# Patient Record
Sex: Male | Born: 1965 | Race: White | Hispanic: No | Marital: Single | Smoking: Former smoker
Health system: Southern US, Community
[De-identification: ages and names within clinical notes are randomized; demographics above are authoritative.]

## PROBLEM LIST (undated history)

## (undated) DIAGNOSIS — F329 Major depressive disorder, single episode, unspecified: Secondary | ICD-10-CM

## (undated) DIAGNOSIS — S62629A Displaced fracture of medial phalanx of unspecified finger, initial encounter for closed fracture: Secondary | ICD-10-CM

## (undated) DIAGNOSIS — K7030 Alcoholic cirrhosis of liver without ascites: Secondary | ICD-10-CM

## (undated) DIAGNOSIS — C4490 Unspecified malignant neoplasm of skin, unspecified: Secondary | ICD-10-CM

## (undated) DIAGNOSIS — F411 Generalized anxiety disorder: Secondary | ICD-10-CM

## (undated) DIAGNOSIS — N529 Male erectile dysfunction, unspecified: Secondary | ICD-10-CM

## (undated) DIAGNOSIS — T424X2A Poisoning by benzodiazepines, intentional self-harm, initial encounter: Secondary | ICD-10-CM

## (undated) DIAGNOSIS — K7290 Hepatic failure, unspecified without coma: Secondary | ICD-10-CM

## (undated) DIAGNOSIS — J45909 Unspecified asthma, uncomplicated: Secondary | ICD-10-CM

## (undated) DIAGNOSIS — E7220 Disorder of urea cycle metabolism, unspecified: Secondary | ICD-10-CM

## (undated) DIAGNOSIS — K219 Gastro-esophageal reflux disease without esophagitis: Secondary | ICD-10-CM

## (undated) DIAGNOSIS — S4990XA Unspecified injury of shoulder and upper arm, unspecified arm, initial encounter: Secondary | ICD-10-CM

## (undated) DIAGNOSIS — G9009 Other idiopathic peripheral autonomic neuropathy: Secondary | ICD-10-CM

## (undated) DIAGNOSIS — D509 Iron deficiency anemia, unspecified: Secondary | ICD-10-CM

## (undated) DIAGNOSIS — I8510 Secondary esophageal varices without bleeding: Secondary | ICD-10-CM

## (undated) DIAGNOSIS — L03115 Cellulitis of right lower limb: Secondary | ICD-10-CM

## (undated) DIAGNOSIS — IMO0002 Reserved for concepts with insufficient information to code with codable children: Secondary | ICD-10-CM

## (undated) DIAGNOSIS — I878 Other specified disorders of veins: Secondary | ICD-10-CM

## (undated) DIAGNOSIS — F332 Major depressive disorder, recurrent severe without psychotic features: Secondary | ICD-10-CM

## (undated) DIAGNOSIS — T7411XA Adult physical abuse, confirmed, initial encounter: Secondary | ICD-10-CM

## (undated) DIAGNOSIS — I10 Essential (primary) hypertension: Secondary | ICD-10-CM

## (undated) DIAGNOSIS — E669 Obesity, unspecified: Secondary | ICD-10-CM

## (undated) DIAGNOSIS — D759 Disease of blood and blood-forming organs, unspecified: Secondary | ICD-10-CM

## (undated) DIAGNOSIS — K652 Spontaneous bacterial peritonitis: Secondary | ICD-10-CM

## (undated) DIAGNOSIS — R45851 Suicidal ideations: Secondary | ICD-10-CM

## (undated) DIAGNOSIS — F04 Amnestic disorder due to known physiological condition: Secondary | ICD-10-CM

## (undated) DIAGNOSIS — F4321 Adjustment disorder with depressed mood: Secondary | ICD-10-CM

## (undated) DIAGNOSIS — I8511 Secondary esophageal varices with bleeding: Secondary | ICD-10-CM

## (undated) DIAGNOSIS — K3189 Other diseases of stomach and duodenum: Secondary | ICD-10-CM

## (undated) DIAGNOSIS — B20 Human immunodeficiency virus [HIV] disease: Secondary | ICD-10-CM

## (undated) DIAGNOSIS — K766 Portal hypertension: Secondary | ICD-10-CM

## (undated) DIAGNOSIS — I8500 Esophageal varices without bleeding: Secondary | ICD-10-CM

## (undated) DIAGNOSIS — R188 Other ascites: Secondary | ICD-10-CM

## (undated) DIAGNOSIS — F1020 Alcohol dependence, uncomplicated: Secondary | ICD-10-CM

## (undated) DIAGNOSIS — J320 Chronic maxillary sinusitis: Secondary | ICD-10-CM

## (undated) DIAGNOSIS — L089 Local infection of the skin and subcutaneous tissue, unspecified: Secondary | ICD-10-CM

## (undated) DIAGNOSIS — E785 Hyperlipidemia, unspecified: Secondary | ICD-10-CM

## (undated) HISTORY — DX: Disorder of urea cycle metabolism, unspecified: E72.20

## (undated) HISTORY — DX: Alcoholic cirrhosis of liver without ascites: K70.30

## (undated) HISTORY — DX: Cellulitis of right lower limb: L03.115

## (undated) HISTORY — DX: Unspecified asthma, uncomplicated: J45.909

## (undated) HISTORY — DX: Hepatic failure, unspecified without coma: K72.90

## (undated) HISTORY — DX: Local infection of the skin and subcutaneous tissue, unspecified: L08.9

## (undated) HISTORY — PX: CARPAL TUNNEL RELEASE: SHX101

## (undated) HISTORY — PX: ESOPHAGOGASTRODUODENOSCOPY: SHX1529

## (undated) HISTORY — DX: Reserved for concepts with insufficient information to code with codable children: IMO0002

## (undated) HISTORY — DX: Human immunodeficiency virus (HIV) disease: B20

## (undated) HISTORY — DX: Iron deficiency anemia, unspecified: D50.9

## (undated) HISTORY — DX: Displaced fracture of middle phalanx of unspecified finger, initial encounter for closed fracture: S62.629A

## (undated) HISTORY — DX: Major depressive disorder, single episode, unspecified: F32.9

## (undated) HISTORY — DX: Alcohol dependence, uncomplicated: F10.20

## (undated) HISTORY — DX: Unspecified malignant neoplasm of skin, unspecified: C44.90

## (undated) HISTORY — DX: Secondary esophageal varices with bleeding: I85.11

## (undated) HISTORY — DX: Other specified disorders of veins: I87.8

## (undated) HISTORY — DX: Unspecified injury of shoulder and upper arm, unspecified arm, initial encounter: S49.90XA

## (undated) HISTORY — DX: Esophageal varices without bleeding: I85.00

## (undated) HISTORY — DX: Amnestic disorder due to known physiological condition: F04

## (undated) HISTORY — DX: Essential (primary) hypertension: I10

## (undated) HISTORY — DX: Suicidal ideations: R45.851

## (undated) HISTORY — DX: Hyperlipidemia, unspecified: E78.5

## (undated) HISTORY — DX: Chronic maxillary sinusitis: J32.0

## (undated) HISTORY — DX: Poisoning by benzodiazepines, intentional self-harm, initial encounter: T42.4X2A

## (undated) HISTORY — DX: Major depressive disorder, recurrent severe without psychotic features: F33.2

## (undated) HISTORY — DX: Male erectile dysfunction, unspecified: N52.9

## (undated) HISTORY — DX: Obesity, unspecified: E66.9

## (undated) HISTORY — DX: Generalized anxiety disorder: F41.1

## (undated) HISTORY — PX: UPPER GASTROINTESTINAL ENDOSCOPY: SHX188

## (undated) HISTORY — DX: Other idiopathic peripheral autonomic neuropathy: G90.09

## (undated) HISTORY — DX: Secondary esophageal varices without bleeding: I85.10

---
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
## 2002-07-03 ENCOUNTER — Encounter (INDEPENDENT_AMBULATORY_CARE_PROVIDER_SITE_OTHER): Payer: Self-pay | Admitting: *Deleted

## 2002-07-03 LAB — CONVERTED CEMR LAB
CD4 Count: 250 microliters
CD4 T Cell Abs: 250

## 2002-10-04 ENCOUNTER — Ambulatory Visit (HOSPITAL_BASED_OUTPATIENT_CLINIC_OR_DEPARTMENT_OTHER): Admission: RE | Admit: 2002-10-04 | Discharge: 2002-10-04 | Payer: Self-pay | Admitting: Orthopedic Surgery

## 2003-04-16 ENCOUNTER — Encounter: Admission: RE | Admit: 2003-04-16 | Discharge: 2003-04-16 | Payer: Self-pay | Admitting: Infectious Diseases

## 2003-04-16 ENCOUNTER — Encounter: Payer: Self-pay | Admitting: Infectious Diseases

## 2003-04-16 ENCOUNTER — Ambulatory Visit (HOSPITAL_COMMUNITY): Admission: RE | Admit: 2003-04-16 | Discharge: 2003-04-16 | Payer: Self-pay | Admitting: Infectious Diseases

## 2003-05-14 ENCOUNTER — Encounter: Admission: RE | Admit: 2003-05-14 | Discharge: 2003-05-14 | Payer: Self-pay | Admitting: Infectious Diseases

## 2003-08-26 ENCOUNTER — Encounter: Admission: RE | Admit: 2003-08-26 | Discharge: 2003-08-26 | Payer: Self-pay | Admitting: Infectious Diseases

## 2003-08-26 ENCOUNTER — Ambulatory Visit (HOSPITAL_COMMUNITY): Admission: RE | Admit: 2003-08-26 | Discharge: 2003-08-26 | Payer: Self-pay | Admitting: Infectious Diseases

## 2003-10-01 ENCOUNTER — Encounter: Admission: RE | Admit: 2003-10-01 | Discharge: 2003-10-01 | Payer: Self-pay | Admitting: Internal Medicine

## 2003-11-21 ENCOUNTER — Encounter: Admission: RE | Admit: 2003-11-21 | Discharge: 2003-11-21 | Payer: Self-pay | Admitting: Infectious Diseases

## 2003-11-21 ENCOUNTER — Ambulatory Visit (HOSPITAL_COMMUNITY): Admission: RE | Admit: 2003-11-21 | Discharge: 2003-11-21 | Payer: Self-pay | Admitting: Infectious Diseases

## 2003-12-06 ENCOUNTER — Encounter: Admission: RE | Admit: 2003-12-06 | Discharge: 2003-12-06 | Payer: Self-pay | Admitting: Infectious Diseases

## 2003-12-23 ENCOUNTER — Ambulatory Visit (HOSPITAL_COMMUNITY): Admission: RE | Admit: 2003-12-23 | Discharge: 2003-12-23 | Payer: Self-pay | Admitting: Infectious Diseases

## 2004-02-10 ENCOUNTER — Encounter: Admission: RE | Admit: 2004-02-10 | Discharge: 2004-02-10 | Payer: Self-pay | Admitting: Infectious Diseases

## 2004-02-10 ENCOUNTER — Ambulatory Visit (HOSPITAL_COMMUNITY): Admission: RE | Admit: 2004-02-10 | Discharge: 2004-02-10 | Payer: Self-pay | Admitting: Infectious Diseases

## 2004-02-20 ENCOUNTER — Ambulatory Visit: Payer: Self-pay | Admitting: Infectious Diseases

## 2004-04-15 ENCOUNTER — Ambulatory Visit: Payer: Self-pay | Admitting: Infectious Diseases

## 2004-04-15 ENCOUNTER — Ambulatory Visit (HOSPITAL_COMMUNITY): Admission: RE | Admit: 2004-04-15 | Discharge: 2004-04-15 | Payer: Self-pay | Admitting: Infectious Diseases

## 2004-05-06 ENCOUNTER — Ambulatory Visit: Payer: Self-pay | Admitting: Internal Medicine

## 2004-05-15 ENCOUNTER — Ambulatory Visit: Payer: Self-pay | Admitting: Infectious Diseases

## 2004-06-22 ENCOUNTER — Ambulatory Visit: Payer: Self-pay | Admitting: Internal Medicine

## 2004-07-24 ENCOUNTER — Ambulatory Visit: Payer: Self-pay | Admitting: Infectious Diseases

## 2004-08-17 ENCOUNTER — Ambulatory Visit: Payer: Self-pay | Admitting: Internal Medicine

## 2004-08-19 ENCOUNTER — Ambulatory Visit (HOSPITAL_COMMUNITY): Admission: RE | Admit: 2004-08-19 | Discharge: 2004-08-19 | Payer: Self-pay | Admitting: Infectious Diseases

## 2004-08-19 ENCOUNTER — Ambulatory Visit: Payer: Self-pay | Admitting: Infectious Diseases

## 2004-10-01 ENCOUNTER — Ambulatory Visit: Payer: Self-pay | Admitting: Infectious Diseases

## 2004-10-16 ENCOUNTER — Ambulatory Visit: Payer: Self-pay | Admitting: Internal Medicine

## 2005-01-20 ENCOUNTER — Ambulatory Visit (HOSPITAL_COMMUNITY): Admission: RE | Admit: 2005-01-20 | Discharge: 2005-01-20 | Payer: Self-pay | Admitting: Infectious Diseases

## 2005-01-20 ENCOUNTER — Ambulatory Visit: Payer: Self-pay | Admitting: Infectious Diseases

## 2005-01-25 ENCOUNTER — Ambulatory Visit: Payer: Self-pay | Admitting: Internal Medicine

## 2005-02-05 ENCOUNTER — Ambulatory Visit: Payer: Self-pay | Admitting: Infectious Diseases

## 2005-02-19 ENCOUNTER — Ambulatory Visit: Payer: Self-pay | Admitting: Internal Medicine

## 2005-04-08 ENCOUNTER — Ambulatory Visit: Payer: Self-pay | Admitting: Internal Medicine

## 2005-04-12 ENCOUNTER — Ambulatory Visit (HOSPITAL_COMMUNITY): Admission: RE | Admit: 2005-04-12 | Discharge: 2005-04-12 | Payer: Self-pay | Admitting: Infectious Diseases

## 2005-04-12 ENCOUNTER — Ambulatory Visit: Payer: Self-pay | Admitting: Infectious Diseases

## 2005-05-11 ENCOUNTER — Ambulatory Visit: Payer: Self-pay | Admitting: Infectious Diseases

## 2005-05-12 ENCOUNTER — Ambulatory Visit: Payer: Self-pay | Admitting: Internal Medicine

## 2005-06-04 ENCOUNTER — Ambulatory Visit: Payer: Self-pay | Admitting: Family Medicine

## 2005-08-09 ENCOUNTER — Ambulatory Visit: Payer: Self-pay | Admitting: Internal Medicine

## 2005-08-16 ENCOUNTER — Ambulatory Visit: Payer: Self-pay | Admitting: Internal Medicine

## 2005-09-02 ENCOUNTER — Encounter (INDEPENDENT_AMBULATORY_CARE_PROVIDER_SITE_OTHER): Payer: Self-pay | Admitting: *Deleted

## 2005-09-02 ENCOUNTER — Ambulatory Visit: Payer: Self-pay | Admitting: Infectious Diseases

## 2005-09-02 ENCOUNTER — Encounter: Admission: RE | Admit: 2005-09-02 | Discharge: 2005-09-02 | Payer: Self-pay | Admitting: Infectious Diseases

## 2005-09-02 LAB — CONVERTED CEMR LAB
CD4 Count: 410 microliters
HIV 1 RNA Quant: 399 copies/mL

## 2005-09-14 ENCOUNTER — Encounter: Payer: Self-pay | Admitting: Internal Medicine

## 2005-09-14 ENCOUNTER — Ambulatory Visit: Payer: Self-pay | Admitting: Internal Medicine

## 2005-10-19 ENCOUNTER — Ambulatory Visit: Payer: Self-pay | Admitting: Internal Medicine

## 2005-10-28 ENCOUNTER — Ambulatory Visit: Payer: Self-pay | Admitting: Infectious Diseases

## 2006-01-04 ENCOUNTER — Ambulatory Visit: Payer: Self-pay | Admitting: Internal Medicine

## 2006-01-12 ENCOUNTER — Encounter (INDEPENDENT_AMBULATORY_CARE_PROVIDER_SITE_OTHER): Payer: Self-pay | Admitting: *Deleted

## 2006-01-12 ENCOUNTER — Encounter: Admission: RE | Admit: 2006-01-12 | Discharge: 2006-01-12 | Payer: Self-pay | Admitting: Infectious Diseases

## 2006-01-12 ENCOUNTER — Ambulatory Visit: Payer: Self-pay | Admitting: Infectious Diseases

## 2006-01-12 LAB — CONVERTED CEMR LAB
CD4 Count: 380 microliters
HIV 1 RNA Quant: 531 copies/mL

## 2006-01-27 ENCOUNTER — Ambulatory Visit: Payer: Self-pay | Admitting: Infectious Diseases

## 2006-02-24 ENCOUNTER — Ambulatory Visit: Payer: Self-pay | Admitting: Internal Medicine

## 2006-03-29 ENCOUNTER — Ambulatory Visit: Payer: Self-pay | Admitting: Internal Medicine

## 2006-03-31 ENCOUNTER — Ambulatory Visit: Payer: Self-pay | Admitting: Internal Medicine

## 2006-04-25 ENCOUNTER — Ambulatory Visit: Payer: Self-pay | Admitting: Internal Medicine

## 2006-05-09 ENCOUNTER — Ambulatory Visit: Payer: Self-pay | Admitting: Infectious Diseases

## 2006-05-09 ENCOUNTER — Encounter: Admission: RE | Admit: 2006-05-09 | Discharge: 2006-05-09 | Payer: Self-pay | Admitting: Infectious Diseases

## 2006-05-09 ENCOUNTER — Encounter (INDEPENDENT_AMBULATORY_CARE_PROVIDER_SITE_OTHER): Payer: Self-pay | Admitting: *Deleted

## 2006-05-09 LAB — CONVERTED CEMR LAB
ALT: 29 units/L (ref 0–53)
AST: 20 units/L (ref 0–37)
Albumin: 4.8 g/dL (ref 3.5–5.2)
Alkaline Phosphatase: 95 units/L (ref 39–117)
BUN: 17 mg/dL (ref 6–23)
Basophils Absolute: 0 10*3/uL (ref 0.0–0.1)
Basophils Relative: 1 % (ref 0–1)
CD4 Count: 450 microliters
CO2: 21 meq/L (ref 19–32)
Calcium: 9.2 mg/dL (ref 8.4–10.5)
Chloride: 102 meq/L (ref 96–112)
Creatinine, Ser: 0.98 mg/dL (ref 0.40–1.50)
Eosinophils Relative: 2 % (ref 0–4)
Glucose, Bld: 82 mg/dL (ref 70–99)
HCT: 45.6 % (ref 41.0–49.0)
HIV 1 RNA Quant: 228 copies/mL
HIV 1 RNA Quant: 228 copies/mL — ABNORMAL HIGH (ref <50)
HIV-1 RNA Quant, Log: 2.36 — ABNORMAL HIGH (ref <1.70)
Hemoglobin: 15.8 g/dL (ref 13.9–16.8)
Lymphocytes Relative: 30 % (ref 15–43)
Lymphs Abs: 1.7 10*3/uL (ref 0.8–3.1)
MCHC: 34.6 g/dL (ref 33.1–35.4)
MCV: 90.8 fL (ref 78.8–100.0)
Monocytes Absolute: 0.5 10*3/uL (ref 0.2–0.7)
Monocytes Relative: 9 % (ref 3–11)
Neutro Abs: 3.2 10*3/uL (ref 1.8–6.8)
Neutrophils Relative %: 58 % (ref 47–77)
Platelets: 191 10*3/uL (ref 152–374)
Potassium: 4.2 meq/L (ref 3.5–5.3)
RBC: 5.02 M/uL (ref 4.20–5.50)
RDW: 12.9 % (ref 11.5–15.3)
Sodium: 137 meq/L (ref 135–145)
Total Bilirubin: 0.5 mg/dL (ref 0.3–1.2)
Total Protein: 6.8 g/dL (ref 6.0–8.3)
WBC: 5.5 10*3/uL (ref 3.7–10.0)

## 2006-05-26 ENCOUNTER — Ambulatory Visit: Payer: Self-pay | Admitting: Infectious Diseases

## 2006-06-23 DIAGNOSIS — B20 Human immunodeficiency virus [HIV] disease: Secondary | ICD-10-CM | POA: Insufficient documentation

## 2006-06-23 DIAGNOSIS — F411 Generalized anxiety disorder: Secondary | ICD-10-CM

## 2006-06-23 DIAGNOSIS — F32A Depression, unspecified: Secondary | ICD-10-CM

## 2006-06-23 DIAGNOSIS — K2100 Gastro-esophageal reflux disease with esophagitis, without bleeding: Secondary | ICD-10-CM

## 2006-06-23 DIAGNOSIS — E785 Hyperlipidemia, unspecified: Secondary | ICD-10-CM | POA: Insufficient documentation

## 2006-06-23 DIAGNOSIS — I10 Essential (primary) hypertension: Secondary | ICD-10-CM

## 2006-06-23 DIAGNOSIS — F329 Major depressive disorder, single episode, unspecified: Secondary | ICD-10-CM

## 2006-06-23 HISTORY — DX: Human immunodeficiency virus (HIV) disease: B20

## 2006-06-23 HISTORY — DX: Hyperlipidemia, unspecified: E78.5

## 2006-06-23 HISTORY — DX: Generalized anxiety disorder: F41.1

## 2006-06-23 HISTORY — DX: Essential (primary) hypertension: I10

## 2006-06-23 HISTORY — DX: Major depressive disorder, single episode, unspecified: F32.9

## 2006-06-23 HISTORY — DX: Depression, unspecified: F32.A

## 2006-06-23 HISTORY — DX: Gastro-esophageal reflux disease with esophagitis, without bleeding: K21.00

## 2006-06-27 ENCOUNTER — Ambulatory Visit: Payer: Self-pay | Admitting: Internal Medicine

## 2006-07-11 ENCOUNTER — Encounter: Payer: Self-pay | Admitting: Infectious Diseases

## 2006-08-08 ENCOUNTER — Encounter (INDEPENDENT_AMBULATORY_CARE_PROVIDER_SITE_OTHER): Payer: Self-pay | Admitting: *Deleted

## 2006-08-08 LAB — CONVERTED CEMR LAB

## 2006-08-21 ENCOUNTER — Encounter (INDEPENDENT_AMBULATORY_CARE_PROVIDER_SITE_OTHER): Payer: Self-pay | Admitting: *Deleted

## 2006-08-25 ENCOUNTER — Ambulatory Visit: Payer: Self-pay | Admitting: Infectious Diseases

## 2006-08-25 ENCOUNTER — Encounter: Admission: RE | Admit: 2006-08-25 | Discharge: 2006-08-25 | Payer: Self-pay | Admitting: Infectious Diseases

## 2006-09-08 ENCOUNTER — Ambulatory Visit: Payer: Self-pay | Admitting: Infectious Diseases

## 2006-09-09 ENCOUNTER — Encounter: Payer: Self-pay | Admitting: Infectious Diseases

## 2006-09-19 ENCOUNTER — Ambulatory Visit: Payer: Self-pay | Admitting: Internal Medicine

## 2006-10-10 ENCOUNTER — Ambulatory Visit: Payer: Self-pay | Admitting: Internal Medicine

## 2006-11-09 ENCOUNTER — Telehealth: Payer: Self-pay | Admitting: Infectious Diseases

## 2007-03-06 ENCOUNTER — Ambulatory Visit: Payer: Self-pay | Admitting: Internal Medicine

## 2007-03-06 DIAGNOSIS — J320 Chronic maxillary sinusitis: Secondary | ICD-10-CM | POA: Insufficient documentation

## 2007-03-06 HISTORY — DX: Chronic maxillary sinusitis: J32.0

## 2007-03-15 DIAGNOSIS — IMO0002 Reserved for concepts with insufficient information to code with codable children: Secondary | ICD-10-CM

## 2007-03-15 HISTORY — DX: Reserved for concepts with insufficient information to code with codable children: IMO0002

## 2007-03-20 ENCOUNTER — Ambulatory Visit: Payer: Self-pay | Admitting: Internal Medicine

## 2007-04-21 ENCOUNTER — Ambulatory Visit: Payer: Self-pay | Admitting: Family Medicine

## 2007-04-22 ENCOUNTER — Emergency Department (HOSPITAL_COMMUNITY): Admission: EM | Admit: 2007-04-22 | Discharge: 2007-04-22 | Payer: Self-pay | Admitting: Family Medicine

## 2007-05-08 ENCOUNTER — Encounter: Admission: RE | Admit: 2007-05-08 | Discharge: 2007-05-08 | Payer: Self-pay | Admitting: Infectious Diseases

## 2007-05-08 ENCOUNTER — Ambulatory Visit: Payer: Self-pay | Admitting: Infectious Diseases

## 2007-05-08 LAB — CONVERTED CEMR LAB
ALT: 34 units/L (ref 0–53)
AST: 23 units/L (ref 0–37)
Albumin: 4.6 g/dL (ref 3.5–5.2)
Alkaline Phosphatase: 72 units/L (ref 39–117)
BUN: 14 mg/dL (ref 6–23)
Basophils Absolute: 0.1 10*3/uL (ref 0.0–0.1)
Basophils Relative: 1 % (ref 0–1)
CO2: 22 meq/L (ref 19–32)
Calcium: 9.7 mg/dL (ref 8.4–10.5)
Chloride: 104 meq/L (ref 96–112)
Creatinine, Ser: 0.88 mg/dL (ref 0.40–1.50)
Eosinophils Absolute: 0.1 10*3/uL — ABNORMAL LOW (ref 0.2–0.7)
Eosinophils Relative: 3 % (ref 0–5)
Glucose, Bld: 116 mg/dL — ABNORMAL HIGH (ref 70–99)
HCT: 50.2 % (ref 39.0–52.0)
HIV 1 RNA Quant: 147 copies/mL — ABNORMAL HIGH (ref <50)
HIV-1 RNA Quant, Log: 2.17 — ABNORMAL HIGH (ref <1.70)
Hemoglobin: 16.7 g/dL (ref 13.0–17.0)
Lymphocytes Relative: 37 % (ref 12–46)
Lymphs Abs: 1.8 10*3/uL (ref 0.7–4.0)
MCHC: 33.3 g/dL (ref 30.0–36.0)
MCV: 92.8 fL (ref 78.0–100.0)
Monocytes Absolute: 0.3 10*3/uL (ref 0.1–1.0)
Monocytes Relative: 6 % (ref 3–12)
Neutro Abs: 2.6 10*3/uL (ref 1.7–7.7)
Neutrophils Relative %: 53 % (ref 43–77)
Platelets: 215 10*3/uL (ref 150–400)
Potassium: 4.3 meq/L (ref 3.5–5.3)
RBC: 5.41 M/uL (ref 4.22–5.81)
RDW: 13.5 % (ref 11.5–15.5)
Sodium: 141 meq/L (ref 135–145)
Total Bilirubin: 0.4 mg/dL (ref 0.3–1.2)
Total Protein: 7.3 g/dL (ref 6.0–8.3)
WBC: 4.9 10*3/uL (ref 4.0–10.5)

## 2007-05-25 ENCOUNTER — Ambulatory Visit: Payer: Self-pay | Admitting: Infectious Diseases

## 2007-05-25 DIAGNOSIS — G9009 Other idiopathic peripheral autonomic neuropathy: Secondary | ICD-10-CM | POA: Insufficient documentation

## 2007-05-25 HISTORY — DX: Other idiopathic peripheral autonomic neuropathy: G90.09

## 2007-06-13 ENCOUNTER — Encounter: Payer: Self-pay | Admitting: Infectious Diseases

## 2007-06-19 ENCOUNTER — Telehealth: Payer: Self-pay | Admitting: Internal Medicine

## 2007-09-11 ENCOUNTER — Encounter (INDEPENDENT_AMBULATORY_CARE_PROVIDER_SITE_OTHER): Payer: Self-pay | Admitting: *Deleted

## 2007-09-26 ENCOUNTER — Encounter (INDEPENDENT_AMBULATORY_CARE_PROVIDER_SITE_OTHER): Payer: Self-pay | Admitting: *Deleted

## 2007-10-31 ENCOUNTER — Telehealth: Payer: Self-pay | Admitting: Internal Medicine

## 2007-12-04 ENCOUNTER — Ambulatory Visit: Payer: Self-pay | Admitting: Internal Medicine

## 2007-12-05 ENCOUNTER — Encounter: Admission: RE | Admit: 2007-12-05 | Discharge: 2007-12-05 | Payer: Self-pay | Admitting: Infectious Diseases

## 2007-12-05 ENCOUNTER — Ambulatory Visit: Payer: Self-pay | Admitting: Infectious Diseases

## 2007-12-05 LAB — CONVERTED CEMR LAB
AST: 113 units/L — ABNORMAL HIGH (ref 0–37)
Albumin: 4.3 g/dL (ref 3.5–5.2)
BUN: 15 mg/dL (ref 6–23)
Calcium: 8.9 mg/dL (ref 8.4–10.5)
Chloride: 105 meq/L (ref 96–112)
Glucose, Bld: 103 mg/dL — ABNORMAL HIGH (ref 70–99)
HIV 1 RNA Quant: 344 copies/mL — ABNORMAL HIGH (ref <50)
Lymphs Abs: 1 10*3/uL (ref 0.7–4.0)
Monocytes Relative: 11 % (ref 3–12)
Neutro Abs: 4.8 10*3/uL (ref 1.7–7.7)
Neutrophils Relative %: 72 % (ref 43–77)
Potassium: 4.9 meq/L (ref 3.5–5.3)
RBC: 4.77 M/uL (ref 4.22–5.81)
Total Protein: 7.3 g/dL (ref 6.0–8.3)
WBC: 6.6 10*3/uL (ref 4.0–10.5)

## 2007-12-21 ENCOUNTER — Ambulatory Visit: Payer: Self-pay | Admitting: Infectious Diseases

## 2008-05-16 ENCOUNTER — Encounter (INDEPENDENT_AMBULATORY_CARE_PROVIDER_SITE_OTHER): Payer: Self-pay | Admitting: *Deleted

## 2008-06-27 ENCOUNTER — Ambulatory Visit: Payer: Self-pay | Admitting: Internal Medicine

## 2008-07-11 ENCOUNTER — Ambulatory Visit: Payer: Self-pay | Admitting: Infectious Diseases

## 2008-07-11 LAB — CONVERTED CEMR LAB
ALT: 68 units/L — ABNORMAL HIGH (ref 0–53)
AST: 152 units/L — ABNORMAL HIGH (ref 0–37)
Albumin: 4.6 g/dL (ref 3.5–5.2)
Alkaline Phosphatase: 108 units/L (ref 39–117)
BUN: 12 mg/dL (ref 6–23)
Basophils Absolute: 0.1 10*3/uL (ref 0.0–0.1)
Basophils Relative: 1 % (ref 0–1)
Creatinine, Ser: 0.67 mg/dL (ref 0.40–1.50)
Eosinophils Absolute: 0.1 10*3/uL (ref 0.0–0.7)
Glucose, Bld: 100 mg/dL — ABNORMAL HIGH (ref 70–99)
HDL: 56 mg/dL (ref >39)
HIV 1 RNA Quant: 142 copies/mL — ABNORMAL HIGH (ref <48)
HIV-1 RNA Quant, Log: 2.15 — ABNORMAL HIGH (ref <1.68)
LDL Cholesterol: 132 mg/dL — ABNORMAL HIGH (ref 0–99)
Lymphocytes Relative: 18 % (ref 12–46)
Lymphs Abs: 1.1 10*3/uL (ref 0.7–4.0)
MCV: 94.1 fL (ref 78.0–100.0)
Monocytes Relative: 9 % (ref 3–12)
Neutrophils Relative %: 70 % (ref 43–77)
Platelets: 117 10*3/uL — ABNORMAL LOW (ref 150–400)
Potassium: 4.1 meq/L (ref 3.5–5.3)
RBC: 4.94 M/uL (ref 4.22–5.81)
RDW: 15.9 % — ABNORMAL HIGH (ref 11.5–15.5)
Sodium: 141 meq/L (ref 135–145)
Total Bilirubin: 1 mg/dL (ref 0.3–1.2)
Total Protein: 8.1 g/dL (ref 6.0–8.3)
VLDL: 32 mg/dL (ref 0–40)
WBC: 6.3 10*3/uL (ref 4.0–10.5)

## 2008-07-12 ENCOUNTER — Telehealth: Payer: Self-pay | Admitting: Infectious Diseases

## 2008-07-25 ENCOUNTER — Ambulatory Visit: Payer: Self-pay | Admitting: Infectious Diseases

## 2008-08-20 ENCOUNTER — Telehealth: Payer: Self-pay | Admitting: Internal Medicine

## 2008-08-23 ENCOUNTER — Ambulatory Visit: Payer: Self-pay | Admitting: Internal Medicine

## 2008-09-04 ENCOUNTER — Encounter (INDEPENDENT_AMBULATORY_CARE_PROVIDER_SITE_OTHER): Payer: Self-pay | Admitting: *Deleted

## 2008-10-17 ENCOUNTER — Encounter (INDEPENDENT_AMBULATORY_CARE_PROVIDER_SITE_OTHER): Payer: Self-pay | Admitting: *Deleted

## 2009-01-27 ENCOUNTER — Telehealth: Payer: Self-pay | Admitting: *Deleted

## 2009-02-03 ENCOUNTER — Ambulatory Visit: Payer: Self-pay | Admitting: Internal Medicine

## 2009-02-10 ENCOUNTER — Telehealth: Payer: Self-pay | Admitting: Internal Medicine

## 2009-02-12 ENCOUNTER — Ambulatory Visit: Payer: Self-pay | Admitting: Infectious Diseases

## 2009-02-12 LAB — CONVERTED CEMR LAB
ALT: 37 units/L (ref 0–53)
AST: 131 units/L — ABNORMAL HIGH (ref 0–37)
Alkaline Phosphatase: 195 units/L — ABNORMAL HIGH (ref 39–117)
Basophils Relative: 1 % (ref 0–1)
Creatinine, Ser: 0.65 mg/dL (ref 0.40–1.50)
Eosinophils Absolute: 0.2 10*3/uL (ref 0.0–0.7)
Eosinophils Relative: 3 % (ref 0–5)
HCT: 39 % (ref 39.0–52.0)
Lymphs Abs: 1.3 10*3/uL (ref 0.7–4.0)
MCHC: 34.4 g/dL (ref 30.0–36.0)
MCV: 96.1 fL (ref >=78.0)
Monocytes Relative: 12 % (ref 3–12)
Neutrophils Relative %: 65 % (ref 43–77)
Platelets: 97 10*3/uL — ABNORMAL LOW (ref 150–400)
RDW: 16.4 % — ABNORMAL HIGH (ref 11.5–15.5)
Sodium: 138 meq/L (ref 135–145)
Total Bilirubin: 2.5 mg/dL — ABNORMAL HIGH (ref 0.3–1.2)
Total Protein: 7.9 g/dL (ref 6.0–8.3)

## 2009-02-27 ENCOUNTER — Ambulatory Visit: Payer: Self-pay | Admitting: Infectious Diseases

## 2009-02-27 LAB — CONVERTED CEMR LAB
HCV Ab: NEGATIVE
Hep A Total Ab: POSITIVE — AB
Hep B S Ab: POSITIVE — AB
Hepatitis B Surface Ag: NEGATIVE

## 2009-05-02 ENCOUNTER — Ambulatory Visit: Payer: Self-pay | Admitting: Internal Medicine

## 2009-07-11 ENCOUNTER — Ambulatory Visit: Payer: Self-pay | Admitting: Internal Medicine

## 2009-07-11 DIAGNOSIS — N529 Male erectile dysfunction, unspecified: Secondary | ICD-10-CM | POA: Insufficient documentation

## 2009-07-11 HISTORY — DX: Male erectile dysfunction, unspecified: N52.9

## 2009-07-16 LAB — CONVERTED CEMR LAB: Testosterone: 349.65 ng/dL — ABNORMAL LOW (ref 350.00–890.00)

## 2009-07-30 ENCOUNTER — Ambulatory Visit: Payer: Self-pay | Admitting: Infectious Diseases

## 2009-07-30 LAB — CONVERTED CEMR LAB
ALT: 29 units/L (ref 0–53)
AST: 106 units/L — ABNORMAL HIGH (ref 0–37)
Albumin: 3.6 g/dL (ref 3.5–5.2)
Alkaline Phosphatase: 117 units/L (ref 39–117)
BUN: 8 mg/dL (ref 6–23)
Basophils Absolute: 0.1 10*3/uL (ref 0.0–0.1)
CO2: 22 meq/L (ref 19–32)
Calcium: 8.3 mg/dL — ABNORMAL LOW (ref 8.4–10.5)
Chloride: 107 meq/L (ref 96–112)
Creatinine, Ser: 0.61 mg/dL (ref 0.40–1.50)
Eosinophils Absolute: 0.2 10*3/uL (ref 0.0–0.7)
HCT: 39.6 % (ref 39.0–52.0)
HDL: 30 mg/dL — ABNORMAL LOW (ref >39)
HIV 1 RNA Quant: 229 copies/mL — ABNORMAL HIGH (ref <48)
Hemoglobin: 13.6 g/dL (ref 13.0–17.0)
LDL Cholesterol: 140 mg/dL — ABNORMAL HIGH (ref 0–99)
Lymphocytes Relative: 29 % (ref 12–46)
Lymphs Abs: 1.6 10*3/uL (ref 0.7–4.0)
Monocytes Relative: 14 % — ABNORMAL HIGH (ref 3–12)
Neutro Abs: 3 10*3/uL (ref 1.7–7.7)
Neutrophils Relative %: 53 % (ref 43–77)
Platelets: 63 10*3/uL — ABNORMAL LOW (ref 150–400)
Potassium: 3.8 meq/L (ref 3.5–5.3)
RDW: 14.4 % (ref 11.5–15.5)
Sodium: 137 meq/L (ref 135–145)
Total Protein: 7.7 g/dL (ref 6.0–8.3)
WBC: 5.7 10*3/uL (ref 4.0–10.5)

## 2009-08-08 ENCOUNTER — Telehealth (INDEPENDENT_AMBULATORY_CARE_PROVIDER_SITE_OTHER): Payer: Self-pay | Admitting: *Deleted

## 2009-08-27 ENCOUNTER — Ambulatory Visit: Payer: Self-pay | Admitting: Internal Medicine

## 2009-09-01 ENCOUNTER — Telehealth: Payer: Self-pay | Admitting: Internal Medicine

## 2009-11-06 ENCOUNTER — Ambulatory Visit: Payer: Self-pay | Admitting: Internal Medicine

## 2009-11-06 ENCOUNTER — Telehealth: Payer: Self-pay | Admitting: Internal Medicine

## 2009-11-11 ENCOUNTER — Telehealth: Payer: Self-pay | Admitting: *Deleted

## 2009-11-11 LAB — CONVERTED CEMR LAB
AST: 102 units/L — ABNORMAL HIGH (ref 0–37)
Albumin: 3.2 g/dL — ABNORMAL LOW (ref 3.5–5.2)
Alkaline Phosphatase: 104 units/L (ref 39–117)
Basophils Absolute: 0.3 10*3/uL — ABNORMAL HIGH (ref 0.0–0.1)
Bilirubin, Direct: 0.9 mg/dL — ABNORMAL HIGH (ref 0.0–0.3)
Calcium: 8.2 mg/dL — ABNORMAL LOW (ref 8.4–10.5)
Creatinine, Ser: 0.7 mg/dL (ref 0.4–1.5)
Eosinophils Absolute: 0.2 10*3/uL (ref 0.0–0.7)
Eosinophils Relative: 2.1 % (ref 0.0–5.0)
GFR calc non Af Amer: 131.78 mL/min (ref >60)
Glucose, Bld: 135 mg/dL — ABNORMAL HIGH (ref 70–99)
HCT: 29.6 % — ABNORMAL LOW (ref 39.0–52.0)
Hemoglobin: 9.6 g/dL — ABNORMAL LOW (ref 13.0–17.0)
Lymphocytes Relative: 22 % (ref 12.0–46.0)
MCHC: 32.4 g/dL (ref 30.0–36.0)
Monocytes Relative: 8.8 % (ref 3.0–12.0)
Neutro Abs: 4.6 10*3/uL (ref 1.4–7.7)
Platelets: 90 10*3/uL — ABNORMAL LOW (ref 150.0–400.0)
Potassium: 3.7 meq/L (ref 3.5–5.1)
RDW: 17.7 % — ABNORMAL HIGH (ref 11.5–14.6)
Sodium: 142 meq/L (ref 135–145)
Total Bilirubin: 2.6 mg/dL — ABNORMAL HIGH (ref 0.3–1.2)
WBC: 7.3 10*3/uL (ref 4.5–10.5)

## 2009-11-12 ENCOUNTER — Ambulatory Visit: Payer: Self-pay | Admitting: Internal Medicine

## 2009-11-13 ENCOUNTER — Ambulatory Visit: Payer: Self-pay | Admitting: Internal Medicine

## 2009-11-13 DIAGNOSIS — R188 Other ascites: Secondary | ICD-10-CM

## 2009-11-13 LAB — CONVERTED CEMR LAB
BUN: 8 mg/dL (ref 6–23)
Basophils Relative: 1.5 % (ref 0.0–3.0)
Bilirubin, Direct: 1 mg/dL — ABNORMAL HIGH (ref 0.0–0.3)
CO2: 25 meq/L (ref 19–32)
Eosinophils Absolute: 0.1 10*3/uL (ref 0.0–0.7)
GFR calc non Af Amer: 151.91 mL/min (ref >60)
GGT: 244 units/L — ABNORMAL HIGH (ref 7–51)
Glucose, Bld: 100 mg/dL — ABNORMAL HIGH (ref 70–99)
HCT: 28.5 % — ABNORMAL LOW (ref 39.0–52.0)
Hemoglobin: 9.7 g/dL — ABNORMAL LOW (ref 13.0–17.0)
Lipase: 54 units/L (ref 11.0–59.0)
Lymphocytes Relative: 31.1 % (ref 12.0–46.0)
MCHC: 34.1 g/dL (ref 30.0–36.0)
MCV: 97.9 fL (ref 78.0–100.0)
Monocytes Absolute: 0.8 10*3/uL (ref 0.1–1.0)
Neutro Abs: 3.2 10*3/uL (ref 1.4–7.7)
Potassium: 3.6 meq/L (ref 3.5–5.1)
RBC: 2.92 M/uL — ABNORMAL LOW (ref 4.22–5.81)
Saturation Ratios: 6.8 % — ABNORMAL LOW (ref 20.0–50.0)
Total Bilirubin: 2.8 mg/dL — ABNORMAL HIGH (ref 0.3–1.2)
Vitamin B-12: 1355 pg/mL — ABNORMAL HIGH (ref 211–911)

## 2009-11-17 ENCOUNTER — Ambulatory Visit: Payer: Self-pay | Admitting: Internal Medicine

## 2009-11-17 LAB — CONVERTED CEMR LAB
AST: 97 units/L — ABNORMAL HIGH (ref 0–37)
Albumin: 3.3 g/dL — ABNORMAL LOW (ref 3.5–5.2)

## 2009-11-26 ENCOUNTER — Telehealth: Payer: Self-pay | Admitting: Internal Medicine

## 2009-11-26 ENCOUNTER — Emergency Department (HOSPITAL_COMMUNITY): Admission: EM | Admit: 2009-11-26 | Discharge: 2009-11-27 | Payer: Self-pay | Admitting: Emergency Medicine

## 2009-12-16 ENCOUNTER — Telehealth: Payer: Self-pay | Admitting: Internal Medicine

## 2009-12-26 ENCOUNTER — Ambulatory Visit: Payer: Self-pay | Admitting: Internal Medicine

## 2010-01-28 ENCOUNTER — Ambulatory Visit: Payer: Self-pay | Admitting: Internal Medicine

## 2010-01-29 LAB — CONVERTED CEMR LAB
AST: 62 units/L — ABNORMAL HIGH (ref 0–37)
Bilirubin, Direct: 1.9 mg/dL — ABNORMAL HIGH (ref 0.0–0.3)
HCT: 29.4 % — ABNORMAL LOW (ref 39.0–52.0)
MCHC: 35.1 g/dL (ref 30.0–36.0)
MCV: 95 fL (ref 78.0–100.0)
Platelets: 112 10*3/uL — ABNORMAL LOW (ref 150.0–400.0)
RBC: 3.1 M/uL — ABNORMAL LOW (ref 4.22–5.81)
RDW: 25.9 % — ABNORMAL HIGH (ref 11.5–14.6)
Total Bilirubin: 5 mg/dL — ABNORMAL HIGH (ref 0.3–1.2)
WBC: 6.1 10*3/uL (ref 4.5–10.5)

## 2010-01-30 ENCOUNTER — Encounter: Admission: RE | Admit: 2010-01-30 | Discharge: 2010-01-30 | Payer: Self-pay | Admitting: Internal Medicine

## 2010-02-02 ENCOUNTER — Telehealth: Payer: Self-pay | Admitting: Internal Medicine

## 2010-02-02 ENCOUNTER — Encounter: Payer: Self-pay | Admitting: Infectious Diseases

## 2010-02-02 ENCOUNTER — Encounter: Payer: Self-pay | Admitting: Internal Medicine

## 2010-02-04 ENCOUNTER — Telehealth: Payer: Self-pay | Admitting: Infectious Diseases

## 2010-02-12 ENCOUNTER — Encounter: Payer: Self-pay | Admitting: Internal Medicine

## 2010-02-12 ENCOUNTER — Ambulatory Visit: Payer: Self-pay | Admitting: Infectious Diseases

## 2010-02-12 LAB — CONVERTED CEMR LAB
ALT: 41 units/L (ref 0–53)
Alkaline Phosphatase: 129 units/L — ABNORMAL HIGH (ref 39–117)
BUN: 15 mg/dL (ref 6–23)
Basophils Absolute: 0.1 10*3/uL (ref 0.0–0.1)
Basophils Relative: 1 % (ref 0–1)
CO2: 21 meq/L (ref 19–32)
Eosinophils Absolute: 0.1 10*3/uL (ref 0.0–0.7)
Eosinophils Relative: 2 % (ref 0–5)
HCT: 31.2 % — ABNORMAL LOW (ref 39.0–52.0)
HIV-1 RNA Quant, Log: <1.68 (ref <1.68)
Lymphs Abs: 1.3 10*3/uL (ref 0.7–4.0)
MCHC: 35.6 g/dL (ref 30.0–36.0)
MCV: 91.2 fL (ref 78.0–100.0)
Neutro Abs: 3.9 10*3/uL (ref 1.7–7.7)
Neutrophils Relative %: 64 % (ref 43–77)
Platelets: 108 10*3/uL — ABNORMAL LOW (ref 150–400)
Potassium: 4.7 meq/L (ref 3.5–5.3)
RDW: 20.3 % — ABNORMAL HIGH (ref 11.5–15.5)
Sodium: 127 meq/L — ABNORMAL LOW (ref 135–145)
Total Bilirubin: 5.3 mg/dL — ABNORMAL HIGH (ref 0.3–1.2)
Total Protein: 6.8 g/dL (ref 6.0–8.3)
WBC: 6 10*3/uL (ref 4.0–10.5)

## 2010-02-25 ENCOUNTER — Telehealth: Payer: Self-pay | Admitting: Internal Medicine

## 2010-02-26 ENCOUNTER — Ambulatory Visit: Payer: Self-pay | Admitting: Infectious Diseases

## 2010-02-26 ENCOUNTER — Encounter: Payer: Self-pay | Admitting: Internal Medicine

## 2010-02-26 LAB — CONVERTED CEMR LAB
HCV Ab: NEGATIVE
Hep B Core Total Ab: NEGATIVE

## 2010-03-06 ENCOUNTER — Telehealth: Payer: Self-pay | Admitting: Internal Medicine

## 2010-03-13 ENCOUNTER — Ambulatory Visit: Payer: Self-pay | Admitting: Internal Medicine

## 2010-03-13 LAB — CONVERTED CEMR LAB
AST: 91 units/L — ABNORMAL HIGH (ref 0–37)
Alkaline Phosphatase: 134 units/L — ABNORMAL HIGH (ref 39–117)
Amylase: 43 units/L (ref 27–131)
CO2: 23 meq/L (ref 19–32)
Calcium: 9.4 mg/dL (ref 8.4–10.5)
Chloride: 101 meq/L (ref 96–112)
Creatinine, Ser: 0.8 mg/dL (ref 0.4–1.5)
GFR calc non Af Amer: 107.82 mL/min (ref >60)
Potassium: 4.1 meq/L (ref 3.5–5.1)
Sodium: 133 meq/L — ABNORMAL LOW (ref 135–145)
Total Bilirubin: 5.6 mg/dL — ABNORMAL HIGH (ref 0.3–1.2)
Total Protein: 7 g/dL (ref 6.0–8.3)

## 2010-05-09 ENCOUNTER — Inpatient Hospital Stay (HOSPITAL_COMMUNITY): Admission: EM | Admit: 2010-05-09 | Discharge: 2010-05-11 | Payer: Self-pay | Admitting: Emergency Medicine

## 2010-05-12 ENCOUNTER — Ambulatory Visit: Payer: Self-pay | Admitting: Internal Medicine

## 2010-05-12 LAB — CONVERTED CEMR LAB
AST: 68 units/L — ABNORMAL HIGH (ref 0–37)
Albumin: 3.3 g/dL — ABNORMAL LOW (ref 3.5–5.2)
Alkaline Phosphatase: 139 units/L — ABNORMAL HIGH (ref 39–117)
Ammonia: 45 umol/L — ABNORMAL HIGH (ref 11–35)
BUN: 26 mg/dL — ABNORMAL HIGH (ref 6–23)
CO2: 21 meq/L (ref 19–32)
Calcium: 8.9 mg/dL (ref 8.4–10.5)
Chloride: 99 meq/L (ref 96–112)
Glucose, Bld: 120 mg/dL — ABNORMAL HIGH (ref 70–99)
Potassium: 4.1 meq/L (ref 3.5–5.3)
Total Protein: 6.5 g/dL (ref 6.0–8.3)

## 2010-05-13 ENCOUNTER — Ambulatory Visit: Payer: Self-pay | Admitting: Internal Medicine

## 2010-05-13 ENCOUNTER — Inpatient Hospital Stay (HOSPITAL_COMMUNITY): Admission: AD | Admit: 2010-05-13 | Discharge: 2010-05-15 | Payer: Self-pay | Admitting: Internal Medicine

## 2010-05-13 DIAGNOSIS — K7682 Hepatic encephalopathy: Secondary | ICD-10-CM

## 2010-05-13 DIAGNOSIS — K7290 Hepatic failure, unspecified without coma: Secondary | ICD-10-CM

## 2010-05-13 HISTORY — DX: Hepatic failure, unspecified without coma: K72.90

## 2010-05-13 HISTORY — DX: Hepatic encephalopathy: K76.82

## 2010-05-14 ENCOUNTER — Encounter: Payer: Self-pay | Admitting: Internal Medicine

## 2010-05-27 ENCOUNTER — Encounter: Payer: Self-pay | Admitting: Internal Medicine

## 2010-05-27 ENCOUNTER — Ambulatory Visit: Payer: Self-pay | Admitting: Internal Medicine

## 2010-05-27 LAB — CONVERTED CEMR LAB
ALT: 68 units/L — ABNORMAL HIGH (ref 0–53)
AST: 84 units/L — ABNORMAL HIGH (ref 0–37)
Albumin: 3.5 g/dL (ref 3.5–5.2)
Alkaline Phosphatase: 159 units/L — ABNORMAL HIGH (ref 39–117)
Total Protein: 6.6 g/dL (ref 6.0–8.3)

## 2010-05-28 ENCOUNTER — Encounter: Payer: Self-pay | Admitting: Internal Medicine

## 2010-05-28 ENCOUNTER — Ambulatory Visit: Payer: Self-pay | Admitting: Infectious Diseases

## 2010-05-28 LAB — CONVERTED CEMR LAB
Basophils Absolute: 0 10*3/uL (ref 0.0–0.1)
Eosinophils Relative: 1 % (ref 0–5)
HCT: 31.2 % — ABNORMAL LOW (ref 39.0–52.0)
HIV 1 RNA Quant: <20 copies/mL (ref <20)
HIV-1 RNA Quant, Log: <1.3 (ref <1.30)
Lymphocytes Relative: 11 % — ABNORMAL LOW (ref 12–46)
Monocytes Relative: 12 % (ref 3–12)
Neutro Abs: 4.6 10*3/uL (ref 1.7–7.7)
Platelets: 83 10*3/uL — ABNORMAL LOW (ref 150–400)
RDW: 19.5 % — ABNORMAL HIGH (ref 11.5–15.5)

## 2010-05-29 ENCOUNTER — Telehealth: Payer: Self-pay | Admitting: Internal Medicine

## 2010-05-29 ENCOUNTER — Ambulatory Visit: Payer: Self-pay | Admitting: Internal Medicine

## 2010-06-10 ENCOUNTER — Encounter: Payer: Self-pay | Admitting: Infectious Diseases

## 2010-06-14 DIAGNOSIS — I8500 Esophageal varices without bleeding: Secondary | ICD-10-CM | POA: Insufficient documentation

## 2010-06-14 HISTORY — DX: Esophageal varices without bleeding: I85.00

## 2010-06-23 ENCOUNTER — Encounter (INDEPENDENT_AMBULATORY_CARE_PROVIDER_SITE_OTHER): Payer: Self-pay | Admitting: *Deleted

## 2010-06-26 ENCOUNTER — Inpatient Hospital Stay (HOSPITAL_COMMUNITY)
Admission: EM | Admit: 2010-06-26 | Discharge: 2010-07-02 | Payer: Self-pay | Source: Home / Self Care | Attending: Internal Medicine | Admitting: Internal Medicine

## 2010-06-26 ENCOUNTER — Encounter: Payer: Self-pay | Admitting: Internal Medicine

## 2010-06-26 HISTORY — PX: ESOPHAGOGASTRODUODENOSCOPY W/ BANDING: SHX1530

## 2010-06-29 LAB — DIFFERENTIAL
Basophils Absolute: 0 10*3/uL (ref 0.0–0.1)
Basophils Relative: 0 % (ref 0–1)
Eosinophils Absolute: 0 10*3/uL (ref 0.0–0.7)
Eosinophils Relative: 0 % (ref 0–5)
Lymphocytes Relative: 12 % (ref 12–46)
Lymphs Abs: 0.7 10*3/uL (ref 0.7–4.0)
Monocytes Absolute: 0.5 10*3/uL (ref 0.1–1.0)
Monocytes Relative: 8 % (ref 3–12)
Neutro Abs: 4.6 10*3/uL (ref 1.7–7.7)
Neutrophils Relative %: 80 % — ABNORMAL HIGH (ref 43–77)

## 2010-06-29 LAB — CROSSMATCH
ABO/RH(D): O POS
Antibody Screen: NEGATIVE
Unit division: 0
Unit division: 0

## 2010-06-29 LAB — MRSA PCR SCREENING: MRSA by PCR: NEGATIVE

## 2010-06-29 LAB — BASIC METABOLIC PANEL
BUN: 25 mg/dL — ABNORMAL HIGH (ref 6–23)
CO2: 21 mEq/L (ref 19–32)
Calcium: 8.2 mg/dL — ABNORMAL LOW (ref 8.4–10.5)
Chloride: 109 mEq/L (ref 96–112)
Creatinine, Ser: 0.89 mg/dL (ref 0.4–1.5)
GFR calc Af Amer: >60 mL/min (ref >60)
GFR calc non Af Amer: >60 mL/min (ref >60)
Glucose, Bld: 134 mg/dL — ABNORMAL HIGH (ref 70–99)
Potassium: 4.1 mEq/L (ref 3.5–5.1)
Sodium: 136 mEq/L (ref 135–145)

## 2010-06-29 LAB — PROTIME-INR
INR: 1.8 — ABNORMAL HIGH (ref 0.00–1.49)
INR: 1.77 — ABNORMAL HIGH (ref 0.00–1.49)
Prothrombin Time: 21.1 seconds — ABNORMAL HIGH (ref 11.6–15.2)
Prothrombin Time: 20.8 seconds — ABNORMAL HIGH (ref 11.6–15.2)

## 2010-06-29 LAB — COMPREHENSIVE METABOLIC PANEL
ALT: 46 U/L (ref 0–53)
AST: 77 U/L — ABNORMAL HIGH (ref 0–37)
Albumin: 2.6 g/dL — ABNORMAL LOW (ref 3.5–5.2)
Alkaline Phosphatase: 102 U/L (ref 39–117)
BUN: 29 mg/dL — ABNORMAL HIGH (ref 6–23)
CO2: 21 mEq/L (ref 19–32)
Calcium: 8.8 mg/dL (ref 8.4–10.5)
Chloride: 106 mEq/L (ref 96–112)
Creatinine, Ser: 1.03 mg/dL (ref 0.4–1.5)
GFR calc Af Amer: >60 mL/min (ref >60)
GFR calc non Af Amer: >60 mL/min (ref >60)
Glucose, Bld: 140 mg/dL — ABNORMAL HIGH (ref 70–99)
Potassium: 5.3 mEq/L — ABNORMAL HIGH (ref 3.5–5.1)
Sodium: 134 mEq/L — ABNORMAL LOW (ref 135–145)
Total Bilirubin: 6.6 mg/dL — ABNORMAL HIGH (ref 0.3–1.2)
Total Protein: 5.4 g/dL — ABNORMAL LOW (ref 6.0–8.3)

## 2010-06-29 LAB — HEMOGLOBIN AND HEMATOCRIT, BLOOD
HCT: 25.1 % — ABNORMAL LOW (ref 39.0–52.0)
HCT: 24.3 % — ABNORMAL LOW (ref 39.0–52.0)
HCT: 24.9 % — ABNORMAL LOW (ref 39.0–52.0)
HCT: 23.1 % — ABNORMAL LOW (ref 39.0–52.0)
HCT: 22 % — ABNORMAL LOW (ref 39.0–52.0)
HCT: 23 % — ABNORMAL LOW (ref 39.0–52.0)
HCT: 21.6 % — ABNORMAL LOW (ref 39.0–52.0)
HCT: 23 % — ABNORMAL LOW (ref 39.0–52.0)
HCT: 22.4 % — ABNORMAL LOW (ref 39.0–52.0)
Hemoglobin: 8.7 g/dL — ABNORMAL LOW (ref 13.0–17.0)
Hemoglobin: 8.6 g/dL — ABNORMAL LOW (ref 13.0–17.0)
Hemoglobin: 8.5 g/dL — ABNORMAL LOW (ref 13.0–17.0)
Hemoglobin: 8 g/dL — ABNORMAL LOW (ref 13.0–17.0)
Hemoglobin: 7.6 g/dL — ABNORMAL LOW (ref 13.0–17.0)
Hemoglobin: 7.9 g/dL — ABNORMAL LOW (ref 13.0–17.0)
Hemoglobin: 7.5 g/dL — ABNORMAL LOW (ref 13.0–17.0)
Hemoglobin: 8 g/dL — ABNORMAL LOW (ref 13.0–17.0)
Hemoglobin: 7.7 g/dL — ABNORMAL LOW (ref 13.0–17.0)

## 2010-06-29 LAB — CBC
HCT: 22 % — ABNORMAL LOW (ref 39.0–52.0)
Hemoglobin: 7.6 g/dL — ABNORMAL LOW (ref 13.0–17.0)
MCH: 32.3 pg (ref 26.0–34.0)
MCHC: 34.5 g/dL (ref 30.0–36.0)
MCV: 93.6 fL (ref 78.0–100.0)
Platelets: 66 10*3/uL — ABNORMAL LOW (ref 150–400)
RBC: 2.35 MIL/uL — ABNORMAL LOW (ref 4.22–5.81)
RDW: 18.5 % — ABNORMAL HIGH (ref 11.5–15.5)
WBC: 5.7 10*3/uL (ref 4.0–10.5)

## 2010-06-29 LAB — GLUCOSE, CAPILLARY: Glucose-Capillary: 101 mg/dL — ABNORMAL HIGH (ref 70–99)

## 2010-06-29 LAB — ABO/RH: ABO/RH(D): O POS

## 2010-07-01 ENCOUNTER — Encounter: Payer: Self-pay | Admitting: Internal Medicine

## 2010-07-01 LAB — HEMOGLOBIN AND HEMATOCRIT, BLOOD
HCT: 25.3 % — ABNORMAL LOW (ref 39.0–52.0)
HCT: 26.5 % — ABNORMAL LOW (ref 39.0–52.0)
HCT: 22.5 % — ABNORMAL LOW (ref 39.0–52.0)
HCT: 21.3 % — ABNORMAL LOW (ref 39.0–52.0)
HCT: 26.7 % — ABNORMAL LOW (ref 39.0–52.0)
Hemoglobin: 8.5 g/dL — ABNORMAL LOW (ref 13.0–17.0)
Hemoglobin: 9 g/dL — ABNORMAL LOW (ref 13.0–17.0)
Hemoglobin: 7.6 g/dL — ABNORMAL LOW (ref 13.0–17.0)
Hemoglobin: 7.1 g/dL — ABNORMAL LOW (ref 13.0–17.0)
Hemoglobin: 9.2 g/dL — ABNORMAL LOW (ref 13.0–17.0)

## 2010-07-01 LAB — AMMONIA: Ammonia: 69 umol/L — ABNORMAL HIGH (ref 11–35)

## 2010-07-03 ENCOUNTER — Ambulatory Visit
Admission: RE | Admit: 2010-07-03 | Discharge: 2010-07-03 | Payer: Self-pay | Source: Home / Self Care | Attending: Internal Medicine | Admitting: Internal Medicine

## 2010-07-03 ENCOUNTER — Telehealth: Payer: Self-pay | Admitting: Internal Medicine

## 2010-07-03 DIAGNOSIS — K7200 Acute and subacute hepatic failure without coma: Secondary | ICD-10-CM | POA: Insufficient documentation

## 2010-07-03 DIAGNOSIS — R609 Edema, unspecified: Secondary | ICD-10-CM | POA: Insufficient documentation

## 2010-07-05 ENCOUNTER — Encounter: Payer: Self-pay | Admitting: Infectious Diseases

## 2010-07-06 LAB — CBC
HCT: 25.5 % — ABNORMAL LOW (ref 39.0–52.0)
HCT: 25.5 % — ABNORMAL LOW (ref 39.0–52.0)
Hemoglobin: 8.8 g/dL — ABNORMAL LOW (ref 13.0–17.0)
Hemoglobin: 8.6 g/dL — ABNORMAL LOW (ref 13.0–17.0)
MCH: 30.8 pg (ref 26.0–34.0)
MCH: 30.4 pg (ref 26.0–34.0)
MCHC: 34.5 g/dL (ref 30.0–36.0)
MCHC: 33.7 g/dL (ref 30.0–36.0)
MCV: 89.2 fL (ref 78.0–100.0)
MCV: 90.1 fL (ref 78.0–100.0)
Platelets: 59 10*3/uL — ABNORMAL LOW (ref 150–400)
Platelets: 56 10*3/uL — ABNORMAL LOW (ref 150–400)
RBC: 2.86 MIL/uL — ABNORMAL LOW (ref 4.22–5.81)
RBC: 2.83 MIL/uL — ABNORMAL LOW (ref 4.22–5.81)
RDW: 19.2 % — ABNORMAL HIGH (ref 11.5–15.5)
RDW: 19.5 % — ABNORMAL HIGH (ref 11.5–15.5)
WBC: 4.1 10*3/uL (ref 4.0–10.5)
WBC: 3.6 10*3/uL — ABNORMAL LOW (ref 4.0–10.5)

## 2010-07-06 LAB — HEMOGLOBIN AND HEMATOCRIT, BLOOD
HCT: 26.8 % — ABNORMAL LOW (ref 39.0–52.0)
HCT: 26.6 % — ABNORMAL LOW (ref 39.0–52.0)
Hemoglobin: 9.2 g/dL — ABNORMAL LOW (ref 13.0–17.0)
Hemoglobin: 9.1 g/dL — ABNORMAL LOW (ref 13.0–17.0)

## 2010-07-06 LAB — CROSSMATCH
ABO/RH(D): O POS
Antibody Screen: NEGATIVE
Unit division: 0
Unit division: 0

## 2010-07-06 LAB — COMPREHENSIVE METABOLIC PANEL
ALT: 37 U/L (ref 0–53)
AST: 65 U/L — ABNORMAL HIGH (ref 0–37)
Albumin: 2.6 g/dL — ABNORMAL LOW (ref 3.5–5.2)
Alkaline Phosphatase: 121 U/L — ABNORMAL HIGH (ref 39–117)
BUN: 9 mg/dL (ref 6–23)
CO2: 18 mEq/L — ABNORMAL LOW (ref 19–32)
Calcium: 7.8 mg/dL — ABNORMAL LOW (ref 8.4–10.5)
Chloride: 111 mEq/L (ref 96–112)
Creatinine, Ser: 0.86 mg/dL (ref 0.4–1.5)
GFR calc Af Amer: >60 mL/min (ref >60)
GFR calc non Af Amer: >60 mL/min (ref >60)
Glucose, Bld: 105 mg/dL — ABNORMAL HIGH (ref 70–99)
Potassium: 3 mEq/L — ABNORMAL LOW (ref 3.5–5.1)
Sodium: 133 mEq/L — ABNORMAL LOW (ref 135–145)
Total Bilirubin: 4.2 mg/dL — ABNORMAL HIGH (ref 0.3–1.2)
Total Protein: 5 g/dL — ABNORMAL LOW (ref 6.0–8.3)

## 2010-07-06 LAB — CLOTEST (H. PYLORI), BIOPSY: Helicobacter screen: NEGATIVE

## 2010-07-06 LAB — PROTIME-INR
INR: 1.88 — ABNORMAL HIGH (ref 0.00–1.49)
Prothrombin Time: 21.8 seconds — ABNORMAL HIGH (ref 11.6–15.2)

## 2010-07-10 ENCOUNTER — Telehealth: Payer: Self-pay | Admitting: Internal Medicine

## 2010-07-12 LAB — CONVERTED CEMR LAB
ALT: 29 units/L (ref 0–53)
AST: 28 units/L (ref 0–37)
Albumin: 4.6 g/dL (ref 3.5–5.2)
Alkaline Phosphatase: 87 units/L (ref 39–117)
BUN: 25 mg/dL — ABNORMAL HIGH (ref 6–23)
Basophils Absolute: 0 10*3/uL (ref 0.0–0.1)
Basophils Relative: 0 % (ref 0–1)
CD4 Count: 440 microliters
CO2: 21 meq/L (ref 19–32)
Calcium: 9.2 mg/dL (ref 8.4–10.5)
Chloride: 103 meq/L (ref 96–112)
Cholesterol: 234 mg/dL — ABNORMAL HIGH (ref 0–200)
Creatinine, Ser: 0.93 mg/dL (ref 0.40–1.50)
Eosinophils Absolute: 0.2 10*3/uL (ref 0.0–0.7)
Eosinophils Relative: 3 % (ref 0–5)
Glucose, Bld: 99 mg/dL (ref 70–99)
HCT: 45.3 % (ref 39.0–52.0)
HDL: 36 mg/dL — ABNORMAL LOW (ref >39)
HIV 1 RNA Quant: <50 copies/mL (ref <50)
HIV-1 RNA Quant, Log: <1.7 (ref <1.70)
Hemoglobin: 15.2 g/dL (ref 13.0–17.0)
Lymphocytes Relative: 25 % (ref 12–46)
Lymphs Abs: 1.7 10*3/uL (ref 0.7–3.3)
MCHC: 33.6 g/dL (ref 30.0–36.0)
MCV: 92.6 fL (ref 78.0–100.0)
Monocytes Absolute: 0.5 10*3/uL (ref 0.2–0.7)
Monocytes Relative: 7 % (ref 3–11)
Neutro Abs: 4.5 10*3/uL (ref 1.7–7.7)
Neutrophils Relative %: 65 % (ref 43–77)
Platelets: 212 10*3/uL (ref 150–400)
Potassium: 4.5 meq/L (ref 3.5–5.3)
RBC: 4.89 M/uL (ref 4.22–5.81)
RDW: 13.1 % (ref 11.5–14.0)
Sodium: 136 meq/L (ref 135–145)
Total Bilirubin: 0.3 mg/dL (ref 0.3–1.2)
Total CHOL/HDL Ratio: 6.5
Total Protein: 7.1 g/dL (ref 6.0–8.3)
Triglycerides: 405 mg/dL — ABNORMAL HIGH (ref <150)
WBC: 6.8 10*3/uL (ref 4.0–10.5)

## 2010-07-14 ENCOUNTER — Other Ambulatory Visit: Payer: Self-pay | Admitting: Internal Medicine

## 2010-07-14 DIAGNOSIS — F329 Major depressive disorder, single episode, unspecified: Secondary | ICD-10-CM

## 2010-07-14 MED ORDER — ALPRAZOLAM 0.5 MG PO TABS: 0.5000 mg | ORAL_TABLET | Freq: Three times a day (TID) | ORAL | Status: DC | PRN

## 2010-07-15 ENCOUNTER — Ambulatory Visit: Admit: 2010-07-15 | Payer: Self-pay | Admitting: Internal Medicine

## 2010-07-15 ENCOUNTER — Encounter: Payer: Self-pay | Admitting: Internal Medicine

## 2010-07-15 ENCOUNTER — Ambulatory Visit (INDEPENDENT_AMBULATORY_CARE_PROVIDER_SITE_OTHER): Payer: Self-pay | Admitting: Internal Medicine

## 2010-07-15 ENCOUNTER — Other Ambulatory Visit: Payer: Self-pay | Admitting: Internal Medicine

## 2010-07-15 DIAGNOSIS — K7290 Hepatic failure, unspecified without coma: Secondary | ICD-10-CM

## 2010-07-15 DIAGNOSIS — I8501 Esophageal varices with bleeding: Secondary | ICD-10-CM

## 2010-07-15 DIAGNOSIS — K250 Acute gastric ulcer with hemorrhage: Secondary | ICD-10-CM

## 2010-07-15 DIAGNOSIS — K709 Alcoholic liver disease, unspecified: Secondary | ICD-10-CM

## 2010-07-15 DIAGNOSIS — K7200 Acute and subacute hepatic failure without coma: Secondary | ICD-10-CM

## 2010-07-15 DIAGNOSIS — K7030 Alcoholic cirrhosis of liver without ascites: Secondary | ICD-10-CM | POA: Insufficient documentation

## 2010-07-15 LAB — COMPREHENSIVE METABOLIC PANEL
ALT: 39 U/L (ref 0–53)
AST: 80 U/L — ABNORMAL HIGH (ref 0–37)
BUN: 10 mg/dL (ref 6–23)
CO2: 25 mEq/L (ref 19–32)
Chloride: 104 mEq/L (ref 96–112)
GFR: 103.28 mL/min (ref >60.00)
Sodium: 136 mEq/L (ref 135–145)
Total Bilirubin: 3.5 mg/dL — ABNORMAL HIGH (ref 0.3–1.2)
Total Protein: 6.2 g/dL (ref 6.0–8.3)

## 2010-07-15 LAB — APTT: aPTT: 35.7 s — ABNORMAL HIGH (ref 21.7–28.8)

## 2010-07-15 LAB — CBC WITH DIFFERENTIAL/PLATELET
Basophils Relative: 2 % (ref 0.0–3.0)
Eosinophils Relative: 2 % (ref 0.0–5.0)
HCT: 31.1 % — ABNORMAL LOW (ref 39.0–52.0)
Hemoglobin: 10.6 g/dL — ABNORMAL LOW (ref 13.0–17.0)
Monocytes Relative: 10 % (ref 3.0–12.0)
Neutrophils Relative %: 65 % (ref 43.0–77.0)
Platelets: 121 10*3/uL — ABNORMAL LOW (ref 150.0–400.0)
RDW: 20.4 % — ABNORMAL HIGH (ref 11.5–14.6)

## 2010-07-16 ENCOUNTER — Ambulatory Visit: Admit: 2010-07-16 | Payer: Self-pay | Admitting: Internal Medicine

## 2010-07-16 NOTE — Assessment & Plan Note (Signed)
Summary: roa/bmw   Vital Signs:  Patient profile:   45 year old male Height:      72 inches Weight:      227 pounds BMI:     30.90 Temp:     99.1 degrees F oral Pulse rate:   100 / minute Resp:     14 per minute BP sitting:   140 / 90  (left arm)  Vitals Entered By: Allyne Gee, LPN (November 17, 3612 4:31 PM) CC: roa-stateshe is feeling better--cant find iron tablets to take   CC:  roa-stateshe is feeling better--cant find iron tablets to take.  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 Take 1 Tablet By Mouth At Bedtime 2)  Chlordiazepoxide Hcl 25 Mg Caps (Chlordiazepoxide Hcl) .... One By Mouth Three Times A Day For 3 Days The One By Mouth Two Times A Day For 3 Days Then One By Mouth Q Hs For 1 Week 3)  Lexapro 10 Mg Tabs (Escitalopram Oxalate) .... Take 1 Tablet By Mouth Once A Day 4)  Diovan Hct 160-12.5 Mg Tabs (Valsartan-Hydrochlorothiazide) .... One By  Mouth Daily  Hold Until Off The Spironolactone 5)  Amitriptyline Hcl 50 Mg  Tabs (Amitriptyline Hcl) .... One Tab By Mouth Qhs 6)  Cialis 5 Mg Tabs (Tadalafil) .... One By Mouth Daily As Need 7)  Clotrimazole-Betamethasone 1-0.05 % Lotn (Clotrimazole-Betamethasone) .... Aplly To Rash Bid 8)  Amitriptyline Hcl 50 Mg Tab (Amitriptyline Hcl) .... Take 1 Tablet By Mouth At Bedtime 9)  Vitamin B-1 500 Mg Tabs (Thiamine Hcl) .... One By Mouth Daily 10)  Ferrous Sulfate 325 (65 Fe) Mg Tabs (Ferrous Sulfate) .... One By Mouth Two Times A Day With Food 11)  Spironolactone 50 Mg Tabs (Spironolactone) .... One By Mouth Two Times A Day  Allergies (verified): 1)  ! Pcn  Past History:  Risk Factors: Alcohol Use: 2 (11/17/2009) Caffeine Use: yes (02/27/2009) Exercise: no (02/27/2009)  Risk Factors: Smoking Status: current (11/17/2009) Packs/Day: 0.5 (11/17/2009) Passive Smoke Exposure: yes (11/17/2009)  Past medical, surgical, family and social histories (including risk factors) reviewed, and no changes noted (except as noted below).  Past Medical History: Reviewed history from 06/23/2006 and no changes required. Anxiety Depression HIV disease Hyperlipidemia Hypertension Steatohepatitis, 2005 Possible reflux esophagitis  Family History: Reviewed history and no changes required.  Social History: Reviewed history and no changes required.  Review of Systems  The patient denies anorexia, fever, weight loss, weight gain, vision  loss, decreased hearing, hoarseness, chest pain, syncope, dyspnea on exertion, peripheral edema, prolonged cough, headaches, hemoptysis, abdominal pain, melena, hematochezia, severe indigestion/heartburn, hematuria, incontinence, genital sores, muscle weakness, suspicious skin lesions, transient blindness, difficulty walking, depression, unusual weight change, abnormal bleeding, enlarged lymph nodes, angioedema, and breast masses.    Physical Exam  General:  alert,  well-developed, and well-nourished.  in pain Head:  increased parotid mass Eyes:  vision grossly intact, scleral icterus, conjunctival injection, and excessive tearing.   Nose:  no external deformity.   Mouth:  good dentition and pharynx pink and moist.   Neck:  no masses.   Lungs:  normal respiratory effort, no intercostal retractions, R base crackles, and L base dullness.   Heart:  normal rate, regular rhythm, and no gallop.     Impression & Recommendations:  Problem # 1:  OTHER ASCITES (ICD-789.59)  Problem # 2:  ALCOHOLIC HEPATITIS (KVQ-259.5)  Orders: TLB-Hepatic/Liver Function Pnl (80076-HEPATIC)  Complete Medication List: 1)  Atripla [phone] Mg Tabs (Efavirenz-emtricitab-tenofovir) .... Take 1 tablet by mouth at bedtime 2)  Chlordiazepoxide Hcl 25 Mg Caps (Chlordiazepoxide hcl) .... One by mouth three times a day for 3 days the one by mouth two times a day for 3 days then one by mouth q hs for 1 week 3)  Lexapro 10 Mg Tabs (Escitalopram oxalate) .... Take 1 tablet by mouth once a day 4)  Diovan Hct 160-12.5 Mg Tabs (Valsartan-hydrochlorothiazide) .... One by mouth daily  hold until off the spironolactone 5)  Amitriptyline Hcl 50 Mg Tabs (Amitriptyline hcl) .... One tab by mouth qhs 6)  Cialis 5 Mg Tabs (Tadalafil) .... One by mouth daily as need 7)  Clotrimazole-betamethasone 1-0.05 % Lotn (Clotrimazole-betamethasone) .... Aplly to rash bid 8)  Amitriptyline Hcl 50 Mg Tab (Amitriptyline hcl) .... Take 1 tablet by mouth at bedtime 9)  Vitamin B-1 500 Mg Tabs (Thiamine hcl) .... One by mouth daily 10)  Ferrous Sulfate 325 (65 Fe) Mg Tabs (Ferrous sulfate) .... One by mouth two times a day with food 11)  Spironolactone 50 Mg Tabs (Spironolactone) .... One by mouth two times a day  Patient Instructions: 1)  Please schedule a follow-up appointment in 1 month.

## 2010-07-16 NOTE — Procedures (Signed)
Summary: Upper Endoscopy  Patient: [name] Note: All result statuses are Final unless otherwise noted.  Tests: (1) Upper Endoscopy (EGD)   EGD Upper Endoscopy       DONE     [facility]     [address] [place], [place]  [address]           ENDOSCOPY PROCEDURE REPORT           PATIENT:  [name], [name]  MR#:  [id number]     BIRTHDATE:  February 25, 1966, 44 yrs. old  GENDER:  male           ENDOSCOPIST:  Iva Boop, MD, [facility]           PROCEDURE DATE:  06/26/2010     PROCEDURE:  EGD with band ligation of varices     ASA CLASS:  Class III     INDICATIONS:  hematemesis           MEDICATIONS:   Fentanyl 50 mcg, Versed 5 mg     TOPICAL ANESTHETIC:  Cetacaine Spray           DESCRIPTION OF PROCEDURE:   After the risks benefits and     alternatives of the procedure were thoroughly explained, informed     consent was obtained.  The Pentax Gastroscope [id number] endoscope     was introduced through the mouth and advanced to the stomach     antrum, limited by clots / food.   The instrument was slowly     withdrawn as the mucosa was fully examined.     <<PROCEDUREIMAGES>>           Small-appearing varices suspected in distal esophagus. No active     bleeding. Reflux of old blood made visualization somewhat     difficult.  Large amount of clots, some food debris in the     proximal stomach precluded further or adequate visualization     there. Given the clinical scenario, I placed three ligating bands     in distal esophagus. Four fired.  Retroflexed views revealed not     done.    The scope was then withdrawn from the patient and the     procedure completed.           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     RECOMMENDATIONS:     See orders - octreotide with supportive care,  relook EGD within     a few days most likely - to exclude other bleeding sources.           Iva Boop, MD, [facility]           CC:  Stacie Glaze, MD           n.     Rosalie Doctor:   Iva Boop at 06/26/2010 12:01 PM           Rachael Fee, [id number]  Note: An exclamation mark (!) indicates a result that was not dispersed into the flowsheet. Document Creation Date: 06/26/2010 12:04 PM _______________________________________________________________________  (1) Order result status: Final Collection or observation date-time: 06/26/2010 11:49 Requested date-time:  Receipt date-time:  Reported date-time:  Referring Physician:   Ordering Physician: Stan Head [id number]) Specimen Source:  Source: Launa Grill Order Number: [id number] Lab site:

## 2010-07-16 NOTE — Assessment & Plan Note (Signed)
Summary: med check and refills/cjr   Vital Signs:  Patient profile:   45 year old male Height:      72 inches Weight:      201 pounds BMI:     27.36 Temp:     98.2 degrees F oral Pulse rate:   80 / minute Resp:     14 per minute BP sitting:   140 / 80  (left arm)  Vitals Entered By: Allyne Gee, LPN (March 13, 6788 9:46 AM) CC: roa med review- stopped lexapro Is Patient Diabetic? No   Primary Care Amamda Curbow:  Ricard Dillon MD  CC:  roa med review- stopped lexapro.  History of Present Illness: The pt was seen in ID clinic on the 15th and the Hep screen was negative  the pt has elevations in  Bilirubin the pt has cirrhosis for alcoholism the pt has been on aldactone ( spironolactone) the pt has been going to AA and the has helped and he remaines sober he has lost weigth still has tremors  Preventive Screening-Counseling & Management  Alcohol-Tobacco     Alcohol drinks/day: 2     Alcohol type: spirits     Smoking Status: current     Smoking Cessation Counseling: yes     Smoke Cessation Stage: contemplative     Packs/Day: 0.5     Passive Smoke Exposure: yes  Problems Prior to Update: 1)  Other Specified Disorders of Liver  (ICD-573.8) 2)  Family History Depression  (ICD-V17.0) 3)  Other Ascites  (FYB-017.51) 4)  Alcoholic Hepatitis  (WCH-852.7) 5)  Contusion of Knee  (ICD-924.11) 6)  Knee Pain, Right  (ICD-719.46) 7)  Erectile Dysfunction, Organic  (ICD-607.84) 8)  Erythema Multiforme Minor  (ICD-695.11) 9)  Pityriasis Rosea  (ICD-696.3) 10)  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 Take 1 Tablet By Mouth At Bedtime 2)  Alprazolam 0.5 Mg Tabs (Alprazolam) .... One By Mouth Three Times A Day 3)  Lexapro 10 Mg Tabs (Escitalopram Oxalate) .... Take 1 Tablet By Mouth Once A Day 4)  Diovan Hct 160-12.5 Mg Tabs (Valsartan-Hydrochlorothiazide) .... One By Mouth Daily  Hold Until Off The Spironolactone 5)  Cialis 5 Mg Tabs (Tadalafil) .... One By Mouth Daily As Need 6)  Clotrimazole-Betamethasone 1-0.05 % Lotn (Clotrimazole-Betamethasone) .... Aplly To Rash Bid 7)  Vitamin B-1 500 Mg Tabs (Thiamine Hcl) .... One By Mouth Daily 8)  Ferrous Sulfate 325 (65 Fe) Mg Tabs (Ferrous Sulfate) .... One By Mouth Two Times A Day With Food 9)  Spironolactone 50 Mg Tabs (Spironolactone) .... One By Mouth Daily in Am Reduce Current Diovan To 1/2 Tablet While On This Medication 10)  Amitriptyline Hcl 25 Mg Tabs (Amitriptyline Hcl) .Marland Kitchen.. 1 By Mouth At Bedtime 11)  Zithromax Z-Pak 250 Mg Tabs (Azithromycin) .... As Directed 12)  Mucinex Dm 30-600 Mg Xr12h-Tab (Dextromethorphan-Guaifenesin) .... As Directed  Current Medications (verified): 1)  Atripla 600-200-300 Mg Tabs (Efavirenz-Emtricitab-Tenofovir) .... Take 1 Tablet By Mouth At Bedtime 2)  Alprazolam 0.5 Mg Tabs (Alprazolam) .... One By Mouth Three Times A Day 3)  Lexapro  10 Mg Tabs (Escitalopram Oxalate) .... Take 1 Tablet By Mouth Once A Day-Hold 4)  Diovan Hct 160-12.5 Mg Tabs (Valsartan-Hydrochlorothiazide) .... One By Mouth Daily  Hold Until Off The Spironolactone 5)  Cialis 5 Mg Tabs (Tadalafil) .... One By Mouth Daily As Need 6)  Clotrimazole-Betamethasone 1-0.05 % Lotn (Clotrimazole-Betamethasone) .... Aplly To Rash Bid 7)  Vitamin B-1 500 Mg Tabs (Thiamine Hcl) .... One By Mouth Daily 8)  Ferrous Sulfate 325 (65 Fe) Mg Tabs (Ferrous Sulfate) .... One By Mouth Two Times A Day With Food 9)  Spironolactone 50 Mg Tabs (Spironolactone) .... One By Mouth Daily in  Am Reduce Current Diovan To 1/2 Tablet While On This Medication 10)  Amitriptyline Hcl 25 Mg Tabs (Amitriptyline Hcl) .Marland Kitchen.. 1 By Mouth At Bedtime  Allergies (verified): 1)  ! 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  Past Medical History: Reviewed history from 06/23/2006 and no changes required. Anxiety Depression HIV disease Hyperlipidemia Hypertension Steatohepatitis, 2005 Possible reflux esophagitis  Family History: Reviewed history from 12/26/2009 and no changes required. Family History Depression  Social History: Reviewed history from 12/26/2009 and no changes required. Occupation:hair Public librarian     Review of Systems  The patient denies anorexia, fever, weight loss, weight gain, vision loss, decreased hearing, hoarseness, chest pain, syncope, dyspnea on exertion, peripheral edema, prolonged cough, headaches, hemoptysis, abdominal pain, melena, hematochezia, severe indigestion/heartburn, hematuria, incontinence, genital sores, muscle weakness, suspicious skin lesions, transient blindness, difficulty walking, depression, unusual weight change, abnormal bleeding, enlarged lymph nodes, angioedema, and breast masses.    Physical Exam  General:  alert and pale.   Head:  normocephalic and atraumatic.   Eyes:  pupils equal and pupils round.   Ears:  R ear normal.   Nose:  no external deformity.   Mouth:  good dentition and pharynx pink and moist.   Neck:  no masses.     Lungs:  normal respiratory effort and no intercostal retractions.   Heart:  normal rate, regular rhythm, and no gallop.   Abdomen:  no guarding and distended.     Impression & Recommendations:  Problem # 1:  OTHER ASCITES (ICD-789.59)  Orders: TLB-Hepatic/Liver Function Pnl (80076-HEPATIC) TLB-Amylase (82150-AMYL)  Problem # 2:  ALCOHOLIC HEPATITIS (ICD-571.1)  Complete Medication List: 1)  Atripla 600-200-300 Mg Tabs (Efavirenz-emtricitab-tenofovir) .... Take 1 tablet by mouth at bedtime 2)  Alprazolam 0.5 Mg Tabs (Alprazolam) .... One by mouth three times a day 3)  Citalopram Hydrobromide 20 Mg Tabs (Citalopram hydrobromide) .... One by mouth daily 4)  Micardis Hct 40-12.5 Mg Tabs (Telmisartan-hctz) .... 1/2 by mouth daily 5)  Cialis 5 Mg Tabs (Tadalafil) .... One by mouth daily as need 6)  Clotrimazole-betamethasone 1-0.05 % Lotn (Clotrimazole-betamethasone) .... Aplly to rash bid 7)  Vitamin B-1 500 Mg Tabs (Thiamine hcl) .... One by mouth daily 8)  Ferrous Sulfate 325 (65 Fe) Mg Tabs (Ferrous sulfate) .... One by mouth two times a day with food 9)  Spironolactone 50 Mg Tabs (Spironolactone) .... One by mouth daily in am reduce current diovan to 1/2 tablet while on this medication  10)  Amitriptyline Hcl 25 Mg Tabs (Amitriptyline hcl) .Marland Kitchen.. 1 by mouth at bedtime 11)  Metanx 3-35-2 Mg Tabs (L-methylfolate-b6-b12) .... One by mouth daily  Other Orders: TLB-BMP (Basic Metabolic Panel-BMET) ([id number]-HMCNOBS)  Patient Instructions: 1)  Please schedule a follow-up appointment in 3 months. Prescriptions: CITALOPRAM HYDROBROMIDE 20 MG TABS (CITALOPRAM HYDROBROMIDE) one by mouth daily  #90 x 2   Entered and Authorized by:   Ricard Dillon MD   Signed by:   Ricard Dillon MD on 03/13/2010   Method used:   Electronically to        [facility]  [phone]* (retail)       [place], [place]  [address]       Ph: [phone] or [phone]       Fax: [phone]    RxID:   [id number]   Appended Document: Orders Update     Clinical Lists Changes  Orders: Added new Service order of Venipuncture (70177) - Signed Added new Service order of Specimen Handling (99000) - Signed

## 2010-07-16 NOTE — Progress Notes (Signed)
Summary: sinus complaints  Phone Note Call from Patient Call back at Home Phone [phone]   Caller: Patient Call For: Stacie Glaze MD Summary of Call: Pt calls complaining of fever, weak, sinus congestion, not sure what his temp is but is sure he has a fever.  Having chills.  Productive cough/ no color.  Started yesterday. [facility]) Initial call taken by: Lynann Beaver CMA,  March 06, 2010 11:26 AM  Follow-up for Phone Call        per dr Lovell Sheehan- z pack,mucinex dm take as directed Follow-up by: Willy Eddy, LPN,  March 06, 2010 11:37 AM    New/Updated Medications: ZITHROMAX Z-PAK 250 MG TABS (AZITHROMYCIN) as directed MUCINEX DM 30-600 MG XR12H-TAB (DEXTROMETHORPHAN-GUAIFENESIN) as directed Prescriptions: MUCINEX DM 30-600 MG XR12H-TAB (DEXTROMETHORPHAN-GUAIFENESIN) as directed  #30 x prn   Entered by:   Lynann Beaver CMA   Authorized by:   Stacie Glaze MD   Signed by:   Lynann Beaver CMA on 03/06/2010   Method used:   Electronically to        [facility] (retail)       [address]       [place], [place]  [address]       Ph: [phone]       Fax: [phone]   RxID:   [id number] Christena Deem Z-PAK 250 MG TABS (AZITHROMYCIN) as directed  #6 x 0   Entered by:   Lynann Beaver CMA   Authorized by:   Stacie Glaze MD   Signed by:   Lynann Beaver CMA on 03/06/2010   Method used:   Electronically to        [facility] (retail)       [address]       [place], [place]  [address]       Ph: [phone]       Fax: [phone]   RxID:   [id number]  Pt. notified.

## 2010-07-16 NOTE — Consult Note (Signed)
Summary: Dr. Andrey Campanile  Dr. Andrey Campanile   Imported By: Florinda Marker 03/09/2010 15:10:17  _____________________________________________________________________  External Attachment:    Type:   Image     Comment:   External Document

## 2010-07-16 NOTE — Progress Notes (Signed)
Summary: doctor call to you  Phone Note From Other Clinic Call back at pager  [phone]   Caller: Dr Andrey Campanile from [facility] Call For: [name] Summary of Call: Pt to be seen for gall bladder removal.  Thinks he needs to see heptologist as elevated lfts  accompanied by ascites.  Thinks are from cirrohsis.  MD is in surgery tomorrow but can be reached via pager and is happy to discuss with you. Initial call taken by: Gladis Riffle, RN,  February 02, 2010 10:28 AM  Follow-up for Phone Call        discussed the  findings and agreed that surgery is not the current  isues and treating the underlying liver dz is key Follow-up by: Stacie Glaze MD,  February 03, 2010 3:43 PM

## 2010-07-16 NOTE — Consult Note (Signed)
Summary: [facility]   Imported By: Lanelle Bal 02/19/2010 12:06:44  _____________________________________________________________________  External Attachment:    Type:   Image     Comment:   External Document

## 2010-07-16 NOTE — Progress Notes (Signed)
Summary: results legs feet swollen j pt  Phone Note Call from Patient Call back at Home Phone [phone]   Caller: vm Call For: swords for jenkins Reason for Call: Lab or Test Results Summary of Call: Legs& feet still swollen.   Initial call taken by: Rudy Jew, RN,  Nov 11, 2009 11:32 AM  Follow-up for Phone Call        labs demonstrate anemia get ferritin, IBC and B12 studies (redraw if cannot be added).  f/u appt with Lovell Sheehan His blood count has fallen---have him pick up stool cards from lab---hemoocult cards Follow-up by: Birdie Sons MD,  Nov 11, 2009 12:16 PM  Additional Follow-up for Phone Call Additional follow up Details #1::        Labs tomorrow.  Appt Dr. Shela Commons 6-14 1st available.   Additional Follow-up by: Rudy Jew, RN,  Nov 11, 2009 2:32 PM     Appended Document: results legs feet swollen j pt agree, he is HIV positive as well, confirm he is on meds start OTC prilosec 20 two times a day agree with stool cards,  iron studies ulcer possible high bilirubin and SGPT elevation add GGT to labs to diferentiate liver from possible GI bleed ask pt if he has darks stools put on schedule for tomorrow at 215pm  ( no schedule built just do it as an add on at 12 with time noted in message space)  Appended Document: results legs feet swollen j pt Lab to add additional labs, and and I will speak to pt when he arrives.  Appended Document: results legs feet swollen j pt Spoke to pt, and gave him all of Dr. Lovell Sheehan" recommendations.

## 2010-07-16 NOTE — Assessment & Plan Note (Signed)
Summary: KNEE PAIN / EDEMA // RS   Vital Signs:  Patient profile:   45 year old male Height:      72 inches Temp:     98.2 degrees F oral Pulse rate:   92 / minute BP sitting:   160 / 94  (left arm)  Vitals Entered By: Allyne Gee, LPN (August 28, 6211 0:86 PM) CC: fell on rt knee on the floor   CC:  fell on rt knee on the floor.  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 Take 1 Tablet By Mouth At Bedtime 2)  Alprazolam 0.5 Mg Tabs (Alprazolam) .... Take 1 Tablet By Mouth Three Times A Day 3)  Lexapro 10 Mg Tabs (Escitalopram Oxalate) .... Take 1 Tablet By Mouth Once A Day 4)  Diovan Hct 160-12.5 Mg Tabs (Valsartan-Hydrochlorothiazide) .... One By Mouth Daily 5)  Amitriptyline Hcl 50 Mg  Tabs (Amitriptyline Hcl) .... One Tab By Mouth Qhs 6)  Cialis 5 Mg Tabs (Tadalafil) .... One By Mouth Daily As Need 7)  Clotrimazole-Betamethasone 1-0.05 % Lotn (Clotrimazole-Betamethasone) .... Aplly To Rash Bid 8)  Amitriptyline Hcl 50 Mg Tab (Amitriptyline Hcl) .... Take 1 Tablet By Mouth At Bedtime 9)  M2 B125   Cr-Tabs (B Complex-Folic Acid) .... One A Day  Allergies (verified): 1)  ! Pcn  Past History:  Risk Factors: Smoking Status: current (08/27/2009) Packs/Day: 0.5 (08/27/2009) Passive Smoke Exposure: yes (08/27/2009)  Past medical, surgical, family and social histories (including risk factors) reviewed, and no changes noted (except as noted below).  Past Medical History: Reviewed history from 06/23/2006 and no changes required. Anxiety Depression HIV disease Hyperlipidemia Hypertension Steatohepatitis, 2005 Possible reflux esophagitis  Family History: Reviewed history and no changes required.  Social History: Reviewed history and no changes required.  Review of Systems  The patient denies anorexia, fever, weight loss, weight gain, vision loss, decreased hearing, hoarseness, chest pain, syncope, dyspnea on exertion, peripheral edema, prolonged cough, headaches, hemoptysis, abdominal pain, melena, hematochezia, severe indigestion/heartburn, hematuria, incontinence, genital sores, muscle weakness, suspicious skin lesions, transient blindness, difficulty walking, depression, unusual weight change, abnormal bleeding, enlarged lymph nodes, angioedema, and breast masses.    Physical Exam  General:  alert, well-developed, and well-nourished.  in pain Msk:  5cm hematoma over knee with eccymosis Pulses:  R and L carotid,radial,femoral,dorsalis pedis and posterior tibial pulses are full and equal bilaterally Extremities:  trace right pedal edema and 1+ right pedal edema.   with eccymosis Neurologic:  alert & oriented X3 and abnormal gait.     Impression & Recommendations:  Problem # 1:  CONTUSION OF KNEE (ICD-924.11)  severe contuson form fall send for xray event  occured on sat and the pt has difficultly weigth bearing now suggestion possible fracture wrapped and sent to xray now with possible  referral t orthopedist  Orders: T-Knee Right 2 view (73560TC)  Problem # 2:  KNEE  PAIN, RIGHT (ICD-719.46)  severe sampoles of vicodin 5/200 and vimovo 500.20 given may require crutches  Discussed strengthening exercises, use of ice or heat, and medications.   Orders: T-Knee Right 2 view (73560TC)  Complete Medication List: 1)  Atripla [phone] Mg Tabs (Efavirenz-emtricitab-tenofovir) .... Take 1 tablet by mouth at bedtime 2)  Alprazolam 0.5 Mg Tabs (Alprazolam) .... Take 1 tablet by mouth three times a day 3)  Lexapro 10 Mg Tabs (Escitalopram oxalate) .... Take 1 tablet by mouth once a day 4)  Diovan Hct 160-12.5 Mg Tabs (Valsartan-hydrochlorothiazide) .... One by mouth daily 5)  Amitriptyline Hcl 50 Mg Tabs (Amitriptyline hcl) .... One tab by mouth qhs 6)  Cialis 5 Mg Tabs (Tadalafil) .... One by mouth daily as need 7)  Clotrimazole-betamethasone 1-0.05 % Lotn (Clotrimazole-betamethasone) .... Aplly to rash bid 8)  Amitriptyline Hcl 50 Mg Tab (Amitriptyline hcl) .... Take 1 tablet by mouth at bedtime 9)  M2 B125 Cr-tabs (B complex-folic acid) .... One a day  Patient Instructions: 1)  go to xray

## 2010-07-16 NOTE — Progress Notes (Signed)
Summary: appt here today  Phone Note Call from Patient   Caller: Patient Call For: Stacie Glaze MD Summary of Call: Pt's Mom calls and says he is swollen and sleeping too much.   Per Dr Lovell Sheehan, monitor respirations and if swelling is worse, take to [facility]. Initial call taken by: [facility] CMA AAMA,  July 03, 2010 1:42 PM  Follow-up for Phone Call        Mom isi briniging him in to see Dr. Rodena Medin. Follow-up by: Lynann Beaver CMA AAMA,  July 03, 2010 1:42 PM

## 2010-07-16 NOTE — Assessment & Plan Note (Signed)
Summary: discuss labs/dm   Vital Signs:  Patient profile:   45 year old male Height:      72 inches Weight:      232 pounds BMI:     31.58 Temp:     98.2 degrees F oral Pulse rate:   104 / minute Resp:     16 per minute BP sitting:   142 / 84  (left arm)  Vitals Entered By: Allyne Gee, LPN (November 13, 6732 1:93 PM) CC: ROA LABS-ABD VERY DISTENDED AND FEET VERY SWOLLEN   CC:  ROA LABS-ABD VERY DISTENDED AND FEET VERY SWOLLEN.  History of Present Illness: the blood work suggests hepatitis with symptoms that include sonolence, edema in feet and significant abdominal  swelling , mild jaudice,  he has not noted any skin itching has no fevers no diarrhea has had extremity swelling the pt has been complaint with his blood presure medicaitons since we called in the  diovan he has been depressed and has inreased his ETOH consumption     Preventive Screening-Counseling & Management  Alcohol-Tobacco     Alcohol drinks/day: 2     Alcohol type: spirits     Smoking Status: current     Smoking Cessation Counseling: yes     Smoke Cessation Stage: contemplative     Packs/Day: 0.5     Passive Smoke Exposure: yes  Problems Prior to Update: 1)  Contusion of Knee  (ICD-924.11) 2)  Knee Pain, Right  (ICD-719.46) 3)  Erectile Dysfunction, Organic  (ICD-607.84) 4)  Erythema Multiforme Minor  (ICD-695.11) 5)  Pityriasis Rosea  (ICD-696.3) 6)  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) 7)  Strain, Chest Wall  (ICD-848.8) 8)  Sinusitis, Chronic Maxillary  (ICD-473.0) 9)  Reflux Esophagitis  (ICD-530.11) 10)  Disease, Chronic Nonalcoholic Liver Nec  (XTK-240.9) 11)  Hypertension  (ICD-401.9) 12)  Hyperlipidemia  (ICD-272.4) 13)  HIV Disease  (ICD-042) 14)  Depression  (ICD-311) 15)  Anxiety  (ICD-300.00)  Current Problems (verified): 1)  Contusion of Knee  (ICD-924.11) 2)  Knee Pain, Right  (ICD-719.46) 3)  Erectile Dysfunction, Organic  (ICD-607.84) 4)   Erythema Multiforme Minor  (ICD-695.11) 5)  Pityriasis Rosea  (ICD-696.3) 6)  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 Take 1 Tablet By Mouth At Bedtime 2)  Alprazolam 0.5 Mg Tabs (Alprazolam) .... Take 1 Tablet By Mouth Three Times A Day 3)  Lexapro 10 Mg Tabs (Escitalopram Oxalate) .... Take 1 Tablet By Mouth Once A Day 4)  Diovan Hct 160-12.5 Mg Tabs (Valsartan-Hydrochlorothiazide) .... One By Mouth Daily 5)  Amitriptyline Hcl 50 Mg  Tabs (Amitriptyline Hcl) .... One Tab By Mouth Qhs 6)  Cialis 5 Mg Tabs (Tadalafil) .... One By Mouth Daily As Need 7)  Clotrimazole-Betamethasone 1-0.05 % Lotn (Clotrimazole-Betamethasone) .... Aplly To Rash Bid 8)  Amitriptyline Hcl 50 Mg Tab (Amitriptyline Hcl) .... Take 1 Tablet By Mouth At Bedtime 9)  M2 B125  Cr-Tabs (B Complex-Folic Acid) .... One A Day  Current Medications (verified): 1)  Atripla 600-200-300 Mg Tabs (Efavirenz-Emtricitab-Tenofovir) .... Take 1 Tablet By Mouth  At Bedtime 2)  Alprazolam 0.5 Mg Tabs (Alprazolam) .... Take 1 Tablet By Mouth Three Times A Day 3)  Lexapro 10 Mg Tabs (Escitalopram Oxalate) .... Take 1 Tablet By Mouth Once A Day 4)  Diovan Hct 160-12.5 Mg Tabs (Valsartan-Hydrochlorothiazide) .... One By Mouth Daily 5)  Amitriptyline Hcl 50 Mg  Tabs (Amitriptyline Hcl) .... One Tab By Mouth Qhs 6)  Cialis 5 Mg Tabs (Tadalafil) .... One By Mouth Daily As Need 7)  Clotrimazole-Betamethasone 1-0.05 % Lotn (Clotrimazole-Betamethasone) .... Aplly To Rash Bid 8)  Amitriptyline Hcl 50 Mg Tab  (Amitriptyline Hcl) .... Take 1 Tablet By Mouth At Bedtime 9)  M2 B125  Cr-Tabs (B Complex-Folic Acid) .... One A Day  Allergies (verified): 1)  ! Pcn  Past History:  Risk Factors: Alcohol Use: 2 (11/13/2009) Caffeine Use: yes (02/27/2009) Exercise: no (02/27/2009)  Risk Factors: Smoking Status: current (11/13/2009) Packs/Day: 0.5 (11/13/2009) Passive Smoke Exposure: yes (11/13/2009)  Past medical, surgical, family and social histories (including risk factors) reviewed, and no changes noted (except as noted below).  Past Medical History: Reviewed history from 06/23/2006 and no changes required. Anxiety Depression HIV disease Hyperlipidemia Hypertension Steatohepatitis, 2005 Possible reflux esophagitis PMH-FH-SH reviewed-no changes except otherwise noted  Family History: Reviewed history and no changes required.  Social History: Reviewed history and no changes required.  Review of Systems  The patient denies anorexia, fever, weight loss, weight gain, vision loss, decreased hearing, hoarseness, chest pain, syncope, dyspnea on exertion, peripheral edema, prolonged cough, headaches, hemoptysis, abdominal pain, melena, hematochezia, severe indigestion/heartburn, hematuria, incontinence, genital sores, muscle weakness, suspicious skin lesions, transient blindness, difficulty walking, depression, unusual weight change, abnormal bleeding, enlarged lymph nodes, angioedema, and breast masses.    Physical Exam  General:  alert, well-developed, and well-nourished.  in pain Eyes:  vision grossly intact, scleral icterus, conjunctival injection, and excessive tearing.   Ears:  R ear normal and L ear normal.   Nose:  no external deformity.   Mouth:  good dentition and pharynx pink and moist.   Neck:  no masses.   Lungs:  normal respiratory effort, no intercostal retractions, R base crackles, and L base dullness.   Heart:  normal rate, regular rhythm, and no gallop.   Abdomen:   distended and hepatomegaly.   Msk:  No deformity or scoliosis noted of thoracic or lumbar spine.   Extremities:  1+ left pedal edema and 1+ right pedal edema.   Neurologic:  alert & oriented X3 and gait normal.     Impression & Recommendations:  Problem # 1:  ALCOHOLIC HEPATITIS (XIP-382.5)  acute on chronic with decompensation ascites and janudice on exam refused hospitalization but needs detoc and control of ascites as well as need treatment for anema may not have a choice if labs are worse will contact partner  Orders: Venipuncture (05397) TLB-Hepatic/Liver Function Pnl (80076-HEPATIC) TLB-CBC Platelet - w/Differential (85025-CBCD) TLB-Lipase (83690-LIPASE) TLB-BMP (Basic Metabolic Panel-BMET) (67341-PFXTKWI)  Problem # 2:  HIV DISEASE (ICD-042)  Problem # 3:  OTHER ASCITES (ICD-789.59) will treat with spironolactone  Complete Medication List: 1)  Atripla 600-200-300 Mg Tabs (Efavirenz-emtricitab-tenofovir) .... Take 1 tablet by mouth at bedtime 2)  Chlordiazepoxide Hcl 25 Mg Caps (Chlordiazepoxide hcl) .... One by mouth three times a day for 3 days the one by mouth two times a day for 3 days then one by mouth q hs for 1 week 3)  Lexapro 10 Mg Tabs (Escitalopram oxalate) .... Take 1 tablet by mouth once a day 4)  Diovan  Hct 160-12.5 Mg Tabs (Valsartan-hydrochlorothiazide) .... One by mouth daily  hold until off the spironolactone 5)  Amitriptyline Hcl 50 Mg Tabs (Amitriptyline hcl) .... One tab by mouth qhs 6)  Cialis 5 Mg Tabs (Tadalafil) .... One by mouth daily as need 7)  Clotrimazole-betamethasone 1-0.05 % Lotn (Clotrimazole-betamethasone) .... Aplly to rash bid 8)  Amitriptyline Hcl 50 Mg Tab (Amitriptyline hcl) .... Take 1 tablet by mouth at bedtime 9)  Vitamin B-1 500 Mg Tabs (Thiamine hcl) .... One by mouth daily 10)  Ferrous Sulfate 325 (65 Fe) Mg Tabs (Ferrous sulfate) .... One by mouth two times a day with food 11)  Spironolactone 50 Mg Tabs (Spironolactone) ....  One by mouth two times a day  Patient Instructions: 1)  Stay on lexpro for mood 2)  add librium for withdrawl symptoms so that you can stop drinking 3)  NO NO ETOH 4)  NO BEER, WINE or ALCOHOL 5)  New medicatons are nexium 40 twice a day for your stomach 6)  iron for your blood 7)  thiamine fro your brain 8)  and librium to control withdrawl 9)  and spironolactone for  the fluid in your abdomin 10)  Come back monday at 4 PM Prescriptions: SPIRONOLACTONE 50 MG TABS (SPIRONOLACTONE) one by mouth two times a day  #60 x 0   Entered and Authorized by:   Ricard Dillon MD   Signed by:   Ricard Dillon MD on 11/13/2009   Method used:   Print then Give to Patient   RxID:   [id number] CHLORDIAZEPOXIDE HCL 25 MG CAPS (CHLORDIAZEPOXIDE HCL) one by mouth three times a day for 3 days the one by mouth two times a day for 3 days then one by mouth q HS for 1 week  #30 x 0   Entered and Authorized by:   Ricard Dillon MD   Signed by:   Ricard Dillon MD on 11/13/2009   Method used:   Print then Give to Patient   RxID:   [id number]

## 2010-07-16 NOTE — Progress Notes (Signed)
Summary: samples needed  Phone Note Refill Request Call back at Home Phone [phone]   Caller: Patient----walk in Summary of Call: pt wouls like more samples of Diovan Hct---160mg /12.5mg .   He would like to come back and pick them up today. He is out. Initial call taken by: Warnell Forester,  July 10, 2010 1:29 PM  Follow-up for Phone Call        left message on machine nne availabe-will call in to pharmacy Follow-up by: Willy Eddy, LPN,  July 10, 2010 2:18 PM

## 2010-07-16 NOTE — Assessment & Plan Note (Signed)
Summary: feet swollen//ccm   Vital Signs:  Patient profile:   45 year old male Weight:      231 pounds Pulse rate:   90 / minute BP sitting:   100 / 64  (left arm)  Vitals Entered By: Kyung Rudd, CMA (July 03, 2010 2:32 PM) CC: pt c/o edema in hands, feet, face and abd also notes family couldn't get him awake this morning   Primary Care Provider:  Stacie Glaze MD  CC:  pt c/o edema in hands, feet, and face and abd also notes family couldn't get him awake this morning.  History of Present Illness: Patient presents to clinic as a workin for evaluation of leg swelling and sleepiness. Accompanied by mother who assists in history wth his permission. Hospital records reviewed in detail. 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 Admitted with hematemesis and EGD x2 noted varices and antral ulcer. Required IVF resusitation and PRBC transfusion of totat 4 units. Discharged yesterday and denies hematemesis, hematchezia or abd pain. Hx and hospital course complicated by hepatic encephalopathy. Mother notes pt difficult to awaken this am but once awake has demonstrated nl MS without lethargy or confusion. Pt states got little rest during hospitalization and was sleepy. Also had second EGD ?day of discharge. Notes bilateral leg swelling noticed since discharge. Denies dyspnea or increasing abdominal girth.   Current Medications (verified): 1)  Atripla 600-200-300 Mg Tabs (Efavirenz-Emtricitab-Tenofovir) .... Take 1 Tablet By Mouth At Bedtime 2)  Citalopram Hydrobromide 20 Mg Tabs (Citalopram Hydrobromide) .... One By Mouth Daily 3)  Diovan Hct 160-12.5 Mg Tabs (Valsartan-Hydrochlorothiazide) .... 1/2 Once Daily 4)  Cialis 5 Mg Tabs (Tadalafil) .... One By Mouth Daily As Need 5)  Spironolactone 50 Mg Tabs (Spironolactone) .... 1/2 Once Daily 6)  Amitriptyline Hcl 25 Mg Tabs (Amitriptyline Hcl) .Marland Kitchen.. 1 By Mouth At Bedtime 7)  Metanx 3-35-2 Mg Tabs  (L-Methylfolate-B6-B12) .... One By Mouth Daily 8)  Lorazepam 0.5 Mg Tabs (Lorazepam) .Marland Kitchen.. 1 Two Times A Day As Needed 9)  Kristalose 10 Gm Pack (Lactulose) .... 45 Ml Three Times A Day 10)  Prilosec 40 Mg Cpdr (Omeprazole) .Marland Kitchen.. 1 Once Daily 11)  Nicorette Starter Kit 2 Mg Gum (Nicotine Polacrilex) .... As Directed  Allergies (verified): 1)  ! Pcn  Past History:  Past medical, surgical, family and social histories (including risk factors) reviewed for relevance to current acute and chronic problems.  Past Medical History: Reviewed history from 06/23/2006 and no changes required. Anxiety Depression HIV disease Hyperlipidemia Hypertension Steatohepatitis, 2005 Possible reflux esophagitis  Family History: Reviewed history from 12/26/2009 and no changes required. Family History Depression  Social History: Reviewed history from 12/26/2009 and no changes required. Occupation:hair Public librarian     Review of Systems General:  Denies chills, fatigue, fever, and sweats. CV:  Complains of swelling of feet and weight gain; denies chest pain or discomfort, difficulty breathing at night, fainting, shortness of breath with exertion, and swelling of hands. Resp:  Denies chest discomfort, cough, coughing up blood, and shortness of breath. GI:  Complains of yellowish skin color; denies abdominal pain, bloody stools, nausea, vomiting, and vomiting blood. Derm:  Denies changes in color of skin, flushing, and rash. Neuro:  Denies falling down and weakness. Heme:  Complains of skin discoloration; denies bleeding and fevers.  Physical Exam  General:  alert, well-developed, well-nourished, well-hydrated, appropriate dress, and normal appearance.   Head:  normocephalic and atraumatic.   Eyes:  pupils equal, pupils round, and scleral icterus.   Ears:  no external deformities.   Nose:  no external deformity.   Mouth:  Oral mucosa and oropharynx without lesions or exudates.  Teeth in  good repair. Neck:  No deformities, masses, or tenderness noted. Lungs:  Normal respiratory effort, chest expands symmetrically. Lungs are clear to auscultation, no crackles or wheezes. Heart:  Normal rate and regular rhythm. S1 and S2 normal without gallop, murmur, click, rub or other extra sounds. Abdomen:  soft.  Mild distenstion without tendnerness or mass. ?minimal ascites with dullness. +BS Extremities:  2+ left pedal edema.  no palptable cords Neurologic:  alert & oriented X3 and gait normal.   Skin:  turgor normal, no rashes, no petechiae, no purpura, no ulcerations, and icteric.   Cervical Nodes:  no anterior cervical adenopathy.   Psych:  Oriented X3, memory intact for recent and remote, normally interactive, good eye contact, not anxious appearing, and not depressed appearing.     Impression & Recommendations:  Problem # 1:  GI BLEEDING (ICD-578.9) Assessment New Contribution from esophageal varices as well as antral ulcer. S/p 4 uPRBC. No current evidence of rebleed but advised at risk given coagulopathy and varices. Strongly encouraged to begin protonix as prescribed at discharge. Keep GI f/u scheduled. Monitor for s/s of rebleed and present to ED if evidence for such. Avoid nsaids and alcohol.  Problem # 2:  HEPATIC FAILURE (ICD-570) Assessment: Unchanged Complicated by encephalopathy and coagulopathy. MS currently at baseline per family. Continue current dose of lactulose. F/u with GI. Avoid hepatotoxic substances including alcohol and tylenol.  Problem # 3:  LEG EDEMA, BILATERAL (ICD-782.3) Assessment: New Likely multi-factorial. Recent IVF resuscitation and albumin during hospitalization 2.6. Asx otherwise. Continue aldactone and add very low dose lasix for several days followed by as needed edema. Limit dosing and amount to avoid volume depletion and hypotension. Pt states understanding. Elevate legs.  His updated medication list for this problem includes:    Diovan Hct  160-12.5 Mg Tabs (Valsartan-hydrochlorothiazide) .Marland Kitchen... 1/2 once daily    Spironolactone 50 Mg Tabs (Spironolactone) .Marland Kitchen... 1/2 once daily    Lasix 20 Mg Tabs (Furosemide) .Marland Kitchen... 1/2 by mouth q am x 3 days then one by mouth qam as needed leg swelling  Complete Medication List: 1)  Atripla 600-200-300 Mg Tabs (Efavirenz-emtricitab-tenofovir) .... Take 1 tablet by mouth at bedtime 2)  Citalopram Hydrobromide 20 Mg Tabs (Citalopram hydrobromide) .... One by mouth daily 3)  Diovan Hct 160-12.5 Mg Tabs (Valsartan-hydrochlorothiazide) .... 1/2 once daily 4)  Cialis 5 Mg Tabs (Tadalafil) .... One by mouth daily as need 5)  Spironolactone 50 Mg Tabs (Spironolactone) .... 1/2 once daily 6)  Amitriptyline Hcl 25 Mg Tabs (Amitriptyline hcl) .Marland Kitchen.. 1 by mouth at bedtime 7)  Metanx 3-35-2 Mg Tabs (L-methylfolate-b6-b12) .... One by mouth daily 8)  Lorazepam 0.5 Mg Tabs (Lorazepam) .Marland Kitchen.. 1 two times a day as needed 9)  Kristalose 10 Gm Pack (Lactulose) .... 45 ml three times a day 10)  Prilosec 40 Mg Cpdr (Omeprazole) .Marland Kitchen.. 1 once daily 11)  Nicorette Starter Kit 2 Mg Gum (Nicotine polacrilex) .... As directed 12)  Lasix 20 Mg Tabs (Furosemide) .... 1/2 by mouth q am x 3 days then one by mouth qam as needed leg swelling Prescriptions: LASIX 20 MG TABS (FUROSEMIDE) 1/2 by mouth q am x 3 days then one by mouth qam as needed leg swelling  #10 x 0   Entered and Authorized by:  Edwyna Perfect MD   Signed by:   Edwyna Perfect MD on 07/03/2010   Method used:   Print then Give to Patient   RxID:   [id number]    Orders Added: 1)  Est. Patient Level IV [14782]

## 2010-07-16 NOTE — Progress Notes (Signed)
Summary: [facility] to [facility]/pt has ?  Phone Note Refill Request Call back at Home Phone [phone] Message from:  Patient on [facility]  Refills Requested: Medication #1:  SPIRONOLACTONE 50 MG TABS 1/2 once daily   Dosage confirmed as above?Dosage Confirmed new rx to [facility] [phone] also pt has questions about taper prednisone.  Initial call taken by: Heron Sabins,  May 29, 2010 10:36 AM  Follow-up for Phone Call        med sent in and dr lane will be called to tell how to titrate off prednisone Follow-up by: Willy Eddy, LPN,  May 29, 2010 11:06 AM    Prescriptions: SPIRONOLACTONE 50 MG TABS (SPIRONOLACTONE) 1/2 once daily  #30 x 6   Entered by:   Willy Eddy, LPN   Authorized by:   Stacie Glaze MD   Signed by:   Willy Eddy, LPN on 09/[birth year]/1914   Method used:   Electronically to        [facility]       [address]       [place], [place]  [address]       Ph: [phone]       Fax: [phone]   RxID:   [id number]

## 2010-07-16 NOTE — Progress Notes (Signed)
Summary: refill  Phone Note Refill Request Call back at Home Phone [phone] Message from:  Patient---live call  Refills Requested: Medication #1:  LASIX 20 MG TABS 1/2 by mouth q am x 3 days then one by mouth qam as needed leg swelling.   Brand Name Necessary? No [facility] on [address] pt is out of meds.  Initial call taken by: Warnell Forester,  July 10, 2010 1:31 PM    New/Updated Medications: LASIX 20 MG TABS (FUROSEMIDE) 1 once daily Prescriptions: LASIX 20 MG TABS (FUROSEMIDE) 1 once daily  #30 x 2   Entered by:   Willy Eddy, LPN   Authorized by:   Stacie Glaze MD   Signed by:   Willy Eddy, LPN on 14/78/2956   Method used:   Electronically to        [facility]  [phone]* (retail)       [address]       [place], [place]  [address]       Ph: [phone] or [phone]       Fax: [phone]   RxID:   [id number]

## 2010-07-16 NOTE — Assessment & Plan Note (Signed)
Summary: fu on anemia/ok per dr j/njr   Vital Signs:  Patient profile:   45 year old male Weight:      208 pounds BMI:     28.31 Pulse rate:   80 / minute Pulse rhythm:   regular BP sitting:   130 / 76  (left arm) Cuff size:   regular  Vitals Entered By: Chipper Oman, RN (December 26, 2009 4:08 PM) CC: 1 mo ROV, Hypertension Management   CC:  1 mo ROV and Hypertension Management.  History of Present Illness: recovery with attendance with meetings picked up chip monitering blood pressure and discussing sobrietry  Hypertension History:      He denies headache, chest pain, palpitations, dyspnea with exertion, orthopnea, PND, peripheral edema, visual symptoms, neurologic problems, syncope, and side effects from treatment.        Positive major cardiovascular risk factors include male age 36 years old or older, hyperlipidemia, hypertension, and current tobacco user.     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 Take 1 Tablet By Mouth At Bedtime 2)  Chlordiazepoxide Hcl 25 Mg Caps (Chlordiazepoxide Hcl) .... One By Mouth Three Times A Day For 3 Days The One By Mouth Two Times A Day For 3 Days Then One By  Mouth Q Hs For 1 Week 3)  Lexapro 10 Mg Tabs (Escitalopram Oxalate) .... Take 1 Tablet By Mouth Once A Day 4)  Diovan Hct 160-12.5 Mg Tabs (Valsartan-Hydrochlorothiazide) .... One By Mouth Daily  Hold Until Off The Spironolactone 5)  Amitriptyline Hcl 50 Mg  Tabs (Amitriptyline Hcl) .... One Tab By Mouth Qhs 6)  Cialis 5 Mg Tabs (Tadalafil) .... One By Mouth Daily As Need 7)  Clotrimazole-Betamethasone 1-0.05 % Lotn (Clotrimazole-Betamethasone) .... Aplly To Rash Bid 8)  Amitriptyline Hcl 50 Mg Tab (Amitriptyline Hcl) .... Take 1 Tablet By Mouth At Bedtime 9)  Vitamin B-1 500 Mg Tabs (Thiamine Hcl) .... One By Mouth Daily 10)  Ferrous Sulfate 325 (65 Fe) Mg Tabs (Ferrous Sulfate) .... One By Mouth Two Times A Day With Food 11)  Spironolactone 50 Mg Tabs (Spironolactone) .... One By Mouth Two Times A Day  Current Medications (verified): 1)  Atripla 600-200-300 Mg Tabs (Efavirenz-Emtricitab-Tenofovir) .... Take 1 Tablet By Mouth At Bedtime 2)  Alprazolam 0.5 Mg Tabs (Alprazolam) .... One By Mouth Three Times A Day 3)  Lexapro 10 Mg Tabs (Escitalopram Oxalate) .... Take 1 Tablet By Mouth Once A Day 4)  Diovan Hct 160-12.5 Mg Tabs (Valsartan-Hydrochlorothiazide) .... One By Mouth Daily  Hold Until Off The Spironolactone 5)  Cialis 5 Mg Tabs (Tadalafil) .... One By Mouth Daily As Need 6)  Clotrimazole-Betamethasone 1-0.05 % Lotn (Clotrimazole-Betamethasone) .... Aplly To Rash Bid 7)  Vitamin B-1 500 Mg Tabs (  Thiamine Hcl) .... One By Mouth Daily 8)  Ferrous Sulfate 325 (65 Fe) Mg Tabs (Ferrous Sulfate) .... One By Mouth Two Times A Day With Food  Allergies (verified): 1)  ! Pcn  Past History:  Past Medical History: Last updated: 06/23/2006 Anxiety Depression HIV disease Hyperlipidemia Hypertension Steatohepatitis, 2005 Possible reflux esophagitis  Risk Factors: Alcohol Use: 2 (11/17/2009) Caffeine Use: yes (02/27/2009) Exercise: no (02/27/2009)  Risk Factors: Smoking Status:  current (11/17/2009) Packs/Day: 0.5 (11/17/2009) Passive Smoke Exposure: yes (11/17/2009)     Family History: Reviewed history and no changes required. Family History Depression  Social History: Reviewed history and no changes required. Occupation:hair Archivist     Physical Exam  General:  alert and pale.   Head:  normocephalic and atraumatic.   Eyes:  pupils equal and pupils round.   Ears:  R ear normal.   Neck:  no masses.   Lungs:  normal respiratory effort and no intercostal retractions.   Heart:  normal rate, regular rhythm, and no gallop.   Abdomen:  no guarding and distended.   Skin:  icteric.     Impression & Recommendations:  Problem # 1:  ALCOHOLIC HEPATITIS (GUY-403.4) resolving  Problem # 2:  CONTUSION OF KNEE (ICD-924.11) healled  Problem # 3:  HYPERTENSION (ICD-401.9)  The following medications were removed from the medication list:    Spironolactone 50 Mg Tabs (Spironolactone) ..... One by mouth two times a day His updated medication list for this problem includes:    Diovan Hct 160-12.5 Mg Tabs (Valsartan-hydrochlorothiazide) ..... One by mouth daily  hold until off the spironolactone  BP today: 130/76 Prior BP: 140/90 (11/17/2009)  10 Yr Risk Heart Disease: 18 % Prior 10 Yr Risk Heart Disease: Not enough information (03/06/2007)  Labs Reviewed: K+: 3.6 (11/13/2009) Creat: : 0.6 (11/13/2009)   Chol: 215 (07/30/2009)   HDL: 30 (07/30/2009)   LDL: 140 (07/30/2009)   TG: 226 (07/30/2009)  Problem # 4:  HIV DISEASE (ICD-042)  BP today: 130/76 Prior BP: 140/90 (11/17/2009)  10 Yr Risk Heart Disease: 18 % Prior 10 Yr Risk Heart Disease: Not enough information (03/06/2007)  Labs Reviewed: K+: 3.6 (11/13/2009) Creat: : 0.6 (11/13/2009)   Chol: 215 (07/30/2009)   HDL: 30 (07/30/2009)   LDL: 140 (07/30/2009)   TG: 226 (07/30/2009)  Complete Medication List: 1)  Atripla 600-200-300 Mg Tabs (Efavirenz-emtricitab-tenofovir) ....  Take 1 tablet by mouth at bedtime 2)  Alprazolam 0.5 Mg Tabs (Alprazolam) .... One by mouth three times a day 3)  Lexapro 10 Mg Tabs (Escitalopram oxalate) .... Take 1 tablet by mouth once a day 4)  Diovan Hct 160-12.5 Mg Tabs (Valsartan-hydrochlorothiazide) .... One by mouth daily  hold until off the spironolactone 5)  Cialis 5 Mg Tabs (Tadalafil) .... One by mouth daily as need 6)  Clotrimazole-betamethasone 1-0.05 % Lotn (Clotrimazole-betamethasone) .... Aplly to rash bid 7)  Vitamin B-1 500 Mg Tabs (Thiamine hcl) .... One by mouth daily 8)  Ferrous Sulfate 325 (65 Fe) Mg Tabs (Ferrous sulfate) .... One by mouth two times a day with food  Hypertension Assessment/Plan:      The patient's hypertensive risk group is category B: At least one risk factor (excluding diabetes) with no target organ damage.  His calculated 10 year risk of coronary heart disease is 18 %.  Today's blood pressure is 130/76.  His blood pressure goal is < 140/90.  Patient Instructions: 1)  Please schedule a follow-up appointment in 2 months. Prescriptions: ALPRAZOLAM 0.5  MG TABS (ALPRAZOLAM) one by mouth three times a day  #90 x 2   Entered and Authorized by:   Ricard Dillon MD   Signed by:   Ricard Dillon MD on 12/26/2009   Method used:   Print then Give to Patient   RxID:   [id number]

## 2010-07-16 NOTE — Progress Notes (Signed)
Summary: to hosp  Phone Note Call from Patient Call back at [phone]   Caller: woman vm @4 :31 Summary of Call: Taking him to [facility] for his leg & alcohol treatment.   Initial call taken by: Rudy Jew, RN,  November 26, 2009 5:32 PM  Follow-up for Phone Call        dr Lovell Sheehan is aware Follow-up by: Willy Eddy, LPN,  November 27, 2009 9:19 AM

## 2010-07-16 NOTE — Progress Notes (Signed)
Summary: knee pain  Phone Note Call from Patient   Caller: Patient Call For: Stacie Glaze MD Summary of Call: Pt's knee is more swollen and painful, and he is supposed to go back to work tomorrow???? What to do? [phone] Initial call taken by: Lynann Beaver CMA,  September 01, 2009 12:01 PM  Follow-up for Phone Call        per d rjenkins- will take time- ice, wrap and keep elevaterd- when going back to work, keep knee iced and elevated as much as possible- pt informed- call if doenst improve- told pt wou make an appontmetn with orth0if he wants Follow-up by: Willy Eddy, LPN,  September 01, 2009 12:38 PM

## 2010-07-16 NOTE — Progress Notes (Signed)
Summary: please advise  Phone Note Call from Patient Call back at Home Phone [phone]   Caller: Patient Call For: Stacie Glaze MD Summary of Call: Pt no showed last ov 11-25-2009. Pt walked in to sch ov follow up on anemia. pt needs ov asap please advise Initial call taken by: Heron Sabins,  December 16, 2009 3:59 PM  Follow-up for Phone Call        add on to next friday at 4 PM Follow-up by: Stacie Glaze MD,  December 17, 2009 9:17 AM  Additional Follow-up for Phone Call Additional follow up Details #1::        lmom Additional Follow-up by: Heron Sabins,  December 17, 2009 3:29 PM    Additional Follow-up for Phone Call Additional follow up Details #2::    pt is aware 12-26-2009 4pm Follow-up by: Heron Sabins,  December 18, 2009 10:42 AM

## 2010-07-16 NOTE — Progress Notes (Signed)
Summary: bilateral leg pain  Phone Note Call from Patient   Caller: Patient Call For: Stacie Glaze MD Complaint: Breathing Problems, Urinary/GYN Problems Summary of Call: Pt calls complaining of bilateral leg pain.  LMTCB from voice mail. Pt is complaining of bilateral leg (knees to toes) pain and swelling.  He changed BP meds from Diovan to Atripla.  Bilateral knee pain with swelling, and feet are huge. No SOB, or chest pain. [facility]  Thinks he needs to be back on Diovan???  Initial call taken by: Lynann Beaver CMA,  Nov 06, 2009 8:07 AM  Follow-up for Phone Call        I am concerned that he has not been on his blood pressure medications ( he has samples of bystollic from jan?) and did not have an ROV certainly there is risk for liver and or renal dz on his HIV meds he  has been cutting pills in 1/2 to "last"  and this brings the issue of adherance. There is also a hx of ETOH We will check a Bmet and liver today 995.20   CBC 995.20 and resume the diovan until we can understand the etiology of his edema. Follow-up by: Stacie Glaze MD,  Nov 06, 2009 9:25 AM  Additional Follow-up for Phone Call Additional follow up Details #1::        labs in idx and meds out front Additional Follow-up by: Willy Eddy, LPN,  Nov 06, 2009 9:38 AM

## 2010-07-16 NOTE — Miscellaneous (Signed)
Summary: Orders Update  Clinical Lists Changes  Orders: Added new Test order of T-CBC w/Diff [phone]) - Signed Added new Test order of T-CD4SP [facility]) (CD4SP) - Signed Added new Test order of T-HIV Viral Load [phone]) - Signed Added new Test order of T-Comprehensive Metabolic Panel ([phone]) - Signed

## 2010-07-16 NOTE — Progress Notes (Signed)
Summary: Pt. wanted to let Dr. Maurice March know about current med. problems  Phone Note Call from Patient Call back at Home Phone [phone]   Caller: Patient Call For: [name], MD Reason for Call: Acute Illness Action Taken: Phone Call Completed Summary of Call: Pt. now with gallstones and ascites.  Being seen by surgeon.  Wanted to let Dr. Maurice March know.  Message left for pt. that this office received message and the pt. has f/u appt. for labs and OV w/ Dr. Maurice March early in Sept. 2011.  Gave pt. last CD4 and VL.  Encouraged pt. to keep f/u appts.  Jennet Maduro RN  February 04, 2010 2:42 PM

## 2010-07-16 NOTE — Letter (Signed)
Summary: New Patient letter  [facility]  [address] [place], [place] [address]   Phone: [phone]  Fax: [phone]       06/23/2010 MRN: [id number]  [facility] [address] [place], [place]  [address]  Dear [name],  Welcome to the Gastroenterology Division at [facility].    You are scheduled to see Dr.  Leone Payor on 07-31-10 at 10:30a.m. on the 3rd floor at [facility], [address]  We ask that you try to arrive at ouroffice 15 minutes prior to your appointment time to allow for check-in.  We would like you to complete the enclosed self-administered evaluation form prior to your visit and bring it with you on the day of your appointment.  We will review it with you.  Also, please bring a complete list of all your medications or, if you prefer, bring the medication bottles and we will list them.  Please bring your insurance card so that we may make a copy of it.  If your insurance requires a referral to see a specialist, please bring your referral form from your primary care physician.  Co-payments are due at the time of your visit and may be paid by cash, check or credit card.     Your office visit will consist of a consult with your physician (includes a physical exam), any laboratory testing he/she may order, scheduling of any necessary diagnostic testing (e.g. x-ray, ultrasound, CT-scan), and scheduling of a procedure (e.g. Endoscopy, Colonoscopy) if required.  Please allow enough time on your schedule to allow for any/all of these possibilities.    If you cannot keep your appointment, please call [phone] to cancel or reschedule prior to your appointment date.  This allows [place] the opportunity to schedule an appointment for another patient in need of care.  If you do not cancel or reschedule by 5 p.m. the business day prior to your appointment date, you will be charged a $50.00 late cancellation/no-show fee.    Thank you for choosing  [facility] for your medical needs.  We appreciate the opportunity to care for you.  Please visit [place] at our website  to learn more about our practice.                     Sincerely,                                                             The Gastroenterology Division

## 2010-07-16 NOTE — Assessment & Plan Note (Signed)
Summary: liver failure/bmw   Vital Signs:  Patient profile:   45 year old male Height:      72 inches Weight:      204 pounds BMI:     27.77 Temp:     98.2 degrees F oral Pulse rate:   88 / minute Resp:     16 per minute BP sitting:   134 / 76  (left arm)  Vitals Entered By: Allyne Gee, LPN (May 13, 9484 9:52 AM) CC: f/u hospital Is Patient Diabetic? No   Primary Care Provider:  Ricard Dillon MD  CC:  f/u hospital.  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  He has been sober for 6 months with periodic negative ETOH screens and almost daily AA atendance. He has been faithfull with his HIV medications and follow up with the [facility] ID group. Periodically we have noted "flairs of jaudice" and an [place] in August revealed gall stones. 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  His HIV drug therapy could be a cause and well as the gall stones. He will be admitted for a repeat US of his liver and a HIDA scan. We will call ID to consult and hold his Atripla until they see him. A general surgery consult may be called at the discretion of the hospitalist [facility] should also be consulted.  Preventive Screening-Counseling & Management  Alcohol-Tobacco     Alcohol drinks/day: 2     Alcohol type: spirits     Smoking Status: current     Smoking Cessation Counseling: yes     Smoke Cessation Stage: contemplative     Packs/Day: 0.5     Passive Smoke Exposure: yes  Problems Prior to Update: 1)  Other Specified Disorders of  Liver  (ICD-573.8) 2)  Family History Depression  (ICD-V17.0) 3)  Other Ascites  (IOE-703.50) 4)  Alcoholic Hepatitis  (KXF-818.2) 5)  Contusion of Knee  (ICD-924.11) 6)  Knee Pain, Right  (ICD-719.46) 7)  Erectile Dysfunction, Organic  (ICD-607.84) 8)  Erythema Multiforme Minor  (ICD-695.11) 9)  Pityriasis Rosea  (ICD-696.3) 10)  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) 11)  Strain, Chest Wall  (ICD-848.8) 12)  Sinusitis, Chronic Maxillary  (ICD-473.0) 13)  Reflux Esophagitis  (ICD-530.11) 14)  Disease, Chronic Nonalcoholic Liver Nec  (XHB-716.9) 15)  Hypertension  (ICD-401.9) 16)  Hyperlipidemia  (ICD-272.4) 17)  HIV Disease  (ICD-042) 18)  Depression  (ICD-311) 19)  Anxiety  (ICD-300.00)  Current Problems (verified): 1)  Other Specified Disorders of Liver  (ICD-573.8) 2)  Family History Depression  (ICD-V17.0) 3)  Other Ascites  (CVE-938.10) 4)  Alcoholic Hepatitis  (FBP-102.5) 5)  Contusion of Knee  (ICD-924.11) 6)  Knee Pain, Right  (ICD-719.46) 7)  Erectile Dysfunction, Organic  (ICD-607.84) 8)  Erythema Multiforme Minor  (ICD-695.11) 9)  Pityriasis Rosea  (ICD-696.3) 10)  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 Take 1 Tablet By Mouth At Bedtime 2)  Alprazolam 0.5 Mg Tabs (Alprazolam) .... One By Mouth Three Times A Day 3)  Citalopram Hydrobromide 20 Mg Tabs (Citalopram Hydrobromide) .... One By Mouth Daily 4)  Micardis Hct 40-12.5 Mg Tabs (Telmisartan-Hctz) .... 1/2 By Mouth Daily 5)  Cialis 5 Mg Tabs  (Tadalafil) .... One By Mouth Daily As Need 6)  Clotrimazole-Betamethasone 1-0.05 % Lotn (Clotrimazole-Betamethasone) .... Aplly To Rash Bid 7)  Vitamin B-1 500 Mg Tabs (Thiamine Hcl) .... One By Mouth Daily 8)  Ferrous Sulfate 325 (65 Fe) Mg Tabs (Ferrous Sulfate) .... One By Mouth Two Times A Day With Food 9)  Spironolactone 50 Mg Tabs (Spironolactone) .... One By Mouth Daily in Am Reduce Current Diovan To 1/2 Tablet While On This Medication 10)  Amitriptyline Hcl 25 Mg Tabs (Amitriptyline Hcl) .Marland Kitchen.. 1 By Mouth At Bedtime 11)  Metanx 3-35-2 Mg Tabs (L-Methylfolate-B6-B12) .... One By Mouth Daily 12)  Lorazepam 0.5 Mg Tabs (Lorazepam) .Marland Kitchen.. 1 Two Times A Day As Needed  Current Medications (verified): 1)  Atripla 600-200-300 Mg Tabs (Efavirenz-Emtricitab-Tenofovir) .... Take 1 Tablet By Mouth At Bedtime 2)  Citalopram Hydrobromide 20 Mg Tabs (Citalopram Hydrobromide) .... One By Mouth Daily 3)  Diovan Hct 160-12.5 Mg Tabs (Valsartan-Hydrochlorothiazide) .... 1/2 Once Daily 4)  Cialis 5 Mg Tabs (Tadalafil) .... One By Mouth Daily As Need 5)  Spironolactone 50 Mg Tabs (Spironolactone) .... 1/2 Once Daily 6)  Amitriptyline Hcl 25 Mg Tabs (Amitriptyline Hcl) .Marland Kitchen.. 1 By Mouth At Bedtime 7)  Metanx 3-35-2 Mg Tabs (L-Methylfolate-B6-B12) .... One By Mouth Daily 8)  Lorazepam 0.5 Mg Tabs (Lorazepam) .Marland Kitchen.. 1 Two Times A Day As Needed 9)  Kristalose 10 Gm Pack (Lactulose) .... 45 Ml Three Times A Day 10)  Prilosec 40 Mg Cpdr (Omeprazole) .Marland Kitchen.. 1 Once Daily 11)  Nicorette Starter Kit 2 Mg Gum (Nicotine Polacrilex) .... As Directed  Allergies (verified): 1)  ! 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  Past Medical History: Reviewed history from 06/23/2006 and no changes required. Anxiety Depression HIV disease Hyperlipidemia Hypertension Steatohepatitis, 2005 Possible reflux esophagitis  Family History: Reviewed history from 12/26/2009 and no changes required. Family History Depression  Social History: Reviewed history from 12/26/2009 and no changes required. Occupation:hair Archivist     Review of Systems  The patient denies anorexia, fever, weight loss, weight gain, vision loss, decreased hearing, hoarseness, chest pain, syncope, dyspnea on exertion, peripheral edema, prolonged cough, headaches, hemoptysis, abdominal pain, melena, hematochezia, severe indigestion/heartburn, hematuria, incontinence, genital sores, muscle weakness, suspicious skin lesions, transient blindness, difficulty walking, depression, unusual weight change, abnormal bleeding, enlarged lymph nodes, angioedema, and breast masses.    Physical Exam  General:  jaundiced Head:  normocephalic and atraumatic.   Eyes:  pupils equal and pupils round.  scleral icterus.  Ears:  R ear normal and L ear normal.   Nose:  no external deformity.  no nasal discharge.   Mouth:  good dentition and pharynx pink and moist.   Neck:  supple and full ROM.  increased parotid mass with out focal masses Lungs:  normal respiratory effort and no intercostal retractions.   Heart:  regular rhythm and tachycardia.   Abdomen:  soft, no guarding, no rigidity, and distended.   Msk:  no joint tenderness and no joint swelling.   Extremities:  1+ left pedal edema and 1+ right pedal edema.   Neurologic:  gait normal and confused.     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 image as appropriate consult GI and possible surgical consult I have spent greater that 45 min face to face evaluating this patient and over 1/2 of this time was in councilling consults called to hospitalists and GI  discussed case with Dr Fuller Plan  Problem # 2:  ENCEPHALOPATHY, HEPATIC (ICD-572.2) elevated amonia levels that coordinate with subjective confusion and fall see above for potentail link  Problem # 3:  HYPERTENSION (ICD-401.9)  His updated medication list for this problem includes:    Diovan Hct 160-12.5 Mg Tabs (Valsartan-hydrochlorothiazide) .Marland Kitchen... 1/2 once daily    Spironolactone 50 Mg Tabs (Spironolactone) .Marland Kitchen... 1/2 once daily  BP today: 134/76 Prior BP: 140/80 (03/13/2010)  Prior 10 Yr Risk Heart Disease: 18 % (12/26/2009)  Labs Reviewed: K+: 4.1 (05/12/2010) Creat: : 1.61 (05/12/2010)   Chol: 215 (07/30/2009)   HDL: 30 (07/30/2009)   LDL: 140 (07/30/2009)   TG: 226 (07/30/2009)  Problem # 4:  HIV DISEASE (PYP-950) certainly the atriptla could result in a cholestatic picture but the episodic nature of these attacks suggests other etiology of higher concern will hold this medicaton and consult ID if GI dose not feel that the gall stones dz is the most likely etiology  Complete Medication List: 1)  Atripla 600-200-300 Mg Tabs (Efavirenz-emtricitab-tenofovir) .... Take 1 tablet by mouth at bedtime 2)  Citalopram Hydrobromide 20 Mg Tabs (Citalopram hydrobromide) .... One by mouth daily 3)  Diovan Hct 160-12.5 Mg Tabs (Valsartan-hydrochlorothiazide) .... 1/2 once daily 4)  Cialis 5 Mg Tabs (Tadalafil) .... One by mouth daily as need 5)  Spironolactone 50 Mg Tabs (Spironolactone) .... 1/2 once daily 6)  Amitriptyline Hcl 25 Mg Tabs (Amitriptyline hcl) .Marland Kitchen.. 1 by mouth at bedtime 7)  Metanx 3-35-2 Mg Tabs (L-methylfolate-b6-b12) .... One by mouth  daily 8)  Lorazepam 0.5 Mg Tabs (Lorazepam) .Marland Kitchen.. 1 two times a day as needed 9)  Kristalose 10 Gm Pack (Lactulose) .... 45 ml three times a day 10)  Prilosec 40 Mg Cpdr (Omeprazole) .Marland Kitchen.. 1 once daily 11)  Nicorette Starter Kit 2 Mg Gum (Nicotine polacrilex) .... As directed  Patient Instructions: 1)  Go home and wait for admisson bed 2)  orders written and consultants called   Orders Added: 1)  Est. Patient Level V [93267]

## 2010-07-16 NOTE — Letter (Signed)
Summary: Out of Work  Adult nurse at [employer]  [address]   [place], [place] [address]   Phone: [phone]  Fax: [phone]    July 11, 2009   Employee:  [employer]    To Whom It May Concern:   For Medical reasons,  the above named patient should note particiapte at this time in an exercized program a the [facility]. He is being treated for hypertesion and medication adjustments are being monitered   If you need additional information, please feel free to contact our office.         Sincerely,    Stacie Glaze MD

## 2010-07-16 NOTE — Assessment & Plan Note (Signed)
Summary: 37month f/u/vs   Primary Shellene Sweigert:  Stacie Glaze MD  CC:  f/u and Hypertension Management.  History of Present Illness: Tavius has been diagnosed with ETOH cirrhosis and has had encephalopathy with hosptialization in Novwmber 2011. He has been in AA for several months now and says he has been absitinent. His LFTs in Dr Lovell Sheehan office yesterday have improved. His HIV has been relatively stable but will need CD4 and HIV load today and ordered. He has had no fever or courgh but has been weak but improving.  Hypertension History:      Positive major cardiovascular risk factors include male age 72 years old or older, hyperlipidemia, hypertension, and current tobacco user.     Current Allergies (reviewed today): ! PCN Vital Signs:  Patient profile:   45 year old male Height:      72 inches (182.88 cm) Weight:      217.50 pounds (98.86 kg) BMI:     29.60 Temp:     97.6 degrees F (36.44 degrees C) oral Pulse rate:   94 / minute BP sitting:   112 / 72  (right arm)  Vitals Entered By: Starleen Arms CMA (May 28, 2010 10:12 AM) CC: f/u, Hypertension Management Is Patient Diabetic? No Pain Assessment Patient in pain? no      Nutritional Status BMI of 25 - 29 = overweight Nutritional Status Detail nl  Does patient need assistance? Functional Status Self care Ambulation Normal   Physical Exam  General:  alert and well-hydrated. A bit slow in responding but oriented x 3 and no asterixis today.  Eyes:  vision grossly intact and pupils equal.  Very slight icterus and expected with bili 4.0 yersteday and improvinb Mouth:  good dentition and pharynx pink and moist.   Lungs:  Normal respiratory effort, chest expands symmetrically. Lungs are clear to auscultation, no crackles or wheezes. Heart:  Normal rate and regular rhythm. S1 and S2 normal without gallop, murmur, click, rub or other extra sounds. Abdomen:  non-tender, normal bowel sounds, no hepatomegaly, no  splenomegaly, and distended.     Impression & Recommendations:  Problem # 1:  HIV DISEASE (ICD-042)  Wiill get HIV and CD4 today and referfor signing up for ADAP. 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 HCV and HBV testing was negative last month.   Other Orders: Est. Patient Level IV (78295) T-CBC w/Diff (62130-86578) T-CD4SP ([facility]) (CD4SP) T-HIV1 Quant rflx Ultra or Genotype (46962-95284)  Hypertension Assessment/Plan:      The patient's hypertensive risk group is category B: At least one risk factor (excluding diabetes) with no target organ damage.  His calculated 10 year risk of coronary heart disease is 14 %.  Today's blood pressure is 112/72.  His blood pressure goal is < 140/90.   Patient Instructions: 1)  Please schedule a follow-up appointment in 8-10 weeks.

## 2010-07-16 NOTE — Assessment & Plan Note (Signed)
Summary: bp check//lh   Vital Signs:  Patient profile:   45 year old male Height:      72 inches Weight:      218 pounds BMI:     29.67 Temp:     98.2 degrees F oral Pulse rate:   92 / minute Resp:     14 per minute BP sitting:   144 / 90  (left arm)  Vitals Entered By: Allyne Gee, LPN (July 11, 1608 11:37 AM) CC: roa-rash on lower right leg is back with itching   CC:  roa-rash on lower right leg is back with itching.  History of Present Illness: target lesions ? HIV, new blood pressure meds? 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 Take 1 Tablet By Mouth At Bedtime 2)  Alprazolam 0.5 Mg Tabs (Alprazolam) .... Take 1 Tablet By Mouth Three Times A Day 3)  Lexapro 10 Mg Tabs (Escitalopram Oxalate) .... Take 1 Tablet By Mouth Once A Day 4)  Diovan Hct 160-12.5 Mg Tabs (Valsartan-Hydrochlorothiazide) .... One By Mouth Daily 5)  Amitriptyline Hcl 50 Mg  Tabs (Amitriptyline Hcl) .... One Tab By Mouth Qhs 6)  Cialis 5 Mg Tabs (Tadalafil) .... One By Mouth Daily As Need 7)  Clotrimazole-Betamethasone 1-0.05 % Lotn (Clotrimazole-Betamethasone) .... Aplly To Rash Bid 8)  Amitriptyline Hcl 50 Mg Tab (Amitriptyline Hcl)  .... Take 1 Tablet By Mouth At Bedtime 9)  M2 B125  Cr-Tabs (B Complex-Folic Acid) .... One A Day  Allergies (verified): 1)  ! Pcn  Past History:  Risk Factors: Smoking Status: current (07/11/2009) Packs/Day: 0.5 (07/11/2009) Passive Smoke Exposure: yes (07/11/2009)  Past medical, surgical, family and social histories (including risk factors) reviewed, and no changes noted (except as noted below).  Past Medical History: Reviewed history from 06/23/2006 and no changes required. Anxiety Depression HIV disease Hyperlipidemia Hypertension Steatohepatitis, 2005 Possible reflux esophagitis  Family History: Reviewed history and no changes required.  Social History: Reviewed history and no changes required.  Physical Exam  General:  alert, well-developed, and well-nourished.   Eyes:  vision grossly intact, pupils equal, and pupils round.   Lungs:  Normal respiratory effort, chest expands symmetrically. Lungs are clear to auscultation, no crackles or wheezes. Heart:  Normal rate and regular rhythm. S1 and S2 normal without gallop, murmur, click, rub or other extra sounds. Skin:  target lesion on the calves of the legs Psych:  Cognition and judgment appear intact. Alert and cooperative with normal attention span and concentration. No apparent delusions, illusions, hallucinations   Impression & Recommendations:  Problem # 1:  HIV DISEASE (ICD-042) the [facility] drug support has been extended 3 months urged to use social work or other aid to get the forms filled out  Problem # 2:  HYPERTENSION (ICD-401.9) Assessment: Unchanged  His updated medication list for this problem includes:    Diovan Hct 160-12.5 Mg Tabs (Valsartan-hydrochlorothiazide) ..... 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 Take 1 tablet by mouth at bedtime 2)  Alprazolam 0.5 Mg Tabs (Alprazolam) .... Take 1 tablet by mouth three times a day 3)  Lexapro 10 Mg Tabs (Escitalopram oxalate) .... Take 1 tablet by mouth once a day 4)  Diovan Hct 160-12.5 Mg Tabs (Valsartan-hydrochlorothiazide) .... One by mouth daily 5)  Amitriptyline Hcl 50 Mg Tabs (Amitriptyline hcl) .... One tab by mouth qhs 6)  Cialis 5 Mg Tabs (Tadalafil) .... One by mouth daily as need 7)  Clotrimazole-betamethasone 1-0.05 % Lotn (Clotrimazole-betamethasone) .... Aplly to rash bid 8)  Amitriptyline Hcl 50 Mg Tab (Amitriptyline hcl) .... Take 1 tablet by mouth at bedtime 9)  M2 B125 Cr-tabs (B complex-folic acid) .... One a day  Other Orders: Venipuncture (09735) TLB-Testosterone, Total (84403-TESTO)  Patient Instructions: 1)  this is called erythema mulitforma   or target lesion  and is usually from a drug > we wil replace the dioval with bystollic and see if the rash goes away 2)  Please schedule a follow-up appointment in 1 month. 3)  consider using off to see if these are insect ( flee bites) 4)  It is important to use your inhaler properly. Use a spacer, take slow deep breaths and hold them. Rinse your mouth after using.

## 2010-07-16 NOTE — Progress Notes (Signed)
Summary: sick  Phone Note Call from Patient   Caller: Patient Reason for Call: Insurance Question, Privacy/Consent Authorization Summary of Call: left message on machine out of work for 2 days with flu like symptoms--chills,fever, had flu shot--return call to pt [phone] Initial call taken by: Willy Eddy, LPN,  August 08, 2009 1:31 PM  Follow-up for Phone Call        mailbox full Follow-up by: Raechel Ache, RN,  August 08, 2009 1:37 PM  Additional Follow-up for Phone Call Additional follow up Details #1::        mialbox full Additional Follow-up by: Raechel Ache, RN,  August 08, 2009 2:23 PM    Additional Follow-up for Phone Call Additional follow up Details #2::    called back- chills, head congestion & feels bad. Hasn't taken his temp. Has a busy week coming up. Is there anything besides rest, fluids, Tylenol. [facility]. Follow-up by: Raechel Ache, RN,  August 08, 2009 2:37 PM  Additional Follow-up for Phone Call Additional follow up Details #3:: Details for Additional Follow-up Action Taken: per dr Colleen Can otc theraflu adn  may have z pack  called to [employer] and pt notified. Additional Follow-up by: Willy Eddy, LPN,  August 08, 2009 3:25 PM

## 2010-07-16 NOTE — Progress Notes (Signed)
Summary: spironolactone  Phone Note Call from Patient Call back at Home Phone [phone]   Caller: Patient Call For: Stacie Glaze MD Summary of Call: Pt thinks his Spironalactone is making him dizzy and nervous.  Wants to change Rx. [phone] Initial call taken by: Lynann Beaver CMA,  February 25, 2010 10:58 AM  Follow-up for Phone Call        may cut it in 1/2 but do not stop Follow-up by: Stacie Glaze MD,  February 25, 2010 3:52 PM  Additional Follow-up for Phone Call Additional follow up Details #1::        Phone Call Completed Additional Follow-up by: Rudy Jew, RN,  February 25, 2010 4:56 PM    New/Updated Medications: SPIRONOLACTONE 50 MG TABS (SPIRONOLACTONE) one by mouth daily in AM reduce current diovan to 1/2 tablet while on this medication

## 2010-07-16 NOTE — Procedures (Addendum)
Summary: Upper Endoscopy  Patient: [name] Note: All result statuses are Final unless otherwise noted.  Tests: (1) Upper Endoscopy (EGD)   EGD Upper Endoscopy       DONE     [facility]     [address] [place], [place]  [address]           ENDOSCOPY PROCEDURE REPORT           PATIENT:  [name], [name]  MR#:  [id number]     BIRTHDATE:  [birth year], 44 yrs. old  GENDER:  male           ENDOSCOPIST:  Wilhemina Bonito. Eda Keys, MD     Referred by:  Hospital in-patient           PROCEDURE DATE:  07/01/2010     PROCEDURE:  EGD with biopsy, [id number]     ASA CLASS:  Class III     INDICATIONS:  melena / Liver disease; incomplete EGD 06-26-10 due     to gastric clots           MEDICATIONS:   Fentanyl 75 mcg, Versed 6 mg IV, Benadryl 25 mg IV     TOPICAL ANESTHETIC:  Cetacaine Spray           DESCRIPTION OF PROCEDURE:   After the risks benefits and     alternatives of the procedure were thoroughly explained, informed     consent was obtained.  The Pentax Gastroscope [id number] endoscope     was introduced through the mouth and advanced to the second     portion of the duodenum, without limitations.  The instrument was     slowly withdrawn as the mucosa was fully examined.     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  A cleanbased 5mm ulcer was     found in the antrum.  Otherwise normal stomach. CLO bx taken. The     duodenal bulb was normal in appearance, as was the postbulbar     duodenum.    Retroflexed views revealed no abnormalities.    The     scope was then withdrawn from the patient and the procedure     completed.           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 Eda Keys, MD           CC:  Iva Boop, M.D., [facility]     Stacie Glaze, M.D., The Patient           n.     Rosalie DoctorJonny Ruiz  Lanelle Bal at 07/01/2010 10:34 AM           Rachael Fee, [id number]  Note: An exclamation mark (!) indicates a result that was not dispersed into the flowsheet. Document Creation Date: 07/01/2010 10:35 AM _______________________________________________________________________  (1) Order result status: Final Collection or observation date-time: 07/01/2010 10:23 Requested date-time:  Receipt date-time:  Reported date-time:  Referring Physician:   Ordering Physician: [name] [phone]) Specimen Source:  Source: Launa Grill Order Number: [id number] Lab site:

## 2010-07-16 NOTE — Miscellaneous (Signed)
Summary: [facility]  [facility]   Imported By: Florinda Marker 06/10/2010 16:20:45  _____________________________________________________________________  External Attachment:    Type:   Image     Comment:   External Document

## 2010-07-20 ENCOUNTER — Encounter: Payer: Self-pay | Admitting: Internal Medicine

## 2010-07-20 ENCOUNTER — Ambulatory Visit (INDEPENDENT_AMBULATORY_CARE_PROVIDER_SITE_OTHER): Payer: Self-pay | Admitting: Internal Medicine

## 2010-07-20 VITALS — BP 126/80 | HR 72 | Temp 98.1°F | Resp 14 | Ht 72.0 in | Wt 212.0 lb

## 2010-07-20 DIAGNOSIS — B20 Human immunodeficiency virus [HIV] disease: Secondary | ICD-10-CM

## 2010-07-20 DIAGNOSIS — R188 Other ascites: Secondary | ICD-10-CM

## 2010-07-20 DIAGNOSIS — K7682 Hepatic encephalopathy: Secondary | ICD-10-CM

## 2010-07-20 DIAGNOSIS — K922 Gastrointestinal hemorrhage, unspecified: Secondary | ICD-10-CM

## 2010-07-20 DIAGNOSIS — I10 Essential (primary) hypertension: Secondary | ICD-10-CM

## 2010-07-20 DIAGNOSIS — K7290 Hepatic failure, unspecified without coma: Secondary | ICD-10-CM

## 2010-07-20 MED ORDER — VALSARTAN 160 MG PO TABS: 160.0000 mg | ORAL_TABLET | Freq: Every day | ORAL | Status: DC

## 2010-07-20 MED ORDER — ESOMEPRAZOLE MAGNESIUM 40 MG PO CPDR: 40.0000 mg | DELAYED_RELEASE_CAPSULE | Freq: Every day | ORAL | Status: DC

## 2010-07-20 NOTE — Progress Notes (Signed)
Subjective:    Patient ID: [name], male    DOB: 1966-03-20, 45 y.o.   MRN: [id number]  HPI patient is a 45 year old white male who presents for followup.  Had a recent hospitalization for an upper GI bleed from esophageal varices which were ligated in the hospital he subsequently had a repeat EGD which showed that the ligation is holding and has had followup with a gastroenterologist who noted stability in this problem.  He will require her blood work to monitor his blood levels but we will review to see whether or not the gastroenterologist do this at the last visit. Current medical problems include hyperlipidemia hypertension monitoring for HIV disease for which he takes [place]. Stools have been loose he is on lactulose they have not been no dark black or tarry looking stools at this time.  He has not been on iron supplementation Of note was that he saw one of our partners several weeks ago for increasing edema in his feet and the provider added to furosemide to his regimen he is furosemide alone and has not been on Aldactone during this period of time.    Review of Systems  Constitutional: Negative.  Negative for fever and fatigue.  HENT: Negative.  Negative for hearing loss, congestion, neck pain and postnasal drip.   Eyes: Negative.  Negative for discharge, redness and visual disturbance.  Respiratory: Negative for cough, shortness of breath and wheezing.   Cardiovascular: Negative for leg swelling.  Gastrointestinal: Negative for abdominal pain, constipation and abdominal distention.  Genitourinary: Negative for urgency and frequency.  Musculoskeletal: Negative for joint swelling and arthralgias.  Skin: Negative for color change and rash.  Neurological: Negative for weakness and light-headedness.  Hematological: Negative for adenopathy.  Psychiatric/Behavioral: Negative for behavioral problems.       Objective:   Physical Exam  Constitutional: He appears  well-developed and well-nourished.  HENT:  Head: Normocephalic and atraumatic.  Right Ear: External ear normal.  Left Ear: External ear normal.  Eyes: Conjunctivae and EOM are normal. Pupils are equal, round, and reactive to light.       Mild icterus  Neck: Normal range of motion. Neck supple.  Cardiovascular: Normal rate and regular rhythm.   Pulmonary/Chest: Effort normal and breath sounds normal.  Abdominal: Soft. Bowel sounds are normal.       Mild distention soft nontender      extremity exam reveals no cyanosis clubbing and no edema.     Assessment & Plan:  One hypertension. His blood pressure is well controlled on his current regimen Lasix 20 mg and valsartan 160/12-1/2 HCT. This has adequately controlled his blood pressure and alleviated the edema that was noted post his hospitalization.  We will simplify this regimen today by changing to be a than 160 and the Lasix 20 one a day.  His blood pressure should be monitored and if the blood pressure starts to rise we would increase the diuretic component.  2. Liver disease the patient has mild icterus but his abdominal swelling has significantly reduced we'll monitor liver functions today.  3. Anemia his anemia is blood loss anemia from upper GI bleed from esophageal varices Will monitor iron and CBC today to see if he needs to begin iron therapy.  4. Monitor her blood work from Dr. Teresita Madura to his visit to see if we need to repeat today. On review of these laboratory values a CBC was obtained so we will not repeat a CBC today showing mild anemia platelet  count was in the 120 range in the context of his liver disease is an excellent number his protime was normal.  Therefore no additional laboratory values we got today. Samples of his blood pressure medicine and and Cialis will be given

## 2010-07-22 NOTE — Assessment & Plan Note (Addendum)
Summary: Liver disease   Vital Signs:  Patient profile:   45 year old male Height:      72 inches Weight:      211.6 pounds BMI:     28.80 Pulse rate:   88 / minute Pulse rhythm:   regular BP sitting:   124 / 66  (right arm) Cuff size:   regular  Vitals Entered By: Harlow Mares CMA Duncan Dull) (July 15, 2010 10:10 AM)  History of Present Illness Visit Type: Initial Visit Primary GI MD: Stan Head MD [facility] Primary Provider: Stacie Glaze MD Chief Complaint: Post Variceal bleed History of Present Illness:   45 yo wm with alcoholic liver disease and recent GI bleed from gastric ulcer and possibly varices. he is much improved since recent admission last month and is back to work and exercising though he is not lifting large weights.   3-4 soft bowel movements a day on Kristalose. Could not afford Xifaxan. He denies significant confusion or sleepiness. Leg edema and weight decreased significantly. Trying to avoid too much sodium.    GI Review of Systems      Denies abdominal pain, acid reflux, belching, bloating, chest pain, dysphagia with liquids, dysphagia with solids, heartburn, loss of appetite, nausea, vomiting, vomiting blood, weight loss, and  weight gain.        Denies anal fissure, black tarry stools, change in bowel habit, constipation, diarrhea, diverticulosis, fecal incontinence, heme positive stool, hemorrhoids, irritable bowel syndrome, jaundice, light color stool, liver problems, rectal bleeding, and  rectal pain.  Preventive Screening-Counseling & Management  Alcohol-Tobacco     Smoking Status: current      Drug Use:  no.    Current Medications (verified): 1)  Atripla 600-200-300 Mg Tabs (Efavirenz-Emtricitab-Tenofovir) .... Take 1 Tablet By Mouth At Bedtime 2)  Citalopram Hydrobromide 20 Mg Tabs (Citalopram Hydrobromide) .... One By Mouth Daily 3)  Diovan Hct 160-12.5 Mg Tabs (Valsartan-Hydrochlorothiazide) .... 1/2 Once Daily 4)  Cialis 5 Mg Tabs  (Tadalafil) .... One By Mouth Daily As Need 5)  Spironolactone 50 Mg Tabs (Spironolactone) .... 1/2 Once Daily 6)  Amitriptyline Hcl 25 Mg Tabs (Amitriptyline Hcl) .Marland Kitchen.. 1 By Mouth At Bedtime 7)  Metanx 3-35-2 Mg Tabs (L-Methylfolate-B6-B12) .... One By Mouth Daily 8)  Lorazepam 0.5 Mg Tabs (Lorazepam) .Marland Kitchen.. 1 Two Times A Day As Needed 9)  Kristalose 10 Gm Pack (Lactulose) .... 45 Ml Three Times A Day 10)  Protonix 40 Mg Tbec (Pantoprazole Sodium) .... Take One By Mouth Every Morning 11)  Lasix 20 Mg Tabs (Furosemide) .Marland Kitchen.. 1 Once Daily  Allergies (verified): 1)  ! Pcn  Past History:  Past Medical History: Anxiety Depression HIV disease Hyperlipidemia Hypertension Esophgeal varices 06/2010 gastric ulcer with hemorrhage 06/2010 alcoholic liver disease  Hepatic Encephalopathy Cholelithiasis  Past Surgical History: Carpal Tunnel Release left  Family History: Family History of Breast Cancer:maternal great aunts No FH of Colon Cancer: Family History of Heart Disease: mother, maternal grandfather  Social History: Occupation:hair Public librarian    Patient currently smokes. 1/2 ppd Alcohol Use - no per pt on 07/15/2010 Daily Caffeine Use 1-3 per day Illicit Drug Use - no Drug Use:  no  Physical Exam  General:  NAD Eyes:  ? slight icterus Breasts:  no gynecomastia Lungs:  Clear throughout to auscultation. Heart:  Regular rate and rhythm; no murmurs, rubs,  or bruits. Abdomen:  soft and nontender no obvious ascites liver edge palpable just below cstal margin and slightly firm  no splenomegaly Extremities:  perhaps trace edema at ankles Neurologic:  Alert and  oriented x4;  no asterixis  Skin:  spray tan efect Psych:  Alert and cooperative. Normal mood and affect.   Impression & Recommendations:  Problem # 1:  ALCOHOLIC LIVER DISEASE (ICD-571.3) Assessment Improved s/p 4- 6 weeks of prednisolon Dec2011-Jan 2012 Significantly better and with less volume  overload (minimal if any) at this time. Abstinent. At least alcoholic hepatitis and probable component of cirrhosis. INr has only nbeen mildly elevated. May be icteric today I am not sure - await labs ( he has had a sray tan). He is immune to Hep B and Hep A so does not need vaccinations. Presume he had pneumococcal and influenza vaccines at hospital - it is protocol. will need to verify at some point.  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 We may be able to reduce dose soon.  Problem # 3:  GASTRIC ULCER, ACUTE, HEMORRHAGE (ICD-531.00) Assessment: Improved Seen on second EGD. I now suspect this was source of bleed in January and not the varices but really don't know. H. pylori negative. 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 Maurice March - will defer to him re: therapy in setting of liver disease Parents still unaware of dx.  Patient Instructions: 1)  Follow a low salt diet. 2)  OK to exercise but do not lift greater than 10-15 # and do not strain or bear down. 3)  Your physician has requested that you have the following labwork done today: CBC, CMET, PT/INR 4)  You may substitute over the counter omeprazle 2- 20 mg tablets or pillss a day if the other ulcer medication ( Protonix) is too expensive. 5)  Dr. Leone Payor will see you at your endoscopy exam 08/12/10 at [facility]. Arrive at 730 AM. 6)  Copy sent to : Darryll Capers, MD 7)   The medication list was reviewed and reconciled.  All changed / newly prescribed medications were explained.  A complete medication list was provided to the patient / caregiver.   Orders Added: 1)  TLB-CBC Platelet - w/Differential [85025-CBCD] 2)  TLB-CMP (Comprehensive Metabolic Pnl) [80053-COMP] 3)  TLB-PT (Protime) [85610-PTP] 4)  TLB-PTT [85730-PTTL] 5)  ZEGD with Banding [ZEGD Band]

## 2010-07-22 NOTE — Letter (Signed)
Summary: EGD Instructions  [facility]  [address] [place], [place] [address]   Phone: [phone]  Fax: [phone]       [name]    April 06, 1966    MRN: [id number]       Procedure Day /Date:WEDNESDAY 08/12/2010     Arrival Time:7:30AM     Procedure Time:8:30AM     Location of Procedure:                     X  [facility] ( Outpatient Registration)   PREPARATION FOR ENDOSCOPY/BANDING   On 08/12/2010  THE DAY OF THE PROCEDURE:  1.   No solid foods, milk or milk products are allowed after midnight the night before your procedure.  2.   Do not drink anything colored red or purple.  Avoid juices with pulp.  No orange juice.  3.  You may drink clear liquids until4:30AM , which is 4 hours before your procedure.                                                                                                CLEAR LIQUIDS INCLUDE: Water Jello Ice Popsicles Tea (sugar ok, no milk/cream) Powdered fruit flavored drinks Coffee (sugar ok, no milk/cream) Gatorade Juice: apple, white grape, white cranberry  Lemonade Clear bullion, consomm, broth Carbonated beverages (any kind) Strained chicken noodle soup Hard Candy   MEDICATION INSTRUCTIONS  Unless otherwise instructed, you should take regular prescription medications with a small sip of water as early as possible the morning of your procedure.             OTHER INSTRUCTIONS  You will need a responsible adult at least 46 years of age to accompany you and drive you home.   This person must remain in the waiting room during your procedure.  Wear loose fitting clothing that is easily removed.  Leave jewelry and other valuables at home.  However, you may wish to bring a book to read or an iPod/MP3 player to listen to music as you wait for your procedure to start.  Remove all body piercing jewelry and leave at home.  Total time from sign-in until discharge is approximately 2-3 hours.  You  should go home directly after your procedure and rest.  You can resume normal activities the day after your procedure.  The day of your procedure you should not:   Drive   Make legal decisions   Operate machinery   Drink alcohol   Return to work  You will receive specific instructions about eating, activities and medications before you leave.    The above instructions have been reviewed and explained to me by   _______________________    I fully understand and can verbalize these instructions _____________________________ Date _________

## 2010-07-26 NOTE — Discharge Summary (Addendum)
[name], [name]               ACCOUNT NO.:  [id number]  MEDICAL RECORD NO.:  [id number]          PATIENT TYPE:  INP  LOCATION:  1518                         FACILITY:  [facility]  PHYSICIAN:  Wilhemina Bonito. Marina Goodell, MD      DATE OF BIRTH:  03-22-1966  DATE OF ADMISSION:  06/26/2010 DATE OF DISCHARGE:  07/02/2010                              DISCHARGE SUMMARY   ADMITTING DIAGNOSES: 48. A 45 year old male with an acute upper gastrointestinal bleed with     hematemesis, rule out variceal hemorrhage, rule out possible     Mallory-Weiss tear. 2. Anemia, acute on chronic secondary to acute blood loss. 3. Thrombocytopenia. 4. Coagulopathy. 5. End-stage alcoholic liver disease with cirrhosis, decompensated,     Model for End-Stage Liver Disease score approximately 20. 6. Mild hepatic encephalopathy. 7. History of hypertension. 8. Human immunodeficiency virus/acquired immunodeficiency syndrome,     last CD-4 count of 180, the patient on therapy. 9. Chronic anxiety and depression.  DISCHARGE DIAGNOSES: 1. Stable status post acute variceal bleed, treated with banding on     June 26, 2010.  Repeat esophagogastroduodenoscopy on July 01, 2010, small varices, evidence of prior banding, no repeat therapy     done.  In addition, 5-mm antral ulcer found clean based. 2. Anemia secondary to acute blood loss, stable.  Patient with element     of chronic anemia as well. 3. End-stage alcoholic liver disease with decompensated cirrhosis     complicated by hepatic encephalopathy, coagulopathy,     thrombocytopenia, and now variceal bleeding. 4. 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 5. History of hypertension. 6. Chronic anxiety and depression. 7. History of alcoholism, no active alcohol use x8 months.  CONSULTATIONS:  None.  PROCEDURES:  Upper endoscopy on June 26, 2010, and repeat EGD on July 01, 2010.  BRIEF HISTORY:  [name] is a 45 year old  white male, primary patient of Dr. Darryll Capers, recently known to the GI service when he was briefly hospitalized in December of 2011.  At that time, he came in with jaundice and transaminitis initially thought to be due to gallbladder disease but during the admission, workup consistent with a new diagnosis of end-stage alcoholic liver disease, decompensated cirrhosis, and alcoholic hepatitis.  The patient had some difficulty with hepatic encephalopathy during that admission as well.  Unfortunately, he also has HIV/AIDS and is on therapy with Trileptal.  We have not seen him since that time.  He presents to the Emergency Room on the morning of admission after having onset at about 2:00 a.m. with nausea and then vomiting of frank blood.  He said he felt very fatigued and somewhat lightheaded prior to going to bed last evening but had worked yesterday without any difficulty, had not noticed any recent melena.  His partner who accompanies him in the Emergency Room witnessed his vomiting and says that he vomited about 4 times at home with one of those episodes bringing up at least half a gallon of dark purplish red blood.  After vomiting several times, he eventually was helped back to bed, felt dizzy  and lightheaded, apparently fell once but did not have a syncopal episode.  His last episode of hematemesis was at about 4:00 a.m..  They arrived to the ER at about 7:30 a.m.  He was noted to be hemodynamically stable, tachycardic and had a hemoglobin of 7.6.  Pro time is 21, INR of 1.8.  Hemoglobin at the time of his discharge on December was 9.8.  He is admitted at this time for stabilization and medical management of his acute GI hemorrhage.  LABORATORY STUDIES:  Laboratory studies on admission; WBC of 5.7, hemoglobin 7.6, hematocrit of 22.0, and platelets 66,000.  Pro time 21, INR of 1.8.  Sodium 134, potassium 5.3, glucose 140, BUN 29, creatinine 1.03, total bilirubin of 6.6, alk phos  102, SGOT 77, SGPT 46, albumin 2.6.  MRSA swab was negative.  Serial labs were obtained on June 27, 2010.  Post transfusion, hemoglobin was 8.6, hematocrit of 24.3, venous ammonia was 69.  On June 28, 2010, hemoglobin down to 7.5, hematocrit of 21.6.  On June 29, 2010, hemoglobin 8.5, hematocrit of 25.3.  On June 29, 2010, hemoglobin 7.6, hematocrit of 22.5.  On June 30, 2010, hemoglobin 7.1, hematocrit 21.3.  He was given 2 more units of packed rbc's.  On July 01, 2010, hemoglobin 8, hematocrit of 25.5, and platelets 59,000.  On July 01, 2010, hemoglobin 9.1, hematocrit of 26.6, and on July 02, 2010, hemoglobin 8.6, hematocrit of 25.5. INR remains 1.88.  Sodium 133, potassium of 3, creatinine of 0.86, total bilirubin 4.2, alk phos 121, SGOT 65, and SGPT 37.  X-RAY STUDIES:  None this admission.  HOSPITAL COURSE:  The patient was admitted to the service of Dr. Stan Head who is covering the hospital.  He was started on IV fluids.  IV octreotide infusion was begun.  With hemoglobin of 7.6 in the face of active bleeding, he was transfused 2 units of packed rbc's.  He underwent emergent upper endoscopy on the morning of admission with finding of distal esophageal varices, presumably decompressed.  No active bleeding noted.  There was a lot of old blood in the stomach. Three bands were placed in the distal esophagus.  It was felt that he may require repeat EGD in a few days depending on his course.  The patient had a stable hospital course, did not manifest any further hematemesis.  He was observed in the step down unit for approximately 48 hours and then transferred to the floor.  He was placed on Xifaxan and continued on Chronulac for his encephalopathy, covered with ceftriaxone. On June 29, 2010, he admitted to having dark stools.  His hemoglobin was 8.5 which was stable.  He was weaned off of the octreotide.  His diet was advanced, and we were making plans  for discharge.  On June 30, 2010, he was somewhat agitated and uncooperative insisting on discharge; however, his hemoglobin was down to 7.6, hematocrit of 22.5. His venous ammonia was 69.  He after some discussion agreed to stay in the hospital.  He was transfused 2 more units of packed rbc's. Octreotide was restarted, and he was scheduled for repeat EGD on the morning of July 01, 2010.  This was done per Dr. Marina Goodell.  He was found to have grade 1-2 varices in the distal third of the esophagus with evidence of prior banding x3.  Did not have any repeat banding done.  He was found to have a 5-mm clean based antral ulcer as well.  CLO biopsy was  taken.  Because of sedation in the face of his hepatic encephalopathy, he was to remain in the hospital.  On the evening of July 01, 2010, he apparently left the floor, was able to leave the hospital, and was found walking out on [facility].  He was brought back by security and later stated that he was not trying to leave, that he was just out taking a walk.  The morning of July 02, 2010, he remained stable.  Hemoglobin was 8.6, and he is allowed discharge to home.  Medications and instructions were reviewed with the patient and his mother.  He is to follow up with Dr. Leone Payor on July 15, 2010, at 9:30 a.m. and we will decide on timing of any repeat endoscopies at that time.  He also has a followup appointment with Dr. Maurice March on July 31, 2010.  Pharm D has raised the issue of whether he ought to remain on Truvada in the setting of severe liver disease and we will defer that to infectious disease.  MEDICATIONS ON DISCHARGE: 1. Rifaximin 550 p.o. b.i.d. 2. Bactrim 160/800 daily. 3. Xanax 0.5 t.i.d. as needed for anxiety. 4. Amitriptyline 25 at bedtime. 5. Atripla 1 tablet at bedtime. 6. Diovan/HCTZ 1 tablet daily. 7. Spirolactone 25 mg daily. 8. Metanx 1 tablet daily. 9. Protonix 40 mg p.o. q.a.m. 10.Nicorette as  needed. 11.Phenergan 12.5 q.6h. as needed. 12.Oxycodone 5 mg q.6h. as needed for pain. 13.Lactulose/Chronulac 45 mL 3 times daily.     Amy Esterwood, PA-C   ______________________________ Wilhemina Bonito. Marina Goodell, MD    AE/MEDQ  D:  07/02/2010  T:  07/02/2010  Job:  [id number]  cc:   Stacie Glaze, MD [address] [place] [address]  Electronically Signed by Carl Gip PA-C on 07/02/2010 04:25:34 PM Electronically Signed by Yancey Flemings MD on 07/26/2010 11:24:06 AM

## 2010-07-30 NOTE — Procedures (Signed)
Summary: Instructions for procedure/[place]  Instructions for procedure/[place]   Imported By: Sherian Rein 07/20/2010 11:07:28  _____________________________________________________________________  External Attachment:    Type:   Image     Comment:   External Document

## 2010-07-31 ENCOUNTER — Ambulatory Visit: Payer: Self-pay | Admitting: Internal Medicine

## 2010-08-06 ENCOUNTER — Ambulatory Visit: Payer: Self-pay | Admitting: Infectious Diseases

## 2010-08-06 ENCOUNTER — Encounter: Payer: Self-pay | Admitting: Infectious Diseases

## 2010-08-10 ENCOUNTER — Encounter: Payer: Self-pay | Admitting: Infectious Diseases

## 2010-08-10 ENCOUNTER — Encounter: Payer: Self-pay | Admitting: Family Medicine

## 2010-08-10 ENCOUNTER — Ambulatory Visit (INDEPENDENT_AMBULATORY_CARE_PROVIDER_SITE_OTHER): Payer: Self-pay | Admitting: Family Medicine

## 2010-08-10 DIAGNOSIS — S335XXA Sprain of ligaments of lumbar spine, initial encounter: Secondary | ICD-10-CM

## 2010-08-10 NOTE — Progress Notes (Signed)
  Subjective:    Patient ID: [name], male    DOB: [birth year], 45 y.o.   MRN: [id number]  HPI  Patient is seen with intermittent low back pain right lower lumbar area past few weeks. Initially noticed after moving and doing a lot of lifting and going up and down stairs. No radiculopathy symptoms. Pain is achy quality and moderate severity. Some improvement with movement. Worse after prolonged periods of sitting. Ice helps slightly. Poor sleep quality. Taking no medications. Denies any fever , chills, dysuria , abdominal pain , urine or stool incontinence, numbness or weakness in lower extremities   Review of Systems     Objective:   Physical Exam     Patient is alert and in no distress. Chest clear to auscultation Heart regular rhythm and rate Back minimally tender right lower lumbar area. No spinal tenderness. Able to flex back 80 with minimal pain. Pain with extension at 10. Straight leg raise is negative.  Extremities no edema Neuro exam full-strength with plantar flexion and dorsi flexion bilaterally. No sensory deficits to touch. 1+ reflexes in knee and ankle bilaterally    Assessment & Plan:   low-back strain. Nonfocal neurologic exam. Stretches given. Avoidance of nonsteroidals with his history of hepatic dysfunction. Discussed appropriate role of aerobic exercise as tolerated. He'll try heat or ice for symptomatic relief and topical rubs such as bio freeze.

## 2010-08-10 NOTE — Patient Instructions (Signed)
Back Pain (Lumbo-Sacral Strain)   Back pain is one of the most common causes of pain. There are many possible causes of back pain. Most are not serious conditions.   Your backbone (spinal column) is made up of 24 main vertebral bodies in addition to the sacrum and coccyx. These are held together by tough fibrous tissue called ligaments and also by your muscles. Nerve roots pass through the openings between the vertebrae. A sudden move or injury to the back may cause injury to, or pressure upon, these nerves. This may result in localized back pain or movement (radiation) of pain into the buttocks and down the leg into the foot. The condition known as sciatica (a sharp, shooting pain from the buttock down the back of the leg) is frequently associated with a ruptured (herniated) disc. Pain may be caused by muscle spasm alone.   Your caregiver can often find the cause of your pain by the details of your symptoms and an exam. In some cases, you may need tests (such as x-rays), but these may not be right for you. Your caregiver will work with you to decide if any tests are needed based on your specific exam.   HOME CARE INSTRUCTIONS Avoid an underactive life style. Active exercise, as directed by your caregiver, is your greatest weapon against back pain. Hard physical activities such as tennis, racquetball, water skiing etc., without proper physical conditioning, may aggravate and/or create problems. This is especially true if you are not in condition for that activity. If you have a back problem, it is especially important to avoid sports requiring sudden body movements. Swimming and walking are generally safer activities. Maintain good posture. Avoid becoming over-weight (obese).   Use bed rest for only the most extreme, sudden (acute) episode. Your caregiver will help you determine how much bed rest is necessary. Ice used for acute conditions may help with pain relief. Use a large plastic bag filled with  ice, and wrap it in a towel. This may be continuous or for 30 minutes every 2 hours during the worst pain, then as needed. After you are improved and more active, heat applied to the painful area for 30 minutes before activities may help.   See your caregiver if you are having pain lasting longer than expected. Your caregiver can help or advise appropriate exercises and/or therapy if this is needed. With conditioning, most back problems can be avoided.    SEEK IMMEDIATE MEDICAL CARE IF:  You have numbness, tingling, weakness, or problem with the use of your arms or legs.  You experience severe back pain not relieved with medications.  There is a change in bowel or bladder control.  You have increasing pain in any areas of the body, including your stomach or belly (abdomen).  You notice shortness of breath, dizziness or fainting.  You feel sick to your stomach (nauseous), are vomiting or have sweats.  You notice discoloration of your toes or legs, or your feet get very cold.  You develop a fever along with worsening back pain.   Aerobic exercise as tolerated. Stretches as instructed. Heat or ice for symptomatic relief. Topical rub such as Biofreeze Amitriptyline for sleep as needed. MAKE SURE YOU:   Understand these instructions.   Watch your condition.  Get help right away if you are not doing well or get worse.   Document Released: 03/10/2005  Document Re-Released: 06/22/2009 [facility] Patient Information 2011 [place], [place].

## 2010-08-11 ENCOUNTER — Telehealth: Payer: Self-pay | Admitting: Internal Medicine

## 2010-08-11 NOTE — Miscellaneous (Addendum)
  Clinical Lists Changes  Orders: Added new Test order of T-CBC w/Diff [phone]) - Signed Added new Test order of T-CD4SP [facility]) (CD4SP) - Signed Added new Test order of T-Comprehensive Metabolic Panel [phone]) - Signed Added new Test order of T-HIV Viral Load [phone]) - Signed     Process Orders Check Orders Results:     [facility]: ABN not required for this insurance Tests Sent for requisitioning (August 06, 2010 9:12 AM):     12/05/2010: [facility] -- T-CBC w/Diff [[id number]] (signed)     12/05/2010: [facility] -- T-Comprehensive Metabolic Panel [[id number]] (signed)     12/05/2010: [facility] -- T-HIV Viral Load [phone] (signed)

## 2010-08-11 NOTE — Miscellaneous (Signed)
  Clinical Lists Changes 

## 2010-08-12 ENCOUNTER — Encounter: Payer: Self-pay | Admitting: Internal Medicine

## 2010-08-12 ENCOUNTER — Ambulatory Visit (HOSPITAL_COMMUNITY)
Admission: RE | Admit: 2010-08-12 | Discharge: 2010-08-12 | Disposition: A | Discharge status: Home / Self Care | Payer: Medicaid Other | Source: Ambulatory Visit | Attending: Internal Medicine | Admitting: Internal Medicine

## 2010-08-12 ENCOUNTER — Other Ambulatory Visit: Payer: Self-pay | Admitting: Internal Medicine

## 2010-08-12 DIAGNOSIS — E785 Hyperlipidemia, unspecified: Secondary | ICD-10-CM | POA: Insufficient documentation

## 2010-08-12 DIAGNOSIS — Z21 Asymptomatic human immunodeficiency virus [HIV] infection status: Secondary | ICD-10-CM | POA: Insufficient documentation

## 2010-08-12 DIAGNOSIS — K7290 Hepatic failure, unspecified without coma: Secondary | ICD-10-CM | POA: Insufficient documentation

## 2010-08-12 DIAGNOSIS — K2970 Gastritis, unspecified, without bleeding: Secondary | ICD-10-CM | POA: Insufficient documentation

## 2010-08-12 DIAGNOSIS — K7682 Hepatic encephalopathy: Secondary | ICD-10-CM | POA: Insufficient documentation

## 2010-08-12 DIAGNOSIS — I10 Essential (primary) hypertension: Secondary | ICD-10-CM | POA: Insufficient documentation

## 2010-08-12 DIAGNOSIS — I8500 Esophageal varices without bleeding: Secondary | ICD-10-CM | POA: Insufficient documentation

## 2010-08-12 DIAGNOSIS — K2990 Gastroduodenitis, unspecified, without bleeding: Secondary | ICD-10-CM | POA: Insufficient documentation

## 2010-08-12 DIAGNOSIS — K259 Gastric ulcer, unspecified as acute or chronic, without hemorrhage or perforation: Secondary | ICD-10-CM | POA: Insufficient documentation

## 2010-08-12 DIAGNOSIS — K709 Alcoholic liver disease, unspecified: Secondary | ICD-10-CM | POA: Insufficient documentation

## 2010-08-18 ENCOUNTER — Telehealth: Payer: Self-pay | Admitting: Internal Medicine

## 2010-08-20 NOTE — Miscellaneous (Signed)
Summary: [facility]   [facility]   Imported By: Florinda Marker 08/12/2010 14:32:35  _____________________________________________________________________  External Attachment:    Type:   Image     Comment:   External Document

## 2010-08-20 NOTE — Progress Notes (Signed)
Summary: speak to nurse    Phone Note Call from Patient Call back at [phone]   Caller: Spouse shirley Call For: Dr Leone Payor Reason for Call: Talk to Nurse Summary of Call: Wife wants to speak to nurse regarding appt proc that her husband is scheduled for tomorrow but has not followed instructions. Initial call taken by: Tawni Levy,  August 11, 2010 3:19 PM  Follow-up for Phone Call        Left message for patient to call back Darcey Nora RN, [facility]  August 11, 2010 3:37 PM  I did speak with the patient and all questions were answered. Follow-up by: Darcey Nora RN, CGRN,  August 12, 2010 11:08 AM

## 2010-08-20 NOTE — Procedures (Addendum)
Summary: Upper Endoscopy  Patient: [name] Note: All result statuses are Final unless otherwise noted.  Tests: (1) Upper Endoscopy (EGD)   EGD Upper Endoscopy       DONE     [facility]     [address] [place], [place]  [address]          ENDOSCOPY PROCEDURE REPORT          PATIENT:  [name], [name]  MR#:  [id number]     BIRTHDATE:  02-27-1966, 44 yrs. old  GENDER:  male          ENDOSCOPIST:  Iva Boop, MD, [facility]          PROCEDURE DATE:  08/12/2010     PROCEDURE:  EGD with biopsy, 19147     ASA CLASS:  Class III     INDICATIONS:  follow-up of gastric ulcer, Known esophageal     varices, for surveillance exam. GI bleed in Jan 2012. Varices     and/or ulcer were cause. For follow-up.          MEDICATIONS:   Fentanyl 50 mcg IV, Versed 5 mg IV     TOPICAL ANESTHETIC:  Cetacaine Spray          DESCRIPTION OF PROCEDURE:   After the risks benefits and     alternatives of the procedure were thoroughly explained, informed     consent was obtained.  The  endoscope was introduced through the     mouth and advanced to the second portion of the duodenum, without     limitations.  The instrument was slowly withdrawn as the mucosa     was fully examined.     <<PROCEDUREIMAGES>>          Minimal, flat varices were found in the distal esophagus.  An     ulcer was found in the antrum. Healing ulcer, not as deep and wide     as in January. There is some surrounding white exudate vs     superficial ulceration. Multiple biopsies were obtained and sent     to pathology.  Otherwise the examination was normal.     Retroflexed views revealed no abnormalities.    The scope was then     withdrawn from the patient and the procedure completed.          COMPLICATIONS:  None          ENDOSCOPIC IMPRESSION:     1) Minimal varices in the distal esophagus - not large enough to     ligate     2) Ulcer in the antrum - healing compared to 07/03/10 findings.  Biopsied.     3) Otherwise normal examination     RECOMMENDATIONS:     1) Await pathology results     2) Will contact with results and plans then. Consider adding     beta blocker.     3) Stay on PPI (pantoprazole currently)          REPEAT EXAM:  In for EGD, pending biopsy results.          Iva Boop, MD, [facility]          CC:  Stacie Glaze, MD     [name], MD     The Patient          n.     eSIGNED:   Iva Boop at 08/12/2010 09:40 AM  [name], [name], [id number]  Note: An exclamation mark (!) indicates a result that was not dispersed into the flowsheet. Document Creation Date: 08/12/2010 9:41 AM _______________________________________________________________________  (1) Order result status: Final Collection or observation date-time: 08/12/2010 09:26 Requested date-time:  Receipt date-time:  Reported date-time:  Referring Physician:   Ordering Physician: Stan Head [phone]) Specimen Source:  Source: Launa Grill Order Number: [id number] Lab site:

## 2010-08-20 NOTE — Assessment & Plan Note (Signed)
Summary: f/u Ov      CC:  f/u ov   no missed doses of meds .  Preventive Screening-Counseling & Management  Alcohol-Tobacco     Alcohol drinks/day: 2     Alcohol type: spirits     Smoking Status: current     Smoking Cessation Counseling: yes     Smoke Cessation Stage: contemplative     Packs/Day: 0.5     Passive Smoke Exposure: yes   Current Allergies: ! PCN Vital Signs:  Patient profile:   45 year old male Height:      72 inches Weight:      197.8 pounds BMI:     26.92 BSA:     2.12 Temp:     98.7 degrees F oral BP sitting:   108 / 70  (left arm)  Vitals Entered By: Starleen Arms CMA (February 26, 2010 2:25 PM) CC: f/u ov   no missed doses of meds  Is Patient Diabetic? No Pain Assessment Patient in pain? no      Nutritional Status BMI of 25 - 29 = overweight Nutritional Status Detail normal   Have you ever been in a relationship where you felt threatened, hurt or afraid?No  Domestic Violence Intervention none  Does patient need assistance? Functional Status Self care Ambulation Normal    Other Orders: Flu Vaccine 61yrs + (57846) Admin 1st Vaccine (96295) Est. Patient Level IV (28413) T-Hepatitis C Antibody (24401-02725) T-Hepatitis B Surface Antigen (36644-03474) T-Hepatitis B Core Antibody (25956-38756) Future Orders: T-Comprehensive Metabolic Panel ([id number]) ... 06/22/2010 T-CBC w/Diff ([id number]) ... 06/22/2010 T-CD4SP ([facility]) (CD4SP) ... 06/22/2010 T-HIV Viral Load [phone]) ... 06/22/2010  Patient Instructions: 1)  Please schedule a follow-up appointment in 3 months. 2)  Be sure to return for lab work one (1) week before your next appointment as scheduled.   Immunizations Administered:  Influenza Vaccine # 1:    Vaccine Type: Fluvax 3+    Site: right deltoid    Mfr: novartis    Dose: 0.5 ml    Route: IM    Given by: Starleen Arms CMA    Exp. Date: 09/13/2010    Lot #: [id number]    VIS given: 01/06/10 version given  February 26, 2010.  Flu Vaccine Consent Questions:    Do you have a history of severe allergic reactions to this vaccine? no    Any prior history of allergic reactions to egg and/or gelatin? no    Do you have a sensitivity to the preservative Thimersol? no    Do you have a past history of Guillan-Barre Syndrome? no    Do you currently have an acute febrile illness? no    Have you ever had a severe reaction to latex? no    Vaccine information given and explained to patient? yes  Process Orders Check Orders Results:     [facility]: ABN not required for this insurance Tests Sent for requisitioning (August 11, 2010 10:25 AM):     06/22/2010: [facility] -- T-Comprehensive Metabolic Panel [[id number]] (signed)     06/22/2010: [facility] -- T-CBC w/Diff [[id number]] (signed)     06/22/2010: [facility] -- T-HIV Viral Load [phone] (signed)     02/26/2010: [facility] -- T-Hepatitis C Antibody [[id number]] (signed)     02/26/2010: [facility] -- T-Hepatitis B Surface Antigen [[id number]] (signed)     02/26/2010: [facility] -- T-Hepatitis B Core Antibody [[id number]] (signed)

## 2010-08-24 ENCOUNTER — Ambulatory Visit (INDEPENDENT_AMBULATORY_CARE_PROVIDER_SITE_OTHER): Payer: Self-pay | Admitting: Internal Medicine

## 2010-08-24 ENCOUNTER — Encounter: Payer: Self-pay | Admitting: Internal Medicine

## 2010-08-24 VITALS — BP 110/70 | HR 76 | Temp 98.2°F | Resp 16 | Ht 72.0 in | Wt 211.0 lb

## 2010-08-24 DIAGNOSIS — M549 Dorsalgia, unspecified: Secondary | ICD-10-CM

## 2010-08-24 DIAGNOSIS — K7030 Alcoholic cirrhosis of liver without ascites: Secondary | ICD-10-CM

## 2010-08-24 LAB — T-HELPER CELL (CD4) - (RCID CLINIC ONLY)
CD4 % Helper T Cell: 28 % — ABNORMAL LOW (ref 33–55)
CD4 T Cell Abs: 230 uL — ABNORMAL LOW (ref 400–2700)

## 2010-08-24 MED ORDER — TRAMADOL HCL 50 MG PO TABS: 50.0000 mg | ORAL_TABLET | Freq: Four times a day (QID) | ORAL | Status: DC | PRN

## 2010-08-24 MED ORDER — FOLBEE PLUS PO TABS: 1.0000 | ORAL_TABLET | Freq: Every day | ORAL | Status: AC

## 2010-08-24 MED ORDER — OLMESARTAN MEDOXOMIL 20 MG PO TABS: 20.0000 mg | ORAL_TABLET | Freq: Every day | ORAL | Status: DC

## 2010-08-24 NOTE — Progress Notes (Signed)
  Subjective:    Patient ID: [name], male    DOB: [birth year], 45 y.o.   MRN: [id number]  HPI The pt has been under increased stress and has not been sleeping well. He fell after being hit by a dog. This morning he got up early and he fell due to spasm in his back. Speech is very tangential and he seems to be subjectively short of breath. He had recent EGD with treatment of the esophageal varicosities   his mother reports that he is increasingly confused his gait is widened and measured and he is forgetful.  There is a possibility that he has not been taking his medicine as he should he is consumed with twice a day of returning to work and going to the gymnasium to work out.    Review of Systems     [name] disappearing white male partially obtunded falls asleep easily in a chair in the office but is easily aroused pupils are equal round reactive to light and accommodation he has scleral icterus he does not complaining of shortness of breath PND orthopnea he denies any chest pain he states his bowel movements have been regular he denies any dysuria but states that he has a sense of urgency when he takes the Lasix he has muscle weakness abdominal bloating neurologically he is confused but oriented to person and place attention is altered/poor Objective:   Physical Exam  on physical examination he is a jaundiced white male moderately confused pupils are equal round reactive to light and accommodation no scleral icterus his neck is supple without JVD or bruit lung fields are clear bilaterally heart examination reveals a regular rate and rhythm abdomen is distended tense extremity examination reveals 1+ edema neurologically he has equal grips a widened gait he is oriented to person and place but inattentive with poor concentration and short-term memory problems       Assessment & Plan:   reinforced the need to take his medication as directed specifically his lactulose must be taken 3-4  times a day recommended that he take it 4 times a day for the next 3 days to aid in clearing his mental status then resume 3 times a day as scheduled we reviewed the necessity of taking the Lasix twice a day with his mother and the patient as well I believe that he has been noncompliant with his medications I do not believe that he has drank again however I will obtain an alcohol level today along with liver function is sent a CBC I suspect that his overall course will be rocky at best and there is a poor prognosis for long-term survival in this patient with issues of compliance the above medications and diet in the setting of chronic liver failure

## 2010-08-24 NOTE — Patient Instructions (Signed)
Increase the lactulose to  4 times a day  for the next 3 days then you always must take the lactulose 3 times a day.  Always take the Lasix twice a day  Avoid extra protein in fact choice take carbohydrates more than protein over the next couple of days

## 2010-08-25 LAB — FOLATE: Folate: >20 ng/mL

## 2010-08-25 LAB — RAPID URINE DRUG SCREEN, HOSP PERFORMED
Amphetamines: NOT DETECTED
Barbiturates: NOT DETECTED
Benzodiazepines: POSITIVE — AB
Cocaine: NOT DETECTED

## 2010-08-25 LAB — COMPREHENSIVE METABOLIC PANEL
ALT: 42 U/L (ref 0–53)
ALT: 44 U/L (ref 0–53)
ALT: 49 U/L (ref 0–53)
ALT: 53 U/L (ref 0–53)
ALT: 55 U/L — ABNORMAL HIGH (ref 0–53)
AST: 61 U/L — ABNORMAL HIGH (ref 0–37)
AST: 107 U/L — ABNORMAL HIGH (ref 0–37)
AST: 61 U/L — ABNORMAL HIGH (ref 0–37)
Albumin: 2.9 g/dL — ABNORMAL LOW (ref 3.5–5.2)
Albumin: 3.5 g/dL (ref 3.5–5.2)
Alkaline Phosphatase: 128 U/L — ABNORMAL HIGH (ref 39–117)
Alkaline Phosphatase: 130 U/L — ABNORMAL HIGH (ref 39–117)
Alkaline Phosphatase: 143 U/L — ABNORMAL HIGH (ref 39–117)
Alkaline Phosphatase: 171 U/L — ABNORMAL HIGH (ref 39–117)
BUN: 10 mg/dL (ref 6–23)
BUN: 30 mg/dL — ABNORMAL HIGH (ref 6–23)
CO2: 20 mEq/L (ref 19–32)
CO2: 19 mEq/L (ref 19–32)
CO2: 20 mEq/L (ref 19–32)
CO2: 21 mEq/L (ref 19–32)
CO2: 23 mEq/L (ref 19–32)
Calcium: 8.3 mg/dL — ABNORMAL LOW (ref 8.4–10.5)
Calcium: 8.8 mg/dL (ref 8.4–10.5)
Calcium: 9.1 mg/dL (ref 8.4–10.5)
Chloride: 105 mEq/L (ref 96–112)
Chloride: 104 mEq/L (ref 96–112)
Creatinine, Ser: 0.74 mg/dL (ref 0.4–1.5)
Creatinine, Ser: 0.82 mg/dL (ref 0.4–1.5)
GFR calc Af Amer: >60 mL/min (ref >60)
GFR calc Af Amer: 57 mL/min — ABNORMAL LOW (ref >60)
GFR calc Af Amer: >60 mL/min (ref >60)
GFR calc non Af Amer: >60 mL/min (ref >60)
GFR calc non Af Amer: 47 mL/min — ABNORMAL LOW (ref >60)
GFR calc non Af Amer: >60 mL/min (ref >60)
GFR calc non Af Amer: >60 mL/min (ref >60)
GFR calc non Af Amer: >60 mL/min (ref >60)
Glucose, Bld: 86 mg/dL (ref 70–99)
Glucose, Bld: 91 mg/dL (ref 70–99)
Glucose, Bld: 99 mg/dL (ref 70–99)
Potassium: 3.8 mEq/L (ref 3.5–5.1)
Potassium: 3.7 mEq/L (ref 3.5–5.1)
Potassium: 3.8 mEq/L (ref 3.5–5.1)
Potassium: 3.9 mEq/L (ref 3.5–5.1)
Potassium: 4.3 mEq/L (ref 3.5–5.1)
Sodium: 135 mEq/L (ref 135–145)
Sodium: 131 mEq/L — ABNORMAL LOW (ref 135–145)
Sodium: 132 mEq/L — ABNORMAL LOW (ref 135–145)
Sodium: 136 mEq/L (ref 135–145)
Total Bilirubin: 5.4 mg/dL — ABNORMAL HIGH (ref 0.3–1.2)
Total Bilirubin: 5.1 mg/dL — ABNORMAL HIGH (ref 0.3–1.2)
Total Bilirubin: 5.3 mg/dL — ABNORMAL HIGH (ref 0.3–1.2)
Total Bilirubin: 5.6 mg/dL — ABNORMAL HIGH (ref 0.3–1.2)
Total Bilirubin: 5.7 mg/dL — ABNORMAL HIGH (ref 0.3–1.2)
Total Protein: 6.2 g/dL (ref 6.0–8.3)
Total Protein: 7.4 g/dL (ref 6.0–8.3)

## 2010-08-25 LAB — CBC
HCT: 26.9 % — ABNORMAL LOW (ref 39.0–52.0)
HCT: 29.1 % — ABNORMAL LOW (ref 39.0–52.0)
HCT: 31.3 % — ABNORMAL LOW (ref 39.0–52.0)
HCT: 32 % — ABNORMAL LOW (ref 39.0–52.0)
Hemoglobin: 9.1 g/dL — ABNORMAL LOW (ref 13.0–17.0)
Hemoglobin: 9.7 g/dL — ABNORMAL LOW (ref 13.0–17.0)
Hemoglobin: 10.2 g/dL — ABNORMAL LOW (ref 13.0–17.0)
Hemoglobin: 10.9 g/dL — ABNORMAL LOW (ref 13.0–17.0)
Hemoglobin: 11 g/dL — ABNORMAL LOW (ref 13.0–17.0)
Hemoglobin: 9.8 g/dL — ABNORMAL LOW (ref 13.0–17.0)
MCH: 33 pg (ref 26.0–34.0)
MCHC: 35.8 g/dL (ref 30.0–36.0)
MCHC: 36.1 g/dL — ABNORMAL HIGH (ref 30.0–36.0)
MCHC: 34.8 g/dL (ref 30.0–36.0)
MCHC: 36.4 g/dL — ABNORMAL HIGH (ref 30.0–36.0)
MCV: 96.2 fL (ref 78.0–100.0)
MCV: 97.4 fL (ref 78.0–100.0)
Platelets: 103 10*3/uL — ABNORMAL LOW (ref 150–400)
Platelets: 91 10*3/uL — ABNORMAL LOW (ref 150–400)
Platelets: 118 10*3/uL — ABNORMAL LOW (ref 150–400)
RBC: 2.81 MIL/uL — ABNORMAL LOW (ref 4.22–5.81)
RBC: 2.94 MIL/uL — ABNORMAL LOW (ref 4.22–5.81)
RDW: 17.4 % — ABNORMAL HIGH (ref 11.5–15.5)
WBC: 10.1 10*3/uL (ref 4.0–10.5)
WBC: 6 10*3/uL (ref 4.0–10.5)
WBC: 6.5 10*3/uL (ref 4.0–10.5)
WBC: 7.3 10*3/uL (ref 4.0–10.5)

## 2010-08-25 LAB — BASIC METABOLIC PANEL
Calcium: 8.6 mg/dL (ref 8.4–10.5)
Chloride: 105 mEq/L (ref 96–112)
Creatinine, Ser: 0.99 mg/dL (ref 0.4–1.5)
GFR calc Af Amer: >60 mL/min (ref >60)
GFR calc non Af Amer: >60 mL/min (ref >60)
Sodium: 131 mEq/L — ABNORMAL LOW (ref 135–145)

## 2010-08-25 LAB — DIFFERENTIAL
Basophils Absolute: 0.1 10*3/uL (ref 0.0–0.1)
Eosinophils Absolute: 0.1 10*3/uL (ref 0.0–0.7)
Lymphocytes Relative: 9 % — ABNORMAL LOW (ref 12–46)
Lymphs Abs: 0.6 10*3/uL — ABNORMAL LOW (ref 0.7–4.0)
Monocytes Absolute: 0.7 10*3/uL (ref 0.1–1.0)
Monocytes Relative: 11 % (ref 3–12)
Neutro Abs: 5 10*3/uL (ref 1.7–7.7)

## 2010-08-25 LAB — HIV-1 RNA QUANT-NO REFLEX-BLD
HIV 1 RNA Quant: <20 copies/mL (ref <20)
HIV-1 RNA Quant, Log: <1.3 {Log} (ref <1.30)

## 2010-08-25 LAB — AMMONIA
Ammonia: 19 umol/L (ref 11–35)
Ammonia: 53 umol/L — ABNORMAL HIGH (ref 11–35)
Ammonia: 36 umol/L — ABNORMAL HIGH (ref 11–35)
Ammonia: 47 umol/L — ABNORMAL HIGH (ref 11–35)
Ammonia: 58 umol/L — ABNORMAL HIGH (ref 11–35)

## 2010-08-25 LAB — T-HELPER CELLS (CD4) COUNT (NOT AT ARMC)
CD4 % Helper T Cell: 29 % — ABNORMAL LOW (ref 33–55)
CD4 % Helper T Cell: 31 % — ABNORMAL LOW (ref 33–55)
CD4 T Cell Abs: 360 uL — ABNORMAL LOW (ref 400–2700)

## 2010-08-25 LAB — ETHANOL
Alcohol, Ethyl (B): <10 mg/dL (ref 0–10)
Alcohol, Ethyl (B): <5 mg/dL (ref 0–10)
Alcohol, Ethyl (B): <5 mg/dL (ref 0–10)

## 2010-08-25 LAB — HIV-1 RNA ULTRAQUANT REFLEX TO GENTYP+: HIV-1 RNA Quant, Log: 1.53 {Log} — ABNORMAL HIGH (ref <1.30)

## 2010-08-25 LAB — CBC WITH DIFFERENTIAL/PLATELET
Hemoglobin: 8.8 g/dL — ABNORMAL LOW (ref 13.0–17.0)
MCHC: 34 g/dL (ref 30.0–36.0)
MCV: 90.6 fl (ref 78.0–100.0)
Platelets: 83 10*3/uL — ABNORMAL LOW (ref 150.0–400.0)

## 2010-08-25 LAB — HEPATITIS B SURFACE ANTIBODY,QUALITATIVE: Hep B S Ab: POSITIVE — AB

## 2010-08-25 LAB — DRUG SCREEN PANEL (SERUM)

## 2010-08-25 LAB — HEPATITIS PANEL, ACUTE
Hep A IgM: NEGATIVE
Hep B C IgM: NEGATIVE
Hepatitis B Surface Ag: NEGATIVE

## 2010-08-25 LAB — HEPATIC FUNCTION PANEL: Albumin: 3.4 g/dL — ABNORMAL LOW (ref 3.5–5.2)

## 2010-08-25 LAB — HEPATITIS A ANTIBODY, TOTAL: Hep A Total Ab: POSITIVE — AB

## 2010-08-25 LAB — VITAMIN B12: Vitamin B-12: >2000 pg/mL — ABNORMAL HIGH (ref 211–911)

## 2010-08-25 LAB — LIPASE, BLOOD: Lipase: 44 U/L (ref 11–59)

## 2010-08-25 LAB — AFP TUMOR MARKER: AFP-Tumor Marker: 3.5 ng/mL (ref 0.0–8.0)

## 2010-08-25 NOTE — Progress Notes (Signed)
Summary: needs beta blocker but asking Dr. Lovell Sheehan to prescribe and manag   Phone Note Outgoing Call   Summary of Call: Let him know ulcer is benign. Continue PPi he is on. I think he should be on a beta blocker and suggest that he be on propranolol or nadolo to reduce risk of bleeding from varices. he is already on 1 BP med so Dr. Lovell Sheehan should be the one to start beta blocker and possibly change/stop the other BP med. we need to communicate this to patient and Dr. Lovell Sheehan - not sure best way since Dr. Lovell Sheehan now working in [employer]. See me in 3-4 months. Iva Boop MD, [facility]  August 18, 2010 5:17 PM    Follow-up for Phone Call        Left message for patient to call back Darcey Nora RN, [facility]  August 19, 2010 9:19 AM  I have updated the patient he will call back to schedule a follow up appointment for this June or July. he is advised of Dr Marvell Fuller recommendations and that he will be hearing from Dr Lovell Sheehan or our office about changes to his medications.   I will fax this over to Dr Lovell Sheehan office for his review. Follow-up by: Darcey Nora RN, CGRN,  August 20, 2010 4:04 PM

## 2010-08-26 ENCOUNTER — Inpatient Hospital Stay (HOSPITAL_COMMUNITY)
Admission: EM | Admit: 2010-08-26 | Discharge: 2010-08-28 | DRG: 441 | Disposition: A | Discharge status: Home / Self Care | Payer: Medicaid Other | Attending: Internal Medicine | Admitting: Internal Medicine

## 2010-08-26 ENCOUNTER — Telehealth: Payer: Self-pay | Admitting: Internal Medicine

## 2010-08-26 DIAGNOSIS — B20 Human immunodeficiency virus [HIV] disease: Secondary | ICD-10-CM | POA: Diagnosis present

## 2010-08-26 DIAGNOSIS — F341 Dysthymic disorder: Secondary | ICD-10-CM | POA: Diagnosis present

## 2010-08-26 DIAGNOSIS — K7290 Hepatic failure, unspecified without coma: Principal | ICD-10-CM | POA: Diagnosis present

## 2010-08-26 DIAGNOSIS — K7030 Alcoholic cirrhosis of liver without ascites: Secondary | ICD-10-CM | POA: Diagnosis present

## 2010-08-26 DIAGNOSIS — D61818 Other pancytopenia: Secondary | ICD-10-CM | POA: Diagnosis present

## 2010-08-26 DIAGNOSIS — F1020 Alcohol dependence, uncomplicated: Secondary | ICD-10-CM | POA: Diagnosis present

## 2010-08-26 DIAGNOSIS — G609 Hereditary and idiopathic neuropathy, unspecified: Secondary | ICD-10-CM | POA: Diagnosis present

## 2010-08-26 DIAGNOSIS — K7682 Hepatic encephalopathy: Principal | ICD-10-CM | POA: Diagnosis present

## 2010-08-26 DIAGNOSIS — I10 Essential (primary) hypertension: Secondary | ICD-10-CM | POA: Diagnosis present

## 2010-08-26 LAB — URINALYSIS, ROUTINE W REFLEX MICROSCOPIC
Bilirubin Urine: NEGATIVE
Glucose, UA: NEGATIVE mg/dL
Hgb urine dipstick: NEGATIVE
Ketones, ur: NEGATIVE mg/dL
Nitrite: NEGATIVE
Protein, ur: NEGATIVE mg/dL
Specific Gravity, Urine: 1.017 (ref 1.005–1.030)
Urobilinogen, UA: 1 mg/dL (ref 0.0–1.0)
pH: 7 (ref 5.0–8.0)

## 2010-08-26 NOTE — Telephone Encounter (Signed)
Pts mom called and said that pt is anemic and can not make own decisions. Pts mom is req that Dr Lovell Sheehan or nurse calls her back re: lab results. Pt listened to lab results, can not comprehend what to do.

## 2010-08-26 NOTE — Telephone Encounter (Signed)
MOM INFORMED PT NEEDS TO GO TO ER

## 2010-08-27 LAB — DIFFERENTIAL
Basophils Relative: 1 % (ref 0–1)
Eosinophils Relative: 4 % (ref 0–5)
Lymphocytes Relative: 27 % (ref 12–46)
Lymphs Abs: 0.9 10*3/uL (ref 0.7–4.0)
Monocytes Relative: 19 % — ABNORMAL HIGH (ref 3–12)
Neutro Abs: 1.6 10*3/uL — ABNORMAL LOW (ref 1.7–7.7)

## 2010-08-27 LAB — COMPREHENSIVE METABOLIC PANEL
ALT: 40 U/L (ref 0–53)
ALT: 39 U/L (ref 0–53)
AST: 67 U/L — ABNORMAL HIGH (ref 0–37)
Alkaline Phosphatase: 148 U/L — ABNORMAL HIGH (ref 39–117)
BUN: 10 mg/dL (ref 6–23)
BUN: 10 mg/dL (ref 6–23)
CO2: 21 mEq/L (ref 19–32)
CO2: 24 mEq/L (ref 19–32)
Calcium: 8.6 mg/dL (ref 8.4–10.5)
Calcium: 8.6 mg/dL (ref 8.4–10.5)
Chloride: 109 mEq/L (ref 96–112)
GFR calc Af Amer: >60 mL/min (ref >60)
GFR calc non Af Amer: >60 mL/min (ref >60)
Glucose, Bld: 114 mg/dL — ABNORMAL HIGH (ref 70–99)
Potassium: 4 mEq/L (ref 3.5–5.1)
Sodium: 134 mEq/L — ABNORMAL LOW (ref 135–145)
Sodium: 137 mEq/L (ref 135–145)
Total Bilirubin: 2.6 mg/dL — ABNORMAL HIGH (ref 0.3–1.2)
Total Protein: 5.4 g/dL — ABNORMAL LOW (ref 6.0–8.3)

## 2010-08-27 LAB — CBC
HCT: 24.9 % — ABNORMAL LOW (ref 39.0–52.0)
Hemoglobin: 8.3 g/dL — ABNORMAL LOW (ref 13.0–17.0)
Hemoglobin: 8.3 g/dL — ABNORMAL LOW (ref 13.0–17.0)
MCH: 28.8 pg (ref 26.0–34.0)
MCHC: 33.3 g/dL (ref 30.0–36.0)
MCHC: 33.1 g/dL (ref 30.0–36.0)
MCV: 86.5 fL (ref 78.0–100.0)
RBC: 2.88 MIL/uL — ABNORMAL LOW (ref 4.22–5.81)
RDW: 23 % — ABNORMAL HIGH (ref 11.5–15.5)
WBC: 3.2 10*3/uL — ABNORMAL LOW (ref 4.0–10.5)

## 2010-08-27 LAB — GLUCOSE, CAPILLARY: Glucose-Capillary: 99 mg/dL (ref 70–99)

## 2010-08-27 LAB — AMMONIA
Ammonia: 97 umol/L — ABNORMAL HIGH (ref 11–35)
Ammonia: 34 umol/L (ref 11–35)

## 2010-08-28 LAB — COMPREHENSIVE METABOLIC PANEL
ALT: 40 U/L (ref 0–53)
AST: 75 U/L — ABNORMAL HIGH (ref 0–37)
Alkaline Phosphatase: 136 U/L — ABNORMAL HIGH (ref 39–117)
CO2: 21 mEq/L (ref 19–32)
Chloride: 112 mEq/L (ref 96–112)
GFR calc Af Amer: >60 mL/min (ref >60)
GFR calc non Af Amer: >60 mL/min (ref >60)
Sodium: 137 mEq/L (ref 135–145)
Total Bilirubin: 2.4 mg/dL — ABNORMAL HIGH (ref 0.3–1.2)

## 2010-08-28 LAB — CBC
HCT: 25.8 % — ABNORMAL LOW (ref 39.0–52.0)
Hemoglobin: 8.7 g/dL — ABNORMAL LOW (ref 13.0–17.0)
MCH: 29.6 pg (ref 26.0–34.0)
MCHC: 33.7 g/dL (ref 30.0–36.0)
MCV: 87.8 fL (ref 78.0–100.0)
Platelets: 70 10*3/uL — ABNORMAL LOW (ref 150–400)
RBC: 2.94 MIL/uL — ABNORMAL LOW (ref 4.22–5.81)
RDW: 22.9 % — ABNORMAL HIGH (ref 11.5–15.5)
WBC: 3.6 10*3/uL — ABNORMAL LOW (ref 4.0–10.5)

## 2010-08-28 LAB — AMMONIA: Ammonia: 66 umol/L — ABNORMAL HIGH (ref 11–35)

## 2010-08-30 LAB — COMPREHENSIVE METABOLIC PANEL
ALT: 29 U/L (ref 0–53)
AST: 105 U/L — ABNORMAL HIGH (ref 0–37)
Alkaline Phosphatase: 109 U/L (ref 39–117)
CO2: 21 mEq/L (ref 19–32)
Calcium: 8.4 mg/dL (ref 8.4–10.5)
GFR calc Af Amer: >60 mL/min (ref >60)
Potassium: 3.6 mEq/L (ref 3.5–5.1)
Sodium: 139 mEq/L (ref 135–145)
Total Protein: 7.6 g/dL (ref 6.0–8.3)

## 2010-08-30 LAB — CBC
HCT: 26.7 % — ABNORMAL LOW (ref 39.0–52.0)
MCHC: 33.7 g/dL (ref 30.0–36.0)
MCV: 92.3 fL (ref 78.0–100.0)
Platelets: 108 10*3/uL — ABNORMAL LOW (ref 150–400)
RDW: 18.5 % — ABNORMAL HIGH (ref 11.5–15.5)
WBC: 7.4 10*3/uL (ref 4.0–10.5)

## 2010-08-30 LAB — DIFFERENTIAL
Basophils Absolute: 0 10*3/uL (ref 0.0–0.1)
Eosinophils Absolute: 0.2 10*3/uL (ref 0.0–0.7)
Eosinophils Relative: 3 % (ref 0–5)
Lymphocytes Relative: 21 % (ref 12–46)
Lymphs Abs: 1.6 10*3/uL (ref 0.7–4.0)
Monocytes Relative: 11 % (ref 3–12)
Neutro Abs: 4.8 10*3/uL (ref 1.7–7.7)

## 2010-08-30 LAB — TRICYCLICS SCREEN, URINE: TCA Scrn: POSITIVE — AB

## 2010-08-30 LAB — RAPID URINE DRUG SCREEN, HOSP PERFORMED
Amphetamines: NOT DETECTED
Opiates: NOT DETECTED
Tetrahydrocannabinol: NOT DETECTED

## 2010-08-31 ENCOUNTER — Ambulatory Visit (INDEPENDENT_AMBULATORY_CARE_PROVIDER_SITE_OTHER)
Admission: RE | Admit: 2010-08-31 | Discharge: 2010-08-31 | Disposition: A | Discharge status: Home / Self Care | Payer: Self-pay | Source: Ambulatory Visit | Attending: Internal Medicine | Admitting: Internal Medicine

## 2010-08-31 ENCOUNTER — Ambulatory Visit (INDEPENDENT_AMBULATORY_CARE_PROVIDER_SITE_OTHER): Payer: Self-pay | Admitting: Internal Medicine

## 2010-08-31 VITALS — BP 96/60 | HR 80 | Temp 98.7°F | Resp 20 | Wt 226.0 lb

## 2010-08-31 DIAGNOSIS — I10 Essential (primary) hypertension: Secondary | ICD-10-CM

## 2010-08-31 DIAGNOSIS — K7290 Hepatic failure, unspecified without coma: Secondary | ICD-10-CM

## 2010-08-31 DIAGNOSIS — M545 Low back pain: Secondary | ICD-10-CM

## 2010-08-31 DIAGNOSIS — B20 Human immunodeficiency virus [HIV] disease: Secondary | ICD-10-CM

## 2010-08-31 DIAGNOSIS — K709 Alcoholic liver disease, unspecified: Secondary | ICD-10-CM

## 2010-08-31 DIAGNOSIS — R188 Other ascites: Secondary | ICD-10-CM

## 2010-08-31 DIAGNOSIS — I8501 Esophageal varices with bleeding: Secondary | ICD-10-CM

## 2010-08-31 MED ORDER — FUROSEMIDE 20 MG PO TABS: 20.0000 mg | ORAL_TABLET | Freq: Two times a day (BID) | ORAL | Status: DC

## 2010-08-31 MED ORDER — DIAZEPAM 5 MG PO TABS: 5.0000 mg | ORAL_TABLET | Freq: Two times a day (BID) | ORAL | Status: DC | PRN

## 2010-08-31 MED ORDER — AMITRIPTYLINE HCL 25 MG PO TABS: 25.0000 mg | ORAL_TABLET | Freq: Every day | ORAL | Status: DC

## 2010-08-31 MED ORDER — LACTULOSE 10 GM/15ML PO SOLN: 20.0000 g | Freq: Four times a day (QID) | ORAL | Status: DC

## 2010-08-31 NOTE — Patient Instructions (Signed)
Did not take any other blood pressure medication other than the furosemide 20 mg daily.  Continue taking her lactulose at least 4 times a day or as he did not have a loose stool every day increase the lactulose by an extra dose

## 2010-09-01 ENCOUNTER — Encounter: Payer: Self-pay | Admitting: Internal Medicine

## 2010-09-01 LAB — CBC WITH DIFFERENTIAL/PLATELET
Basophils Relative: 1 % (ref 0.0–3.0)
Hemoglobin: 9.4 g/dL — ABNORMAL LOW (ref 13.0–17.0)
MCHC: 33.8 g/dL (ref 30.0–36.0)
MCV: 90.2 fl (ref 78.0–100.0)
Platelets: 86 10*3/uL — ABNORMAL LOW (ref 150.0–400.0)
RBC: 3.08 Mil/uL — ABNORMAL LOW (ref 4.22–5.81)

## 2010-09-01 NOTE — Assessment & Plan Note (Signed)
Stable viral load and cd4

## 2010-09-01 NOTE — Assessment & Plan Note (Signed)
Hypotensive due to volume loss and anemia Hold ARB

## 2010-09-01 NOTE — Assessment & Plan Note (Signed)
Advanced stage liver dz

## 2010-09-01 NOTE — Assessment & Plan Note (Signed)
Variable compliance with lactulose and noted spikes in ammonia This could be related to the esophageal bleeding as well Increased to QID LACTULOSEgoal is two or more loose stools a day

## 2010-09-01 NOTE — Assessment & Plan Note (Signed)
Continue lasix, monitor potassium and creatinine

## 2010-09-01 NOTE — Progress Notes (Signed)
  Subjective:    Patient ID: [name], male    DOB: 1966-03-26, 45 y.o.   MRN: [id number]  HPI Pt presents with increased lethargy, back pain and SOB with mouth breathing 3 days after admission and discharge from hospital for encephalopathy in setting of severe anemia following treatment of esophageal varix out pt. Pts mother reports sleeping all the time, confused about meds. Was given flexeril form the admission and this has created increased confusion and lethargy   Review of Systems  Constitutional: Positive for activity change, appetite change and fatigue.  HENT: Positive for congestion.   Eyes: Negative.   Respiratory: Positive for shortness of breath. Negative for wheezing.   Cardiovascular: Negative for chest pain and leg swelling.  Gastrointestinal: Positive for abdominal distention.  Genitourinary: Negative.   Musculoskeletal: Positive for arthralgias.  Neurological: Positive for weakness and light-headedness.  Psychiatric/Behavioral: Positive for confusion, dysphoric mood and decreased concentration.   Past Medical History  Diagnosis Date  . HIV DISEASE 06/23/2006  . HYPERLIPIDEMIA 06/23/2006  . ANXIETY 06/23/2006  . DEPRESSION 06/23/2006  . 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 HYPERTENSION 06/23/2006  . 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 Acute and chronic cholecystitis 05/13/2010  . 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 Other ascites 11/13/2009  . STRAIN, CHEST WALL 03/15/2007   Past Surgical History  Procedure Date  . Esophageal varice ligation   . Carpal tunnel release     reports that he has been smoking Cigarettes.  He has a 5 pack-year smoking history. He has never used smokeless tobacco. He reports that he does not drink alcohol or use illicit drugs. family history includes Drug abuse in his brother; Hyperlipidemia in his mother; and Hypertension in his father and mother. Allergies    Allergen Reactions  . Penicillins     REACTION: hives        Objective:   Physical Exam  Constitutional:       confused , disheveled  HENT:  Head: Normocephalic and atraumatic.  Eyes:       Injected conjunctavi  Neck: Normal range of motion. Neck supple.  Cardiovascular: Normal rate and regular rhythm.   Pulmonary/Chest: Breath sounds normal.  Abdominal: He exhibits distension. There is tenderness.  Musculoskeletal: He exhibits edema.  Psychiatric: Cognition and memory are impaired. He exhibits abnormal recent memory and abnormal remote memory.          Assessment & Plan:

## 2010-09-02 ENCOUNTER — Other Ambulatory Visit: Payer: Self-pay | Admitting: *Deleted

## 2010-09-02 DIAGNOSIS — K7290 Hepatic failure, unspecified without coma: Secondary | ICD-10-CM

## 2010-09-02 DIAGNOSIS — R188 Other ascites: Secondary | ICD-10-CM

## 2010-09-02 LAB — T-HELPER CELL (CD4) - (RCID CLINIC ONLY)
CD4 % Helper T Cell: 33 % (ref 33–55)
CD4 T Cell Abs: 590 uL (ref 400–2700)

## 2010-09-02 MED ORDER — LACTULOSE 10 GM/15ML PO SOLN: 20.0000 g | Freq: Four times a day (QID) | ORAL | Status: DC

## 2010-09-03 ENCOUNTER — Telehealth: Payer: Self-pay | Admitting: Internal Medicine

## 2010-09-03 NOTE — Telephone Encounter (Signed)
I have left a message for the patient that he is due for REV in June or July

## 2010-09-07 ENCOUNTER — Telehealth: Payer: Self-pay | Admitting: Internal Medicine

## 2010-09-07 NOTE — Telephone Encounter (Signed)
Patient called because his mother has been trying to push him to make an appointment.  He has some lower back pain after he moved some boxes and he wanted [place] to tell his mother that this is not coming from his liver.  His mother did speak with me and says his abdomen in swollen, but he didn't report any symptoms to me except lower back pain.  He is trying to get an appointment with a chiropractor.  He is asked to schedule an appointment here for Gi concerns.  He verbalized understanding.

## 2010-09-08 NOTE — Discharge Summary (Signed)
[name], [name]               ACCOUNT NO.:  [id number]  MEDICAL RECORD NO.:  [id number]           PATIENT TYPE:  I  LOCATION:  1516                         FACILITY:  [facility]  PHYSICIAN:  Erick Blinks, MD     DATE OF BIRTH:  1965-10-23  DATE OF ADMISSION:  08/26/2010 DATE OF DISCHARGE:                              DISCHARGE SUMMARY   PRIMARY CARE PHYSICIAN:  Stacie Glaze, MD  GASTROENTEROLOGIST:  Iva Boop, MD, [facility]  INFECTIOUS DISEASE:  Fransisco Hertz, MD  DISCHARGE DIAGNOSES: 1. Hepatic encephalopathy, improved. 2. Alcoholic liver disease. 3. HIV. 4. Pancytopenia likely secondary to alcoholic liver disease. 5. Hypertension. 6. History of gastrointestinal bleed.  DISCHARGE MEDICATIONS: 1. Bactrim DS 1 tablet p.o. daily. 2. Atripla 1 tablet p.o. q.h.s. 3. Xanax 0.5 mg 1-2 tablets p.o. t.i.d. p.r.n. for anxiety. 4. Spironolactone 25 mg p.o. daily. 5. Diovan hydrochlorothiazide 160/12.5 mg 1/2 tablet by mouth every     morning. 6. Amitriptyline 25 mg p.o. q.h.s. 7. Metanx 1 tablet p.o. daily 8. Eye allergy relief drops over-the-counter 1 drop both eyes q.i.d.     p.r.n. 9. Nicorette gum 4 mg 1 piece p.o. daily p.r.n. 10.Lactulose 45 mL p.o. q.i.d. 11.Cialis 5 mg 1 tablet p.o. daily p.r.n. 12.Protonix 40 mg 1 tablet p.o. daily. 13.Citalopram 20 mg p.o. daily. 14.Flexeril 5 mg p.o. b.i.d. p.r.n.  ADMISSION HISTORY:  This is a 45 year old gentleman with alcoholic liver cirrhosis, HIV, and history of hepatic encephalopathy, who presented to the emergency room with increasing confusion.  He was told by his primary care physician to go to the emergency room after he complained of weakness and confusion.  In the ER, he was found to have high ammonia level of 97.  He was also found to be Hemoccult negative.  The patient was subsequently admitted for hepatic encephalopathy.  HOSPITAL COURSE: 1. Hepatic encephalopathy.  The patient was continued on  lactulose.     This was changed from t.i.d. to q.i.d.  He was also started on     Xifaxan.  He reports that he had not taken his lactulose in a few     days and he had become increasingly constipated at home.  The     patient was continued on lactulose here in the hospital as well as     started on Xifaxan.  His mental status significantly improved.  He     is no longer confused.  His weakness has also improved and he is     walking without any unsteadiness.  Due to insurance issues, he     likely will not be able to afford the Xifaxan and we will continue     him on lactulose and he may follow up with Dr. Leone Payor for further     recommendations to see if Burman Blacksmith will be appropriate for him. 2. HIV.  The patient was continued on Atripla.  He will follow up with     Dr. Maurice March for further recommendations. 3. Pancytopenia.  The patient was found to be pancytopenic likely     secondary to his underlying liver disease  as well as possibly his     HIV.  His hemoglobin has remained stable.  He will not require any     transfusion.  He can follow up with his primary care physician for     further management.  He will be continued on PPIs.  CONSULTATIONS:  None.  PROCEDURES:  None.  DIAGNOSTIC IMAGING:  None.  DISCHARGE INSTRUCTIONS:  The patient should continue on a low-salt diet, conduct his activities tolerated.  He will be discharged home and he is living with his parents.  He should follow up with Dr. Leone Payor as well as Dr. Maurice March within 1 week.  He should also follow up with Dr. Lovell Sheehan next week as well.  He has been advised to make these appointments.  CONDITION AT THE TIME OF DISCHARGE:  Improved.  Time spent on this discharge is 45 minutes.     Erick Blinks, MD     JM/MEDQ  D:  08/28/2010  T:  08/28/2010  Job:  [id number]  cc:   Iva Boop, MD,FACG [facility] [address] [place], [place] [address]  Fransisco Hertz, M.D. Fax: [phone]  Stacie Glaze,  MD [address] [place] [address]  Electronically Signed by Erick Blinks  on 09/08/2010 06:23:31 PM

## 2010-09-09 ENCOUNTER — Encounter: Payer: Self-pay | Admitting: Internal Medicine

## 2010-09-09 ENCOUNTER — Telehealth: Payer: Self-pay | Admitting: Internal Medicine

## 2010-09-09 ENCOUNTER — Encounter: Payer: Self-pay | Admitting: *Deleted

## 2010-09-09 NOTE — Telephone Encounter (Signed)
Please call pt and tell him his letters are ready for pick up

## 2010-09-09 NOTE — Telephone Encounter (Signed)
Pt has 3 ct appearances and pt is going to need 3 originals letters, stating that he is not going to be able to appear in court at the present time. Pt says that a copy would not accepted by court. Pt is req to pick up letters this week. Pls contact pt when ready for pick up.

## 2010-09-09 NOTE — Telephone Encounter (Signed)
Please give more up patient about the nature of this letter to [facility] he usually such letters are requested by Medicaid and I have not seen this letter. What specific information does he need to whom the should it  be sent, when should it be sent  and does it need to contain any copies of his chart.  He has a medication for panic I believe Ativan in the past when he got the diazepam for his muscle relaxant we stopped the Ativan if he has completed that the diazepam we can resume the Ativan for panic and anxiety and a refill may be called into his pharmacy. He cannot take both medications at the same time.

## 2010-09-09 NOTE — Telephone Encounter (Signed)
Wants to see Dr Lovell Sheehan. Need a doctor's note for medicaid. Wants Dr Lovell Sheehan to call him. Medicaid note must be sighed only by Dr Lovell Sheehan. Having panic attacks. Please advise.

## 2010-09-10 ENCOUNTER — Telehealth: Payer: Self-pay | Admitting: Internal Medicine

## 2010-09-10 NOTE — Telephone Encounter (Signed)
Pt informed that letters are ready for pick up.

## 2010-09-10 NOTE — Telephone Encounter (Signed)
I told him to stop the diovan due to hypotension

## 2010-09-10 NOTE — Telephone Encounter (Signed)
Pt called and has questions Diovan. Pt has samples of med. Dr Lovell Sheehan told pt to take this med, but its not on pts med list. Pls advise.

## 2010-09-10 NOTE — Telephone Encounter (Signed)
Please advise 

## 2010-09-10 NOTE — Telephone Encounter (Signed)
Left message on machine diovan was stopped due to hypotension

## 2010-09-14 ENCOUNTER — Ambulatory Visit: Payer: Medicaid Other | Admitting: Family Medicine

## 2010-09-18 LAB — T-HELPER CELL (CD4) - (RCID CLINIC ONLY)
CD4 % Helper T Cell: 36 % (ref 33–55)
CD4 T Cell Abs: 480 uL (ref 400–2700)

## 2010-09-24 ENCOUNTER — Telehealth: Payer: Self-pay | Admitting: *Deleted

## 2010-09-24 NOTE — H&P (Signed)
[name], [name]               ACCOUNT NO.:  [id number]  MEDICAL RECORD NO.:  [id number]           PATIENT TYPE:  E  LOCATION:  WLED                         FACILITY:  [facility]  PHYSICIAN:  Massie Maroon, MD        DATE OF BIRTH:  [birth year]  DATE OF ADMISSION:  08/26/2010 DATE OF DISCHARGE:                             HISTORY & PHYSICAL   CHIEF COMPLAINT:  A 45 year old male with alcoholics liver cirrhosis, HIV, hepatic encephalopathy, hypertension, history of GI bleeding, status post variceal bleed June 26, 2010, apparently presents with increased confusion.  He was told by his primary care physician to go to the emergency room after complaining of weakness and confusion.  The patient presented to the ED and his ammonia was high, it was 97.  It was apparently worse than baseline.  The patient was Hemoccult stool negative.  His urinalysis was negative.  The patient will be admitted for hepatic encephalopathy, anemia and pancytopenia.  PAST MEDICAL HISTORY: 1. Variceal bleed, status post banding, June 26, 2010. 2. Anemia. 3. 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 4. Hypertension. 5. Anxiety and depression. 6. History of alcoholism. 7. Peripheral neuropathy.  PAST SURGICAL HISTORY:  None per patient, though there is a noted surgery on October 04, 1998 for release of left carpal tunnel ligament.  SOCIAL HISTORY:  The patient does not smoke.  He is a former drinker. He admits to drinking last week which may refer this episode of hepatic lobe encephalopathy.  FAMILY HISTORY:  Great uncle passed from liver disease.  ALLERGIES:  PENICILLIN.  MEDICATIONS: 1. Unknown ? Atripla 600/200/300 mg one p.o. at bedtime. 2. Citalopram 20 mg p.o. daily. 3. Diovan/hydrochlorothiazide 160/12.5 one half p.o. daily. 4. Cialis 5 mg p.o. daily p.r.n. 5. Spironolactone  50 mg one half p.o. daily. 6. Elavil 25 mg p.o. at bedtime 7. Metanx 3/35/2 mg 1 p.o. daily. 8. Lorazepam 0.5 mg p.o. b.i.d. p.r.n. 9. Kristalose 10 g pack 45 mL p.o. t.i.d. 10.Prilosec 40 mg p.o. daily.  REVIEW OF SYSTEMS:  Negative for all 10 organ systems except for pertinent positives stated above.  PHYSICAL EXAMINATION:  VITAL SIGNS:  Temperature 98.2, pulse 87, blood pressure 123/76, pulse ox is 99% on room air. HEENT:  Anicteric. NECK:  No JVD, no bruit. HEART:  Regular rate and rhythm.  S1 and S2. LUNGS:  Clear to auscultation bilaterally. ABDOMEN:  Soft, nontender, nondistended.  Positive bowel sounds. EXTREMITIES:  No cyanosis, clubbing or edema. SKIN:  No palmar erythema, no asterixis. LYMPH NODES:  No adenopathy. NEURO EXAM:  Nonfocal.  LABORATORY DATA:  WBC 3.2, hemoglobin 8.3, platelet count is 73.  Sodium 134, potassium 3.6, AST 75, ALT 40, albumin 2.8, alk phos 148, total bilirubin 2.7, ammonia level 97.  Occult blood, Hemoccult blood negative.  Urinalysis negative.  ASSESSMENT/PLAN: 1. Hepatic encephalopathy, increased lactulose, avoid alcohol.  Check     CMP, ammonia level in the morning. 2. Human immunodeficiency virus, continue Atripla, consider consulting     infectious  disease. 3. Gastroesophageal reflux disease. 4. History of gastrointestinal bleed secondary to varices, status post     banding:  Prilosec. 5. Hypertension.  Continue Diovan and hydrochlorothiazide. 6. Deep venous thrombosis prophylaxis.  SCDs.     Massie Maroon, MD     JYK/MEDQ  D:  08/27/2010  T:  08/27/2010  Job:  [id number]  cc:   Stacie Glaze, MD [address] [place] [address]  Electronically Signed by Pearson Grippe MD on 09/24/2010 01:38:39 AM

## 2010-09-24 NOTE — Telephone Encounter (Signed)
Pt states he needs to speak with [place] in regards to his Medicaid?

## 2010-09-25 NOTE — Telephone Encounter (Signed)
Pt is requesting an ov with dr Lovell Sheehan

## 2010-09-28 LAB — T-HELPER CELL (CD4) - (RCID CLINIC ONLY)
CD4 % Helper T Cell: 27 % — ABNORMAL LOW (ref 33–55)
CD4 T Cell Abs: 300 uL — ABNORMAL LOW (ref 400–2700)

## 2010-10-01 ENCOUNTER — Other Ambulatory Visit: Payer: Self-pay | Admitting: Internal Medicine

## 2010-10-15 ENCOUNTER — Telehealth: Payer: Self-pay | Admitting: Internal Medicine

## 2010-10-15 NOTE — Telephone Encounter (Signed)
I returned a call to the patient he was trying to reach Dr Lovell Sheehan not Dr Leone Payor.

## 2010-10-29 ENCOUNTER — Other Ambulatory Visit (INDEPENDENT_AMBULATORY_CARE_PROVIDER_SITE_OTHER): Payer: Medicaid Other

## 2010-10-29 DIAGNOSIS — B20 Human immunodeficiency virus [HIV] disease: Secondary | ICD-10-CM

## 2010-10-30 LAB — CBC WITH DIFFERENTIAL/PLATELET
Eosinophils Absolute: 0.1 10*3/uL (ref 0.0–0.7)
Eosinophils Relative: 3 % (ref 0–5)
Lymphs Abs: 1.1 10*3/uL (ref 0.7–4.0)
MCH: 28.1 pg (ref 26.0–34.0)
MCHC: 33 g/dL (ref 30.0–36.0)
MCV: 85.1 fL (ref 78.0–100.0)
Neutro Abs: 3 10*3/uL (ref 1.7–7.7)
Platelets: 61 10*3/uL — ABNORMAL LOW (ref 150–400)
RDW: 19.6 % — ABNORMAL HIGH (ref 11.5–15.5)

## 2010-10-30 LAB — COMPLETE METABOLIC PANEL WITH GFR
ALT: 56 U/L — ABNORMAL HIGH (ref 0–53)
Albumin: 3.9 g/dL (ref 3.5–5.2)
CO2: 21 mEq/L (ref 19–32)
Calcium: 8.2 mg/dL — ABNORMAL LOW (ref 8.4–10.5)
Creat: 0.81 mg/dL (ref 0.40–1.50)
GFR, Est African American: >60 mL/min (ref >60)
GFR, Est Non African American: >60 mL/min (ref >60)
Potassium: 3.4 mEq/L — ABNORMAL LOW (ref 3.5–5.3)
Sodium: 141 mEq/L (ref 135–145)
Total Bilirubin: 4.7 mg/dL — ABNORMAL HIGH (ref 0.3–1.2)

## 2010-10-30 LAB — HIV-1 RNA QUANT-NO REFLEX-BLD: HIV-1 RNA Quant, Log: <1.3 {Log} (ref <1.30)

## 2010-10-30 LAB — T-HELPER CELL (CD4) - (RCID CLINIC ONLY): CD4 T Cell Abs: 350 uL — ABNORMAL LOW (ref 400–2700)

## 2010-10-30 NOTE — Op Note (Signed)
   NAME:  [name], [name]                          ACCOUNT NO.:  [id number]   MEDICAL RECORD NO.:  [id number]                   PATIENT TYPE:  AMB   LOCATION:  DSC                                  FACILITY:  [facility]   PHYSICIAN:  Katy Fitch. Naaman Plummer., M.D.          DATE OF BIRTH:  1966-03-03   DATE OF PROCEDURE:  10/04/2002  DATE OF DISCHARGE:                                 OPERATIVE REPORT   PREOPERATIVE DIAGNOSIS:  Entrapment neuropathy, median nerve, left carpal  tunnel.   POSTOPERATIVE DIAGNOSIS:  Entrapment neuropathy, median nerve, left carpal  tunnel.   PROCEDURE:  Release of left transverse carpal ligament.   SURGEON:  Katy Fitch. Sypher, M.D.   ASSISTANT:  Jonni Sanger, P.A.   ANESTHESIA:  General by LMA supervised by the anesthesiologist, Judie Petit, M.D.   INDICATIONS:  The patient is a 45 year old gentleman referred for evaluation  and management of a chronically painful and numb left hand.  Clinical  examination revealed signs of significant carpal tunnel syndrome.  Electrodiagnostic studies by Laurier Nancy, M.D., revealed very  significant carpal tunnel syndrome.  After failure to respond to injection  and splinting, he is brought to the operating room at this time for release  of his left transverse carpal ligament.   DESCRIPTION OF PROCEDURE:  The patient is brought to the operating room and  placed in supine position on the operating table.  Following induction of  general anesthesia, the left arm was prepped with Betadine soap and solution  and sterilely draped.   Following exsanguination of the limb with an Esmarch bandage, the arterial  tourniquet was inflated to 220 mmHg.  The procedure commenced with a short  incision in the line of the ring finger in the palm.  Subcutaneous tissues  were carefully divided to reveal the palmar fascia.  This was split  longitudinally to reveal the common sensory branch of the median nerve.   These were  followed back to the transverse carpal ligament, which was  carefully isolated from the median nerve.  The ligament was then released  along its ulnar border, extending into the distal forearm.  This widely  opened the carpal canal.   Bleeding points along the margin of the released ligament were  electrocauterized with bipolar current, followed by repair of the skin with  intradermal 3-0 Prolene suture.   A compressive dressing applied with a volar plaster splint maintaining the  wrist in 5 degrees of dorsiflexion.                                               Katy Fitch Naaman Plummer., M.D.   RVS/MEDQ  D:  10/04/2002  T:  10/05/2002  Job:  [id number]

## 2010-11-10 ENCOUNTER — Ambulatory Visit: Payer: Medicaid Other | Admitting: Internal Medicine

## 2010-11-11 ENCOUNTER — Other Ambulatory Visit: Payer: Self-pay

## 2010-11-12 ENCOUNTER — Encounter: Payer: Self-pay | Admitting: Infectious Diseases

## 2010-11-12 ENCOUNTER — Ambulatory Visit (INDEPENDENT_AMBULATORY_CARE_PROVIDER_SITE_OTHER): Payer: Medicaid Other | Admitting: Infectious Diseases

## 2010-11-12 VITALS — BP 149/90 | HR 83 | Temp 98.0°F | Ht 72.0 in | Wt 217.5 lb

## 2010-11-12 DIAGNOSIS — F411 Generalized anxiety disorder: Secondary | ICD-10-CM

## 2010-11-12 DIAGNOSIS — B20 Human immunodeficiency virus [HIV] disease: Secondary | ICD-10-CM

## 2010-11-12 DIAGNOSIS — F419 Anxiety disorder, unspecified: Secondary | ICD-10-CM

## 2010-11-12 MED ORDER — ALPRAZOLAM 0.5 MG PO TABS: 0.5000 mg | ORAL_TABLET | Freq: Three times a day (TID) | ORAL | Status: DC

## 2010-11-12 NOTE — Progress Notes (Signed)
Addended by: Starleen Arms D on: 11/12/2010 11:49 AM   Modules accepted: Orders

## 2010-11-12 NOTE — Progress Notes (Signed)
  Subjective:    Patient ID: [name], male    DOB: 28-Jul-1965, 45 y.o.   MRN: [id number]  HPI [name] has HIV of 350 and undectable HIV and adheres to his atripla. He unfortunatley contiunues to drink and had DUI 2 months ago. He notes sleepiness today and suspect he is low grade chronically encephalopathic.No N&V or GI bleeding grossly.  His LFTs are in 140 range and bili 3plus.    Review of Systems  Constitutional: Positive for fatigue. Negative for fever.  Eyes: Negative.   Respiratory: Negative.   Cardiovascular: Negative.   Psychiatric/Behavioral: Negative.        Objective:   Physical Exam  Constitutional: He is oriented to person, place, and time. He appears well-developed.  HENT:  Head: Normocephalic.  Mouth/Throat: Oropharynx is clear and moist.  Eyes: Conjunctivae are normal. Pupils are equal, round, and reactive to light.  Neck: Neck supple.  Cardiovascular: Normal rate, regular rhythm, normal heart sounds and intact distal pulses.   Pulmonary/Chest: Breath sounds normal.  Abdominal:       Distended with likely ascites but no tenderness.  Lymphadenopathy:    He has no cervical adenopathy.  Neurological: He is oriented to person, place, and time. He has normal reflexes. He displays normal reflexes. No cranial nerve deficit. Coordination normal.       Slightly lethargic and mild asterixis          Assessment & Plan:

## 2010-11-12 NOTE — Progress Notes (Signed)
Addended by: Starleen Arms D on: 11/12/2010 12:07 PM   Modules accepted: Orders

## 2010-11-12 NOTE — Patient Instructions (Signed)
Please return in 5 months to see Dr. Maurice March and 2 weeks prior for lab work.

## 2010-11-18 ENCOUNTER — Other Ambulatory Visit: Payer: Self-pay | Admitting: Licensed Clinical Social Worker

## 2010-11-18 DIAGNOSIS — G629 Polyneuropathy, unspecified: Secondary | ICD-10-CM

## 2010-11-18 DIAGNOSIS — B20 Human immunodeficiency virus [HIV] disease: Secondary | ICD-10-CM

## 2010-11-18 MED ORDER — AMITRIPTYLINE HCL 25 MG PO TABS: 25.0000 mg | ORAL_TABLET | Freq: Every day | ORAL | Status: DC

## 2010-11-18 MED ORDER — EFAVIRENZ-EMTRICITAB-TENOFOVIR 600-200-300 MG PO TABS: 1.0000 | ORAL_TABLET | Freq: Every day | ORAL | Status: DC

## 2010-11-20 ENCOUNTER — Ambulatory Visit: Payer: Medicaid Other | Admitting: Internal Medicine

## 2010-11-24 ENCOUNTER — Telehealth: Payer: Self-pay | Admitting: *Deleted

## 2010-11-24 NOTE — Telephone Encounter (Signed)
States he has been taking 50 mg of elavil HS & that he was on that dose before. Told him the computer has orders for 25mg  HS. He has enopugh until Thursday. Told him I will ask Dr. Maurice March if he will change this. I will then refill it. His numbers are [phone] or [phone]

## 2010-11-25 ENCOUNTER — Telehealth: Payer: Self-pay | Admitting: *Deleted

## 2010-11-25 DIAGNOSIS — B20 Human immunodeficiency virus [HIV] disease: Secondary | ICD-10-CM

## 2010-11-25 DIAGNOSIS — G629 Polyneuropathy, unspecified: Secondary | ICD-10-CM

## 2010-11-25 MED ORDER — EFAVIRENZ-EMTRICITAB-TENOFOVIR 600-200-300 MG PO TABS: 1.0000 | ORAL_TABLET | Freq: Every day | ORAL | Status: DC

## 2010-11-25 MED ORDER — AMITRIPTYLINE HCL 25 MG PO TABS: 25.0000 mg | ORAL_TABLET | Freq: Every day | ORAL | Status: DC

## 2010-11-25 NOTE — Telephone Encounter (Signed)
Pt requesting refills and appt.

## 2010-11-26 ENCOUNTER — Telehealth: Payer: Self-pay | Admitting: Internal Medicine

## 2010-11-26 ENCOUNTER — Ambulatory Visit: Payer: Self-pay | Admitting: Infectious Diseases

## 2010-11-26 NOTE — Telephone Encounter (Signed)
Mother reports the patient is unavailable to speak with me.  She reports he has swelling in his abdomen, feet, and face.  His mother has adjusted his lasix and and aldactone.  I have scheduled the patient to come in and see Mike Gip PA tomorrow

## 2010-11-26 NOTE — Telephone Encounter (Signed)
Agree 

## 2010-11-27 ENCOUNTER — Ambulatory Visit (INDEPENDENT_AMBULATORY_CARE_PROVIDER_SITE_OTHER): Payer: Medicaid Other | Admitting: Physician Assistant

## 2010-11-27 ENCOUNTER — Encounter: Payer: Self-pay | Admitting: Physician Assistant

## 2010-11-27 VITALS — BP 120/78 | HR 78 | Ht 72.0 in | Wt 216.2 lb

## 2010-11-27 DIAGNOSIS — R609 Edema, unspecified: Secondary | ICD-10-CM

## 2010-11-27 DIAGNOSIS — K7030 Alcoholic cirrhosis of liver without ascites: Secondary | ICD-10-CM

## 2010-11-27 DIAGNOSIS — R600 Localized edema: Secondary | ICD-10-CM

## 2010-11-27 MED ORDER — ESZOPICLONE 2 MG PO TABS: 2.0000 mg | ORAL_TABLET | Freq: Every day | ORAL | Status: DC

## 2010-11-27 NOTE — Patient Instructions (Addendum)
We  Have given you  a prescription for Lunesta for sleep. You need to take it  to your pharmacy. [facility]. You can stop the Amitriptyline.  Lasix- take 2 tab (20mg ) in the morning.            Take 1 tab (20mg ) at lunchtime.  Keep the appointment with Dr. Leone Payor on  12-21-2010 at 10:45 AM.

## 2010-11-29 ENCOUNTER — Encounter: Payer: Self-pay | Admitting: Physician Assistant

## 2010-11-29 NOTE — Progress Notes (Signed)
Subjective:    Patient ID: [name], male    DOB: 06-20-1965, 45 y.o.   MRN: [id number]  HPI [name] is a 45 year old white male, patient of Dr. Leone Payor with end-stage alcoholic liver disease complicated by esophageal varices and chronic hepatic encephalopathy. He also has HIV. He denies any current alcohol use however review of records shows a note fromDr. Darlina Sicilian in May of 2012 stating the patient had had a DUI .Marland Kitchen Patient relates that he is now on disability and has been somewhat depressed. He comes in today because his mother who is his caregiver was concerned that he was developing increased edema. Apparently he had had a nightmares the other night and may have woken up confused, he is alert and oriented x3 today with no obvious evidence of encephalopathy.  Patient states he has been taking all of his medications as instructed, and brings printed list with him He says he is taking his Chronulac 2 tablespoons 4 times daily. He is onAMITRYPTYLINE, but has not been taking this regularly as it makes him more fatigued and tired during the day He says he has difficulty sleeping and has taken [place] which works well and is requesting a prescription. He does not feel that he has gained weight, and in fact his weight was 217 IN May ,and is 216 today. Patient also mentions that he has what may be a tic on his left arm which he just noticed today. Patient is also concerned because he has been a patient of Dr. Darryll Capers for many years and since he is now on disability he has [place] [employer] and apparently cannot be seen by Dr. Lovell Sheehan area He would like to reestablish this relationship and asks if we can help.    Review of Systems  Constitutional: Positive for fatigue.  HENT: Negative.   Eyes: Negative.   Respiratory: Negative.   Cardiovascular: Negative.   Genitourinary: Negative.   Musculoskeletal: Negative.   Skin: Negative.   Neurological: Negative.   Psychiatric/Behavioral: Positive for  sleep disturbance and dysphoric mood.       Objective:   Physical Exam Well-developed white male in no acute distress, alert and oriented x3 no asterixis. HEENT ;nontraumatic normocephalic EOMI PERRLA sclera anicteric no facial edema  Neck; supple no JVD Cardiovascular; regular rate and rhythm with S1-S2 no murmur rub or gallop  Pulmonary; clear bilaterally  Abdomen; soft no definite fluid wave nontender bowel sounds active, no palpable mass or hepatosplenomegaly  rectal not done   Extremities; Patient has a tiny black object on his left forearm, this is removed with alcohol and tweezers, question tiny tic though not moving. He has 1-2+ edema bilaterally in the feet and ankles to the shins. Skin; benign no jaundice  Psych; mood and affect appropriate.        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  Plan; Continue current medication regimen, will. increased Lasix to 40 mg every morning and 20 mg q. afternoon. Continue Chronulac 30 cc 4 times daily as doing Followup with Dr. Leone Payor in 3 weeks Will check Bmet one-week.  #2 Insomnia Trial of Lunesta at the lowest dosage 1 mg by mouth each bedtime Stop amitriptyline patient; has not been taking.  #3 Possible  tiny? wood tic on left forearm-removed and sent to lab for identification. Patient aware to seek medical attention  should he develop fever, headache, rash etc. On closer exam, I do not think this was a tic.

## 2010-11-30 ENCOUNTER — Telehealth: Payer: Self-pay

## 2010-11-30 ENCOUNTER — Other Ambulatory Visit: Payer: Self-pay | Admitting: *Deleted

## 2010-11-30 DIAGNOSIS — K709 Alcoholic liver disease, unspecified: Secondary | ICD-10-CM

## 2010-11-30 DIAGNOSIS — K7460 Unspecified cirrhosis of liver: Secondary | ICD-10-CM

## 2010-11-30 NOTE — Progress Notes (Signed)
Agree with assessment and plan per Ms. Esterwood. Controlling insomnia will likely be difficult and will probably need other clinical input. Iva Boop, MD, [facility]

## 2010-11-30 NOTE — Telephone Encounter (Signed)
Message copied by Annett Fabian on Mon Nov 30, 2010  8:49 AM ------      Message from: [employer], [place] S      Created: Sun Nov 29, 2010  8:48 AM      Regarding: Rettinger,Keylan       Please have pt come in for a cbc and cmet   In a week ,thanks

## 2010-11-30 NOTE — Telephone Encounter (Signed)
I have left a message for the patient that he needs to come for repeat lab work next Monday or Tuesday.  I have asked him to call back for any questions.

## 2010-12-02 ENCOUNTER — Telehealth: Payer: Self-pay | Admitting: Physician Assistant

## 2010-12-03 NOTE — Telephone Encounter (Signed)
I called the pt and left him a message that I spoke to [facility] about the prior authorization for the Lunesta medication.  They faxed me a form that Amy Esterwood PA has to fill out and we have to fax it back.  Amy won't be back from vacation until Monday 12-07-2010. I told him I would try to fill out the form and and stamp it but it has questions I cannot answer without Amy's input.  I will have Amy fill it out on Monday the 25th.

## 2010-12-04 ENCOUNTER — Telehealth: Payer: Self-pay | Admitting: Internal Medicine

## 2010-12-04 DIAGNOSIS — G629 Polyneuropathy, unspecified: Secondary | ICD-10-CM

## 2010-12-04 MED ORDER — AMITRIPTYLINE HCL 25 MG PO TABS: 25.0000 mg | ORAL_TABLET | Freq: Every day | ORAL | Status: DC

## 2010-12-04 NOTE — Telephone Encounter (Signed)
I called and spoke to the spouse [name], but upon further questioning she is his Aunt.  She reports that per the patient's mother (her sister) he is having problems with sleeping a lot.  He does wake up to eat.  He argues with his mom about taking his lactulose.  They are also asking for a refill on his bactrim (I did review with Dr Christella Hartigan and Willette Cluster RNP if this would be prescribed for cirrhosis and both could not see a reason why).  I have asked the Aunt to have [name] call back to talk to me about his problems. Last week the mom called to report the patient was having swelling in his feet, abdomen and face and sleeping all the time because she was concerned about him but when the patient was seen in the office he denied any of the symptoms his mother stated he was having.  I have instructed the Aunt to have the patient call and if he is unable to wake up to call me or is confused or belligerent and they are unable to get him to take his lactulose and ensure he is having several liquid BMs a day they need to take him to the ER.  The Aunt also reports that the patient's mother told her that he was having nose bleeds.  I have asked her again to have [name] to call me and if he is unable to call or is confused or belligerent and won't take his meds he needs evaluated in the ER.

## 2010-12-04 NOTE — Telephone Encounter (Signed)
Filled with what computer has

## 2010-12-09 ENCOUNTER — Telehealth: Payer: Self-pay | Admitting: Internal Medicine

## 2010-12-09 NOTE — Telephone Encounter (Signed)
Patient wanted to know about his upcoming appt with Dr Leone Payor and if he could pick up his rx from [facility].  He was not able to get the Lunesta that [employer] PA prescribed last week.  I have asked him to check with [facility] and if there is a problem with the rx they will call our office.

## 2010-12-17 ENCOUNTER — Other Ambulatory Visit: Payer: Self-pay | Admitting: Internal Medicine

## 2010-12-21 ENCOUNTER — Encounter: Payer: Self-pay | Admitting: Internal Medicine

## 2010-12-21 ENCOUNTER — Ambulatory Visit (INDEPENDENT_AMBULATORY_CARE_PROVIDER_SITE_OTHER): Payer: Medicaid Other | Admitting: Internal Medicine

## 2010-12-21 ENCOUNTER — Other Ambulatory Visit: Payer: Medicaid Other | Admitting: Internal Medicine

## 2010-12-21 ENCOUNTER — Other Ambulatory Visit: Payer: Self-pay | Admitting: Internal Medicine

## 2010-12-21 ENCOUNTER — Other Ambulatory Visit (INDEPENDENT_AMBULATORY_CARE_PROVIDER_SITE_OTHER): Payer: Medicaid Other

## 2010-12-21 DIAGNOSIS — I8501 Esophageal varices with bleeding: Secondary | ICD-10-CM

## 2010-12-21 DIAGNOSIS — R609 Edema, unspecified: Secondary | ICD-10-CM

## 2010-12-21 DIAGNOSIS — K7030 Alcoholic cirrhosis of liver without ascites: Secondary | ICD-10-CM

## 2010-12-21 DIAGNOSIS — G4700 Insomnia, unspecified: Secondary | ICD-10-CM

## 2010-12-21 DIAGNOSIS — R188 Other ascites: Secondary | ICD-10-CM

## 2010-12-21 HISTORY — DX: Insomnia, unspecified: G47.00

## 2010-12-21 LAB — COMPREHENSIVE METABOLIC PANEL
AST: 74 U/L — ABNORMAL HIGH (ref 0–37)
Alkaline Phosphatase: 137 U/L — ABNORMAL HIGH (ref 39–117)
BUN: 9 mg/dL (ref 6–23)
Calcium: 8.6 mg/dL (ref 8.4–10.5)
Creatinine, Ser: 0.9 mg/dL (ref 0.4–1.5)
GFR: 101.7 mL/min (ref >60.00)
Glucose, Bld: 109 mg/dL — ABNORMAL HIGH (ref 70–99)
Total Bilirubin: 4.4 mg/dL — ABNORMAL HIGH (ref 0.3–1.2)

## 2010-12-21 LAB — PROTIME-INR
INR: 1.4 ratio — ABNORMAL HIGH (ref 0.8–1.0)
Prothrombin Time: 14.9 s — ABNORMAL HIGH (ref 10.2–12.4)

## 2010-12-21 MED ORDER — LACTULOSE 10 GM/15ML PO SOLN: 20.0000 g | Freq: Four times a day (QID) | ORAL | Status: DC

## 2010-12-21 NOTE — Telephone Encounter (Signed)
Medication refilled. Per Dr. Leone Payor ok to give 4000 cc or 4 liters with 5 refills.

## 2010-12-21 NOTE — Assessment & Plan Note (Addendum)
Stable to ? Improved. He reports abstinence from alcohol since brief episode of drinking beer in March 2012 Will ask ID to see if he has had pneumococcal vaccine if not should receive.Current MELD is 15 Mayo and 16 UNOS which indicates 6-29% three month mortality

## 2010-12-21 NOTE — Assessment & Plan Note (Signed)
Improved and not present with increased diuretics

## 2010-12-21 NOTE — Patient Instructions (Signed)
Please go to the basement upon leaving today to have your labs done. Return in 3 months to see Dr. Leone Payor.

## 2010-12-21 NOTE — Progress Notes (Signed)
  Subjective:    Patient ID: [name], male    DOB: [birth year], 45 y.o.   MRN: [id number]  HPI Not sleeping at night but is during the day. Energy level is ok overall. Was unable to get Lunesta prescribed for insomnia as not covered under Medicaid. Thinks edema is better with increased diuretics. Moving bowels 2-3 times a day and loose. Mom is here today and participates in the history.   Review of Systems     Objective:   Physical Exam Developed middle-aged white man, he is jaundiced. Awake alert and oriented x3 there is no asterixis. Lungs are clear Heart S1-S2 no jugular venous distention rubs murmurs or gallops Abdomen somewhat obese but soft and nontender without organomegaly or mass or obvious ascites The lower extremities are free of edema Skin shows some stigmata of chronic liver disease faint spider telangiectasia on the trunk and the upper portion of the anterior chest.       Assessment & Plan:

## 2010-12-21 NOTE — Assessment & Plan Note (Signed)
Good weight loss with increase diuretic dose and not evident on exam.

## 2010-12-21 NOTE — Progress Notes (Signed)
Quick Note:  1) Potassium is mildly low (again) Needs to start potassium chloride 20 meQ daily #30 with 4 refills 2) potassium level in 2 weeks 3) Ask him how he is using alprazolam now - I am having difficulty finding a safe and effective medication for his insomnia ______

## 2010-12-21 NOTE — Assessment & Plan Note (Addendum)
Likely due to liver problems and encephalopathy with day-night cycle reversal. No great options for treatment given liver disease. ? Rozerom but will recheck labs to assess liver function. He stopped amitriptyline as making him groggy though I think this was tx for neuropathy Rozerom is not covered. Will call him and see how he is using alprazolam but that is problematic in liver disease also. May not be much else that can be done.

## 2010-12-23 ENCOUNTER — Telehealth: Payer: Self-pay

## 2010-12-23 DIAGNOSIS — E876 Hypokalemia: Secondary | ICD-10-CM

## 2010-12-23 MED ORDER — POTASSIUM CHLORIDE 20 MEQ PO PACK: 20.0000 meq | PACK | Freq: Every day | ORAL | Status: DC

## 2010-12-23 NOTE — Telephone Encounter (Signed)
He is taking xanax prn only 1 or 2 during the day.  He does not take them at night all.  I have suggested to him that he try xanax before bed and see if this helps with insomnia

## 2010-12-23 NOTE — Telephone Encounter (Signed)
Message copied by Annett Fabian on Wed Dec 23, 2010  9:21 AM ------      Message from: Stan Head E      Created: Mon Dec 21, 2010  7:04 PM       1) Potassium is mildly low (again) Needs to start potassium chloride 20 meQ daily #30 with 4 refills      2) potassium level in 2 weeks      3) Ask him how he is using alprazolam now - I am having difficulty finding a safe and effective medication for his insomnia

## 2010-12-23 NOTE — Telephone Encounter (Signed)
Left message for patient to call back  

## 2010-12-23 NOTE — Telephone Encounter (Signed)
Patient's mom advised.  She is not sure how he is taking his alprazolam.  I have asked her to have him call back.  I have left several messages for him, and she says he is unavailable to speak with me now.  She is aware it it about his insomnia.

## 2010-12-24 NOTE — Telephone Encounter (Signed)
Agree 

## 2010-12-28 ENCOUNTER — Telehealth: Payer: Self-pay | Admitting: Internal Medicine

## 2010-12-28 NOTE — Telephone Encounter (Signed)
Patient needs labs on Wed not today.

## 2010-12-29 ENCOUNTER — Encounter: Payer: Self-pay | Admitting: Internal Medicine

## 2010-12-29 NOTE — Telephone Encounter (Signed)
Error

## 2010-12-30 ENCOUNTER — Other Ambulatory Visit (INDEPENDENT_AMBULATORY_CARE_PROVIDER_SITE_OTHER): Payer: Medicaid Other

## 2010-12-30 DIAGNOSIS — E876 Hypokalemia: Secondary | ICD-10-CM

## 2010-12-30 LAB — POTASSIUM: Potassium: 3.5 mEq/L (ref 3.5–5.1)

## 2010-12-31 NOTE — Progress Notes (Signed)
Quick Note:  K+ level is ok Stay on KCL supplement  Repeat CMET in 1 month re cirrhosis ______

## 2011-01-01 ENCOUNTER — Telehealth: Payer: Self-pay

## 2011-01-01 DIAGNOSIS — K7460 Unspecified cirrhosis of liver: Secondary | ICD-10-CM

## 2011-01-01 NOTE — Telephone Encounter (Signed)
I have left a detailed message for the patient I will remind him of labs in August.

## 2011-01-01 NOTE — Telephone Encounter (Signed)
Message copied by Annett Fabian on Fri Jan 01, 2011 11:15 AM ------      Message from: Stan Head E      Created: Thu Dec 31, 2010 10:20 PM       K+ level is ok      Stay on KCL supplement            Repeat CMET in 1 month re cirrhosis

## 2011-01-04 ENCOUNTER — Ambulatory Visit: Payer: Medicaid Other | Admitting: Internal Medicine

## 2011-01-04 ENCOUNTER — Telehealth: Payer: Self-pay | Admitting: *Deleted

## 2011-01-04 DIAGNOSIS — B20 Human immunodeficiency virus [HIV] disease: Secondary | ICD-10-CM

## 2011-01-04 DIAGNOSIS — Z79899 Other long term (current) drug therapy: Secondary | ICD-10-CM

## 2011-01-04 DIAGNOSIS — Z113 Encounter for screening for infections with a predominantly sexual mode of transmission: Secondary | ICD-10-CM

## 2011-01-04 NOTE — Telephone Encounter (Signed)
Unable to come today.  Rescheduled.  Jennet Maduro, RN

## 2011-01-07 ENCOUNTER — Other Ambulatory Visit (INDEPENDENT_AMBULATORY_CARE_PROVIDER_SITE_OTHER): Payer: Medicaid Other

## 2011-01-07 ENCOUNTER — Other Ambulatory Visit: Payer: Self-pay | Admitting: Infectious Diseases

## 2011-01-07 ENCOUNTER — Ambulatory Visit: Payer: Medicaid Other | Admitting: Infectious Diseases

## 2011-01-07 DIAGNOSIS — B20 Human immunodeficiency virus [HIV] disease: Secondary | ICD-10-CM

## 2011-01-07 DIAGNOSIS — Z113 Encounter for screening for infections with a predominantly sexual mode of transmission: Secondary | ICD-10-CM

## 2011-01-07 DIAGNOSIS — Z79899 Other long term (current) drug therapy: Secondary | ICD-10-CM

## 2011-01-07 LAB — LIPID PANEL
Cholesterol: 162 mg/dL (ref 0–200)
VLDL: 20 mg/dL (ref 0–40)

## 2011-01-08 LAB — CBC WITH DIFFERENTIAL/PLATELET
Basophils Absolute: 0 10*3/uL (ref 0.0–0.1)
HCT: 28.7 % — ABNORMAL LOW (ref 39.0–52.0)
Lymphocytes Relative: 25 % (ref 12–46)
MCV: 86.4 fL (ref 78.0–100.0)
Monocytes Absolute: 0.4 10*3/uL (ref 0.1–1.0)
Neutro Abs: 2.6 10*3/uL (ref 1.7–7.7)
Neutrophils Relative %: 61 % (ref 43–77)
Platelets: 77 10*3/uL — ABNORMAL LOW (ref 150–400)
RBC: 3.32 MIL/uL — ABNORMAL LOW (ref 4.22–5.81)
RDW: 24.1 % — ABNORMAL HIGH (ref 11.5–15.5)
WBC: 4.3 10*3/uL (ref 4.0–10.5)

## 2011-01-08 LAB — T-HELPER CELL (CD4) - (RCID CLINIC ONLY)
CD4 % Helper T Cell: 34 % (ref 33–55)
CD4 T Cell Abs: 320 uL — ABNORMAL LOW (ref 400–2700)

## 2011-01-08 LAB — HIV-1 RNA QUANT-NO REFLEX-BLD: HIV-1 RNA Quant, Log: <1.3 {Log} (ref <1.30)

## 2011-01-08 LAB — RPR

## 2011-01-22 ENCOUNTER — Ambulatory Visit (INDEPENDENT_AMBULATORY_CARE_PROVIDER_SITE_OTHER): Payer: Medicaid Other | Admitting: Internal Medicine

## 2011-01-22 ENCOUNTER — Encounter: Payer: Self-pay | Admitting: Internal Medicine

## 2011-01-22 VITALS — BP 127/80 | HR 74 | Temp 97.4°F | Ht 72.0 in | Wt 213.0 lb

## 2011-01-22 DIAGNOSIS — B20 Human immunodeficiency virus [HIV] disease: Secondary | ICD-10-CM

## 2011-01-22 MED ORDER — SPIRONOLACTONE 25 MG PO TABS: 25.0000 mg | ORAL_TABLET | Freq: Two times a day (BID) | ORAL | Status: DC

## 2011-01-22 MED ORDER — TRAZODONE HCL 50 MG PO TABS: 50.0000 mg | ORAL_TABLET | Freq: Every day | ORAL | Status: DC

## 2011-01-22 MED ORDER — EFAVIRENZ-EMTRICITAB-TENOFOVIR 600-200-300 MG PO TABS: 1.0000 | ORAL_TABLET | Freq: Every day | ORAL | Status: DC

## 2011-01-22 NOTE — Progress Notes (Signed)
  Subjective:    Patient ID: [name], male    DOB: 22-Nov-1965, 45 y.o.   MRN: [id number]  HPI [name] is here for routine follow up.  He continues on his ART and reports continued adherence.  He has been undetectable for some time, including for todays visit.  He also has underlying liver cirrhosis and requires lactulose for his ammonia but has been doing well.  He does report no alcohol for > 1 month.  He recently moved to his parents house and continues to have trouble sleeping.  Today though he has no other complaints.      Review of Systems  All other systems reviewed and are negative.       Objective:   Physical Exam  Constitutional: He is oriented to person, place, and time. He appears well-developed and well-nourished. No distress.  HENT:  Mouth/Throat: Oropharynx is clear and moist. No oropharyngeal exudate.  Eyes: No scleral icterus.  Neck: Normal range of motion.  Cardiovascular: Normal rate, regular rhythm and normal heart sounds.   No murmur heard. Pulmonary/Chest: Effort normal and breath sounds normal. No respiratory distress.  Abdominal:       Distended, soft, +bs  Lymphadenopathy:    He has no cervical adenopathy.  Neurological: He is alert and oriented to person, place, and time.  Skin: Skin is warm and dry. No erythema.  Psychiatric: He has a normal mood and affect. His behavior is normal.          Assessment & Plan:

## 2011-01-22 NOTE — Assessment & Plan Note (Signed)
From an HIV standpoint he continues to do well.  He takes his meds and is undetectable.  I encouraged him to continue to avoid alcohol.  I reminded him of condoms for any sexual activity.     I refilled his spironalactone and Atripla.  I encouraged him to continue taking the lactulose as directed by GI.

## 2011-02-01 ENCOUNTER — Telehealth: Payer: Self-pay

## 2011-02-01 NOTE — Telephone Encounter (Signed)
Message copied by Annett Fabian on Mon Feb 01, 2011  9:32 AM ------      Message from: Annett Fabian      Created: Fri Jan 01, 2011 11:17 AM       Needs labs

## 2011-02-01 NOTE — Telephone Encounter (Signed)
I have left a message for the patient to come for repeat lab work.

## 2011-02-10 ENCOUNTER — Telehealth: Payer: Self-pay

## 2011-02-10 NOTE — Telephone Encounter (Signed)
I have left another message for the patient that he needs to come for lab work prior to his appt on 02/18/11 with  Dr Leone Payor

## 2011-02-11 NOTE — Telephone Encounter (Signed)
I spoke with the patient and he will come for lab work one day this week or the beginning of next week

## 2011-02-12 ENCOUNTER — Other Ambulatory Visit (INDEPENDENT_AMBULATORY_CARE_PROVIDER_SITE_OTHER): Payer: Medicaid Other

## 2011-02-12 DIAGNOSIS — K7460 Unspecified cirrhosis of liver: Secondary | ICD-10-CM

## 2011-02-12 LAB — COMPREHENSIVE METABOLIC PANEL
AST: 71 U/L — ABNORMAL HIGH (ref 0–37)
Albumin: 3.3 g/dL — ABNORMAL LOW (ref 3.5–5.2)
BUN: 8 mg/dL (ref 6–23)
Calcium: 8.6 mg/dL (ref 8.4–10.5)
Chloride: 103 mEq/L (ref 96–112)
Glucose, Bld: 107 mg/dL — ABNORMAL HIGH (ref 70–99)
Potassium: 3.8 mEq/L (ref 3.5–5.1)
Total Bilirubin: 2.8 mg/dL — ABNORMAL HIGH (ref 0.3–1.2)

## 2011-02-16 NOTE — Progress Notes (Signed)
Quick Note:  Labs stable to ok Will discuss at REV 9/6 ______

## 2011-02-18 ENCOUNTER — Ambulatory Visit (INDEPENDENT_AMBULATORY_CARE_PROVIDER_SITE_OTHER): Payer: Medicaid Other | Admitting: Internal Medicine

## 2011-02-18 ENCOUNTER — Telehealth: Payer: Self-pay | Admitting: Licensed Clinical Social Worker

## 2011-02-18 ENCOUNTER — Encounter: Payer: Self-pay | Admitting: Internal Medicine

## 2011-02-18 DIAGNOSIS — R188 Other ascites: Secondary | ICD-10-CM

## 2011-02-18 DIAGNOSIS — K7290 Hepatic failure, unspecified without coma: Secondary | ICD-10-CM

## 2011-02-18 DIAGNOSIS — K7030 Alcoholic cirrhosis of liver without ascites: Secondary | ICD-10-CM

## 2011-02-18 DIAGNOSIS — K922 Gastrointestinal hemorrhage, unspecified: Secondary | ICD-10-CM

## 2011-02-18 DIAGNOSIS — G4700 Insomnia, unspecified: Secondary | ICD-10-CM

## 2011-02-18 MED ORDER — SPIRONOLACTONE 50 MG PO TABS: 50.0000 mg | ORAL_TABLET | Freq: Every day | ORAL | Status: DC

## 2011-02-18 MED ORDER — FUROSEMIDE 40 MG PO TABS: 40.0000 mg | ORAL_TABLET | Freq: Every day | ORAL | Status: DC

## 2011-02-18 NOTE — Telephone Encounter (Signed)
Patient called stating that he was not allowed to pick up rx for xanax because he had one filled for 2 mg by Dr. Nedra Hai on 02/03/2011. I explained to the patient that medicaid will not cover it. He wants [place] to call another rx in and he will pay for it out of pocket. Is that possible or does he have to wait for 30 days to go by.

## 2011-02-18 NOTE — Assessment & Plan Note (Addendum)
Ascites better, bilirubin decreased. Appears as well as he has been in some time. Reduce diuretics slightly by dropping 20 mg PM dose of furosemide and consolidate spironolactone into one dose of 50 mg, with 40 mg furosemide in AM. F/U about 3 months routine and do labs at that visit

## 2011-02-18 NOTE — Progress Notes (Signed)
  Subjective:    Patient ID: [name], male    DOB: [birth year], 45 y.o.   MRN: [id number]  HPI Follow-up cirrhosis, ascites and GERD, insomnia Exercising and swimming quite a bit. Feels better. Weight and fluid less. Wt Readings from Last 3 Encounters:  02/18/11 194 lb 12.8 oz (88.361 kg)  01/22/11 213 lb (96.616 kg)  12/21/10 202 lb (91.627 kg)  Sleeping is better but still having some insomnia. Less daytime sleepiness. Remains abstinent. No confusion.     Review of Systems As above    Objective:   Physical Exam General: NAD Eyes: anicteric Lungs: clear Heart: S1S2 no rubs, murmurs or gallops Abdomen: soft and nontender, BS+, no HSM mass or obvious ascites Ext: no edema Neuro: alert and oriented x 3, no asterixis          Assessment & Plan:

## 2011-02-18 NOTE — Assessment & Plan Note (Signed)
improved

## 2011-02-18 NOTE — Patient Instructions (Addendum)
Return to see Dr. Leone Payor in 3 months. Your prescription(s) has(have) been sent to your pharmacy for you to pick up.

## 2011-02-18 NOTE — Assessment & Plan Note (Signed)
Controlled on Nexium 40 mg - continue

## 2011-02-18 NOTE — Assessment & Plan Note (Signed)
Weight down Slight decrease in diuretics changing total furosemide to 40 mg from 60 mg Qd dosing for both that and spironolactone

## 2011-02-19 ENCOUNTER — Encounter: Payer: Self-pay | Admitting: Internal Medicine

## 2011-02-19 NOTE — Telephone Encounter (Signed)
Per Dr. Maurice March it's ok to fill once of 0.5 mg xanax. Called patient to ask what pharmacy he wants me to call it in to, and asked him to leave on the nurse voicemail.

## 2011-02-19 NOTE — Assessment & Plan Note (Signed)
Not evident on hx or exam today

## 2011-02-22 ENCOUNTER — Telehealth: Payer: Self-pay | Admitting: *Deleted

## 2011-02-22 ENCOUNTER — Other Ambulatory Visit: Payer: Self-pay | Admitting: *Deleted

## 2011-02-22 DIAGNOSIS — F419 Anxiety disorder, unspecified: Secondary | ICD-10-CM

## 2011-02-22 NOTE — Telephone Encounter (Signed)
Spoke with pharmacist at [facility] and Per Dr. Maurice March ok for early fill on patient's Xanax 0.5 mg.  Patient still has 2 refills left. Wendall Mola CMA

## 2011-02-22 NOTE — Telephone Encounter (Signed)
error 

## 2011-02-23 ENCOUNTER — Other Ambulatory Visit: Payer: Medicaid Other

## 2011-02-24 ENCOUNTER — Telehealth: Payer: Self-pay | Admitting: Internal Medicine

## 2011-02-24 ENCOUNTER — Other Ambulatory Visit: Payer: Self-pay | Admitting: *Deleted

## 2011-02-24 DIAGNOSIS — N62 Hypertrophy of breast: Secondary | ICD-10-CM

## 2011-02-24 NOTE — Telephone Encounter (Signed)
Patient advised.   He will call back with his weights next week.

## 2011-02-24 NOTE — Telephone Encounter (Signed)
Hold spironolactone - add gynecomastia to problem list please Weigh today and weigh again at least every 3 days - he needs to let us know what his weight is in 1 week

## 2011-02-24 NOTE — Telephone Encounter (Signed)
Patient calling to report he has some swelling in his breasts and they are very tender.  He says you have discussed with him in the past and told him it could be aside effect of some of his medications.  Please advise

## 2011-02-26 ENCOUNTER — Telehealth: Payer: Self-pay | Admitting: Internal Medicine

## 2011-03-01 NOTE — Telephone Encounter (Signed)
Left a message on voicemail for return call.

## 2011-03-02 NOTE — Telephone Encounter (Signed)
The patient called questioning that since his Spironolactone was on hold is he supposed to still  take the Lasix once a day or should he increase it to twice a day? He states that is weight is still the same he hasn't lose or gained since med was put on hold.

## 2011-03-03 NOTE — Telephone Encounter (Signed)
Lasix - furosemide was to stay the same How are his breasts? Still swollen and tender? Ask him for a number of pounds re: weight  I will follow-up after that info available

## 2011-03-03 NOTE — Telephone Encounter (Signed)
The patient stated that hi breast are still tender and sore. His weight is at 209 lbs.

## 2011-03-04 NOTE — Telephone Encounter (Signed)
Patient informed of Dr. Gessner's orders. 

## 2011-03-04 NOTE — Telephone Encounter (Signed)
1) Stay off spironolactone - he should call us back with weight and an update on breast tenderness next Wed AM - either the breasts are enlarged and tender from the liver disease itself or the spironolactone - waiting to see

## 2011-03-11 LAB — T-HELPER CELL (CD4) - (RCID CLINIC ONLY)
CD4 % Helper T Cell: 29 — ABNORMAL LOW
CD4 T Cell Abs: 270 — ABNORMAL LOW

## 2011-04-12 ENCOUNTER — Telehealth: Payer: Self-pay | Admitting: *Deleted

## 2011-04-12 ENCOUNTER — Other Ambulatory Visit: Payer: Self-pay | Admitting: Internal Medicine

## 2011-04-12 NOTE — Telephone Encounter (Signed)
Patient states that Dr Leone Payor has given him flexeril for a back ache in the past.  I can't find on his med list that you have ever prescribed this.  He reports he lifted something wrong and needs flexeril for the back spasms.  See other phone notes from today to ID.  Dr Leone Payor please advise

## 2011-04-12 NOTE — Telephone Encounter (Signed)
This problem, lower back pain, is not listed on the pt's problem list.  Pt is requesting "Flexeril" be called in to the pharmacy.  Pt states he is going to the beach soon.  RN advised that pt be seen at an Urgent Care Center to evaluate this type of problem.  Pt verbalized understanding.

## 2011-04-12 NOTE — Telephone Encounter (Signed)
I suggest he see PCP or urgent care for his back pain and they can Rx treatment

## 2011-04-12 NOTE — Telephone Encounter (Signed)
I have left a message for the patient with Dr Gessner's recommendations 

## 2011-04-28 ENCOUNTER — Other Ambulatory Visit: Payer: Self-pay | Admitting: *Deleted

## 2011-04-28 DIAGNOSIS — K922 Gastrointestinal hemorrhage, unspecified: Secondary | ICD-10-CM

## 2011-04-28 MED ORDER — ESOMEPRAZOLE MAGNESIUM 40 MG PO CPDR: 40.0000 mg | DELAYED_RELEASE_CAPSULE | Freq: Every day | ORAL | Status: DC

## 2011-04-29 ENCOUNTER — Encounter: Payer: Self-pay | Admitting: Internal Medicine

## 2011-04-29 ENCOUNTER — Other Ambulatory Visit: Payer: Self-pay | Admitting: Infectious Diseases

## 2011-04-29 ENCOUNTER — Other Ambulatory Visit: Payer: Medicaid Other

## 2011-04-29 DIAGNOSIS — B20 Human immunodeficiency virus [HIV] disease: Secondary | ICD-10-CM

## 2011-04-29 LAB — CBC WITH DIFFERENTIAL/PLATELET
HCT: 28.5 % — ABNORMAL LOW (ref 39.0–52.0)
Hemoglobin: 9 g/dL — ABNORMAL LOW (ref 13.0–17.0)
Lymphocytes Relative: 20 % (ref 12–46)
Lymphs Abs: 0.9 10*3/uL (ref 0.7–4.0)
Monocytes Absolute: 0.5 10*3/uL (ref 0.1–1.0)
Monocytes Relative: 10 % (ref 3–12)
Neutro Abs: 3 10*3/uL (ref 1.7–7.7)
Neutrophils Relative %: 67 % (ref 43–77)
RBC: 3.43 MIL/uL — ABNORMAL LOW (ref 4.22–5.81)
RDW: 18.8 % — ABNORMAL HIGH (ref 11.5–15.5)
WBC: 4.5 10*3/uL (ref 4.0–10.5)

## 2011-04-30 LAB — COMPLETE METABOLIC PANEL WITH GFR
ALT: 31 U/L (ref 0–53)
AST: 54 U/L — ABNORMAL HIGH (ref 0–37)
Albumin: 3.5 g/dL (ref 3.5–5.2)
CO2: 19 mEq/L (ref 19–32)
Calcium: 8.8 mg/dL (ref 8.4–10.5)
Chloride: 108 mEq/L (ref 96–112)
GFR, Est African American: >89 mL/min/{1.73_m2}
Potassium: 3.8 mEq/L (ref 3.5–5.3)
Sodium: 141 mEq/L (ref 135–145)
Total Protein: 6.4 g/dL (ref 6.0–8.3)

## 2011-04-30 LAB — T-HELPER CELL (CD4) - (RCID CLINIC ONLY): CD4 % Helper T Cell: 30 % — ABNORMAL LOW (ref 33–55)

## 2011-05-03 ENCOUNTER — Encounter (HOSPITAL_COMMUNITY): Payer: Self-pay | Admitting: *Deleted

## 2011-05-03 ENCOUNTER — Emergency Department (HOSPITAL_COMMUNITY): Payer: Medicaid Other

## 2011-05-03 ENCOUNTER — Ambulatory Visit (INDEPENDENT_AMBULATORY_CARE_PROVIDER_SITE_OTHER): Payer: Medicaid Other | Admitting: Internal Medicine

## 2011-05-03 ENCOUNTER — Encounter: Payer: Self-pay | Admitting: Internal Medicine

## 2011-05-03 ENCOUNTER — Inpatient Hospital Stay (HOSPITAL_COMMUNITY)
Admission: EM | Admit: 2011-05-03 | Discharge: 2011-05-08 | DRG: 371 | Disposition: A | Discharge status: Home / Self Care | Payer: Medicaid Other | Attending: Internal Medicine | Admitting: Internal Medicine

## 2011-05-03 ENCOUNTER — Telehealth: Payer: Self-pay | Admitting: Internal Medicine

## 2011-05-03 DIAGNOSIS — F329 Major depressive disorder, single episode, unspecified: Secondary | ICD-10-CM | POA: Diagnosis present

## 2011-05-03 DIAGNOSIS — I10 Essential (primary) hypertension: Secondary | ICD-10-CM

## 2011-05-03 DIAGNOSIS — M791 Myalgia, unspecified site: Secondary | ICD-10-CM

## 2011-05-03 DIAGNOSIS — K652 Spontaneous bacterial peritonitis: Secondary | ICD-10-CM

## 2011-05-03 DIAGNOSIS — IMO0001 Reserved for inherently not codable concepts without codable children: Secondary | ICD-10-CM

## 2011-05-03 DIAGNOSIS — E876 Hypokalemia: Secondary | ICD-10-CM | POA: Diagnosis present

## 2011-05-03 DIAGNOSIS — K7460 Unspecified cirrhosis of liver: Secondary | ICD-10-CM

## 2011-05-03 DIAGNOSIS — E785 Hyperlipidemia, unspecified: Secondary | ICD-10-CM | POA: Diagnosis present

## 2011-05-03 DIAGNOSIS — B20 Human immunodeficiency virus [HIV] disease: Secondary | ICD-10-CM | POA: Diagnosis present

## 2011-05-03 DIAGNOSIS — R519 Headache, unspecified: Secondary | ICD-10-CM | POA: Diagnosis present

## 2011-05-03 DIAGNOSIS — R188 Other ascites: Secondary | ICD-10-CM

## 2011-05-03 DIAGNOSIS — D6959 Other secondary thrombocytopenia: Secondary | ICD-10-CM | POA: Diagnosis present

## 2011-05-03 DIAGNOSIS — R509 Fever, unspecified: Secondary | ICD-10-CM

## 2011-05-03 DIAGNOSIS — K922 Gastrointestinal hemorrhage, unspecified: Secondary | ICD-10-CM

## 2011-05-03 DIAGNOSIS — G9009 Other idiopathic peripheral autonomic neuropathy: Secondary | ICD-10-CM | POA: Diagnosis present

## 2011-05-03 DIAGNOSIS — K7030 Alcoholic cirrhosis of liver without ascites: Secondary | ICD-10-CM | POA: Diagnosis present

## 2011-05-03 DIAGNOSIS — K7290 Hepatic failure, unspecified without coma: Secondary | ICD-10-CM

## 2011-05-03 DIAGNOSIS — K7682 Hepatic encephalopathy: Secondary | ICD-10-CM

## 2011-05-03 DIAGNOSIS — F419 Anxiety disorder, unspecified: Secondary | ICD-10-CM

## 2011-05-03 DIAGNOSIS — F3289 Other specified depressive episodes: Secondary | ICD-10-CM | POA: Diagnosis present

## 2011-05-03 DIAGNOSIS — R51 Headache: Secondary | ICD-10-CM | POA: Diagnosis present

## 2011-05-03 DIAGNOSIS — D649 Anemia, unspecified: Secondary | ICD-10-CM | POA: Diagnosis present

## 2011-05-03 DIAGNOSIS — F17200 Nicotine dependence, unspecified, uncomplicated: Secondary | ICD-10-CM | POA: Diagnosis present

## 2011-05-03 DIAGNOSIS — F411 Generalized anxiety disorder: Secondary | ICD-10-CM | POA: Diagnosis present

## 2011-05-03 DIAGNOSIS — F1020 Alcohol dependence, uncomplicated: Secondary | ICD-10-CM | POA: Diagnosis present

## 2011-05-03 DIAGNOSIS — E871 Hypo-osmolality and hyponatremia: Secondary | ICD-10-CM | POA: Diagnosis present

## 2011-05-03 HISTORY — DX: Spontaneous bacterial peritonitis: K65.2

## 2011-05-03 LAB — DIFFERENTIAL
Basophils Absolute: 0 10*3/uL (ref 0.0–0.1)
Basophils Relative: 0 % (ref 0–1)
Eosinophils Absolute: 0 10*3/uL (ref 0.0–0.7)
Eosinophils Relative: 0 % (ref 0–5)
Lymphocytes Relative: 11 % — ABNORMAL LOW (ref 12–46)
Lymphs Abs: 1.1 10*3/uL (ref 0.7–4.0)
Monocytes Absolute: 1.5 10*3/uL — ABNORMAL HIGH (ref 0.1–1.0)
Monocytes Relative: 14 % — ABNORMAL HIGH (ref 3–12)
Neutro Abs: 7.8 10*3/uL — ABNORMAL HIGH (ref 1.7–7.7)
Neutrophils Relative %: 75 % (ref 43–77)

## 2011-05-03 LAB — COMPREHENSIVE METABOLIC PANEL
ALT: 27 U/L (ref 0–53)
AST: 46 U/L — ABNORMAL HIGH (ref 0–37)
Albumin: 2.7 g/dL — ABNORMAL LOW (ref 3.5–5.2)
Alkaline Phosphatase: 114 U/L (ref 39–117)
BUN: 15 mg/dL (ref 6–23)
CO2: 18 mEq/L — ABNORMAL LOW (ref 19–32)
Calcium: 8 mg/dL — ABNORMAL LOW (ref 8.4–10.5)
Chloride: 95 mEq/L — ABNORMAL LOW (ref 96–112)
Creatinine, Ser: 0.95 mg/dL (ref 0.50–1.35)
GFR calc Af Amer: >90 mL/min (ref >90)
GFR calc non Af Amer: >90 mL/min (ref >90)
Glucose, Bld: 118 mg/dL — ABNORMAL HIGH (ref 70–99)
Potassium: 3.2 mEq/L — ABNORMAL LOW (ref 3.5–5.1)
Sodium: 124 mEq/L — ABNORMAL LOW (ref 135–145)
Total Bilirubin: 2.6 mg/dL — ABNORMAL HIGH (ref 0.3–1.2)
Total Protein: 6.2 g/dL (ref 6.0–8.3)

## 2011-05-03 LAB — URINALYSIS, ROUTINE W REFLEX MICROSCOPIC
Bilirubin Urine: NEGATIVE
Glucose, UA: NEGATIVE mg/dL
Hgb urine dipstick: NEGATIVE
Ketones, ur: NEGATIVE mg/dL
Leukocytes, UA: NEGATIVE
Nitrite: NEGATIVE
Protein, ur: NEGATIVE mg/dL
Specific Gravity, Urine: 1.017 (ref 1.005–1.030)
Urobilinogen, UA: 1 mg/dL (ref 0.0–1.0)
pH: 6 (ref 5.0–8.0)

## 2011-05-03 LAB — CBC
HCT: 23.9 % — ABNORMAL LOW (ref 39.0–52.0)
Hemoglobin: 8 g/dL — ABNORMAL LOW (ref 13.0–17.0)
MCH: 26.8 pg (ref 26.0–34.0)
MCHC: 33.5 g/dL (ref 30.0–36.0)
MCV: 80.2 fL (ref 78.0–100.0)
Platelets: 51 10*3/uL — ABNORMAL LOW (ref 150–400)
RBC: 2.98 MIL/uL — ABNORMAL LOW (ref 4.22–5.81)
RDW: 18.4 % — ABNORMAL HIGH (ref 11.5–15.5)
WBC: 10.4 10*3/uL (ref 4.0–10.5)

## 2011-05-03 LAB — HIV-1 RNA QUANT-NO REFLEX-BLD
HIV 1 RNA Quant: <20 copies/mL (ref <20)
HIV-1 RNA Quant, Log: <1.3 {Log} (ref <1.30)

## 2011-05-03 LAB — PROTIME-INR: Prothrombin Time: 21.3 seconds — ABNORMAL HIGH (ref 11.6–15.2)

## 2011-05-03 LAB — PROCALCITONIN: Procalcitonin: 2.82 ng/mL

## 2011-05-03 LAB — LACTIC ACID, PLASMA: Lactic Acid, Venous: 2.8 mmol/L — ABNORMAL HIGH (ref 0.5–2.2)

## 2011-05-03 LAB — LIPASE, BLOOD: Lipase: 21 U/L (ref 11–59)

## 2011-05-03 MED ORDER — LIDOCAINE HCL 1 % IJ SOLN
INTRAMUSCULAR | Status: AC
  Filled 2011-05-03: qty 20

## 2011-05-03 MED ORDER — IBUPROFEN 800 MG PO TABS
800.0000 mg | ORAL_TABLET | Freq: Once | ORAL | Status: AC
  Administered 2011-05-03: 800 mg via ORAL
  Filled 2011-05-03: qty 1

## 2011-05-03 MED ORDER — DEXTROSE 5 % IV SOLN
1.0000 g | Freq: Once | INTRAVENOUS | Status: AC
  Administered 2011-05-03: 1 g via INTRAVENOUS
  Filled 2011-05-03: qty 10

## 2011-05-03 MED ORDER — LIDOCAINE HCL (PF) 1 % IJ SOLN
5.0000 mL | Freq: Once | INTRAMUSCULAR | Status: DC
  Filled 2011-05-03: qty 5

## 2011-05-03 MED ORDER — SODIUM CHLORIDE 0.9 % IV BOLUS (SEPSIS)
1000.0000 mL | Freq: Once | INTRAVENOUS | Status: AC
  Administered 2011-05-03: 1000 mL via INTRAVENOUS

## 2011-05-03 NOTE — Telephone Encounter (Signed)
Addended by: Donata Duff on: 05/03/2011 03:03 PM   Modules accepted: Orders

## 2011-05-03 NOTE — Assessment & Plan Note (Signed)
It looks like this is increasing, without lower extremity edema. Await hospital workup. His creatinine was normal 4 days ago.

## 2011-05-03 NOTE — Assessment & Plan Note (Signed)
This underlying illness also increases his risk of serious problems with infection.

## 2011-05-03 NOTE — Assessment & Plan Note (Signed)
He is at increased risk of serious illness due to this underlying condition.

## 2011-05-03 NOTE — ED Notes (Signed)
Dr. Alto Denver unable to find an area to tap, therefore lidocaine ordered was canceled per Dr. Alto Denver.

## 2011-05-03 NOTE — Telephone Encounter (Signed)
Pt has been scheduled for paracentesis 05/04/11 11 arrive 1045 am

## 2011-05-03 NOTE — ED Notes (Signed)
Patient is resting comfortably. NAD. VSS. Call light in reach. Pt awaits admission.

## 2011-05-03 NOTE — Telephone Encounter (Signed)
I will see him this PM Please see what the availability for US guided paracentesis is in next few days also

## 2011-05-03 NOTE — Patient Instructions (Addendum)
Dr. Leone Payor has spoken to the Hospitalist, Dr. Laural Benes at [facility] and you will be admitted  but you need to check into the Emergency Room first for evaluation at that time you will be admitted. Dr. Leone Payor will come over and check on you tomorrow.

## 2011-05-03 NOTE — Progress Notes (Signed)
Subjective:    Patient ID: [name], male    DOB: 01-21-1966, 45 y.o.   MRN: [id number]  HPI [name] he developed myalgias, fevers, and generalized feeling poor about 4-5 days ago. He went to primary care and aspirin and was tested for the fluid was cold it was negative. Tami flu was recommended but he did not take that. He developed increasing abdominal girth though not edema. He feels weak. He has no focal symptoms at this time. There is no cough or runny nose. He has not been especially confused. He does have a lot of fatigue.  Outpatient Encounter Prescriptions as of 05/03/2011  Medication Sig Dispense Refill  . ALPRAZolam (XANAX) 0.5 MG tablet Take 1 tablet (0.5 mg total) by mouth every 8 (eight) hours.  30 tablet    . amitriptyline (ELAVIL) 25 MG tablet Take 50 mg by mouth at bedtime.        . B Complex-C-Folic Acid (FOLBEE PLUS) TABS Take 1 tablet by mouth daily.        Marland Kitchen efavirenz-emtrictabine-tenofovir (ATRIPLA) 600-200-300 MG per tablet Take 1 tablet by mouth at bedtime.  30 tablet  11  . esomeprazole (NEXIUM) 40 MG capsule Take 1 capsule (40 mg total) by mouth daily.  30 capsule  0  . furosemide (LASIX) 40 MG tablet Take 1 tablet (40 mg total) by mouth daily.  30 tablet  5  . GENERLAC 10 GM/15ML SOLN TAKE TWO TABLESPOONFULS(30ML) BY MOUTH 4 TIMES DAILY  1892 mL  2  . lactulose (CHRONULAC) 10 GM/15ML solution Take 30 mLs (20 g total) by mouth 4 (four) times daily.  4000 mL  5  . oseltamivir (TAMIFLU) 75 MG capsule Take 150 mg by mouth daily.        . potassium chloride (KLOR-CON) 20 MEQ packet Take 20 mEq by mouth daily.  30 tablet  4  . spironolactone (ALDACTONE) 50 MG tablet Take 1 tablet (50 mg total) by mouth daily.  30 tablet  5  . traZODone (DESYREL) 50 MG tablet Take 1 tablet (50 mg total) by mouth at bedtime.  30 tablet  5   Past Medical History  Diagnosis Date  . HIV DISEASE 06/23/2006  . HYPERLIPIDEMIA 06/23/2006  . ANXIETY 06/23/2006  . DEPRESSION 06/23/2006  .  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 HYPERTENSION 06/23/2006  . 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 Ascites 11/13/2009  . STRAIN, CHEST WALL 03/15/2007  . 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 Gastric ulcer 06/2010  . Alcoholic cirrhosis of liver   . Alcoholism, chronic    Past Surgical History  Procedure Date  . Esophagogastroduodenoscopy w/ banding 06/26/2010    variceal ligation  . Carpal tunnel release   . Esophagogastroduodenoscopy 07/01/2010;  08/12/10    small varices, gastric ulcer    reports that he has been smoking Cigarettes.  He has a 3 pack-year smoking history. He has never used smokeless tobacco. He reports that he does not drink alcohol or use illicit drugs. family history includes Alcohol abuse in his other; Breast cancer in his maternal aunt and sister; Drug abuse in his brother; Hyperlipidemia in his mother; and Hypertension in his father and mother.  There is no history of Colon cancer. Allergies  Allergen Reactions  . Penicillins     REACTION: hives        Review of Systems As above    Objective:   Physical Exam  Chronically ill in no acute distress eyes appear anicteric Pharynx is clear Neck is supple without masses thyromegaly no obvious lymphadenopathy Lungs are clear Heart S1-S2 I hear no erosions or gallops The abdomen is protuberant and distended what appears to be ascites, there is a small easily reducible umbilical hernia. The abdomen is nontender. The lower extremities are free of edema He is awake, alert, oriented x3. He does not have asterixis.      Assessment & Plan:  This is a complicated situation with fever and viral syndrome-like situation in a man with alcoholic cirrhosis, ascites and HIV disease. I am concerned that given his comorbidities, he will not do well as an outpatient. I have discussed the situation with the hospitalist who has  agreed to admit the patient and we can see him in consultation. Due to the lack of bed availability at this time, the hospital is recommended I sent the patient to the emergency room for a more urgent evaluation given his problems. I think at least he is going to need a paracentesis with studies looking for infection but I think there may be something else given the constitutional symptoms along with a fever.

## 2011-05-03 NOTE — ED Notes (Signed)
Pt says he has been having pain , weight gain (217-227), fevers since Friday. Started on Tamiflu on Friday. No meds taken prior to arrival.

## 2011-05-03 NOTE — ED Provider Notes (Signed)
History     CSN: [id number] Arrival date & time: 05/03/2011  6:18 PM   First MD Initiated Contact with Patient 05/03/11 1924      Chief Complaint  Patient presents with  . Fever    generalized edema    (Consider location/radiation/quality/duration/timing/severity/associated sxs/prior treatment) Patient is a 45 y.o. male presenting with fever. The history is provided by the patient.  Fever Primary symptoms of the febrile illness include fever. Primary symptoms do not include fatigue, visual change, headaches, cough, wheezing, shortness of breath, abdominal pain, nausea, vomiting, diarrhea, dysuria, altered mental status, myalgias or rash.  Risk factors for febrile illness include HIV.  patient states he was at the, vs. last Thursday, and was seen by his primary care doctor on Friday given Tamiflu in case it was influenza.  Past Medical History  Diagnosis Date  . HIV DISEASE 06/23/2006  . HYPERLIPIDEMIA 06/23/2006  . ANXIETY 06/23/2006  . DEPRESSION 06/23/2006  . 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 HYPERTENSION 06/23/2006  . 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 Ascites 11/13/2009  . STRAIN, CHEST WALL 03/15/2007  . 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 Gastric ulcer 06/2010  . Alcoholic cirrhosis of liver   . Alcoholism, chronic     Past Surgical History  Procedure Date  . Esophagogastroduodenoscopy w/ banding 06/26/2010    variceal ligation  . Carpal tunnel release   . Esophagogastroduodenoscopy 07/01/2010;  08/12/10    small varices, gastric ulcer    Family History  Problem Relation Age of Onset  . Hyperlipidemia Mother   . Hypertension Mother   . Hypertension Father   . Drug abuse Brother   . Breast cancer Maternal Aunt   . Colon cancer Neg Hx   . Alcohol abuse Other   . Breast cancer Sister     History  Substance Use Topics  . Smoking status: Current Everyday  Smoker -- 0.3 packs/day for 10 years    Types: Cigarettes  . Smokeless tobacco: Never Used  . Alcohol Use: No     pt is an alcoholic currently in remission-AA meetings      Review of Systems  Constitutional: Positive for fever. Negative for fatigue.  Respiratory: Negative for cough, shortness of breath and wheezing.   Gastrointestinal: Negative for nausea, vomiting, abdominal pain and diarrhea.  Genitourinary: Negative for dysuria.  Musculoskeletal: Negative for myalgias.  Skin: Negative for rash.  Neurological: Negative for headaches.  Psychiatric/Behavioral: Negative for altered mental status.    Allergies  Penicillins  Home Medications   Current Outpatient Rx  Name Route Sig Dispense Refill  . ALPRAZOLAM 0.5 MG PO TABS Oral Take 0.5 mg by mouth every 8 (eight) hours as needed. For anxiety.     . AMITRIPTYLINE HCL 25 MG PO TABS Oral Take 50 mg by mouth at bedtime.      . EFAVIRENZ-EMTRICITAB-TENOFOVIR 600-200-300 MG PO TABS Oral Take 1 tablet by mouth at bedtime. 30 tablet 11  . ESOMEPRAZOLE MAGNESIUM 40 MG PO CPDR Oral Take 1 capsule (40 mg total) by mouth daily. 30 capsule 0    Need office visit for further refills!  . FUROSEMIDE 40 MG PO TABS Oral Take 1 tablet (40 mg total) by mouth daily. 30 tablet 5  . GENERLAC 10 GM/15ML PO SOLN  TAKE TWO TABLESPOONFULS(30ML) BY MOUTH 4 TIMES DAILY 1892 mL 2  . OSELTAMIVIR PHOSPHATE 75 MG PO CAPS Oral Take 150 mg by mouth daily.      Marland Kitchen  SPIRONOLACTONE 50 MG PO TABS Oral Take 50 mg by mouth 2 (two) times daily.        BP 128/70  Pulse 102  Temp(Src) 102.3 F (39.1 C) (Oral)  Resp 22  Ht 5\' 9"  (1.753 m)  Wt 227 lb (102.967 kg)  BMI 33.52 kg/m2  SpO2 98%  Physical Exam  Nursing note and vitals reviewed. Constitutional: He is oriented to person, place, and time. He appears well-developed and well-nourished. No distress.  HENT:  Head: Normocephalic and atraumatic.  Eyes: Pupils are equal, round, and reactive to light.  Neck:  Normal range of motion. Neck supple.  Cardiovascular: Regular rhythm.  Tachycardia present.   Pulmonary/Chest: Effort normal and breath sounds normal. No respiratory distress. He has no wheezes. He has no rales. He exhibits no tenderness.  Abdominal: Soft. He exhibits distension. There is no tenderness. There is no rebound and no guarding.  Neurological: He is alert and oriented to person, place, and time. He exhibits normal muscle tone. Coordination normal.  Skin: Skin is warm and dry. No rash noted. No erythema. No pallor.    ED Course  Procedures (including critical care time)   Labs Reviewed  CBC  DIFFERENTIAL  URINALYSIS, ROUTINE W REFLEX MICROSCOPIC  COMPREHENSIVE METABOLIC PANEL  LIPASE, BLOOD  AMMONIA  PROTIME-INR     Given Dr.Jerelene Salaam  a complete history about the patient.  She will followup on all the patient's testing.     MDM  Patient was evaluated by myself. Patient's most recent CD4 count had been to 50. He did have a CAT scan of his head given his report of headache this was negative. Patient also had a chest x-ray which was unremarkable. Urinalysis did not demonstrate urinary tract infection. Patient did have elevated indirect bilirubin. He had a relative leukocytosis with a white count of 10 which is elevated from normal values of 4. Patient had thrombocytopenia and elevated INR consistent with prior values. Patient's fever did improve. He also had a hyponatremia that was noted on his renal panel the renal function was preserved. Ultrasound of the patient's abdomen was performed by myself. Given that there was not a definitive pocket of fluid for paracentesis this procedure was now performed. I spoke to Dr. Zenaida Niece dam who was covering for infectious disease. He agreed that patient's likely source for infection may be a spontaneous bacterial peritonitis. We agreed that Rocephin was an appropriate antibiotic choice. Patient was given Rocephin 1 mg IV. Patient was accepted for  admission to the hospitalist. His vital signs continued to improve while he was treated here in the emergency department.  Medical screening examination/treatment/procedure(s) were conducted as a shared visit with non-physician practitioner(s) and myself.  I personally evaluated the patient during the encounter     Carlyle Dolly, [place] 05/03/11 2016  Cyndra Numbers, MD 05/04/11 [phone]

## 2011-05-03 NOTE — Assessment & Plan Note (Signed)
He is clear today. No asterixis.

## 2011-05-03 NOTE — ED Notes (Signed)
Receiving RN on 3E unable to receive report at this time.

## 2011-05-03 NOTE — ED Notes (Signed)
Pt with c/o generalized pain localized in legs, abd, and head. Pt states he has had a fever since last Friday, was seen by PCP and prescribed Tamiflu without relief. Pt states he has gained 10 lbs since Friday. Pt febrile in triage. Denies n/v/d.

## 2011-05-03 NOTE — Telephone Encounter (Signed)
Patient reports that he is having abdominal pain, he is not able to wear any of his pants.  He reports approximately 15 lb weight gain since last Friday.  He does not do daily weights, so this is not accurate.  He is taking lasix 40 mg and he reports that he took an extra 40 last night.  He states he is very uncomfortable and can't eat.  Dr Leone Payor please advise, do you want to see him this pm?

## 2011-05-04 ENCOUNTER — Inpatient Hospital Stay (HOSPITAL_COMMUNITY)
Admission: RE | Admit: 2011-05-04 | Discharge: 2011-05-04 | Disposition: A | Discharge status: Home / Self Care | Payer: Medicaid Other | Source: Ambulatory Visit | Attending: Internal Medicine | Admitting: Internal Medicine

## 2011-05-04 DIAGNOSIS — R509 Fever, unspecified: Secondary | ICD-10-CM

## 2011-05-04 DIAGNOSIS — R519 Headache, unspecified: Secondary | ICD-10-CM | POA: Diagnosis present

## 2011-05-04 DIAGNOSIS — R188 Other ascites: Secondary | ICD-10-CM

## 2011-05-04 DIAGNOSIS — R51 Headache: Secondary | ICD-10-CM | POA: Diagnosis present

## 2011-05-04 DIAGNOSIS — E876 Hypokalemia: Secondary | ICD-10-CM

## 2011-05-04 DIAGNOSIS — K7460 Unspecified cirrhosis of liver: Secondary | ICD-10-CM

## 2011-05-04 DIAGNOSIS — B20 Human immunodeficiency virus [HIV] disease: Secondary | ICD-10-CM

## 2011-05-04 HISTORY — DX: Hypokalemia: E87.6

## 2011-05-04 LAB — BASIC METABOLIC PANEL
BUN: 19 mg/dL (ref 6–23)
CO2: 19 mEq/L (ref 19–32)
Chloride: 98 mEq/L (ref 96–112)
Creatinine, Ser: 1.02 mg/dL (ref 0.50–1.35)
GFR calc non Af Amer: 86 mL/min — ABNORMAL LOW (ref >90)
Glucose, Bld: 112 mg/dL — ABNORMAL HIGH (ref 70–99)
Potassium: 3.1 mEq/L — ABNORMAL LOW (ref 3.5–5.1)
Sodium: 126 mEq/L — ABNORMAL LOW (ref 135–145)

## 2011-05-04 LAB — PROTIME-INR: INR: 2.09 — ABNORMAL HIGH (ref 0.00–1.49)

## 2011-05-04 LAB — CBC
HCT: 23.7 % — ABNORMAL LOW (ref 39.0–52.0)
Hemoglobin: 7.9 g/dL — ABNORMAL LOW (ref 13.0–17.0)
MCH: 26.6 pg (ref 26.0–34.0)
MCHC: 33.3 g/dL (ref 30.0–36.0)
MCV: 79.8 fL (ref 78.0–100.0)
RBC: 2.97 MIL/uL — ABNORMAL LOW (ref 4.22–5.81)
RDW: 18.5 % — ABNORMAL HIGH (ref 11.5–15.5)
WBC: 7.3 10*3/uL (ref 4.0–10.5)

## 2011-05-04 LAB — AMMONIA: Ammonia: 19 umol/L (ref 11–60)

## 2011-05-04 LAB — IRON AND TIBC
Iron: 18 ug/dL — ABNORMAL LOW (ref 42–135)
Saturation Ratios: 7 % — ABNORMAL LOW (ref 20–55)
UIBC: 255 ug/dL (ref 125–400)

## 2011-05-04 LAB — MAGNESIUM: Magnesium: 1.9 mg/dL (ref 1.5–2.5)

## 2011-05-04 LAB — CRYPTOCOCCAL ANTIGEN: Crypto Ag: NEGATIVE

## 2011-05-04 LAB — RETICULOCYTES
RBC.: 2.97 MIL/uL — ABNORMAL LOW (ref 4.22–5.81)
Retic Count, Absolute: 77.2 10*3/uL (ref 19.0–186.0)

## 2011-05-04 LAB — FOLATE: Folate: 12.8 ng/mL

## 2011-05-04 LAB — FERRITIN: Ferritin: 42 ng/mL (ref 22–322)

## 2011-05-04 LAB — T-HELPER CELLS (CD4) COUNT (NOT AT ARMC): CD4 % Helper T Cell: 23 % — ABNORMAL LOW (ref 33–55)

## 2011-05-04 MED ORDER — AMITRIPTYLINE HCL 50 MG PO TABS
50.0000 mg | ORAL_TABLET | Freq: Every day | ORAL | Status: DC
  Administered 2011-05-04 – 2011-05-07 (×3): 50 mg via ORAL
  Filled 2011-05-04 (×6): qty 1

## 2011-05-04 MED ORDER — INFLUENZA VIRUS VACC SPLIT PF IM SUSP
0.5000 mL | INTRAMUSCULAR | Status: AC
  Administered 2011-05-05: 0.5 mL via INTRAMUSCULAR
  Filled 2011-05-04: qty 0.5

## 2011-05-04 MED ORDER — ALPRAZOLAM 0.5 MG PO TABS
0.5000 mg | ORAL_TABLET | Freq: Three times a day (TID) | ORAL | Status: DC | PRN
  Administered 2011-05-04 – 2011-05-08 (×5): 0.5 mg via ORAL
  Filled 2011-05-04 (×5): qty 1

## 2011-05-04 MED ORDER — ACETAMINOPHEN 325 MG PO TABS
650.0000 mg | ORAL_TABLET | Freq: Four times a day (QID) | ORAL | Status: DC | PRN
  Administered 2011-05-04: 650 mg via ORAL

## 2011-05-04 MED ORDER — FUROSEMIDE 40 MG PO TABS
40.0000 mg | ORAL_TABLET | Freq: Every day | ORAL | Status: DC
  Administered 2011-05-04 – 2011-05-05 (×2): 40 mg via ORAL
  Filled 2011-05-04 (×2): qty 1

## 2011-05-04 MED ORDER — POTASSIUM CHLORIDE CRYS ER 20 MEQ PO TBCR
40.0000 meq | EXTENDED_RELEASE_TABLET | Freq: Once | ORAL | Status: AC
  Administered 2011-05-04: 40 meq via ORAL
  Filled 2011-05-04: qty 2

## 2011-05-04 MED ORDER — DEXTROSE 5 % IV SOLN: 1.0000 g | INTRAVENOUS | Status: DC

## 2011-05-04 MED ORDER — LACTULOSE ENCEPHALOPATHY 10 GM/15ML PO SOLN
20.0000 g | Freq: Two times a day (BID) | ORAL | Status: DC
  Administered 2011-05-04 – 2011-05-05 (×4): 20 g via ORAL
  Filled 2011-05-04 (×9): qty 30

## 2011-05-04 MED ORDER — ZOLPIDEM TARTRATE 10 MG PO TABS
10.0000 mg | ORAL_TABLET | Freq: Once | ORAL | Status: AC
  Administered 2011-05-04: 10 mg via ORAL
  Filled 2011-05-04: qty 1

## 2011-05-04 MED ORDER — DEXTROSE 5 % IV SOLN
1.0000 g | INTRAVENOUS | Status: DC
  Administered 2011-05-04 – 2011-05-06 (×3): 1 g via INTRAVENOUS
  Filled 2011-05-04 (×4): qty 10

## 2011-05-04 MED ORDER — VANCOMYCIN HCL 1000 MG IV SOLR
1500.0000 mg | INTRAVENOUS | Status: AC
  Administered 2011-05-04: 1500 mg via INTRAVENOUS
  Filled 2011-05-04 (×2): qty 1500

## 2011-05-04 MED ORDER — EFAVIRENZ-EMTRICITAB-TENOFOVIR 600-200-300 MG PO TABS
1.0000 | ORAL_TABLET | Freq: Every day | ORAL | Status: DC
  Administered 2011-05-04 – 2011-05-07 (×4): 1 via ORAL
  Filled 2011-05-04 (×6): qty 1

## 2011-05-04 MED ORDER — VANCOMYCIN HCL IN DEXTROSE 1-5 GM/200ML-% IV SOLN
1000.0000 mg | Freq: Two times a day (BID) | INTRAVENOUS | Status: DC
  Filled 2011-05-04 (×2): qty 200

## 2011-05-04 MED ORDER — ACETAMINOPHEN 325 MG PO TABS
ORAL_TABLET | ORAL | Status: AC
  Administered 2011-05-04: 650 mg via ORAL
  Filled 2011-05-04: qty 2

## 2011-05-04 MED ORDER — PANTOPRAZOLE SODIUM 40 MG PO TBEC
40.0000 mg | DELAYED_RELEASE_TABLET | Freq: Every day | ORAL | Status: DC
  Administered 2011-05-04 – 2011-05-08 (×5): 40 mg via ORAL
  Filled 2011-05-04 (×6): qty 1

## 2011-05-04 MED ORDER — SODIUM CHLORIDE 0.9 % IJ SOLN
3.0000 mL | INTRAMUSCULAR | Status: DC | PRN
  Administered 2011-05-06: 3 mL via INTRAVENOUS

## 2011-05-04 MED ORDER — SPIRONOLACTONE 50 MG PO TABS
50.0000 mg | ORAL_TABLET | Freq: Every day | ORAL | Status: DC
  Administered 2011-05-04 – 2011-05-05 (×2): 50 mg via ORAL
  Filled 2011-05-04 (×2): qty 1

## 2011-05-04 NOTE — Consult Note (Signed)
Referring Provider: [facility]istPrimary Care Physician:  Simone Curia, MD Primary Gastroenterologist:  Catha Brow  Reason for Consultation: Cirrhosis,ascites,Fever, HIV-R/O SBP  HPI: [name] is a 45 y.o. male known to Dr. Leone Payor, who was seen in the office yesterday and sent to the emergency room for admission secondary to three-day history of high fevers myalgias and headache. Please see Dr. Marvell Fuller office note from yesterday. Patient has history of decompensated cirrhosis secondary to EtOH, complicated by encephalopathy coagulopathy, ascites, and history of esophageal varices. Patient relates he had been doing well over the past couple of months and and had onset on Saturday with backache headache and fever with shaking chills. His symptoms persisted he has had associated anorexia area He denies any cough shortness of breath or sputum production. No sore throat or congestion. No diarrhea melena or hematochezia. He says he gained about 10 pounds over 4-5 days despite taking his fluid pill, and says that his abdomen feels bloated but he has not had any abdominal pain. He apparently stopped taking his antiviras , when the fever started. He has had history of variable compliance with medications in the past. He feels a little bit better this morning, still complaining of significant headache. MAXIMUM TEMPERATURE 102.3 since admission. CT of the head last evening was negative, chest x-ray without definite pneumonia. He is to have paracentesis today to rule out SBP.   Past Medical History  Diagnosis Date  . HIV DISEASE 06/23/2006  . HYPERLIPIDEMIA 06/23/2006  . ANXIETY 06/23/2006  . DEPRESSION 06/23/2006  . 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 HYPERTENSION 06/23/2006  . 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 Ascites 11/13/2009  . STRAIN, CHEST WALL 03/15/2007  . Varices,  esophageal 06/2010  . Gastric ulcer 06/2010  . Alcoholic cirrhosis of liver   . Alcoholism, chronic     Past Surgical History  Procedure Date  . Esophagogastroduodenoscopy w/ banding 06/26/2010    variceal ligation  . Carpal tunnel release   . Esophagogastroduodenoscopy 07/01/2010;  08/12/10    small varices, gastric ulcer    Prior to Admission medications   Medication Sig Start Date End Date Taking? Authorizing Provider  ALPRAZolam Prudy Feeler) 0.5 MG tablet Take 0.5 mg by mouth every 8 (eight) hours as needed. For anxiety.  11/12/10  Yes [name], MD  amitriptyline (ELAVIL) 25 MG tablet Take 50 mg by mouth at bedtime.     Yes Historical Provider, MD  efavirenz-emtrictabine-tenofovir (ATRIPLA) 600-200-300 MG per tablet Take 1 tablet by mouth at bedtime. 01/22/11  Yes Staci Righter, MD  esomeprazole (NEXIUM) 40 MG capsule Take 1 capsule (40 mg total) by mouth daily. 04/28/11 04/27/12 Yes Iva Boop, MD  furosemide (LASIX) 40 MG tablet Take 1 tablet (40 mg total) by mouth daily. 02/18/11  Yes Iva Boop, MD  [facility] 10 GM/15ML SOLN TAKE TWO TABLESPOONFULS(30ML) BY MOUTH 4 TIMES DAILY 12/21/10  Yes John Arletta Bale  oseltamivir (TAMIFLU) 75 MG capsule Take 150 mg by mouth daily.     Yes Historical Provider, MD  spironolactone (ALDACTONE) 50 MG tablet Take 50 mg by mouth 2 (two) times daily.   02/18/11  Yes Iva Boop, MD    Current Facility-Administered Medications  Medication Dose Route Frequency Provider Last Rate Last Dose  . ALPRAZolam (XANAX) tablet 0.5 mg  0.5 mg Oral Q8H PRN Debby Crosley, MD      . amitriptyline (ELAVIL) tablet 50 mg  50  mg Oral QHS Debby Crosley, MD      . cefTRIAXone (ROCEPHIN) 1 g in dextrose 5 % 50 mL IVPB  1 g Intravenous Once Cyndra Numbers, MD   1 g at 05/03/11 2206  . cefTRIAXone (ROCEPHIN) 1 g in dextrose 5 % 50 mL IVPB  1 g Intravenous Q24H Julian Crowford Darlina Guys., PHARMD      . efavirenz-emtrictabine-tenofovir (ATRIPLA) 600-200-300 MG per tablet 1  tablet  1 tablet Oral QHS Debby Crosley, MD      . ibuprofen (ADVIL,MOTRIN) tablet 800 mg  800 mg Oral Once Carlyle Dolly, PA   800 mg at 05/03/11 2019  . lactulose (encephalopathy) (CHRONULAC) 10 GM/15ML solution 20 g  20 g Oral BID Debby Crosley, MD   20 g at 05/04/11 0320  . lidocaine (XYLOCAINE) 1 % (with pres) injection           . pantoprazole (PROTONIX) EC tablet 40 mg  40 mg Oral Daily Debby Crosley, MD      . potassium chloride SA (K-DUR,KLOR-CON) CR tablet 40 mEq  40 mEq Oral Once Gery Pray, MD   40 mEq at 05/04/11 0320  . sodium chloride 0.9 % bolus 1,000 mL  1,000 mL Intravenous Once Carlyle Dolly, PA   1,000 mL at 05/03/11 1947  . sodium chloride 0.9 % injection 3 mL  3 mL Intravenous PRN Debby Crosley, MD      . vancomycin (VANCOCIN) 1,500 mg in sodium chloride 0.9 % 500 mL IVPB  1,500 mg Intravenous To ER Jacquenette Shone Crowford Darlina Guys., PHARMD   1,500 mg at 05/04/11 0329  . vancomycin (VANCOCIN) IVPB 1000 mg/200 mL premix  1,000 mg Intravenous Q12H Julian Crowford Darlina Guys., PHARMD      . DISCONTD: cefTRIAXone (ROCEPHIN) 1 g in dextrose 5 % 50 mL IVPB  1 g Intravenous Q24H Debby Crosley, MD      . DISCONTD: cefTRIAXone (ROCEPHIN) 1 g in dextrose 5 % 50 mL IVPB  1 g Intravenous Q24H Debby Crosley, MD      . DISCONTD: lidocaine (XYLOCAINE) 1 % injection 5 mL  5 mL Intradermal Once Cyndra Numbers, MD        Allergies as of 05/03/2011 - Review Complete 05/03/2011  Allergen Reaction Noted  . Penicillins      Family History  Problem Relation Age of Onset  . Hyperlipidemia Mother   . Hypertension Mother   . Hypertension Father   . Drug abuse Brother   . Breast cancer Maternal Aunt   . Colon cancer Neg Hx   . Alcohol abuse Other   . Breast cancer Sister     History   Social History  . Marital Status: Single    Spouse Name: N/A    Number of Children: N/A  . Years of Education: N/A   Occupational History  . disability    Social History Main Topics  .  Smoking status: Current Everyday Smoker -- 0.3 packs/day for 10 years    Types: Cigarettes  . Smokeless tobacco: Never Used  . Alcohol Use: No     pt is an alcoholic currently in [employer]  . Drug Use: No  . Sexually Active: Yes    Birth Control/ Protection: Condom     pt. declined condoms   Other Topics Concern  . Not on file   Social History Narrative  . No narrative on file    Review of Systems: Gen: + fever, chills, sweats, anorexia, fatigue, weakness, malaise,  CV: Denies chest pain, angina, palpitations, syncope, orthopnea, PND, peripheral edema, and claudication. Resp: Denies dyspnea at rest, dyspnea with exercise, cough, sputum, wheezing, coughing up blood, and pleurisy. GI:as above  Denies dysphagia or odynophagia. GU : Denies urinary burning, blood in urine, urinary frequency, urinary hesitancy, nocturnal urination, and urinary incontinence. MS: Denies joint pain, limitation of movement, and swelling, positive for back pain Denies muscle weakness, cramps, atrophy.  Derm: Denies rash, itching, dry skin, hives, moles, warts, or unhealing ulcers.  Psych: Denies depression, anxiety, memory loss, suicidal ideation, hallucinations, paranoia, and confusion. Heme: Denies bruising, bleeding, and enlarged lymph nodes. Neuro: Positive for headaches x4 days  Physical Exam: Vital signs in last 24 hours: Temp:  [97.5 F (36.4 C)-102.3 F (39.1 C)] 97.5 F (36.4 C) (11/20 0534) Pulse Rate:  [70-102] 70  (11/20 0534) Resp:  [20-22] 20  (11/20 0534) BP: (96-136)/(57-80) 96/57 mmHg (11/20 0534) SpO2:  [98 %-100 %] 100 % (11/20 0534) Weight:  [102.5 kg (225 lb 15.5 oz)-103.239 kg (227 lb 9.6 oz)] 225 lb 15.5 oz (102.5 kg) (11/20 0139) Last BM Date: 05/04/11 General:   Alert,  Well-developed, well-nourished, somewhat diaphoretic pleasant and cooperative in NAD Head:  Normocephalic and atraumatic. Eyes:  Sclera clear, no icterus.   Conjunctiva pink. Ears:  Normal auditory  acuity. Nose:  No deformity, discharge,  or lesions. Mouth:  No deformity or lesions.  Oropharynx pink & moist. Neck:  Supple; no masses or thyromegaly. Lungs:  Clear throughout to auscultation.   No wheezes, crackles, or rhonchi. No acute distress. Heart:  Regular rate and rhythm; no murmurs, clicks, rubs,  or gallops. Abdomen:  Soft, nontender, nontender ascites no appreciable tenderness No masses, hepatosplenomegaly or hernias noted. Normal bowel sounds, without guarding, and without rebound.   Rectal: Not done Msk:  Symmetrical without gross deformities. Normal posture. Pulses:  Normal pulses noted. Extremities:  Without clubbing or edema. Neurologic:  Alert and  oriented x4;  grossly normal neurologically, no asterixis Skin:  Intact without significant lesions or rashes.  Psych:  Alert and cooperative. Normal mood and affect.  Intake/Output from previous day: 11/19 0701 - 11/20 0700 In: 220 [P.O.:220] Out: -  Intake/Output this shift:    Lab Results:  Basename 05/04/11 0335 05/03/11 1950  WBC 7.3 10.4  HGB 7.9* 8.0*  HCT 23.7* 23.9*  PLT 50* 51*   BMET  Basename 05/04/11 0335 05/03/11 1950  NA 126* 124*  K 3.1* 3.2*  CL 98 95*  CO2 19 18*  GLUCOSE 112* 118*  BUN 19 15  CREATININE 1.02 0.95  CALCIUM 7.7* 8.0*   LFT  Basename 05/03/11 1950  PROT 6.2  ALBUMIN 2.7*  AST 46*  ALT 27  ALKPHOS 114  BILITOT 2.6*  BILIDIR --  IBILI --   PT/INR  Basename 05/04/11 0335 05/03/11 1950  LABPROT 23.8* 21.3*  INR 2.09* 1.81*   Hepatitis Panel No results found for this basename: HEPBSAG,HCVAB,HEPAIGM,HEPBIGM in the last 72 hours    Studies/Results: Dg Chest 2 View  05/03/2011  *RADIOLOGY REPORT*  Clinical Data: Pain, weakness  CHEST - 2 VIEW  Comparison: None.  Findings: Mild cardiac enlargement and vascular congestion. Basilar atelectasis noted.  Negative for CHF, definite pneumonia, effusion or pneumothorax.  Trachea midline.  IMPRESSION: Cardiomegaly  without CHF.  Basilar atelectasis.  Original Report Authenticated By: Judie Petit. Ruel Favors, M.D.   Ct Head Wo Contrast  05/03/2011  *RADIOLOGY REPORT*  Clinical Data:  Fever, headaches  CT HEAD WITHOUT CONTRAST  Technique:  Contiguous axial images were obtained from the base of the skull through the vertex without contrast  Comparison:  05/09/2010  Findings:  The brain has a normal appearance without evidence for hemorrhage, acute infarction, hydrocephalus, or mass lesion.  There is no extra axial fluid collection.  The skull and paranasal sinuses are normal.  IMPRESSION: Normal CT of the head without contrast.  Original Report Authenticated By: Judie Petit. Ruel Favors, M.D.    Impression/Plan;  #90 45 year old male with decompensated Lannec cirrhosis, with history of hepatic encephalopathy, chronic coagulopathy, chronic thrombocytopenia, history of esophageal varices status post banding. #2 chronic ascites, patient stating 10 pound weight gain over the past week. We'll continue his Lasix 40 mg by mouth daily and Aldactone 50 mg daily. #3 HIV #4 chronic anemia #5 hyponatremia, improving- patient with increased free water intake over the past 4-5 days #6 hypokalemia, correcting  #7 acute febrile illness x4 days with headache , myalgias. Etiology unclear, viral rule out influenza versus possible bacterial.   Agree with paracentesis today to rule out SBP though this presentation would be unusual. Continue Rocephin, and vanco--will review cell counts Would ask ID to see and follow.      Sherell Christoffel  05/04/2011, 9:36 AM

## 2011-05-04 NOTE — Progress Notes (Signed)
Subjective: Patient feels slightly better today. His headache has improved is now 5/10 as opposed to 10 out of 10 when he came in. He does not have any abdominal pain. The abdominal distention has slightly improved as well. He feels a little better.  Objective: Vital signs in last 24 hours: Temp:  [97.5 F (36.4 C)-102.3 F (39.1 C)] 97.5 F (36.4 C) (11/20 0534) Pulse Rate:  [70-102] 70  (11/20 0534) Resp:  [20-22] 20  (11/20 0534) BP: (96-136)/(57-80) 96/57 mmHg (11/20 0534) SpO2:  [98 %-100 %] 100 % (11/20 0534) Weight:  [102.5 kg (225 lb 15.5 oz)-103.239 kg (227 lb 9.6 oz)] 225 lb 15.5 oz (102.5 kg) (11/20 0139) Weight change:   -Intake/Output from previous day: 11/19 0701 - 11/20 0700 In: 220 [P.O.:220] Out: -   Blood pressure 96/57, pulse 70, temperature 97.5 F (36.4 C), temperature source Oral, resp. rate 20, height 5\' 9"  (1.753 m), weight 102.5 kg (225 lb 15.5 oz), SpO2 100.00%.  Physical exam HEENT: normal Cardio: RRR Resp: CTA B/L GI: BS positive and Distention. Positive for ascites Extremity:  Pulses positive  Lab Results: Lab Results  Component Value Date   WBC 7.3 05/04/2011   HGB 7.9* 05/04/2011   HCT 23.7* 05/04/2011   MCV 79.8 05/04/2011   PLT 50* 05/04/2011    BMET  Lab 05/04/11 0335  NA 126*  K 3.1*  CL 98  CO2 19  BUN 19  CREATININE 1.02  LABGLOM --  GLUCOSE 112*  CALCIUM 7.7*    Studies/Results: Dg Chest 2 View  05/03/2011  *RADIOLOGY REPORT*  Clinical Data: Pain, weakness  CHEST - 2 VIEW  Comparison: None.  Findings: Mild cardiac enlargement and vascular congestion. Basilar atelectasis noted.  Negative for CHF, definite pneumonia, effusion or pneumothorax.  Trachea midline.  IMPRESSION: Cardiomegaly without CHF.  Basilar atelectasis.  Original Report Authenticated By: Judie Petit. Ruel Favors, M.D.   Ct Head Wo Contrast  05/03/2011  *RADIOLOGY REPORT*  Clinical Data:  Fever, headaches  CT HEAD WITHOUT CONTRAST  Technique:  Contiguous axial  images were obtained from the base of the skull through the vertex without contrast  Comparison:  05/09/2010  Findings:  The brain has a normal appearance without evidence for hemorrhage, acute infarction, hydrocephalus, or mass lesion.  There is no extra axial fluid collection.  The skull and paranasal sinuses are normal.  IMPRESSION: Normal CT of the head without contrast.  Original Report Authenticated By: Judie Petit. Ruel Favors, M.D.    Medications:  Current Facility-Administered Medications  Medication Dose Route Frequency Provider Last Rate Last Dose  . ALPRAZolam (XANAX) tablet 0.5 mg  0.5 mg Oral Q8H PRN Debby Crosley, MD      . amitriptyline (ELAVIL) tablet 50 mg  50 mg Oral QHS Debby Crosley, MD      . cefTRIAXone (ROCEPHIN) 1 g in dextrose 5 % 50 mL IVPB  1 g Intravenous Once Cyndra Numbers, MD   1 g at 05/03/11 2206  . cefTRIAXone (ROCEPHIN) 1 g in dextrose 5 % 50 mL IVPB  1 g Intravenous Q24H Julian Crowford Darlina Guys., PHARMD      . efavirenz-emtrictabine-tenofovir (ATRIPLA) 600-200-300 MG per tablet 1 tablet  1 tablet Oral QHS Debby Crosley, MD      . furosemide (LASIX) tablet 40 mg  40 mg Oral Daily Amy Esterwood, PA      . ibuprofen (ADVIL,MOTRIN) tablet 800 mg  800 mg Oral Once [employer], PA   800 mg at 05/03/11 2019  .  lactulose (encephalopathy) (CHRONULAC) 10 GM/15ML solution 20 g  20 g Oral BID Debby Crosley, MD   20 g at 05/04/11 0320  . lidocaine (XYLOCAINE) 1 % (with pres) injection           . pantoprazole (PROTONIX) EC tablet 40 mg  40 mg Oral Daily Debby Crosley, MD      . potassium chloride SA (K-DUR,KLOR-CON) CR tablet 40 mEq  40 mEq Oral Once Gery Pray, MD   40 mEq at 05/04/11 0320  . sodium chloride 0.9 % bolus 1,000 mL  1,000 mL Intravenous Once Carlyle Dolly, PA   1,000 mL at 05/03/11 1947  . sodium chloride 0.9 % injection 3 mL  3 mL Intravenous PRN Debby Crosley, MD      . spironolactone (ALDACTONE) tablet 50 mg  50 mg Oral Daily Amy Esterwood, PA       . vancomycin (VANCOCIN) 1,500 mg in sodium chloride 0.9 % 500 mL IVPB  1,500 mg Intravenous To ER Jacquenette Shone Crowford Darlina Guys., PHARMD   1,500 mg at 05/04/11 0329  . DISCONTD: cefTRIAXone (ROCEPHIN) 1 g in dextrose 5 % 50 mL IVPB  1 g Intravenous Q24H Debby Crosley, MD      . DISCONTD: cefTRIAXone (ROCEPHIN) 1 g in dextrose 5 % 50 mL IVPB  1 g Intravenous Q24H Debby Crosley, MD      . DISCONTD: lidocaine (XYLOCAINE) 1 % injection 5 mL  5 mL Intradermal Once Cyndra Numbers, MD      . DISCONTD: vancomycin (VANCOCIN) IVPB 1000 mg/200 mL premix  1,000 mg Intravenous Q12H Julian Crowford Darlina Guys., PHARMD        Assessment/Plan: SBP (spontaneous bacterial peritonitis)/ascites Not sure that patient has SBP. But will continue him on the IV Rocephin and vancomycin. Infectious disease will evaluate patient. He also has a paracentesis schedule that still pending. Thanks for GI evaluation   HYPERTENSION Patient's blood pressure is presently running low. Will monitor for now and hold Lasix and spironolactone medications if systolic is less than 90.    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  Headache Patient did complain of headache on presentation. His headache has improved. He ha already been started on antibiotic. Will defer to ID whether to do LP or not..  LOS: 1 day   Earlene Plater MD, Ladell Pier 05/04/2011, 11:50 AM

## 2011-05-04 NOTE — Consult Note (Signed)
ANTIBIOTIC CONSULT NOTE - INITIAL  Pharmacy Consult for Vancomycin Indication: Empiric  Allergies  Allergen Reactions  . Penicillins     REACTION: hives    Patient Measurements: Height: 5\' 9"  (175.3 cm) Weight: 225 lb 15.5 oz (102.5 kg) IBW/kg (Calculated) : 70.7  Adjusted Body Weight:   Vital Signs: Temp: 98.6 F (37 C) (11/20 0139) Temp src: Oral (11/20 0139) BP: 102/63 mmHg (11/20 0139) Pulse Rate: 75  (11/20 0139) Intake/Output from previous day: 11/19 0701 - 11/20 0700 In: 220 [P.O.:220] Out: -  Intake/Output from this shift: Total I/O In: 220 [P.O.:220] Out: -   Labs:  Basename 05/04/11 0335 05/03/11 1950  WBC 7.3 10.4  HGB 7.9* 8.0*  PLT 50* 51*  LABCREA -- --  CREATININE -- 0.95   Estimated Creatinine Clearance: 114.6 ml/min (by C-G formula based on Cr of 0.95). No results found for this basename: VANCOTROUGH:2,VANCOPEAK:2,VANCORANDOM:2,GENTTROUGH:2,GENTPEAK:2,GENTRANDOM:2,TOBRATROUGH:2,TOBRAPEAK:2,TOBRARND:2,AMIKACINPEAK:2,AMIKACINTROU:2,AMIKACIN:2, in the last 72 hours   Microbiology: No results found for this or any previous visit (from the past 720 hour(s)).  Medical History: Past Medical History  Diagnosis Date  . HIV DISEASE 06/23/2006  . HYPERLIPIDEMIA 06/23/2006  . ANXIETY 06/23/2006  . DEPRESSION 06/23/2006  . 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 HYPERTENSION 06/23/2006  . 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 Ascites 11/13/2009  . STRAIN, CHEST WALL 03/15/2007  . 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 Gastric ulcer 06/2010  . Alcoholic cirrhosis of liver   . Alcoholism, chronic     Anti-infectives     Start     Dose/Rate Route Frequency Ordered Stop   05/05/11 1000   cefTRIAXone (ROCEPHIN) 1 g in dextrose 5 % 50 mL IVPB        1 g 100 mL/hr over 30 Minutes Intravenous Every 24 hours 05/04/11 0057     05/04/11 2200   efavirenz-emtrictabine-tenofovir (ATRIPLA) 600-200-300 MG per tablet 1 tablet       1 tablet Oral Daily at bedtime 05/04/11 0024     05/04/11 0045   vancomycin (VANCOCIN) 1,500 mg in sodium chloride 0.9 % 500 mL IVPB        1,500 mg 250 mL/hr over 120 Minutes Intravenous To Emergency Dept 05/04/11 0040 05/05/11 0045   05/04/11 0030   cefTRIAXone (ROCEPHIN) 1 g in dextrose 5 % 50 mL IVPB  Status:  Discontinued        1 g 100 mL/hr over 30 Minutes Intravenous Every 24 hours 05/04/11 0024 05/04/11 0057   05/03/11 2200   cefTRIAXone (ROCEPHIN) 1 g in dextrose 5 % 50 mL IVPB        1 g 100 mL/hr over 30 Minutes Intravenous  Once 05/03/11 2151 05/03/11 2236          Assessment: Pt with fever and empiric antibiotics ordered. 1st dose Vancomycin given on floor.  Goal of Therapy:  Vancomycin trough level 15-20 mcg/ml  Plan:  Measure antibiotic drug levels at steady state Follow up culture results vancomycin 1gm iv q12hr  Darlina Guys, Jacquenette Shone Crowford 05/04/2011,4:29 AM

## 2011-05-04 NOTE — Procedures (Signed)
45 yo male sent for a therapeutic ultra sound guided paracentesis.  Performed in Right lower quadrant. 1.1 liters of amber fluid removed. Post procedure ultra sound showed near complete resolution of ascites. Pt tolerated well. Minimal blood loss. No complications

## 2011-05-04 NOTE — H&P (Signed)
PCP:   Simone Curia, MD  ID: Dr. Ronnie Derby GI: Dr. Leone Payor PCP: Dr. Nedra Hai with [facility] in [place]  Chief Complaint:  Fever  HPI: This is a 45 year old gentleman with known history of HIV, who states that he saw his doctor last Thursday at that time he was feeling achy. By the time he got home he was having shaking chills and a temperature of 104. He did report myalgias. He saw his PCP Dr. Nedra Hai and was prescribed Tamiflu for a likely viral illness. Patient states he did not take the Tamiflu. He continued to feel ill and weak. In the course of 4 days he gained over 10 pounds. His abdominal girth became quite distended and he developed trace swelling on his legs, no scrotal edema. He is on Lasix, he took an extra 40 mg a few days ago. He also developed a worsening abdominal pain. His appetite has been poor, he is not eating because he felt so bloated. He states he's drank lots of water because he feels dehydrated. In addition to not taking the Tamiflu, he is also not taking his nighttime anti-retroviral medications since Thursday. He also reports a headache since Monday. He does not have a history of migraines. He reports no change in mental status. No blurred vision, no tinnitus. He states he is generally achy and has joint aches. He additionally complains of back pain that shoots down both legs. There is no evidence of GI bleeding, he states he has brown stool. In the ER patient was febrile and diaphoretic.  Dr. Leone Payor saw patient in the office today and recommended hospitalization, his concern was SBP, arrangements were made for direct admission by [facility] hospitalist however no beds were available, patient was sent to the ER. Dr. Daiva Eves infectious disease cross cover for Dr Luciana Axe was contacted by ER physician, he agrees with current treatment for SBP.  History obtained from patient, who is alert and oriented, currently in no acute distress  Review of Systems: (positives bolded) The patient  denies anorexia, fever, weight gain loss,, vision loss, decreased hearing, hoarseness, chest pain, syncope, dyspnea on exertion, peripheral edema, balance deficits, hemoptysis, abdominal pain, melena, hematochezia, severe indigestion/heartburn, hematuria, incontinence, genital sores, muscle weakness, suspicious skin lesions, transient blindness, difficulty walking, depression, unusual weight change, abnormal bleeding, enlarged lymph nodes, angioedema, and breast masses.  Past Medical History: Past Medical History  Diagnosis Date  . HIV DISEASE 06/23/2006  . HYPERLIPIDEMIA 06/23/2006  . ANXIETY 06/23/2006  . DEPRESSION 06/23/2006  . 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 HYPERTENSION 06/23/2006  . 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 Ascites 11/13/2009  . STRAIN, CHEST WALL 03/15/2007  . 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 Gastric ulcer 06/2010  . Alcoholic cirrhosis of liver   . Alcoholism, chronic    Past Surgical History  Procedure Date  . Esophagogastroduodenoscopy w/ banding 06/26/2010    variceal ligation  . Carpal tunnel release   . Esophagogastroduodenoscopy 07/01/2010;  08/12/10    small varices, gastric ulcer    Medications: Prior to Admission medications   Medication Sig Start Date End Date Taking? Authorizing Provider  ALPRAZolam Prudy Feeler) 0.5 MG tablet Take 0.5 mg by mouth every 8 (eight) hours as needed. For anxiety.  11/12/10  Yes [name], MD  amitriptyline (ELAVIL) 25 MG tablet Take 50 mg by mouth at bedtime.     Yes Historical Provider, MD  efavirenz-emtrictabine-tenofovir (ATRIPLA) 600-200-300 MG per tablet  Take 1 tablet by mouth at bedtime. 01/22/11  Yes Staci Righter, MD  esomeprazole (NEXIUM) 40 MG capsule Take 1 capsule (40 mg total) by mouth daily. 04/28/11 04/27/12 Yes Iva Boop, MD  furosemide (LASIX) 40 MG tablet Take 1 tablet (40 mg total) by mouth daily.  02/18/11  Yes Iva Boop, MD  [facility] 10 GM/15ML SOLN TAKE TWO TABLESPOONFULS(30ML) BY MOUTH 4 TIMES DAILY 12/21/10  Yes John Arletta Bale  oseltamivir (TAMIFLU) 75 MG capsule Take 150 mg by mouth daily.     Yes Historical Provider, MD  spironolactone (ALDACTONE) 50 MG tablet Take 50 mg by mouth 2 (two) times daily.   02/18/11  Yes Iva Boop, MD    Allergies:   Allergies  Allergen Reactions  . Penicillins     REACTION: hives    Social History:  reports that he has been smoking Cigarettes.  He has a 3 pack-year smoking history. He has never used smokeless tobacco. He reports that he does not drink alcohol or use illicit drugs.  Family History: Family History  Problem Relation Age of Onset  . Hyperlipidemia Mother   . Hypertension Mother   . Hypertension Father   . Drug abuse Brother   . Breast cancer Maternal Aunt   . Colon cancer Neg Hx   . Alcohol abuse Other   . Breast cancer Sister     Physical Exam: Filed Vitals:   05/03/11 1829 05/03/11 2128 05/03/11 2333  BP: 128/70 126/62 109/62  Pulse: 102 94 82  Temp: 102.3 F (39.1 C) 100.5 F (38.1 C) 99 F (37.2 C)  TempSrc: Oral Oral Oral  Resp: 22 20 22   Height: 5\' 9"  (1.753 m)    Weight: 102.967 kg (227 lb)    SpO2: 98% 98% 100%    General:  Alert and oriented times three, well developed and nourished, feels weak Eyes: PERRLA, pink conjunctiva, mild scleral icterus ENT: Moist oral mucosa, neck supple, no thyromegaly Lungs: clear to ascultation, no wheeze, no crackles, no use of accessory muscles Cardiovascular: regular rate and rhythm, no regurgitation, no gallops, no murmurs. No carotid bruits, no JVD Abdomen: soft, positive BS, non specific TTP, distended, not an acute abdomen GU: not examined Neuro: CN II - XII grossly intact, sensation intact, no nuchal rigidity  Musculoskeletal: strength 5/5 all extremities, no clubbing, cyanosis or edema Skin: no rash, no subcutaneous crepitation, no decubitus Psych:  appropriate patient   Labs on Admission:   [facility] 05/03/11 1950  NA 124*  K 3.2*  CL 95*  CO2 18*  GLUCOSE 118*  BUN 15  CREATININE 0.95  CALCIUM 8.0*  MG --  PHOS --    Basename 05/03/11 1950  AST 46*  ALT 27  ALKPHOS 114  BILITOT 2.6*  PROT 6.2  ALBUMIN 2.7*    Basename 05/03/11 1950  LIPASE 21  AMYLASE --    Basename 05/03/11 1950  WBC 10.4  NEUTROABS 7.8*  HGB 8.0*  HCT 23.9*  MCV 80.2  PLT 51*   No results found for this basename: CKTOTAL:3,CKMB:3,CKMBINDEX:3,TROPONINI:3 in the last 72 hours No results found for this basename: TSH,T4TOTAL,FREET3,T3FREE,THYROIDAB in the last 72 hours No results found for this basename: VITAMINB12:2,FOLATE:2,FERRITIN:2,TIBC:2,IRON:2,RETICCTPCT:2 in the last 72 hours  Radiological Exams on Admission: Dg Chest 2 View  05/03/2011  *RADIOLOGY REPORT*  Clinical Data: Pain, weakness  CHEST - 2 VIEW  Comparison: None.  Findings: Mild cardiac enlargement and vascular congestion. Basilar atelectasis noted.  Negative for CHF, definite pneumonia, effusion  or pneumothorax.  Trachea midline.  IMPRESSION: Cardiomegaly without CHF.  Basilar atelectasis.  Original Report Authenticated By: Judie Petit. Ruel Favors, M.D.   Ct Head Wo Contrast  05/03/2011  *RADIOLOGY REPORT*  Clinical Data:  Fever, headaches  CT HEAD WITHOUT CONTRAST  Technique:  Contiguous axial images were obtained from the base of the skull through the vertex without contrast  Comparison:  05/09/2010  Findings:  The brain has a normal appearance without evidence for hemorrhage, acute infarction, hydrocephalus, or mass lesion.  There is no extra axial fluid collection.  The skull and paranasal sinuses are normal.  IMPRESSION: Normal CT of the head without contrast.  Original Report Authenticated By: Judie Petit. Ruel Favors, M.D.    Assessment/Plan Present on Admission:  .Hyponatremia Likely due to fluid overload and diuretic use We'll fluid restrict, check urine sodium and osmolarity and  plasma osmolarity Check BMP in a.m.  Diuretics are held  possible SBP (spontaneous bacterial peritonitis) In the in the setting of increasing abdominal girth and discomfort Ultrasound guided paracentesis ordered for the a.m. Empiric antibiotics  patient's GI doctor Concha Se is aware of patient's admission, the first AM team him to call again in the a.m.  Patient with fever and increased white blood count by comparison to baseline, blood cultures have also been ordered Chronic anemia Chronic thrombocytopenia Liver cirrhosis History of alcohol abuse No evidence of bleeding We'll order an anemia panel Patient states he stopped drinking since March of 2012  Continue by mouth iron  Check ammonia level in a.m., continue lactulose  HIV Medication noncompliance Resume home meds Defer to a.m. team to consult ID as needed  .HYPERLIPIDEMIA .ANXIETY .DEPRESSION .IDIOPATHIC PERIPHERAL AUTONOMIC NEUROPATHY UNSP  stable Resume home meds Headache Currently thought to be due to possible SBP and viral syndrome. If patient becomes confused, low threshold for LP. Patient with no altered mental status  CODE STATUS full code SCD's/GI prophylaxis Team 7/Dr. Wandra Mannan    Arnetta Odeh 05/04/2011, 12:07 AM

## 2011-05-04 NOTE — Consult Note (Signed)
Date of Admission:  05/03/2011  Date of Consult:  05/04/2011  Reason for Consult: fever of unkown origin Referring Physician: Dr. Franklyn Lor is an 45 y.o. male. [name] is a 45 y.o. male who is doing superbly well on their antiviral regimen, with undetectable viral load and cd4 of 260 who also has  cirrhosis feeling ill shortly after he gave him a [facility]. He began developing diffuse myalgias body aches malaise headaches and fevers that got up to 103. He was seen by a physician in [facility] who thought the patient might have influenza and prescribed him Tamiflu. The patient has been on Tamiflu for at least the past 4 days. Despite the Orvan Falconer he continues to feel poorly and has had severe lower back pain as well as headaches. He also reports a 10 pound weight gain. He was seen by Dr. Faustino Congress yesterday in our gastroenterology. At a gas was concerned the patient in one patient be admitted to the hospital for further evaluation. Patient was seen in the emergency room where CT scan of the head was performed which was unrevealing. Peripheral blood work was performed urinalysis was negative chest x-ray was negative. I was called with regards to the idea of doing a paracentesis and I agree with checking for spontaneous bacterial peritonitis. Unfortunately ER physician was unable to perform a paracentesis. In the interim placed the patient on Rocephin and vancomycin. Overnight he felt improved on vancomycin and Rocephin. He is scheduled fora diagnostic paracentesis today. Were consulted to assist in the management of this patient with fever on origin and HIV.   Past Medical History  Diagnosis Date  . HIV DISEASE 06/23/2006  . HYPERLIPIDEMIA 06/23/2006  . ANXIETY 06/23/2006  . DEPRESSION 06/23/2006  . 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 HYPERTENSION 06/23/2006  . SINUSITIS, CHRONIC MAXILLARY  03/06/2007  . 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 Ascites 11/13/2009  . STRAIN, CHEST WALL 03/15/2007  . 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 Gastric ulcer 06/2010  . Alcoholic cirrhosis of liver   . Alcoholism, chronic     Past Surgical History  Procedure Date  . Esophagogastroduodenoscopy w/ banding 06/26/2010    variceal ligation  . Carpal tunnel release   . Esophagogastroduodenoscopy 07/01/2010;  08/12/10    small varices, gastric ulcer  ergies:   Allergies  Allergen Reactions  . Penicillins     REACTION: hives    Medications: I have reviewed the patient's current medications. Anti-infectives     Start     Dose/Rate Route Frequency Ordered Stop   05/05/11 1000   cefTRIAXone (ROCEPHIN) 1 g in dextrose 5 % 50 mL IVPB  Status:  Discontinued        1 g 100 mL/hr over 30 Minutes Intravenous Every 24 hours 05/04/11 0057 05/04/11 0730   05/04/11 2200  efavirenz-emtrictabine-tenofovir (ATRIPLA) 600-200-300 MG per tablet 1 tablet       1 tablet Oral Daily at bedtime 05/04/11 0024     05/04/11 1800   vancomycin (VANCOCIN) IVPB 1000 mg/200 mL premix  Status:  Discontinued        1,000 mg 200 mL/hr over 60 Minutes Intravenous Every 12 hours 05/04/11 0432 05/04/11 1140   05/04/11 1000   cefTRIAXone (ROCEPHIN) 1 g in dextrose 5 % 50 mL IVPB        1 g 100 mL/hr over 30 Minutes Intravenous  Every 24 hours 05/04/11 0730     05/04/11 0045   vancomycin (VANCOCIN) 1,500 mg in sodium chloride 0.9 % 500 mL IVPB        1,500 mg 250 mL/hr over 120 Minutes Intravenous To Emergency Dept 05/04/11 0040 05/04/11 0529   05/04/11 0030   cefTRIAXone (ROCEPHIN) 1 g in dextrose 5 % 50 mL IVPB  Status:  Discontinued        1 g 100 mL/hr over 30 Minutes Intravenous Every 24 hours 05/04/11 0024 05/04/11 0057   05/03/11 2200   cefTRIAXone (ROCEPHIN) 1 g in dextrose 5 % 50 mL IVPB        1 g 100 mL/hr over 30 Minutes Intravenous  Once 05/03/11 2151 05/03/11 2236            Social History:  reports that he has been smoking Cigarettes.  He has a 3 pack-year smoking history. He has never used smokeless tobacco. He reports that he does not drink alcohol or use illicit drugs.  Family History  Problem Relation Age of Onset  . Hyperlipidemia Mother   . Hypertension Mother   . Hypertension Father   . Drug abuse Brother   . Breast cancer Maternal Aunt   . Colon cancer Neg Hx   . Alcohol abuse Other   . Breast cancer Sister     History of present illness otherwise remainder of 12.2 systems is negative  Blood pressure 96/57, pulse 70, temperature 97.5 F (36.4 C), temperature source Oral, resp. rate 20, height 5\' 9"  (1.753 m), weight 225 lb 15.5 oz (102.5 kg), SpO2 100.00%. Is a gentleman in no acute distress alert and oriented x4.  HEENT: Normocephalic atraumatic pupils equal round react to light sclera anicteric oropharynx clear there is a lesions neck supple without cervical lymphadenopathy. No meningismus  Neck again without cervical lymphadenopathy.  Cardiovascular exam regular rate and rhythm no murmurs gastritis  Lungs clear to auscultation bilaterally without wheezes rhonchi or rales.  Abdomen protuberant with some dullness to percussion of the flanks. Minimally tender diffusely positive bowel sounds.  Extremities with trace edema.  Neurological exam nonfocal.  Skin no rashes or lesions.   Results for orders placed during the hospital encounter of 05/03/11 (from the past 48 hour(s))  CBC     Status: Abnormal   Collection Time   05/03/11  7:50 PM      Component Value Range Comment   WBC 10.4  4.0 - 10.5 (K/uL)    RBC 2.98 (*) 4.22 - 5.81 (MIL/uL)    Hemoglobin 8.0 (*) 13.0 - 17.0 (g/dL)    HCT 09.8 (*) 11.9 - 52.0 (%)    MCV 80.2  78.0 - 100.0 (fL)    MCH 26.8  26.0 - 34.0 (pg)    MCHC 33.5  30.0 - 36.0 (g/dL)    RDW 14.7 (*) 82.9 - 15.5 (%)    Platelets 51 (*) 150 - 400 (K/uL)   DIFFERENTIAL     Status: Abnormal   Collection  Time   05/03/11  7:50 PM      Component Value Range Comment   Neutrophils Relative 75  43 - 77 (%)    Lymphocytes Relative 11 (*) 12 - 46 (%)    Monocytes Relative 14 (*) 3 - 12 (%)    Eosinophils Relative 0  0 - 5 (%)    Basophils Relative 0  0 - 1 (%)    Neutro Abs 7.8 (*) 1.7 - 7.7 (K/uL)  Lymphs Abs 1.1  0.7 - 4.0 (K/uL)    Monocytes Absolute 1.5 (*) 0.1 - 1.0 (K/uL)    Eosinophils Absolute 0.0  0.0 - 0.7 (K/uL)    Basophils Absolute 0.0  0.0 - 0.1 (K/uL)    RBC Morphology POLYCHROMASIA PRESENT     COMPREHENSIVE METABOLIC PANEL     Status: Abnormal   Collection Time   05/03/11  7:50 PM      Component Value Range Comment   Sodium 124 (*) 135 - 145 (mEq/L)    Potassium 3.2 (*) 3.5 - 5.1 (mEq/L)    Chloride 95 (*) 96 - 112 (mEq/L)    CO2 18 (*) 19 - 32 (mEq/L)    Glucose, Bld 118 (*) 70 - 99 (mg/dL)    BUN 15  6 - 23 (mg/dL)    Creatinine, Ser 9.14  0.50 - 1.35 (mg/dL)    Calcium 8.0 (*) 8.4 - 10.5 (mg/dL)    Total Protein 6.2  6.0 - 8.3 (g/dL)    Albumin 2.7 (*) 3.5 - 5.2 (g/dL)    AST 46 (*) 0 - 37 (U/L)    ALT 27  0 - 53 (U/L)    Alkaline Phosphatase 114  39 - 117 (U/L)    Total Bilirubin 2.6 (*) 0.3 - 1.2 (mg/dL)    GFR calc non Af Amer >90  >90 (mL/min)    GFR calc Af Amer >90  >90 (mL/min)   LIPASE, BLOOD     Status: Normal   Collection Time   05/03/11  7:50 PM      Component Value Range Comment   Lipase 21  11 - 59 (U/L)   PROTIME-INR     Status: Abnormal   Collection Time   05/03/11  7:50 PM      Component Value Range Comment   Prothrombin Time 21.3 (*) 11.6 - 15.2 (seconds)    INR 1.81 (*) 0.00 - 1.49    PROCALCITONIN     Status: Normal   Collection Time   05/03/11  7:50 PM      Component Value Range Comment   Procalcitonin 2.82     URINALYSIS, ROUTINE W REFLEX MICROSCOPIC     Status: Abnormal   Collection Time   05/03/11  8:17 PM      Component Value Range Comment   Color, Urine AMBER (*) YELLOW  BIOCHEMICALS MAY BE AFFECTED BY COLOR   Appearance  CLEAR  CLEAR     Specific Gravity, Urine 1.017  1.005 - 1.030     pH 6.0  5.0 - 8.0     Glucose, UA NEGATIVE  NEGATIVE (mg/dL)    Hgb urine dipstick NEGATIVE  NEGATIVE     Bilirubin Urine NEGATIVE  NEGATIVE     Ketones, ur NEGATIVE  NEGATIVE (mg/dL)    Protein, ur NEGATIVE  NEGATIVE (mg/dL)    Urobilinogen, UA 1.0  0.0 - 1.0 (mg/dL)    Nitrite NEGATIVE  NEGATIVE     Leukocytes, UA NEGATIVE  NEGATIVE  MICROSCOPIC NOT DONE ON URINES WITH NEGATIVE PROTEIN, BLOOD, LEUKOCYTES, NITRITE, OR GLUCOSE <1000 mg/dL.  AMMONIA     Status: Normal   Collection Time   05/03/11  8:55 PM      Component Value Range Comment   Ammonia 45  11 - 60 (umol/L)   LACTIC ACID, PLASMA     Status: Abnormal   Collection Time   05/03/11  8:55 PM      Component Value Range Comment  Lactic Acid, Venous 2.8 (*) 0.5 - 2.2 (mmol/L)   BASIC METABOLIC PANEL     Status: Abnormal   Collection Time   05/04/11  3:35 AM      Component Value Range Comment   Sodium 126 (*) 135 - 145 (mEq/L)    Potassium 3.1 (*) 3.5 - 5.1 (mEq/L)    Chloride 98  96 - 112 (mEq/L)    CO2 19  19 - 32 (mEq/L)    Glucose, Bld 112 (*) 70 - 99 (mg/dL)    BUN 19  6 - 23 (mg/dL)    Creatinine, Ser 4.09  0.50 - 1.35 (mg/dL)    Calcium 7.7 (*) 8.4 - 10.5 (mg/dL)    GFR calc non Af Amer 86 (*) >90 (mL/min)    GFR calc Af Amer >90  >90 (mL/min)   CBC     Status: Abnormal   Collection Time   05/04/11  3:35 AM      Component Value Range Comment   WBC 7.3  4.0 - 10.5 (K/uL)    RBC 2.97 (*) 4.22 - 5.81 (MIL/uL)    Hemoglobin 7.9 (*) 13.0 - 17.0 (g/dL)    HCT 81.1 (*) 91.4 - 52.0 (%)    MCV 79.8  78.0 - 100.0 (fL)    MCH 26.6  26.0 - 34.0 (pg)    MCHC 33.3  30.0 - 36.0 (g/dL)    RDW 78.2 (*) 95.6 - 15.5 (%)    Platelets 50 (*) 150 - 400 (K/uL)   VITAMIN B12     Status: Abnormal   Collection Time   05/04/11  3:35 AM      Component Value Range Comment   Vitamin B-12 1096 (*) 211 - 911 (pg/mL)   FOLATE     Status: Normal   Collection Time    05/04/11  3:35 AM      Component Value Range Comment   Folate 12.8     IRON AND TIBC     Status: Abnormal   Collection Time   05/04/11  3:35 AM      Component Value Range Comment   Iron 18 (*) 42 - 135 (ug/dL)    TIBC 213  086 - 578 (ug/dL)    Saturation Ratios 7 (*) 20 - 55 (%)    UIBC 255  125 - 400 (ug/dL)   FERRITIN     Status: Normal   Collection Time   05/04/11  3:35 AM      Component Value Range Comment   Ferritin 42  22 - 322 (ng/mL)   RETICULOCYTES     Status: Abnormal   Collection Time   05/04/11  3:35 AM      Component Value Range Comment   Retic Ct Pct 2.6  0.4 - 3.1 (%)    RBC. 2.97 (*) 4.22 - 5.81 (MIL/uL)    Retic Count, Manual 77.2  19.0 - 186.0 (K/uL)   TSH     Status: Normal   Collection Time   05/04/11  3:35 AM      Component Value Range Comment   TSH 1.288  0.350 - 4.500 (uIU/mL)   AMMONIA     Status: Normal   Collection Time   05/04/11  3:35 AM      Component Value Range Comment   Ammonia 19  11 - 60 (umol/L)   PROTIME-INR     Status: Abnormal   Collection Time   05/04/11  3:35 AM  Component Value Range Comment   Prothrombin Time 23.8 (*) 11.6 - 15.2 (seconds)    INR 2.09 (*) 0.00 - 1.49    MAGNESIUM     Status: Normal   Collection Time   05/04/11  3:35 AM      Component Value Range Comment   Magnesium 1.9  1.5 - 2.5 (mg/dL)    No results found for this basename: sdes, specrequest, cult, reptstatus   Dg Chest 2 View  05/03/2011  *RADIOLOGY REPORT*  Clinical Data: Pain, weakness  CHEST - 2 VIEW  Comparison: None.  Findings: Mild cardiac enlargement and vascular congestion. Basilar atelectasis noted.  Negative for CHF, definite pneumonia, effusion or pneumothorax.  Trachea midline.  IMPRESSION: Cardiomegaly without CHF.  Basilar atelectasis.  Original Report Authenticated By: Judie Petit. Ruel Favors, M.D.   Ct Head Wo Contrast  05/03/2011  *RADIOLOGY REPORT*  Clinical Data:  Fever, headaches  CT HEAD WITHOUT CONTRAST  Technique:  Contiguous axial  images were obtained from the base of the skull through the vertex without contrast  Comparison:  05/09/2010  Findings:  The brain has a normal appearance without evidence for hemorrhage, acute infarction, hydrocephalus, or mass lesion.  There is no extra axial fluid collection.  The skull and paranasal sinuses are normal.  IMPRESSION: Normal CT of the head without contrast.  Original Report Authenticated By: Judie Petit. Ruel Favors, M.D.     No results found for this or any previous visit (from the past 720 hour(s)).  Impression/Recommendation  FUO:  I do worry about spontaneous bacterial peritonitis. I agree with obtaining a paracentesis for cell count differential protein albumin glucose as well as cultures for bacteria fungi and AFB. The patient's presentation a slightly elevated white count slightly elevated lactic acid and slightly elevated protein calcitonin is than would be more suggestive of a possible bacterial infection. No clear indication for vancomycin here and I would use ceftriaxone as reasonable for coverage for SBP. His recent treatment with Tamiflu and concern for influenza I will place the patient on contact and cautions. We'll check PCR for influenza and make sure the patient has finished a five-day course of Tamiflu. He continued to have symptoms of fever I would consider imaging his back with an MRI given his lower back pain at this point in time I don't feel this quite indicated.  IP Droplet precautions, given he is with Tamiflu. We'll check influenza PCR although it may be falsely negative given the fact the patient has been receiving Tamiflu already. I would keep the patient on droplet precautions until he clearly found a source for his fevers and infection.  HIV: Continue Atripla the patient has been has been highly adherent.  Thank you so much for this interesting consult,   Acey Lav 05/04/2011, 11:42 AM

## 2011-05-05 ENCOUNTER — Inpatient Hospital Stay (HOSPITAL_COMMUNITY): Payer: Medicaid Other

## 2011-05-05 DIAGNOSIS — R509 Fever, unspecified: Secondary | ICD-10-CM

## 2011-05-05 DIAGNOSIS — B20 Human immunodeficiency virus [HIV] disease: Secondary | ICD-10-CM

## 2011-05-05 LAB — BASIC METABOLIC PANEL
BUN: 13 mg/dL (ref 6–23)
CO2: 19 mEq/L (ref 19–32)
Chloride: 103 mEq/L (ref 96–112)
Creatinine, Ser: 0.82 mg/dL (ref 0.50–1.35)
GFR calc Af Amer: >90 mL/min (ref >90)
Potassium: 3.2 mEq/L — ABNORMAL LOW (ref 3.5–5.1)
Sodium: 130 mEq/L — ABNORMAL LOW (ref 135–145)

## 2011-05-05 LAB — CBC
HCT: 22.1 % — ABNORMAL LOW (ref 39.0–52.0)
Hemoglobin: 7.4 g/dL — ABNORMAL LOW (ref 13.0–17.0)
MCHC: 33.5 g/dL (ref 30.0–36.0)
MCV: 79.8 fL (ref 78.0–100.0)
RBC: 2.77 MIL/uL — ABNORMAL LOW (ref 4.22–5.81)
RDW: 18.4 % — ABNORMAL HIGH (ref 11.5–15.5)
WBC: 7 10*3/uL (ref 4.0–10.5)

## 2011-05-05 LAB — INFLUENZA PANEL BY PCR (TYPE A & B): Influenza A By PCR: NEGATIVE

## 2011-05-05 MED ORDER — ZOLPIDEM TARTRATE 10 MG PO TABS
10.0000 mg | ORAL_TABLET | Freq: Every evening | ORAL | Status: DC | PRN
  Administered 2011-05-05 – 2011-05-07 (×3): 10 mg via ORAL
  Filled 2011-05-05 (×3): qty 1

## 2011-05-05 MED ORDER — IOHEXOL 300 MG/ML  SOLN
100.0000 mL | Freq: Once | INTRAMUSCULAR | Status: AC | PRN
  Administered 2011-05-05: 100 mL via INTRAVENOUS

## 2011-05-05 MED ORDER — SODIUM CHLORIDE 0.9 % IV SOLN
INTRAVENOUS | Status: DC
  Administered 2011-05-05: 22:00:00 via INTRAVENOUS

## 2011-05-05 MED ORDER — METRONIDAZOLE 500 MG PO TABS
500.0000 mg | ORAL_TABLET | Freq: Three times a day (TID) | ORAL | Status: DC
  Administered 2011-05-05 – 2011-05-08 (×8): 500 mg via ORAL
  Filled 2011-05-05 (×16): qty 1

## 2011-05-05 MED ORDER — POTASSIUM CHLORIDE CRYS ER 20 MEQ PO TBCR
40.0000 meq | EXTENDED_RELEASE_TABLET | Freq: Once | ORAL | Status: AC
  Administered 2011-05-05: 40 meq via ORAL
  Filled 2011-05-05: qty 2

## 2011-05-05 MED ORDER — FERROUS SULFATE 325 (65 FE) MG PO TABS
325.0000 mg | ORAL_TABLET | Freq: Two times a day (BID) | ORAL | Status: DC
  Administered 2011-05-06 – 2011-05-08 (×6): 325 mg via ORAL
  Filled 2011-05-05 (×9): qty 1

## 2011-05-05 NOTE — Progress Notes (Signed)
Patient ID: [name], male   DOB: 09/02/1965, 45 y.o.   MRN: [id number] [name]  Subjective: [name] feels better overall since admission however he continues to have fevers and sweats. MAXIMUM TEMPERATURE was 101.2 in the past 24 hours He continues to complain of a frontal headache low backache and aching in his bones all of which are not as severe. He has no other specific symptoms. Patient did have paracentesis done yesterday however this was done as a therapeutic paracentesis and ultrasound did not see the orders for cell counts culture etc. and no sample was sent to the lab.  Objective:  Vital signs in last 24 hours: Temp:  [99.3 F (37.4 C)-101.6 F (38.7 C)] 99.3 F (37.4 C) (11/21 0541) Pulse Rate:  [80-90] 80  (11/21 0541) Resp:  [21-26] 21  (11/21 0541) BP: (105-124)/(64-70) 110/70 mmHg (11/21 0541) SpO2:  [96 %-98 %] 96 % (11/21 0541) Last BM Date: 05/04/11 General:   Alert,  Well-developed, somewhat ill-appearing, pleasant Heart:  Regular rate and rhythm; no murmurs Abdomen:  Soft, nontender and nondistended. Normal bowel sounds, without guarding, and without rebound.   Extremities:  Without edema. Neurologic:  Alert and  oriented x4;  grossly normal neurologically, no asterixis Psych:  Alert and cooperative. Normal mood and affect.  Intake/Output from previous day: 11/20 0701 - 11/21 0700 In: 919 [P.O.:869; IV Piggyback:50] Out: -  Intake/Output this shift:    Lab Results:  Basename 05/05/11 0324 05/04/11 0335 05/03/11 1950  WBC 7.0 7.3 10.4  HGB 7.4* 7.9* 8.0*  HCT 22.1* 23.7* 23.9*  PLT 50* 50* 51*   BMET  Basename 05/05/11 0324 05/04/11 0335 05/03/11 1950  NA 130* 126* 124*  K 3.2* 3.1* 3.2*  CL 103 98 95*  CO2 19 19 18*  GLUCOSE 97 112* 118*  BUN 13 19 15   CREATININE 0.82 1.02 0.95  CALCIUM 7.6* 7.7* 8.0*   LFT  Basename 05/03/11 1950  PROT 6.2  ALBUMIN 2.7*  AST 46*  ALT 27  ALKPHOS 114  BILITOT 2.6*    BILIDIR --  IBILI --   PT/INR  Basename 05/04/11 0335 05/03/11 1950  LABPROT 23.8* 21.3*  INR 2.09* 1.81*   Hepatitis Panel No results found for this basename: HEPBSAG,HCVAB,HEPAIGM,HEPBIGM in the last 72 hours  Studies/Results: Dg Chest 2 View  05/03/2011  *RADIOLOGY REPORT*  Clinical Data: Pain, weakness  CHEST - 2 VIEW  Comparison: None.  Findings: Mild cardiac enlargement and vascular congestion. Basilar atelectasis noted.  Negative for CHF, definite pneumonia, effusion or pneumothorax.  Trachea midline.  IMPRESSION: Cardiomegaly without CHF.  Basilar atelectasis.  Original Report Authenticated By: Judie Petit. Ruel Favors, M.D.   Ct Head Wo Contrast  05/03/2011  *RADIOLOGY REPORT*  Clinical Data:  Fever, headaches  CT HEAD WITHOUT CONTRAST  Technique:  Contiguous axial images were obtained from the base of the skull through the vertex without contrast  Comparison:  05/09/2010  Findings:  The brain has a normal appearance without evidence for hemorrhage, acute infarction, hydrocephalus, or mass lesion.  There is no extra axial fluid collection.  The skull and paranasal sinuses are normal.  IMPRESSION: Normal CT of the head without contrast.  Original Report Authenticated By: Judie Petit. Ruel Favors, M.D.   US Paracentesis  05/04/2011  *RADIOLOGY REPORT*  Clinical Data: This is a pleasant 45 year old male with a history of cirrhosis and ascites who was sent for an ultrasound-guided therapeutic paracentesis.  ULTRASOUND GUIDED PARACENTESIS  Comparison:  None  An  ultrasound guided paracentesis was thoroughly discussed with the patient and questions answered.  The benefits, risks, alternatives and complications were also discussed.  The patient understands and wishes to proceed with the procedure.  Written consent was obtained.  Ultrasound was performed to localize and mark an adequate pocket of fluid in the right lower quadrant of the abdomen.  The area was then prepped and draped in the normal sterile  fashion.  1% Lidocaine was used for local anesthesia.  Under ultrasound guidance a 19 gauge Yueh catheter was introduced.  Paracentesis was performed.  The catheter was removed and a dressing applied.  Complications:  None  Findings:  A total of approximately 1.1 liters of amber fluid was removed.  A fluid sample was not sent for laboratory analysis.  IMPRESSION: Successful ultrasound guided paracentesis yielding 1.1 liters of ascites.  Read by: Kinnie Scales. Rinehuls, P.A.-C  Original Report Authenticated By: Richarda Overlie, M.D.     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 Patient also with HIV, admitted with an acute febrile illness onset 11/16/ 2012.. Workup is in progress per ID , he may have influenza. He has no abdominal pain or tenderness in the course of his illness is unusual for SBP. Unfortunately with paracentesis yesterday sound did not see the orders for cell count Gram stain etc. and nothing was sent to the lab. I called this morning and checked, and the fluid was not saved. He has been on IV antibiotics over the past 3 days, and feel the yield would be low with repeat diagnostic tap.  He is stable from a GI standpoint., Would plan to discharge home on his same outpatient regimen of lactulose and diuretics, and continue 2 g sodium diet. We will plan to follow him up in the office in a couple of weeks and we'll make an appointment Dr. Lina Sar  available for GI problems over the next few days     LOS: 2 days   [name]  05/05/2011, 9:19 AM

## 2011-05-05 NOTE — Progress Notes (Signed)
Subjective: tired  Objective: Weight change:   Intake/Output Summary (Last 24 hours) at 05/05/11 1512 Last data filed at 05/05/11 1350  Gross per 24 hour  Intake   1159 ml  Output      0 ml  Net   1159 ml   Blood pressure 110/70, pulse 80, temperature 99.3 F (37.4 C), temperature source Oral, resp. rate 21, height 5\' 9"  (1.753 m), weight 225 lb 15.5 oz (102.5 kg), SpO2 96.00%. Temp:  [99.3 F (37.4 C)-101.6 F (38.7 C)] 99.3 F (37.4 C) (11/21 0541) Pulse Rate:  [80-90] 80  (11/21 0541) Resp:  [21-23] 21  (11/21 0541) BP: (110-124)/(65-70) 110/70 mmHg (11/21 0541) SpO2:  [96 %-97 %] 96 % (11/21 0541)  Physical Exam: General: Alert and awake, oriented x3, not in any acute distress. HEENT: anicteric sclera, pupils reactive to light and accommodation, EOMI CVS regular rate, normal r,  no murmur rubs or gallops Chest: clear to auscultation bilaterally, no wheezing, rales or rhonchi Abdomen: softt, distended, dullness oto percussion on flanks, pos bowel sounds Extremities: no  clubbing or edema noted bilaterally Skin: no rashes  Neuro: nonfocal  Lab Results:  Basename 05/05/11 0324 05/04/11 0335  WBC 7.0 7.3  HGB 7.4* 7.9*  HCT 22.1* 23.7*  PLT 50* 50*   BMET  Basename 05/05/11 0324 05/04/11 0335  NA 130* 126*  K 3.2* 3.1*  CL 103 98  CO2 19 19  GLUCOSE 97 112*  BUN 13 19  CREATININE 0.82 1.02  CALCIUM 7.6* 7.7*    Micro Results: Recent Results (from the past 240 hour(s))  CULTURE, BLOOD (ROUTINE X 2)     Status: Normal (Preliminary result)   Collection Time   05/03/11  8:50 PM      Component Value Range Status Comment   Specimen Description BLOOD LEFT AC   Final    Special Requests BOTTLES DRAWN AEROBIC AND ANAEROBIC 5 CC EACH   Final    Setup Time [id number]   Final    Culture     Final    Value:        BLOOD CULTURE RECEIVED NO GROWTH TO DATE CULTURE WILL BE HELD FOR 5 DAYS BEFORE ISSUING A FINAL NEGATIVE REPORT   Report Status PENDING   Incomplete    CULTURE, BLOOD (ROUTINE X 2)     Status: Normal (Preliminary result)   Collection Time   05/03/11  8:55 PM      Component Value Range Status Comment   Specimen Description BLOOD LEFT HAND   Final    Special Requests BOTTLES DRAWN AEROBIC AND ANAEROBIC 5 CC EACH   Final    Setup Time [id number]   Final    Culture     Final    Value:        BLOOD CULTURE RECEIVED NO GROWTH TO DATE CULTURE WILL BE HELD FOR 5 DAYS BEFORE ISSUING A FINAL NEGATIVE REPORT   Report Status PENDING   Incomplete     Studies/Results: Dg Chest 2 View  05/03/2011  *RADIOLOGY REPORT*  Clinical Data: Pain, weakness  CHEST - 2 VIEW  Comparison: None.  Findings: Mild cardiac enlargement and vascular congestion. Basilar atelectasis noted.  Negative for CHF, definite pneumonia, effusion or pneumothorax.  Trachea midline.  IMPRESSION: Cardiomegaly without CHF.  Basilar atelectasis.  Original Report Authenticated By: Judie Petit. Ruel Favors, M.D.   Ct Head Wo Contrast  05/03/2011  *RADIOLOGY REPORT*  Clinical Data:  Fever, headaches  CT HEAD WITHOUT CONTRAST  Technique:  Contiguous axial images were obtained from the base of the skull through the vertex without contrast  Comparison:  05/09/2010  Findings:  The brain has a normal appearance without evidence for hemorrhage, acute infarction, hydrocephalus, or mass lesion.  There is no extra axial fluid collection.  The skull and paranasal sinuses are normal.  IMPRESSION: Normal CT of the head without contrast.  Original Report Authenticated By: Judie Petit. Ruel Favors, M.D.   US Paracentesis  05/04/2011  *RADIOLOGY REPORT*  Clinical Data: This is a pleasant 45 year old male with a history of cirrhosis and ascites who was sent for an ultrasound-guided therapeutic paracentesis.  ULTRASOUND GUIDED PARACENTESIS  Comparison:  None  An ultrasound guided paracentesis was thoroughly discussed with the patient and questions answered.  The benefits, risks, alternatives and complications were also  discussed.  The patient understands and wishes to proceed with the procedure.  Written consent was obtained.  Ultrasound was performed to localize and mark an adequate pocket of fluid in the right lower quadrant of the abdomen.  The area was then prepped and draped in the normal sterile fashion.  1% Lidocaine was used for local anesthesia.  Under ultrasound guidance a 19 gauge Yueh catheter was introduced.  Paracentesis was performed.  The catheter was removed and a dressing applied.  Complications:  None  Findings:  A total of approximately 1.1 liters of amber fluid was removed.  A fluid sample was not sent for laboratory analysis.  IMPRESSION: Successful ultrasound guided paracentesis yielding 1.1 liters of ascites.  Read by: Kinnie Scales. Rinehuls, P.A.-C  Original Report Authenticated By: Richarda Overlie, M.D.    Antibiotics:  Anti-infectives     Start     Dose/Rate Route Frequency Ordered Stop   05/05/11 1000   cefTRIAXone (ROCEPHIN) 1 g in dextrose 5 % 50 mL IVPB  Status:  Discontinued        1 g 100 mL/hr over 30 Minutes Intravenous Every 24 hours 05/04/11 0057 05/04/11 0730   05/04/11 2200  efavirenz-emtrictabine-tenofovir (ATRIPLA) 600-200-300 MG per tablet 1 tablet       1 tablet Oral Daily at bedtime 05/04/11 0024     05/04/11 1800   vancomycin (VANCOCIN) IVPB 1000 mg/200 mL premix  Status:  Discontinued        1,000 mg 200 mL/hr over 60 Minutes Intravenous Every 12 hours 05/04/11 0432 05/04/11 1140   05/04/11 1000   cefTRIAXone (ROCEPHIN) 1 g in dextrose 5 % 50 mL IVPB        1 g 100 mL/hr over 30 Minutes Intravenous Every 24 hours 05/04/11 0730     05/04/11 0045   vancomycin (VANCOCIN) 1,500 mg in sodium chloride 0.9 % 500 mL IVPB        1,500 mg 250 mL/hr over 120 Minutes Intravenous To Emergency Dept 05/04/11 0040 05/04/11 0529   05/04/11 0030   cefTRIAXone (ROCEPHIN) 1 g in dextrose 5 % 50 mL IVPB  Status:  Discontinued        1 g 100 mL/hr over 30 Minutes Intravenous Every 24 hours  05/04/11 0024 05/04/11 0057   05/03/11 2200   cefTRIAXone (ROCEPHIN) 1 g in dextrose 5 % 50 mL IVPB        1 g 100 mL/hr over 30 Minutes Intravenous  Once 05/03/11 2151 05/03/11 2236          Medications: Scheduled Meds:   . amitriptyline  50 mg Oral QHS  . cefTRIAXone (ROCEPHIN) IV  1 g Intravenous Q24H  .  efavirenz-emtrictabine-tenofovir  1 tablet Oral QHS  . ferrous sulfate  325 mg Oral BID WC  . influenza  inactive virus vaccine  0.5 mL Intramuscular Tomorrow-1000  . lactulose (encephalopathy)  20 g Oral BID  . pantoprazole  40 mg Oral Daily  . potassium chloride  40 mEq Oral Once  . spironolactone  50 mg Oral Daily  . zolpidem  10 mg Oral Once  . DISCONTD: furosemide  40 mg Oral Daily   Continuous Infusions:  PRN Meds:.ALPRAZolam, sodium chloride, DISCONTD: acetaminophen  Assessment/Plan: [name] is a 45 y.o. male with  Superbly contolled HIV, also with cirrhosis and ascites and FUO  1) FUO: Unfortunately the 1L worth of fluid removed was NOT sent for cell count and differential, or cultures or other analyses. It was described as straw colored. I have NO way of knowing in this context if this man has SBP or not --I will order stat CT chest abdomen and pelvis to look for occult abscess, liver absces, cause for FUO --may need to reaspirate ascites and send for cell count and differential and cultures for Bacteria, afb and fungi --CT will also give [place] data on his spine --DC tylenol so that we dont mask his fevers --followup cultures --OK to continue rocephin for now but we have no idea what we are treating beyond possible SBP. 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

## 2011-05-05 NOTE — Progress Notes (Signed)
Subjective: Feeling better today. Denies headaches. Relates headaches start when he has fever. Denies cough, dysuria, diarrhea.  Objective: Filed Vitals:   05/04/11 0534 05/04/11 1335 05/04/11 2100 05/05/11 0541  BP: 96/57 105/64 117/67 110/70  Pulse: 70 84 90 80  Temp: 97.5 F (36.4 C) 100.3 F (37.9 C) 101.6 F (38.7 C) 99.3 F (37.4 C)  TempSrc:  Oral Oral   Resp: 20 26 23 21   Height:      Weight:      SpO2: 100% 98% 97% 96%   Weight change:   Intake/Output Summary (Last 24 hours) at 05/05/11 0956 Last data filed at 05/05/11 0500  Gross per 24 hour  Intake    919 ml  Output      0 ml  Net    919 ml    General: Alert, awake, oriented x3, in no acute distress.  HEENT: No bruits, no goiter.  Heart: Regular rate and rhythm, without murmurs, rubs, gallops.  Lungs:, bilateral air movement mild crackles at bases.  Abdomen: Soft, nontender, nondistended, positive bowel sounds.  Neuro: Grossly intact, nonfocal. Extremities: no edema.    Lab Results:  [facility] 05/05/11 0324 05/04/11 0335  NA 130* 126*  K 3.2* 3.1*  CL 103 98  CO2 19 19  GLUCOSE 97 112*  BUN 13 19  CREATININE 0.82 1.02  CALCIUM 7.6* 7.7*  MG -- 1.9  PHOS -- --    Basename 05/03/11 1950  AST 46*  ALT 27  ALKPHOS 114  BILITOT 2.6*  PROT 6.2  ALBUMIN 2.7*    Basename 05/03/11 1950  LIPASE 21  AMYLASE --    Basename 05/05/11 0324 05/04/11 0335 05/03/11 1950  WBC 7.0 7.3 --  NEUTROABS -- -- 7.8*  HGB 7.4* 7.9* --  HCT 22.1* 23.7* --  MCV 79.8 79.8 --  PLT 50* 50* --    Basename 05/04/11 0335  TSH 1.288  T4TOTAL --  T3FREE --  THYROIDAB --    Basename 05/04/11 0335  VITAMINB12 1096*  FOLATE 12.8  FERRITIN 42  TIBC 273  IRON 18*  RETICCTPCT 2.6    Micro Results: Recent Results (from the past 240 hour(s))  CULTURE, BLOOD (ROUTINE X 2)     Status: Normal (Preliminary result)   Collection Time   05/03/11  8:50 PM      Component Value Range Status Comment   Specimen  Description BLOOD LEFT AC   Final    Special Requests BOTTLES DRAWN AEROBIC AND ANAEROBIC 5 CC EACH   Final    Setup Time [id number]   Final    Culture     Final    Value:        BLOOD CULTURE RECEIVED NO GROWTH TO DATE CULTURE WILL BE HELD FOR 5 DAYS BEFORE ISSUING A FINAL NEGATIVE REPORT   Report Status PENDING   Incomplete   CULTURE, BLOOD (ROUTINE X 2)     Status: Normal (Preliminary result)   Collection Time   05/03/11  8:55 PM      Component Value Range Status Comment   Specimen Description BLOOD LEFT HAND   Final    Special Requests BOTTLES DRAWN AEROBIC AND ANAEROBIC 5 CC EACH   Final    Setup Time [id number]   Final    Culture     Final    Value:        BLOOD CULTURE RECEIVED NO GROWTH TO DATE CULTURE WILL BE HELD FOR 5 DAYS BEFORE ISSUING A FINAL NEGATIVE  REPORT   Report Status PENDING   Incomplete     Studies/Results: Dg Chest 2 View  05/03/2011  *RADIOLOGY REPORT*  Clinical Data: Pain, weakness  CHEST - 2 VIEW  Comparison: None.  Findings: Mild cardiac enlargement and vascular congestion. Basilar atelectasis noted.  Negative for CHF, definite pneumonia, effusion or pneumothorax.  Trachea midline.  IMPRESSION: Cardiomegaly without CHF.  Basilar atelectasis.  Original Report Authenticated By: Judie Petit. Ruel Favors, M.D.   Ct Head Wo Contrast  05/03/2011  *RADIOLOGY REPORT*  Clinical Data:  Fever, headaches  CT HEAD WITHOUT CONTRAST  Technique:  Contiguous axial images were obtained from the base of the skull through the vertex without contrast  Comparison:  05/09/2010  Findings:  The brain has a normal appearance without evidence for hemorrhage, acute infarction, hydrocephalus, or mass lesion.  There is no extra axial fluid collection.  The skull and paranasal sinuses are normal.  IMPRESSION: Normal CT of the head without contrast.  Original Report Authenticated By: Judie Petit. Ruel Favors, M.D.   US Paracentesis  05/04/2011  *RADIOLOGY REPORT*  Clinical Data: This is a pleasant  45 year old male with a history of cirrhosis and ascites who was sent for an ultrasound-guided therapeutic paracentesis.  ULTRASOUND GUIDED PARACENTESIS  Comparison:  None  An ultrasound guided paracentesis was thoroughly discussed with the patient and questions answered.  The benefits, risks, alternatives and complications were also discussed.  The patient understands and wishes to proceed with the procedure.  Written consent was obtained.  Ultrasound was performed to localize and mark an adequate pocket of fluid in the right lower quadrant of the abdomen.  The area was then prepped and draped in the normal sterile fashion.  1% Lidocaine was used for local anesthesia.  Under ultrasound guidance a 19 gauge Yueh catheter was introduced.  Paracentesis was performed.  The catheter was removed and a dressing applied.  Complications:  None  Findings:  A total of approximately 1.1 liters of amber fluid was removed.  A fluid sample was not sent for laboratory analysis.  IMPRESSION: Successful ultrasound guided paracentesis yielding 1.1 liters of ascites.  Read by: Kinnie Scales. Rinehuls, P.A.-C  Original Report Authenticated By: Richarda Overlie, M.D.    Medications: I have reviewed the patient's current medications.   Patient Active Hospital Problem List:  SBP (spontaneous bacterial peritonitis) / Fever Patient still spiking fevers. Continue with Ceftriaxone day 2. Blood culture no growth to date. Will defer MRI lumbar spine order  to ID.  Post US paracentesis, fluid was not send for culture and cell count.     HIV DISEASE (06/23/2006) Continue with Atripla.   HYPERLIPIDEMIA (06/23/2006)  ANXIETY (06/23/2006) Continue with Xanax PRN.   DEPRESSION (06/23/2006) Continue with Amitriptyline.   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   Other ascites (11/13/2009) Post therapeutic paracentesis.     Alcoholic cirrhosis of liver (07/15/2010) Continue with Lasix,  spironolactone, Lactulose.    Hyponatremia (05/03/2011) Improving. Likely secondary to cirrhosis.    Hypokalemia (05/04/2011) Will replete with KCL 40 meq. I will check Mg level.   Headache (05/04/2011); Improved.  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 Monitor.  Anemia: Monitor Hb. No transfusion at this time. I will start ferrous sulfate.        LOS: 2 days   Mukhtar Shams M.D. Pager: [phone] [facility] 05/05/2011, 9:56 AM

## 2011-05-06 ENCOUNTER — Inpatient Hospital Stay (HOSPITAL_COMMUNITY): Payer: Medicaid Other

## 2011-05-06 LAB — CBC
HCT: 22.1 % — ABNORMAL LOW (ref 39.0–52.0)
Hemoglobin: 7.5 g/dL — ABNORMAL LOW (ref 13.0–17.0)
MCHC: 33.9 g/dL (ref 30.0–36.0)
MCV: 78.6 fL (ref 78.0–100.0)
RBC: 2.81 MIL/uL — ABNORMAL LOW (ref 4.22–5.81)
RDW: 18.3 % — ABNORMAL HIGH (ref 11.5–15.5)
WBC: 5.7 10*3/uL (ref 4.0–10.5)

## 2011-05-06 LAB — COMPREHENSIVE METABOLIC PANEL
Albumin: 2.1 g/dL — ABNORMAL LOW (ref 3.5–5.2)
Alkaline Phosphatase: 89 U/L (ref 39–117)
BUN: 10 mg/dL (ref 6–23)
Creatinine, Ser: 0.77 mg/dL (ref 0.50–1.35)
GFR calc Af Amer: >90 mL/min (ref >90)
GFR calc non Af Amer: >90 mL/min (ref >90)
Glucose, Bld: 89 mg/dL (ref 70–99)
Potassium: 3.1 mEq/L — ABNORMAL LOW (ref 3.5–5.1)
Total Bilirubin: 1.4 mg/dL — ABNORMAL HIGH (ref 0.3–1.2)
Total Protein: 5.3 g/dL — ABNORMAL LOW (ref 6.0–8.3)

## 2011-05-06 LAB — BODY FLUID CELL COUNT WITH DIFFERENTIAL
Eos, Fluid: 0 %
Monocyte-Macrophage-Serous Fluid: 24 % — ABNORMAL LOW (ref 50–90)
Total Nucleated Cell Count, Fluid: 1598 cu mm — ABNORMAL HIGH (ref 0–1000)

## 2011-05-06 LAB — LACTATE DEHYDROGENASE, PLEURAL OR PERITONEAL FLUID: LD, Fluid: 79 U/L — ABNORMAL HIGH (ref 3–23)

## 2011-05-06 LAB — ALBUMIN, FLUID (OTHER): Albumin, Fluid: 0.5 g/dL

## 2011-05-06 LAB — PROTEIN, BODY FLUID: Total protein, fluid: 1 g/dL

## 2011-05-06 LAB — GLUCOSE, PERITONEAL FLUID: Glucose, Peritoneal Fluid: 98 mg/dL

## 2011-05-06 LAB — MAGNESIUM: Magnesium: 1.5 mg/dL (ref 1.5–2.5)

## 2011-05-06 MED ORDER — SODIUM CHLORIDE 0.9 % IV SOLN
INTRAVENOUS | Status: DC
  Administered 2011-05-07 – 2011-05-08 (×2): via INTRAVENOUS
  Filled 2011-05-06 (×3): qty 50

## 2011-05-06 MED ORDER — LACTULOSE ENCEPHALOPATHY 10 GM/15ML PO SOLN: 30.0000 g | Freq: Three times a day (TID) | ORAL | Status: DC

## 2011-05-06 MED ORDER — LACTULOSE 10 GM/15ML PO SOLN
30.0000 g | Freq: Three times a day (TID) | ORAL | Status: DC
  Administered 2011-05-06 – 2011-05-08 (×7): 30 g via ORAL
  Filled 2011-05-06 (×12): qty 45

## 2011-05-06 MED ORDER — POTASSIUM CHLORIDE CRYS ER 20 MEQ PO TBCR
40.0000 meq | EXTENDED_RELEASE_TABLET | Freq: Once | ORAL | Status: AC
  Administered 2011-05-06: 40 meq via ORAL
  Filled 2011-05-06: qty 2

## 2011-05-06 MED ORDER — POLYETHYLENE GLYCOL 3350 17 G PO PACK
17.0000 g | PACK | Freq: Every day | ORAL | Status: DC
  Administered 2011-05-06: 17 g via ORAL
  Filled 2011-05-06 (×4): qty 1

## 2011-05-06 MED ORDER — MAGNESIUM OXIDE 400 MG PO TABS
200.0000 mg | ORAL_TABLET | Freq: Two times a day (BID) | ORAL | Status: DC
  Administered 2011-05-06 – 2011-05-08 (×5): 200 mg via ORAL
  Filled 2011-05-06 (×8): qty 0.5

## 2011-05-06 NOTE — Procedures (Signed)
Procedure : diagnostic paracentesis Specimen : 210 ML clear serous fluid Complications : none immediate  Fluid sent to lab for analysis as requested.

## 2011-05-06 NOTE — Progress Notes (Signed)
Subjective: Feeling better today, no abdominal pain. No headache today. Had some headache last night. No diarrhea.  Objective: Filed Vitals:   05/05/11 0541 05/05/11 1439 05/05/11 2231 05/06/11 0520  BP: 110/70 117/75 119/73 110/65  Pulse: 80 86 81 74  Temp: 99.3 F (37.4 C) 98.3 F (36.8 C) 98.8 F (37.1 C) 97.5 F (36.4 C)  TempSrc:  Oral Oral Oral  Resp: 21 18 24 20   Height:      Weight:      SpO2: 96% 100% 100% 98%   Weight change:   Intake/Output Summary (Last 24 hours) at 05/06/11 0847 Last data filed at 05/06/11 1610  Gross per 24 hour  Intake    658 ml  Output      0 ml  Net    658 ml    General: Alert, awake, oriented x3, in no acute distress.  HEENT: No bruits, no goiter.  Heart: Regular rate and rhythm, without murmurs, rubs, gallops.  Lungs:, bilateral air movement, Clear to auscultation.  Abdomen: Soft, nontender,, positive bowel sounds, mild distended.  Neuro: Grossly intact, nonfocal. Extremities: no edema.    Lab Results:  Basename 05/06/11 0340 05/05/11 0324 05/04/11 0335  NA 127* 130* --  K 3.1* 3.2* --  CL 101 103 --  CO2 18* 19 --  GLUCOSE 89 97 --  BUN 10 13 --  CREATININE 0.77 0.82 --  CALCIUM 7.5* 7.6* --  MG 1.5 -- 1.9  PHOS -- -- --    Basename 05/06/11 0340 05/03/11 1950  AST 45* 46*  ALT 21 27  ALKPHOS 89 114  BILITOT 1.4* 2.6*  PROT 5.3* 6.2  ALBUMIN 2.1* 2.7*    Basename 05/03/11 1950  LIPASE 21  AMYLASE --    Basename 05/06/11 0340 05/05/11 0324 05/03/11 1950  WBC 5.7 7.0 --  NEUTROABS -- -- 7.8*  HGB 7.5* 7.4* --  HCT 22.1* 22.1* --  MCV 78.6 79.8 --  PLT 60* 50* --   Basename 05/04/11 0335  TSH 1.288  T4TOTAL --  T3FREE --  THYROIDAB --    Basename 05/04/11 0335  VITAMINB12 1096*  FOLATE 12.8  FERRITIN 42  TIBC 273  IRON 18*  RETICCTPCT 2.6    Micro Results: Recent Results (from the past 240 hour(s))  CULTURE, BLOOD (ROUTINE X 2)     Status: Normal (Preliminary result)   Collection Time   05/03/11  8:50 PM      Component Value Range Status Comment   Specimen Description BLOOD LEFT AC   Final    Special Requests BOTTLES DRAWN AEROBIC AND ANAEROBIC 5 CC EACH   Final    Setup Time [id number]   Final    Culture     Final    Value:        BLOOD CULTURE RECEIVED NO GROWTH TO DATE CULTURE WILL BE HELD FOR 5 DAYS BEFORE ISSUING A FINAL NEGATIVE REPORT   Report Status PENDING   Incomplete   CULTURE, BLOOD (ROUTINE X 2)     Status: Normal (Preliminary result)   Collection Time   05/03/11  8:55 PM      Component Value Range Status Comment   Specimen Description BLOOD LEFT HAND   Final    Special Requests BOTTLES DRAWN AEROBIC AND ANAEROBIC 5 CC EACH   Final    Setup Time [id number]   Final    Culture     Final    Value:        BLOOD  CULTURE RECEIVED NO GROWTH TO DATE CULTURE WILL BE HELD FOR 5 DAYS BEFORE ISSUING A FINAL NEGATIVE REPORT   Report Status PENDING   Incomplete     Studies/Results: Ct Chest W Contrast  05/05/2011  *RADIOLOGY REPORT*  Clinical Data:  Fever of unknown origin, cirrhosis, ascites  CT CHEST, ABDOMEN AND PELVIS WITH CONTRAST  Technique:  Multidetector CT imaging of the chest, abdomen and pelvis was performed following the standard protocol during bolus administration of intravenous contrast.  Contrast: OMNIPAQUE IOHEXOL 300 MG/ML IV SOLN  Comparison:  None  CT CHEST  Findings:  Trace bilateral pleural effusions with associated lower lobe atelectasis.  The lungs are otherwise essentially clear.  No pneumothorax.  Visualized thyroid is unremarkable.  The heart is top normal in size.  No pericardial effusion.  No suspicious mediastinal, hilar, or axillary lymphadenopathy.  Visualized osseous structures are within normal limits.  IMPRESSION: Trace bilateral pleural effusions with associated lower lobe atelectasis.  CT ABDOMEN AND PELVIS  Findings:  Tiny hiatal hernia.  Cirrhotic configuration.  No focal hepatic lesions are seen. Perihepatic ascites.   Splenomegaly, measuring 18.7 cm in maximal dimension.  Pancreas and adrenal glands are within normal limits.  Cholelithiasis.  Mild pericholecystic fluid, nonspecific in the setting of ascites.  Kidneys are within normal limits.  No hydronephrosis.  No evidence of small bowel obstruction.  Mucosal wall thickening involving a long segment of the right colon (series 2/image 73).  Mild atherosclerotic calcifications of the aorta and branch vessels.  Gastroesophageal and perisplenic varices.  Moderate abdominopelvic ascites.  No suspicious abdominopelvic ascites.  Prostate is unremarkable.  Bladder is within normal limits.  Mild degenerative changes of the lumbar spine.  IMPRESSION: Cirrhosis with stigmata of portal hypertension including splenomegaly, gastroesophageal/perisplenic varices, and moderate abdominopelvic ascites.  Mucosal thickening involving a long segment of the right colon. While possibly exacerbated by ascites, this appearance is worrisome for infectious/inflammatory colitis.  Cholelithiasis.  Mild pericholecystic fluid, nonspecific in the setting of ascites.  Original Report Authenticated By: Charline Bills, M.D.   Ct Abdomen Pelvis W Contrast  05/05/2011  *RADIOLOGY REPORT*  Clinical Data:  Fever of unknown origin, cirrhosis, ascites  CT CHEST, ABDOMEN AND PELVIS WITH CONTRAST  Technique:  Multidetector CT imaging of the chest, abdomen and pelvis was performed following the standard protocol during bolus administration of intravenous contrast.  Contrast: OMNIPAQUE IOHEXOL 300 MG/ML IV SOLN  Comparison:  None  CT CHEST  Findings:  Trace bilateral pleural effusions with associated lower lobe atelectasis.  The lungs are otherwise essentially clear.  No pneumothorax.  Visualized thyroid is unremarkable.  The heart is top normal in size.  No pericardial effusion.  No suspicious mediastinal, hilar, or axillary lymphadenopathy.  Visualized osseous structures are within normal limits.  IMPRESSION:  Trace bilateral pleural effusions with associated lower lobe atelectasis.  CT ABDOMEN AND PELVIS  Findings:  Tiny hiatal hernia.  Cirrhotic configuration.  No focal hepatic lesions are seen. Perihepatic ascites.  Splenomegaly, measuring 18.7 cm in maximal dimension.  Pancreas and adrenal glands are within normal limits.  Cholelithiasis.  Mild pericholecystic fluid, nonspecific in the setting of ascites.  Kidneys are within normal limits.  No hydronephrosis.  No evidence of small bowel obstruction.  Mucosal wall thickening involving a long segment of the right colon (series 2/image 73).  Mild atherosclerotic calcifications of the aorta and branch vessels.  Gastroesophageal and perisplenic varices.  Moderate abdominopelvic ascites.  No suspicious abdominopelvic ascites.  Prostate is unremarkable.  Bladder  is within normal limits.  Mild degenerative changes of the lumbar spine.  IMPRESSION: Cirrhosis with stigmata of portal hypertension including splenomegaly, gastroesophageal/perisplenic varices, and moderate abdominopelvic ascites.  Mucosal thickening involving a long segment of the right colon. While possibly exacerbated by ascites, this appearance is worrisome for infectious/inflammatory colitis.  Cholelithiasis.  Mild pericholecystic fluid, nonspecific in the setting of ascites.  Original Report Authenticated By: Charline Bills, M.D.   US Paracentesis  05/04/2011  *RADIOLOGY REPORT*  Clinical Data: This is a pleasant 45 year old male with a history of cirrhosis and ascites who was sent for an ultrasound-guided therapeutic paracentesis.  ULTRASOUND GUIDED PARACENTESIS  Comparison:  None  An ultrasound guided paracentesis was thoroughly discussed with the patient and questions answered.  The benefits, risks, alternatives and complications were also discussed.  The patient understands and wishes to proceed with the procedure.  Written consent was obtained.  Ultrasound was performed to localize and mark an  adequate pocket of fluid in the right lower quadrant of the abdomen.  The area was then prepped and draped in the normal sterile fashion.  1% Lidocaine was used for local anesthesia.  Under ultrasound guidance a 19 gauge Yueh catheter was introduced.  Paracentesis was performed.  The catheter was removed and a dressing applied.  Complications:  None  Findings:  A total of approximately 1.1 liters of amber fluid was removed.  A fluid sample was not sent for laboratory analysis.  IMPRESSION: Successful ultrasound guided paracentesis yielding 1.1 liters of ascites.  Read by: Kinnie Scales. Rinehuls, P.A.-C  Original Report Authenticated By: Richarda Overlie, M.D.    Medications: I have reviewed the patient's current medications.   SBP (spontaneous bacterial peritonitis) / Fever   Continue with Ceftriaxone day 3. Blood culture no growth to date. Post US paracentesis, fluid was not send for culture and cell count. Ct abdomen/pelvis showed possible infectious colitis. Will follow ID recommendation. No diarrhea.   HIV DISEASE (06/23/2006) Continue with Atripla.   ANXIETY (06/23/2006) Continue with Xanax PRN.   DEPRESSION (06/23/2006) Continue with Amitriptyline.  HYPERTENSION (06/23/2006) Controlled.   Other ascites (11/13/2009) Post therapeutic paracentesis.   Alcoholic cirrhosis of liver (07/15/2010) Continue to hold  Lasix, spironolactone, post contrast. I will restart this medication 11-23.  Continue Lactulose. NSL IV fluids.   Hyponatremia (05/03/2011)  Likely secondary to cirrhosis. NSL IV fluids.  Hypokalemia (05/04/2011) Will replete with KCL 40 meq. I will replete Mg with 200 Mg bid.   Headache (05/04/2011); Improved.  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 Monitor. Stable. Anemia: Monitor Hb. No transfusion at this time. Continue with  ferrous sulfate.      LOS: 3 days   Taiya Nutting M.D. [phone] [facility] 05/06/2011, 8:47 AM

## 2011-05-06 NOTE — Progress Notes (Signed)
Subjective: Tired, sleepy  Objective: Weight change:   Intake/Output Summary (Last 24 hours) at 05/06/11 1346 Last data filed at 05/06/11 0828  Gross per 24 hour  Intake    418 ml  Output      0 ml  Net    418 ml   Blood pressure 116/75, pulse 83, temperature 98.5 F (36.9 C), temperature source Oral, resp. rate 18, height 5\' 9"  (1.753 m), weight 225 lb 15.5 oz (102.5 kg), SpO2 100.00%. Temp:  [97.5 F (36.4 C)-98.8 F (37.1 C)] 98.5 F (36.9 C) (11/22 1021) Pulse Rate:  [74-86] 83  (11/22 1021) Resp:  [18-24] 18  (11/22 1021) BP: (110-119)/(65-75) 116/75 mmHg (11/22 1021) SpO2:  [98 %-100 %] 100 % (11/22 1021)  Physical Exam: General: Alert and awake, oriented x3, not in any acute distress. HEENT: anicteric sclera, pupils reactive to light and accommodation, EOMI CVS regular rate, normal r,  no murmur rubs or gallops Chest: clear to auscultation bilaterally, no wheezing, rales or rhonchi Abdomen: softt, distended, dullness oto percussion on flanks, pos bowel sounds Extremities: no  clubbing or edema noted bilaterally Skin: no rashes  Neuro: nonfocal  Lab Results:  Basename 05/06/11 0340 05/05/11 0324  WBC 5.7 7.0  HGB 7.5* 7.4*  HCT 22.1* 22.1*  PLT 60* 50*   BMET  Basename 05/06/11 0340 05/05/11 0324  NA 127* 130*  K 3.1* 3.2*  CL 101 103  CO2 18* 19  GLUCOSE 89 97  BUN 10 13  CREATININE 0.77 0.82  CALCIUM 7.5* 7.6*    Micro Results: Recent Results (from the past 240 hour(s))  CULTURE, BLOOD (ROUTINE X 2)     Status: Normal (Preliminary result)   Collection Time   05/03/11  8:50 PM      Component Value Range Status Comment   Specimen Description BLOOD LEFT AC   Final    Special Requests BOTTLES DRAWN AEROBIC AND ANAEROBIC 5 CC EACH   Final    Setup Time [id number]   Final    Culture     Final    Value:        BLOOD CULTURE RECEIVED NO GROWTH TO DATE CULTURE WILL BE HELD FOR 5 DAYS BEFORE ISSUING A FINAL NEGATIVE REPORT   Report Status PENDING    Incomplete   CULTURE, BLOOD (ROUTINE X 2)     Status: Normal (Preliminary result)   Collection Time   05/03/11  8:55 PM      Component Value Range Status Comment   Specimen Description BLOOD LEFT HAND   Final    Special Requests BOTTLES DRAWN AEROBIC AND ANAEROBIC 5 CC EACH   Final    Setup Time [id number]   Final    Culture     Final    Value:        BLOOD CULTURE RECEIVED NO GROWTH TO DATE CULTURE WILL BE HELD FOR 5 DAYS BEFORE ISSUING A FINAL NEGATIVE REPORT   Report Status PENDING   Incomplete     Studies/Results: Dg Chest 2 View  05/03/2011  *RADIOLOGY REPORT*  Clinical Data: Pain, weakness  CHEST - 2 VIEW  Comparison: None.  Findings: Mild cardiac enlargement and vascular congestion. Basilar atelectasis noted.  Negative for CHF, definite pneumonia, effusion or pneumothorax.  Trachea midline.  IMPRESSION: Cardiomegaly without CHF.  Basilar atelectasis.  Original Report Authenticated By: Judie Petit. Ruel Favors, M.D.   Ct Head Wo Contrast  05/03/2011  *RADIOLOGY REPORT*  Clinical Data:  Fever, headaches  CT HEAD WITHOUT  CONTRAST  Technique:  Contiguous axial images were obtained from the base of the skull through the vertex without contrast  Comparison:  05/09/2010  Findings:  The brain has a normal appearance without evidence for hemorrhage, acute infarction, hydrocephalus, or mass lesion.  There is no extra axial fluid collection.  The skull and paranasal sinuses are normal.  IMPRESSION: Normal CT of the head without contrast.  Original Report Authenticated By: Judie Petit. Ruel Favors, M.D.   US Paracentesis  05/04/2011  *RADIOLOGY REPORT*  Clinical Data: This is a pleasant 45 year old male with a history of cirrhosis and ascites who was sent for an ultrasound-guided therapeutic paracentesis.  ULTRASOUND GUIDED PARACENTESIS  Comparison:  None  An ultrasound guided paracentesis was thoroughly discussed with the patient and questions answered.  The benefits, risks, alternatives and complications were  also discussed.  The patient understands and wishes to proceed with the procedure.  Written consent was obtained.  Ultrasound was performed to localize and mark an adequate pocket of fluid in the right lower quadrant of the abdomen.  The area was then prepped and draped in the normal sterile fashion.  1% Lidocaine was used for local anesthesia.  Under ultrasound guidance a 19 gauge Yueh catheter was introduced.  Paracentesis was performed.  The catheter was removed and a dressing applied.  Complications:  None  Findings:  A total of approximately 1.1 liters of amber fluid was removed.  A fluid sample was not sent for laboratory analysis.  IMPRESSION: Successful ultrasound guided paracentesis yielding 1.1 liters of ascites.  Read by: Kinnie Scales. Rinehuls, P.A.-C  Original Report Authenticated By: Richarda Overlie, M.D.    Antibiotics:  Anti-infectives     Start     Dose/Rate Route Frequency Ordered Stop   05/05/11 2200   metroNIDAZOLE (FLAGYL) tablet 500 mg        500 mg Oral 3 times per day 05/05/11 1914     05/05/11 1000   cefTRIAXone (ROCEPHIN) 1 g in dextrose 5 % 50 mL IVPB  Status:  Discontinued        1 g 100 mL/hr over 30 Minutes Intravenous Every 24 hours 05/04/11 0057 05/04/11 0730   05/04/11 2200  efavirenz-emtrictabine-tenofovir (ATRIPLA) 600-200-300 MG per tablet 1 tablet       1 tablet Oral Daily at bedtime 05/04/11 0024     05/04/11 1800   vancomycin (VANCOCIN) IVPB 1000 mg/200 mL premix  Status:  Discontinued        1,000 mg 200 mL/hr over 60 Minutes Intravenous Every 12 hours 05/04/11 0432 05/04/11 1140   05/04/11 1000   cefTRIAXone (ROCEPHIN) 1 g in dextrose 5 % 50 mL IVPB        1 g 100 mL/hr over 30 Minutes Intravenous Every 24 hours 05/04/11 0730     05/04/11 0045   vancomycin (VANCOCIN) 1,500 mg in sodium chloride 0.9 % 500 mL IVPB        1,500 mg 250 mL/hr over 120 Minutes Intravenous To Emergency Dept 05/04/11 0040 05/04/11 0529   05/04/11 0030   cefTRIAXone (ROCEPHIN) 1 g in  dextrose 5 % 50 mL IVPB  Status:  Discontinued        1 g 100 mL/hr over 30 Minutes Intravenous Every 24 hours 05/04/11 0024 05/04/11 0057   05/03/11 2200   cefTRIAXone (ROCEPHIN) 1 g in dextrose 5 % 50 mL IVPB        1 g 100 mL/hr over 30 Minutes Intravenous  Once 05/03/11 2151 05/03/11 2236  Medications: Scheduled Meds:    . amitriptyline  50 mg Oral QHS  . cefTRIAXone (ROCEPHIN) IV  1 g Intravenous Q24H  . efavirenz-emtrictabine-tenofovir  1 tablet Oral QHS  . ferrous sulfate  325 mg Oral BID WC  . lactulose (encephalopathy)  20 g Oral BID  . magnesium oxide  200 mg Oral BID  . metroNIDAZOLE  500 mg Oral Q8H  . pantoprazole  40 mg Oral Daily  . potassium chloride  40 mEq Oral Once  . DISCONTD: furosemide  40 mg Oral Daily  . DISCONTD: spironolactone  50 mg Oral Daily   Continuous Infusions:    . DISCONTD: sodium chloride 50 mL/hr at 05/05/11 2145   PRN Meds:.ALPRAZolam, iohexol, sodium chloride, zolpidem, DISCONTD: acetaminophen  Assessment/Plan: [name] is a 45 y.o. male with  Superbly contolled HIV, also with cirrhosis and ascites and FUO  1) SBP: WBC >1.5k, > 50% PMNs. CT scan showed ascites and ? Colonic edema (likely overcall it sounds) Unfortunately the 1L worth of fluid removed was NOT sent for cell count and differential, or cultures or other analyses. It was described as straw colored. I have NO way of knowing in this context if this man has SBP or not --continue rocephin and flagyl (added yesterday) (he is now at day 5 but still with quite a bit of WBC on paracentesis) --followup cultures --would consider repeat paracentesis in 48 hours --consider TMP/SMX vs quinlone daily for SBP prophylaxis in future   2) IP --DC droplet precautions  3) HIV perfectly controlled  4) Cirrhosis: pt asked for increase in his lactulose   LOS: 3 days   Acey Lav 05/06/2011, 1:46 PM

## 2011-05-07 LAB — BASIC METABOLIC PANEL
BUN: 8 mg/dL (ref 6–23)
Chloride: 109 mEq/L (ref 96–112)
GFR calc Af Amer: >90 mL/min (ref >90)
GFR calc non Af Amer: >90 mL/min (ref >90)
Potassium: 3.6 mEq/L (ref 3.5–5.1)
Sodium: 133 mEq/L — ABNORMAL LOW (ref 135–145)

## 2011-05-07 LAB — PROTIME-INR
INR: 1.86 — ABNORMAL HIGH (ref 0.00–1.49)
Prothrombin Time: 21.8 seconds — ABNORMAL HIGH (ref 11.6–15.2)

## 2011-05-07 LAB — CBC
HCT: 23.1 % — ABNORMAL LOW (ref 39.0–52.0)
MCH: 25.9 pg — ABNORMAL LOW (ref 26.0–34.0)
MCHC: 32 g/dL (ref 30.0–36.0)
Platelets: 60 10*3/uL — ABNORMAL LOW (ref 150–400)
RDW: 18.2 % — ABNORMAL HIGH (ref 11.5–15.5)
WBC: 3.6 10*3/uL — ABNORMAL LOW (ref 4.0–10.5)

## 2011-05-07 LAB — OSMOLALITY, URINE: Osmolality, Ur: 266 mOsm/kg — ABNORMAL LOW (ref 390–1090)

## 2011-05-07 LAB — PATHOLOGIST SMEAR REVIEW

## 2011-05-07 LAB — SODIUM, URINE, RANDOM: Sodium, Ur: 46 mEq/L

## 2011-05-07 MED ORDER — SPIRONOLACTONE 50 MG PO TABS
50.0000 mg | ORAL_TABLET | Freq: Two times a day (BID) | ORAL | Status: DC
  Administered 2011-05-07 – 2011-05-08 (×3): 50 mg via ORAL
  Filled 2011-05-07 (×6): qty 1

## 2011-05-07 NOTE — Progress Notes (Signed)
Subjective: Feeling better today. Headache resolved. No back pain. Had 4 BM yesterday. Denies abdominal pain.  Objective: Filed Vitals:   05/06/11 1355 05/06/11 1742 05/06/11 2145 05/07/11 0555  BP: 130/84 110/69 108/71 97/62  Pulse: 91 69 68 65  Temp: 97.7 F (36.5 C) 97.9 F (36.6 C) 98.5 F (36.9 C) 98 F (36.7 C)  TempSrc: Oral Oral Oral Oral  Resp: 18 18 18 16   Height:      Weight:      SpO2: 98% 100% 100% 98%   Weight change:   Intake/Output Summary (Last 24 hours) at 05/07/11 1119 Last data filed at 05/07/11 0900  Gross per 24 hour  Intake   1743 ml  Output      0 ml  Net   1743 ml    General: Alert, awake, oriented x3, in no acute distress.  HEENT: No bruits, no goiter.  Heart: Regular rate and rhythm, without murmurs, rubs, gallops.  Lungs bilateral air movement, no wheezes or crackles. .  Abdomen: Soft, nontender, nondistended, positive bowel sounds.  Neuro: Grossly intact, nonfocal. Extremities: no edema.   Lab Results:  [facility] 05/07/11 0340 05/06/11 0340  NA 133* 127*  K 3.6 3.1*  CL 109 101  CO2 19 18*  GLUCOSE 100* 89  BUN 8 10  CREATININE 0.73 0.77  CALCIUM 7.7* 7.5*  MG -- 1.5  PHOS -- --    Basename 05/06/11 0340  AST 45*  ALT 21  ALKPHOS 89  BILITOT 1.4*  PROT 5.3*  ALBUMIN 2.1*   No results found for this basename: LIPASE:2,AMYLASE:2 in the last 72 hours  Basename 05/07/11 0340 05/06/11 0340  WBC 3.6* 5.7  NEUTROABS -- --  HGB 7.4* 7.5*  HCT 23.1* 22.1*  MCV 80.8 78.6  PLT 60* 60*    Micro Results: Recent Results (from the past 240 hour(s))  CULTURE, BLOOD (ROUTINE X 2)     Status: Normal (Preliminary result)   Collection Time   05/03/11  8:50 PM      Component Value Range Status Comment   Specimen Description BLOOD LEFT AC   Final    Special Requests BOTTLES DRAWN AEROBIC AND ANAEROBIC 5 CC EACH   Final    Setup Time [id number]   Final    Culture     Final    Value:        BLOOD CULTURE RECEIVED NO GROWTH TO DATE  CULTURE WILL BE HELD FOR 5 DAYS BEFORE ISSUING A FINAL NEGATIVE REPORT   Report Status PENDING   Incomplete   CULTURE, BLOOD (ROUTINE X 2)     Status: Normal (Preliminary result)   Collection Time   05/03/11  8:55 PM      Component Value Range Status Comment   Specimen Description BLOOD LEFT HAND   Final    Special Requests BOTTLES DRAWN AEROBIC AND ANAEROBIC 5 CC EACH   Final    Setup Time [id number]   Final    Culture     Final    Value:        BLOOD CULTURE RECEIVED NO GROWTH TO DATE CULTURE WILL BE HELD FOR 5 DAYS BEFORE ISSUING A FINAL NEGATIVE REPORT   Report Status PENDING   Incomplete   BODY FLUID CULTURE     Status: Normal (Preliminary result)   Collection Time   05/06/11 11:16 AM      Component Value Range Status Comment   Specimen Description FLUID   Final    Special  Requests NONE   Final    Gram Stain     Final    Value: NO WBC SEEN     NO ORGANISMS SEEN   Culture NO GROWTH 1 DAY   Final    Report Status PENDING   Incomplete   FUNGUS CULTURE W SMEAR     Status: Normal (Preliminary result)   Collection Time   05/06/11 11:16 AM      Component Value Range Status Comment   Specimen Description FLUID   Final    Special Requests NONE   Final    Fungal Smear NO YEAST OR FUNGAL ELEMENTS SEEN   Final    Culture CULTURE IN PROGRESS FOR FOUR WEEKS   Final    Report Status PENDING   Incomplete     Studies/Results: Ct Chest W Contrast  05/05/2011  *RADIOLOGY REPORT*  Clinical Data:  Fever of unknown origin, cirrhosis, ascites  CT CHEST, ABDOMEN AND PELVIS WITH CONTRAST  Technique:  Multidetector CT imaging of the chest, abdomen and pelvis was performed following the standard protocol during bolus administration of intravenous contrast.  Contrast: OMNIPAQUE IOHEXOL 300 MG/ML IV SOLN  Comparison:  None  CT CHEST  Findings:  Trace bilateral pleural effusions with associated lower lobe atelectasis.  The lungs are otherwise essentially clear.  No pneumothorax.  Visualized  thyroid is unremarkable.  The heart is top normal in size.  No pericardial effusion.  No suspicious mediastinal, hilar, or axillary lymphadenopathy.  Visualized osseous structures are within normal limits.  IMPRESSION: Trace bilateral pleural effusions with associated lower lobe atelectasis.  CT ABDOMEN AND PELVIS  Findings:  Tiny hiatal hernia.  Cirrhotic configuration.  No focal hepatic lesions are seen. Perihepatic ascites.  Splenomegaly, measuring 18.7 cm in maximal dimension.  Pancreas and adrenal glands are within normal limits.  Cholelithiasis.  Mild pericholecystic fluid, nonspecific in the setting of ascites.  Kidneys are within normal limits.  No hydronephrosis.  No evidence of small bowel obstruction.  Mucosal wall thickening involving a long segment of the right colon (series 2/image 73).  Mild atherosclerotic calcifications of the aorta and branch vessels.  Gastroesophageal and perisplenic varices.  Moderate abdominopelvic ascites.  No suspicious abdominopelvic ascites.  Prostate is unremarkable.  Bladder is within normal limits.  Mild degenerative changes of the lumbar spine.  IMPRESSION: Cirrhosis with stigmata of portal hypertension including splenomegaly, gastroesophageal/perisplenic varices, and moderate abdominopelvic ascites.  Mucosal thickening involving a long segment of the right colon. While possibly exacerbated by ascites, this appearance is worrisome for infectious/inflammatory colitis.  Cholelithiasis.  Mild pericholecystic fluid, nonspecific in the setting of ascites.  Original Report Authenticated By: Charline Bills, M.D.   Ct Abdomen Pelvis W Contrast  05/05/2011  *RADIOLOGY REPORT*  Clinical Data:  Fever of unknown origin, cirrhosis, ascites  CT CHEST, ABDOMEN AND PELVIS WITH CONTRAST  Technique:  Multidetector CT imaging of the chest, abdomen and pelvis was performed following the standard protocol during bolus administration of intravenous contrast.  Contrast: OMNIPAQUE  IOHEXOL 300 MG/ML IV SOLN  Comparison:  None  CT CHEST  Findings:  Trace bilateral pleural effusions with associated lower lobe atelectasis.  The lungs are otherwise essentially clear.  No pneumothorax.  Visualized thyroid is unremarkable.  The heart is top normal in size.  No pericardial effusion.  No suspicious mediastinal, hilar, or axillary lymphadenopathy.  Visualized osseous structures are within normal limits.  IMPRESSION: Trace bilateral pleural effusions with associated lower lobe atelectasis.  CT ABDOMEN AND PELVIS  Findings:  Tiny hiatal hernia.  Cirrhotic configuration.  No focal hepatic lesions are seen. Perihepatic ascites.  Splenomegaly, measuring 18.7 cm in maximal dimension.  Pancreas and adrenal glands are within normal limits.  Cholelithiasis.  Mild pericholecystic fluid, nonspecific in the setting of ascites.  Kidneys are within normal limits.  No hydronephrosis.  No evidence of small bowel obstruction.  Mucosal wall thickening involving a long segment of the right colon (series 2/image 73).  Mild atherosclerotic calcifications of the aorta and branch vessels.  Gastroesophageal and perisplenic varices.  Moderate abdominopelvic ascites.  No suspicious abdominopelvic ascites.  Prostate is unremarkable.  Bladder is within normal limits.  Mild degenerative changes of the lumbar spine.  IMPRESSION: Cirrhosis with stigmata of portal hypertension including splenomegaly, gastroesophageal/perisplenic varices, and moderate abdominopelvic ascites.  Mucosal thickening involving a long segment of the right colon. While possibly exacerbated by ascites, this appearance is worrisome for infectious/inflammatory colitis.  Cholelithiasis.  Mild pericholecystic fluid, nonspecific in the setting of ascites.  Original Report Authenticated By: Charline Bills, M.D.   US Paracentesis  05/07/2011  *RADIOLOGY REPORT*  Clinical Data: History of alcohol abuse with ascites of uncertain etiology.  Request has been made  for diagnostic paracentesis.  ULTRASOUND GUIDED PARACENTESIS  Comparison:  Paracentesis performed on 05/05/2011.  An ultrasound guided paracentesis was thoroughly discussed with the patient and questions answered.  The benefits, risks, alternatives and complications were also discussed.  The patient understands and wishes to proceed with the procedure.  Written consent was obtained.  Ultrasound was performed to localize and mark an adequate pocket of fluid in the right lower quadrant of the abdomen.  The area was then prepped and draped in the normal sterile fashion.  1% Lidocaine was used for local anesthesia.  Under ultrasound guidance a 19 gauge Yueh catheter was introduced.  Paracentesis was performed.  The catheter was removed and a dressing applied.  Complications:  None immediate  Findings:  A total of approximately 210 ml of yellow serous fluid was removed.  A fluid sample was sent for laboratory analysis.  IMPRESSION: Successful ultrasound guided paracentesis yielding 210 ml of ascites.  Read by: Anselm Pancoast, P.A.-C  Original Report Authenticated By: Donavan Burnet, M.D.    Medications: I have reviewed the patient's current medications.   Patient Active Hospital Problem List:    SBP (spontaneous bacterial peritonitis) / Fever  Continue with Ceftriaxone day 4, Flagyl day 3. Blood culture no growth to date. Post US paracentesis, fluid was not send for culture and cell count. Ct abdomen/pelvis showed possible infectious colitis, ID not impressed by colitis. Continue treatment for SBP. Might need repeat paracentesis per ID.  Disposition for discharge home: will wait for ID recommendation.  HIV DISEASE (06/23/2006) Continue with Atripla.   ANXIETY (06/23/2006) Continue with Xanax PRN.   DEPRESSION (06/23/2006) Continue with Amitriptyline.  HYPERTENSION (06/23/2006) Controlled.   Other ascites (11/13/2009) Post therapeutic paracentesis. Will start spironolactone.  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 I will restart spironolactone today. Continue Lactulose. NSL IV fluids. I will change diet to regular. Fluids restriction to 1.5l. INR at 1.8.  Hyponatremia (05/03/2011) Likely secondary to cirrhosis. NSL IV fluids. Improved.   Hypokalemia (05/04/2011) Repleted. Will repeat mg level in am.   Headache (05/04/2011); Resolved.  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 Monitor. Stable.  Anemia: Monitor Hb. No transfusion at this time. Continue with ferrous sulfate.      LOS: 4 days   Gioia Ranes M.D.  [facility] 05/07/2011, 11:19 AM

## 2011-05-08 LAB — CBC
MCH: 26.1 pg (ref 26.0–34.0)
MCHC: 32.3 g/dL (ref 30.0–36.0)
MCV: 80.6 fL (ref 78.0–100.0)
Platelets: 77 10*3/uL — ABNORMAL LOW (ref 150–400)
RBC: 2.84 MIL/uL — ABNORMAL LOW (ref 4.22–5.81)
RDW: 18.5 % — ABNORMAL HIGH (ref 11.5–15.5)

## 2011-05-08 LAB — BASIC METABOLIC PANEL
BUN: 7 mg/dL (ref 6–23)
CO2: 18 mEq/L — ABNORMAL LOW (ref 19–32)
Calcium: 7.6 mg/dL — ABNORMAL LOW (ref 8.4–10.5)
Chloride: 109 mEq/L (ref 96–112)
Creatinine, Ser: 0.69 mg/dL (ref 0.50–1.35)
Glucose, Bld: 95 mg/dL (ref 70–99)
Sodium: 134 mEq/L — ABNORMAL LOW (ref 135–145)

## 2011-05-08 LAB — MAGNESIUM: Magnesium: 1.8 mg/dL (ref 1.5–2.5)

## 2011-05-08 MED ORDER — METRONIDAZOLE 500 MG PO TABS: 500.0000 mg | ORAL_TABLET | Freq: Three times a day (TID) | ORAL | Status: AC

## 2011-05-08 MED ORDER — LEVOFLOXACIN 750 MG PO TABS: 750.0000 mg | ORAL_TABLET | Freq: Every day | ORAL | Status: AC

## 2011-05-08 MED ORDER — POTASSIUM CHLORIDE CRYS ER 20 MEQ PO TBCR
40.0000 meq | EXTENDED_RELEASE_TABLET | Freq: Once | ORAL | Status: AC
  Administered 2011-05-08: 40 meq via ORAL
  Filled 2011-05-08: qty 2

## 2011-05-08 MED ORDER — FERROUS SULFATE 325 (65 FE) MG PO TABS: 325.0000 mg | ORAL_TABLET | Freq: Two times a day (BID) | ORAL | Status: DC

## 2011-05-08 NOTE — Discharge Summary (Signed)
[name] MRN: [id number] DOB/AGE: 46-Aug-1967 46 y.o.  Admit date: 05/03/2011 Discharge date: 05/08/2011  Primary Care Physician:  Carl Curia, MD   Discharge Diagnoses:     Active Hospital Problems  Diagnoses Date Noted   . SBP (spontaneous bacterial peritonitis) 05/03/2011   . Hypokalemia 05/04/2011   . Headache Resolved 05/04/2011   . Hyponatremia, Improved 05/03/2011   . Alcoholic cirrhosis of liver 07/15/2010   . Other ascites 11/13/2009   . 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 HIV DISEASE 06/23/2006   . HYPERLIPIDEMIA 06/23/2006   . ANXIETY 06/23/2006   . DEPRESSION 06/23/2006   . HYPERTENSION 06/23/2006              DISCHARGE MEDICATION: Current Discharge Medication List    START taking these medications   Details  ferrous sulfate 325 (65 FE) MG tablet Take 1 tablet (325 mg total) by mouth 2 (two) times daily with a meal. Qty: 60 tablet, Refills: 0    levofloxacin (LEVAQUIN) 750 MG tablet Take 1 tablet (750 mg total) by mouth daily. Qty: 10 tablet, Refills: 0    metroNIDAZOLE (FLAGYL) 500 MG tablet Take 1 tablet (500 mg total) by mouth every 8 (eight) hours. Qty: 18 tablet, Refills: 0      CONTINUE these medications which have NOT CHANGED   Details  ALPRAZolam (XANAX) 0.5 MG tablet Take 0.5 mg by mouth every 8 (eight) hours as needed. For anxiety.     amitriptyline (ELAVIL) 25 MG tablet Take 50 mg by mouth at bedtime.      efavirenz-emtrictabine-tenofovir (ATRIPLA) 600-200-300 MG per tablet Take 1 tablet by mouth at bedtime. Qty: 30 tablet, Refills: 11   Associated Diagnoses: Human immunodeficiency virus (HIV) disease    esomeprazole (NEXIUM) 40 MG capsule Take 1 capsule (40 mg total) by mouth daily. Qty: 30 capsule, Refills: 0   Associated Diagnoses: Upper GI bleed    furosemide (LASIX) 40 MG tablet Take 1 tablet (40 mg total) by mouth daily. Qty: 30 tablet, Refills: 5   Associated Diagnoses: Alcoholic cirrhosis of  liver    GENERLAC 10 GM/15ML SOLN TAKE TWO TABLESPOONFULS(30ML) BY MOUTH 4 TIMES DAILY Qty: 1892 mL, Refills: 2    spironolactone (ALDACTONE) 50 MG tablet Take 50 mg by mouth 2 (two) times daily.        STOP taking these medications     oseltamivir (TAMIFLU) 75 MG capsule            Consults: Treatment Team:  Novlet Jarrett-Davis Acey Lav, MD   SIGNIFICANT DIAGNOSTIC STUDIES:  Dg Chest 2 View  05/03/2011  *RADIOLOGY REPORT*  Clinical Data: Pain, weakness  CHEST - 2 VIEW  Comparison: None.  Findings: Mild cardiac enlargement and vascular congestion. Basilar atelectasis noted.  Negative for CHF, definite pneumonia, effusion or pneumothorax.  Trachea midline.  IMPRESSION: Cardiomegaly without CHF.  Basilar atelectasis.  Original Report Authenticated By: Judie Petit. Ruel Favors, M.D.   Ct Head Wo Contrast  05/03/2011  *RADIOLOGY REPORT*  Clinical Data:  Fever, headaches  CT HEAD WITHOUT CONTRAST  Technique:  Contiguous axial images were obtained from the base of the skull through the vertex without contrast  Comparison:  05/09/2010  Findings:  The brain has a normal appearance without evidence for hemorrhage, acute infarction, hydrocephalus, or mass lesion.  There is no extra axial fluid collection.  The skull and paranasal sinuses are normal.  IMPRESSION: Normal CT of the head without contrast.  Original Report Authenticated By: Judie Petit. TREVOR  Miles Costain, M.D.   Ct Chest W Contrast  05/05/2011  *RADIOLOGY REPORT*  Clinical Data:  Fever of unknown origin, cirrhosis, ascites  CT CHEST, ABDOMEN AND PELVIS WITH CONTRAST  Technique:  Multidetector CT imaging of the chest, abdomen and pelvis was performed following the standard protocol during bolus administration of intravenous contrast.  Contrast: OMNIPAQUE IOHEXOL 300 MG/ML IV SOLN  Comparison:  None  CT CHEST  Findings:  Trace bilateral pleural effusions with associated lower lobe atelectasis.  The lungs are otherwise essentially clear.  No  pneumothorax.  Visualized thyroid is unremarkable.  The heart is top normal in size.  No pericardial effusion.  No suspicious mediastinal, hilar, or axillary lymphadenopathy.  Visualized osseous structures are within normal limits.  IMPRESSION: Trace bilateral pleural effusions with associated lower lobe atelectasis.  CT ABDOMEN AND PELVIS  Findings:  Tiny hiatal hernia.  Cirrhotic configuration.  No focal hepatic lesions are seen. Perihepatic ascites.  Splenomegaly, measuring 18.7 cm in maximal dimension.  Pancreas and adrenal glands are within normal limits.  Cholelithiasis.  Mild pericholecystic fluid, nonspecific in the setting of ascites.  Kidneys are within normal limits.  No hydronephrosis.  No evidence of small bowel obstruction.  Mucosal wall thickening involving a long segment of the right colon (series 2/image 73).  Mild atherosclerotic calcifications of the aorta and branch vessels.  Gastroesophageal and perisplenic varices.  Moderate abdominopelvic ascites.  No suspicious abdominopelvic ascites.  Prostate is unremarkable.  Bladder is within normal limits.  Mild degenerative changes of the lumbar spine.  IMPRESSION: Cirrhosis with stigmata of portal hypertension including splenomegaly, gastroesophageal/perisplenic varices, and moderate abdominopelvic ascites.  Mucosal thickening involving a long segment of the right colon. While possibly exacerbated by ascites, this appearance is worrisome for infectious/inflammatory colitis.  Cholelithiasis.  Mild pericholecystic fluid, nonspecific in the setting of ascites.  Original Report Authenticated By: Charline Bills, M.D.   Ct Abdomen Pelvis W Contrast  05/05/2011  *RADIOLOGY REPORT*  Clinical Data:  Fever of unknown origin, cirrhosis, ascites  CT CHEST, ABDOMEN AND PELVIS WITH CONTRAST  Technique:  Multidetector CT imaging of the chest, abdomen and pelvis was performed following the standard protocol during bolus administration of intravenous contrast.   Contrast: OMNIPAQUE IOHEXOL 300 MG/ML IV SOLN  Comparison:  None  CT CHEST  Findings:  Trace bilateral pleural effusions with associated lower lobe atelectasis.  The lungs are otherwise essentially clear.  No pneumothorax.  Visualized thyroid is unremarkable.  The heart is top normal in size.  No pericardial effusion.  No suspicious mediastinal, hilar, or axillary lymphadenopathy.  Visualized osseous structures are within normal limits.  IMPRESSION: Trace bilateral pleural effusions with associated lower lobe atelectasis.  CT ABDOMEN AND PELVIS  Findings:  Tiny hiatal hernia.  Cirrhotic configuration.  No focal hepatic lesions are seen. Perihepatic ascites.  Splenomegaly, measuring 18.7 cm in maximal dimension.  Pancreas and adrenal glands are within normal limits.  Cholelithiasis.  Mild pericholecystic fluid, nonspecific in the setting of ascites.  Kidneys are within normal limits.  No hydronephrosis.  No evidence of small bowel obstruction.  Mucosal wall thickening involving a long segment of the right colon (series 2/image 73).  Mild atherosclerotic calcifications of the aorta and branch vessels.  Gastroesophageal and perisplenic varices.  Moderate abdominopelvic ascites.  No suspicious abdominopelvic ascites.  Prostate is unremarkable.  Bladder is within normal limits.  Mild degenerative changes of the lumbar spine.  IMPRESSION: Cirrhosis with stigmata of portal hypertension including splenomegaly, gastroesophageal/perisplenic varices, and moderate abdominopelvic ascites.  Mucosal thickening involving a long segment of the right colon. While possibly exacerbated by ascites, this appearance is worrisome for infectious/inflammatory colitis.  Cholelithiasis.  Mild pericholecystic fluid, nonspecific in the setting of ascites.  Original Report Authenticated By: Charline Bills, M.D.   US Paracentesis  05/07/2011  *RADIOLOGY REPORT*  Clinical Data: History of alcohol abuse with ascites of uncertain  etiology.  Request has been made for diagnostic paracentesis.  ULTRASOUND GUIDED PARACENTESIS  Comparison:  Paracentesis performed on 05/05/2011.  An ultrasound guided paracentesis was thoroughly discussed with the patient and questions answered.  The benefits, risks, alternatives and complications were also discussed.  The patient understands and wishes to proceed with the procedure.  Written consent was obtained.  Ultrasound was performed to localize and mark an adequate pocket of fluid in the right lower quadrant of the abdomen.  The area was then prepped and draped in the normal sterile fashion.  1% Lidocaine was used for local anesthesia.  Under ultrasound guidance a 19 gauge Yueh catheter was introduced.  Paracentesis was performed.  The catheter was removed and a dressing applied.  Complications:  None immediate  Findings:  A total of approximately 210 ml of yellow serous fluid was removed.  A fluid sample was sent for laboratory analysis.  IMPRESSION: Successful ultrasound guided paracentesis yielding 210 ml of ascites.  Read by: Anselm Pancoast, P.A.-C  Original Report Authenticated By: Donavan Burnet, M.D.   US Paracentesis  05/04/2011  *RADIOLOGY REPORT*  Clinical Data: This is a pleasant 45 year old male with a history of cirrhosis and ascites who was sent for an ultrasound-guided therapeutic paracentesis.  ULTRASOUND GUIDED PARACENTESIS  Comparison:  None  An ultrasound guided paracentesis was thoroughly discussed with the patient and questions answered.  The benefits, risks, alternatives and complications were also discussed.  The patient understands and wishes to proceed with the procedure.  Written consent was obtained.  Ultrasound was performed to localize and mark an adequate pocket of fluid in the right lower quadrant of the abdomen.  The area was then prepped and draped in the normal sterile fashion.  1% Lidocaine was used for local anesthesia.  Under ultrasound guidance a 19 gauge Yueh  catheter was introduced.  Paracentesis was performed.  The catheter was removed and a dressing applied.  Complications:  None  Findings:  A total of approximately 1.1 liters of amber fluid was removed.  A fluid sample was not sent for laboratory analysis.  IMPRESSION: Successful ultrasound guided paracentesis yielding 1.1 liters of ascites.  Read by: Kinnie Scales. Rinehuls, P.A.-C  Original Report Authenticated By: Richarda Overlie, M.D.     Recent Results (from the past 240 hour(s))  CULTURE, BLOOD (ROUTINE X 2)     Status: Normal (Preliminary result)   Collection Time   05/03/11  8:50 PM      Component Value Range Status Comment   Specimen Description BLOOD LEFT AC   Final    Special Requests BOTTLES DRAWN AEROBIC AND ANAEROBIC 5 CC EACH   Final    Setup Time [id number]   Final    Culture     Final    Value:        BLOOD CULTURE RECEIVED NO GROWTH TO DATE CULTURE WILL BE HELD FOR 5 DAYS BEFORE ISSUING A FINAL NEGATIVE REPORT   Report Status PENDING   Incomplete   CULTURE, BLOOD (ROUTINE X 2)     Status: Normal (Preliminary result)   Collection Time   05/03/11  8:55 PM  Component Value Range Status Comment   Specimen Description BLOOD LEFT HAND   Final    Special Requests BOTTLES DRAWN AEROBIC AND ANAEROBIC 5 CC EACH   Final    Setup Time [id number]   Final    Culture     Final    Value:        BLOOD CULTURE RECEIVED NO GROWTH TO DATE CULTURE WILL BE HELD FOR 5 DAYS BEFORE ISSUING A FINAL NEGATIVE REPORT   Report Status PENDING   Incomplete   BODY FLUID CULTURE     Status: Normal (Preliminary result)   Collection Time   05/06/11 11:16 AM      Component Value Range Status Comment   Specimen Description FLUID   Final    Special Requests NONE   Final    Gram Stain     Final    Value: NO WBC SEEN     NO ORGANISMS SEEN   Culture NO GROWTH 2 DAYS   Final    Report Status PENDING   Incomplete   AFB CULTURE WITH SMEAR     Status: Normal (Preliminary result)   Collection Time   05/06/11  11:16 AM      Component Value Range Status Comment   Specimen Description PERITONEAL   Final    Special Requests NONE   Final    ACID FAST SMEAR NO ACID FAST BACILLI SEEN   Final    Culture     Final    Value: CULTURE WILL BE EXAMINED FOR 6 WEEKS BEFORE ISSUING A FINAL REPORT   Report Status PENDING   Incomplete   FUNGUS CULTURE W SMEAR     Status: Normal (Preliminary result)   Collection Time   05/06/11 11:16 AM      Component Value Range Status Comment   Specimen Description FLUID   Final    Special Requests NONE   Final    Fungal Smear NO YEAST OR FUNGAL ELEMENTS SEEN   Final    Culture CULTURE IN PROGRESS FOR FOUR WEEKS   Final    Report Status PENDING   Incomplete   PATHOLOGIST SMEAR REVIEW     Status: Normal   Collection Time   05/06/11 11:16 AM      Component Value Range Status Comment   Tech Review Reviewed by H. Hollice Espy, M.D.   Final     BRIEF ADMITTING H & P: This is a 44 year old gentleman with known history of HIV, who states that he saw his doctor last Thursday at that time he was feeling achy. By the time he got home he was having shaking chills and a temperature of 104. He did report myalgias. He saw his PCP Dr. Nedra [name] and was prescribed Tamiflu for a likely viral illness. Patient states he did not take the Tamiflu. He continued to feel ill and weak. In the course of 4 days he gained over 10 pounds. His abdominal girth became quite distended and he developed trace swelling on his legs, no scrotal edema. He is on Lasix, he took an extra 40 mg a few days ago. He also developed a worsening abdominal pain. His appetite has been poor, he is not eating because he felt so bloated. He states he's drank lots of water because he feels dehydrated. In addition to not taking the Tamiflu, he is also not taking his nighttime anti-retroviral medications since Thursday. He also reports a headache since Monday. He does not have a history of migraines. He  reports no change in mental status. No  blurred vision, no tinnitus. He states he is generally achy and has joint aches. He additionally complains of back pain that shoots down both legs. There is no evidence of GI bleeding, he states he has brown stool. In the ER patient was febrile and diaphoretic.  Dr. Leone Payor saw patient in the office today and recommended hospitalization, his concern was SBP, arrangements were made for direct admission by [facility] hospitalist however no beds were available, patient was sent to the ER. Dr. Daiva Eves infectious disease cross cover for Dr Luciana Axe was contacted by ER physician, he agrees with current treatment for SBP.    Hospital Course:  1-SBP:  Patient was admitted with fever and abdominal pain. The concern was for SBP. He was started on ceftriaxone, he received a total of 5 days of IV antibiotics, and 4 days of Flagyl. He had therapeutic paracentesis, they removed 1 L on November 20. Unfortunately fluid was not sent for culture. He had a repeated paracentesis on November 23 , he had removed 200 mL. This time it was sent for cell count at 1598. The plan was supposed to  repeat  an ultrasound paracentesis in 48 hour but patient wants to go home. Patient will be discharged on Levaquin for a total of 10 days and on Flagyl or to complete 10 days of treatment. He will need further prescription for SBP prophylaxis with Bactrim or Quinolone. He will follow with Dr. Luciana Axe this next Thursday. Patient had CT abdomen pelvis and chest for workup for fever of unknown origen. CT show some mild colitis, infectious disease thought that this was overcall. Patient would need to follow up with GI.  2-Anemia: Hemoglobin has remained stable. No evidence of active GI bleed. He will  be discharged on ferrous sulfate. He would need to follow up with his Gastroenterologist.   3-HIV DISEASE (06/23/2006) Continue with Atripla.    4-Alcoholic cirrhosis of liver: Patient will be discharged on Lasix and  Spironolactone. He will need BMET to  follow  renal function and potassium level. Hyponatremia secondary to liver failure and increased volume this has improved. Continue with Lactulose. Patient Lasix and his spironolactone was on hold for one day because patient received contrast and we wanted to avoid contrast nephropathy.   In the date of discharge patient was in improved, I recommended to him to stay one or 2 more days but he wishes to go.       Discharge Orders    Future Appointments: Provider: Department: Dept Phone: Center:   05/13/2011 10:45 AM Staci Righter, MD [facility] [id number] [facility]   05/19/2011 2:15 PM Iva Boop, MD [facility] [phone] [facility]     Future Orders Please Complete By Expires   Diet general      Increase activity slowly        Follow-up Information    Follow up with Stan Head, MD on 05/19/2011. (at  2:15 pm, Follow up with Dr Luciana Axe )    Contact information:   [address] [facility] [address] [place] [address] [place] [address] [phone]           DISCHARGE EXAM:  He appears well, alert and oriented x 3, pleasant and cooperative. Vitals as noted. ENT normal, neck supple and free of adenopathy, or masses.  No thyromegaly or carotid bruits. Cranial nerves and fundi normal. Lungs are clear to auscultation. Heart sounds are normal, no murmurs,  clicks, gallops or rubs. Abdomen is soft, no tenderness, masses or  Extremities, peripheral pulses and reflexes are normal.. Screening neurological exam is normal without focal findings. Skin is normal without suspicious lesions.   Blood pressure 103/67, pulse 69, temperature 98 F (36.7 C), temperature source Oral, resp. rate 18, height 5\' 9"  (1.753 m), weight 102.5 kg (225 lb 15.5 oz), SpO2 99.00%.   Basename 05/08/11 0355 05/07/11 0340 05/06/11 0340  NA 134* 133* --  K 3.4* 3.6 --  CL 109 109 --  CO2 18* 19 --  GLUCOSE 95 100* --  BUN 7 8 --  CREATININE 0.69 0.73 --  CALCIUM 7.6* 7.7* --  MG 1.8 -- 1.5    PHOS -- -- --    Basename 05/06/11 0340  AST 45*  ALT 21  ALKPHOS 89  BILITOT 1.4*  PROT 5.3*  ALBUMIN 2.1*   Basename 05/08/11 0355 05/07/11 0340  WBC 3.7* 3.6*  NEUTROABS -- --  HGB 7.4* 7.4*  HCT 22.9* 23.1*  MCV 80.6 80.8  PLT 77* 60*    Signed: Kalleigh Harbor M.D. 05/08/2011, 3:46 PM

## 2011-05-10 ENCOUNTER — Other Ambulatory Visit: Payer: Self-pay | Admitting: Gastroenterology

## 2011-05-10 DIAGNOSIS — K922 Gastrointestinal hemorrhage, unspecified: Secondary | ICD-10-CM

## 2011-05-10 LAB — CULTURE, BLOOD (ROUTINE X 2)
Culture  Setup Time: 201211200228
Culture  Setup Time: 201211200228
Culture: NO GROWTH
Culture: NO GROWTH

## 2011-05-10 LAB — BODY FLUID CULTURE: Culture: NO GROWTH

## 2011-05-10 MED ORDER — ESOMEPRAZOLE MAGNESIUM 40 MG PO CPDR: 40.0000 mg | DELAYED_RELEASE_CAPSULE | Freq: Every day | ORAL | Status: DC

## 2011-05-10 NOTE — Telephone Encounter (Signed)
Nexium refilled #30 with 11 refills.

## 2011-05-11 ENCOUNTER — Telehealth: Payer: Self-pay | Admitting: Gastroenterology

## 2011-05-11 NOTE — Telephone Encounter (Signed)
Called [phone] to get Nexium approved. Medication was approved from 05/11/2011 - 05/10/2012. Pharmacy informed.

## 2011-05-13 ENCOUNTER — Encounter: Payer: Self-pay | Admitting: Internal Medicine

## 2011-05-13 ENCOUNTER — Ambulatory Visit (INDEPENDENT_AMBULATORY_CARE_PROVIDER_SITE_OTHER): Payer: Medicaid Other | Admitting: Internal Medicine

## 2011-05-13 VITALS — BP 142/88 | HR 86 | Ht 69.0 in | Wt 216.0 lb

## 2011-05-13 DIAGNOSIS — F411 Generalized anxiety disorder: Secondary | ICD-10-CM

## 2011-05-13 DIAGNOSIS — K652 Spontaneous bacterial peritonitis: Secondary | ICD-10-CM

## 2011-05-13 DIAGNOSIS — B20 Human immunodeficiency virus [HIV] disease: Secondary | ICD-10-CM

## 2011-05-13 MED ORDER — SULFAMETHOXAZOLE-TRIMETHOPRIM 800-160 MG PO TABS: 1.0000 | ORAL_TABLET | Freq: Every day | ORAL | Status: DC

## 2011-05-13 MED ORDER — ALPRAZOLAM 0.5 MG PO TABS: 0.5000 mg | ORAL_TABLET | Freq: Every evening | ORAL | Status: DC | PRN

## 2011-05-13 NOTE — Patient Instructions (Signed)
Followup with Dr. Gessner. 

## 2011-05-13 NOTE — Assessment & Plan Note (Signed)
I did refill his Xanax for anxiety.

## 2011-05-13 NOTE — Progress Notes (Signed)
  Subjective:    Patient ID: [name], male    DOB: 01-Mar-1966, 45 y.o.   MRN: [id number]  HPIthis patient comes in for routine followup as well as hospital followup. In regards to his HIV he continues to take his medication well and has had no missed doses and doesn't endorse is good tolerance. He otherwise has no complaints. However recently, he was having fever and chills and he was seen by gastroenterology who was concerned with spontaneous bacterial peritonitis and he was sent in for the evaluation and was admitted. He did have paracentesis that unfortunately was not sent for culture. It was though notable for increased WBCs. The patient therefore was treated empirically for spontaneous bacterial peritonitis and today comes in feeling much improved with no fever or chills and his ascites is stable. He continues to take all his medicines well and he has finished his course of antibiotics.    Review of Systems  Constitutional: Positive for fatigue. Negative for fever, chills and unexpected weight change.  HENT: Negative for sore throat and trouble swallowing.   Respiratory: Negative for cough and shortness of breath.   Cardiovascular: Negative for leg swelling.  Gastrointestinal: Negative for nausea, abdominal pain, diarrhea and abdominal distention.  Genitourinary: Negative for urgency and frequency.  Musculoskeletal: Negative for myalgias and arthralgias.  Skin: Negative for rash.  Neurological: Negative for headaches.  Hematological: Negative for adenopathy.  Psychiatric/Behavioral: Negative for dysphoric mood.       Objective:   Physical Exam  Constitutional: He is oriented to person, place, and time. He appears well-developed and well-nourished. No distress.  HENT:  Mouth/Throat: Oropharynx is clear and moist. No oropharyngeal exudate.  Eyes: No scleral icterus.  Cardiovascular: Normal rate, regular rhythm and normal heart sounds.  Exam reveals no gallop and no friction rub.    No murmur heard. Pulmonary/Chest: Effort normal and breath sounds normal. He has no wheezes. He has no rales.  Abdominal: Soft. Bowel sounds are normal. He exhibits no distension. There is no tenderness.  Lymphadenopathy:    He has no cervical adenopathy.  Neurological: He is alert and oriented to person, place, and time.  Skin: Skin is warm and dry. No rash noted.  Psychiatric: He has a normal mood and affect. His behavior is normal.          Assessment & Plan:

## 2011-05-13 NOTE — Assessment & Plan Note (Addendum)
He appears to have improved since his hospitalization. He has had no significant reaccumulation of fluid and his weight is at its usual level. I am concerned of recurrence of peritonitis and therefore I have discussed with him the option of taking secondary prophylaxis. The patient is in agreement with this and he also will discuss it with his gastroenterologist however at this time, I have prescribed him Bactrim to take daily. He also has a follow up with gastroenterology for further management. I did remind him of the absolute need to abstain from alcohol.  He did bring up questions about liver transplant. I did answer the best I could and though did defer further conversation of transplant with his gastroenterologist. He does understand the need to abstain from alcohol for any consideration of liver transplant in.

## 2011-05-13 NOTE — Assessment & Plan Note (Signed)
He continues to do well with his HIV and has no current issues. He will return in 4 months for rate routine followup. He was given condoms and he was reminded of the need to use condoms for all sexual activity.

## 2011-05-14 ENCOUNTER — Telehealth: Payer: Self-pay | Admitting: Internal Medicine

## 2011-05-14 ENCOUNTER — Other Ambulatory Visit: Payer: Self-pay | Admitting: Gastroenterology

## 2011-05-14 MED ORDER — SPIRONOLACTONE 50 MG PO TABS: 50.0000 mg | ORAL_TABLET | Freq: Two times a day (BID) | ORAL | Status: DC

## 2011-05-14 NOTE — Telephone Encounter (Signed)
Med refilled.

## 2011-05-14 NOTE — Telephone Encounter (Signed)
FYI

## 2011-05-17 ENCOUNTER — Telehealth: Payer: Self-pay | Admitting: *Deleted

## 2011-05-17 NOTE — Telephone Encounter (Signed)
The previous prescription from Dr. Maurice March was for 30 pills per month so I refilled that.  If he is having that much anxiety, I think mental health would be of benefit and they can decide if he would benefit from more frequent Xanax dosing.

## 2011-05-17 NOTE — Telephone Encounter (Signed)
He stated he did not get his xanax. I checked & it had been sent by Dr. Luciana [facility]xe to [facility]. Pt had been checking for it at [facility] in [place]. I offered to change pharmacies. He told me no, he could go to [facility] Having a hard time with his brother's problems & his. I encouraged him to call back if there was a problem. He has 20 tabs left.   Has been taking 2-3 per day. Told him the rx was for 1 per day . Told him I will send this to his md & see if he will increase the # he gets

## 2011-05-17 NOTE — Telephone Encounter (Signed)
rec'd fax fromhis pharmacy for xanax asking for this but it had been given by another md. I lm asking the pharmacist to call me

## 2011-05-18 ENCOUNTER — Telehealth: Payer: Self-pay | Admitting: *Deleted

## 2011-05-18 NOTE — Telephone Encounter (Signed)
I checked with [facility] in [place] [phone]. He has been getting xanax from Dr. Simone Curia. Last fill #60 on 02/05/11. They do not have any record of me sending in a refill yesterday. Told her I do not want this filled from this office as he is getting it elsewhere. I told her to send the request to Dr. Nedra Hai. I left a message for pt to call me back so I can tell him this.  FYI to md

## 2011-05-18 NOTE — Telephone Encounter (Signed)
Thanks, I agree

## 2011-05-19 ENCOUNTER — Ambulatory Visit (INDEPENDENT_AMBULATORY_CARE_PROVIDER_SITE_OTHER): Payer: Medicaid Other | Admitting: Internal Medicine

## 2011-05-19 ENCOUNTER — Encounter: Payer: Self-pay | Admitting: Internal Medicine

## 2011-05-19 VITALS — BP 144/82 | HR 100 | Ht 69.0 in | Wt 219.0 lb

## 2011-05-19 DIAGNOSIS — D61818 Other pancytopenia: Secondary | ICD-10-CM

## 2011-05-19 DIAGNOSIS — K7460 Unspecified cirrhosis of liver: Secondary | ICD-10-CM

## 2011-05-19 DIAGNOSIS — R188 Other ascites: Secondary | ICD-10-CM

## 2011-05-19 HISTORY — DX: Other pancytopenia: D61.818

## 2011-05-19 NOTE — Assessment & Plan Note (Addendum)
Improved overall after treatment of spontaneous bacterial peritonitis. Infectious disease physician began Septra DS daily. This will serve for prophylaxis. Return in 3 months for routine assessment. Repeat labs in one month.  He reports abstinence since March 2012. I think HIV disease makes transplantation difficult at least if not impossible. Will investigate.

## 2011-05-19 NOTE — Assessment & Plan Note (Signed)
He was started on iron in the hospital. I will have him continue that for a month to recheck his ferritin level along with other labs in a month. His ferritin was in the low normal range so it is not unreasonable to supplement for a while but given her potential liver toxicity want to avoid long-term unnecessary supplementation.

## 2011-05-19 NOTE — Patient Instructions (Addendum)
You have been put in to have labs drawn in 1 month please return to have those done. Please return to see Dr. Leone Payor in 3 months.

## 2011-05-19 NOTE — Assessment & Plan Note (Signed)
Weight is down, he is improved after paracentesis and treatment of SBP. Continue Spiriva lactone and furosemide at current doses as well as low sodium diet. He has some mild breast soreness or tenderness but he does not really seem to have overt gynecomastia. Will monitor

## 2011-05-19 NOTE — Progress Notes (Signed)
  Subjective:    Patient ID: [name], male    DOB: 1965-07-26, 45 y.o.   MRN: [id number]  HPI The patient returns after a hospitalization for spontaneous bacterial peritonitis. He is much improved after treatment with Levaquin and metronidazole. Dr. Luciana Axe started him on trimethoprim sulfamethoxazole double strength for prophylaxis. His weight is down. He is feeling better and he consented was closed again. He has not been confused he denies fever. 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     Review of Systems He did fall and slipped on the floor at the gym. He continues to swim and do light exercise and weights.    Objective:   Physical Exam General:  NAD Lungs:  clear Heart:  S1S2 no rubs, murmurs or gallops Abdomen:  soft and nontender, BS+, moderate ascites Ext:   no edema Skin  some abrasions in the low back and left gluteal area, and the shins as well. Neuro: No asterixis, alert and oriented x3    Data Reviewed:  Hospital records, infectious disease Notes, labs        Assessment & Plan:

## 2011-05-20 ENCOUNTER — Other Ambulatory Visit: Payer: Self-pay | Admitting: *Deleted

## 2011-05-20 ENCOUNTER — Telehealth: Payer: Self-pay | Admitting: Internal Medicine

## 2011-05-20 NOTE — Telephone Encounter (Signed)
I am not treating his anxiety and do not plan to fill that Rx. He should discuss with his PCP.

## 2011-05-20 NOTE — Telephone Encounter (Signed)
Pt called me back. I told him that the pharmacy has him getting xanax from his pcp , Dr. Nedra Hai. Told him our md is going to have him get future refills from Dr. Nedra Hai. Pt upset. States he has been on this for 15 years. Told him he can call his pcp & ask for refills. Out md had also recommended seeing someone from mental health if his anxiety is making him feel he needs more or a higher dose. He states he will call his pcp

## 2011-05-20 NOTE — Telephone Encounter (Signed)
Reviewed with the patient needs labs in January and REV in March.

## 2011-05-20 NOTE — Telephone Encounter (Signed)
Patient notified

## 2011-05-20 NOTE — Telephone Encounter (Signed)
Patient is asking for a refill on xanax 0.5mg  .  Dr Luciana Axe won't refill it.  Please advise

## 2011-05-21 ENCOUNTER — Other Ambulatory Visit: Payer: Self-pay | Admitting: *Deleted

## 2011-05-21 NOTE — Telephone Encounter (Signed)
Pt called asking for xanax. I had spoken with him yesterday & explained we were not going to fill it. The md wants him to go to a mental health facility & be seen for his anxiety. They can rx it if desired. I told him today that we are not his pcp, we only do infectious disease care. He was to have asked his PCP to fill it. He did call them & they will not fill it-he told them during the call that he was not coming back there. So I told pt he had 2 options, find another pcp or go to a mental health facility. He will decide what he wants to do

## 2011-05-24 ENCOUNTER — Other Ambulatory Visit: Payer: Self-pay | Admitting: *Deleted

## 2011-05-24 NOTE — Telephone Encounter (Signed)
States he has been out of it for 2 days now. C/o insomnia,being shaky & sweating at times. He has not looked for another pcp. Did not like Dr. Nedra Hai. I suggested he get another md in the same practice to see him. He lives far from [facility] [place]. He wants to have a doctor there. No mental health places near him & he does not want to come to [place]. States he cannot take antidepressants.  He is asking if he can get "just a little" at a low dose so he feels better. Told him I will relay this to Dr. Luciana Axe. Md had written that he will fill 30 per month but more frequent dosing would have to be thru mental health (or pcp) . He is due for another 30 tabs on 06/12/11 ( I had d/c'd it in error) he wants enough to get thru till then. To Dr. Luciana Axe

## 2011-05-25 ENCOUNTER — Other Ambulatory Visit: Payer: Self-pay | Admitting: Internal Medicine

## 2011-05-25 ENCOUNTER — Other Ambulatory Visit: Payer: Self-pay | Admitting: *Deleted

## 2011-05-25 DIAGNOSIS — F419 Anxiety disorder, unspecified: Secondary | ICD-10-CM

## 2011-05-25 DIAGNOSIS — F411 Generalized anxiety disorder: Secondary | ICD-10-CM

## 2011-05-25 MED ORDER — ALPRAZOLAM 0.25 MG PO TABS: 0.2500 mg | ORAL_TABLET | Freq: Two times a day (BID) | ORAL | Status: DC

## 2011-05-25 NOTE — Telephone Encounter (Signed)
He can have 0.25 mg twice a day for 30 days and no refills.  This will be the ONLY time I will prescribe this again for him (to avoid withdrawal) as this is not my practice and that gives him 30 days find a PCP. Please give him information about detox facilities if he is unable to get a prescriber in the future and if none available, tell him to go to the ED in [place] for detox if he begins to withdrawal again.  Also, if you can, check the local pharmacies [facility]) to assure he has not already received some Xanax and cancel it if he has in the last day.   Thanks

## 2011-05-25 NOTE — Telephone Encounter (Signed)
States he goes to [facility]  [phone] per pharmacy his last fill of xanax 0.5mg   was 04/23/11 #90 . Take one every 8 hours. This was written by Dr. Maurice March. It was a transfer from [place]. They are faxing it here. It was dated 02/16/11 Dr. Luciana Axe had ordered 0.5mg  of same #30 per month. I had deleted this in error when trying to make a note. I can re-enter this.  Dr. Nedra Hai had ordered it as well but will not refill it. The pt told him he would not come back to see him. Discussed with Tomasita Morrow, RN as to what to do about filling this drug.  She suggested asking Dr. Ninetta Lights as Dr. Luciana Axe is on B service. I will speak with him this afternoon

## 2011-05-25 NOTE — Assessment & Plan Note (Signed)
He has been receiving Xanax from his PCP in [place] but has not been back.  He is having signs of withdrawal and was instructed that he can receive 30 days of Xanax at BID and will no longer receive Xanax from this clinic.  He was told to go to the ED if he at any time begins to have withdrawal symptoms for detox.

## 2011-05-25 NOTE — Telephone Encounter (Signed)
The [facility] had filled his xanax before & the dates & amounts correlated with what the pt said. I called in the 0.25 mg bid with NO refills. Called pt & told him about this fill. Told him he would NOT be getting xanax here again and to find a doctor within 30 days. He will look in the [place] area as there is no one in [place]. He agreed with this. I urged him to call today as there may be a waiting period to get in a practice

## 2011-05-27 NOTE — Telephone Encounter (Signed)
I had called there xanax in & explained this was last fill from here. Urged him to find pcp asap

## 2011-06-02 LAB — FUNGUS CULTURE W SMEAR: Fungal Smear: NONE SEEN

## 2011-06-11 ENCOUNTER — Other Ambulatory Visit: Payer: Self-pay | Admitting: Internal Medicine

## 2011-06-11 NOTE — Telephone Encounter (Signed)
Already sent automatic refill to the pharmacy.

## 2011-06-14 ENCOUNTER — Other Ambulatory Visit: Payer: Self-pay | Admitting: *Deleted

## 2011-06-14 DIAGNOSIS — G9009 Other idiopathic peripheral autonomic neuropathy: Secondary | ICD-10-CM

## 2011-06-14 MED ORDER — AMITRIPTYLINE HCL 25 MG PO TABS: 50.0000 mg | ORAL_TABLET | Freq: Every day | ORAL | Status: DC

## 2011-06-17 ENCOUNTER — Other Ambulatory Visit: Payer: Self-pay | Admitting: Internal Medicine

## 2011-06-17 ENCOUNTER — Telehealth: Payer: Self-pay | Admitting: Gastroenterology

## 2011-06-17 DIAGNOSIS — F419 Anxiety disorder, unspecified: Secondary | ICD-10-CM

## 2011-06-17 NOTE — Telephone Encounter (Signed)
Dr. Maurice March approved changing it back to 0.5mg  bid & filling it now. Called to pharmacy

## 2011-06-17 NOTE — Telephone Encounter (Signed)
Message copied by Bernita Buffy on Thu Jun 17, 2011  1:20 PM ------      Message from: Tedra Senegal A      Created: Wed May 19, 2011  3:22 PM       Call patient to remind him to return to have Jan labs drawn.

## 2011-06-17 NOTE — Telephone Encounter (Signed)
States he has 1 pill left. Not due for refill until the 10th. He has not found a PCP yet. Sees a therapist who cannot prescribe. States he has to take more than recommended amount or he cannot sleep. States he had been on 0.5 & now it is 0.25. Says that is not enough. I told him I would check with md & call him on his cell with dr's response to the request to increase the dose back to 0.5 & to fill it early

## 2011-06-17 NOTE — Telephone Encounter (Signed)
Patient reminded to return to have January labs done.

## 2011-06-18 LAB — AFB CULTURE WITH SMEAR (NOT AT ARMC)

## 2011-07-06 ENCOUNTER — Telehealth: Payer: Self-pay | Admitting: Internal Medicine

## 2011-07-06 NOTE — Telephone Encounter (Signed)
Left message for patient to call back  

## 2011-07-06 NOTE — Telephone Encounter (Signed)
Patient would like to have all his labs drawn in march.  I explained that he is overdue by several weeks for labs and that he should not wait until March to have his labs drawn.  The patient has agreed to come for the labs next week.

## 2011-07-12 ENCOUNTER — Telehealth: Payer: Self-pay | Admitting: *Deleted

## 2011-07-12 ENCOUNTER — Other Ambulatory Visit (INDEPENDENT_AMBULATORY_CARE_PROVIDER_SITE_OTHER): Payer: Medicaid Other

## 2011-07-12 DIAGNOSIS — G9009 Other idiopathic peripheral autonomic neuropathy: Secondary | ICD-10-CM

## 2011-07-12 DIAGNOSIS — K7460 Unspecified cirrhosis of liver: Secondary | ICD-10-CM

## 2011-07-12 LAB — CBC WITH DIFFERENTIAL/PLATELET
Eosinophils Relative: 4 % (ref 0.0–5.0)
HCT: 30.1 % — ABNORMAL LOW (ref 39.0–52.0)
Lymphocytes Relative: 21 % (ref 12.0–46.0)
MCV: 81.3 fl (ref 78.0–100.0)
Monocytes Relative: 9 % (ref 3.0–12.0)
Neutrophils Relative %: 64 % (ref 43.0–77.0)
Platelets: 71 10*3/uL — ABNORMAL LOW (ref 150.0–400.0)
RBC: 3.7 Mil/uL — ABNORMAL LOW (ref 4.22–5.81)
RDW: 19 % — ABNORMAL HIGH (ref 11.5–14.6)

## 2011-07-12 LAB — COMPREHENSIVE METABOLIC PANEL
Albumin: 3.3 g/dL — ABNORMAL LOW (ref 3.5–5.2)
Alkaline Phosphatase: 112 U/L (ref 39–117)
BUN: 10 mg/dL (ref 6–23)
Calcium: 8.7 mg/dL (ref 8.4–10.5)
Chloride: 107 mEq/L (ref 96–112)
Creatinine, Ser: 0.8 mg/dL (ref 0.4–1.5)
GFR: 115.26 mL/min (ref >60.00)
Glucose, Bld: 147 mg/dL — ABNORMAL HIGH (ref 70–99)
Potassium: 4.3 mEq/L (ref 3.5–5.1)
Total Protein: 6.6 g/dL (ref 6.0–8.3)

## 2011-07-12 LAB — FERRITIN: Ferritin: 10.9 ng/mL — ABNORMAL LOW (ref 22.0–322.0)

## 2011-07-12 MED ORDER — AMITRIPTYLINE HCL 25 MG PO TABS: 50.0000 mg | ORAL_TABLET | Freq: Every day | ORAL | Status: DC

## 2011-07-12 NOTE — Telephone Encounter (Signed)
Patient called regarding his Rx for Elavil.  Directions were for 2 at bedtime and it was sent with #30.  Jennet Maduro called correction to [facility] in [place]. Wendall Mola CMA

## 2011-07-12 NOTE — Progress Notes (Signed)
Quick Note:  Hgb better in 9 range Ferritin (iron) still low (lower) - stay on ferrous sulfate and monitor for bleeding Will arrange follow-up labs next visit - should see me in March ______

## 2011-07-16 ENCOUNTER — Encounter: Payer: Self-pay | Admitting: Gastroenterology

## 2011-07-16 NOTE — Telephone Encounter (Signed)
error 

## 2011-07-19 ENCOUNTER — Other Ambulatory Visit: Payer: Self-pay | Admitting: Gastroenterology

## 2011-07-19 MED ORDER — POTASSIUM CHLORIDE 20 MEQ PO PACK: 20.0000 meq | PACK | Freq: Every day | ORAL | Status: DC

## 2011-07-19 NOTE — Telephone Encounter (Signed)
Message copied by Bernita Buffy on Mon Jul 19, 2011  1:46 PM ------      Message from: Stan Head E      Created: Mon Jul 19, 2011 12:29 PM       If he has been on it we should refill it - think you will need to ask him      ----- Message -----         From: Dung Prien A. Earlene Plater, [place]         Sent: 07/19/2011  11:00 AM           To: Iva Boop, MD            Received a refill request from the pharmacy for KCL packets. Medication is no ton the patient's medication list but I see in the chart where he was on the medication and don't see a note where it was discontinued. Should we refill this or is the patient to still be on the medication? Please advise.

## 2011-07-19 NOTE — Telephone Encounter (Signed)
Patient states he is taking KCL packets. Medication filled #30 with 3 refills.

## 2011-07-26 ENCOUNTER — Telehealth: Payer: Self-pay | Admitting: Internal Medicine

## 2011-07-26 ENCOUNTER — Telehealth: Payer: Self-pay | Admitting: *Deleted

## 2011-07-26 NOTE — Telephone Encounter (Signed)
States blisters have formed under his tongue. They are keeping him awake at night & it is hard to eat due to the pain. He does not want to come to an appt here as it is too far away & he has one in March. He is going to the UC closest to him. He had thought we could diagnose & rx over the phone. He also wanted to get his Xanax rx corrected to 60 pills per month. I reminded him we had discussed this-see notes. He has Science writer. He has his first appt later this month with a pcp near his home. I told him to ask that md about his xanax. Told him once he has a primary, we will manage just the infectious disease & that md will care for all of his other issues. He agreed.

## 2011-07-26 NOTE — Telephone Encounter (Signed)
Faxed back refill request to [facility] with message that this office is no longer filling this medication.  Refill requests need to go to Dr. Simone Curia.

## 2011-07-26 NOTE — Telephone Encounter (Signed)
Left message for patient to call back  

## 2011-07-27 ENCOUNTER — Other Ambulatory Visit: Payer: Self-pay | Admitting: *Deleted

## 2011-07-27 NOTE — Telephone Encounter (Signed)
Agree 

## 2011-07-27 NOTE — Telephone Encounter (Signed)
[name] from [facility] in [place] called about his xanax. Pt wants refill of #60. I called him back ([phone]) & asked that pharmacist call me back

## 2011-07-27 NOTE — Telephone Encounter (Signed)
Patient reports constipation since starting on iron.  He is taking lactulose qid.  He was having a BM 3-4 times a week prior to starting iron supplement.  He is using a OTC enema to have a BM.  He is asked to start on Miralax BID and titrate for effect.  He will call back for any questions and keep the appt for 08/18/11

## 2011-07-27 NOTE — Telephone Encounter (Signed)
Pharmacist called back. I told him I will check with md this Thursday about refill of #60

## 2011-07-29 ENCOUNTER — Other Ambulatory Visit: Payer: Self-pay | Admitting: *Deleted

## 2011-07-29 NOTE — Telephone Encounter (Signed)
I spoke with Dr. Maurice March about pt request for more xanax. He denied the increase & will not be filling it again. Pt is to get any further refills from his psychiatrist. I called the pharmacist & asked that he call me back. He had called yesterday about this I lmm for pt to call me so I can tell him the above

## 2011-07-29 NOTE — Telephone Encounter (Signed)
The other pharmacist called me back & I told him the message-no more xanax from [place]

## 2011-07-29 NOTE — Telephone Encounter (Signed)
I told him that mds here will not be refilling xanax any longer. States he used to see a psychiatrist but no longer goes. Has 1st appt with a new pcp next Friday. Ii suggested that if he is out of xanax, he may want to call & see if he can be seen sooner. He said ok

## 2011-07-30 ENCOUNTER — Telehealth: Payer: Self-pay | Admitting: *Deleted

## 2011-07-30 ENCOUNTER — Other Ambulatory Visit: Payer: Self-pay | Admitting: *Deleted

## 2011-07-30 DIAGNOSIS — F419 Anxiety disorder, unspecified: Secondary | ICD-10-CM

## 2011-07-30 MED ORDER — ALPRAZOLAM 0.5 MG PO TABS: 0.5000 mg | ORAL_TABLET | Freq: Two times a day (BID) | ORAL | Status: DC

## 2011-07-30 NOTE — Telephone Encounter (Signed)
Patient called requesting refill on Xanax until his appointment with PCP on 08/06/11.  Spoke with Dr. Maurice March and he okd  0.5 mg 2 x daily #60 with 0 refills.  Patient is to keep appointment with PCP and get any further refills from new provider.  Patient verbalized understanding. Wendall Mola CMA

## 2011-07-31 ENCOUNTER — Telehealth: Payer: Self-pay | Admitting: Gastroenterology

## 2011-07-31 NOTE — Telephone Encounter (Signed)
On call note @ 1000. Pt mistakenly took mothers Naldolol 40mg , Synthroid 88 mcg and Plavix 75 mg this morning. He feels fine. I reviewed his meds, problem list and history. Advised him to have limited activity today: no exercise, no driving and to get up slowly and carefully for at least 8 hours. Call or bring to ED if any unusual symptoms develop.

## 2011-08-02 ENCOUNTER — Other Ambulatory Visit: Payer: Self-pay | Admitting: Internal Medicine

## 2011-08-03 ENCOUNTER — Telehealth: Payer: Self-pay | Admitting: Internal Medicine

## 2011-08-03 NOTE — Telephone Encounter (Signed)
Patient reports he is having to use an enema BID to have a BM. He reports he has large results from each enema.   This started after starting on the iron supplement.  He has stopped the iron as of yesterday.  Miralax is not helping.  He reports he is taking lactulose QID as ordered.  He reports his LUQ pain is worse after a meal.  He is asking if he should increase his nexium? He has a follow up with you on 3/6.  Please advise

## 2011-08-03 NOTE — Telephone Encounter (Signed)
Left message for patient to call back  

## 2011-08-04 NOTE — Telephone Encounter (Signed)
Patient advised.

## 2011-08-04 NOTE — Telephone Encounter (Signed)
Stay off iron and see if that fixes it

## 2011-08-13 HISTORY — PX: COLONOSCOPY: SHX174

## 2011-08-18 ENCOUNTER — Other Ambulatory Visit: Payer: Medicaid Other

## 2011-08-18 ENCOUNTER — Encounter: Payer: Self-pay | Admitting: Internal Medicine

## 2011-08-18 ENCOUNTER — Ambulatory Visit (INDEPENDENT_AMBULATORY_CARE_PROVIDER_SITE_OTHER): Payer: Medicaid Other | Admitting: Internal Medicine

## 2011-08-18 VITALS — BP 114/62 | HR 80 | Ht 69.0 in | Wt 228.2 lb

## 2011-08-18 DIAGNOSIS — K7290 Hepatic failure, unspecified without coma: Secondary | ICD-10-CM

## 2011-08-18 DIAGNOSIS — D509 Iron deficiency anemia, unspecified: Secondary | ICD-10-CM | POA: Insufficient documentation

## 2011-08-18 DIAGNOSIS — I8500 Esophageal varices without bleeding: Secondary | ICD-10-CM

## 2011-08-18 DIAGNOSIS — K7030 Alcoholic cirrhosis of liver without ascites: Secondary | ICD-10-CM

## 2011-08-18 DIAGNOSIS — R188 Other ascites: Secondary | ICD-10-CM

## 2011-08-18 DIAGNOSIS — B20 Human immunodeficiency virus [HIV] disease: Secondary | ICD-10-CM

## 2011-08-18 LAB — CBC WITH DIFFERENTIAL/PLATELET
Eosinophils Relative: 3 % (ref 0–5)
HCT: 29.8 % — ABNORMAL LOW (ref 39.0–52.0)
Lymphocytes Relative: 26 % (ref 12–46)
Lymphs Abs: 0.9 10*3/uL (ref 0.7–4.0)
MCV: 80.8 fL (ref 78.0–100.0)
Monocytes Absolute: 0.5 10*3/uL (ref 0.1–1.0)
Platelets: 69 10*3/uL — ABNORMAL LOW (ref 150–400)
RBC: 3.69 MIL/uL — ABNORMAL LOW (ref 4.22–5.81)
RDW: 21 % — ABNORMAL HIGH (ref 11.5–15.5)
WBC: 3.5 10*3/uL — ABNORMAL LOW (ref 4.0–10.5)

## 2011-08-18 LAB — COMPLETE METABOLIC PANEL WITH GFR
AST: 65 U/L — ABNORMAL HIGH (ref 0–37)
Alkaline Phosphatase: 113 U/L (ref 39–117)
BUN: 10 mg/dL (ref 6–23)
Calcium: 9 mg/dL (ref 8.4–10.5)
Chloride: 107 mEq/L (ref 96–112)
Creat: 0.76 mg/dL (ref 0.50–1.35)
Glucose, Bld: 96 mg/dL (ref 70–99)
Potassium: 4.6 mEq/L (ref 3.5–5.3)

## 2011-08-18 MED ORDER — SPIRONOLACTONE 50 MG PO TABS: ORAL_TABLET | ORAL | Status: DC

## 2011-08-18 NOTE — Patient Instructions (Addendum)
You have been given a separate informational sheet regarding your tobacco use, the importance of quitting and local resources to help you quit. Please change spironolactone to two in the am Please follow the 2 GM low sodium diet provided to you today as a separate handout We will call you with Dr Marvell Fuller recommendations after he reviews your lab work

## 2011-08-18 NOTE — Progress Notes (Addendum)
  Subjective:    Patient ID: [name], male    DOB: Nov 16, 1965, 46 y.o.   MRN: [id number]  HPI The patient returns for followup of alcoholic cirrhosis with hepatic encephalopathy, history of esophageal varices and bleeding. He also has a history of peptic ulcer disease. He feels like he is doing well overall. He notes some intermittent left upper quadrant pressure when he eats too much, which is really a chronic recurrent tissue for many years. He has been gaining weight but denies swelling. He says he is feeling better and eating more overall. He is exercising frequently with swimming and aerobics but is not lifting heavy weights. He denies confusion or excessive sleepiness. I had recommended over-the-counter iron supplementation after last visit when his iron was low but he developed constipation and has not been on iron except for a few days. He had labs drawn today.   Medicines, allergies, past medical and surgical history is reviewed in the electronic medical record. Were updated as well.   Review of Systems No fevers, chills, denies other recent illness. Slight dry cough - today.    Objective:   Physical Exam General:  NAD Eyes:   anicteric Lungs:  clear Heart:  S1S2 no rubs, murmurs or gallops Abdomen:  soft and nontender, BS+, obese, no obvious ascites, no HSM Ext:   no edema Breasts:  Mild gynecomastia Neuro: Alert and oriented x 3, no asterixis   Data Reviewed:   Wt Readings from Last 3 Encounters:  08/18/11 228 lb 4 oz (103.534 kg)  05/19/11 219 lb (99.338 kg)  05/13/11 216 lb (97.977 kg)          Assessment & Plan:   1. Alcoholic cirrhosis of liver   2. Esophageal varices without mention of bleeding   3. Hepatic encephalopathy   4. Other ascites   5. Iron deficiency anemia      Overall he is improved with respect to the above. He has no active symptoms or problems that I can tell. He stopped taking his iron because of constipation and I suspect his  anemia persists.  Plan:  #1 review CBC in company as a metabolic panel drawn at the ID clinic today #2 schedule upper GI endoscopy for followup esophageal varices in the future, we'll defer that until Hospital schedule for me is available. Suspect she needs monitored anesthesia care. #3 low sodium diet recommended again today and handout given #4 routine followup in about 6 months with the upper endoscopy in between  Lab Results  Component Value Date   WBC 3.5* 07/12/2011   HGB 9.9* 07/12/2011   HCT 30.1* 07/12/2011   MCV 81.3 07/12/2011   PLT 71.0* 07/12/2011      Chemistry      Component Value Date/Time   NA 137 08/18/2011 1048   K 4.6 08/18/2011 1048   CL 107 08/18/2011 1048   CO2 22 08/18/2011 1048   BUN 10 08/18/2011 1048   CREATININE 0.76 08/18/2011 1048   CREATININE 0.8 07/12/2011 1053      Component Value Date/Time   CALCIUM 9.0 08/18/2011 1048   ALKPHOS 113 08/18/2011 1048   AST 65* 08/18/2011 1048   ALT 39 08/18/2011 1048   BILITOT 1.9* 08/18/2011 1048     Hemoglobin is still low. We'll see if he can take ferrous gluconate.

## 2011-08-19 ENCOUNTER — Other Ambulatory Visit: Payer: Medicaid Other

## 2011-08-19 LAB — T-HELPER CELL (CD4) - (RCID CLINIC ONLY): CD4 % Helper T Cell: 27 % — ABNORMAL LOW (ref 33–55)

## 2011-08-20 LAB — HIV-1 RNA QUANT-NO REFLEX-BLD
HIV 1 RNA Quant: <20 copies/mL (ref <20)
HIV-1 RNA Quant, Log: <1.3 {Log} (ref <1.30)

## 2011-08-26 ENCOUNTER — Telehealth: Payer: Self-pay

## 2011-08-26 ENCOUNTER — Other Ambulatory Visit: Payer: Self-pay

## 2011-08-26 NOTE — Telephone Encounter (Signed)
Patient is scheduled per Dr Leone Payor for 10/26/11 8:30 @ [facility] for EGD for screening of esophageal varices.  I Have left a message for him to call back to discuss.

## 2011-08-27 NOTE — Telephone Encounter (Signed)
Left message for patient to call back  

## 2011-08-30 MED ORDER — MIDAZOLAM HCL 10 MG/2ML IJ SOLN
INTRAMUSCULAR | Status: AC
  Filled 2011-08-30: qty 4

## 2011-08-30 MED ORDER — FENTANYL CITRATE 0.05 MG/ML IJ SOLN
INTRAMUSCULAR | Status: AC
  Filled 2011-08-30: qty 4

## 2011-08-30 NOTE — Telephone Encounter (Signed)
Patient advised of appt .  He verbalized understanding to be NPO after midnight and to arrive at 7:30

## 2011-09-02 ENCOUNTER — Ambulatory Visit: Payer: Medicaid Other | Admitting: Infectious Diseases

## 2011-09-03 ENCOUNTER — Telehealth: Payer: Self-pay | Admitting: *Deleted

## 2011-09-03 ENCOUNTER — Ambulatory Visit (INDEPENDENT_AMBULATORY_CARE_PROVIDER_SITE_OTHER): Payer: Medicaid Other | Admitting: Infectious Diseases

## 2011-09-03 ENCOUNTER — Encounter: Payer: Self-pay | Admitting: Infectious Diseases

## 2011-09-03 VITALS — BP 128/70 | HR 85 | Temp 98.0°F | Ht 72.0 in | Wt 234.0 lb

## 2011-09-03 DIAGNOSIS — G9009 Other idiopathic peripheral autonomic neuropathy: Secondary | ICD-10-CM

## 2011-09-03 DIAGNOSIS — F419 Anxiety disorder, unspecified: Secondary | ICD-10-CM

## 2011-09-03 DIAGNOSIS — G4700 Insomnia, unspecified: Secondary | ICD-10-CM

## 2011-09-03 DIAGNOSIS — F411 Generalized anxiety disorder: Secondary | ICD-10-CM

## 2011-09-03 MED ORDER — ALPRAZOLAM 1 MG PO TABS: 1.0000 mg | ORAL_TABLET | Freq: Every evening | ORAL | Status: DC | PRN

## 2011-09-03 MED ORDER — AMITRIPTYLINE HCL 25 MG PO TABS: 25.0000 mg | ORAL_TABLET | Freq: Every day | ORAL | Status: DC

## 2011-09-03 NOTE — Telephone Encounter (Signed)
[name] in [place]. Using his aunt's valtrex

## 2011-09-03 NOTE — Telephone Encounter (Signed)
He left a message that he has painful blisters under his tongue. States he gets this every 3-4 months. Wants Valtrex called in. Not on his list. Also wants to know if he should double up on them while in acute phase.Marland Kitchen His # is [phone]

## 2011-09-03 NOTE — Progress Notes (Signed)
Patient ID: [name], male   DOB: Aug 21, 1965, 46 y.o.   MRN: [id number] Followup Visit  [name] has been living with his parents for several months and has been active in [place]. He has been adherent to his ARVs. Dr. Stan Head follows him for his liver disease and alcohol related cirrhosis. He has been sober for about a year now. He has no cough sweats or fevers. He take his Aripla for HIV daily and his CD4 is 270 and HIV is <20 copies/ml. I have refilled his alprazolam 1.0mg  po bid and amytriptaline 25mg  hs the latter for his neuropathy. BP 128/70  Pulse 85  Temp 98 F (36.7 C)  Ht 6' (1.829 m)  Wt 234 lb (106.142 kg)  BMI 31.74 kg/m2 Much healthier appearance since last visit. No rashes. No thrush orally or adenopathy. Cannot discern any ascites or peripheral edema. No scleral icterus. Impression     HIV  Suppressed and improving with good adherence                         ETOH hepatitis improved and only mild elevation of bilirubin. Plan Continue current antiHIV regimen of Atripla and will see in 4 months.[name]

## 2011-09-09 ENCOUNTER — Other Ambulatory Visit: Payer: Self-pay | Admitting: Internal Medicine

## 2011-09-16 ENCOUNTER — Telehealth: Payer: Self-pay | Admitting: Internal Medicine

## 2011-09-16 DIAGNOSIS — K7460 Unspecified cirrhosis of liver: Secondary | ICD-10-CM

## 2011-09-16 NOTE — Telephone Encounter (Signed)
Patient reports an increase in his breast size and and increase in fat in his abdomen.  He is wondering if this is a side effect from his meds.  Please advise

## 2011-09-17 MED ORDER — AMILORIDE HCL 5 MG PO TABS: 10.0000 mg | ORAL_TABLET | Freq: Every day | ORAL | Status: DC

## 2011-09-17 NOTE — Telephone Encounter (Signed)
He has known gynecomastia from the spironolactone - if that is bothering him more then   1) stop spironolactone 2) start amiloride 10 mg daily #30 with 5 refills - he needs to let us know if he cannot get this 3) BMET and weight in 1 month 4) also needs to be set up for EGD in May at [facility] with propofol to follow-up and possibly band esophageal varices

## 2011-09-17 NOTE — Telephone Encounter (Signed)
Patient is was previously scheduled for endo on 10/26/11. I have left mim a detailed message with the new meds and instructions

## 2011-09-27 ENCOUNTER — Other Ambulatory Visit: Payer: Self-pay | Admitting: Infectious Diseases

## 2011-09-27 ENCOUNTER — Telehealth: Payer: Self-pay | Admitting: Internal Medicine

## 2011-09-27 DIAGNOSIS — Z79899 Other long term (current) drug therapy: Secondary | ICD-10-CM

## 2011-09-27 DIAGNOSIS — Z113 Encounter for screening for infections with a predominantly sexual mode of transmission: Secondary | ICD-10-CM

## 2011-09-27 NOTE — Telephone Encounter (Signed)
I have left a message for the patient to call back for any additional questions.  He needs a BMET and weight on 10/15/11.   He is scheduled for an EGD @ [facility] 10/26/11 to arrive at 7:30.

## 2011-09-30 MED ORDER — AMITRIPTYLINE HCL 50 MG PO TABS: 50.0000 mg | ORAL_TABLET | Freq: Every day | ORAL | Status: DC

## 2011-09-30 NOTE — Telephone Encounter (Signed)
Per Dr. Maurice March he should be taking 50mg  nightly. I called pt to let him know this is appropriate & that I am sending a refill with 50mg  tabb one at bedtime

## 2011-09-30 NOTE — Telephone Encounter (Signed)
Given to md's rn today to get a rx

## 2011-09-30 NOTE — Telephone Encounter (Signed)
Pt returned my call. I told him about using the cortaid. States he saw his pcp who did give him valtrex. States he has been off alcohol for a year now.  States he is taking 50mg  elavil each night. Our records show 25 mg per night. Will check with md if ok to change this

## 2011-09-30 NOTE — Telephone Encounter (Signed)
Per Dr. Maurice March, this is not herpes. Pt should get Cortaid & apply to areas 3 to 4 times a day. I called pt [phone]. I lm asking him to call me

## 2011-10-06 ENCOUNTER — Telehealth: Payer: Self-pay | Admitting: *Deleted

## 2011-10-06 NOTE — Telephone Encounter (Signed)
A staff member from [facility] called asking if he needed premedication or if there was anything they needed to know about him. She asked specifically about his liver. He is in the chair now. They are faxing a release now. I paged Dr. Maurice March. He stated to just follow usual precautions that they do with every pt. No premeds. I am waiting for the fax. rec'd it & called them .[phone] & fax [phone]. I spoke with [facility]. Told her what md said

## 2011-10-20 ENCOUNTER — Encounter: Payer: Self-pay | Admitting: Internal Medicine

## 2011-10-20 ENCOUNTER — Other Ambulatory Visit (INDEPENDENT_AMBULATORY_CARE_PROVIDER_SITE_OTHER): Payer: Medicaid Other

## 2011-10-20 DIAGNOSIS — K7460 Unspecified cirrhosis of liver: Secondary | ICD-10-CM

## 2011-10-20 LAB — BASIC METABOLIC PANEL
BUN: 10 mg/dL (ref 6–23)
CO2: 23 mEq/L (ref 19–32)
Calcium: 8.5 mg/dL (ref 8.4–10.5)
Chloride: 110 mEq/L (ref 96–112)
Glucose, Bld: 137 mg/dL — ABNORMAL HIGH (ref 70–99)
Potassium: 4.9 mEq/L (ref 3.5–5.1)
Sodium: 139 mEq/L (ref 135–145)

## 2011-10-20 NOTE — Progress Notes (Signed)
Quick Note:  BMEt ok Weight slightly lower now on amiloride Will discuss further at 5/14 EGD Inform patient please ______

## 2011-10-20 NOTE — Progress Notes (Unsigned)
Patient ID: [name], male   DOB: 09-05-1965, 46 y.o.   MRN: [id number] Patient here today for weight check 232 lbs.

## 2011-10-26 ENCOUNTER — Encounter (HOSPITAL_COMMUNITY)
Admission: RE | Disposition: A | Discharge status: Home / Self Care | Payer: Self-pay | Source: Ambulatory Visit | Attending: Internal Medicine

## 2011-10-26 ENCOUNTER — Other Ambulatory Visit: Payer: Self-pay

## 2011-10-26 ENCOUNTER — Ambulatory Visit (HOSPITAL_COMMUNITY)
Admission: RE | Admit: 2011-10-26 | Discharge: 2011-10-26 | Disposition: A | Discharge status: Home / Self Care | Payer: Medicaid Other | Source: Ambulatory Visit | Attending: Internal Medicine | Admitting: Internal Medicine

## 2011-10-26 ENCOUNTER — Encounter (HOSPITAL_COMMUNITY): Payer: Self-pay | Admitting: *Deleted

## 2011-10-26 DIAGNOSIS — B20 Human immunodeficiency virus [HIV] disease: Secondary | ICD-10-CM | POA: Insufficient documentation

## 2011-10-26 DIAGNOSIS — I8510 Secondary esophageal varices without bleeding: Secondary | ICD-10-CM | POA: Insufficient documentation

## 2011-10-26 DIAGNOSIS — E785 Hyperlipidemia, unspecified: Secondary | ICD-10-CM | POA: Insufficient documentation

## 2011-10-26 DIAGNOSIS — K7460 Unspecified cirrhosis of liver: Secondary | ICD-10-CM

## 2011-10-26 DIAGNOSIS — K219 Gastro-esophageal reflux disease without esophagitis: Secondary | ICD-10-CM | POA: Insufficient documentation

## 2011-10-26 DIAGNOSIS — I8500 Esophageal varices without bleeding: Secondary | ICD-10-CM

## 2011-10-26 DIAGNOSIS — I10 Essential (primary) hypertension: Secondary | ICD-10-CM | POA: Insufficient documentation

## 2011-10-26 DIAGNOSIS — F17200 Nicotine dependence, unspecified, uncomplicated: Secondary | ICD-10-CM | POA: Insufficient documentation

## 2011-10-26 DIAGNOSIS — K7030 Alcoholic cirrhosis of liver without ascites: Secondary | ICD-10-CM | POA: Insufficient documentation

## 2011-10-26 DIAGNOSIS — F1020 Alcohol dependence, uncomplicated: Secondary | ICD-10-CM | POA: Insufficient documentation

## 2011-10-26 HISTORY — DX: Gastro-esophageal reflux disease without esophagitis: K21.9

## 2011-10-26 HISTORY — DX: Spontaneous bacterial peritonitis: K65.2

## 2011-10-26 HISTORY — PX: ESOPHAGOGASTRODUODENOSCOPY: SHX5428

## 2011-10-26 SURGERY — EGD (ESOPHAGOGASTRODUODENOSCOPY)
Anesthesia: Moderate Sedation

## 2011-10-26 MED ORDER — SODIUM CHLORIDE 0.9 % IV SOLN
Freq: Once | INTRAVENOUS | Status: AC
  Administered 2011-10-26: 500 mL via INTRAVENOUS

## 2011-10-26 MED ORDER — DIPHENHYDRAMINE HCL 50 MG/ML IJ SOLN
INTRAMUSCULAR | Status: DC | PRN
  Administered 2011-10-26: 12.5 mg via INTRAVENOUS

## 2011-10-26 MED ORDER — FENTANYL NICU IV SYRINGE 50 MCG/ML
INJECTION | INTRAMUSCULAR | Status: DC | PRN
  Administered 2011-10-26 (×3): 25 ug via INTRAVENOUS

## 2011-10-26 MED ORDER — MIDAZOLAM HCL 10 MG/2ML IJ SOLN
INTRAMUSCULAR | Status: AC
  Filled 2011-10-26: qty 4

## 2011-10-26 MED ORDER — MIDAZOLAM HCL 10 MG/2ML IJ SOLN
INTRAMUSCULAR | Status: DC | PRN
  Administered 2011-10-26: 2 mg via INTRAVENOUS
  Administered 2011-10-26: 1 mg via INTRAVENOUS
  Administered 2011-10-26 (×2): 2 mg via INTRAVENOUS

## 2011-10-26 MED ORDER — DIPHENHYDRAMINE HCL 50 MG/ML IJ SOLN
INTRAMUSCULAR | Status: AC
  Filled 2011-10-26: qty 1

## 2011-10-26 MED ORDER — BUTAMBEN-TETRACAINE-BENZOCAINE 2-2-14 % EX AERO
INHALATION_SPRAY | CUTANEOUS | Status: DC | PRN
  Administered 2011-10-26: 2 via TOPICAL

## 2011-10-26 MED ORDER — FENTANYL CITRATE 0.05 MG/ML IJ SOLN
INTRAMUSCULAR | Status: AC
  Filled 2011-10-26: qty 4

## 2011-10-26 NOTE — H&P (Signed)
  CC: F/u esophageal varices  HPI  Patient with cirrhosis and hx of bleeding esophageal varices for surveillance and possible ligatiopn. No complaints today.  Allergies  Allergen Reactions  . Penicillins     REACTION: hives   [email or web address] @ Past Medical History  Diagnosis Date  . HIV DISEASE 06/23/2006  . HYPERLIPIDEMIA 06/23/2006  . ANXIETY 06/23/2006  . DEPRESSION 06/23/2006  . 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 HYPERTENSION 06/23/2006  . 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 Ascites 11/13/2009  . STRAIN, CHEST WALL 03/15/2007  . 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 Gastric ulcer 06/2010  . Alcoholic cirrhosis of liver   . Alcoholism, chronic   . Iron deficiency anemia   . GERD (gastroesophageal reflux disease)    Past Surgical History  Procedure Date  . Esophagogastroduodenoscopy w/ banding 06/26/2010    variceal ligation  . Carpal tunnel release     left  . Esophagogastroduodenoscopy 07/01/2010;  08/12/10    small varices, gastric ulcer   History   Social History  . Marital Status: Single    Spouse Name: N/A    Number of Children: N/A  . Years of Education: N/A   Occupational History  . disability    Social History Main Topics  . Smoking status: Current Everyday Smoker -- 0.1 packs/day for 10 years    Types: Cigarettes  . Smokeless tobacco: Never Used   Comment: form given 05-19-11  . Alcohol Use: No     pt is an alcoholic currently in [employer]  . Drug Use: No  . Sexually Active: Yes    Birth Control/ Protection: Condom     pt. declined condoms   Other Topics Concern  . None   Social History Narrative  . None   Family History  Problem Relation Age of Onset  . Hyperlipidemia Mother   . Hypertension Mother   . Hypertension Father   . Drug abuse Brother   . Breast cancer Maternal Aunt     maternal great aunt  . Colon cancer Neg Hx     . Alcohol abuse Other   . Breast cancer Mother     questionable  . Heart disease Maternal Uncle   . Irritable bowel syndrome Father   . Irritable bowel syndrome Paternal Aunt      Please see PE in pre-sedation evaluation.  Ass/Plan:  Alcoholic cirrhosis and esophageal varices.  For surveillance EGD  The risks and benefits as well as alternatives of endoscopic procedure(s) have been discussed and reviewed. All questions answered. The patient agrees to proceed.

## 2011-10-26 NOTE — Progress Notes (Signed)
Per endoscopy report the patient needs labs in 2 months and REV recall for 6 months.  Recall placed and labs ordered

## 2011-10-26 NOTE — Op Note (Addendum)
[facility] [address] [place], [place]  [address]  ENDOSCOPY PROCEDURE REPORT  PATIENT:  [name], Carl  MR#:  [id number] BIRTHDATE:  30-Dec-1965, 45 yrs. old  GENDER:  male  ENDOSCOPIST:  Iva Boop, MD, [facility]  PROCEDURE DATE:  10/26/2011 PROCEDURE:  EGD, diagnostic 19147 ASA CLASS:  Class III INDICATIONS:  Known esophageal varices with previous bleeding, for surveillance exam.  MEDICATIONS:   Benadryl 12.5 mg IV, Fentanyl 75 mcg IV, Versed 7 mg IV TOPICAL ANESTHETIC:  Cetacaine Spray  DESCRIPTION OF PROCEDURE:   After the risks benefits and alternatives of the procedure were thoroughly explained, informed consent was obtained.  The EG-[id number] ([id number]) endoscope was introduced through the mouth and advanced to the second portion of the duodenum, without limitations.  The instrument was slowly withdrawn as the mucosa was fully examined. <<PROCEDUREIMAGES>>  Trace esophageal varices were seen in the in the distal esophagus. Three columns, mostly flat when insufflated and no stigmata of bleeding, mid to distal esophagus and slightly more prominent on gastric side of GE junction.  Otherwise the examination was normal.    Retroflexed views revealed Retroflexion exam demonstrated findings as previously described.    The scope was then withdrawn from the patient and the procedure completed.  COMPLICATIONS:  None  ENDOSCOPIC IMPRESSION: 1) Gastroesophageal varices, trace in the mid o distal esophagus and slightly more prominent on gastric side of GE junction. 2) Otherwise normal examination  REPEAT EXAM:  In 1 year(s) for EGD. Office visit with Dr. Leone Payor in 6 months BMET through Dr. Marvell Fuller office in 2 months.Office will arrange.  Addendum: CBC, LFT's, and ferritin also in 2 months  Iva Boop, MD, [facility]  CC:  The Patient and Alinda Deem, MD  n. REVISED:  10/26/2011 09:10 AM eSIGNED:   Iva Boop at 10/26/2011 09:10 AM  Rachael Fee,  [phone]

## 2011-10-26 NOTE — Discharge Instructions (Addendum)
Things look good today - the varices are small and banding not required. I want to see you in 6 months and have labs in 2 months - my office should arrange the labs by calling you. Iva Boop, MD, [facility]  Esophageal Varices Esophageal varices are blood vessels in the esophagus (the tube that carries food to the stomach). Under normal circumstances, these blood vessels carry very small amounts of blood. If the liver is damaged, and the main vein (portal vein) that carries blood is blocked, larger amounts of blood might back up into these esophageal varices. The esophageal varices are too fragile for this extra blood flow and pressure. They may swell and then break, causing life-threatening bleeding (hemorrhage). CAUSES  Any kind of liver disease can cause esophageal varices. Cirrhosis of the liver, usually due to alcoholism, is the most common reason. Other reasons include:  Severe heart failure: When the heart cannot pump blood around the body effectively enough, pressure may rise in the portal vein.   A blood clot in the portal vein.   Sarcoidosis. This is an inflammatory disease that can affect the liver.   Schistosomiasis. A parasitic infection that can cause liver damage.  SYMPTOMS  Symptoms may include:  Vomiting bright red or black coffee ground like material.   Black, tarry stools.   Low blood pressure.   Dizziness.   Loss of consciousness.  DIAGNOSIS  When someone has known cirrhosis, their caregiver may screen them for the presence of esophageal varices. Tests that are used include:  Endoscopy (esophagogastroduodenoscopy or EGD). A thin, lighted tube is inserted through the mouth and into the esophagus. The caregiver will rate the varices according to their size and the presence of red streaks. These characteristics help determine the risk of bleeding.   Imaging tests. CT scans and MRI scans can both show esophageal varices. However, they cannot predict likelihood of  bleeding.  TREATMENT  There are different types of treatment used for esophageal varices. These include:  Variceal ligation. During EGD, the caregiver places a rubber band around the vein to prevent bleeding.   Injection therapy. During EGD, the caregiver can inject the veins with a solution that shrinks them and scars them closed.   Medications can decrease the pressure in the esophageal varices and prevent bleeding.   Balloon tamponade. A tube is put into the esophagus and a balloon is passed down it. When the balloon is inflated, it puts pressures on the veins and stops the bleeding.   Shunt. A small tube is placed within the liver veins. This decreases the blood flow and pressure to the varices, decreasing bleeding risk.   Liver transplant may be done as a last resort.  HOME CARE INSTRUCTIONS   Take all medications exactly as directed.   Follow any prescribed diet. Avoid alcohol if recommended.   Follow instructions regarding both rest and physical activity.   Seek help to treat a drinking problem.  SEEK IMMEDIATE MEDICAL CARE IF:  You vomit blood or coffee-ground material.   You pass black tarry stools or bright red blood in the stools.   You are dizzy, lightheaded or faint.   You are unable to eat or drink.   You experience chest pain.  Document Released: 08/21/2003 Document Revised: 05/20/2011 Document Reviewed: 08/01/2008 ExitCare Patient Information 2012 ExitCare, [facility]   Endoscopy Care After Please read the instructions outlined below and refer to this sheet in the next few weeks. These discharge instructions provide you with general information  on caring for yourself after you leave the hospital. Your doctor may also give you specific instructions. While your treatment has been planned according to the most current medical practices available, unavoidable complications occasionally occur. If you have any problems or questions after discharge, please call your  doctor. HOME CARE INSTRUCTIONS Activity  You may resume your regular activity but move at a slower pace for the next 24 hours.   Take frequent rest periods for the next 24 hours.   Walking will help expel (get rid of) the air and reduce the bloated feeling in your abdomen.   No driving for 24 hours (because of the anesthesia (medicine) used during the test).   You may shower.   Do not sign any important legal documents or operate any machinery for 24 hours (because of the anesthesia used during the test).  Nutrition  Drink plenty of fluids.   You may resume your normal diet.   Begin with a light meal and progress to your normal diet.   Avoid alcoholic beverages for 24 hours or as instructed by your caregiver.  Medications You may resume your normal medications unless your caregiver tells you otherwise. What you can expect today  You may experience abdominal discomfort such as a feeling of fullness or "gas" pains.   You may experience a sore throat for 2 to 3 days. This is normal. Gargling with salt water may help this.  Follow-up Your doctor will discuss the results of your test with you. SEEK IMMEDIATE MEDICAL CARE IF:  You have excessive nausea (feeling sick to your stomach) and/or vomiting.   You have severe abdominal pain and distention (swelling).   You have trouble swallowing.   You have a temperature over 100 F (37.8 C).   You have rectal bleeding or vomiting of blood.  Document Released: 01/13/2004 Document Revised: 05/20/2011 Document Reviewed: 07/26/2007 [facility] Patient Information 2012 [place], [place].Marland Kitchen

## 2011-10-27 ENCOUNTER — Encounter (HOSPITAL_COMMUNITY): Payer: Self-pay | Admitting: Internal Medicine

## 2011-10-29 ENCOUNTER — Other Ambulatory Visit: Payer: Self-pay | Admitting: Internal Medicine

## 2011-11-10 ENCOUNTER — Telehealth: Payer: Self-pay | Admitting: Internal Medicine

## 2011-11-10 NOTE — Telephone Encounter (Signed)
Patient needs labs in July and REV in November per EGD report 10/26/11.  I have left him a detailed message.  He is asked to call back for any questions

## 2011-11-26 ENCOUNTER — Telehealth: Payer: Self-pay | Admitting: Internal Medicine

## 2011-11-26 NOTE — Telephone Encounter (Signed)
I am not trying to reach the patient.  I did leave him a message to call back

## 2011-11-26 NOTE — Telephone Encounter (Signed)
Patient has concerns with side effects of medications.  He reports increase in gynecomastia.  He will come in and see Dr Leone Payor on 12/22/11

## 2011-12-03 ENCOUNTER — Other Ambulatory Visit: Payer: Self-pay | Admitting: Infectious Diseases

## 2011-12-03 DIAGNOSIS — B20 Human immunodeficiency virus [HIV] disease: Secondary | ICD-10-CM

## 2011-12-03 DIAGNOSIS — F419 Anxiety disorder, unspecified: Secondary | ICD-10-CM

## 2011-12-03 MED ORDER — ALPRAZOLAM 1 MG PO TABS: 1.0000 mg | ORAL_TABLET | Freq: Two times a day (BID) | ORAL | Status: DC | PRN

## 2011-12-08 ENCOUNTER — Other Ambulatory Visit: Payer: Medicaid Other

## 2011-12-09 ENCOUNTER — Telehealth: Payer: Self-pay | Admitting: Internal Medicine

## 2011-12-09 NOTE — Telephone Encounter (Signed)
LM for pt to call back with their questions.

## 2011-12-14 NOTE — Telephone Encounter (Signed)
Pt did not call back, coming in 12/22/11 to see Dr. Stan Head.

## 2011-12-15 ENCOUNTER — Telehealth: Payer: Self-pay | Admitting: Internal Medicine

## 2011-12-15 ENCOUNTER — Other Ambulatory Visit (INDEPENDENT_AMBULATORY_CARE_PROVIDER_SITE_OTHER): Payer: Medicaid Other

## 2011-12-15 ENCOUNTER — Telehealth: Payer: Self-pay | Admitting: *Deleted

## 2011-12-15 DIAGNOSIS — K7460 Unspecified cirrhosis of liver: Secondary | ICD-10-CM

## 2011-12-15 DIAGNOSIS — D649 Anemia, unspecified: Secondary | ICD-10-CM

## 2011-12-15 LAB — CBC WITH DIFFERENTIAL/PLATELET
Basophils Absolute: 0 10*3/uL (ref 0.0–0.1)
Eosinophils Absolute: 0.1 10*3/uL (ref 0.0–0.7)
Hemoglobin: 7.8 g/dL — CL (ref 13.0–17.0)
Lymphocytes Relative: 32.8 % (ref 12.0–46.0)
MCHC: 31.5 g/dL (ref 30.0–36.0)
Monocytes Absolute: 0.5 10*3/uL (ref 0.1–1.0)
Monocytes Relative: 13.7 % — ABNORMAL HIGH (ref 3.0–12.0)
Neutro Abs: 1.9 10*3/uL (ref 1.4–7.7)
Platelets: 72 10*3/uL — ABNORMAL LOW (ref 150.0–400.0)
RDW: 20.4 % — ABNORMAL HIGH (ref 11.5–14.6)

## 2011-12-15 LAB — BASIC METABOLIC PANEL
CO2: 23 mEq/L (ref 19–32)
Chloride: 108 mEq/L (ref 96–112)
Creatinine, Ser: 0.7 mg/dL (ref 0.4–1.5)
Glucose, Bld: 90 mg/dL (ref 70–99)
Potassium: 5 mEq/L (ref 3.5–5.1)
Sodium: 139 mEq/L (ref 135–145)

## 2011-12-15 LAB — HEPATIC FUNCTION PANEL
ALT: 39 U/L (ref 0–53)
AST: 49 U/L — ABNORMAL HIGH (ref 0–37)
Albumin: 3.3 g/dL — ABNORMAL LOW (ref 3.5–5.2)
Alkaline Phosphatase: 123 U/L — ABNORMAL HIGH (ref 39–117)
Bilirubin, Direct: 0.4 mg/dL — ABNORMAL HIGH (ref 0.0–0.3)
Total Protein: 5.8 g/dL — ABNORMAL LOW (ref 6.0–8.3)

## 2011-12-15 LAB — FERRITIN: Ferritin: 7.7 ng/mL — ABNORMAL LOW (ref 22.0–322.0)

## 2011-12-15 NOTE — Telephone Encounter (Signed)
Patient wants to have his testosterone levels drawn.  He is advised he should discuss this with his primary care.  He was hoping to have this drawn today when he comes for labs.  He is advised that Dr. Leone Payor is out of the office and not able to get an order today.  He says he will discuss with his primary care at his appt next week

## 2011-12-15 NOTE — Telephone Encounter (Signed)
Received CBC results with Hgb of 7.8. Patient denies bleeding, black stools, SOB. States he worked out yesterday and has not compliants. Spoke with Willette Cluster, NP about patient. Patient notified that if he develops SOB, dizziness, bleeding, black  Stools or has any vomiting he is to go to ED immediately. Patient understands this.

## 2011-12-17 NOTE — Telephone Encounter (Signed)
Patient has follow up with Dr. Leone Payor on 12/22/11, will forward this to him.

## 2011-12-20 NOTE — Telephone Encounter (Signed)
I have left a message for the patient to call back and to come for labs prior to his appt on 7/10

## 2011-12-20 NOTE — Telephone Encounter (Signed)
He needs a repeat Hgb today or tomorrow so I have it before the visit

## 2011-12-20 NOTE — Addendum Note (Signed)
Addended by: Annett Fabian on: 12/20/2011 08:50 AM   Modules accepted: Orders

## 2011-12-22 ENCOUNTER — Other Ambulatory Visit (INDEPENDENT_AMBULATORY_CARE_PROVIDER_SITE_OTHER): Payer: Medicaid Other

## 2011-12-22 ENCOUNTER — Other Ambulatory Visit: Payer: Self-pay | Admitting: *Deleted

## 2011-12-22 ENCOUNTER — Other Ambulatory Visit: Payer: Medicaid Other

## 2011-12-22 ENCOUNTER — Encounter: Payer: Self-pay | Admitting: Internal Medicine

## 2011-12-22 ENCOUNTER — Ambulatory Visit (INDEPENDENT_AMBULATORY_CARE_PROVIDER_SITE_OTHER): Payer: Medicaid Other | Admitting: Internal Medicine

## 2011-12-22 VITALS — BP 124/68 | HR 72 | Ht 71.0 in | Wt 235.0 lb

## 2011-12-22 DIAGNOSIS — K7030 Alcoholic cirrhosis of liver without ascites: Secondary | ICD-10-CM

## 2011-12-22 DIAGNOSIS — R5383 Other fatigue: Secondary | ICD-10-CM

## 2011-12-22 DIAGNOSIS — D509 Iron deficiency anemia, unspecified: Secondary | ICD-10-CM

## 2011-12-22 DIAGNOSIS — R198 Other specified symptoms and signs involving the digestive system and abdomen: Secondary | ICD-10-CM

## 2011-12-22 DIAGNOSIS — B20 Human immunodeficiency virus [HIV] disease: Secondary | ICD-10-CM

## 2011-12-22 DIAGNOSIS — R1900 Intra-abdominal and pelvic swelling, mass and lump, unspecified site: Secondary | ICD-10-CM

## 2011-12-22 LAB — CBC WITH DIFFERENTIAL/PLATELET
Basophils Absolute: 0 10*3/uL (ref 0.0–0.1)
Eosinophils Absolute: 0.1 10*3/uL (ref 0.0–0.7)
Eosinophils Relative: 2 % (ref 0–5)
Hemoglobin: 8.8 g/dL — ABNORMAL LOW (ref 13.0–17.0)
Lymphocytes Relative: 23 % (ref 12.0–46.0)
Lymphocytes Relative: 27 % (ref 12–46)
Lymphs Abs: 0.9 10*3/uL (ref 0.7–4.0)
MCHC: 31.4 g/dL (ref 30.0–36.0)
MCV: 72.5 fl — ABNORMAL LOW (ref 78.0–100.0)
MCV: 70.5 fL — ABNORMAL LOW (ref 78.0–100.0)
Monocytes Relative: 12 % (ref 3–12)
Neutro Abs: 1.9 10*3/uL (ref 1.7–7.7)
Neutrophils Relative %: 63 % (ref 43.0–77.0)
Neutrophils Relative %: 58 % (ref 43–77)
Platelets: 89 10*3/uL — ABNORMAL LOW (ref 150–400)
RBC: 3.9 MIL/uL — ABNORMAL LOW (ref 4.22–5.81)
RDW: 21.1 % — ABNORMAL HIGH (ref 11.5–14.6)
RDW: 19.5 % — ABNORMAL HIGH (ref 11.5–15.5)
WBC: 3.3 10*3/uL — ABNORMAL LOW (ref 4.0–10.5)

## 2011-12-22 MED ORDER — MOVIPREP 100 G PO SOLR: ORAL | Status: DC

## 2011-12-22 MED ORDER — EFAVIRENZ-EMTRICITAB-TENOFOVIR 600-200-300 MG PO TABS: 1.0000 | ORAL_TABLET | Freq: Every day | ORAL | Status: DC

## 2011-12-22 NOTE — Progress Notes (Signed)
  Subjective:    Patient ID: [name], male    DOB: 21-Mar-1966, 46 y.o.   MRN: [id number]  HPI Returns for follow-up of cirrhosis in setting of HIV disease. He is concerned about weight and abdominal girth. There is no more painful gynecomastia on amiloride. No edema. No confusion. Has had exacerbation of anemia with Hgb 7.8 but no signs of bleeding. Was not taking his iron - once told he was anemic he restarted it. He is active and exercising with swimming and using machines at the gym.  No current partner or intercourse.  Medications, allergies, past medical history, past surgical history, family history and social history are reviewed and updated in the EMR.  Review of Systems No fevers    Objective:   Physical Exam General:  NAD Eyes:   anicteric Lungs:  Clear Breasts:  No gynecomastia Heart:  S1S2 no rubs, murmurs or gallops Abdomen:  Obese, soft soft and nontender, BS+ no hsm ? ASCITES Ext:   no edema Skin:   + spiders angiomata, tanned Neuro:  A and o x 3 no flap   Data Reviewed:  Hgb 7.8 last week     Assessment & Plan:   1. 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 Colonoscopy to look for other causes of blood loss is reasonable. The risks and benefits as well as alternatives of endoscopic procedure(s) have been discussed and reviewed. All questions answered. The patient agrees to proceed.   CBC with Differential, colonoscopy  2. Alcoholic cirrhosis of liver   Seems stable, ? ascites Check AFP  3. Increased abdominal girth   ? Obesity vs. ascites US Abdomen Complete   JY:[email or web address],OZHYQM, MD

## 2011-12-22 NOTE — Patient Instructions (Addendum)
You have been scheduled for a colonoscopy with propofol. Please follow written instructions given to you at your visit today.  Please use the Moviprep Kit we have given you today. If you use inhalers (even only as needed), please bring them with you on the day of your procedure.  Your physician has requested that you go to the basement for the following lab work before leaving today: CBC, AFP  You have been scheduled for an abdominal ultrasound at [facility] (1st floor of hospital) on 12/28/11 at 8:30am. Please arrive 15 minutes prior to your appointment for registration. Make certain not to have anything to eat or drink 6 hours prior to your appointment. Should you need to reschedule your appointment, please contact radiology at [phone].  Thank you for choosing  GI for your healthcare needs today.     LM for his pharmacy in [place] to cancel the moviprep order b/c we supplied him with a kit today.

## 2011-12-22 NOTE — Telephone Encounter (Signed)
Xanax refill sent on 12/03/11 to the [facility] in [place] with 3 refills. Phone call to pt to let him know that rx was previously sent to the [facility] with refills and that Atripla rf was sent to [facility] in [place].  Pt verbalized understanding.

## 2011-12-23 ENCOUNTER — Telehealth: Payer: Self-pay | Admitting: Internal Medicine

## 2011-12-23 ENCOUNTER — Other Ambulatory Visit: Payer: Medicaid Other

## 2011-12-23 LAB — COMPLETE METABOLIC PANEL WITH GFR
ALT: 34 U/L (ref 0–53)
AST: 58 U/L — ABNORMAL HIGH (ref 0–37)
Albumin: 3.6 g/dL (ref 3.5–5.2)
Alkaline Phosphatase: 121 U/L — ABNORMAL HIGH (ref 39–117)
BUN: 11 mg/dL (ref 6–23)
Calcium: 8.4 mg/dL (ref 8.4–10.5)
Chloride: 109 mEq/L (ref 96–112)
GFR, Est Non African American: >89 mL/min
Glucose, Bld: 84 mg/dL (ref 70–99)
Potassium: 4.9 mEq/L (ref 3.5–5.3)
Sodium: 138 mEq/L (ref 135–145)
Total Bilirubin: 1.7 mg/dL — ABNORMAL HIGH (ref 0.3–1.2)
Total Protein: 5.9 g/dL — ABNORMAL LOW (ref 6.0–8.3)

## 2011-12-23 LAB — TESTOSTERONE: Testosterone: 637.54 ng/dL (ref 300–890)

## 2011-12-23 LAB — T-HELPER CELL (CD4) - (RCID CLINIC ONLY)
CD4 % Helper T Cell: 25 % — ABNORMAL LOW (ref 33–55)
CD4 T Cell Abs: 240 uL — ABNORMAL LOW (ref 400–2700)

## 2011-12-23 LAB — AFP TUMOR MARKER: AFP-Tumor Marker: 2.1 ng/mL (ref 0.0–8.0)

## 2011-12-23 NOTE — Telephone Encounter (Signed)
Pt informed that he may continue his Generlac prior to his procedure Friday.  He said he will see [place] in the Am.

## 2011-12-23 NOTE — Telephone Encounter (Signed)
Please let me know if the answer to the question is yes or no, thank you!

## 2011-12-24 ENCOUNTER — Other Ambulatory Visit: Payer: Self-pay

## 2011-12-24 ENCOUNTER — Encounter: Payer: Self-pay | Admitting: Internal Medicine

## 2011-12-24 ENCOUNTER — Ambulatory Visit (AMBULATORY_SURGERY_CENTER): Payer: Medicaid Other | Admitting: Internal Medicine

## 2011-12-24 VITALS — BP 137/73 | HR 72 | Temp 96.7°F | Resp 16 | Ht 71.0 in | Wt 235.0 lb

## 2011-12-24 DIAGNOSIS — K648 Other hemorrhoids: Secondary | ICD-10-CM

## 2011-12-24 DIAGNOSIS — D509 Iron deficiency anemia, unspecified: Secondary | ICD-10-CM

## 2011-12-24 DIAGNOSIS — D649 Anemia, unspecified: Secondary | ICD-10-CM

## 2011-12-24 LAB — HIV-1 RNA QUANT-NO REFLEX-BLD: HIV 1 RNA Quant: <20 copies/mL (ref <20)

## 2011-12-24 MED ORDER — SODIUM CHLORIDE 0.9 % IV SOLN: 500.0000 mL | INTRAVENOUS | Status: DC

## 2011-12-24 MED ORDER — FUROSEMIDE 40 MG PO TABS: 40.0000 mg | ORAL_TABLET | Freq: Two times a day (BID) | ORAL | Status: DC

## 2011-12-24 MED ORDER — FERROUS SULFATE 325 (65 FE) MG PO TABS: 325.0000 mg | ORAL_TABLET | Freq: Two times a day (BID) | ORAL | Status: DC

## 2011-12-24 NOTE — Op Note (Signed)
[facility] [address] [place], [place]  [address]  COLONOSCOPY PROCEDURE REPORT  PATIENT:  Carl, [name]  MR#:  [id number] BIRTHDATE:  11/07/1965, 46 yrs. old  GENDER:  male ENDOSCOPIST:  Iva Boop, MD, [facility]  PROCEDURE DATE:  12/24/2011 PROCEDURE:  Colonoscopy 08657 ASA CLASS:  Class III INDICATIONS:  Iron deficiency anemia MEDICATIONS:   MAC sedation, administered by CRNA, propofol (Diprivan) 120 mg IV  DESCRIPTION OF PROCEDURE:   After the risks benefits and alternatives of the procedure were thoroughly explained, informed consent was obtained.  Digital rectal exam was performed and revealed no abnormalities and normal prostate.   The LB [id number] endoscope was introduced through the anus and advanced to the cecum, which was identified by both the appendix and ileocecal valve, without limitations.  The quality of the prep was excellent, using MoviPrep.  The instrument was then slowly withdrawn as the colon was fully examined. <<PROCEDUREIMAGES>>  FINDINGS:  A normal appearing cecum, ileocecal valve, and appendiceal orifice were identified. The ascending, hepatic flexure, transverse, splenic flexure, descending, sigmoid colon, and rectum appeared unremarkable except for some prominent vascular pattern in right colon.   Retroflexed views in the rectum revealed internal hemorrhoids.    The time to cecum = 4:22 minutes. The scope was then withdrawn in 12:05 minutes from the cecum and the procedure completed. COMPLICATIONS:  None ENDOSCOPIC IMPRESSION: 1) Internal hemorrhoids 2) Normal colonoscopy otherwise (except prominent vascular pattern in right colon - ? portal colopathy) RECOMMENDATIONS: 1) Continue iron 2) CBC 1 month 3) Will call with ultrasound results (has appointment 7/16) 4) NOTE - awakens slowly from propofol  Iva Boop, MD, [facility]  CC:  Alinda Deem, MD and The Patient  n. eSIGNED:   Iva Boop at 12/24/2011 08:55  AM  Rachael Fee, [phone]

## 2011-12-24 NOTE — Patient Instructions (Addendum)
The colonoscopy was ok! No problems seen except for possible increased blood vessels in the lining of right colon and small internal hemorrhoids. Can be seen with cirrhosis. I want you to get a blood count again (CBC) in 1 month - we will arrange and let you know through the office. You will hear from [place] with ultrasound results when done next week.  Iva Boop, MD, FACG YOU HAD AN ENDOSCOPIC PROCEDURE TODAY AT THE [facility]: Refer to the procedure report that was given to you for any specific questions about what was found during the examination.  If the procedure report does not answer your questions, please call your gastroenterologist to clarify.  If you requested that your care partner not be given the details of your procedure findings, then the procedure report has been included in a sealed envelope for you to review at your convenience later.  YOU SHOULD EXPECT: Some feelings of bloating in the abdomen. Passage of more gas than usual.  Walking can help get rid of the air that was put into your GI tract during the procedure and reduce the bloating. If you had a lower endoscopy (such as a colonoscopy or flexible sigmoidoscopy) you may notice spotting of blood in your stool or on the toilet paper. If you underwent a bowel prep for your procedure, then you may not have a normal bowel movement for a few days.  DIET: Your first meal following the procedure should be a light meal and then it is ok to progress to your normal diet.  A half-sandwich or bowl of soup is an example of a good first meal.  Heavy or fried foods are harder to digest and may make you feel nauseous or bloated.  Likewise meals heavy in dairy and vegetables can cause extra gas to form and this can also increase the bloating.  Drink plenty of fluids but you should avoid alcoholic beverages for 24 hours.  ACTIVITY: Your care partner should take you home directly after the procedure.  You should plan to take it easy,  moving slowly for the rest of the day.  You can resume normal activity the day after the procedure however you should NOT DRIVE or use heavy machinery for 24 hours (because of the sedation medicines used during the test).    SYMPTOMS TO REPORT IMMEDIATELY: A gastroenterologist can be reached at any hour.  During normal business hours, 8:30 AM to 5:00 PM Monday through Friday, call [phone].  After hours and on weekends, please call the GI answering service at [phone] who will take a message and have the physician on call contact you.   Following lower endoscopy (colonoscopy or flexible sigmoidoscopy):  Excessive amounts of blood in the stool  Significant tenderness or worsening of abdominal pains  Swelling of the abdomen that is new, acute  Fever of 100F or higher  FOLLOW UP: If any biopsies were taken you will be contacted by phone or by letter within the next 1-3 weeks.  Call your gastroenterologist if you have not heard about the biopsies in 3 weeks.  Our staff will call the home number listed on your records the next business day following your procedure to check on you and address any questions or concerns that you may have at that time regarding the information given to you following your procedure. This is a courtesy call and so if there is no answer at the home number and we have not heard from you  through the emergency physician on call, we will assume that you have returned to your regular daily activities without incident.  SIGNATURES/CONFIDENTIALITY: You and/or your care partner have signed paperwork which will be entered into your electronic medical record.  These signatures attest to the fact that that the information above on your After Visit Summary has been reviewed and is understood.  Full responsibility of the confidentiality of this discharge information lies with you and/or your care-partner.

## 2011-12-24 NOTE — Progress Notes (Signed)
Patient did not have preoperative order for IV antibiotic SSI prophylaxis. (G8918)  Patient did not experience any of the following events: a burn prior to discharge; a fall within the facility; wrong site/side/patient/procedure/implant event; or a hospital transfer or hospital admission upon discharge from the facility. (G8907)  

## 2011-12-27 ENCOUNTER — Telehealth: Payer: Self-pay | Admitting: *Deleted

## 2011-12-27 NOTE — Telephone Encounter (Signed)
  Follow up Call-  Call back number 12/24/2011  Post procedure Call Back phone  # [phone]  Permission to leave phone message Yes     Patient questions:  Message left to call if necessary.

## 2011-12-28 ENCOUNTER — Other Ambulatory Visit (HOSPITAL_COMMUNITY): Payer: Medicaid Other

## 2011-12-31 ENCOUNTER — Telehealth: Payer: Self-pay | Admitting: Internal Medicine

## 2011-12-31 MED ORDER — POTASSIUM CHLORIDE 20 MEQ PO PACK: 20.0000 meq | PACK | Freq: Every day | ORAL | Status: DC

## 2011-12-31 NOTE — Telephone Encounter (Signed)
Left message for patient to call back  

## 2011-12-31 NOTE — Telephone Encounter (Signed)
Patient needs a refill on potassium sent to his pharmacy.  I have sent the RX.  He wanted [place] to be aware he had to reschedule his [place] to next Friday.  He will call back for any questions or concerns

## 2012-01-07 ENCOUNTER — Ambulatory Visit (HOSPITAL_COMMUNITY)
Admission: RE | Admit: 2012-01-07 | Discharge: 2012-01-07 | Disposition: A | Discharge status: Home / Self Care | Payer: Medicaid Other | Source: Ambulatory Visit | Attending: Internal Medicine | Admitting: Internal Medicine

## 2012-01-07 ENCOUNTER — Encounter: Payer: Self-pay | Admitting: Infectious Diseases

## 2012-01-07 ENCOUNTER — Ambulatory Visit (INDEPENDENT_AMBULATORY_CARE_PROVIDER_SITE_OTHER): Payer: Medicaid Other | Admitting: Infectious Diseases

## 2012-01-07 VITALS — BP 124/76 | HR 69 | Temp 97.6°F | Ht 71.0 in | Wt 236.8 lb

## 2012-01-07 DIAGNOSIS — R161 Splenomegaly, not elsewhere classified: Secondary | ICD-10-CM | POA: Insufficient documentation

## 2012-01-07 DIAGNOSIS — R141 Gas pain: Secondary | ICD-10-CM | POA: Insufficient documentation

## 2012-01-07 DIAGNOSIS — R142 Eructation: Secondary | ICD-10-CM | POA: Insufficient documentation

## 2012-01-07 DIAGNOSIS — R198 Other specified symptoms and signs involving the digestive system and abdomen: Secondary | ICD-10-CM

## 2012-01-07 DIAGNOSIS — K7689 Other specified diseases of liver: Secondary | ICD-10-CM | POA: Insufficient documentation

## 2012-01-07 DIAGNOSIS — B20 Human immunodeficiency virus [HIV] disease: Secondary | ICD-10-CM

## 2012-01-07 NOTE — Progress Notes (Signed)
Patient ID: [name], male   DOB: 02-Apr-1966, 46 y.o.   MRN: [id number] HIV f/u Visit  [name] is doing very well from an HIV perspective with HIV suppression to undectabel levels and CD4 count > 1000. He recently underwent panendoscopy for an iron deficiency anemia recently discovered without any obvious bleeding site identified. He has not lost any weight and feels reasonably well. He does have cirrhosis and portal hypertension so many possible explanations for iron  Loss. His HGB is in 10+ range and could have element of liver disease related anemia. He has platelet counts in 60-80K range and all compatible with portal HTN. He has had no fevers, spontaneous bleeding, or easy bruising. 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 His HIV is well contolled. Exam BP 124/76  Pulse 69  Temp 97.6 F (36.4 C) (Oral)  Ht 5\' 11"  (1.803 m)  Wt 236 lb 12 oz (107.389 kg)  BMI 33.02 kg/m2 Healthy in appearance and no jaundice visible. No bruises or peticechiae. Abdomen only mildly distended and no palpable liver or spleen. Impression HIV stable and continue Atripla which he is tolerating well. Cirrhosis and portal HTN stable. Other problems stable. Plan f/u HIV in four months and obtain monitoring labs week before visit. [name]

## 2012-01-11 NOTE — Progress Notes (Signed)
Quick Note:  No ascites (fluid0 in belly  Unfortunately the girth and weight changes are from more pounds.  Sometimes HIV meds may cause this - he can check with ID doc about this when follows-up  Am willing to arrange dietitian appointment if he wants and insurance covers  Re: weight gain, obesity in setting of liver disease and HIV ______

## 2012-02-01 ENCOUNTER — Other Ambulatory Visit (INDEPENDENT_AMBULATORY_CARE_PROVIDER_SITE_OTHER): Payer: Medicaid Other

## 2012-02-01 DIAGNOSIS — D649 Anemia, unspecified: Secondary | ICD-10-CM

## 2012-02-01 LAB — CBC WITH DIFFERENTIAL/PLATELET
Hemoglobin: 11.4 g/dL — ABNORMAL LOW (ref 13.0–17.0)
MCHC: 31.2 g/dL (ref 30.0–36.0)
RBC: 4.52 Mil/uL (ref 4.22–5.81)
RDW: 27.9 % — ABNORMAL HIGH (ref 11.5–14.6)

## 2012-02-02 NOTE — Progress Notes (Signed)
Quick Note:  Hgb much better at 11.7  Stay on the ferrous sulfate  I see he has a CBC scheduled by ID for November so wait for that  See me in January, sooner prn ______

## 2012-02-15 ENCOUNTER — Telehealth: Payer: Self-pay | Admitting: Internal Medicine

## 2012-02-15 NOTE — Telephone Encounter (Signed)
Patient advised to add a stool softener or Miralax to help with constipation

## 2012-02-16 ENCOUNTER — Other Ambulatory Visit: Payer: Self-pay | Admitting: *Deleted

## 2012-02-16 DIAGNOSIS — G4700 Insomnia, unspecified: Secondary | ICD-10-CM

## 2012-02-16 MED ORDER — AMITRIPTYLINE HCL 50 MG PO TABS: 50.0000 mg | ORAL_TABLET | Freq: Every day | ORAL | Status: DC

## 2012-03-07 ENCOUNTER — Other Ambulatory Visit: Payer: Self-pay

## 2012-03-07 MED ORDER — FUROSEMIDE 40 MG PO TABS: 40.0000 mg | ORAL_TABLET | Freq: Two times a day (BID) | ORAL | Status: DC

## 2012-03-09 ENCOUNTER — Telehealth: Payer: Self-pay | Admitting: *Deleted

## 2012-03-09 NOTE — Telephone Encounter (Signed)
Patient called and advised he has recently had a spot of skin cancer removed from his arm and is worried it is due to his HIV. He advised the dermatologist told him it was to most common type and is due to normal sun exposure he his still very worried and wants to see Dr Maurice March earlier. He also expressed that he had lab that worried him at his last draw and would like to come in and check on things. Moved his appts up earlier as he was very concerned and there were available appts. Gave him 03/23/12 @ 1015 for labs and 04/07/12 @ 10 am for Dr Maurice March.

## 2012-03-20 ENCOUNTER — Other Ambulatory Visit: Payer: Self-pay

## 2012-03-20 DIAGNOSIS — K7460 Unspecified cirrhosis of liver: Secondary | ICD-10-CM

## 2012-03-20 MED ORDER — AMILORIDE HCL 5 MG PO TABS: ORAL_TABLET | ORAL | Status: DC

## 2012-03-23 ENCOUNTER — Other Ambulatory Visit: Payer: Medicaid Other

## 2012-03-23 DIAGNOSIS — Z113 Encounter for screening for infections with a predominantly sexual mode of transmission: Secondary | ICD-10-CM

## 2012-03-23 DIAGNOSIS — B20 Human immunodeficiency virus [HIV] disease: Secondary | ICD-10-CM

## 2012-03-23 LAB — CBC WITH DIFFERENTIAL/PLATELET
Basophils Absolute: 0 10*3/uL (ref 0.0–0.1)
Basophils Relative: 1 % (ref 0–1)
Eosinophils Relative: 4 % (ref 0–5)
Lymphocytes Relative: 31 % (ref 12–46)
Lymphs Abs: 1.1 10*3/uL (ref 0.7–4.0)
MCH: 26.8 pg (ref 26.0–34.0)
MCV: 81.8 fL (ref 78.0–100.0)
Monocytes Absolute: 0.3 10*3/uL (ref 0.1–1.0)
Neutro Abs: 2 10*3/uL (ref 1.7–7.7)
Platelets: 69 10*3/uL — ABNORMAL LOW (ref 150–400)
RDW: 18 % — ABNORMAL HIGH (ref 11.5–15.5)
WBC: 3.6 10*3/uL — ABNORMAL LOW (ref 4.0–10.5)

## 2012-03-23 LAB — COMPREHENSIVE METABOLIC PANEL
ALT: 41 U/L (ref 0–53)
AST: 55 U/L — ABNORMAL HIGH (ref 0–37)
Albumin: 3.7 g/dL (ref 3.5–5.2)
Alkaline Phosphatase: 142 U/L — ABNORMAL HIGH (ref 39–117)
CO2: 25 mEq/L (ref 19–32)
Calcium: 8.9 mg/dL (ref 8.4–10.5)
Chloride: 107 mEq/L (ref 96–112)
Creat: 0.69 mg/dL (ref 0.50–1.35)
Potassium: 4.3 mEq/L (ref 3.5–5.3)
Sodium: 141 mEq/L (ref 135–145)
Total Protein: 6.2 g/dL (ref 6.0–8.3)

## 2012-03-23 LAB — RPR

## 2012-03-24 LAB — HIV-1 RNA QUANT-NO REFLEX-BLD: HIV-1 RNA Quant, Log: <1.3 {Log} (ref <1.30)

## 2012-03-24 LAB — T-HELPER CELL (CD4) - (RCID CLINIC ONLY): CD4 % Helper T Cell: 22 % — ABNORMAL LOW (ref 33–55)

## 2012-03-31 ENCOUNTER — Encounter: Payer: Self-pay | Admitting: Internal Medicine

## 2012-04-07 ENCOUNTER — Ambulatory Visit: Payer: Medicaid Other | Admitting: Infectious Diseases

## 2012-04-10 ENCOUNTER — Telehealth: Payer: Self-pay | Admitting: *Deleted

## 2012-04-10 ENCOUNTER — Other Ambulatory Visit: Payer: Self-pay | Admitting: *Deleted

## 2012-04-10 DIAGNOSIS — F419 Anxiety disorder, unspecified: Secondary | ICD-10-CM

## 2012-04-10 MED ORDER — ALPRAZOLAM 1 MG PO TABS: 1.0000 mg | ORAL_TABLET | Freq: Two times a day (BID) | ORAL | Status: DC | PRN

## 2012-04-10 NOTE — Telephone Encounter (Signed)
Patient interested in seeing a nutritionist once it is started at this clinic. [name] CMA

## 2012-04-12 ENCOUNTER — Other Ambulatory Visit: Payer: Self-pay | Admitting: Infectious Diseases

## 2012-04-12 DIAGNOSIS — E663 Overweight: Secondary | ICD-10-CM

## 2012-04-12 NOTE — Telephone Encounter (Signed)
Patient referred to [facility] dietician for 04/17/12 at 11:30 AM [name]

## 2012-04-17 ENCOUNTER — Ambulatory Visit: Payer: Medicaid Other | Admitting: *Deleted

## 2012-04-17 ENCOUNTER — Encounter: Payer: Medicaid Other | Attending: Infectious Diseases | Admitting: *Deleted

## 2012-04-17 DIAGNOSIS — Z713 Dietary counseling and surveillance: Secondary | ICD-10-CM | POA: Insufficient documentation

## 2012-04-17 DIAGNOSIS — E669 Obesity, unspecified: Secondary | ICD-10-CM | POA: Insufficient documentation

## 2012-04-17 NOTE — Progress Notes (Signed)
  Medical Nutrition Therapy:  Appt start time: 1130 end time:  1230.  Assessment:  Obesity/Weight Management.   MEDICATIONS: See medication list. Not taking B1 (Thiamin) supplement. Reports taking MVI daily.    DIETARY INTAKE:  Usual eating pattern includes 3 meals and 0 snacks per day.  24-hr recall:  B ( AM): Boiled egg or bowl grapenuts cereal with skim milk  Snk ( AM): None  L ( PM): Sandwich Snk ( PM): None D ( PM): Grilled salmon or tilapia on pasta with olive oil and vegetables Snk ( PM): None Beverages: water, unsweet tea  Usual physical activity: Swims 3-4 days/wk for 1.25 hrs; planning to resume lifting light weights soon  Estimated energy needs: 1800 calories (16-17 cal/kg) 200-225 g carbohydrates (50%) 100-110 g protein (25%) 50 g fat (25%)  Progress Towards Goal(s):  In progress.   Nutritional Diagnosis:  [place]-3.3 Overweight/obesity related to excessive CHO intake and decreased physical activity as evidenced by patient-reported food intake and a weight gain of 30 lbs in the last year.    Intervention:  Nutrition education.  Handouts given during visit include:  Meal Planning card  Low CHO Snack List  HTN/Chol handouts  Monitoring/Evaluation:  Dietary intake, exercise, and body weight prn.

## 2012-04-18 ENCOUNTER — Other Ambulatory Visit: Payer: Self-pay | Admitting: Internal Medicine

## 2012-04-18 NOTE — Patient Instructions (Signed)
Goals:  Eat 3 meals/day, Avoid meal skipping   Increase protein rich foods  Limit carbohydrate  servings/meal   Choose more whole grains, lean protein, low-fat dairy, and fruits/non-starchy vegetables.   Aim for >30 min of physical activity daily  Limit sugar-sweetened beverages and concentrated sweets  Estimated energy needs: 1800 calories (16-17 cal/kg) 200-225 g carbohydrates (50%) 100-110 g protein (25%) 50 g fat (25%)

## 2012-04-21 ENCOUNTER — Encounter: Payer: Self-pay | Admitting: Infectious Diseases

## 2012-04-21 ENCOUNTER — Encounter: Payer: Self-pay | Admitting: *Deleted

## 2012-04-21 ENCOUNTER — Ambulatory Visit (INDEPENDENT_AMBULATORY_CARE_PROVIDER_SITE_OTHER): Payer: Medicaid Other | Admitting: Infectious Diseases

## 2012-04-21 VITALS — BP 135/84 | HR 79 | Temp 97.9°F | Wt 249.0 lb

## 2012-04-21 DIAGNOSIS — G4700 Insomnia, unspecified: Secondary | ICD-10-CM

## 2012-04-21 DIAGNOSIS — Z23 Encounter for immunization: Secondary | ICD-10-CM

## 2012-04-21 DIAGNOSIS — B20 Human immunodeficiency virus [HIV] disease: Secondary | ICD-10-CM

## 2012-04-21 NOTE — Patient Instructions (Signed)
Please return in 4 months labs 2 weeks prior

## 2012-04-21 NOTE — Patient Instructions (Signed)
  Medical Nutrition Therapy:  Appt start time: 1130 end time:  1230.  Assessment: Obesity/Weight Mgmt.  Dietary intake very good overall. Likely not eating enough calories for the exercise he is doing. Excessive CHO intake noted at times. No sweetened beverages or foods reported.  Not taking daily MVI   MEDICATIONS: See medication list.    DIETARY INTAKE:  Usual eating pattern includes 3 meals and 0 snacks per day.  24-hr recall:  B ( AM): Boiled egg or bowl of Grapenuts cereal with skim milk  Snk ( AM): None  L ( PM): Sandwich Snk ( PM): None D ( PM): Grilled Salmon or Tilapia on pasta (mixed with olive oil/veggies) Snk ( PM): None Beverages: Water, unsweetened iced tea  Usual physical activity: Swims @ [employer] 3-4 times/week for 75 min ea.   Estimated energy needs: 1800-2000 calories 225-250 g carbohydrates 110-120 g protein 50-55 g fat  Progress Towards Goal(s):  In progress.   Nutritional Diagnosis:  [place]-3.3 Overweight/obesity As related to excessive CHO intake.  As evidenced by patient reported dietary intake history and a BMI of  .    Intervention:  Nutrition education.  Patient Goals:   Monitoring/Evaluation:  Dietary intake, exercise, and body weight prn.

## 2012-04-22 ENCOUNTER — Encounter: Payer: Self-pay | Admitting: *Deleted

## 2012-04-22 NOTE — Progress Notes (Addendum)
Medical Nutrition Therapy: Appt start time: 1130 end time: 1230.   Assessment: Obesity/Weight Mgmt.  Dietary intake very good overall. Likely not eating enough calories for the exercise he is doing. Excessive CHO intake noted at times. No sweetened beverages or foods reported. Not taking daily MVI.   MEDICATIONS: See medication list.   DIETARY INTAKE: Usual eating pattern includes 3 meals and 0 snacks per day.  24-hr recall:  B ( AM): Boiled egg or bowl of Grapenuts cereal with skim milk  Snk ( AM): None  L ( PM): Sandwich  Snk ( PM): None  D ( PM): Grilled Salmon or Tilapia on pasta (mixed with olive oil/veggies)  Snk ( PM): None  Beverages: Water, unsweetened iced tea   Usual physical activity: Swims @ [employer] 3-4 times/week for 75 min/day.   Estimated energy needs:  1800-2000 calories (to adjust as needed)  225-250 g carbohydrates  110-120 g protein  50-55 g fat   Progress Towards Goal(s): In progress.   Nutritional Diagnosis:  [place]-3.3 Overweight/obesity related to excessive CHO intake as evidenced by patient reported dietary intake history and a BMI of 34 kg/m^2.   Intervention: Nutrition education. Discussed metabolism of carbohydrates and appropriate food choices. Also discussed importance of vitamins and minerals and the appropriate energy intake for his exercise level.   Patient Goals:   Follow nutrition prescription as indicated.  Spread carbohydrates evenly over meals.  Have protein with all carbohydrates.    Continue exercise regimen.   Add a snack before your workout to fuel body and prevent extreme hunger afterwards.  Avoid fried, high fat foods and concentrated sweets/sugary beverages.  Aim for 25-30 grams of dietary fiber daily.  Monitoring/Evaluation: Dietary intake, exercise, and body weight prn.

## 2012-04-24 ENCOUNTER — Other Ambulatory Visit: Payer: Self-pay | Admitting: Infectious Diseases

## 2012-04-24 DIAGNOSIS — Z79899 Other long term (current) drug therapy: Secondary | ICD-10-CM

## 2012-04-25 ENCOUNTER — Other Ambulatory Visit: Payer: Self-pay | Admitting: Internal Medicine

## 2012-04-25 NOTE — Telephone Encounter (Signed)
Called [facility] and gave them a verbal ok on the Lactulose since it didn't go thru the epic system correctly today.

## 2012-05-01 ENCOUNTER — Telehealth: Payer: Self-pay | Admitting: Internal Medicine

## 2012-05-01 ENCOUNTER — Other Ambulatory Visit: Payer: Self-pay | Admitting: Infectious Diseases

## 2012-05-02 ENCOUNTER — Other Ambulatory Visit: Payer: Self-pay

## 2012-05-02 MED ORDER — FUROSEMIDE 40 MG PO TABS: 40.0000 mg | ORAL_TABLET | Freq: Two times a day (BID) | ORAL | Status: DC

## 2012-05-02 MED ORDER — ESOMEPRAZOLE MAGNESIUM 40 MG PO CPDR: 40.0000 mg | DELAYED_RELEASE_CAPSULE | Freq: Every day | ORAL | Status: DC

## 2012-05-02 NOTE — Telephone Encounter (Signed)
Spoke with patients mother, she said Mohamad needed refill on his Nexium.  Told her we would send it in and that check his schedule and call back for January appointment.

## 2012-05-02 NOTE — Telephone Encounter (Signed)
Per Dr. Leone Payor ok to refill 3 times, and pt needs appt. In Jan., pharmacy to let pt know.

## 2012-05-04 ENCOUNTER — Other Ambulatory Visit: Payer: Self-pay | Admitting: Internal Medicine

## 2012-05-04 MED ORDER — ESOMEPRAZOLE MAGNESIUM 40 MG PO CPDR: 40.0000 mg | DELAYED_RELEASE_CAPSULE | Freq: Every day | ORAL | Status: DC

## 2012-05-04 NOTE — Telephone Encounter (Signed)
[name]'s Nexium was sent to [facility] in [place] on 05/02/12.  He had wanted it to go to [facility] in [place]  so re-sent it there and called the [facility] and cancelled that one. Patient aware.  I will also pass on his message to Dr. Leone Payor that Dr. Maurice March said he is doing well.

## 2012-05-05 ENCOUNTER — Telehealth: Payer: Self-pay | Admitting: Internal Medicine

## 2012-05-05 NOTE — Telephone Encounter (Signed)
Spoke with patient, says [facility] in [place] does not have his Nexium rx.  Was sent in yesterday.  And I just called and left it on there voice mail as you are unable to speak to the pharmacy staff.

## 2012-05-05 NOTE — Telephone Encounter (Signed)
[name] faxed over PA needed for Nexium .  Tried to call for this after today's clinic and they were closed.  Informed patient of status on his Nexium.  Per Dr. Leone Payor patient should try Prilosec OTC taking 2 tabs each day.  I will try to call again 05/08/12 for PA.

## 2012-05-08 ENCOUNTER — Telehealth: Payer: Self-pay | Admitting: Internal Medicine

## 2012-05-09 NOTE — Telephone Encounter (Signed)
Called  [employer] for PA at [phone], spoke to Chanel.  His Nexium is now approved until 05/04/2013, # [id number], called [facility] and LM that it's approved [phone] and also notified patient who say thank you.

## 2012-05-10 ENCOUNTER — Other Ambulatory Visit: Payer: Medicaid Other

## 2012-05-17 ENCOUNTER — Other Ambulatory Visit: Payer: Self-pay | Admitting: *Deleted

## 2012-05-17 DIAGNOSIS — G4700 Insomnia, unspecified: Secondary | ICD-10-CM

## 2012-05-17 MED ORDER — AMITRIPTYLINE HCL 50 MG PO TABS: 50.0000 mg | ORAL_TABLET | Freq: Every day | ORAL | Status: DC

## 2012-05-19 ENCOUNTER — Ambulatory Visit: Payer: Medicaid Other | Admitting: Infectious Diseases

## 2012-05-30 ENCOUNTER — Other Ambulatory Visit: Payer: Self-pay | Admitting: Licensed Clinical Social Worker

## 2012-05-30 DIAGNOSIS — K659 Peritonitis, unspecified: Secondary | ICD-10-CM

## 2012-05-30 MED ORDER — SULFAMETHOXAZOLE-TMP DS 800-160 MG PO TABS: 1.0000 | ORAL_TABLET | Freq: Every day | ORAL | Status: DC

## 2012-06-01 ENCOUNTER — Telehealth: Payer: Self-pay | Admitting: Gastroenterology

## 2012-06-01 NOTE — Telephone Encounter (Signed)
Called [facility] [employer] and talked to [place] at [phone] for prior authorization of Nexium 40 mg 1 daily. Patient has tried and failed pantoprazole, Prilosec and Zegerid and has been approved on Nexium since 05/11/2011. Medication approved for 1 year, confirmation # [id number] P. Called [facility] in [place], [place]  And left a message on voicemail informing them that medication was approved.

## 2012-06-08 ENCOUNTER — Ambulatory Visit (HOSPITAL_COMMUNITY)
Admission: RE | Admit: 2012-06-08 | Discharge: 2012-06-08 | Disposition: A | Discharge status: Home / Self Care | Payer: Medicaid Other | Source: Ambulatory Visit | Attending: Internal Medicine | Admitting: Internal Medicine

## 2012-06-08 ENCOUNTER — Other Ambulatory Visit (HOSPITAL_BASED_OUTPATIENT_CLINIC_OR_DEPARTMENT_OTHER): Payer: Self-pay | Admitting: Internal Medicine

## 2012-06-08 ENCOUNTER — Encounter (HOSPITAL_BASED_OUTPATIENT_CLINIC_OR_DEPARTMENT_OTHER): Payer: Medicaid Other | Attending: Internal Medicine

## 2012-06-08 DIAGNOSIS — S91309A Unspecified open wound, unspecified foot, initial encounter: Secondary | ICD-10-CM | POA: Insufficient documentation

## 2012-06-08 DIAGNOSIS — L97509 Non-pressure chronic ulcer of other part of unspecified foot with unspecified severity: Secondary | ICD-10-CM | POA: Insufficient documentation

## 2012-06-08 DIAGNOSIS — X58XXXA Exposure to other specified factors, initial encounter: Secondary | ICD-10-CM | POA: Insufficient documentation

## 2012-06-08 DIAGNOSIS — G589 Mononeuropathy, unspecified: Secondary | ICD-10-CM | POA: Insufficient documentation

## 2012-06-08 DIAGNOSIS — M869 Osteomyelitis, unspecified: Secondary | ICD-10-CM | POA: Insufficient documentation

## 2012-06-09 NOTE — Progress Notes (Signed)
[facility]  NAME:  JEMELL, TOWN NO.:  [id number]  MEDICAL RECORD NO.:  [id number]      DATE OF BIRTH:  Jun 05, 1966  PHYSICIAN:  Maxwell Caul, M.D. VISIT DATE:  06/08/2012                                  OFFICE VISIT   LOCATION:  [facility].  HISTORY:  Mr. Kadlec is a 46 year old man kindly referred by [facility] for review of an ulceration on his left foot.  The patient tells me that this began in October when he was walking barefoot in his home in a shag carpet where he stepped on a nail.  The foot was punctured at that point.  He treated this himself for a period of time. Ultimately, came to attention of podiatrist in [place], was treated with Silvadene cream; however, felt that he was generally getting worse despite several months of treatment for this.  He tells me he did not have this cultured or x-rayed.  He came to the attention of his family physician at [facility].  There, it was noted that he had a plantar large left massive bulging callus and necrotic skin.  This underwent debridement in their office.  He was treated with antibacterial soap, clean daily and Hydrogel as well as pressure relief.  PAST MEDICAL HISTORY:  Includes: 1. Alcoholic cirrhosis. 2. Gastroesophageal reflux disease. 3. 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 4. HIV since 1991. 5. Anxiety disorder. 6. Major depression.  MEDICATIONS:  Medication list is reviewed.  He is on Aldactone 50 twice a day, Atripla 1 tablet daily, amitriptyline 25 at bedtime, Bactrim DS 1 daily which he takes permanently, Nexium 40 daily, Xanax 0.5 1 tablet twice daily.  PHYSICAL EXAMINATION:  Temperature is 98.5, pulse 83, respirations 18, blood pressure 139/79.  His peripheral pulses are palpable, although his ABI as calculated in this clinic was only 0.8.  The area in question is over his left second metatarsal  head.  However, it seemed to have callus surrounding a large area with a central area, which is probably the original puncture wound.  All of this, underwent an extensive debridement with a #10 blade.  A culture was done.  There was really a good blood flow, which required pressure and several silver nitrate sticks to obtain hemostasis.  I was concerned enough about the tenderness around this area, to prescribe doxycycline for him while we await cultures, a plain x-ray will be done to exclude active bone infection.  IMPRESSION:  Original nail puncture wound in the setting of chronic neuropathy.  The area was debrided as noted above and cultured.  He will be started on doxycycline.  A plain x-ray was done.  We dressed the wound with silver alginate.  He was given a Darco sandal to offload his forefoot.  We will see him again in a week.  I have informed him that if things do not seem to be going well, he will need an MRI of his foot.          ______________________________ Maxwell Caul, M.D.     MGR/MEDQ  D:  06/08/2012  T:  06/09/2012  Job:  [id number]

## 2012-06-15 ENCOUNTER — Encounter (HOSPITAL_BASED_OUTPATIENT_CLINIC_OR_DEPARTMENT_OTHER): Payer: Medicaid Other | Attending: Internal Medicine

## 2012-06-15 DIAGNOSIS — L84 Corns and callosities: Secondary | ICD-10-CM | POA: Insufficient documentation

## 2012-06-15 DIAGNOSIS — S91309A Unspecified open wound, unspecified foot, initial encounter: Secondary | ICD-10-CM | POA: Insufficient documentation

## 2012-06-15 DIAGNOSIS — G589 Mononeuropathy, unspecified: Secondary | ICD-10-CM | POA: Insufficient documentation

## 2012-06-15 DIAGNOSIS — A4902 Methicillin resistant Staphylococcus aureus infection, unspecified site: Secondary | ICD-10-CM | POA: Insufficient documentation

## 2012-06-15 DIAGNOSIS — X58XXXA Exposure to other specified factors, initial encounter: Secondary | ICD-10-CM | POA: Insufficient documentation

## 2012-06-19 ENCOUNTER — Telehealth: Payer: Self-pay | Admitting: Internal Medicine

## 2012-06-20 NOTE — Telephone Encounter (Signed)
Patient advised he does not need labs

## 2012-07-06 ENCOUNTER — Encounter (HOSPITAL_BASED_OUTPATIENT_CLINIC_OR_DEPARTMENT_OTHER): Payer: Medicaid Other

## 2012-07-13 ENCOUNTER — Other Ambulatory Visit (INDEPENDENT_AMBULATORY_CARE_PROVIDER_SITE_OTHER): Payer: Medicaid Other

## 2012-07-13 ENCOUNTER — Encounter: Payer: Self-pay | Admitting: Internal Medicine

## 2012-07-13 ENCOUNTER — Ambulatory Visit (INDEPENDENT_AMBULATORY_CARE_PROVIDER_SITE_OTHER): Payer: Medicaid Other | Admitting: Internal Medicine

## 2012-07-13 VITALS — BP 134/78 | HR 80 | Ht 70.75 in | Wt 253.1 lb

## 2012-07-13 DIAGNOSIS — R635 Abnormal weight gain: Secondary | ICD-10-CM

## 2012-07-13 DIAGNOSIS — D509 Iron deficiency anemia, unspecified: Secondary | ICD-10-CM

## 2012-07-13 DIAGNOSIS — K7030 Alcoholic cirrhosis of liver without ascites: Secondary | ICD-10-CM

## 2012-07-13 LAB — COMPREHENSIVE METABOLIC PANEL
ALT: 58 U/L — ABNORMAL HIGH (ref 0–53)
AST: 72 U/L — ABNORMAL HIGH (ref 0–37)
Alkaline Phosphatase: 150 U/L — ABNORMAL HIGH (ref 39–117)
BUN: 14 mg/dL (ref 6–23)
CO2: 25 mEq/L (ref 19–32)
Calcium: 9.6 mg/dL (ref 8.4–10.5)
Chloride: 107 mEq/L (ref 96–112)
Creatinine, Ser: 0.8 mg/dL (ref 0.4–1.5)
Total Bilirubin: 1.4 mg/dL — ABNORMAL HIGH (ref 0.3–1.2)

## 2012-07-13 LAB — CBC WITH DIFFERENTIAL/PLATELET
Basophils Relative: 0.3 % (ref 0.0–3.0)
Eosinophils Absolute: 0.2 10*3/uL (ref 0.0–0.7)
Eosinophils Relative: 3 % (ref 0.0–5.0)
HCT: 46.3 % (ref 39.0–52.0)
Hemoglobin: 15.4 g/dL (ref 13.0–17.0)
Lymphocytes Relative: 21.8 % (ref 12.0–46.0)
MCHC: 33.2 g/dL (ref 30.0–36.0)
Monocytes Absolute: 0.6 10*3/uL (ref 0.1–1.0)
Monocytes Relative: 12.3 % — ABNORMAL HIGH (ref 3.0–12.0)
Neutro Abs: 3.2 10*3/uL (ref 1.4–7.7)
Neutrophils Relative %: 62.6 % (ref 43.0–77.0)
RBC: 5.07 Mil/uL (ref 4.22–5.81)
WBC: 5.2 10*3/uL (ref 4.5–10.5)

## 2012-07-13 LAB — FERRITIN: Ferritin: 28.4 ng/mL (ref 22.0–322.0)

## 2012-07-13 LAB — PROTIME-INR: INR: 1.3 ratio — ABNORMAL HIGH (ref 0.8–1.0)

## 2012-07-13 LAB — TSH: TSH: 0.03 u[IU]/mL — ABNORMAL LOW (ref 0.35–5.50)

## 2012-07-13 NOTE — Progress Notes (Signed)
Subjective:    Patient ID: [name], male    DOB: June 18, 1965, 47 y.o.   MRN: [id number]  HPI Middle-aged man with HIV and alcoholic cirrhosis here for f/u. He is ok but continues to gain weight. Still exercising. Weight has picked up. Saw dietitian last year. Avoiding carbs.  Abstinent for 2.5 years Wt Readings from Last 3 Encounters:  07/13/12 253 lb 2 oz (114.817 kg)  04/21/12 249 lb (112.946 kg)  04/17/12 246 lb 12.8 oz (111.948 kg)   Allergies  Allergen Reactions  . Penicillins     REACTION: hives   Outpatient Prescriptions Prior to Visit  Medication Sig Dispense Refill  . acyclovir (ZOVIRAX) 400 MG tablet       . ALPRAZolam (XANAX) 1 MG tablet Take 1 tablet (1 mg total) by mouth 2 (two) times daily as needed.  60 tablet  3  . aMILoride (MIDAMOR) 5 MG tablet Take 2 tablets by mouth daily  60 tablet  3  . amitriptyline (ELAVIL) 50 MG tablet Take 1 tablet (50 mg total) by mouth at bedtime.  30 tablet  3  . ATRIPLA 600-200-300 MG per tablet TAKE ONE TABLET BY MOUTH EVERY DAY AT BEDTIME  30 tablet  3  . esomeprazole (NEXIUM) 40 MG capsule Take 1 capsule (40 mg total) by mouth daily before breakfast.  30 capsule  4  . ferrous sulfate 325 (65 FE) MG tablet Take 1 tablet (325 mg total) by mouth 2 (two) times daily with a meal.  60 tablet  3  . fluocinonide cream (LIDEX) 0.05 %       . furosemide (LASIX) 40 MG tablet Take 1 tablet (40 mg total) by mouth 2 (two) times daily.  30 tablet  3  . GENERLAC 10 GM/15ML SOLN TAKE 30 ML (20G TOTAL) BY MOUTH FOUR TIMES DAILY  4257 mL  2  . lactulose (CHRONULAC) 10 GM/15ML solution TAKE 30 MLS (20G TOTAL) BY MOUTH FOUR TIMES DAILY.  4266 mL  1  . potassium chloride (KLOR-CON) 20 MEQ packet Take 20 mEq by mouth daily.  30 packet  3  . Salicylic Acid-Cleanser 6 % (LOTION) KIT       . sulfamethoxazole-trimethoprim (BACTRIM DS) 800-160 MG per tablet Take 1 tablet by mouth daily.  30 tablet  3  . thiamine (VITAMIN B-1) 50 MG tablet Take 50 mg by  mouth daily.       Last reviewed on 07/13/2012 10:57 AM by Iva Boop, MD Past Medical History  Diagnosis Date  . HIV DISEASE 06/23/2006  . HYPERLIPIDEMIA 06/23/2006  . ANXIETY 06/23/2006  . DEPRESSION 06/23/2006  . 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 HYPERTENSION 06/23/2006  . 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 Ascites 11/13/2009  . STRAIN, CHEST WALL 03/15/2007  . 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 Gastric ulcer 06/2010  . Alcoholic cirrhosis of liver   . Alcoholism, chronic   . Iron deficiency anemia   . GERD (gastroesophageal reflux disease)   . SBP (spontaneous bacterial peritonitis) 05/03/2011    Suspected by high leukocytes on paracentesis. Clinical scenario also compatible. November 2012 responded to Levaquin. Started on trimethoprim-sulfamethoxazole double strength daily for prophylaxis.   . Obesity (BMI 30-39.9)     BMI 34 kg/m^2  . Left foot infection   . Skin cancer     left calf   Past Surgical History  Procedure Date  . Esophagogastroduodenoscopy w/ banding 06/26/2010  variceal ligation  . Carpal tunnel release     left  . Esophagogastroduodenoscopy 07/01/2010;  08/12/10    small varices, gastric ulcer  . Esophagogastroduodenoscopy 10/26/2011    Procedure: ESOPHAGOGASTRODUODENOSCOPY (EGD);  Surgeon: Iva Boop, MD;  Location: [facility];  Service: Endoscopy;  Laterality: N/A;  . Upper gastrointestinal endoscopy     Review of Systems Left foot wound - treated by wound care center Skin cancer left calf    Objective:   Physical Exam General:  NAD Eyes:   anicteric Lungs:  clear Heart:  S1S2 no rubs, murmurs or gallops Abdomen:  soft and nontender, BS+, obese Ext:   no edema pretibial    Assessment & Plan:   1. Alcoholic cirrhosis  US Abdomen Complete, Comprehensive metabolic panel, INR/PT, Ferritin, TSH  2. Weight gain  TSH  3. Iron  deficiency anemia, unspecified  CBC with Differential, Comprehensive metabolic panel, INR/PT, Ferritin, TSH   Overall doing well. Weight gain could be from amitriptyline taken for neuropathy. Evaluation as above - can cancel CBC and CMET planned for 07/2012 unless labs today indicate need  Anticipate 6 month f/u after reviewing labs and [place] He will be due for surveillance EGD 10/2012

## 2012-07-13 NOTE — Patient Instructions (Addendum)
You have been given a separate informational sheet regarding your tobacco use, the importance of quitting and local resources to help you quit.  Your physician has requested that you go to the basement for the following lab work before leaving today: CBC/diff, CMET, PT/INR, TSH, Ferritin  You have been scheduled for an abdominal ultrasound at [facility] (1st floor of hospital) on Monday Feb. 3  at 9:30am. Please arrive 15 minutes prior to your appointment for registration. Make certain not to have anything to eat or drink 6 hours prior to your appointment. Should you need to reschedule your appointment, please contact radiology at [phone]. This test typically takes about 30 minutes to perform..  Thank you for choosing me and [facility].  Iva Boop, M.D., [facility]

## 2012-07-14 ENCOUNTER — Other Ambulatory Visit (INDEPENDENT_AMBULATORY_CARE_PROVIDER_SITE_OTHER): Payer: Medicaid Other

## 2012-07-14 ENCOUNTER — Other Ambulatory Visit: Payer: Self-pay | Admitting: Internal Medicine

## 2012-07-14 DIAGNOSIS — R946 Abnormal results of thyroid function studies: Secondary | ICD-10-CM

## 2012-07-14 NOTE — Progress Notes (Signed)
Quick Note:  Needs free T4 and free Ts done on blood (add on)due to abnormal thyroid test - TSH (use that dx) ______

## 2012-07-14 NOTE — Progress Notes (Signed)
Test added on per Dr. Leone Payor, orders put in and add on sheet faxed down to lab.  Called and let Vicky in the lab know it was coming.

## 2012-07-17 ENCOUNTER — Telehealth: Payer: Self-pay | Admitting: Internal Medicine

## 2012-07-17 ENCOUNTER — Ambulatory Visit (HOSPITAL_COMMUNITY): Payer: Medicaid Other

## 2012-07-17 MED ORDER — FUROSEMIDE 40 MG PO TABS: 40.0000 mg | ORAL_TABLET | Freq: Two times a day (BID) | ORAL | Status: DC

## 2012-07-17 NOTE — Telephone Encounter (Signed)
6 refills °

## 2012-07-17 NOTE — Progress Notes (Signed)
Quick Note:  Labs ok One thyroid test is off but I checked another to suggest it is not a problem at this time Will need to recheck that and doublecheck with Dr. Maurice March when he sees him  I will contact Dr. Maurice March also  Please cancel the CBC and CMEt ordered for upcoming [facility] clinic visit - he shouldn't need those ______

## 2012-07-17 NOTE — Telephone Encounter (Signed)
Please advise how many refills you want him to have.  Thanks [name].

## 2012-07-18 ENCOUNTER — Telehealth: Payer: Self-pay | Admitting: Internal Medicine

## 2012-07-18 ENCOUNTER — Other Ambulatory Visit: Payer: Self-pay | Admitting: Internal Medicine

## 2012-07-18 DIAGNOSIS — K7030 Alcoholic cirrhosis of liver without ascites: Secondary | ICD-10-CM

## 2012-07-18 DIAGNOSIS — R635 Abnormal weight gain: Secondary | ICD-10-CM

## 2012-07-18 MED ORDER — FUROSEMIDE 40 MG PO TABS: 40.0000 mg | ORAL_TABLET | Freq: Two times a day (BID) | ORAL | Status: DC

## 2012-07-18 NOTE — Telephone Encounter (Signed)
Patient received a letter from [facility] that the [place] was denied.  Dr. Leone Payor please call [phone] press option #2 then option #4.  You will need to provide them the case number [id number] and his DOB.  They say the ultrasound is not supported per their guidelines.  Will you please do PEER to PEER.  The [place] is scheduled for Thursday.

## 2012-07-18 NOTE — Telephone Encounter (Signed)
I have spoken to patient multiple times, [name] at the now [employer] in [place] (was [name]) and Market researcher in [place] radiology and  [name] in [facility] Pre-Cert Dept.  We had e-scribed yest and today to [facility] drugs and because of the take over the routing #'s are different in the computer so his Lasix was verbally given to [name] the pharm.  Also have rescheduled pt's U/S appointment to get it pre-certed to 07/26/12 at [facility] 7:30am, patient aware and LM in pre-cert with new date and time.   At this time unable to put [facility] in [place] in epic.

## 2012-07-19 ENCOUNTER — Ambulatory Visit (HOSPITAL_COMMUNITY): Admission: RE | Admit: 2012-07-19 | Payer: Medicaid Other | Source: Ambulatory Visit

## 2012-07-20 ENCOUNTER — Other Ambulatory Visit: Payer: Self-pay | Admitting: Internal Medicine

## 2012-07-20 ENCOUNTER — Other Ambulatory Visit: Payer: Self-pay

## 2012-07-20 NOTE — Telephone Encounter (Signed)
Order changed to limited US RUQ with radiology scheduling

## 2012-07-20 NOTE — Telephone Encounter (Signed)
Have approval but change order to limited RUQ [place] please need to do that  CPT code would be 76705  Auth # is [id number]

## 2012-07-20 NOTE — Telephone Encounter (Signed)
[name] please advise on refilling this, thank you.

## 2012-07-20 NOTE — Telephone Encounter (Signed)
Refill x 6 months 

## 2012-07-21 ENCOUNTER — Telehealth: Payer: Self-pay

## 2012-07-21 DIAGNOSIS — B20 Human immunodeficiency virus [HIV] disease: Secondary | ICD-10-CM

## 2012-07-21 NOTE — Telephone Encounter (Signed)
I have cancelled the CBC/diff and CMET future orders put in by Dr. Maurice March for patients next visit.  Per Dr. Leone Payor not needed at that visit.

## 2012-07-21 NOTE — Telephone Encounter (Signed)
LM letting patient know that his U/S has been approved by his insurance company and to please keep that appointment for 07/26/12 at 7:30am [facility] radiology .  Also LM with his recent lab results and instructed him to call with any questions .

## 2012-07-26 ENCOUNTER — Ambulatory Visit (HOSPITAL_COMMUNITY): Admission: RE | Admit: 2012-07-26 | Payer: Medicaid Other | Source: Ambulatory Visit

## 2012-07-26 ENCOUNTER — Ambulatory Visit (HOSPITAL_COMMUNITY): Payer: Medicaid Other

## 2012-07-31 ENCOUNTER — Telehealth: Payer: Self-pay | Admitting: Internal Medicine

## 2012-07-31 ENCOUNTER — Other Ambulatory Visit: Payer: Self-pay

## 2012-07-31 DIAGNOSIS — R1011 Right upper quadrant pain: Secondary | ICD-10-CM

## 2012-07-31 NOTE — Telephone Encounter (Signed)
Patient missed appt for [place] due to snow.  He is rescheduled for 08/03/12 8:30, he states that he can't do that day.  He is provided the number to call back and reschedule

## 2012-08-01 ENCOUNTER — Other Ambulatory Visit: Payer: Self-pay | Admitting: *Deleted

## 2012-08-01 DIAGNOSIS — F419 Anxiety disorder, unspecified: Secondary | ICD-10-CM

## 2012-08-01 MED ORDER — ALPRAZOLAM 1 MG PO TABS: 1.0000 mg | ORAL_TABLET | Freq: Two times a day (BID) | ORAL | Status: DC | PRN

## 2012-08-02 ENCOUNTER — Other Ambulatory Visit (HOSPITAL_COMMUNITY): Payer: Medicaid Other

## 2012-08-10 ENCOUNTER — Ambulatory Visit (HOSPITAL_COMMUNITY)
Admission: RE | Admit: 2012-08-10 | Discharge: 2012-08-10 | Disposition: A | Discharge status: Home / Self Care | Payer: Medicaid Other | Source: Ambulatory Visit | Attending: Internal Medicine | Admitting: Internal Medicine

## 2012-08-10 ENCOUNTER — Ambulatory Visit (HOSPITAL_COMMUNITY): Admission: RE | Admit: 2012-08-10 | Payer: Medicaid Other | Source: Ambulatory Visit

## 2012-08-10 ENCOUNTER — Other Ambulatory Visit: Payer: Medicaid Other

## 2012-08-10 ENCOUNTER — Other Ambulatory Visit: Payer: Self-pay | Admitting: Internal Medicine

## 2012-08-10 DIAGNOSIS — B20 Human immunodeficiency virus [HIV] disease: Secondary | ICD-10-CM

## 2012-08-10 DIAGNOSIS — K8020 Calculus of gallbladder without cholecystitis without obstruction: Secondary | ICD-10-CM | POA: Insufficient documentation

## 2012-08-10 DIAGNOSIS — K7460 Unspecified cirrhosis of liver: Secondary | ICD-10-CM | POA: Insufficient documentation

## 2012-08-10 DIAGNOSIS — Z79899 Other long term (current) drug therapy: Secondary | ICD-10-CM

## 2012-08-10 DIAGNOSIS — R1011 Right upper quadrant pain: Secondary | ICD-10-CM

## 2012-08-10 LAB — LIPID PANEL
Cholesterol: 144 mg/dL (ref 0–200)
LDL Cholesterol: 71 mg/dL (ref 0–99)
Total CHOL/HDL Ratio: 2.3 Ratio
Triglycerides: 46 mg/dL (ref <150)
VLDL: 9 mg/dL (ref 0–40)

## 2012-08-10 LAB — CBC WITH DIFFERENTIAL/PLATELET
Basophils Absolute: 0 10*3/uL (ref 0.0–0.1)
Basophils Relative: 1 % (ref 0–1)
Eosinophils Absolute: 0.1 10*3/uL (ref 0.0–0.7)
Eosinophils Relative: 4 % (ref 0–5)
HCT: 43.3 % (ref 39.0–52.0)
MCH: 30.6 pg (ref 26.0–34.0)
MCHC: 34.9 g/dL (ref 30.0–36.0)
MCV: 87.8 fL (ref 78.0–100.0)
Neutrophils Relative %: 49 % (ref 43–77)
Platelets: 59 10*3/uL — ABNORMAL LOW (ref 150–400)
RBC: 4.93 MIL/uL (ref 4.22–5.81)
RDW: 18.5 % — ABNORMAL HIGH (ref 11.5–15.5)

## 2012-08-10 LAB — COMPREHENSIVE METABOLIC PANEL
ALT: 60 U/L — ABNORMAL HIGH (ref 0–53)
AST: 74 U/L — ABNORMAL HIGH (ref 0–37)
Albumin: 3.9 g/dL (ref 3.5–5.2)
Alkaline Phosphatase: 154 U/L — ABNORMAL HIGH (ref 39–117)
CO2: 20 mEq/L (ref 19–32)
Chloride: 105 mEq/L (ref 96–112)
Creat: 0.72 mg/dL (ref 0.50–1.35)
Sodium: 140 mEq/L (ref 135–145)
Total Bilirubin: 1.9 mg/dL — ABNORMAL HIGH (ref 0.3–1.2)
Total Protein: 6.4 g/dL (ref 6.0–8.3)

## 2012-08-11 NOTE — Progress Notes (Signed)
Quick Note:  1 gallstone and cirrhosis as before - nothing bad (no tumors!) Please let him know ______

## 2012-08-14 LAB — HIV-1 RNA QUANT-NO REFLEX-BLD: HIV-1 RNA Quant, Log: <1.3 {Log} (ref <1.30)

## 2012-08-23 ENCOUNTER — Telehealth: Payer: Self-pay | Admitting: *Deleted

## 2012-08-23 ENCOUNTER — Telehealth: Payer: Self-pay

## 2012-08-23 MED ORDER — POTASSIUM CHLORIDE 20 MEQ PO PACK: 20.0000 meq | PACK | Freq: Every day | ORAL | Status: DC

## 2012-08-23 NOTE — Telephone Encounter (Signed)
Message copied by [place], PATTI E on Wed Aug 23, 2012  7:30 AM ------      Message from: Iva Boop      Created: Tue Aug 22, 2012  8:18 PM       Ok to refill x 6 months      ----- Message -----         From: Patti E [place], CMA         Sent: 08/21/2012  12:41 PM           To: Iva Boop, MD            Pharmacy is requesting a refill on his Klor-Con M20 , please advise if ok [name].       ------

## 2012-08-23 NOTE — Telephone Encounter (Signed)
Called this refill on his Klor-Con M20 to [facility] in [place] at # [phone] and left the ok on the voice mail.

## 2012-08-23 NOTE — Telephone Encounter (Signed)
Patient mother called with concerns about the patient she wants to tell Dr Maurice March. She advised the patient has been getting lost and appears disoriented and he has visible shakness. She is concerned because the patient can not leave the house alone and he refuses her help. She just wanted to make Dr Maurice March aware prior to his visit 08/25/12. I thanked her for calling and advised will let the doctor know so he may discuss with the patient at his visit. She said she will be her with the patient but he will not let her in the room for the visit.

## 2012-08-25 ENCOUNTER — Encounter: Payer: Self-pay | Admitting: Infectious Diseases

## 2012-08-25 ENCOUNTER — Ambulatory Visit (INDEPENDENT_AMBULATORY_CARE_PROVIDER_SITE_OTHER): Payer: Medicaid Other | Admitting: Infectious Diseases

## 2012-08-25 VITALS — BP 132/92 | HR 84 | Temp 97.5°F | Wt 257.0 lb

## 2012-08-25 DIAGNOSIS — B20 Human immunodeficiency virus [HIV] disease: Secondary | ICD-10-CM

## 2012-08-25 NOTE — Patient Instructions (Addendum)
Keep up the good work

## 2012-08-28 ENCOUNTER — Other Ambulatory Visit: Payer: Self-pay | Admitting: *Deleted

## 2012-08-28 DIAGNOSIS — G4700 Insomnia, unspecified: Secondary | ICD-10-CM

## 2012-08-28 MED ORDER — AMITRIPTYLINE HCL 50 MG PO TABS: 50.0000 mg | ORAL_TABLET | Freq: Every day | ORAL | Status: DC

## 2012-09-13 ENCOUNTER — Other Ambulatory Visit: Payer: Self-pay | Admitting: Infectious Diseases

## 2012-09-18 ENCOUNTER — Telehealth: Payer: Self-pay | Admitting: *Deleted

## 2012-09-18 NOTE — Telephone Encounter (Signed)
Patient states that at his last visit, he and Dr. Maurice March discussed upping the amitriptyline from 50 mg QHS to 100 mg QHS.  Pt has been taking 1.5 pills (75mg ) at night for his neuropathy since his visit and will run out in 3 days.  He would like [place] to call in the new prescription for 100 mg amitriptyline. Andree Coss, RN

## 2012-09-19 ENCOUNTER — Telehealth: Payer: Self-pay | Admitting: Internal Medicine

## 2012-09-19 NOTE — Telephone Encounter (Signed)
The Patient requested his GI records be sent to Dr. Belva Crome 11/03/2011  asw  5 pages were faxed to [phone] on 11/03/2011  asw

## 2012-09-27 ENCOUNTER — Other Ambulatory Visit: Payer: Self-pay | Admitting: *Deleted

## 2012-09-27 DIAGNOSIS — K659 Peritonitis, unspecified: Secondary | ICD-10-CM

## 2012-09-27 MED ORDER — SULFAMETHOXAZOLE-TMP DS 800-160 MG PO TABS: 1.0000 | ORAL_TABLET | Freq: Every day | ORAL | Status: DC

## 2012-10-09 ENCOUNTER — Other Ambulatory Visit: Payer: Self-pay | Admitting: *Deleted

## 2012-10-09 DIAGNOSIS — G4700 Insomnia, unspecified: Secondary | ICD-10-CM

## 2012-10-09 MED ORDER — AMITRIPTYLINE HCL 100 MG PO TABS: 100.0000 mg | ORAL_TABLET | Freq: Every day | ORAL | Status: DC

## 2012-10-09 NOTE — Progress Notes (Signed)
Increased to 100mg  QHS per Dr. Maurice March. Andree Coss, RN

## 2012-10-20 ENCOUNTER — Other Ambulatory Visit: Payer: Self-pay | Admitting: Infectious Diseases

## 2012-10-25 ENCOUNTER — Other Ambulatory Visit: Payer: Self-pay | Admitting: Internal Medicine

## 2012-10-26 ENCOUNTER — Other Ambulatory Visit: Payer: Self-pay

## 2012-10-26 ENCOUNTER — Telehealth: Payer: Self-pay | Admitting: Internal Medicine

## 2012-10-26 MED ORDER — LACTULOSE ENCEPHALOPATHY 10 GM/15ML PO SOLN: ORAL | Status: DC

## 2012-10-26 NOTE — Telephone Encounter (Signed)
The labs he is getting at [facility] in [place] are what we need Nothing else at this time

## 2012-10-26 NOTE — Telephone Encounter (Signed)
Please advise labs needed prior to visit and I will put orders in [name].  Thank you.

## 2012-10-31 ENCOUNTER — Telehealth: Payer: Self-pay | Admitting: Internal Medicine

## 2012-11-01 NOTE — Telephone Encounter (Signed)
Left message for patient to call back  

## 2012-11-02 ENCOUNTER — Other Ambulatory Visit: Payer: Medicaid Other

## 2012-11-02 DIAGNOSIS — B20 Human immunodeficiency virus [HIV] disease: Secondary | ICD-10-CM

## 2012-11-02 LAB — COMPLETE METABOLIC PANEL WITH GFR
ALT: 36 U/L (ref 0–53)
Albumin: 4 g/dL (ref 3.5–5.2)
CO2: 24 mEq/L (ref 19–32)
Calcium: 9.2 mg/dL (ref 8.4–10.5)
Chloride: 104 mEq/L (ref 96–112)
Creat: 0.84 mg/dL (ref 0.50–1.35)
GFR, Est African American: >89 mL/min
GFR, Est Non African American: >89 mL/min
Glucose, Bld: 98 mg/dL (ref 70–99)
Sodium: 137 mEq/L (ref 135–145)
Total Protein: 6.3 g/dL (ref 6.0–8.3)

## 2012-11-02 LAB — CBC WITH DIFFERENTIAL/PLATELET
Basophils Relative: 1 % (ref 0–1)
Eosinophils Absolute: 0.1 10*3/uL (ref 0.0–0.7)
MCH: 30.5 pg (ref 26.0–34.0)
MCHC: 35.4 g/dL (ref 30.0–36.0)
Neutrophils Relative %: 55 % (ref 43–77)
Platelets: 62 10*3/uL — ABNORMAL LOW (ref 150–400)
RBC: 4.76 MIL/uL (ref 4.22–5.81)

## 2012-11-02 MED ORDER — ESOMEPRAZOLE MAGNESIUM 40 MG PO CPDR: 40.0000 mg | DELAYED_RELEASE_CAPSULE | Freq: Two times a day (BID) | ORAL | Status: DC

## 2012-11-02 NOTE — Telephone Encounter (Signed)
We could give some samples til 6/16 visit

## 2012-11-02 NOTE — Telephone Encounter (Signed)
Patient aware.  He will come pick up samples, but double up with what he has at home until he comes to [place].  He is reminded we will be closed on Monday for [facility]

## 2012-11-02 NOTE — Telephone Encounter (Signed)
Patient c/o GERD and abdominal burning wants to increase Nexium to BID.  Is this ok?

## 2012-11-03 LAB — HIV-1 RNA QUANT-NO REFLEX-BLD
HIV 1 RNA Quant: <20 copies/mL (ref <20)
HIV-1 RNA Quant, Log: <1.3 {Log} (ref <1.30)

## 2012-11-03 LAB — T-HELPER CELL (CD4) - (RCID CLINIC ONLY): CD4 T Cell Abs: 240 uL — ABNORMAL LOW (ref 400–2700)

## 2012-11-08 ENCOUNTER — Other Ambulatory Visit: Payer: Self-pay | Admitting: Internal Medicine

## 2012-11-09 ENCOUNTER — Other Ambulatory Visit: Payer: Self-pay | Admitting: Infectious Diseases

## 2012-11-10 ENCOUNTER — Other Ambulatory Visit: Payer: Self-pay | Admitting: *Deleted

## 2012-11-10 ENCOUNTER — Telehealth: Payer: Self-pay | Admitting: Internal Medicine

## 2012-11-10 DIAGNOSIS — F419 Anxiety disorder, unspecified: Secondary | ICD-10-CM

## 2012-11-10 MED ORDER — ESOMEPRAZOLE MAGNESIUM 40 MG PO CPDR: 40.0000 mg | DELAYED_RELEASE_CAPSULE | Freq: Every day | ORAL | Status: DC

## 2012-11-10 MED ORDER — LACTULOSE 10 GM/15ML PO SOLN: 20.0000 g | Freq: Four times a day (QID) | ORAL | Status: DC

## 2012-11-10 MED ORDER — ALPRAZOLAM 1 MG PO TABS: 1.0000 mg | ORAL_TABLET | Freq: Two times a day (BID) | ORAL | Status: DC | PRN

## 2012-11-10 NOTE — Telephone Encounter (Signed)
Sent in refills as requested and noted for patient to keep 11/27/12 appointment.  Sent in Nexium for sig: one a day.  # 40 in case he is still doubling up on his dose as approved 10/31/12.

## 2012-11-20 ENCOUNTER — Other Ambulatory Visit: Payer: Self-pay

## 2012-11-20 MED ORDER — LACTULOSE 10 GM/15ML PO SOLN: 20.0000 g | Freq: Four times a day (QID) | ORAL | Status: DC

## 2012-11-21 ENCOUNTER — Telehealth: Payer: Self-pay | Admitting: Internal Medicine

## 2012-11-22 NOTE — Telephone Encounter (Signed)
Patient reports that he has "fluid from my waist to my chest".  He is not sure about how much weight he has gained.  He was 257 at the last recorded weight in his chart and he reports his weight is " about the same as that.  He is not short of breath, is swimming and lifting weights daily. He denies SOB or pedal edema.    He has an appt with you on Monday am.  He is still taking his lasix.  Please advise if needs to change his diuretics prior to Monday

## 2012-11-22 NOTE — Telephone Encounter (Signed)
I have left a message for the patient to keep the appt for 11/27/12 and Dr. Marvell Fuller response

## 2012-11-22 NOTE — Telephone Encounter (Signed)
Left message for patient to call back  

## 2012-11-22 NOTE — Telephone Encounter (Signed)
We have checked for ascites multiple times and he has not had Keep things same until REV

## 2012-11-24 ENCOUNTER — Encounter: Payer: Self-pay | Admitting: Infectious Diseases

## 2012-11-24 ENCOUNTER — Ambulatory Visit (INDEPENDENT_AMBULATORY_CARE_PROVIDER_SITE_OTHER): Payer: Medicaid Other | Admitting: Infectious Diseases

## 2012-11-24 VITALS — BP 136/82 | HR 75 | Temp 97.9°F | Wt 256.0 lb

## 2012-11-24 DIAGNOSIS — F411 Generalized anxiety disorder: Secondary | ICD-10-CM

## 2012-11-24 DIAGNOSIS — F419 Anxiety disorder, unspecified: Secondary | ICD-10-CM

## 2012-11-24 DIAGNOSIS — G4700 Insomnia, unspecified: Secondary | ICD-10-CM

## 2012-11-24 MED ORDER — ALPRAZOLAM 1 MG PO TABS: 1.0000 mg | ORAL_TABLET | Freq: Two times a day (BID) | ORAL | Status: DC | PRN

## 2012-11-24 MED ORDER — AMITRIPTYLINE HCL 50 MG PO TABS
50.0000 mg | ORAL_TABLET | Freq: Every day | ORAL | Status: DC
Start: 2012-11-24 — End: 2012-11-27

## 2012-11-27 ENCOUNTER — Ambulatory Visit (INDEPENDENT_AMBULATORY_CARE_PROVIDER_SITE_OTHER): Payer: Medicaid Other | Admitting: Internal Medicine

## 2012-11-27 ENCOUNTER — Encounter: Payer: Self-pay | Admitting: Internal Medicine

## 2012-11-27 ENCOUNTER — Other Ambulatory Visit (INDEPENDENT_AMBULATORY_CARE_PROVIDER_SITE_OTHER): Payer: Medicaid Other

## 2012-11-27 ENCOUNTER — Telehealth: Payer: Self-pay | Admitting: Internal Medicine

## 2012-11-27 VITALS — BP 128/78 | HR 88 | Ht 70.25 in | Wt 258.0 lb

## 2012-11-27 DIAGNOSIS — K7030 Alcoholic cirrhosis of liver without ascites: Secondary | ICD-10-CM

## 2012-11-27 DIAGNOSIS — R946 Abnormal results of thyroid function studies: Secondary | ICD-10-CM

## 2012-11-27 DIAGNOSIS — I8500 Esophageal varices without bleeding: Secondary | ICD-10-CM

## 2012-11-27 DIAGNOSIS — K7290 Hepatic failure, unspecified without coma: Secondary | ICD-10-CM

## 2012-11-27 DIAGNOSIS — R635 Abnormal weight gain: Secondary | ICD-10-CM

## 2012-11-27 DIAGNOSIS — D649 Anemia, unspecified: Secondary | ICD-10-CM

## 2012-11-27 LAB — CBC WITH DIFFERENTIAL/PLATELET
Basophils Absolute: 0 10*3/uL (ref 0.0–0.1)
Eosinophils Absolute: 0.1 10*3/uL (ref 0.0–0.7)
Eosinophils Relative: 3.2 % (ref 0.0–5.0)
Lymphocytes Relative: 30.6 % (ref 12.0–46.0)
MCHC: 32.8 g/dL (ref 30.0–36.0)
Monocytes Relative: 8.6 % (ref 3.0–12.0)
Neutro Abs: 2.5 10*3/uL (ref 1.4–7.7)
Neutrophils Relative %: 57.4 % (ref 43.0–77.0)
Platelets: 77 10*3/uL — ABNORMAL LOW (ref 150.0–400.0)
RDW: 18.4 % — ABNORMAL HIGH (ref 11.5–14.6)
WBC: 4.3 10*3/uL — ABNORMAL LOW (ref 4.5–10.5)

## 2012-11-27 MED ORDER — RIFAXIMIN 550 MG PO TABS: 550.0000 mg | ORAL_TABLET | Freq: Two times a day (BID) | ORAL | Status: DC

## 2012-11-27 NOTE — Telephone Encounter (Signed)
I have left a message for the number that she will need to discuss with her son what happened at the office visit.  If he comes and signs a DPR that we can discuss with her then at that time I can give her more details

## 2012-11-27 NOTE — Progress Notes (Signed)
Subjective:    Patient ID: [name], male    DOB: Sep 09, 1965, 47 y.o.   MRN: [id number]  HPI [name] presents for followup. He called last week concerned that he had increasing abdominal girth and ascites. His weights are relatively stable since March, see below. He remained active and swims 20 laps a day. He is trying to eat right. He has been through this before and we have checked and not found ascites though he has gained approximately 10 kg since last summer.  He is having some tremor, and occasionally stutters his words. He has not been overtly confused though. Medications, allergies, past medical history, past surgical history, family history and social history are reviewed and updated in the EMR.  Review of Systems He is having some urinary difficulty, he has a partner, practice safe sex but he has had receptive anal intercourse and is not used to that, and since then has had difficulty urinating. His primary care provider put him on Flomax which is helping some he is not back to normal yet.    Objective:   Physical Exam General:  NAD Eyes:   anicteric Lungs:  Clear Chest:  No gynecomastia Heart:  S1S2 no rubs, murmurs or gallops Abdomen:  soft and nontender, BS+, no hepatosplenomegaly detected no fluid wave Ext:   no edema Neurologic: He is alert oriented x3, he has tremor versus asterixis or perhaps both   Data Reviewed:   Wt Readings from Last 3 Encounters:  11/27/12 258 lb (117.028 kg)  11/24/12 256 lb (116.121 kg)  08/25/12 257 lb (116.574 kg)   Lab Results  Component Value Date   WBC 3.8* 11/02/2012   HGB 14.5 11/02/2012   HCT 41.0 11/02/2012   MCV 86.1 11/02/2012   PLT 62* 11/02/2012     Chemistry      Component Value Date/Time   NA 137 11/02/2012 1118   K 3.9 11/02/2012 1118   CL 104 11/02/2012 1118   CO2 24 11/02/2012 1118   BUN 11 11/02/2012 1118   CREATININE 0.84 11/02/2012 1118   CREATININE 0.8 07/13/2012 1231      Component Value Date/Time   CALCIUM  9.2 11/02/2012 1118   ALKPHOS 134* 11/02/2012 1118   AST 44* 11/02/2012 1118   ALT 36 11/02/2012 1118   BILITOT 1.5* 11/02/2012 1118        Assessment & Plan:   1. Alcoholic cirrhosis of liver   2. Weight gain   3. Encephalopathy, hepatic    1. He does not have edema so I think it's unlikely that he has increasing ascites. However it is appropriate time to do a liver and abdominal survey again with an ultrasound given his underlying cirrhosis and screen for hepatoma and look for ascites. 2. check an alpha-fetoprotein as part of liver cancer screening 3. Check ammonia level for her suspected possible worsening encephalopathy 4. Start Xifaxan 550 mg twice a day for hepatic encephalopathy 5. Once I review the results limbi is followup, we'll check by phone regarding this tremor versus asterixis and response to Xifaxan 6. He was asking about his genitourinary problems and I advised she return to primary care and if persistent consider urology evaluation if his primary care provider agreed. He also told me he planned to avoid receptive anal intercourse. 7. Re:.weight gain since last year, had wondered if it would not be medications, he said Dr. Maurice March says it's not the HIV meds, he is on long-term amitriptyline which can do it. His  dose is being increased to 100 mg also. 8. Needs annual EGD to follow-up varices - will schedule w/ MAC    Medication List       These changes are accurate as of: 11/27/2012 12:15 PM. If you have any questions, ask your nurse or doctor.          TAKE these medications       acyclovir 400 MG tablet  Commonly known as:  ZOVIRAX     ALPRAZolam 1 MG tablet  Commonly known as:  XANAX  Take 1 tablet (1 mg total) by mouth 2 (two) times daily as needed.     aMILoride 5 MG tablet  Commonly known as:  MIDAMOR  TAKE 2 TABLETS BY MOUTH DAILY     amitriptyline 100 MG tablet  Commonly known as:  ELAVIL  Take 1 tablet (100 mg total) by mouth at bedtime.     ATRIPLA  600-200-300 MG per tablet  Generic drug:  efavirenz-emtricitabine-tenofovir  TAKE ONE TABLET BY MOUTH AT BEDTIME     esomeprazole 40 MG capsule  Commonly known as:  NEXIUM  Take 40 mg by mouth 2 (two) times daily before a meal.  Changed by:  Iva Boop, MD     ferrous sulfate 325 (65 FE) MG tablet  Take 1 tablet (325 mg total) by mouth 2 (two) times daily with a meal.     fluocinonide cream 0.05 %  Commonly known as:  LIDEX     furosemide 40 MG tablet  Commonly known as:  LASIX  Take 1 tablet (40 mg total) by mouth 2 (two) times daily.     GENERLAC 10 GM/15ML Soln  Generic drug:  lactulose (encephalopathy)  TAKE 30 ML (20G TOTAL) BY MOUTH FOUR TIMES DAILY     lactulose (encephalopathy) 10 GM/15ML Soln  Commonly known as:  CHRONULAC  TAKE 30 MLS BY MOUTH (20G TOTAL) FOUR TIMES DAILY.     lactulose 10 GM/15ML solution  Commonly known as:  CHRONULAC  Take 30 mLs (20 g total) by mouth 4 (four) times daily.     potassium chloride 20 MEQ packet  Commonly known as:  KLOR-CON  Take 20 mEq by mouth daily.     rifaximin 550 MG Tabs  Commonly known as:  XIFAXAN  Take 1 tablet (550 mg total) by mouth 2 (two) times daily.  Started by:  Iva Boop, MD     Salicylic Acid-Cleanser 6 % (LOTION) Kit     sulfamethoxazole-trimethoprim 800-160 MG per tablet  Commonly known as:  BACTRIM DS  Take 1 tablet by mouth daily.     thiamine 50 MG tablet  Commonly known as:  VITAMIN B-1  Take 50 mg by mouth daily.        CC: Alinda Deem, MD

## 2012-11-27 NOTE — Patient Instructions (Addendum)
We have sent the following medications to your pharmacy for you to pick up at your convenience: xifaxin  Your physician has requested that you go to the basement for the following lab work before leaving today: AFP   You have been scheduled for an abdominal ultrasound at [facility] (1st floor of hospital) on 12/01/12 at 9:00am. Please arrive 15 minutes prior to your appointment for registration. Make certain not to have anything to eat or drink 6 hours prior to your appointment. Should you need to reschedule your appointment, please contact radiology at [phone]. This test typically takes about 30 minutes to perform.  You have been scheduled for an endoscopy with propofol at [facility]. Please follow written instructions given to you at your visit today. If you use inhalers (even only as needed), please bring them with you on the day of your procedure.   I appreciate the opportunity to care for you.

## 2012-11-27 NOTE — Assessment & Plan Note (Signed)
Time for repeat EGD. Will arrange for July at [facility] w/ MAC

## 2012-11-28 LAB — AFP TUMOR MARKER: AFP-Tumor Marker: <1.3 ng/mL (ref 0.0–8.0)

## 2012-11-28 NOTE — Progress Notes (Signed)
Quick Note:  Labs ok but was supposed to get NH3 (ammonia) not a T3 level Need to not bill for T3 See if they can add an NH3 level or have him get it drawn ______

## 2012-11-29 ENCOUNTER — Telehealth: Payer: Self-pay | Admitting: Internal Medicine

## 2012-11-29 ENCOUNTER — Other Ambulatory Visit: Payer: Self-pay

## 2012-11-29 DIAGNOSIS — K7460 Unspecified cirrhosis of liver: Secondary | ICD-10-CM

## 2012-11-29 MED ORDER — ESOMEPRAZOLE MAGNESIUM 40 MG PO CPDR: 40.0000 mg | DELAYED_RELEASE_CAPSULE | Freq: Two times a day (BID) | ORAL | Status: DC

## 2012-11-29 MED ORDER — RIFAXIMIN 550 MG PO TABS: 550.0000 mg | ORAL_TABLET | Freq: Two times a day (BID) | ORAL | Status: DC

## 2012-11-29 NOTE — Telephone Encounter (Signed)
Rx's didn't go thru when sent recently.  [facility] now used to be [name] and we have had trouble e-scribing to this [place] location.  Left message for patient that we would call the xifaxan and Nexium in.   Since calling in the xifaxan in got a prior authorization needed on that.  Will do asap.

## 2012-11-30 ENCOUNTER — Telehealth: Payer: Self-pay

## 2012-11-30 MED ORDER — RIFAXIMIN 550 MG PO TABS: 550.0000 mg | ORAL_TABLET | Freq: Two times a day (BID) | ORAL | Status: DC

## 2012-11-30 NOTE — Telephone Encounter (Signed)
Called [place] [facility] at [phone] and spoke to Christol for a prior authorization on patients xifaxan, it was approved.  Confirmation # [id number] for #60 and 5 refills.  Dx# 572.2 Hepatic Encephalopathy .   Called [facility] in [place] at # [phone] and told Pharmacist Al that this was ok'ed.

## 2012-12-01 ENCOUNTER — Telehealth: Payer: Self-pay | Admitting: Internal Medicine

## 2012-12-01 ENCOUNTER — Ambulatory Visit (HOSPITAL_COMMUNITY)
Admission: RE | Admit: 2012-12-01 | Discharge: 2012-12-01 | Disposition: A | Discharge status: Home / Self Care | Payer: Medicaid Other | Source: Ambulatory Visit | Attending: Internal Medicine | Admitting: Internal Medicine

## 2012-12-01 ENCOUNTER — Other Ambulatory Visit (INDEPENDENT_AMBULATORY_CARE_PROVIDER_SITE_OTHER): Payer: Medicaid Other

## 2012-12-01 DIAGNOSIS — K7460 Unspecified cirrhosis of liver: Secondary | ICD-10-CM | POA: Insufficient documentation

## 2012-12-01 DIAGNOSIS — K766 Portal hypertension: Secondary | ICD-10-CM | POA: Insufficient documentation

## 2012-12-01 DIAGNOSIS — K7030 Alcoholic cirrhosis of liver without ascites: Secondary | ICD-10-CM

## 2012-12-01 DIAGNOSIS — R161 Splenomegaly, not elsewhere classified: Secondary | ICD-10-CM | POA: Insufficient documentation

## 2012-12-01 LAB — AMMONIA: Ammonia: 60 umol/L — ABNORMAL HIGH (ref 11–35)

## 2012-12-01 NOTE — Telephone Encounter (Signed)
Patient has his prescription, no additional questions needed

## 2012-12-02 IMAGING — CT CT ABD-PELV W/ CM
2 of 5 series · 16 of 46 positions shown, 18 images · IV contrast (APPLIED)
Comparison: None

CT CHEST

CLINICAL DATA: Fever of unknown origin, cirrhosis, ascites

CT CHEST, ABDOMEN AND PELVIS WITH CONTRAST
TECHNIQUE: Multidetector CT imaging of the chest, abdomen and
pelvis was performed following the standard protocol during bolus
administration of intravenous contrast.
Contrast: 100mL OMNIPAQUE IOHEXOL 300 MG/ML IV SOLN

[Series 2: cap with st · axial · 0.92mm/px · z∈[-718,-78]mm · 13 of 145 slices shown, 15 images]
[im 9/145  soft-tissue]
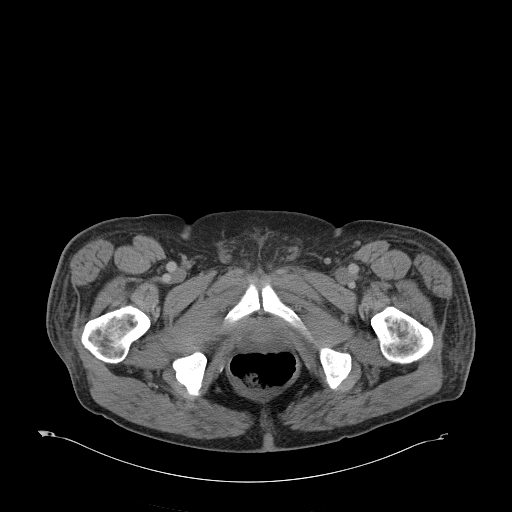
[im 9/145  bone]
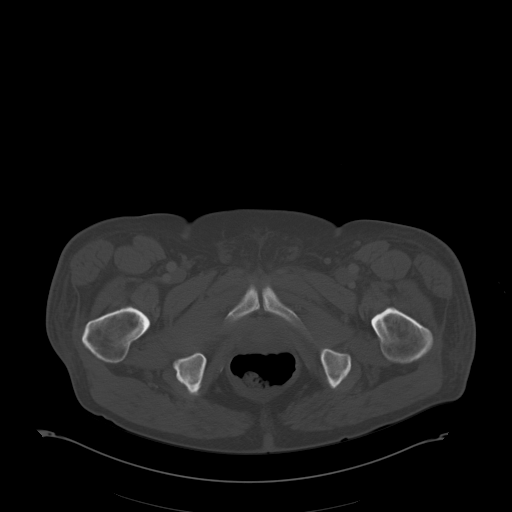
[im 17/145  soft-tissue]
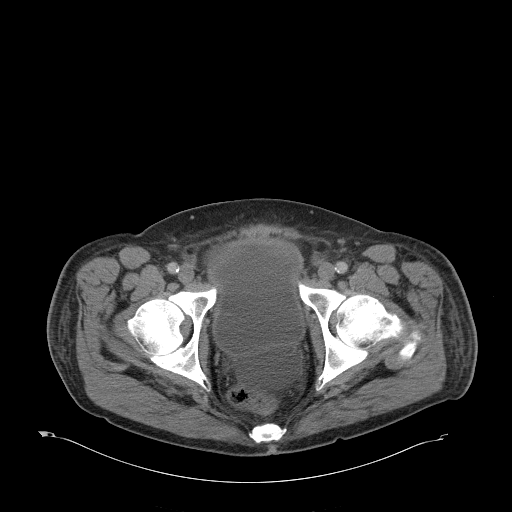
[im 33/145  soft-tissue]
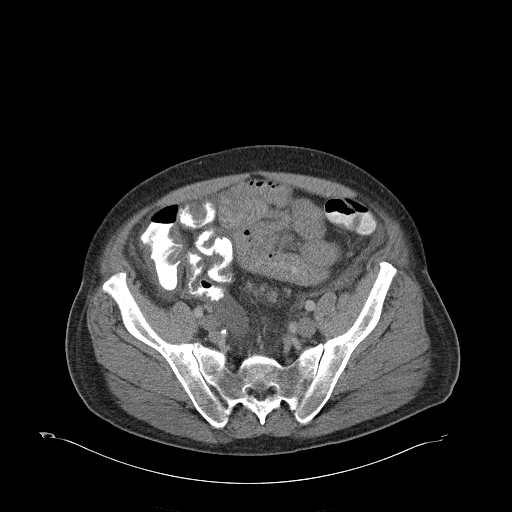
[im 41/145  soft-tissue]
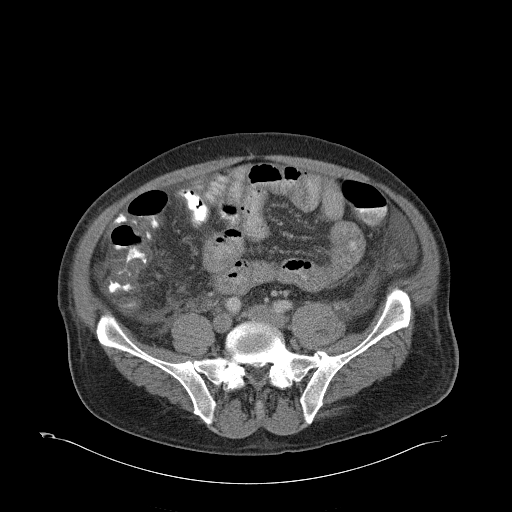
[im 49/145  soft-tissue]
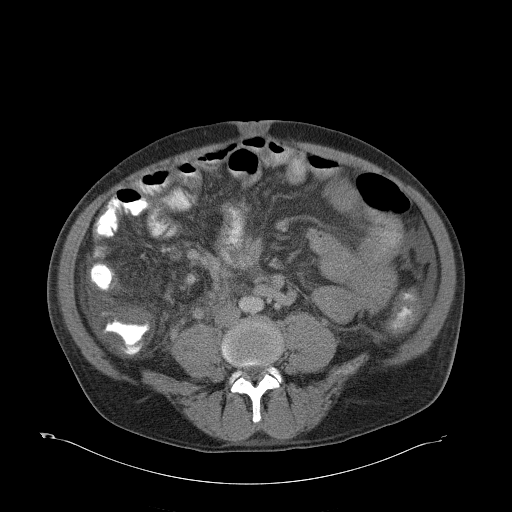
[im 65/145  soft-tissue]
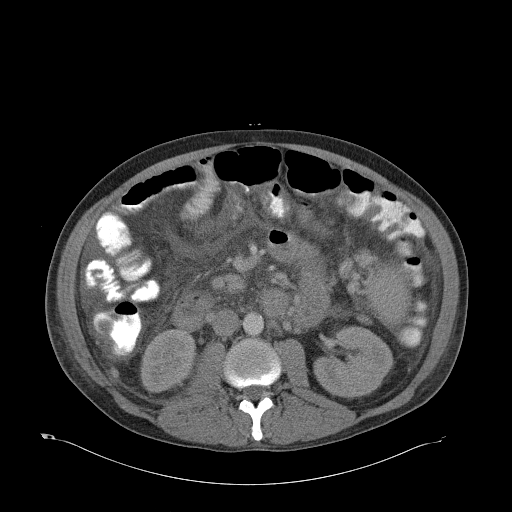
[im 73/145  soft-tissue]
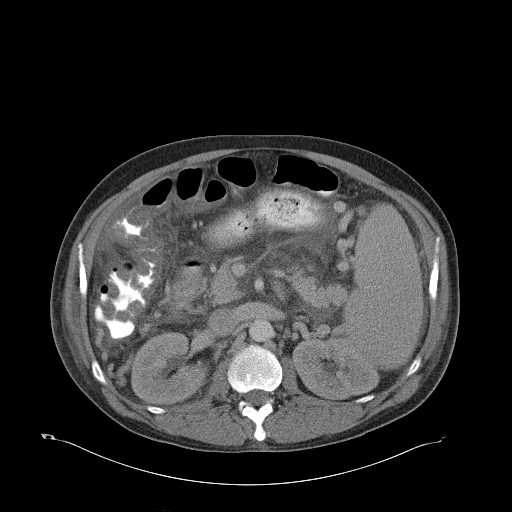
[im 81/145  soft-tissue]
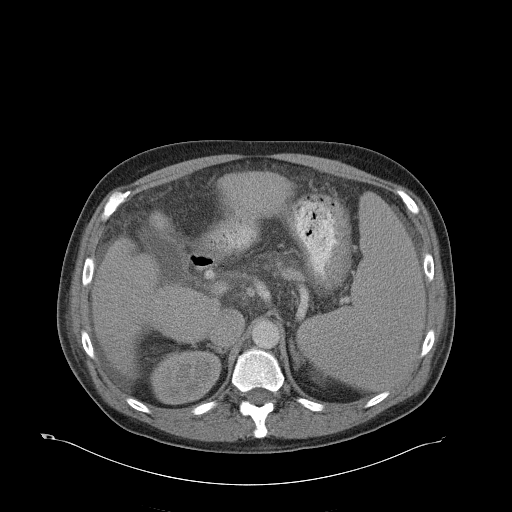
[im 97/145  soft-tissue]
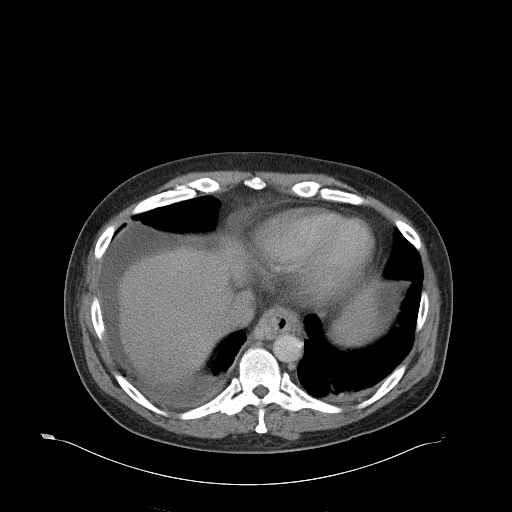
[im 97/145  bone]
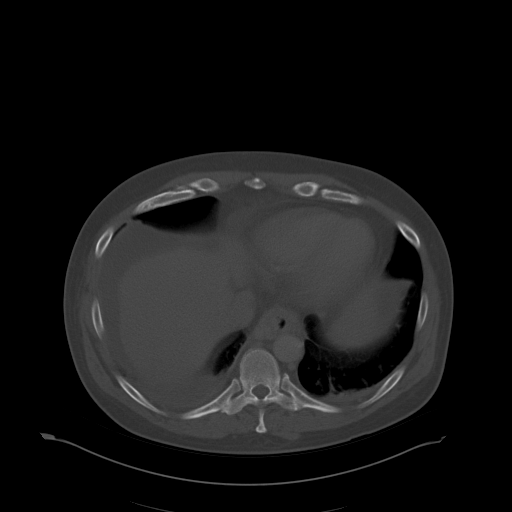
[im 105/145  soft-tissue]
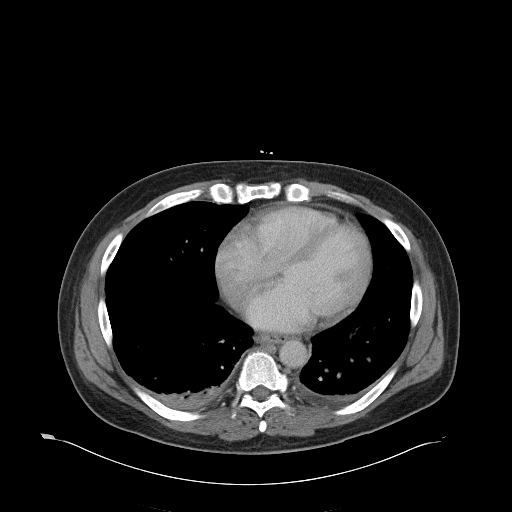
[im 113/145  soft-tissue]
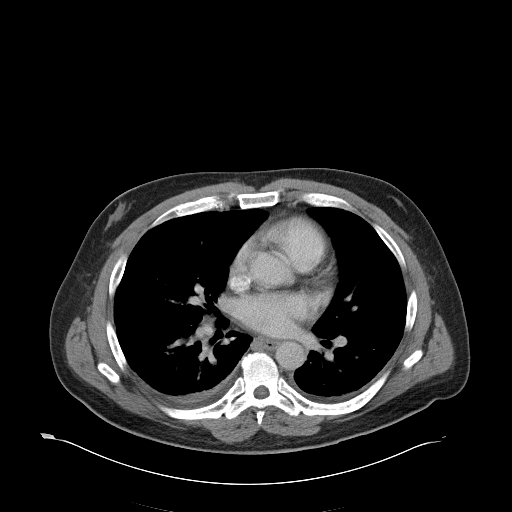
[im 129/145  soft-tissue]
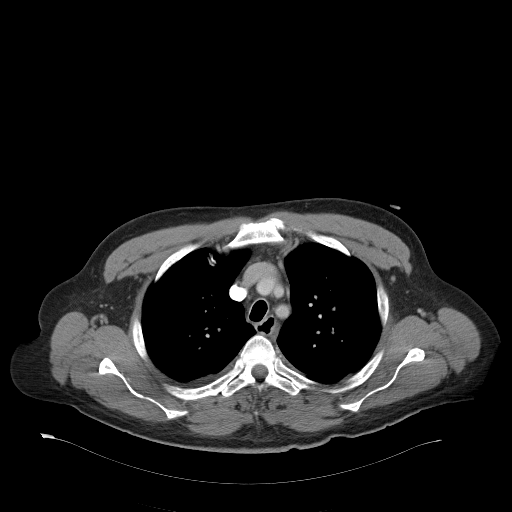
[im 137/145  soft-tissue]
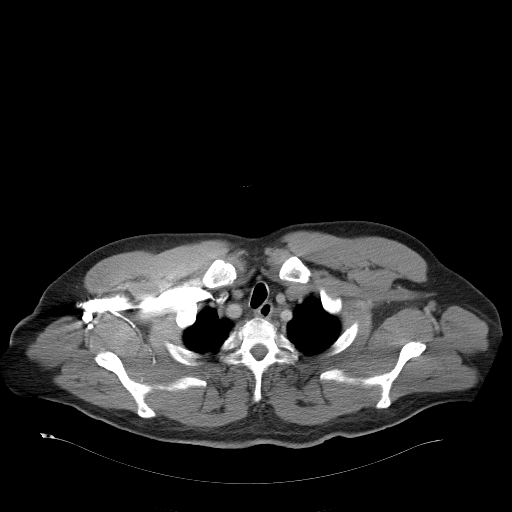

[Series 602: cor · coronal · 1.41mm/px · 3 of 139 slices shown]
[im 47/139  soft-tissue]
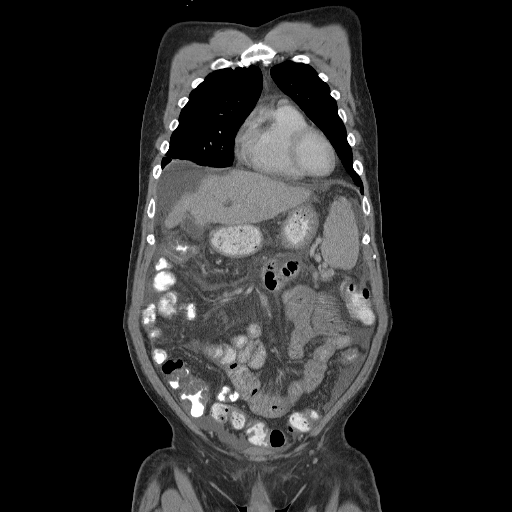
[im 62/139  soft-tissue]
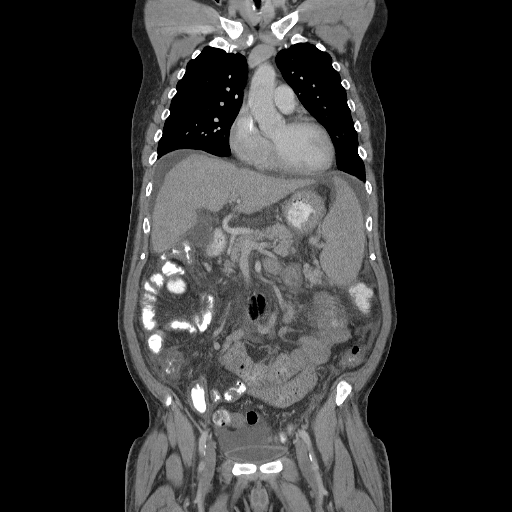
[im 77/139  soft-tissue]
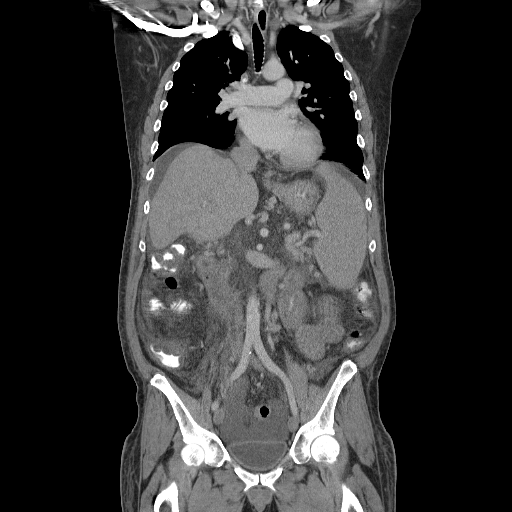

[16 of 46 positions shown; findings below may reference images not displayed]

FINDINGS: Trace bilateral pleural effusions with associated lower
lobe atelectasis.  The lungs are otherwise essentially clear.  No
pneumothorax.

Visualized thyroid is unremarkable.

The heart is top normal in size.  No pericardial effusion.

No suspicious mediastinal, hilar, or axillary lymphadenopathy.

Visualized osseous structures are within normal limits.
IMPRESSION: Trace bilateral pleural effusions with associated lower lobe
atelectasis.

CT ABDOMEN AND PELVIS
FINDINGS: Tiny hiatal hernia.

Cirrhotic configuration.  No focal hepatic lesions are seen.
Perihepatic ascites.

Splenomegaly, measuring 18.7 cm in maximal dimension.

Pancreas and adrenal glands are within normal limits.

Cholelithiasis.  Mild pericholecystic fluid, nonspecific in the
setting of ascites.

Kidneys are within normal limits.  No hydronephrosis.

No evidence of small bowel obstruction.  Mucosal wall thickening
involving a long segment of the right colon (series 2/image 73).

Mild atherosclerotic calcifications of the aorta and branch
vessels.

Gastroesophageal and perisplenic varices.

Moderate abdominopelvic ascites.

No suspicious abdominopelvic ascites.

Prostate is unremarkable.

Bladder is within normal limits.

Mild degenerative changes of the lumbar spine.
IMPRESSION: Cirrhosis with stigmata of portal hypertension including
splenomegaly, gastroesophageal/perisplenic varices, and moderate
abdominopelvic ascites.

Mucosal thickening involving a long segment of the right colon.
While possibly exacerbated by ascites, this appearance is worrisome
for infectious/inflammatory colitis.

Cholelithiasis.  Mild pericholecystic fluid, nonspecific in the
setting of ascites.

## 2012-12-05 ENCOUNTER — Other Ambulatory Visit: Payer: Self-pay

## 2012-12-05 DIAGNOSIS — K7460 Unspecified cirrhosis of liver: Secondary | ICD-10-CM

## 2012-12-05 NOTE — Progress Notes (Signed)
Quick Note:  No ascites or mass Will advise this and NH 3 levels on NH3 lab note ______

## 2012-12-05 NOTE — Progress Notes (Signed)
Quick Note:  Ammonia level high - supports need for Xifaxan to help clear toxins liver is not - needs to be sure to stay on lactulose also [place] was ok - cirrhosis and enlarged spleen as before - no fluid seen - weight gain is not fluid  REV with me August please  Repeat NH3 in 1 month ______

## 2012-12-06 NOTE — Progress Notes (Signed)
Patient ID: [name], male   DOB: [birth year], 47 y.o.   MRN: [id number] HIV f/u [name] was seen by me 11/24/12 for routine HIV care. He is virologically suppressed on Atripla. His most pressing problem is his advance alcoholic cirrhosis with protal HTN and documented ascites in past. He has chronic tremor that could have multiple causes but did not think that he has clearcut asterixis. But clearly his liver disease is most important issue. He has been alcohol free that best any of his providers including me can assess. His HIV disease is controlled so he may possibly be transplant candidate if he remains sober. His mother and father are with him on this visit and thanked me for care of [name]. [name] likely has major anxiety disorder that enhanced his alcoholism in my opinion. His chronic complaint of anxiety and insomnia led to my prescribing him amitryptaline that may need to be tapered/discontinued. His CD4 count is stable in low 200 range and likely is reflection of his cirrhosis and hyperslenism. He has had undetectable HIV.  Exam BP 136/82  Pulse 75  Temp(Src) 97.9 F (36.6 C) (Oral)  Wt 256 lb (116.121 kg)  BMI 35.96 kg/m2 [name] is alert and oriented today but has coarse tremor of his hands but not clearcut asterixis. No bruises or petechiae. No obvious ascites.  I&P HIV controled on present regimen. Cirrhosis most threat to him at present and under thoughtful care of Dr. Stan Head, [name] Anxiety/insomnia  Chronic problem an likely will need taper of amitryptline. Dr Daiva Eves will be seeing [name] after my retirement at the end of this month. [name]

## 2012-12-07 ENCOUNTER — Encounter (HOSPITAL_BASED_OUTPATIENT_CLINIC_OR_DEPARTMENT_OTHER): Payer: Medicaid Other | Attending: Internal Medicine

## 2012-12-07 DIAGNOSIS — L97509 Non-pressure chronic ulcer of other part of unspecified foot with unspecified severity: Secondary | ICD-10-CM | POA: Insufficient documentation

## 2012-12-07 DIAGNOSIS — Z21 Asymptomatic human immunodeficiency virus [HIV] infection status: Secondary | ICD-10-CM | POA: Insufficient documentation

## 2012-12-07 DIAGNOSIS — G589 Mononeuropathy, unspecified: Secondary | ICD-10-CM | POA: Insufficient documentation

## 2012-12-07 DIAGNOSIS — F1020 Alcohol dependence, uncomplicated: Secondary | ICD-10-CM | POA: Insufficient documentation

## 2012-12-07 DIAGNOSIS — Z79899 Other long term (current) drug therapy: Secondary | ICD-10-CM | POA: Insufficient documentation

## 2012-12-07 DIAGNOSIS — K7030 Alcoholic cirrhosis of liver without ascites: Secondary | ICD-10-CM | POA: Insufficient documentation

## 2012-12-08 NOTE — Progress Notes (Signed)
[facility]  NAME:  [name], [name]               ACCOUNT NO.:  [id number]  MEDICAL RECORD NO.:  [id number]      DATE OF BIRTH:  1965-07-21  PHYSICIAN:  Ardath Sax, M.D.      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  It is over his MP area and is about a cm in diameter.  Interestingly, about 8 months ago, he was here for an ulcer in the same area which was related to a puncture wound when he stepped on a nail.  This gentleman is HIV positive and has alcoholic cirrhosis and his neuropathy has been attributed to HIV.  MEDICATIONS:  He is on several medicines including Aldactone 50 mg a day, and for HIV, he is on Atripla 1 tablet a day.  He is also on Nexium.  PHYSICAL EXAMINATION:  On our examination here today, he is afebrile and has a pulse of 80, respirations 20, blood pressure is 121/79.  He weighs 240 pounds.  He is not a diabetic.  The wound was debrided with a scalpel, and I really just took it down to dermis.  He had healthy looking dermis and we are going to treat it by offloading and we also put some Silvercel over the wound.  He will come back here in a week.  DIAGNOSES: 1. Neuropathic ulcer, plantar aspect, left foot. 2. Human immunodeficiency virus positive. 3. Alcoholic cirrhosis.     Ardath Sax, M.D.     PP/MEDQ  D:  12/07/2012  T:  12/08/2012  Job:  [id number]

## 2012-12-14 ENCOUNTER — Encounter (HOSPITAL_COMMUNITY): Payer: Self-pay | Admitting: *Deleted

## 2012-12-14 ENCOUNTER — Encounter (HOSPITAL_BASED_OUTPATIENT_CLINIC_OR_DEPARTMENT_OTHER): Payer: Medicaid Other | Attending: General Surgery

## 2012-12-14 DIAGNOSIS — L97509 Non-pressure chronic ulcer of other part of unspecified foot with unspecified severity: Secondary | ICD-10-CM | POA: Insufficient documentation

## 2012-12-18 ENCOUNTER — Encounter (HOSPITAL_COMMUNITY): Payer: Self-pay | Admitting: Pharmacy Technician

## 2012-12-29 ENCOUNTER — Other Ambulatory Visit: Payer: Self-pay | Admitting: Infectious Diseases

## 2013-01-02 ENCOUNTER — Encounter (HOSPITAL_COMMUNITY): Payer: Self-pay | Admitting: *Deleted

## 2013-01-02 ENCOUNTER — Encounter (HOSPITAL_COMMUNITY): Payer: Self-pay | Admitting: Anesthesiology

## 2013-01-02 ENCOUNTER — Ambulatory Visit (HOSPITAL_COMMUNITY)
Admission: RE | Admit: 2013-01-02 | Discharge: 2013-01-02 | Disposition: A | Discharge status: Home / Self Care | Payer: Medicaid Other | Source: Ambulatory Visit | Attending: Internal Medicine | Admitting: Internal Medicine

## 2013-01-02 ENCOUNTER — Encounter (HOSPITAL_COMMUNITY)
Admission: RE | Disposition: A | Discharge status: Home / Self Care | Payer: Self-pay | Source: Ambulatory Visit | Attending: Internal Medicine

## 2013-01-02 ENCOUNTER — Ambulatory Visit (HOSPITAL_COMMUNITY): Payer: Medicaid Other | Admitting: Anesthesiology

## 2013-01-02 DIAGNOSIS — I10 Essential (primary) hypertension: Secondary | ICD-10-CM | POA: Insufficient documentation

## 2013-01-02 DIAGNOSIS — E669 Obesity, unspecified: Secondary | ICD-10-CM | POA: Insufficient documentation

## 2013-01-02 DIAGNOSIS — F17200 Nicotine dependence, unspecified, uncomplicated: Secondary | ICD-10-CM | POA: Insufficient documentation

## 2013-01-02 DIAGNOSIS — I8511 Secondary esophageal varices with bleeding: Secondary | ICD-10-CM | POA: Insufficient documentation

## 2013-01-02 DIAGNOSIS — K7030 Alcoholic cirrhosis of liver without ascites: Secondary | ICD-10-CM | POA: Insufficient documentation

## 2013-01-02 DIAGNOSIS — E785 Hyperlipidemia, unspecified: Secondary | ICD-10-CM | POA: Insufficient documentation

## 2013-01-02 DIAGNOSIS — K219 Gastro-esophageal reflux disease without esophagitis: Secondary | ICD-10-CM | POA: Insufficient documentation

## 2013-01-02 DIAGNOSIS — K766 Portal hypertension: Secondary | ICD-10-CM | POA: Diagnosis present

## 2013-01-02 DIAGNOSIS — K3189 Other diseases of stomach and duodenum: Secondary | ICD-10-CM

## 2013-01-02 DIAGNOSIS — B20 Human immunodeficiency virus [HIV] disease: Secondary | ICD-10-CM | POA: Insufficient documentation

## 2013-01-02 DIAGNOSIS — F1020 Alcohol dependence, uncomplicated: Secondary | ICD-10-CM | POA: Insufficient documentation

## 2013-01-02 DIAGNOSIS — K319 Disease of stomach and duodenum, unspecified: Secondary | ICD-10-CM | POA: Insufficient documentation

## 2013-01-02 DIAGNOSIS — I8500 Esophageal varices without bleeding: Secondary | ICD-10-CM

## 2013-01-02 HISTORY — DX: Other diseases of stomach and duodenum: K31.89

## 2013-01-02 HISTORY — PX: ESOPHAGOGASTRODUODENOSCOPY: SHX5428

## 2013-01-02 HISTORY — PX: GASTRIC VARICES BANDING: SHX5519

## 2013-01-02 SURGERY — EGD (ESOPHAGOGASTRODUODENOSCOPY)
Anesthesia: Monitor Anesthesia Care

## 2013-01-02 MED ORDER — LACTATED RINGERS IV SOLN
INTRAVENOUS | Status: DC
  Administered 2013-01-02: 10:00:00 via INTRAVENOUS
  Administered 2013-01-02: 1000 mL via INTRAVENOUS

## 2013-01-02 MED ORDER — PROPOFOL 10 MG/ML IV BOLUS
INTRAVENOUS | Status: DC | PRN
  Administered 2013-01-02 (×3): 20 mg via INTRAVENOUS
  Administered 2013-01-02: 40 mg via INTRAVENOUS

## 2013-01-02 MED ORDER — FENTANYL CITRATE 0.05 MG/ML IJ SOLN
INTRAMUSCULAR | Status: DC | PRN
  Administered 2013-01-02 (×2): 25 ug via INTRAVENOUS

## 2013-01-02 MED ORDER — SODIUM CHLORIDE 0.9 % IV SOLN: INTRAVENOUS | Status: DC

## 2013-01-02 MED ORDER — ESOMEPRAZOLE MAGNESIUM 40 MG PO CPDR: 40.0000 mg | DELAYED_RELEASE_CAPSULE | Freq: Every day | ORAL | Status: DC

## 2013-01-02 MED ORDER — MIDAZOLAM HCL 5 MG/5ML IJ SOLN
INTRAMUSCULAR | Status: DC | PRN
  Administered 2013-01-02 (×3): 1 mg via INTRAVENOUS

## 2013-01-02 MED ORDER — KETAMINE HCL 10 MG/ML IJ SOLN
INTRAMUSCULAR | Status: DC | PRN
  Administered 2013-01-02 (×2): 10 mg via INTRAVENOUS

## 2013-01-02 NOTE — Anesthesia Postprocedure Evaluation (Signed)
Anesthesia Post Note  Patient: [name]  Procedure(s) Performed: Procedure(s) (LRB): ESOPHAGOGASTRODUODENOSCOPY (EGD) (N/A) GASTRIC VARICES BANDING (N/A)  Anesthesia type: MAC  Patient location: PACU  Post pain: Pain level controlled  Post assessment: Post-op Vital signs reviewed  Last Vitals: BP 133/85  Temp(Src) 36.4 C (Oral)  Resp 14  Ht 5\' 10"  (1.778 m)  Wt 240 lb (108.863 kg)  BMI 34.44 kg/m2  SpO2 97%  Post vital signs: Reviewed  Level of consciousness: awake  Complications: No apparent anesthesia complications

## 2013-01-02 NOTE — Interval H&P Note (Signed)
History and Physical Interval Note:  01/02/2013 9:42 AM  [name]  has presented today for surgery, with the diagnosis of Esophageal varices [456.1]  The various methods of treatment have been discussed with the patient and family. After consideration of risks, benefits and other options for treatment, the patient has consented to  Procedure(s) with comments: ESOPHAGOGASTRODUODENOSCOPY (EGD) (N/A) GASTRIC VARICES BANDING (N/A) - possible banding as a surgical intervention .  The patient's history has been reviewed, patient examined, no change in status, stable for surgery.  I have reviewed the patient's chart and labs.  Questions were answered to the patient's satisfaction.     Stan Head

## 2013-01-02 NOTE — H&P (Signed)
[facility]  Primary Care Physician:  Alinda Deem, MD Primary Gastroenterologist:  Dr.Ravyn Nikkel  Reason for EGD: Follow-up esophageal varices  Recommendations: Proceed w/ EGD     HPI: [name] is a 47 y.o. male with cirrhosis here for reassessment/surveillance of esophageal varices. He has a history of prior esophageal variceal bleeding.   Past Medical History  Diagnosis Date  . HIV DISEASE 06/23/2006  . HYPERLIPIDEMIA 06/23/2006  . ANXIETY 06/23/2006  . DEPRESSION 06/23/2006  . 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 HYPERTENSION 06/23/2006  . 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 Ascites 11/13/2009  . STRAIN, CHEST WALL 03/15/2007  . 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 Gastric ulcer 06/2010  . Alcoholic cirrhosis of liver   . Alcoholism, chronic   . Iron deficiency anemia   . GERD (gastroesophageal reflux disease)   . SBP (spontaneous bacterial peritonitis) 05/03/2011    Suspected by high leukocytes on paracentesis. Clinical scenario also compatible. November 2012 responded to Levaquin. Started on trimethoprim-sulfamethoxazole double strength daily for prophylaxis.   . Obesity (BMI 30-39.9)     BMI 34 kg/m^2  . Left foot infection   . Skin cancer     left calf    Past Surgical History  Procedure Laterality Date  . Esophagogastroduodenoscopy w/ banding  06/26/2010    variceal ligation  . Carpal tunnel release      left  . Esophagogastroduodenoscopy  07/01/2010;  08/12/10    small varices, gastric ulcer  . Esophagogastroduodenoscopy  10/26/2011    Procedure: ESOPHAGOGASTRODUODENOSCOPY (EGD);  Surgeon: Iva Boop, MD;  Location: [facility];  Service: Endoscopy;  Laterality: N/A;  . Upper gastrointestinal endoscopy    . Colonscopy  march 2013    Prior to Admission medications   Medication Sig Start Date End Date Taking? Authorizing Provider   ALPRAZolam Prudy Feeler) 1 MG tablet Take 1 mg by mouth 2 (two) times daily.   Yes Historical Provider, MD  amitriptyline (ELAVIL) 50 MG tablet TAKE 1 TABLET BY MOUTH AT BEDTIME 12/29/12  Yes Ginnie Smart, MD  B Complex-C (B-COMPLEX WITH VITAMIN C) tablet Take 1 tablet by mouth daily.   Yes Historical Provider, MD  efavirenz-emtricitabine-tenofovir (ATRIPLA) 600-200-300 MG per tablet Take 1 tablet by mouth at bedtime.   Yes Historical Provider, MD  esomeprazole (NEXIUM) 40 MG capsule Take 40 mg by mouth 2 (two) times daily.   Yes Historical Provider, MD  furosemide (LASIX) 40 MG tablet Take 40 mg by mouth 2 (two) times daily.   Yes Historical Provider, MD  lactulose (CHRONULAC) 10 GM/15ML solution Take 30 mLs (20 g total) by mouth 4 (four) times daily. 11/20/12  Yes Iva Boop, MD  potassium chloride SA (K-DUR,KLOR-CON) 20 MEQ tablet Take 20 mEq by mouth daily.   Yes Historical Provider, MD  sulfamethoxazole-trimethoprim (BACTRIM DS) 800-160 MG per tablet Take 1 tablet by mouth daily. 09/27/12  Yes [name], MD  thiamine (VITAMIN B-1) 50 MG tablet Take 50 mg by mouth daily.   Yes Historical Provider, MD  acyclovir (ZOVIRAX) 400 MG tablet Take 400 mg by mouth 2 (two) times daily as needed (cold sores).  10/29/11   Historical Provider, MD    Current Facility-Administered Medications  Medication Dose Route Frequency Provider Last Rate Last Dose  . 0.9 %  sodium chloride infusion   Intravenous Continuous Iva Boop, MD      . lactated ringers infusion   Intravenous Continuous  Gaylan Gerold, MD 20 mL/hr at 01/02/13 0943 1,000 mL at 01/02/13 0943    Allergies as of 11/27/2012 - Review Complete 11/27/2012  Allergen Reaction Noted  . Penicillins      Family History  Problem Relation Age of Onset  . Hyperlipidemia Mother   . Hypertension Mother   . Hypertension Father   . Drug abuse Brother   . Breast cancer Maternal Aunt     maternal great aunt  . Colon cancer Neg Hx   . Alcohol  abuse Other   . Breast cancer Mother     questionable  . Heart disease Maternal Uncle   . Irritable bowel syndrome Father   . Irritable bowel syndrome Paternal Aunt     History   Social History  . Marital Status: Single    Spouse Name: N/A    Number of Children: N/A  . Years of Education: N/A   Occupational History  . disability    Social History Main Topics  . Smoking status: Current Every Day Smoker -- 0.10 packs/day for 10 years    Types: Cigarettes  . Smokeless tobacco: Never Used     Comment: form given 05-19-11  . Alcohol Use: No     Comment: pt is an alcoholic currently in [employer]  . Drug Use: No  . Sexually Active: Yes    Birth Control/ Protection: Condom     Comment: pt. declined condoms   Other Topics Concern  . Not on file   Social History Narrative  . No narrative on file    Review of Systems: Positive for weight gain All other review of systems negative except as mentioned in the HPI.  Physical Exam: Vital signs in last 24 hours: Temp:  [97.6 F (36.4 C)] 97.6 F (36.4 C) (07/22 0924) Resp:  [15] 15 (07/22 0924) BP: (118)/(73) 118/73 mmHg (07/22 0924) SpO2:  [97 %] 97 % (07/22 0924) Weight:  [240 lb (108.863 kg)] 240 lb (108.863 kg) (07/22 0924)   General:   Alert,  Well-developed, well-nourished, pleasant and cooperative in NAD Eyes:  Sclera clear, no icterus.   Conjunctiva pink. Lungs:  Clear throughout to auscultation.   Heart:  Regular rate and rhythm; no murmurs, clicks, rubs,  or gallops. Abdomen:  Soft, nontender and nondistended. Normal bowel sounds, without guarding, and without rebound.   Neurologic:  Alert and  oriented x 3 Psych:  Alert and cooperative. Normal mood and affect.   LOS: 0 days   @Zilla Shartzer  Sena Slate, MD, [facility] [phone] (pager) 01/02/2013 9:43 AM@

## 2013-01-02 NOTE — Transfer of Care (Signed)
Immediate Anesthesia Transfer of Care Note  Patient: [name]  Procedure(s) Performed: Procedure(s) with comments: ESOPHAGOGASTRODUODENOSCOPY (EGD) (N/A) GASTRIC VARICES BANDING (N/A) - possible banding  Patient Location: PACU and Endoscopy Unit  Anesthesia Type:MAC  Level of Consciousness: awake, alert , oriented and patient cooperative  Airway & Oxygen Therapy: Patient Spontanous Breathing and Patient connected to face mask oxygen  Post-op Assessment: Report given to PACU RN, Post -op Vital signs reviewed and stable and Patient moving all extremities  Post vital signs: Reviewed and stable  Complications: No apparent anesthesia complications

## 2013-01-02 NOTE — Op Note (Signed)
[facility] [address] [place], [address]   ENDOSCOPY PROCEDURE REPORT  PATIENT: [name], [name]  MR#: [id number] BIRTHDATE: 08/22/1965 , 47  yrs. old GENDER: Male ENDOSCOPIST: Iva Boop, MD, [facility] PROCEDURE DATE:  01/02/2013 PROCEDURE:  EGD w/ biopsy and EGD w/ band ligation of varices ASA CLASS:     Class III INDICATIONS:  Surveillance of esophageal varices with a history of bleeding MEDICATIONS: MAC sedation, administered by CRNA TOPICAL ANESTHETIC: Cetacaine Spray  DESCRIPTION OF PROCEDURE: After the risks benefits and alternatives of the procedure were thoroughly explained, informed consent was obtained.  The Pentax Gastroscope [id number] endoscope was introduced through the mouth and advanced to the second portion of the duodenum. Without limitations.  The instrument was slowly withdrawn as the mucosa was fully examined.      ESOPHAGUS: There were 2 columns of small varices in the distal third of the esophagus.  The varices were not actively bleeding.  Band ligation x 3 was performed, 2 on one column and one on other, given hx of prior bleeding. Max size was 2+.  STOMACH: Moderate and congested gastropathy was found in the gastric antrum.  There was adherent blood present.  There were erosions present.  Multiple biopsies were performed using cold forceps. Sample sent for histology.  The remainder of the upper endoscopy exam was otherwise normal. Retroflexed views revealed no abnormalities.     The scope was then withdrawn from the patient and the procedure completed.  COMPLICATIONS: There were no complications. ENDOSCOPIC IMPRESSION: 1.   There were 2 columns of small varices in the distal third of the esophagus; The varices were; band ligation x 3 was performed 2.   Gastropathy was found in the gastric antrum; multiple biopsies 3.   The remainder of the upper endoscopy exam was otherwise normal  RECOMMENDATIONS: 1.  Office will call  with results and plans for timing of next endoscopy 2.   Refill Nexium 40 mg daily, consider adding sucralfate vs.  bid PPI again depending upon biopsy results.  eSigned:  Iva Boop, MD, [facility] 01/02/2013 11:03 AM  XB:JYNWGN Belva Crome, MD

## 2013-01-02 NOTE — Anesthesia Preprocedure Evaluation (Addendum)
Anesthesia Evaluation  Patient identified by MRN, date of birth, ID band Patient awake    Reviewed: Allergy & Precautions, H&P , NPO status , Patient's Chart, lab work & pertinent test results  Airway Mallampati: II TM Distance: >3 FB Neck ROM: Full    Dental  (+) Dental Advisory Given and Teeth Intact   Pulmonary Current Smoker,  breath sounds clear to auscultation        Cardiovascular hypertension, Pt. on medications negative cardio ROS  Rhythm:Regular Rate:Normal     Neuro/Psych PSYCHIATRIC DISORDERS Anxiety Depression Idiopathic autonomic neuropathy    GI/Hepatic PUD, GERD-  Medicated,(+) Cirrhosis -  Esophageal Varices and ascites  substance abuse  alcohol use,   Endo/Other  negative endocrine ROS  Renal/GU negative Renal ROS     Musculoskeletal negative musculoskeletal ROS (+)   Abdominal (+) + obese,   Peds  Hematology negative hematology ROS (+) HIV,   Anesthesia Other Findings   Reproductive/Obstetrics                          Anesthesia Physical Anesthesia Plan  ASA: III  Anesthesia Plan: MAC   Post-op Pain Management:    Induction: Intravenous  Airway Management Planned: Natural Airway and Simple Face Mask  Additional Equipment:   Intra-op Plan:   Post-operative Plan:   Informed Consent: I have reviewed the patients History and Physical, chart, labs and discussed the procedure including the risks, benefits and alternatives for the proposed anesthesia with the patient or authorized representative who has indicated his/her understanding and acceptance.   Dental advisory given  Plan Discussed with: CRNA  Anesthesia Plan Comments:         Anesthesia Quick Evaluation

## 2013-01-02 NOTE — Preoperative (Signed)
Beta Blockers   Reason not to administer Beta Blockers:Not Applicable 

## 2013-01-02 NOTE — H&P (View-Only) (Signed)
Quick Note:  No ascites or mass Will advise this and NH 3 levels on NH3 lab note ______

## 2013-01-03 ENCOUNTER — Encounter (HOSPITAL_COMMUNITY): Payer: Self-pay | Admitting: Internal Medicine

## 2013-01-07 ENCOUNTER — Encounter: Payer: Self-pay | Admitting: Internal Medicine

## 2013-01-08 ENCOUNTER — Telehealth: Payer: Self-pay | Admitting: Internal Medicine

## 2013-01-08 NOTE — Telephone Encounter (Signed)
Patient advised of results and that I am waiting for Dr. Leone Payor to decide on a date for repeat EGD and that I will be in touch

## 2013-01-09 ENCOUNTER — Telehealth: Payer: Self-pay

## 2013-01-09 NOTE — Telephone Encounter (Signed)
I have sent myself a reminder to call in the beginning of October when the next schedule comes out. Patient aware.

## 2013-01-09 NOTE — Telephone Encounter (Signed)
Message copied by Annett Fabian on Tue Jan 09, 2013  9:00 AM ------      Message from: Iva Boop      Created: Tue Jan 09, 2013  8:30 AM       Let's do November - that should be fine      ----- Message -----         From: Rossie Muskrat, RN         Sent: 01/08/2013  11:54 AM           To: Iva Boop, MD            You have hospital time the beginning of September for repeat banding which is only a month, you have no hospital time again until I am assuming November.  Do you want me to set him up for September or wait until November hospital week?       ------

## 2013-01-11 ENCOUNTER — Other Ambulatory Visit: Payer: Self-pay | Admitting: Internal Medicine

## 2013-01-11 NOTE — Telephone Encounter (Signed)
May we refill [name]? 

## 2013-01-11 NOTE — Telephone Encounter (Signed)
OK to refill for 6 months 

## 2013-01-17 ENCOUNTER — Other Ambulatory Visit: Payer: Self-pay | Admitting: Internal Medicine

## 2013-01-17 NOTE — Telephone Encounter (Signed)
Ok to refill [name]? 

## 2013-01-17 NOTE — Telephone Encounter (Signed)
Refill x 6 months 

## 2013-01-25 ENCOUNTER — Encounter (HOSPITAL_BASED_OUTPATIENT_CLINIC_OR_DEPARTMENT_OTHER): Payer: Medicaid Other | Attending: General Surgery

## 2013-01-25 DIAGNOSIS — L97409 Non-pressure chronic ulcer of unspecified heel and midfoot with unspecified severity: Secondary | ICD-10-CM | POA: Insufficient documentation

## 2013-02-09 ENCOUNTER — Other Ambulatory Visit: Payer: Self-pay | Admitting: *Deleted

## 2013-02-09 DIAGNOSIS — K659 Peritonitis, unspecified: Secondary | ICD-10-CM

## 2013-02-09 MED ORDER — SULFAMETHOXAZOLE-TMP DS 800-160 MG PO TABS: 1.0000 | ORAL_TABLET | Freq: Every day | ORAL | Status: DC

## 2013-02-13 ENCOUNTER — Other Ambulatory Visit: Payer: Self-pay | Admitting: Licensed Clinical Social Worker

## 2013-02-13 ENCOUNTER — Other Ambulatory Visit: Payer: Self-pay | Admitting: *Deleted

## 2013-02-13 DIAGNOSIS — K659 Peritonitis, unspecified: Secondary | ICD-10-CM

## 2013-02-13 NOTE — Telephone Encounter (Signed)
Patient called c/o about labs done at his pcp for an injured foot. His PCP was c/o about his low platelets and asked the patient to f/u with

## 2013-02-14 ENCOUNTER — Other Ambulatory Visit: Payer: Medicaid Other

## 2013-02-15 ENCOUNTER — Other Ambulatory Visit (INDEPENDENT_AMBULATORY_CARE_PROVIDER_SITE_OTHER): Payer: Medicaid Other

## 2013-02-15 ENCOUNTER — Encounter (HOSPITAL_BASED_OUTPATIENT_CLINIC_OR_DEPARTMENT_OTHER): Payer: Medicaid Other | Attending: Internal Medicine

## 2013-02-15 DIAGNOSIS — B20 Human immunodeficiency virus [HIV] disease: Secondary | ICD-10-CM

## 2013-02-15 DIAGNOSIS — L89899 Pressure ulcer of other site, unspecified stage: Secondary | ICD-10-CM | POA: Insufficient documentation

## 2013-02-15 DIAGNOSIS — G5790 Unspecified mononeuropathy of unspecified lower limb: Secondary | ICD-10-CM | POA: Insufficient documentation

## 2013-02-15 DIAGNOSIS — L8993 Pressure ulcer of unspecified site, stage 3: Secondary | ICD-10-CM | POA: Insufficient documentation

## 2013-02-15 DIAGNOSIS — Z113 Encounter for screening for infections with a predominantly sexual mode of transmission: Secondary | ICD-10-CM

## 2013-02-15 LAB — CBC WITH DIFFERENTIAL/PLATELET
Eosinophils Absolute: 0.1 10*3/uL (ref 0.0–0.7)
Eosinophils Relative: 4 % (ref 0–5)
HCT: 39.8 % (ref 39.0–52.0)
Hemoglobin: 13.6 g/dL (ref 13.0–17.0)
Lymphocytes Relative: 30 % (ref 12–46)
Lymphs Abs: 1 10*3/uL (ref 0.7–4.0)
MCH: 30 pg (ref 26.0–34.0)
MCV: 87.7 fL (ref 78.0–100.0)
Monocytes Relative: 11 % (ref 3–12)
Neutrophils Relative %: 54 % (ref 43–77)
RBC: 4.54 MIL/uL (ref 4.22–5.81)
WBC: 3.3 10*3/uL — ABNORMAL LOW (ref 4.0–10.5)

## 2013-02-15 LAB — COMPREHENSIVE METABOLIC PANEL
Alkaline Phosphatase: 113 U/L (ref 39–117)
BUN: 10 mg/dL (ref 6–23)
CO2: 20 mEq/L (ref 19–32)
Creat: 0.81 mg/dL (ref 0.50–1.35)
Glucose, Bld: 115 mg/dL — ABNORMAL HIGH (ref 70–99)
Total Bilirubin: 1.1 mg/dL (ref 0.3–1.2)

## 2013-02-15 LAB — RPR

## 2013-02-16 LAB — T-HELPER CELL (CD4) - (RCID CLINIC ONLY)
CD4 % Helper T Cell: 23 % — ABNORMAL LOW (ref 33–55)
CD4 T Cell Abs: 240 /uL — ABNORMAL LOW (ref 400–2700)

## 2013-02-16 LAB — HIV-1 RNA QUANT-NO REFLEX-BLD
HIV 1 RNA Quant: <20 copies/mL (ref <20)
HIV-1 RNA Quant, Log: <1.3 {Log} (ref <1.30)

## 2013-02-20 ENCOUNTER — Other Ambulatory Visit: Payer: Self-pay | Admitting: *Deleted

## 2013-02-20 DIAGNOSIS — G4700 Insomnia, unspecified: Secondary | ICD-10-CM

## 2013-02-20 MED ORDER — AMITRIPTYLINE HCL 50 MG PO TABS: 50.0000 mg | ORAL_TABLET | Freq: Every evening | ORAL | Status: DC | PRN

## 2013-02-28 ENCOUNTER — Other Ambulatory Visit: Payer: Self-pay

## 2013-02-28 ENCOUNTER — Ambulatory Visit: Payer: Medicaid Other | Admitting: Infectious Disease

## 2013-03-01 ENCOUNTER — Ambulatory Visit
Admission: RE | Admit: 2013-03-01 | Discharge: 2013-03-01 | Disposition: A | Discharge status: Home / Self Care | Payer: Medicaid Other | Source: Ambulatory Visit | Attending: Infectious Disease | Admitting: Infectious Disease

## 2013-03-01 ENCOUNTER — Encounter: Payer: Self-pay | Admitting: Infectious Disease

## 2013-03-01 ENCOUNTER — Ambulatory Visit (INDEPENDENT_AMBULATORY_CARE_PROVIDER_SITE_OTHER): Payer: Medicaid Other | Admitting: Infectious Disease

## 2013-03-01 VITALS — BP 136/77 | HR 78 | Temp 98.2°F | Ht 71.0 in | Wt 267.0 lb

## 2013-03-01 DIAGNOSIS — B20 Human immunodeficiency virus [HIV] disease: Secondary | ICD-10-CM

## 2013-03-01 DIAGNOSIS — L97529 Non-pressure chronic ulcer of other part of left foot with unspecified severity: Secondary | ICD-10-CM

## 2013-03-01 DIAGNOSIS — I8500 Esophageal varices without bleeding: Secondary | ICD-10-CM

## 2013-03-01 DIAGNOSIS — F4323 Adjustment disorder with mixed anxiety and depressed mood: Secondary | ICD-10-CM

## 2013-03-01 DIAGNOSIS — F329 Major depressive disorder, single episode, unspecified: Secondary | ICD-10-CM

## 2013-03-01 DIAGNOSIS — Z23 Encounter for immunization: Secondary | ICD-10-CM

## 2013-03-01 DIAGNOSIS — G4700 Insomnia, unspecified: Secondary | ICD-10-CM

## 2013-03-01 DIAGNOSIS — L97509 Non-pressure chronic ulcer of other part of unspecified foot with unspecified severity: Secondary | ICD-10-CM

## 2013-03-01 NOTE — Progress Notes (Signed)
Subjective:    Patient ID: [name], male    DOB: 09-19-1965, 47 y.o.   MRN: [id number]  HPI  [name] is a 47 y.o. male with HIV infection who is doing superbly well on their  antiviral regimen, Atripla with undetectable viral load and Cd4 count above 200. He has begun taking Atripla during the day to take it better on empty stomach. I suggested potential changing him to [facility] or TRIUMEQ provided he had no sig R. He had initially had therapy via clinical trial at [facility] with no virological failure to his knowledge.   He is followed closely by Dr. Leone Payor who performed another EGD recently. The patient has alcoholic cirrhosis and has been etoh free for a number or years.   He still suffers from anxiety and depression and insomnia. He asked for additional sedatingmeds and i suggested that he simply use his xanax and that we might need a longer acting benzo rather than xanax  Finally he had told me of his right foot wound that has been present since October 2013 and cared for in the wound clinic. He believes no imaging ever done of this area.     Review of Systems  Constitutional: Negative for fever, chills, diaphoresis, activity change, appetite change, fatigue and unexpected weight change.  HENT: Negative for congestion, sore throat, rhinorrhea, sneezing, trouble swallowing and sinus pressure.   Eyes: Negative for photophobia and visual disturbance.  Respiratory: Negative for cough, chest tightness, shortness of breath, wheezing and stridor.   Cardiovascular: Negative for chest pain, palpitations and leg swelling.  Gastrointestinal: Negative for nausea, vomiting, abdominal pain, diarrhea, constipation, blood in stool, abdominal distention and anal bleeding.  Genitourinary: Negative for dysuria, hematuria, flank pain and difficulty urinating.  Musculoskeletal: Negative for myalgias, back pain, joint swelling, arthralgias and gait problem.  Skin: Positive for wound. Negative for  color change, pallor and rash.  Neurological: Negative for dizziness, tremors, weakness and light-headedness.  Hematological: Negative for adenopathy. Does not bruise/bleed easily.  Psychiatric/Behavioral: Positive for sleep disturbance. Negative for behavioral problems, confusion, dysphoric mood, decreased concentration and agitation. The patient is nervous/anxious.        Objective:   Physical Exam  Constitutional: He is oriented to person, place, and time. He appears well-developed and well-nourished. No distress.  HENT:  Head: Normocephalic and atraumatic.  Mouth/Throat: Oropharynx is clear and moist. No oropharyngeal exudate.  Eyes: Conjunctivae and EOM are normal. Pupils are equal, round, and reactive to light. No scleral icterus.  Neck: Normal range of motion. Neck supple. No JVD present.  Cardiovascular: Normal rate, regular rhythm and normal heart sounds.  Exam reveals no gallop and no friction rub.   No murmur heard. Pulmonary/Chest: Effort normal and breath sounds normal. No respiratory distress. He has no wheezes. He has no rales. He exhibits no tenderness.  Abdominal: Soft. He exhibits distension. He exhibits no mass. There is no tenderness. There is no rebound and no guarding.  Musculoskeletal: He exhibits no edema and no tenderness.       Feet:  Lymphadenopathy:    He has no cervical adenopathy.  Neurological: He is alert and oriented to person, place, and time. He exhibits normal muscle tone. Coordination normal.  Skin: Skin is warm and dry. He is not diaphoretic. No erythema. No pallor.  Psychiatric: He has a normal mood and affect. His behavior is normal. Judgment and thought content normal.          Assessment & Plan:  HIV: continue Atripla, consider change to STRIBILD vs TRIUMEQ  I spent greater than 45 minutes with the patient including greater than 50% of time in face to face counsel of the patient and in coordination of their care.   Etoh induced  cirrhosis: continue to follow with Dr. Leone Payor, sober still  Anxiety depression: continue current meds but would consider change to longer acting benzo possibly  Insomnia: take xanax at night,   Foot wound: concerned given NO resolution nearly a year for osteomyelitis: check plain films

## 2013-03-12 ENCOUNTER — Other Ambulatory Visit: Payer: Self-pay | Admitting: *Deleted

## 2013-03-12 ENCOUNTER — Telehealth: Payer: Self-pay | Admitting: Internal Medicine

## 2013-03-12 DIAGNOSIS — F411 Generalized anxiety disorder: Secondary | ICD-10-CM

## 2013-03-12 MED ORDER — ALPRAZOLAM 1 MG PO TABS: 1.0000 mg | ORAL_TABLET | Freq: Two times a day (BID) | ORAL | Status: DC

## 2013-03-12 NOTE — Telephone Encounter (Signed)
Left message for patient to call back  

## 2013-03-13 NOTE — Telephone Encounter (Signed)
Left message for patient to call back  

## 2013-03-15 ENCOUNTER — Telehealth: Payer: Self-pay

## 2013-03-15 NOTE — Telephone Encounter (Signed)
Left message for patient to call back to discuss scheduling the procedure for [facility]

## 2013-03-15 NOTE — Telephone Encounter (Signed)
No return calls.  I have a phone call into the patient to discuss scheduling a banding procedure.

## 2013-03-15 NOTE — Telephone Encounter (Signed)
Message copied by Annett Fabian on Thu Mar 15, 2013  9:45 AM ------      Message from: Annett Fabian      Created: Tue Jan 09, 2013  8:59 AM       Will need egd banding for hospital week in November.   ------

## 2013-03-16 ENCOUNTER — Other Ambulatory Visit: Payer: Self-pay | Admitting: *Deleted

## 2013-03-16 NOTE — Telephone Encounter (Signed)
Left message for patient to call back  

## 2013-03-19 NOTE — Telephone Encounter (Signed)
Patient is not returning phone calls.  Also see phone note from 03/12/13.  Neither had return phone call.  I have mailed a letter asking him to call and discuss scheduling a hospital banding procedure for November.

## 2013-03-22 ENCOUNTER — Encounter (HOSPITAL_BASED_OUTPATIENT_CLINIC_OR_DEPARTMENT_OTHER): Payer: Medicaid Other | Attending: Internal Medicine

## 2013-03-22 DIAGNOSIS — G589 Mononeuropathy, unspecified: Secondary | ICD-10-CM | POA: Insufficient documentation

## 2013-03-22 DIAGNOSIS — L8993 Pressure ulcer of unspecified site, stage 3: Secondary | ICD-10-CM | POA: Insufficient documentation

## 2013-03-22 DIAGNOSIS — L89899 Pressure ulcer of other site, unspecified stage: Secondary | ICD-10-CM | POA: Insufficient documentation

## 2013-03-26 NOTE — Telephone Encounter (Signed)
Patient called called back and is scheduled for EGD with banding [facility] 04/23/13 10:15.  He is aware to arrive at 9:00 in admitting and be NPO after midnight

## 2013-04-23 ENCOUNTER — Ambulatory Visit (HOSPITAL_COMMUNITY)
Admission: RE | Admit: 2013-04-23 | Discharge: 2013-04-23 | Disposition: A | Discharge status: Home / Self Care | Payer: Medicaid Other | Source: Ambulatory Visit | Attending: Internal Medicine | Admitting: Internal Medicine

## 2013-04-23 ENCOUNTER — Encounter (HOSPITAL_COMMUNITY)
Admission: RE | Disposition: A | Discharge status: Home / Self Care | Payer: Medicaid Other | Source: Ambulatory Visit | Attending: Internal Medicine

## 2013-04-23 ENCOUNTER — Encounter (HOSPITAL_COMMUNITY): Payer: Self-pay | Admitting: *Deleted

## 2013-04-23 DIAGNOSIS — K219 Gastro-esophageal reflux disease without esophagitis: Secondary | ICD-10-CM | POA: Insufficient documentation

## 2013-04-23 DIAGNOSIS — F17200 Nicotine dependence, unspecified, uncomplicated: Secondary | ICD-10-CM | POA: Insufficient documentation

## 2013-04-23 DIAGNOSIS — E785 Hyperlipidemia, unspecified: Secondary | ICD-10-CM | POA: Insufficient documentation

## 2013-04-23 DIAGNOSIS — Z79899 Other long term (current) drug therapy: Secondary | ICD-10-CM | POA: Insufficient documentation

## 2013-04-23 DIAGNOSIS — Z21 Asymptomatic human immunodeficiency virus [HIV] infection status: Secondary | ICD-10-CM | POA: Insufficient documentation

## 2013-04-23 DIAGNOSIS — I8500 Esophageal varices without bleeding: Secondary | ICD-10-CM | POA: Insufficient documentation

## 2013-04-23 DIAGNOSIS — K7030 Alcoholic cirrhosis of liver without ascites: Secondary | ICD-10-CM | POA: Insufficient documentation

## 2013-04-23 DIAGNOSIS — Z538 Procedure and treatment not carried out for other reasons: Secondary | ICD-10-CM | POA: Insufficient documentation

## 2013-04-23 DIAGNOSIS — E669 Obesity, unspecified: Secondary | ICD-10-CM | POA: Insufficient documentation

## 2013-04-23 DIAGNOSIS — F1021 Alcohol dependence, in remission: Secondary | ICD-10-CM | POA: Insufficient documentation

## 2013-04-23 HISTORY — PX: ESOPHAGOGASTRODUODENOSCOPY: SHX5428

## 2013-04-23 HISTORY — DX: Disease of blood and blood-forming organs, unspecified: D75.9

## 2013-04-23 SURGERY — EGD (ESOPHAGOGASTRODUODENOSCOPY)
Anesthesia: Moderate Sedation

## 2013-04-23 MED ORDER — SODIUM CHLORIDE 0.9 % IV SOLN
INTRAVENOUS | Status: DC
  Administered 2013-04-23: 500 mL via INTRAVENOUS

## 2013-04-23 MED ORDER — SODIUM CHLORIDE 0.9 % IV SOLN: INTRAVENOUS | Status: DC

## 2013-04-23 MED ORDER — DIPHENHYDRAMINE HCL 50 MG/ML IJ SOLN
INTRAMUSCULAR | Status: AC
  Filled 2013-04-23: qty 1

## 2013-04-23 MED ORDER — FENTANYL CITRATE 0.05 MG/ML IJ SOLN
INTRAMUSCULAR | Status: AC
  Filled 2013-04-23: qty 2

## 2013-04-23 MED ORDER — MIDAZOLAM HCL 10 MG/2ML IJ SOLN
INTRAMUSCULAR | Status: AC
  Filled 2013-04-23: qty 2

## 2013-04-23 NOTE — Op Note (Signed)
EGD cancelled as not scheduled with MAC sedation. Rescheduled for 05/01/2013.  Iva Boop, MD, [facility]

## 2013-04-23 NOTE — H&P (Signed)
[facility] History and Physical   Primary Care Physician:  Alinda Deem, MD   Reason for Procedure:   F/U ESOPHAGEAL VARICES  Plan:    EGD     HPI: [name] is a 47 y.o. male WITH CIRRHOSIS AND KNOWN VARICES. HE IS STABLE AND HERE FOR SURVEILLANCE OF ESOPHAGEAL VARICES.   Past Medical History  Diagnosis Date  . HIV DISEASE 06/23/2006  . HYPERLIPIDEMIA 06/23/2006  . ANXIETY 06/23/2006  . DEPRESSION 06/23/2006  . 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 HYPERTENSION 06/23/2006  . 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 Ascites 11/13/2009  . STRAIN, CHEST WALL 03/15/2007  . 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 Gastric ulcer 06/2010  . Alcoholic cirrhosis of liver   . Alcoholism, chronic   . Iron deficiency anemia   . GERD (gastroesophageal reflux disease)   . SBP (spontaneous bacterial peritonitis) 05/03/2011    Suspected by high leukocytes on paracentesis. Clinical scenario also compatible. November 2012 responded to Levaquin. Started on trimethoprim-sulfamethoxazole double strength daily for prophylaxis.   . Obesity (BMI 30-39.9)     BMI 34 kg/m^2  . Left foot infection   . Skin cancer     left calf  . Blood dyscrasia     HIV    Past Surgical History  Procedure Laterality Date  . Esophagogastroduodenoscopy w/ banding  06/26/2010    variceal ligation  . Carpal tunnel release      left  . Esophagogastroduodenoscopy  07/01/2010;  08/12/10    small varices, gastric ulcer  . Esophagogastroduodenoscopy  10/26/2011    Procedure: ESOPHAGOGASTRODUODENOSCOPY (EGD);  Surgeon: Iva Boop, MD;  Location: [facility];  Service: Endoscopy;  Laterality: N/A;  . Upper gastrointestinal endoscopy    . Colonscopy  march 2013  . Esophagogastroduodenoscopy N/A 01/02/2013    Procedure: ESOPHAGOGASTRODUODENOSCOPY (EGD);  Surgeon: Iva Boop, MD;  Location: [facility];  Service: Endoscopy;  Laterality: N/A;  . Gastric varices banding N/A 01/02/2013    Procedure: GASTRIC VARICES BANDING;  Surgeon: Iva Boop, MD;  Location: [facility];  Service: Endoscopy;  Laterality: N/A;  possible banding    Prior to Admission medications   Medication Sig Start Date End Date Taking? Authorizing Provider  ALPRAZolam Prudy Feeler) 1 MG tablet Take 1 tablet (1 mg total) by mouth 2 (two) times daily. 03/12/13  Yes Randall Hiss, MD  aMILoride (MIDAMOR) 5 MG tablet TAKE TWO TABLETS BY MOUTH DAILY 01/17/13  Yes Iva Boop, MD  B Complex-C (B-COMPLEX WITH VITAMIN C) tablet Take 1 tablet by mouth daily.   Yes Historical Provider, MD  efavirenz-emtricitabine-tenofovir (ATRIPLA) 600-200-300 MG per tablet Take 1 tablet by mouth at bedtime.   Yes Historical Provider, MD  esomeprazole (NEXIUM) 40 MG capsule Take 1 capsule (40 mg total) by mouth daily before breakfast. 01/02/13  Yes Iva Boop, MD  furosemide (LASIX) 40 MG tablet TAKE 1 TABLET BY MOUTH TWICE DAILY 01/11/13  Yes Iva Boop, MD  lactulose (CHRONULAC) 10 GM/15ML solution Take 30 mLs (20 g total) by mouth 4 (four) times daily. 11/20/12  Yes Iva Boop, MD  potassium chloride SA (K-DUR,KLOR-CON) 20 MEQ tablet Take 20 mEq by mouth daily.   Yes Historical Provider, MD  thiamine (VITAMIN B-1) 50 MG tablet Take 50 mg by mouth daily.   Yes Historical Provider, MD  acyclovir (ZOVIRAX) 400 MG tablet Take 400 mg by mouth 2 (two) times  daily as needed (cold sores).  10/29/11   Historical Provider, MD  amitriptyline (ELAVIL) 50 MG tablet Take 1 tablet (50 mg total) by mouth at bedtime as needed for sleep. 02/20/13   Randall Hiss, MD    Current Facility-Administered Medications  Medication Dose Route Frequency Provider Last Rate Last Dose  . 0.9 %  sodium chloride infusion   Intravenous Continuous Iva Boop, MD 20 mL/hr at 04/23/13 0944 500 mL at 04/23/13 0944    Allergies as of 03/26/2013 - Review  Complete 03/01/2013  Allergen Reaction Noted  . Penicillins      Family History  Problem Relation Age of Onset  . Hyperlipidemia Mother   . Hypertension Mother   . Hypertension Father   . Drug abuse Brother   . Breast cancer Maternal Aunt     maternal great aunt  . Colon cancer Neg Hx   . Alcohol abuse Other   . Breast cancer Mother     questionable  . Heart disease Maternal Uncle   . Irritable bowel syndrome Father   . Irritable bowel syndrome Paternal Aunt     History   Social History  . Marital Status: Single    Spouse Name: N/A    Number of Children: N/A  . Years of Education: N/A   Occupational History  . disability    Social History Main Topics  . Smoking status: Current Every Day Smoker -- 0.10 packs/day for 10 years    Types: Cigarettes  . Smokeless tobacco: Never Used     Comment: form given 05-19-11  . Alcohol Use: No     Comment: pt is an alcoholic currently in [employer]  . Drug Use: No  . Sexual Activity: Yes    Birth Control/ Protection: Condom     Comment: pt. declined condoms   Other Topics Concern  . Not on file   Social History Narrative  . No narrative on file    Review of Systems: All other review of systems negative except as mentioned in the HPI.  Physical Exam: Vital signs in last 24 hours: Temp:  [98.2 F (36.8 C)] 98.2 F (36.8 C) (11/10 0938) Resp:  [17] 17 (11/10 0938) BP: (129)/(79) 129/79 mmHg (11/10 0938) SpO2:  [98 %] 98 % (11/10 0938) Weight:  [235 lb (106.595 kg)] 235 lb (106.595 kg) (11/10 1610)   General:   Alert,  Well-developed, well-nourished, pleasant and cooperative in NAD Lungs:  Clear throughout to auscultation.   Heart:  Regular rate and rhythm; no murmurs, clicks, rubs,  or gallops. Abdomen:  Soft, nontender and nondistended. Normal bowel sounds.   Neuro/Psych:  Alert and cooperative. Normal mood and affect. A and O x 3   @Carl Deal  Sena Slate, MD, [facility] [phone]  (pager) 04/23/2013 9:55 AM@

## 2013-04-24 ENCOUNTER — Encounter (HOSPITAL_COMMUNITY): Payer: Self-pay | Admitting: Internal Medicine

## 2013-04-24 ENCOUNTER — Encounter (HOSPITAL_COMMUNITY): Payer: Self-pay | Admitting: Pharmacy Technician

## 2013-04-24 NOTE — Progress Notes (Signed)
04-24-13 Procedure not done on 04-23-13 due to anesthesia not available per patient.

## 2013-05-01 ENCOUNTER — Encounter (HOSPITAL_COMMUNITY): Payer: Self-pay | Admitting: *Deleted

## 2013-05-01 ENCOUNTER — Ambulatory Visit (HOSPITAL_COMMUNITY): Payer: Medicaid Other | Admitting: Anesthesiology

## 2013-05-01 ENCOUNTER — Ambulatory Visit (HOSPITAL_COMMUNITY)
Admission: RE | Admit: 2013-05-01 | Discharge: 2013-05-01 | Disposition: A | Discharge status: Home / Self Care | Payer: Medicaid Other | Source: Ambulatory Visit | Attending: Internal Medicine | Admitting: Internal Medicine

## 2013-05-01 ENCOUNTER — Encounter (HOSPITAL_COMMUNITY): Payer: Medicaid Other | Admitting: Anesthesiology

## 2013-05-01 ENCOUNTER — Encounter (HOSPITAL_COMMUNITY)
Admission: RE | Disposition: A | Discharge status: Home / Self Care | Payer: Self-pay | Source: Ambulatory Visit | Attending: Internal Medicine

## 2013-05-01 DIAGNOSIS — I8500 Esophageal varices without bleeding: Secondary | ICD-10-CM

## 2013-05-01 DIAGNOSIS — K319 Disease of stomach and duodenum, unspecified: Secondary | ICD-10-CM | POA: Insufficient documentation

## 2013-05-01 DIAGNOSIS — Z9283 Personal history of failed moderate sedation: Secondary | ICD-10-CM

## 2013-05-01 DIAGNOSIS — K219 Gastro-esophageal reflux disease without esophagitis: Secondary | ICD-10-CM | POA: Insufficient documentation

## 2013-05-01 DIAGNOSIS — K7030 Alcoholic cirrhosis of liver without ascites: Secondary | ICD-10-CM | POA: Insufficient documentation

## 2013-05-01 DIAGNOSIS — Z21 Asymptomatic human immunodeficiency virus [HIV] infection status: Secondary | ICD-10-CM | POA: Insufficient documentation

## 2013-05-01 DIAGNOSIS — I10 Essential (primary) hypertension: Secondary | ICD-10-CM | POA: Insufficient documentation

## 2013-05-01 DIAGNOSIS — K766 Portal hypertension: Secondary | ICD-10-CM | POA: Insufficient documentation

## 2013-05-01 DIAGNOSIS — Z8711 Personal history of peptic ulcer disease: Secondary | ICD-10-CM | POA: Insufficient documentation

## 2013-05-01 HISTORY — DX: Other ascites: R18.8

## 2013-05-01 HISTORY — DX: Portal hypertension: K76.6

## 2013-05-01 HISTORY — DX: Personal history of failed moderate sedation: Z92.83

## 2013-05-01 HISTORY — PX: ESOPHAGEAL BANDING: SHX5518

## 2013-05-01 HISTORY — PX: ESOPHAGOGASTRODUODENOSCOPY: SHX5428

## 2013-05-01 HISTORY — DX: Other diseases of stomach and duodenum: K31.89

## 2013-05-01 SURGERY — EGD (ESOPHAGOGASTRODUODENOSCOPY)
Anesthesia: General

## 2013-05-01 MED ORDER — PROMETHAZINE HCL 25 MG/ML IJ SOLN: 6.2500 mg | INTRAMUSCULAR | Status: DC | PRN

## 2013-05-01 MED ORDER — LACTATED RINGERS IV SOLN
INTRAVENOUS | Status: DC
  Administered 2013-05-01: 1000 mL via INTRAVENOUS

## 2013-05-01 MED ORDER — PROPOFOL INFUSION 10 MG/ML OPTIME
INTRAVENOUS | Status: DC | PRN
  Administered 2013-05-01: 160 ug/kg/min via INTRAVENOUS

## 2013-05-01 NOTE — Transfer of Care (Signed)
Immediate Anesthesia Transfer of Care Note  Patient: [name]  Procedure(s) Performed: Procedure(s) with comments: ESOPHAGOGASTRODUODENOSCOPY (EGD) (N/A) - follow-up varices and possibly band them ESOPHAGEAL BANDING  Patient Location: PACU  Anesthesia Type:MAC  Level of Consciousness: awake, alert  and oriented  Airway & Oxygen Therapy: Patient Spontanous Breathing and Patient connected to nasal cannula oxygen  Post-op Assessment: Report given to PACU RN and Post -op Vital signs reviewed and stable  Post vital signs: Reviewed and stable  Complications: No apparent anesthesia complications

## 2013-05-01 NOTE — Interval H&P Note (Signed)
History and Physical Interval Note:  05/01/2013 12:31 PM  [name]  has presented today for surgery, with the diagnosis of Esoophageal varices  The various methods of treatment have been discussed with the patient and family. After consideration of risks, benefits and other options for treatment, the patient has consented to  Procedure(s) with comments: ESOPHAGOGASTRODUODENOSCOPY (EGD) (N/A) - follow-up varices and possibly band them as a surgical intervention .  The patient's history has been reviewed, patient examined, no change in status, stable for surgery.  I have reviewed the patient's chart and labs.  Questions were answered to the patient's satisfaction.     Stan Head

## 2013-05-01 NOTE — Anesthesia Preprocedure Evaluation (Addendum)
Anesthesia Evaluation  Patient identified by MRN, date of birth, ID band Patient awake    Reviewed: Allergy & Precautions, H&P , NPO status , Patient's Chart, lab work & pertinent test results  Airway Mallampati: II TM Distance: >3 FB Neck ROM: Full    Dental  (+) Dental Advisory Given and Teeth Intact   Pulmonary Current Smoker,  breath sounds clear to auscultation        Cardiovascular hypertension, Pt. on medications and Pt. on home beta blockers negative cardio ROS  Rhythm:Regular Rate:Normal     Neuro/Psych PSYCHIATRIC DISORDERS Anxiety Depression Idiopathic autonomic neuropathy  Neuromuscular disease    GI/Hepatic PUD, GERD-  Medicated,(+) Cirrhosis -  Esophageal Varices and ascites  substance abuse  alcohol use, Hx of Hepatic Encephalopathy in past Esophageal varices   Endo/Other  Morbid obesity  Renal/GU negative Renal ROS     Musculoskeletal negative musculoskeletal ROS (+)   Abdominal (+) + obese,   Peds  Hematology  (+) Blood dyscrasia, anemia , HIV,   Anesthesia Other Findings   Reproductive/Obstetrics                        Anesthesia Physical Anesthesia Plan  ASA: III  Anesthesia Plan: General   Post-op Pain Management:    Induction: Intravenous  Airway Management Planned: Nasal Cannula  Additional Equipment:   Intra-op Plan:   Post-operative Plan:   Informed Consent: I have reviewed the patients History and Physical, chart, labs and discussed the procedure including the risks, benefits and alternatives for the proposed anesthesia with the patient or authorized representative who has indicated his/her understanding and acceptance.   Dental advisory given  Plan Discussed with: CRNA  Anesthesia Plan Comments:         Anesthesia Quick Evaluation

## 2013-05-01 NOTE — Interval H&P Note (Signed)
History and Physical Interval Note:  05/01/2013 12:31 PM  Garrin D Comp  has presented today for surgery, with the diagnosis of Esoophageal varices  The various methods of treatment have been discussed with the patient and family. After consideration of risks, benefits and other options for treatment, the patient has consented to  Procedure(s) with comments: ESOPHAGOGASTRODUODENOSCOPY (EGD) (N/A) - follow-up varices and possibly band them as a surgical intervention .  The patient's history has been reviewed, patient examined, no change in status, stable for surgery.  I have reviewed the patient's chart and labs.  Questions were answered to the patient's satisfaction.     Lavonta Tillis   

## 2013-05-01 NOTE — H&P (View-Only) (Signed)
[facility] History and Physical   Primary Care Physician:  PENNER,PAMELA, MD   Reason for Procedure:   F/U ESOPHAGEAL VARICES  Plan:    EGD     HPI: [name] is a 47 y.o. male WITH CIRRHOSIS AND KNOWN VARICES. HE IS STABLE AND HERE FOR SURVEILLANCE OF ESOPHAGEAL VARICES.   Past Medical History  Diagnosis Date  . HIV DISEASE 06/23/2006  . HYPERLIPIDEMIA 06/23/2006  . ANXIETY 06/23/2006  . DEPRESSION 06/23/2006  . 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 HYPERTENSION 06/23/2006  . 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 Ascites 11/13/2009  . STRAIN, CHEST WALL 03/15/2007  . 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 Gastric ulcer 06/2010  . Alcoholic cirrhosis of liver   . Alcoholism, chronic   . Iron deficiency anemia   . GERD (gastroesophageal reflux disease)   . SBP (spontaneous bacterial peritonitis) 05/03/2011    Suspected by high leukocytes on paracentesis. Clinical scenario also compatible. November 2012 responded to Levaquin. Started on trimethoprim-sulfamethoxazole double strength daily for prophylaxis.   . Obesity (BMI 30-39.9)     BMI 34 kg/m^2  . Left foot infection   . Skin cancer     left calf  . Blood dyscrasia     HIV    Past Surgical History  Procedure Laterality Date  . Esophagogastroduodenoscopy w/ banding  06/26/2010    variceal ligation  . Carpal tunnel release      left  . Esophagogastroduodenoscopy  07/01/2010;  08/12/10    small varices, gastric ulcer  . Esophagogastroduodenoscopy  10/26/2011    Procedure: ESOPHAGOGASTRODUODENOSCOPY (EGD);  Surgeon: Carl E Gessner, MD;  Location: [facility] ENDOSCOPY;  Service: Endoscopy;  Laterality: N/A;  . Upper gastrointestinal endoscopy    . Colonscopy  march 2013  . Esophagogastroduodenoscopy N/A 01/02/2013    Procedure: ESOPHAGOGASTRODUODENOSCOPY (EGD);  Surgeon: Carl E Gessner, MD;  Location: [facility]  ENDOSCOPY;  Service: Endoscopy;  Laterality: N/A;  . Gastric varices banding N/A 01/02/2013    Procedure: GASTRIC VARICES BANDING;  Surgeon: Carl E Gessner, MD;  Location: [facility] ENDOSCOPY;  Service: Endoscopy;  Laterality: N/A;  possible banding    Prior to Admission medications   Medication Sig Start Date End Date Taking? Authorizing Provider  ALPRAZolam (XANAX) 1 MG tablet Take 1 tablet (1 mg total) by mouth 2 (two) times daily. 03/12/13  Yes Cornelius N Van Dam, MD  aMILoride (MIDAMOR) 5 MG tablet TAKE TWO TABLETS BY MOUTH DAILY 01/17/13  Yes Carl E Gessner, MD  B Complex-C (B-COMPLEX WITH VITAMIN C) tablet Take 1 tablet by mouth daily.   Yes Historical Provider, MD  efavirenz-emtricitabine-tenofovir (ATRIPLA) 600-200-300 MG per tablet Take 1 tablet by mouth at bedtime.   Yes Historical Provider, MD  esomeprazole (NEXIUM) 40 MG capsule Take 1 capsule (40 mg total) by mouth daily before breakfast. 01/02/13  Yes Carl E Gessner, MD  furosemide (LASIX) 40 MG tablet TAKE 1 TABLET BY MOUTH TWICE DAILY 01/11/13  Yes Carl E Gessner, MD  lactulose (CHRONULAC) 10 GM/15ML solution Take 30 mLs (20 g total) by mouth 4 (four) times daily. 11/20/12  Yes Carl E Gessner, MD  potassium chloride SA (K-DUR,KLOR-CON) 20 MEQ tablet Take 20 mEq by mouth daily.   Yes Historical Provider, MD  thiamine (VITAMIN B-1) 50 MG tablet Take 50 mg by mouth daily.   Yes Historical Provider, MD  acyclovir (ZOVIRAX) 400 MG tablet Take 400 mg by mouth 2 (two) times   daily as needed (cold sores).  10/29/11   Historical Provider, MD  amitriptyline (ELAVIL) 50 MG tablet Take 1 tablet (50 mg total) by mouth at bedtime as needed for sleep. 02/20/13   Cornelius N Van Dam, MD    Current Facility-Administered Medications  Medication Dose Route Frequency Provider Last Rate Last Dose  . 0.9 %  sodium chloride infusion   Intravenous Continuous Carl E Gessner, MD 20 mL/hr at 04/23/13 0944 500 mL at 04/23/13 0944    Allergies as of 03/26/2013 - Review  Complete 03/01/2013  Allergen Reaction Noted  . Penicillins      Family History  Problem Relation Age of Onset  . Hyperlipidemia Mother   . Hypertension Mother   . Hypertension Father   . Drug abuse Brother   . Breast cancer Maternal Aunt     maternal great aunt  . Colon cancer Neg Hx   . Alcohol abuse Other   . Breast cancer Mother     questionable  . Heart disease Maternal Uncle   . Irritable bowel syndrome Father   . Irritable bowel syndrome Paternal Aunt     History   Social History  . Marital Status: Single    Spouse Name: N/A    Number of Children: N/A  . Years of Education: N/A   Occupational History  . disability    Social History Main Topics  . Smoking status: Current Every Day Smoker -- 0.10 packs/day for 10 years    Types: Cigarettes  . Smokeless tobacco: Never Used     Comment: form given 05-19-11  . Alcohol Use: No     Comment: pt is an alcoholic currently in remission-AA meetings  . Drug Use: No  . Sexual Activity: Yes    Birth Control/ Protection: Condom     Comment: pt. declined condoms   Other Topics Concern  . Not on file   Social History Narrative  . No narrative on file    Review of Systems: All other review of systems negative except as mentioned in the HPI.  Physical Exam: Vital signs in last 24 hours: Temp:  [98.2 F (36.8 C)] 98.2 F (36.8 C) (11/10 0938) Resp:  [17] 17 (11/10 0938) BP: (129)/(79) 129/79 mmHg (11/10 0938) SpO2:  [98 %] 98 % (11/10 0938) Weight:  [235 lb (106.595 kg)] 235 lb (106.595 kg) (11/10 0938)   General:   Alert,  Well-developed, well-nourished, pleasant and cooperative in NAD Lungs:  Clear throughout to auscultation.   Heart:  Regular rate and rhythm; no murmurs, clicks, rubs,  or gallops. Abdomen:  Soft, nontender and nondistended. Normal bowel sounds.   Neuro/Psych:  Alert and cooperative. Normal mood and affect. A and O x 3   @Carl E. Gessner, MD, FACG [facility] [phone]  (pager) 04/23/2013 9:55 AM@   

## 2013-05-01 NOTE — Op Note (Signed)
[facility] [address] [place], [address]   ENDOSCOPY PROCEDURE REPORT  PATIENT: [name], [name]  MR#: [id number] BIRTHDATE: 12-29-1965 , 47  yrs. old GENDER: Male ENDOSCOPIST: Iva Boop, MD, [facility] PROCEDURE DATE:  05/01/2013 PROCEDURE:  EGD w/ band ligation of varices ASA CLASS:     Class III INDICATIONS:  Surveillance.   Therapeutic procedure.  -esophageal varices MEDICATIONS: See Anesthesia Report. TOPICAL ANESTHETIC: none  DESCRIPTION OF PROCEDURE: After the risks benefits and alternatives of the procedure were thoroughly explained, informed consent was obtained.  The Pentax Gastroscope [id number] endoscope was introduced through the mouth and advanced to the second portion of the duodenum. Without limitations.  The instrument was slowly withdrawn as the mucosa was fully examined.     ESOPHAGUS: There were 3 columns of 1-2 +varices in the distal third of the esophagus.  The varices were not actively bleeding.  Band ligation x 3 was performed.  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  The remainder of the upper endoscopy exam was otherwise normal. Retroflexed views revealed no abnormalities.     The scope was then withdrawn from the patient and the procedure completed.  COMPLICATIONS: There were no complications. ENDOSCOPIC IMPRESSION: 1.   There were 3 columns of 1-2 +varices in the distal third of the esophagus; The varices were; band ligation x 3 was performed to prevent future bleeding in a patient with history of bleeding esophageal varices. 2.   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

## 2013-05-02 ENCOUNTER — Encounter (HOSPITAL_COMMUNITY): Payer: Self-pay | Admitting: Internal Medicine

## 2013-05-02 NOTE — Anesthesia Postprocedure Evaluation (Signed)
Anesthesia Post Note  Patient: [name]  Procedure(s) Performed: Procedure(s) (LRB): ESOPHAGOGASTRODUODENOSCOPY (EGD) (N/A) ESOPHAGEAL BANDING  Anesthesia type: MAC  Patient location: PACU  Post pain: Pain level controlled  Post assessment: Post-op Vital signs reviewed  Last Vitals:  Filed Vitals:   05/01/13 1334  BP: 119/69  Pulse:   Temp:   Resp: 19    Post vital signs: Reviewed  Level of consciousness: sedated  Complications: No apparent anesthesia complications

## 2013-05-03 ENCOUNTER — Other Ambulatory Visit (INDEPENDENT_AMBULATORY_CARE_PROVIDER_SITE_OTHER): Payer: Medicaid Other

## 2013-05-03 DIAGNOSIS — B20 Human immunodeficiency virus [HIV] disease: Secondary | ICD-10-CM

## 2013-05-03 LAB — COMPLETE METABOLIC PANEL WITH GFR
Albumin: 3.5 g/dL (ref 3.5–5.2)
Alkaline Phosphatase: 105 U/L (ref 39–117)
CO2: 22 mEq/L (ref 19–32)
Calcium: 8.8 mg/dL (ref 8.4–10.5)
GFR, Est Non African American: >89 mL/min
Glucose, Bld: 99 mg/dL (ref 70–99)
Potassium: 3.7 mEq/L (ref 3.5–5.3)
Sodium: 139 mEq/L (ref 135–145)
Total Protein: 5.9 g/dL — ABNORMAL LOW (ref 6.0–8.3)

## 2013-05-03 LAB — CBC WITH DIFFERENTIAL/PLATELET
Basophils Absolute: 0 10*3/uL (ref 0.0–0.1)
Basophils Relative: 1 % (ref 0–1)
Eosinophils Relative: 3 % (ref 0–5)
HCT: 40.9 % (ref 39.0–52.0)
Lymphocytes Relative: 36 % (ref 12–46)
MCHC: 35 g/dL (ref 30.0–36.0)
MCV: 86.1 fL (ref 78.0–100.0)
Monocytes Absolute: 0.3 10*3/uL (ref 0.1–1.0)
Neutro Abs: 1.3 10*3/uL — ABNORMAL LOW (ref 1.7–7.7)
Neutrophils Relative %: 47 % (ref 43–77)
Platelets: 71 10*3/uL — ABNORMAL LOW (ref 150–400)
RDW: 15.8 % — ABNORMAL HIGH (ref 11.5–15.5)

## 2013-05-04 LAB — T-HELPER CELL (CD4) - (RCID CLINIC ONLY)
CD4 % Helper T Cell: 23 % — ABNORMAL LOW (ref 33–55)
CD4 T Cell Abs: 230 /uL — ABNORMAL LOW (ref 400–2700)

## 2013-05-04 LAB — HIV-1 RNA QUANT-NO REFLEX-BLD
HIV 1 RNA Quant: <20 copies/mL (ref <20)
HIV-1 RNA Quant, Log: <1.3 {Log} (ref <1.30)

## 2013-05-14 ENCOUNTER — Encounter: Payer: Self-pay | Admitting: Infectious Disease

## 2013-05-14 ENCOUNTER — Ambulatory Visit (INDEPENDENT_AMBULATORY_CARE_PROVIDER_SITE_OTHER): Payer: Medicaid Other | Admitting: Infectious Disease

## 2013-05-14 VITALS — BP 138/87 | HR 78 | Temp 97.4°F | Wt 247.0 lb

## 2013-05-14 DIAGNOSIS — F1020 Alcohol dependence, uncomplicated: Secondary | ICD-10-CM

## 2013-05-14 DIAGNOSIS — B20 Human immunodeficiency virus [HIV] disease: Secondary | ICD-10-CM

## 2013-05-14 DIAGNOSIS — N62 Hypertrophy of breast: Secondary | ICD-10-CM

## 2013-05-14 DIAGNOSIS — F411 Generalized anxiety disorder: Secondary | ICD-10-CM

## 2013-05-14 DIAGNOSIS — I8500 Esophageal varices without bleeding: Secondary | ICD-10-CM

## 2013-05-14 DIAGNOSIS — E881 Lipodystrophy, not elsewhere classified: Secondary | ICD-10-CM

## 2013-05-14 LAB — HLA B*5701

## 2013-05-14 MED ORDER — ELVITEG-COBIC-EMTRICIT-TENOFDF 150-150-200-300 MG PO TABS: 1.0000 | ORAL_TABLET | Freq: Every day | ORAL | Status: DC

## 2013-05-14 NOTE — Progress Notes (Signed)
Subjective:    Patient ID: [name], male    DOB: Apr 08, 1966, 47 y.o.   MRN: [id number]  HPI   [name] is a 47 y.o. male with HIV infection who is doing superbly well on their  antiviral regimen, Atripla with undetectable viral load and Cd4 count above 200. He has begun taking Atripla during the day to take it better on empty stomach. I suggested potential changing him to [facility] or TRIUMEQ provided he had no sig R. He had initially had therapy via clinical trial at [facility] with no virological failure to his knowledge.   He is followed closely by Dr. 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  His chronic foot wound has improved radically.  With regards to his eye cause and remains in remission is taking Xanax for anxiety     Review of Systems  Constitutional: Negative for fever, chills, diaphoresis, activity change, appetite change, fatigue and unexpected weight change.  HENT: Negative for congestion, rhinorrhea, sinus pressure, sneezing, sore throat and trouble swallowing.   Eyes: Negative for photophobia and visual disturbance.  Respiratory: Negative for cough, chest tightness, shortness of breath, wheezing and stridor.   Cardiovascular: Negative for chest pain, palpitations and leg swelling.  Gastrointestinal: Negative for nausea, vomiting, abdominal pain, diarrhea, constipation, blood in stool, abdominal distention and anal bleeding.  Genitourinary: Negative for dysuria, hematuria, flank pain and difficulty urinating.  Musculoskeletal: Negative for arthralgias, back pain, gait problem, joint swelling and myalgias.  Skin: Positive for wound. Negative for color change, pallor and rash.  Neurological: Negative for  dizziness, tremors, weakness and light-headedness.  Hematological: Negative for adenopathy. Does not bruise/bleed easily.  Psychiatric/Behavioral: Positive for sleep disturbance. Negative for behavioral problems, confusion, dysphoric mood, decreased concentration and agitation. The patient is nervous/anxious.        Objective:   Physical Exam  Constitutional: He is oriented to person, place, and time. He appears well-developed and well-nourished. No distress.  HENT:  Head: Normocephalic and atraumatic.  Mouth/Throat: Oropharynx is clear and moist. No oropharyngeal exudate.  Eyes: Conjunctivae and EOM are normal. Pupils are equal, round, and reactive to light. No scleral icterus.  Neck: Normal range of motion. Neck supple. No JVD present.  Cardiovascular: Normal rate, regular rhythm and normal heart sounds.  Exam reveals no gallop and no friction rub.   No murmur heard. Pulmonary/Chest: Effort normal and breath sounds normal. No respiratory distress. He has no wheezes. He has no rales. He exhibits no tenderness.  Abdominal: Soft. He exhibits distension. He exhibits no mass. There is no tenderness. There is no rebound and no guarding.  Musculoskeletal: He exhibits no edema and no tenderness.       Feet:  Lymphadenopathy:    He has no cervical adenopathy.  Neurological: He is alert and oriented to person, place, and time. He exhibits normal muscle tone. Coordination normal.  Skin: Skin is warm and dry. He is not diaphoretic. No erythema. No pallor.  Psychiatric: He has a normal mood and affect. His behavior is normal. Judgment and thought content normal.  Memory times seems a bit impaired he is also anxious. He continued to talk to me after having finished his visit came in to the pocket where  I was writing notes to talk to me further about his alcoholism problems, or clarification about his history          Assessment & Plan:   HIV:  Given his problems with gynecomastia possibly due  to Sustiva and given his problems that aren't exists with his alcoholic liver disease at risk for encephalopathy I think it would be prudent to take him off of Atripla and instead change to STRIBILD. He also considered TRIUMEQ but he remains a smoker and has a brother who had early heart disease I would be reluctant to start an abacavir-based regimen with him.   I spent greater than 45 minutes with the patient including greater than 50% of time in face to face counsel of the patient and in coordination of their care.   Etoh induced cirrhosis with varices: continue to follow with Dr. Leone Payor, sober still  Anxiety depression: continue current meds but would consider change to longer acting benzo possibe certainly at present exercise caution with the Xanax as the levels will increase with him taking STRIBILD  Insomnia: See Above  Foot wound: Appears to have healed completely  Gynecomastia: Hopefully this may improve off of Sustiva.  Lipodystrophy changes and morbid obesity: Commended him on his vigorous exercise routine which he should continue with reducing carbohydrates in his diet. He would like to have plastic surgery we'll try to refer her to someone at [facility].

## 2013-05-16 ENCOUNTER — Telehealth: Payer: Self-pay | Admitting: *Deleted

## 2013-05-16 DIAGNOSIS — G4700 Insomnia, unspecified: Secondary | ICD-10-CM

## 2013-05-16 MED ORDER — AMITRIPTYLINE HCL 50 MG PO TABS: 50.0000 mg | ORAL_TABLET | Freq: Every evening | ORAL | Status: DC | PRN

## 2013-05-16 NOTE — Telephone Encounter (Signed)
Patient called wanting to know if there are more side effects with the Stribild than Atripla, I told him no. We receive very few complaints of side effects with the Stribild. He has not started it yet and just wanted to know. He plans to start it this AM. Wendall Mola

## 2013-05-17 ENCOUNTER — Encounter (HOSPITAL_BASED_OUTPATIENT_CLINIC_OR_DEPARTMENT_OTHER): Payer: Medicaid Other

## 2013-05-21 ENCOUNTER — Telehealth: Payer: Self-pay | Admitting: *Deleted

## 2013-05-21 NOTE — Telephone Encounter (Signed)
Patient called c/o leg cramps over the weekend, said he took his potassium and his brother did some leg massages and it improved. He said that since taking the Stribild he feels somewhat "clumsier". He has only been on it for a few days he is going to give it some time and see if he gets use to it. He is anxious about being on a new medication. [name]

## 2013-05-23 NOTE — Telephone Encounter (Signed)
OK to restart?  MD please advise.

## 2013-05-25 ENCOUNTER — Other Ambulatory Visit: Payer: Self-pay | Admitting: Internal Medicine

## 2013-05-25 NOTE — Telephone Encounter (Signed)
Ok to refill x 4 months total (x 1 w/ 3 refills)

## 2013-05-25 NOTE — Telephone Encounter (Signed)
Please advise [name]? 

## 2013-05-28 ENCOUNTER — Encounter (HOSPITAL_COMMUNITY): Payer: Self-pay | Admitting: General Practice

## 2013-05-28 ENCOUNTER — Encounter: Payer: Self-pay | Admitting: Infectious Disease

## 2013-05-28 ENCOUNTER — Telehealth: Payer: Self-pay | Admitting: *Deleted

## 2013-05-28 ENCOUNTER — Ambulatory Visit (INDEPENDENT_AMBULATORY_CARE_PROVIDER_SITE_OTHER): Payer: Medicaid Other | Admitting: Infectious Disease

## 2013-05-28 ENCOUNTER — Inpatient Hospital Stay (HOSPITAL_COMMUNITY)
Admission: AD | Admit: 2013-05-28 | Discharge: 2013-05-30 | DRG: 441 | Disposition: A | Discharge status: Home / Self Care | Payer: Medicaid Other | Source: Ambulatory Visit | Attending: Internal Medicine | Admitting: Internal Medicine

## 2013-05-28 VITALS — BP 106/71 | HR 82 | Wt 242.0 lb

## 2013-05-28 DIAGNOSIS — F411 Generalized anxiety disorder: Secondary | ICD-10-CM

## 2013-05-28 DIAGNOSIS — F3289 Other specified depressive episodes: Secondary | ICD-10-CM

## 2013-05-28 DIAGNOSIS — D509 Iron deficiency anemia, unspecified: Secondary | ICD-10-CM

## 2013-05-28 DIAGNOSIS — K7290 Hepatic failure, unspecified without coma: Principal | ICD-10-CM

## 2013-05-28 DIAGNOSIS — I951 Orthostatic hypotension: Secondary | ICD-10-CM

## 2013-05-28 DIAGNOSIS — E785 Hyperlipidemia, unspecified: Secondary | ICD-10-CM

## 2013-05-28 DIAGNOSIS — K7030 Alcoholic cirrhosis of liver without ascites: Secondary | ICD-10-CM

## 2013-05-28 DIAGNOSIS — Z9283 Personal history of failed moderate sedation: Secondary | ICD-10-CM

## 2013-05-28 DIAGNOSIS — K219 Gastro-esophageal reflux disease without esophagitis: Secondary | ICD-10-CM | POA: Diagnosis present

## 2013-05-28 DIAGNOSIS — J320 Chronic maxillary sinusitis: Secondary | ICD-10-CM

## 2013-05-28 DIAGNOSIS — K766 Portal hypertension: Secondary | ICD-10-CM | POA: Diagnosis present

## 2013-05-28 DIAGNOSIS — N62 Hypertrophy of breast: Secondary | ICD-10-CM

## 2013-05-28 DIAGNOSIS — B20 Human immunodeficiency virus [HIV] disease: Secondary | ICD-10-CM

## 2013-05-28 DIAGNOSIS — I10 Essential (primary) hypertension: Secondary | ICD-10-CM

## 2013-05-28 DIAGNOSIS — G4700 Insomnia, unspecified: Secondary | ICD-10-CM

## 2013-05-28 DIAGNOSIS — F1020 Alcohol dependence, uncomplicated: Secondary | ICD-10-CM | POA: Diagnosis present

## 2013-05-28 DIAGNOSIS — K2100 Gastro-esophageal reflux disease with esophagitis, without bleeding: Secondary | ICD-10-CM

## 2013-05-28 DIAGNOSIS — K769 Liver disease, unspecified: Secondary | ICD-10-CM | POA: Diagnosis present

## 2013-05-28 DIAGNOSIS — I8500 Esophageal varices without bleeding: Secondary | ICD-10-CM

## 2013-05-28 DIAGNOSIS — T43591A Poisoning by other antipsychotics and neuroleptics, accidental (unintentional), initial encounter: Secondary | ICD-10-CM

## 2013-05-28 DIAGNOSIS — D696 Thrombocytopenia, unspecified: Secondary | ICD-10-CM | POA: Diagnosis present

## 2013-05-28 DIAGNOSIS — T424X4A Poisoning by benzodiazepines, undetermined, initial encounter: Secondary | ICD-10-CM

## 2013-05-28 DIAGNOSIS — E860 Dehydration: Secondary | ICD-10-CM | POA: Diagnosis present

## 2013-05-28 DIAGNOSIS — K7682 Hepatic encephalopathy: Principal | ICD-10-CM

## 2013-05-28 DIAGNOSIS — G9340 Encephalopathy, unspecified: Secondary | ICD-10-CM

## 2013-05-28 DIAGNOSIS — F17200 Nicotine dependence, unspecified, uncomplicated: Secondary | ICD-10-CM | POA: Diagnosis present

## 2013-05-28 DIAGNOSIS — K3189 Other diseases of stomach and duodenum: Secondary | ICD-10-CM

## 2013-05-28 DIAGNOSIS — G9009 Other idiopathic peripheral autonomic neuropathy: Secondary | ICD-10-CM

## 2013-05-28 DIAGNOSIS — Z85828 Personal history of other malignant neoplasm of skin: Secondary | ICD-10-CM | POA: Diagnosis present

## 2013-05-28 DIAGNOSIS — D61818 Other pancytopenia: Secondary | ICD-10-CM

## 2013-05-28 DIAGNOSIS — Z79899 Other long term (current) drug therapy: Secondary | ICD-10-CM

## 2013-05-28 DIAGNOSIS — E669 Obesity, unspecified: Secondary | ICD-10-CM | POA: Diagnosis present

## 2013-05-28 DIAGNOSIS — F329 Major depressive disorder, single episode, unspecified: Secondary | ICD-10-CM | POA: Diagnosis present

## 2013-05-28 DIAGNOSIS — N529 Male erectile dysfunction, unspecified: Secondary | ICD-10-CM

## 2013-05-28 HISTORY — DX: Orthostatic hypotension: I95.1

## 2013-05-28 LAB — COMPREHENSIVE METABOLIC PANEL
AST: 58 U/L — ABNORMAL HIGH (ref 0–37)
Albumin: 3.6 g/dL (ref 3.5–5.2)
Calcium: 9.2 mg/dL (ref 8.4–10.5)
Chloride: 106 mEq/L (ref 96–112)
Creatinine, Ser: 0.86 mg/dL (ref 0.50–1.35)
GFR calc non Af Amer: >90 mL/min (ref >90)
Sodium: 142 mEq/L (ref 135–145)

## 2013-05-28 LAB — PHOSPHORUS: Phosphorus: 3.5 mg/dL (ref 2.3–4.6)

## 2013-05-28 LAB — CBC WITH DIFFERENTIAL/PLATELET
Basophils Absolute: 0 10*3/uL (ref 0.0–0.1)
Basophils Relative: 0 % (ref 0–1)
Eosinophils Absolute: 0.1 10*3/uL (ref 0.0–0.7)
Eosinophils Relative: 2 % (ref 0–5)
MCH: 30.5 pg (ref 26.0–34.0)
MCHC: 34.7 g/dL (ref 30.0–36.0)
Monocytes Relative: 10 % (ref 3–12)
Neutro Abs: 2.1 10*3/uL (ref 1.7–7.7)
Neutrophils Relative %: 54 % (ref 43–77)
Platelets: 54 10*3/uL — ABNORMAL LOW (ref 150–400)
RDW: 16.7 % — ABNORMAL HIGH (ref 11.5–15.5)

## 2013-05-28 LAB — MAGNESIUM: Magnesium: 1.9 mg/dL (ref 1.5–2.5)

## 2013-05-28 LAB — AMMONIA: Ammonia: 82 umol/L — ABNORMAL HIGH (ref 11–60)

## 2013-05-28 MED ORDER — PANTOPRAZOLE SODIUM 40 MG PO TBEC
40.0000 mg | DELAYED_RELEASE_TABLET | Freq: Every day | ORAL | Status: DC
  Administered 2013-05-29 – 2013-05-30 (×2): 40 mg via ORAL
  Filled 2013-05-28 (×2): qty 1

## 2013-05-28 MED ORDER — DOLUTEGRAVIR SODIUM 50 MG PO TABS
50.0000 mg | ORAL_TABLET | Freq: Every day | ORAL | Status: DC
  Administered 2013-05-29 – 2013-05-30 (×2): 50 mg via ORAL
  Filled 2013-05-28 (×3): qty 1

## 2013-05-28 MED ORDER — SODIUM CHLORIDE 0.9 % IJ SOLN
3.0000 mL | Freq: Two times a day (BID) | INTRAMUSCULAR | Status: DC
  Administered 2013-05-28: 3 mL via INTRAVENOUS

## 2013-05-28 MED ORDER — EMTRICITABINE-TENOFOVIR DF 200-300 MG PO TABS
1.0000 | ORAL_TABLET | Freq: Every day | ORAL | Status: DC
  Administered 2013-05-29 – 2013-05-30 (×2): 1 via ORAL
  Filled 2013-05-28 (×3): qty 1

## 2013-05-28 MED ORDER — SODIUM CHLORIDE 0.9 % IV SOLN
INTRAVENOUS | Status: DC
  Administered 2013-05-28 – 2013-05-29 (×2): via INTRAVENOUS

## 2013-05-28 MED ORDER — ONDANSETRON HCL 4 MG PO TABS: 4.0000 mg | ORAL_TABLET | Freq: Four times a day (QID) | ORAL | Status: DC | PRN

## 2013-05-28 MED ORDER — FLEET ENEMA 7-19 GM/118ML RE ENEM
1.0000 | ENEMA | Freq: Once | RECTAL | Status: AC
  Administered 2013-05-28: 1 via RECTAL
  Filled 2013-05-28: qty 1

## 2013-05-28 MED ORDER — VITAMIN B-1 50 MG PO TABS
50.0000 mg | ORAL_TABLET | Freq: Every day | ORAL | Status: DC
  Administered 2013-05-29 – 2013-05-30 (×2): 50 mg via ORAL
  Filled 2013-05-28 (×3): qty 1

## 2013-05-28 MED ORDER — ONDANSETRON HCL 4 MG/2ML IJ SOLN: 4.0000 mg | Freq: Four times a day (QID) | INTRAMUSCULAR | Status: DC | PRN

## 2013-05-28 MED ORDER — ALPRAZOLAM 0.5 MG PO TABS
0.5000 mg | ORAL_TABLET | Freq: Two times a day (BID) | ORAL | Status: DC
  Administered 2013-05-28 – 2013-05-30 (×4): 0.5 mg via ORAL
  Filled 2013-05-28 (×5): qty 1

## 2013-05-28 MED ORDER — AMITRIPTYLINE HCL 50 MG PO TABS
50.0000 mg | ORAL_TABLET | Freq: Every day | ORAL | Status: DC
  Administered 2013-05-28 – 2013-05-29 (×2): 50 mg via ORAL
  Filled 2013-05-28 (×3): qty 1

## 2013-05-28 MED ORDER — LACTULOSE 10 GM/15ML PO SOLN
20.0000 g | Freq: Four times a day (QID) | ORAL | Status: DC
  Administered 2013-05-28 – 2013-05-29 (×4): 20 g via ORAL
  Filled 2013-05-28 (×7): qty 30

## 2013-05-28 MED ORDER — DOLUTEGRAVIR SODIUM 50 MG PO TABS: 50.0000 mg | ORAL_TABLET | Freq: Every day | ORAL | Status: DC

## 2013-05-28 MED ORDER — EMTRICITABINE-TENOFOVIR DF 200-300 MG PO TABS: 1.0000 | ORAL_TABLET | Freq: Every day | ORAL | Status: DC

## 2013-05-28 NOTE — Telephone Encounter (Signed)
Requesting assessment by Dr. Daiva Eves.  Dr. Daiva Eves agreed to see patient this morning on a "work-in" basis.

## 2013-05-28 NOTE — H&P (Signed)
[facility] History and Physical  [name] ZOX:[id number] DOB: 29-Dec-1965 DOA: 05/28/2013  Referring physician: Dr Daiva Eves PCP: Alinda Deem, MD   Chief Complaint: sent from doctors office for slurred speech, lethargy and orthostatic hypotension.   HPI: [name] is a 47 y.o. male with prior h/o HIV, alcoholic cirrhosis of the liver, hypertension, hepatic encephalopathy, came to Dr Daiva Eves office for follow up. As per the family at bedside and the patient, he is more lethargic, slow in speech, some slurring , and gait instability. He is oriented to place , person and time, but repeats his questions. He reports the bowel movements have decreased in frequency, though reports taking lactulose as recommended. In the office he was also found to be orthostatic. He denies any chest pain, sob, fever, chills, cough, or burning micturition. He reports being dehydrated.    Review of Systems:  Constitutional:  No weight loss, night sweats, Fevers, chills, fatigue.  HEENT:  No headaches, Difficulty swallowing,Tooth/dental problems,Sore throat,  No sneezing, itching, ear ache, nasal congestion, post nasal drip,  Cardio-vascular:  No chest pain, Orthopnea, PND, swelling in lower extremities, anasarca, dizziness, palpitations  GI:  No heartburn, indigestion, abdominal pain, nausea, vomiting, diarrhea, change in bowel habits, loss of appetite  Resp:  No shortness of breath with exertion or at rest. No excess mucus, no productive cough, No non-productive cough, No coughing up of blood.No change in color of mucus.No wheezing.No chest wall deformity  Skin:  no rash or lesions.  GU:  no dysuria, change in color of urine, no urgency or frequency. No flank pain.  Musculoskeletal:  Muscle cramps, leg cramps, myalgias.  Neuro: Slurred speech since 2 weeks, lethargy. Gait abnormal, dizziness, no syncope.   Past Medical History  Diagnosis Date  . HIV DISEASE 06/23/2006  . HYPERLIPIDEMIA  06/23/2006  . ANXIETY 06/23/2006  . DEPRESSION 06/23/2006  . 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 HYPERTENSION 06/23/2006  . 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 Ascites 11/13/2009  . STRAIN, CHEST WALL 03/15/2007  . 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 Gastric ulcer 06/2010  . Alcoholic cirrhosis of liver   . Alcoholism, chronic   . Iron deficiency anemia   . GERD (gastroesophageal reflux disease)   . SBP (spontaneous bacterial peritonitis) 05/03/2011    Suspected by high leukocytes on paracentesis. Clinical scenario also compatible. November 2012 responded to Levaquin. Started on trimethoprim-sulfamethoxazole double strength daily for prophylaxis.   . Obesity (BMI 30-39.9)     BMI 34 kg/m^2  . Left foot infection   . Skin cancer     left calf  . Blood dyscrasia     HIV  . Ascites   . Portal hypertensive gastropathy 01/02/2013   Past Surgical History  Procedure Laterality Date  . Esophagogastroduodenoscopy w/ banding  06/26/2010    variceal ligation  . Carpal tunnel release      left  . Esophagogastroduodenoscopy  07/01/2010;  08/12/10    small varices, gastric ulcer  . Esophagogastroduodenoscopy  10/26/2011    Procedure: ESOPHAGOGASTRODUODENOSCOPY (EGD);  Surgeon: Iva Boop, MD;  Location: [facility];  Service: Endoscopy;  Laterality: N/A;  . Upper gastrointestinal endoscopy    . Colonscopy  march 2013  . Esophagogastroduodenoscopy N/A 01/02/2013    Procedure: ESOPHAGOGASTRODUODENOSCOPY (EGD);  Surgeon: Iva Boop, MD;  Location: [facility];  Service: Endoscopy;  Laterality: N/A;  . Gastric varices banding N/A 01/02/2013  Procedure: GASTRIC VARICES BANDING;  Surgeon: Iva Boop, MD;  Location: [facility];  Service: Endoscopy;  Laterality: N/A;  possible banding  . Esophagogastroduodenoscopy N/A 04/23/2013    Procedure: ESOPHAGOGASTRODUODENOSCOPY (EGD);  Surgeon:  Iva Boop, MD;  Location: [facility];  Service: Endoscopy;  Laterality: N/A;  . Esophagogastroduodenoscopy N/A 05/01/2013    Procedure: ESOPHAGOGASTRODUODENOSCOPY (EGD);  Surgeon: Iva Boop, MD;  Location: [facility];  Service: Endoscopy;  Laterality: N/A;  follow-up varices and possibly band them  . Esophageal banding  05/01/2013    Procedure: ESOPHAGEAL BANDING;  Surgeon: Iva Boop, MD;  Location: [facility];  Service: Endoscopy;;   Social History:  reports that he has been smoking Cigarettes.  He has a 1 pack-year smoking history. He has never used smokeless tobacco. He reports that he does not drink alcohol or use illicit drugs.  Allergies  Allergen Reactions  . Penicillins     REACTION: hives    Family History  Problem Relation Age of Onset  . Hyperlipidemia Mother   . Hypertension Mother   . Breast cancer Mother     questionable  . Heart disease Mother   . Hypertension Father   . Irritable bowel syndrome Father   . Drug abuse Brother   . Heart disease Brother   . Breast cancer Maternal Aunt     maternal great aunt  . Colon cancer Neg Hx   . Alcohol abuse Other   . Heart disease Maternal Uncle   . Irritable bowel syndrome Paternal Aunt      Prior to Admission medications   Medication Sig Start Date End Date Taking? Authorizing Provider  acyclovir (ZOVIRAX) 400 MG tablet Take 400 mg by mouth 2 (two) times daily as needed (cold sores).  10/29/11  Yes Historical Provider, MD  ALPRAZolam Prudy Feeler) 1 MG tablet Take 1 tablet (1 mg total) by mouth 2 (two) times daily. 03/12/13  Yes Randall Hiss, MD  [facility]) 5 MG tablet Take 10 mg by mouth every morning.    Yes Historical Provider, MD  amitriptyline (ELAVIL) 50 MG tablet Take 50 mg by mouth at bedtime.   Yes Historical Provider, MD  B Complex-C (B-COMPLEX WITH VITAMIN C) tablet Take 1 tablet by mouth daily.   Yes Historical Provider, MD  dolutegravir (TIVICAY) 50 MG tablet Take 1 tablet (50 mg  total) by mouth daily. 05/28/13  Yes Randall Hiss, MD  emtricitabine-tenofovir (TRUVADA) 200-300 MG per tablet Take 1 tablet by mouth daily. 05/28/13  Yes Randall Hiss, MD  esomeprazole (NEXIUM) 40 MG capsule Take 1 capsule (40 mg total) by mouth daily before breakfast. 01/02/13  Yes Iva Boop, MD  furosemide (LASIX) 40 MG tablet Take 40 mg by mouth 2 (two) times daily.   Yes Historical Provider, MD  lactulose (CHRONULAC) 10 GM/15ML solution Take 30 mLs (20 g total) by mouth 4 (four) times daily. 11/20/12  Yes Iva Boop, MD  potassium chloride SA (K-DUR,KLOR-CON) 20 MEQ tablet TAKE ONE TABLET BY MOUTH ONE TIME DAILY 05/25/13  Yes Iva Boop, MD  thiamine (VITAMIN B-1) 50 MG tablet Take 50 mg by mouth daily.   Yes Historical Provider, MD   Physical Exam: Filed Vitals:   05/28/13 1338  BP: 121/75  Pulse: 65  Temp: 98.3 F (36.8 C)  Resp: 17    BP 121/75  Pulse 65  Temp(Src) 98.3 F (36.8 C)  Resp 17  Ht 6' (1.829 m)  [employer]  108 kg (238 lb 1.6 oz)  BMI 32.28 kg/m2  SpO2 100%           Constitutional: Vital signs reviewed.  Patient is a well-developed and well-nourished  in no acute distress and cooperative with exam. Alert and oriented x3.  Head: Normocephalic and atraumatic Mouth: no erythema or exudates, dryMM Eyes: PERRL, EOMI, conjunctivae normal, No scleral icterus.  Neck: Supple, Trachea midline normal ROM, No JVD, mass, thyromegaly, or carotid bruit present.  Cardiovascular: RRR, S1 normal, S2 normal, no MRG, pulses symmetric and intact bilaterally Pulmonary/Chest: normal respiratory effort, CTAB, no wheezes, rales, or rhonchi Abdominal: Soft. Non-tender, non-distended, bowel sounds are normal, no masses, organomegaly, or guarding present.  Musculoskeletal: No joint deformities, erythema, or stiffness, ROM full and no nontender Neurological: A&O x3, Strength is normal and symmetric bilaterally, cranial nerve II-XII are grossly intact, no focal motor  deficit, gait instability, no facial asymmetry, slurred speech.  Skin: Warm, dry and intact. No rash, cyanosis, or clubbing.  Psychiatric: normal Labs on Admission:  Basic Metabolic Panel: No results found for this basename: NA, K, CL, CO2, GLUCOSE, BUN, CREATININE, CALCIUM, MG, PHOS,  in the last 168 hours Liver Function Tests: No results found for this basename: AST, ALT, ALKPHOS, BILITOT, PROT, ALBUMIN,  in the last 168 hours No results found for this basename: LIPASE, AMYLASE,  in the last 168 hours No results found for this basename: AMMONIA,  in the last 168 hours CBC:  Recent Labs Lab 05/28/13 1615  WBC 3.9*  NEUTROABS 2.1  HGB 15.3  HCT 44.1  MCV 88.0  PLT PENDING   Cardiac Enzymes: No results found for this basename: CKTOTAL, CKMB, CKMBINDEX, TROPONINI,  in the last 168 hours  BNP (last 3 results) No results found for this basename: PROBNP,  in the last 8760 hours CBG: No results found for this basename: GLUCAP,  in the last 168 hours  Radiological Exams on Admission: No results found.  EKG:   Assessment/Plan Active Problems:   Orthostatic hypotension  1. Slurred speech/ lethargy/ gait instability/  : possibly secondary to  HIV drug interactions vs acute hepatic encephalopathy : - admitted to telemetry.  - basic labs including. Cbc, cmp, ammonia, magnesium, phosphorus, ck levels.  - resume lactulose at home dose.  - please call GI in am ( dr Leone Payor) for optimization of medications in view of his liver failure   2. Orthostatic Hypotension: - secondary to dehydration. -s tarted him on NS gentle hydration.  - repeat orthostatics on admit and in am.   3. Hypertension: - controlled.   4. HIV disease: - resume medications recommended by Dr Daiva Eves. - please see 12/15 note from office for further information.   5. Anxiety/ depression: - pt is adamant about taking his xanax . He reports that he does not want to miss his night dose. Decreased the dose to  0.5 qhs.   6. DVT prophylaxis: scd's till we get platelet count back.   Code Status: full code Family Communication:discussed the plan of care with pt, family at bedside Disposition Plan: admit to inpatient.  Time spent: 75  min  [facility] Pager [phone]

## 2013-05-28 NOTE — Progress Notes (Signed)
Subjective:    Patient ID: [name], male    DOB: 05/16/1966, 47 y.o.   MRN: [id number]  HPI   [name] is a 47 y.o. male with HIV infection who had been  doing superbly well on their  antiviral regimen, Atripla with undetectable viral load and Cd4 count above 200. He has begun taking Atripla during the day.  When I last saw him he was already showing some evidence of slowing of speech and I was concerned for efavirenz contributing to his confusion and also the gynecomastia which was also adversely effecting his self image and depression.  We changed him to [facility] but Unfortunately since he made the change to [facility] , he has done much worse. I had cautioned him that the STRIBILD would likely elevate his levels of Xanax in the blood in addition the a P4 50 enzymes. At that time though he was complaining that he was not getting enough relief of his anxiety so I did not have excess worry about this.  In any case since starting the STRIBILD he has had increasing progressive sleepiness with slurred speech and one fall approximately a week ago when he is leaning against his table. He has had muscle cramping in his legs and has had an unsteady gait. He's been taking lactulose but is only having 2 bowel movements per day.  He came to clinic today do to severely of his symptoms as attempted by his mother. On exam in the clinic he is very unstable on his feet walking as if he is drunk.  He's has a obvious liver flap on exam. He is oriented to person place and location but has obviously slurred speech.  He also had orthostasis with a drop of 30 points and the systolic blood pressure on standing up.  I am concerned that he has worsening hepatic encephalopathy with likely concomitant issues with xanax levels being high due to COBI inhibitiing the metabolism of this benzodiazepene.       Review of Systems  Constitutional: Positive for activity change, appetite change and fatigue.  Negative for fever, chills, diaphoresis and unexpected weight change.  HENT: Negative for congestion, rhinorrhea, sinus pressure, sneezing, sore throat and trouble swallowing.   Eyes: Negative for photophobia and visual disturbance.  Respiratory: Negative for cough, chest tightness, shortness of breath, wheezing and stridor.   Cardiovascular: Negative for chest pain, palpitations and leg swelling.  Gastrointestinal: Negative for nausea, vomiting, abdominal pain, diarrhea, constipation, blood in stool, abdominal distention and anal bleeding.  Genitourinary: Negative for dysuria, hematuria, flank pain and difficulty urinating.  Musculoskeletal: Positive for arthralgias and myalgias. Negative for back pain, gait problem and joint swelling.  Skin: Negative for color change, pallor, rash and wound.  Neurological: Positive for dizziness and light-headedness. Negative for tremors, seizures and weakness.  Hematological: Negative for adenopathy. Does not bruise/bleed easily.  Psychiatric/Behavioral: Positive for confusion and decreased concentration. Negative for behavioral problems, sleep disturbance, dysphoric mood and agitation. The patient is not nervous/anxious.        Objective:   Physical Exam  Constitutional: He is oriented to person, place, and time. He appears well-developed and well-nourished. No distress.  HENT:  Head: Normocephalic and atraumatic.  Mouth/Throat: Oropharynx is clear and moist. No oropharyngeal exudate.    Eyes: Conjunctivae and EOM are normal. Pupils are equal, round, and reactive to light. No scleral icterus.  Neck: Normal range of motion. Neck supple. No JVD present.  Cardiovascular: Normal rate, regular rhythm and normal  heart sounds.  Exam reveals no gallop and no friction rub.   No murmur heard. Pulmonary/Chest: Effort normal and breath sounds normal. No respiratory distress. He has no wheezes. He has no rales. He exhibits no tenderness.  Abdominal: Soft. He  exhibits distension. He exhibits no mass. There is no tenderness. There is no rebound and no guarding.    Musculoskeletal: He exhibits no edema and no tenderness.       Feet:  Lymphadenopathy:    He has no cervical adenopathy.  Neurological: He is alert and oriented to person, place, and time. No sensory deficit. He exhibits normal muscle tone. Coordination and gait abnormal.  Liver flap, unsteady gait  Skin: Skin is warm and dry. He is not diaphoretic. No erythema. No pallor.  Psychiatric: He has a normal mood and affect. His behavior is normal. Judgment and thought content normal.          Assessment & Plan:   Encephalopathy:   Likely multifactorial. Could have been made worse by higher levels of xanax due to interaction with COBI. His low carbohydrate diet could also have been an issue with higher protein consumption.   [facility] and Dr. Blake Divine has graciously agreed to admit the patient.  He does have orthostatic changes in his blood pressure.  I think he needs to be admitted to telemetry and have stat blood work including a compress metabolic panel alcohol level drug screen, ammonia level CBC with differential.  I suspect she will need volume correction but also will need lactulose given to treat his acute encephalopathy.  I would also be cautious with use of benzodiazepines as he likely has a build up of his Xanax levels of not wanting to precipitate benzodiazepine withdrawal.  With regards to his antiretrovirals I am changing him now to [facility] and Truvada to avoid the drug interactions we were dealing with was STRIBILD.  I spent greater than 45 minutes with the patient including greater than 50% of time in face to face counsel of the patient and in coordination of their care.   HIV: See above discussion we'll change him to 2 pills once a day in the form of TIVICAY and Truvada. I had considered TRIUMEQ but with his smoking and brother with  early CAD I am not eager to place him on an abacavir-based regimen   Etoh induced cirrhosis with varices: See above discussion with regards to his encephalopathy. I think it would be helfpul if GI could see him and optimize meds esp making sure we are not making things worse in other ways beyond what I am taking care of from an HIV prospective  Anxiety depression see above discussion concern about Xanax and benzodiazepines in this patient not sure what the best option for management but I see getting him off of STRIBILD will help to normalize his Xanax levels.  Gynecomastia: Hopefully this may improve off of Sustiva.  Lipodystrophy changes and morbid obesity he is been exercising in the past and was doing a low carbohydrate diet but the more think about this I worry that maybe the low carbohydrate diet but being assaulted to a typically high protein diet may not be the best option for him. He claims to lost weight with it but I'm wondering if this may have contributed acutely to his worsening encephalopathy.

## 2013-05-28 NOTE — Telephone Encounter (Signed)
Just saw pt.

## 2013-05-29 ENCOUNTER — Inpatient Hospital Stay (HOSPITAL_COMMUNITY): Payer: Medicaid Other

## 2013-05-29 DIAGNOSIS — B20 Human immunodeficiency virus [HIV] disease: Secondary | ICD-10-CM

## 2013-05-29 DIAGNOSIS — K7290 Hepatic failure, unspecified without coma: Secondary | ICD-10-CM

## 2013-05-29 DIAGNOSIS — I10 Essential (primary) hypertension: Secondary | ICD-10-CM

## 2013-05-29 DIAGNOSIS — K7682 Hepatic encephalopathy: Secondary | ICD-10-CM

## 2013-05-29 LAB — COMPREHENSIVE METABOLIC PANEL
AST: 46 U/L — ABNORMAL HIGH (ref 0–37)
Albumin: 3.3 g/dL — ABNORMAL LOW (ref 3.5–5.2)
Calcium: 8.8 mg/dL (ref 8.4–10.5)
Chloride: 107 mEq/L (ref 96–112)
Creatinine, Ser: 0.79 mg/dL (ref 0.50–1.35)
Glucose, Bld: 83 mg/dL (ref 70–99)
Potassium: 3.3 mEq/L — ABNORMAL LOW (ref 3.5–5.1)
Total Bilirubin: 1.7 mg/dL — ABNORMAL HIGH (ref 0.3–1.2)
Total Protein: 5.8 g/dL — ABNORMAL LOW (ref 6.0–8.3)

## 2013-05-29 LAB — URINALYSIS, ROUTINE W REFLEX MICROSCOPIC
Bilirubin Urine: NEGATIVE
Glucose, UA: NEGATIVE mg/dL
Hgb urine dipstick: NEGATIVE
Leukocytes, UA: NEGATIVE
Protein, ur: NEGATIVE mg/dL
Specific Gravity, Urine: 1.007 (ref 1.005–1.030)
pH: 7 (ref 5.0–8.0)

## 2013-05-29 LAB — RAPID URINE DRUG SCREEN, HOSP PERFORMED
Amphetamines: NOT DETECTED
Benzodiazepines: NOT DETECTED
Cocaine: NOT DETECTED
Opiates: NOT DETECTED
Tetrahydrocannabinol: NOT DETECTED

## 2013-05-29 LAB — AMMONIA: Ammonia: 134 umol/L — ABNORMAL HIGH (ref 11–60)

## 2013-05-29 LAB — CBC
HCT: 39.5 % (ref 39.0–52.0)
MCH: 29.9 pg (ref 26.0–34.0)
MCHC: 34.2 g/dL (ref 30.0–36.0)
MCV: 87.6 fL (ref 78.0–100.0)
Platelets: 45 10*3/uL — ABNORMAL LOW (ref 150–400)
RBC: 4.51 MIL/uL (ref 4.22–5.81)

## 2013-05-29 MED ORDER — ALPRAZOLAM 0.5 MG PO TABS
0.5000 mg | ORAL_TABLET | Freq: Once | ORAL | Status: AC
  Administered 2013-05-29: 0.5 mg via ORAL

## 2013-05-29 MED ORDER — FLEET ENEMA 7-19 GM/118ML RE ENEM
1.0000 | ENEMA | Freq: Once | RECTAL | Status: AC
  Administered 2013-05-29: 1 via RECTAL
  Filled 2013-05-29: qty 1

## 2013-05-29 MED ORDER — LACTULOSE 10 GM/15ML PO SOLN
30.0000 g | Freq: Once | ORAL | Status: AC
  Administered 2013-05-29: 30 g via ORAL
  Filled 2013-05-29: qty 45

## 2013-05-29 MED ORDER — FOLIC ACID 1 MG PO TABS
1.0000 mg | ORAL_TABLET | Freq: Every day | ORAL | Status: DC
  Administered 2013-05-29 – 2013-05-30 (×2): 1 mg via ORAL
  Filled 2013-05-29 (×2): qty 1

## 2013-05-29 MED ORDER — LACTULOSE 10 GM/15ML PO SOLN
30.0000 g | Freq: Four times a day (QID) | ORAL | Status: DC
  Administered 2013-05-29 – 2013-05-30 (×3): 30 g via ORAL
  Filled 2013-05-29 (×7): qty 45

## 2013-05-29 MED ORDER — POTASSIUM CHLORIDE CRYS ER 20 MEQ PO TBCR
40.0000 meq | EXTENDED_RELEASE_TABLET | Freq: Once | ORAL | Status: AC
  Administered 2013-05-29: 40 meq via ORAL
  Filled 2013-05-29: qty 2

## 2013-05-29 NOTE — Progress Notes (Signed)
Pt has office visit with Dr Leone Payor 07/04/12 at 1130 AM.  This is at request of Dr Daiva Eves who sates " I think it would be helfpul if GI could see him and optimize meds esp making sure we are not making things worse in other ways beyond what I am taking care of from an HIV prospective".  Pt should continue taking lactulose and titrate the dose to have at least 3 BMs daily.  Ideally pt ought to wean off Xanax as this med confuses the picture as concerns hepatic encephalopathy in pt with Ammonia level that is just 82.   Pt is up to date on EGD screening.  Done 05/01/2013 and showed  "3 columns of 1-2 +varices in the distal third of the esophagus; The varices were; band ligation x 3 was performed to prevent future bleeding in a patient with history of bleeding esophageal varices. Portal hypertensive gastropathy was found in the entire examined stomach." Not currently on dose of beta blocker.  Case discussed with Dr Arlyce Dice.    Jennye Moccasin

## 2013-05-29 NOTE — Progress Notes (Addendum)
[facility] PROGRESS NOTE  [name] ZOX:[id number] DOB: [birth year] DOA: 05/28/2013 PCP: Alinda Deem, MD  Assessment/Plan: 1-Slurred speech, lethargic gait instability; could be secondary to hepatic encephalopathy. Due to thrombocytopenia and history of fall will check MRI of brain rule out stroke, bleed.  -Continue with lactulose. Repeat ammonia level.  -Continue with Xanax hold for sedation.  -PT consulted.   2-Orthostatic hypotension; Continue with IV fluids.   3-HIV: didn't tolerates Stribild. Started on Tivicay and Truvada.  ID following.   4-Cirrhosis liver: GI consulted to help and review medications.   Addendum:  Thrombocytopenia: could be secondary to liver failure, check B 12, folate level. Follow trend.   Code Status: Full Code.  Family Communication: Care discussed with patient.  Disposition Plan: Remain inpatient.    Consultants:  ID.   Procedures:  MRI; pending.   Antibiotics:  none  HPI/Subjective: Feeling better today. Speech is not slurred.  No significant balance problems.    Objective: Filed Vitals:   05/29/13 0427  BP: 122/70  Pulse: 73  Temp: 97.8 F (36.6 C)  Resp: 18   No intake or output data in the 24 hours ending 05/29/13 0840 Filed Weights   05/28/13 1338  Weight: 108 kg (238 lb 1.6 oz)    Exam:   General:  No distress, speech clear.   Cardiovascular: S 1, S 2 RRR  Respiratory: CTA  Abdomen: Bs present, soft.   Musculoskeletal: trace edema.   Data Reviewed: Basic Metabolic Panel:  Recent Labs Lab 05/28/13 1615  NA 142  K 4.2  CL 106  CO2 24  GLUCOSE 93  BUN 11  CREATININE 0.86  CALCIUM 9.2  MG 1.9  PHOS 3.5   Liver Function Tests:  Recent Labs Lab 05/28/13 1615  AST 58*  ALT 48  ALKPHOS 142*  BILITOT 1.2  PROT 6.6  ALBUMIN 3.6   No results found for this basename: LIPASE, AMYLASE,  in the last 168 hours  Recent Labs Lab 05/28/13 1615  AMMONIA 82*   CBC:  Recent  Labs Lab 05/28/13 1615 05/29/13 0626  WBC 3.9* 3.3*  NEUTROABS 2.1  --   HGB 15.3 13.5  HCT 44.1 39.5  MCV 88.0 87.6  PLT 54* 45*   Cardiac Enzymes:  Recent Labs Lab 05/28/13 1915  CKTOTAL 301*   BNP (last 3 results) No results found for this basename: PROBNP,  in the last 8760 hours CBG: No results found for this basename: GLUCAP,  in the last 168 hours  No results found for this or any previous visit (from the past 240 hour(s)).   Studies: No results found.  Scheduled Meds: . ALPRAZolam  0.5 mg Oral BID  . amitriptyline  50 mg Oral QHS  . dolutegravir  50 mg Oral Daily  . emtricitabine-tenofovir  1 tablet Oral Daily  . lactulose  20 g Oral QID  . pantoprazole  40 mg Oral Daily  . sodium chloride  3 mL Intravenous Q12H  . thiamine  50 mg Oral Daily   Continuous Infusions: . sodium chloride 75 mL/hr at 05/28/13 1926    Active Problems:   HIV DISEASE   HYPERTENSION   ENCEPHALOPATHY, HEPATIC   Orthostatic hypotension    Time spent: 30 minutes.     Travia Onstad  [facility] Pager [phone]. If 7PM-7AM, please contact night-coverage at [email or web address], password [facility] 05/29/2013, 8:40 AM  LOS: 1 day

## 2013-05-30 DIAGNOSIS — K7030 Alcoholic cirrhosis of liver without ascites: Secondary | ICD-10-CM

## 2013-05-30 DIAGNOSIS — D61818 Other pancytopenia: Secondary | ICD-10-CM

## 2013-05-30 DIAGNOSIS — G9340 Encephalopathy, unspecified: Secondary | ICD-10-CM

## 2013-05-30 DIAGNOSIS — F411 Generalized anxiety disorder: Secondary | ICD-10-CM

## 2013-05-30 LAB — COMPREHENSIVE METABOLIC PANEL
ALT: 44 U/L (ref 0–53)
Albumin: 3.5 g/dL (ref 3.5–5.2)
Alkaline Phosphatase: 128 U/L — ABNORMAL HIGH (ref 39–117)
CO2: 23 mEq/L (ref 19–32)
Calcium: 9.1 mg/dL (ref 8.4–10.5)
GFR calc Af Amer: >90 mL/min (ref >90)
GFR calc non Af Amer: >90 mL/min (ref >90)
Glucose, Bld: 120 mg/dL — ABNORMAL HIGH (ref 70–99)
Potassium: 3.9 mEq/L (ref 3.5–5.1)
Sodium: 139 mEq/L (ref 135–145)
Total Protein: 6.3 g/dL (ref 6.0–8.3)

## 2013-05-30 LAB — CBC
Hemoglobin: 14.2 g/dL (ref 13.0–17.0)
MCH: 30.5 pg (ref 26.0–34.0)
MCHC: 34.7 g/dL (ref 30.0–36.0)
Platelets: 47 10*3/uL — ABNORMAL LOW (ref 150–400)
RDW: 16.6 % — ABNORMAL HIGH (ref 11.5–15.5)
WBC: 2.2 10*3/uL — ABNORMAL LOW (ref 4.0–10.5)

## 2013-05-30 LAB — VITAMIN B12: Vitamin B-12: 1799 pg/mL — ABNORMAL HIGH (ref 211–911)

## 2013-05-30 MED ORDER — FUROSEMIDE 40 MG PO TABS: 40.0000 mg | ORAL_TABLET | Freq: Every day | ORAL | Status: DC

## 2013-05-30 MED ORDER — ALPRAZOLAM 1 MG PO TABS: 0.5000 mg | ORAL_TABLET | Freq: Two times a day (BID) | ORAL | Status: DC

## 2013-05-30 MED ORDER — FOLIC ACID 1 MG PO TABS: 1.0000 mg | ORAL_TABLET | Freq: Every day | ORAL | Status: DC

## 2013-05-30 NOTE — Discharge Summary (Signed)
Physician Discharge Summary  Patient ID: [name] MRN: [id number] DOB/AGE: 1965/10/19 47 y.o.  Admit date: 05/28/2013 Discharge date: 05/30/2013  Primary Care Physician:  Carl Deem, MD  Final Discharge Diagnoses:   Acute hepatic encephalopathy- resolved  Secondary discharge diagnosis . Orthostatic hypotension . HIV DISEASE . HYPERTENSION . ENCEPHALOPATHY, HEPATIC  Consults: Infectious disease, Dr. Algis Liming                   Gastroenterology     Recommendations for Outpatient Follow-up:  1. patient was admitted to cut down on Xanax 0.5 mg BID 2. medication changes to antiretrovirals, he has a followup appointment with Dr. Algis Liming this month 3. patient was strongly recommended to not use any high-protein diets for weight loss  Allergies:   Allergies  Allergen Reactions  . Penicillins     REACTION: hives     Discharge Medications:   Medication List         acyclovir 400 MG tablet  Commonly known as:  ZOVIRAX  Take 400 mg by mouth 2 (two) times daily as needed (cold sores).     ALPRAZolam 1 MG tablet  Commonly known as:  XANAX  Take 0.5 tablets (0.5 mg total) by mouth 2 (two) times daily.     aMILoride 5 MG tablet  Commonly known as:  MIDAMOR  Take 10 mg by mouth every morning.     amitriptyline 50 MG tablet  Commonly known as:  ELAVIL  Take 50 mg by mouth at bedtime.     B-complex with vitamin C tablet  Take 1 tablet by mouth daily.     dolutegravir 50 MG tablet  Commonly known as:  TIVICAY  Take 1 tablet (50 mg total) by mouth daily.     emtricitabine-tenofovir 200-300 MG per tablet  Commonly known as:  TRUVADA  Take 1 tablet by mouth daily.     esomeprazole 40 MG capsule  Commonly known as:  NEXIUM  Take 1 capsule (40 mg total) by mouth daily before breakfast.     folic acid 1 MG tablet  Commonly known as:  FOLVITE  Take 1 tablet (1 mg total) by mouth daily.     furosemide 40 MG tablet  Commonly known as:  LASIX  Take 1 tablet (40  mg total) by mouth daily.     lactulose 10 GM/15ML solution  Commonly known as:  CHRONULAC  Take 30 mLs (20 g total) by mouth 4 (four) times daily.     potassium chloride SA 20 MEQ tablet  Commonly known as:  K-DUR,KLOR-CON  TAKE ONE TABLET BY MOUTH ONE TIME DAILY     thiamine 50 MG tablet  Commonly known as:  VITAMIN B-1  Take 50 mg by mouth daily.         Brief H and P: For complete details please refer to admission H and P, but in briefSteven D [name] is a 47 y.o. male with prior h/o HIV, alcoholic cirrhosis of the liver, hypertension, hepatic encephalopathy, came to Dr Daiva Eves office for follow up. As per the family at bedside and the patient, he was more lethargic, slow in speech, some slurring , and gait instability. He was oriented to place , person and time, but repeated his questions. He reports the bowel movements have decreased in frequency, though reports taking lactulose as recommended. In the office he was also found to be orthostatic. He denied any chest pain, sob, fever, chills, cough, or burning micturition. He reported being dehydrated.  Hospital Course:  Acute encephalopathy: Improved, multifactorial, likely due to acute hepatic encephalopathy, ammonia level was 134 at the time of admission versus due to HIV drug interactions, also confounded by benzodiazepines onboard. During the hospitalization patient admitted that he had started taking high-protein diet with to lose weight in the last 3 weeks, which likely contributed to his hyper ammoniemia and worsening encephalopathy. Infectious disease, Dr. Algis Liming was consulted and also changed his HIV medication to Tivicay and ruvada to avoid the drug interactions. Stribild was discontinued. I strongly counseled the patient to stop high-protein diet and also cutdown on his Xanax. He needs to continue lactulose and titrate to at least 2-3 bowel movements in a day.   alcohol-induced liver cirrhosis with varices.: Appreciate for  GI recommendations, given the patient was orthostatic and somewhat dehydrated at the time of admission, Lasix was decreased to 40 mg daily for this week and then he can resume his normal dose once back to his baseline next week.   Day of Discharge BP 126/72  Pulse 68  Temp(Src) 97.6 F (36.4 C) (Oral)  Resp 18  Ht 6' (1.829 m)  Wt 107.566 kg (237 lb 2.2 oz)  BMI 32.15 kg/m2  SpO2 100%  Physical Exam: General: Alert and awake oriented x3 not in any acute distress. HEENT: anicteric sclera, pupils reactive to light and accommodation CVS: S1-S2 clear no murmur rubs or gallops Chest: clear to auscultation bilaterally, no wheezing rales or rhonchi Abdomen: soft nontender, nondistended, normal bowel sounds Extremities: no cyanosis, clubbing or edema noted bilaterally Neuro: Cranial nerves II-XII intact, no focal neurological deficits   The results of significant diagnostics from this hospitalization (including imaging, microbiology, ancillary and laboratory) are listed below for reference.    LAB RESULTS: Basic Metabolic Panel:  Recent Labs Lab 05/28/13 1615 05/29/13 0626 05/30/13 0900  NA 142 138 139  K 4.2 3.3* 3.9  CL 106 107 106  CO2 24 19 23   GLUCOSE 93 83 120*  BUN 11 11 9   CREATININE 0.86 0.79 0.80  CALCIUM 9.2 8.8 9.1  MG 1.9  --   --   PHOS 3.5  --   --    Liver Function Tests:  Recent Labs Lab 05/29/13 0626 05/30/13 0900  AST 46* 50*  ALT 41 44  ALKPHOS 117 128*  BILITOT 1.7* 1.7*  PROT 5.8* 6.3  ALBUMIN 3.3* 3.5   No results found for this basename: LIPASE, AMYLASE,  in the last 168 hours  Recent Labs Lab 05/29/13 1200 05/30/13 0900  AMMONIA 134* 93*   CBC:  Recent Labs Lab 05/28/13 1615 05/29/13 0626 05/30/13 0900  WBC 3.9* 3.3* 2.2*  NEUTROABS 2.1  --   --   HGB 15.3 13.5 14.2  HCT 44.1 39.5 40.9  MCV 88.0 87.6 87.8  PLT 54* 45* 47*   Cardiac Enzymes:  Recent Labs Lab 05/28/13 1915  CKTOTAL 301*   BNP: No components found  with this basename: POCBNP,  CBG: No results found for this basename: GLUCAP,  in the last 168 hours  Significant Diagnostic Studies:  Mr Brain Wo Contrast  05/29/2013   CLINICAL DATA:  47 year old male with HIV, alcoholic cirrhosis, hypertension and hepatic encephalopathy presenting more lethargic and slow in speech and unsteady gait.  EXAM: MRI HEAD WITHOUT CONTRAST  TECHNIQUE: Multiplanar, multiecho pulse sequences of the brain and surrounding structures were obtained without intravenous contrast.  COMPARISON:  05/03/2011 head CT.  No comparison brain MR.  FINDINGS: Exam is motion degraded.  No acute infarct.  No intracranial hemorrhage.  No intracranial mass lesion noted on this unenhanced exam.  Increased signal within the globus pallidus bilaterally and cerebral peduncles. This is in a distribution which has been described with hepatic encephalopathy.  Questionable increased signal in the peri aqueductal region. This can be seen with alcoholic encephalopathy (Wernicke's encephalopathy).  Major intracranial vascular structures are patent.  Cervical medullary junction, pituitary region, pineal region and orbital structures unremarkable.  IMPRESSION: Increased signal within the globus pallidus bilaterally and cerebral peduncles. This is in a distribution which has been described with hepatic encephalopathy.  Questionable increased signal in the peri aqueductal region. This can be seen with alcoholic encephalopathy (Wernicke's encephalopathy).  These results will be called to the ordering clinician or representative by the Radiologist Assistant, and communication documented in the PACS Dashboard.   Electronically Signed   By: Bridgett Larsson M.D.   On: 05/29/2013 15:14    2D ECHO:   Disposition and Follow-up: Discharge Orders   Future Appointments Provider Department Dept Phone   06/11/2013 10:30 AM [facility] for Infectious Disease [phone]   06/25/2013 9:15 AM  Randall Hiss, MD [facility] for Infectious Disease [phone]   07/04/2013 11:30 AM Iva Boop, MD [facility] Gastroenterology [phone]   Future Orders Complete By Expires   Diet - low sodium heart healthy  As directed    Discharge instructions  As directed    Comments:     Please pick it up medications from your pharmacy.   Please stop high-protein diet, continue lactulose, titrate for at least 2-3 bowel movements in a day.  Please note that your Lasix dose is decreased to 40 mg daily. Continue daily dose till end of this week. If you are back to your normal self and not feeling dehydrated, you can resume your previous dose of Lasix 40mg  twice a day.  Please keep your appointment with Dr. Algis Liming.   Increase activity slowly  As directed        DISPOSITION:  Home DIET:  Heart healthy diet   DISCHARGE FOLLOW-UP Follow-up Information   Follow up with Stan Head, MD On 07/04/2013. (11:30 AM)    Specialty:  Gastroenterology   Contact information:   [address] [place] [address] [phone]       Follow up with Acey Lav, MD On 06/11/2013. (at 10:30AM)    Specialty:  Infectious Diseases   Contact information:   [address] [place] [address] [phone]       Time spent on Discharge:  40 minutes  Signed:   Ladarious Kresse M.D. [facility] 05/30/2013, 11:53 AM Pager: [phone]

## 2013-05-30 NOTE — Progress Notes (Signed)
OT Cancellation Note  Patient Details Name: [name] MRN: [id number] DOB: February 03, 1966   Cancelled Treatment:     Orders received and chart reviewed. Pt has been up ambulating on floor independently, fully dressed this am. Spoke w/ pt's MD whom states that pt is being d/c home w/ family this am. Discussion w/ pt reveal no needs, as he is I w/ ADL's & transfers, he states that he has supportive family. Will sign off acute OT at this time, both pt and treating MD are agreeable to this.  Roselie Awkward Dixon 05/30/2013, 10:07 AM

## 2013-05-30 NOTE — Progress Notes (Signed)
NURSING PROGRESS NOTE  [name] [id number] Discharge Data: 05/30/2013 2:22 PM Attending Provider: No att. providers found WUJ:[id number],NWGNFA, MD     Hardie Lora to be D/C'd Home per MD order.  Discussed with the patient the After Visit Summary and all questions fully answered. All IV's discontinued with no bleeding noted. All belongings returned to patient for patient to take home.   Last Vital Signs:  Blood pressure 126/72, pulse 68, temperature 97.6 F (36.4 C), temperature source Oral, resp. rate 18, height 6' (1.829 m), weight 107.566 kg (237 lb 2.2 oz), SpO2 100.00%.  Discharge Medication List   Medication List         acyclovir 400 MG tablet  Commonly known as:  ZOVIRAX  Take 400 mg by mouth 2 (two) times daily as needed (cold sores).     ALPRAZolam 1 MG tablet  Commonly known as:  XANAX  Take 0.5 tablets (0.5 mg total) by mouth 2 (two) times daily.     aMILoride 5 MG tablet  Commonly known as:  MIDAMOR  Take 10 mg by mouth every morning.     amitriptyline 50 MG tablet  Commonly known as:  ELAVIL  Take 50 mg by mouth at bedtime.     B-complex with vitamin C tablet  Take 1 tablet by mouth daily.     dolutegravir 50 MG tablet  Commonly known as:  TIVICAY  Take 1 tablet (50 mg total) by mouth daily.     emtricitabine-tenofovir 200-300 MG per tablet  Commonly known as:  TRUVADA  Take 1 tablet by mouth daily.     esomeprazole 40 MG capsule  Commonly known as:  NEXIUM  Take 1 capsule (40 mg total) by mouth daily before breakfast.     folic acid 1 MG tablet  Commonly known as:  FOLVITE  Take 1 tablet (1 mg total) by mouth daily.     furosemide 40 MG tablet  Commonly known as:  LASIX  Take 1 tablet (40 mg total) by mouth daily.     lactulose 10 GM/15ML solution  Commonly known as:  CHRONULAC  Take 30 mLs (20 g total) by mouth 4 (four) times daily.     potassium chloride SA 20 MEQ tablet  Commonly known as:  K-DUR,KLOR-CON  TAKE ONE TABLET BY  MOUTH ONE TIME DAILY     thiamine 50 MG tablet  Commonly known as:  VITAMIN B-1  Take 50 mg by mouth daily.

## 2013-06-11 ENCOUNTER — Other Ambulatory Visit: Payer: Self-pay | Admitting: Licensed Clinical Social Worker

## 2013-06-11 ENCOUNTER — Other Ambulatory Visit (INDEPENDENT_AMBULATORY_CARE_PROVIDER_SITE_OTHER): Payer: Medicaid Other

## 2013-06-11 DIAGNOSIS — N62 Hypertrophy of breast: Secondary | ICD-10-CM

## 2013-06-11 DIAGNOSIS — B20 Human immunodeficiency virus [HIV] disease: Secondary | ICD-10-CM

## 2013-06-12 LAB — HIV-1 RNA QUANT-NO REFLEX-BLD
HIV 1 RNA Quant: <20 copies/mL (ref <20)
HIV-1 RNA Quant, Log: <1.3 {Log} (ref <1.30)

## 2013-06-12 LAB — T-HELPER CELL (CD4) - (RCID CLINIC ONLY): CD4 % Helper T Cell: 26 % — ABNORMAL LOW (ref 33–55)

## 2013-06-13 ENCOUNTER — Telehealth: Payer: Self-pay

## 2013-06-13 NOTE — Telephone Encounter (Signed)
Spoke to Christel at [facility] [phone] to get Nexium 40mg  approved.  Patient has tried and failed pantoprazole, prilosec, zegerid.  Nexium approved, approval # [id number].  Pharmacy notified.

## 2013-06-22 ENCOUNTER — Other Ambulatory Visit: Payer: Self-pay | Admitting: *Deleted

## 2013-06-22 DIAGNOSIS — F411 Generalized anxiety disorder: Secondary | ICD-10-CM

## 2013-06-22 MED ORDER — ALPRAZOLAM 1 MG PO TABS: 0.5000 mg | ORAL_TABLET | Freq: Two times a day (BID) | ORAL | Status: DC

## 2013-06-22 NOTE — Telephone Encounter (Signed)
Spoke with pharmacist at [facility] in [place]  Verbal order for 0.5 mg Xanax BID, #60 w/ 3 refills given and read back by pharmacist.

## 2013-06-25 ENCOUNTER — Encounter: Payer: Self-pay | Admitting: Infectious Disease

## 2013-06-25 ENCOUNTER — Ambulatory Visit (INDEPENDENT_AMBULATORY_CARE_PROVIDER_SITE_OTHER): Payer: Medicaid Other | Admitting: Infectious Disease

## 2013-06-25 ENCOUNTER — Encounter: Payer: Self-pay | Admitting: *Deleted

## 2013-06-25 VITALS — BP 130/80 | HR 74 | Temp 97.8°F | Wt 265.0 lb

## 2013-06-25 DIAGNOSIS — N62 Hypertrophy of breast: Secondary | ICD-10-CM

## 2013-06-25 DIAGNOSIS — F4323 Adjustment disorder with mixed anxiety and depressed mood: Secondary | ICD-10-CM

## 2013-06-25 DIAGNOSIS — B20 Human immunodeficiency virus [HIV] disease: Secondary | ICD-10-CM

## 2013-06-25 DIAGNOSIS — K7030 Alcoholic cirrhosis of liver without ascites: Secondary | ICD-10-CM

## 2013-06-25 DIAGNOSIS — G9340 Encephalopathy, unspecified: Secondary | ICD-10-CM

## 2013-06-25 NOTE — Progress Notes (Signed)
Subjective:    Patient ID: [name], male    DOB: 02-24-1966, 48 y.o.   MRN: [id number]  HPI   [name] is a 48 y.o. male with HIV infection, cirrhosis,  who had been  doing superbly well on his antiviral regimen,  I had changed him from [place] to [place], and I believe this caused excessively high benzodiazepene levels. On top of this he was doing a low carbohydrate diet and hence unfortunately high protein diet and all of this made him frankly encephalopathic to point where he nearly fell in our clinic and we admitted him to [facility] for treatment.   I have changed his regimen over to Tivicay and Truvada and his VL remains suppressed and CD4 is above 200.  He asked for letter supporting his gym membership which I provided.     Review of Systems  Constitutional: Positive for activity change, appetite change and fatigue. Negative for fever, chills, diaphoresis and unexpected weight change.  HENT: Negative for congestion, rhinorrhea, sinus pressure, sneezing, sore throat and trouble swallowing.   Eyes: Negative for photophobia and visual disturbance.  Respiratory: Negative for cough, chest tightness, shortness of breath, wheezing and stridor.   Cardiovascular: Negative for chest pain, palpitations and leg swelling.  Gastrointestinal: Negative for nausea, vomiting, abdominal pain, diarrhea, constipation, blood in stool, abdominal distention and anal bleeding.  Genitourinary: Negative for dysuria, hematuria, flank pain and difficulty urinating.  Musculoskeletal: Negative for arthralgias, back pain, gait problem, joint swelling and myalgias.  Skin: Negative for color change, pallor, rash and wound.  Neurological: Negative for dizziness, tremors, seizures, weakness and light-headedness.  Hematological: Negative for adenopathy. Does not bruise/bleed easily.  Psychiatric/Behavioral: Negative for behavioral problems, confusion, sleep disturbance, dysphoric mood, decreased  concentration and agitation. The patient is not nervous/anxious.        Objective:   Physical Exam  Constitutional: He is oriented to person, place, and time. He appears well-developed and well-nourished. No distress.  HENT:  Head: Normocephalic and atraumatic.  Mouth/Throat: Oropharynx is clear and moist. No oropharyngeal exudate.    Eyes: Conjunctivae and EOM are normal. Pupils are equal, round, and reactive to light. No scleral icterus.  Neck: Normal range of motion. Neck supple. No JVD present.  Cardiovascular: Normal rate, regular rhythm and normal heart sounds.  Exam reveals no gallop and no friction rub.   No murmur heard. Pulmonary/Chest: Effort normal and breath sounds normal. No respiratory distress. He has no wheezes. He has no rales. He exhibits no tenderness.  Abdominal: Soft. He exhibits distension. He exhibits no mass. There is no tenderness. There is no rebound and no guarding.    Musculoskeletal: He exhibits no edema and no tenderness.       Feet:  Lymphadenopathy:    He has no cervical adenopathy.  Neurological: He is alert and oriented to person, place, and time. No sensory deficit. He exhibits normal muscle tone. Gait normal.  Skin: Skin is warm and dry. He is not diaphoretic. No erythema. No pallor.  Psychiatric: He has a normal mood and affect. His behavior is normal. Judgment and thought content normal.          Assessment & Plan:   HIV: continue  TIVICAY and Truvada.   I spent greater than 25 minutes with the patient including greater than 50% of time in face to face counsel of the patient and in coordination of their care.   Etoh induced cirrhosis with varices: followed by [facility]  Anxiety depression:  care with benzos given his liver disease.  Gynecomastia: Hopefully this may improve off of Sustiva though it could also be due to his cirrhosis!  Lipodystrophy changes and morbid obesity : continue exercise but avoid high protein diets. Letter  written to support gym membership

## 2013-06-27 ENCOUNTER — Other Ambulatory Visit: Payer: Self-pay | Admitting: Internal Medicine

## 2013-06-27 NOTE — Telephone Encounter (Signed)
Spoke to [place] and he said he went back to BID on his lasix after a week from getting out of the hospital.  He said that's what they told him to do.   While we were talking he ask if you would be willing to write a letter for me to fax to the [facility] in [place], phone# [phone],( ATT: Enid Derry) regarding that he has advanced liver disease (perfers we don't put HIV) and is on disability to get his insurance to cover his membership dues.  I told him I would ask and be back in touch.  Thank you [name].

## 2013-06-27 NOTE — Telephone Encounter (Signed)
He was discharged from hospital on 40 mg daily? Please clarify

## 2013-06-27 NOTE — Telephone Encounter (Signed)
Please advise [name]? Thank you. 

## 2013-06-28 NOTE — Telephone Encounter (Signed)
Worth a try [name], thank you.

## 2013-06-28 NOTE — Telephone Encounter (Signed)
OK refill at bid  I can write a letter that just says medical problems and no dx Will that work?

## 2013-07-02 ENCOUNTER — Telehealth: Payer: Self-pay | Admitting: Internal Medicine

## 2013-07-02 ENCOUNTER — Encounter: Payer: Self-pay | Admitting: Internal Medicine

## 2013-07-02 NOTE — Telephone Encounter (Signed)
Letter routed to you.

## 2013-07-02 NOTE — Telephone Encounter (Signed)
Letter mailed. Patient advised

## 2013-07-02 NOTE — Telephone Encounter (Signed)
I spoke with patient.  He is requesting a letter for the [facility] that he has cirrhosis and is on disability.  He is trying to get medicaid to pay for his [employer].    Can you dictate a letter to attention [place]?  He wants the letter to be mailed to his house.

## 2013-07-03 ENCOUNTER — Telehealth: Payer: Self-pay | Admitting: Licensed Clinical Social Worker

## 2013-07-03 NOTE — Telephone Encounter (Signed)
Patient called stating that he is having a hard time weaning down from the Xanax 1 mg to 0.5 mg. He is taking 0.5 mg three times daily. He wanted you to know this but he states that he is not trying to abuse it but he is having increased panic attacks during the day. Please advise.

## 2013-07-03 NOTE — Telephone Encounter (Signed)
This has been addressed, see under letters.

## 2013-07-03 NOTE — Telephone Encounter (Signed)
Is he taking this and the klonopin or just the xanax

## 2013-07-03 NOTE — Telephone Encounter (Signed)
He mentioned going from 1 mg to 0.5 was hard. He would prefer his old rx 1 mg

## 2013-07-03 NOTE — Telephone Encounter (Signed)
Just the xanax

## 2013-07-03 NOTE — Telephone Encounter (Signed)
Well he can take a bit more if needed. How much does he need to take to control his anxiety and does he need a new script?

## 2013-07-03 NOTE — Telephone Encounter (Signed)
That is fine we can go back to that

## 2013-07-04 ENCOUNTER — Ambulatory Visit: Payer: Medicaid Other | Admitting: Internal Medicine

## 2013-07-04 ENCOUNTER — Other Ambulatory Visit: Payer: Self-pay | Admitting: Licensed Clinical Social Worker

## 2013-07-04 DIAGNOSIS — F411 Generalized anxiety disorder: Secondary | ICD-10-CM

## 2013-07-04 MED ORDER — ALPRAZOLAM 1 MG PO TABS: 1.0000 mg | ORAL_TABLET | Freq: Two times a day (BID) | ORAL | Status: DC

## 2013-07-15 ENCOUNTER — Other Ambulatory Visit: Payer: Self-pay | Admitting: Internal Medicine

## 2013-07-16 ENCOUNTER — Telehealth: Payer: Self-pay | Admitting: Licensed Clinical Social Worker

## 2013-07-16 NOTE — Telephone Encounter (Signed)
Patient wanted to know if he could go back on Stribild because he feels his hospitalization was due to his Atkins diet and not a reaction from Stribild. Please advise

## 2013-07-16 NOTE — Telephone Encounter (Signed)
Ok to refill x 1 year 

## 2013-07-16 NOTE — Telephone Encounter (Signed)
May I refill [name]? 

## 2013-07-16 NOTE — Telephone Encounter (Signed)
We are not going back on STRIBILD right now. He should remain on his current regimen. The stribild has significant drug drug interactinos wth his benzodiazpenes etc

## 2013-07-24 ENCOUNTER — Telehealth: Payer: Self-pay | Admitting: Internal Medicine

## 2013-07-24 DIAGNOSIS — K7460 Unspecified cirrhosis of liver: Secondary | ICD-10-CM

## 2013-07-24 DIAGNOSIS — N62 Hypertrophy of breast: Secondary | ICD-10-CM | POA: Insufficient documentation

## 2013-07-24 NOTE — Telephone Encounter (Signed)
Left detailed message for [name] at the [facility] him that we are working on his question by running blood work and we will be back in touch.

## 2013-07-24 NOTE — Telephone Encounter (Signed)
Left detailed messages to call or just come and get a lab test drawn (BMET) per Dr. Carlean Purl.  Orders put in.

## 2013-07-24 NOTE — Telephone Encounter (Signed)
Patient advised to come get lab work.

## 2013-07-24 NOTE — Telephone Encounter (Signed)
Please advise [name], thank you. 

## 2013-07-24 NOTE — Telephone Encounter (Signed)
Have Esco get a BMET

## 2013-07-26 ENCOUNTER — Encounter: Payer: Self-pay | Admitting: *Deleted

## 2013-07-26 ENCOUNTER — Telehealth: Payer: Self-pay | Admitting: Internal Medicine

## 2013-07-26 NOTE — Telephone Encounter (Signed)
Left message for patient to call back  

## 2013-07-27 NOTE — Telephone Encounter (Signed)
Patient wants to have breast reconstructive surgery for his gynecomastia.  He was asking if this will effect his liver.  He is advised that he can discuss with Dr. Carlean Purl at follow up on 08/06/13.

## 2013-07-30 ENCOUNTER — Other Ambulatory Visit (INDEPENDENT_AMBULATORY_CARE_PROVIDER_SITE_OTHER): Payer: Medicaid Other

## 2013-07-30 DIAGNOSIS — K7460 Unspecified cirrhosis of liver: Secondary | ICD-10-CM

## 2013-07-30 LAB — BASIC METABOLIC PANEL
BUN: 11 mg/dL (ref 6–23)
CO2: 24 mEq/L (ref 19–32)
Calcium: 8.7 mg/dL (ref 8.4–10.5)
Chloride: 108 mEq/L (ref 96–112)
Creatinine, Ser: 0.7 mg/dL (ref 0.4–1.5)
GFR: 123.46 mL/min (ref >60.00)
Glucose, Bld: 107 mg/dL — ABNORMAL HIGH (ref 70–99)
POTASSIUM: 4.1 meq/L (ref 3.5–5.1)
Sodium: 139 mEq/L (ref 135–145)

## 2013-07-30 NOTE — Telephone Encounter (Signed)
Patient got his lab work done today [name], results in epic.  The original question was does he need to continue his potassium.  He has an office visit with you 08/06/13.

## 2013-07-31 NOTE — Progress Notes (Signed)
Quick Note:  Potassium level is fine - no change in meds ______

## 2013-08-01 NOTE — Telephone Encounter (Signed)
Patient had lab work and per Dr. Carlean Purl no change in his medicines.  I spoke to Diamond at [place] and told him he can fill the K rx since he is currently on it.

## 2013-08-06 ENCOUNTER — Ambulatory Visit (INDEPENDENT_AMBULATORY_CARE_PROVIDER_SITE_OTHER): Payer: Medicaid Other | Admitting: Internal Medicine

## 2013-08-06 ENCOUNTER — Encounter: Payer: Self-pay | Admitting: Internal Medicine

## 2013-08-06 ENCOUNTER — Other Ambulatory Visit (INDEPENDENT_AMBULATORY_CARE_PROVIDER_SITE_OTHER): Payer: Medicaid Other

## 2013-08-06 VITALS — BP 120/62 | HR 80 | Ht 70.75 in | Wt 274.0 lb

## 2013-08-06 DIAGNOSIS — K7030 Alcoholic cirrhosis of liver without ascites: Secondary | ICD-10-CM

## 2013-08-06 DIAGNOSIS — K7290 Hepatic failure, unspecified without coma: Secondary | ICD-10-CM

## 2013-08-06 DIAGNOSIS — K7682 Hepatic encephalopathy: Secondary | ICD-10-CM

## 2013-08-06 DIAGNOSIS — I8500 Esophageal varices without bleeding: Secondary | ICD-10-CM

## 2013-08-06 LAB — AMMONIA: Ammonia: 146 umol/L — ABNORMAL HIGH (ref 11–35)

## 2013-08-06 NOTE — Patient Instructions (Addendum)
You have been given a separate informational sheet regarding your tobacco use, the importance of quitting and local resources to help you quit.  Your physician has requested that you go to the basement for the following lab work before leaving today: Ammonia  We have sent the following medications to your pharmacy for you to pick up at your convenience: xifaxin   You have been scheduled for an endoscopy with propofol at [facility]. Please follow written instructions given to you at your visit today. If you use inhalers (even only as needed), please bring them with you on the day of your procedure.  I appreciate the opportunity to care for you.

## 2013-08-06 NOTE — Assessment & Plan Note (Signed)
Needs repeat EGD - MAC The risks and benefits as well as alternatives of endoscopic procedure(s) have been discussed and reviewed. All questions answered. The patient agrees to proceed.

## 2013-08-06 NOTE — Assessment & Plan Note (Addendum)
Stable overall, no evidence of fluid overload today, continue current diuretic regimen

## 2013-08-06 NOTE — Assessment & Plan Note (Addendum)
Retry Xifaxan in addition to continuing lactulose. No significant prior approval. I think she will do better with a combination of Xifaxan lactulose. He was not overtly or significantly encephalopathic today though he Flexeril may have been affecting things somewhat. Ammonia level will be checked the day

## 2013-08-06 NOTE — Progress Notes (Signed)
Quick Note:  Ammonia is higher Is he taking his lactulose? Michela Pitcher he was but this raises ? ______

## 2013-08-06 NOTE — Progress Notes (Signed)
Subjective:    Patient ID: [name], male    DOB: 24-May-1966, 48 y.o.   MRN: [id number]  HPI The patient is here for followup of his cirrhosis due to alcohol, he was hospitalized in December with hepatic encephalopathy that was attributed to a change in his HIV treatment as well as him being on a very high protein Atkins like diet to lose weight. He reports that he is improved since then though he is a little bit wobbly at times right now on Flexeril for back pain. He continues to exercise. He is pursuing gynecomastia surgery at [facility]. His mental status problems have improved since hospitalization. He is back to his regular diuretic doses. He is taking his lactulose and having 3-4 soft stools a day.  Allergies  Allergen Reactions  . Penicillins     REACTION: hives   Outpatient Prescriptions Prior to Visit  Medication Sig Dispense Refill  . acyclovir (ZOVIRAX) 400 MG tablet Take 400 mg by mouth 2 (two) times daily as needed (cold sores).       . ALPRAZolam (XANAX) 1 MG tablet Take 1 tablet (1 mg total) by mouth 2 (two) times daily.  60 tablet  3  . aMILoride (MIDAMOR) 5 MG tablet Take 10 mg by mouth every morning.       Marland Kitchen aMILoride (MIDAMOR) 5 MG tablet TAKE 2 TABLETS BY MOUTH EVERY DAY  60 tablet  11  . amitriptyline (ELAVIL) 50 MG tablet Take 50 mg by mouth at bedtime.      . B Complex-C (B-COMPLEX WITH VITAMIN C) tablet Take 1 tablet by mouth daily.      . dolutegravir (TIVICAY) 50 MG tablet Take 1 tablet (50 mg total) by mouth daily.  30 tablet  11  . emtricitabine-tenofovir (TRUVADA) 200-300 MG per tablet Take 1 tablet by mouth daily.  30 tablet  11  . esomeprazole (NEXIUM) 40 MG capsule Take 1 capsule (40 mg total) by mouth daily before breakfast.  30 capsule  11  . folic acid (FOLVITE) 1 MG tablet Take 1 tablet (1 mg total) by mouth daily.  30 tablet  4  . furosemide (LASIX) 40 MG tablet TAKE 1 TABLET BY MOUTH TWICE DAILY  60 tablet  0  . lactulose (CHRONULAC) 10  GM/15ML solution Take 30 mLs (20 g total) by mouth 4 (four) times daily.  4266 mL  6  . potassium chloride SA (K-DUR,KLOR-CON) 20 MEQ tablet TAKE ONE TABLET BY MOUTH ONE TIME DAILY  30 tablet  3  . thiamine (VITAMIN B-1) 50 MG tablet Take 50 mg by mouth daily.      . furosemide (LASIX) 40 MG tablet Take 1 tablet (40 mg total) by mouth daily.  30 tablet     No facility-administered medications prior to visit.   Past Medical History  Diagnosis Date  . HIV DISEASE 06/23/2006  . HYPERLIPIDEMIA 06/23/2006  . ANXIETY 06/23/2006  . DEPRESSION 06/23/2006  . 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 HYPERTENSION 06/23/2006  . 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 Ascites 11/13/2009  . STRAIN, CHEST WALL 03/15/2007  . 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 Gastric ulcer 06/2010  . Alcoholic cirrhosis of liver   . Alcoholism, chronic   . Iron deficiency anemia   . GERD (gastroesophageal reflux disease)   . SBP (spontaneous bacterial peritonitis) 05/03/2011    Suspected by high leukocytes  on paracentesis. Clinical scenario also compatible. November 2012 responded to Levaquin. Started on trimethoprim-sulfamethoxazole double strength daily for prophylaxis.   . Obesity (BMI 30-39.9)     BMI 34 kg/m^2  . Left foot infection   . Skin cancer     left calf  . Blood dyscrasia     HIV  . Ascites   . Portal hypertensive gastropathy 01/02/2013   Past Surgical History  Procedure Laterality Date  . Esophagogastroduodenoscopy w/ banding  06/26/2010    variceal ligation  . Carpal tunnel release      left  . Esophagogastroduodenoscopy  07/01/2010;  08/12/10    small varices, gastric ulcer  . Esophagogastroduodenoscopy  10/26/2011    Procedure: ESOPHAGOGASTRODUODENOSCOPY (EGD);  Surgeon: Gatha Mayer, MD;  Location: [facility];  Service: Endoscopy;  Laterality: N/A;  . Upper gastrointestinal endoscopy    .  Colonoscopy  march 2013  . Esophagogastroduodenoscopy N/A 01/02/2013    Procedure: ESOPHAGOGASTRODUODENOSCOPY (EGD);  Surgeon: Gatha Mayer, MD;  Location: [facility];  Service: Endoscopy;  Laterality: N/A;  . Gastric varices banding N/A 01/02/2013    Procedure: GASTRIC VARICES BANDING;  Surgeon: Gatha Mayer, MD;  Location: [facility];  Service: Endoscopy;  Laterality: N/A;  possible banding  . Esophagogastroduodenoscopy N/A 04/23/2013    Procedure: ESOPHAGOGASTRODUODENOSCOPY (EGD);  Surgeon: Gatha Mayer, MD;  Location: [facility];  Service: Endoscopy;  Laterality: N/A;  . Esophagogastroduodenoscopy N/A 05/01/2013    Procedure: ESOPHAGOGASTRODUODENOSCOPY (EGD);  Surgeon: Gatha Mayer, MD;  Location: [facility];  Service: Endoscopy;  Laterality: N/A;  follow-up varices and possibly band them  . Esophageal banding  05/01/2013    Procedure: ESOPHAGEAL BANDING;  Surgeon: Gatha Mayer, MD;  Location: [facility];  Service: Endoscopy;;     Review of Systems Recently started Flexeril 5 mg twice a day for her back strain and is due to have PT.    Objective:   Physical Exam General:  NAD, obese Eyes:   anicteric Lungs:  Clear Chest:  Gynecomastia Heart:  S1S2 no rubs, murmurs or gallops Abdomen:  Obese, soft and nontender, BS+, no obvious ascites Ext:   no edema Neuro: Alert noted x3 though mentation slightly slow today, he took Flexeril this morning, speech is clear, no asterixis  Data Reviewed:  Hospitalization, labs from that time and subsequently has flowed in the electronic medical record     Assessment & Plan:   1. ENCEPHALOPATHY, HEPATIC   2. Alcoholic cirrhosis of liver   3. Esophageal varices with prior bleeding

## 2013-08-07 NOTE — Progress Notes (Signed)
Quick Note:  Just prescribed that yesterday so he should get on it and lets have him see APP in about 1 week  ______

## 2013-08-09 ENCOUNTER — Encounter (HOSPITAL_COMMUNITY): Payer: Self-pay | Admitting: *Deleted

## 2013-08-14 ENCOUNTER — Encounter (HOSPITAL_COMMUNITY): Payer: Self-pay | Admitting: Pharmacy Technician

## 2013-08-16 ENCOUNTER — Encounter: Payer: Self-pay | Admitting: Physician Assistant

## 2013-08-16 ENCOUNTER — Other Ambulatory Visit (INDEPENDENT_AMBULATORY_CARE_PROVIDER_SITE_OTHER): Payer: Medicaid Other

## 2013-08-16 ENCOUNTER — Ambulatory Visit (INDEPENDENT_AMBULATORY_CARE_PROVIDER_SITE_OTHER): Payer: Medicaid Other | Admitting: Physician Assistant

## 2013-08-16 VITALS — BP 120/74 | HR 76 | Ht 70.75 in | Wt 272.0 lb

## 2013-08-16 DIAGNOSIS — K7460 Unspecified cirrhosis of liver: Secondary | ICD-10-CM

## 2013-08-16 DIAGNOSIS — K7682 Hepatic encephalopathy: Secondary | ICD-10-CM

## 2013-08-16 DIAGNOSIS — K7290 Hepatic failure, unspecified without coma: Secondary | ICD-10-CM

## 2013-08-16 LAB — BASIC METABOLIC PANEL
BUN: 11 mg/dL (ref 6–23)
CO2: 24 meq/L (ref 19–32)
CREATININE: 0.8 mg/dL (ref 0.4–1.5)
Calcium: 9.2 mg/dL (ref 8.4–10.5)
Chloride: 106 mEq/L (ref 96–112)
GFR: 112.54 mL/min (ref >60.00)
Glucose, Bld: 93 mg/dL (ref 70–99)
Potassium: 4 mEq/L (ref 3.5–5.1)
Sodium: 136 mEq/L (ref 135–145)

## 2013-08-16 LAB — PROTIME-INR
INR: 1.3 ratio — ABNORMAL HIGH (ref 0.8–1.0)
Prothrombin Time: 13.9 s — ABNORMAL HIGH (ref 10.2–12.4)

## 2013-08-16 LAB — AMMONIA: Ammonia: 103 umol/L — ABNORMAL HIGH (ref 11–35)

## 2013-08-16 MED ORDER — LACTULOSE 10 GM/15ML PO SOLN: ORAL | Status: DC

## 2013-08-16 NOTE — Progress Notes (Signed)
Agree with Ms. Esterwood's assessment and plan. Creta Dorame E. Chole Driver, MD, FACG   

## 2013-08-16 NOTE — Progress Notes (Signed)
Subjective:    Patient ID: [name], male    DOB: May 06, 1966, 48 y.o.   MRN: [id number]  HPI  [name] is a 48 year old white male well known to Dr. Carlean Purl with history of decompensated alcoholic cirrhosis. Patient is abstinent. He has history of esophageal varices and prior bleed, SBP, chronic GERD, HIV, depression, peripheral neuropathy, and hepatic encephalopathy. Comes back in today for followup of hepatic encephalopathy. He had been seen by Dr. Carlean Purl on February 23 and at that time had a venous ammonia level drawn which was significantly elevated at 146. Patient had been using Chronulac but uncertain as to compliance with dosing. He had Xifaxan added to his regimen 550 twice daily since that time.  Patient says that he feels fine currently he feels that he's mentating normally, he has not had any clumsiness or excessive sleepiness. He states he is taking the Xifaxan twice daily and taking Chronulac 3-4 times daily. He is having 2-3 bowel movements per day. He has no specific complaints. Last EGD was done in November of 2014 and underwent variceal band ligation x3 also noted to have portal hypertensive gastropathy. He needs a followup EGD with banding if indicated. Patient has been to see a Psychiatric nurse at [facility] regarding his gynecomastia which he finds quite distressing and says that he is trying to get scheduled to have a breast reduction procedure done. He is under the impression that he had been cleared from Dr. Celesta Aver standpoint.    Review of Systems  Constitutional: Negative.   HENT: Negative.   Eyes: Negative.   Respiratory: Negative.   Cardiovascular: Negative.   Gastrointestinal: Positive for abdominal distention.  Endocrine: Negative.   Genitourinary: Negative.   Musculoskeletal: Negative.   Allergic/Immunologic: Negative.   Neurological: Negative.   Hematological: Negative.   Psychiatric/Behavioral: Negative.    Outpatient  Prescriptions Prior to Visit  Medication Sig Dispense Refill  . acyclovir (ZOVIRAX) 400 MG tablet Take 400 mg by mouth 2 (two) times daily as needed (cold sores).       . ALPRAZolam (XANAX) 1 MG tablet Take 1 tablet (1 mg total) by mouth 2 (two) times daily.  60 tablet  3  . aMILoride (MIDAMOR) 5 MG tablet Take 10 mg by mouth every morning.       Marland Kitchen amitriptyline (ELAVIL) 50 MG tablet Take 50 mg by mouth at bedtime.      . B Complex-C (B-COMPLEX WITH VITAMIN C) tablet Take 1 tablet by mouth daily.      . dolutegravir (TIVICAY) 50 MG tablet Take 1 tablet (50 mg total) by mouth daily.  30 tablet  11  . emtricitabine-tenofovir (TRUVADA) 200-300 MG per tablet Take 1 tablet by mouth daily.  30 tablet  11  . esomeprazole (NEXIUM) 40 MG capsule Take 1 capsule (40 mg total) by mouth daily before breakfast.  30 capsule  11  . folic acid (FOLVITE) 1 MG tablet Take 1 tablet (1 mg total) by mouth daily.  30 tablet  4  . furosemide (LASIX) 40 MG tablet Take 40 mg by mouth 2 (two) times daily.      [name] Glycol-Propyl Glycol (SYSTANE OP) Apply 1 drop to eye as needed (dry eyes).      . potassium chloride SA (K-DUR,KLOR-CON) 20 MEQ tablet Take 20 mEq by mouth 2 (two) times daily.      Marland Kitchen thiamine (VITAMIN B-1) 50 MG tablet Take 50 mg by mouth daily.      Marland Kitchen  lactulose (CHRONULAC) 10 GM/15ML solution Take 30 mLs (20 g total) by mouth 4 (four) times daily.  4266 mL  6   No facility-administered medications prior to visit.   Allergies  Allergen Reactions  . Penicillins     REACTION: hives   Patient Active Problem List   Diagnosis Date Noted  . Orthostatic hypotension 05/28/2013  . Personal history of failed moderate sedation- MUST HAVE MAC OR GENERAL 05/01/2013  . Portal hypertensive gastropathy 01/02/2013  . Iron deficiency anemia 08/18/2011  . Acquired pancytopenia 05/19/2011  . Insomnia 12/21/2010  . Esophageal varices with prior bleeding 07/15/2010  . Alcoholic cirrhosis of liver 07/15/2010  .  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 HIV DISEASE 06/23/2006  . HYPERLIPIDEMIA 06/23/2006  . ANXIETY 06/23/2006  . DEPRESSION 06/23/2006  . HYPERTENSION 06/23/2006  . Reflux esophagitis 06/23/2006       History  Substance Use Topics  . Smoking status: Current Every Day Smoker -- 0.10 packs/day for 10 years    Types: Cigarettes  . Smokeless tobacco: Never Used     Comment: form given 05-19-11  . Alcohol Use: No     Comment: pt is an alcoholic currently in [place] meetings   family history includes Alcohol abuse in his other; Breast cancer in his maternal aunt and mother; Drug abuse in his brother; Heart disease in his brother, maternal uncle, and mother; Hyperlipidemia in his mother; Hypertension in his father and mother; Irritable bowel syndrome in his father and paternal aunt. There is no history of Colon cancer.  Objective:   Physical Exam white male in no acute distress, pleasant- mentation appropriate. Blood pressure 120/74 pulse 76 height 5 foot 10 weight 272. HEENT;nontraumatic cephalic EOMI PERRLA sclera anicteric, Supple; no JVD, Cardiovascular; regular rate and rhythm with S1-S2 no murmur or gallop, Pulm; clear bilaterally, Abdomen ;large soft nontender no appreciable fluid wave no palpable mass or hepatosplenomegaly, Extremities; no clubbing cyanosis or edema, Neuro ;psych mood and affect appropriate- no asterixis        Assessment & Plan:  #1  48 yo male with decompensated alcohol-induced cirrhosis, patient is abstinent #2 hepatic encephalopathy-recent significant elevation venous ammonia level. Currently symptomatically improved with addition of Xifaxan. Patient also mentions that he had been on an Atkins diet and eating a lot of protein at that time those levels were drawn. #3 history of esophageal varices last banding  November 2014 due for followup #4 HIV disease-on treatment #5/weight gain and gynecomastia-patient had been on a Atripla which has been stopped. Obviously gynecomastia can be associated with cirrhosis as well. Patient is focused on having a surgical procedure for reduction but is at high risk for complications with surgery and anesthesia given decompensated cirrhosis and is advised against having an elective procedure done #6 anxiety/depression.  Plan; continue Chronulac 30 cc 4 times daily Continue Xifaxan 550 twice daily Schedule for upper endoscopy with esophageal variceal banding with Dr. Carlean Purl for followup Repeat venous ammonia ,INR today and BMET Discussion with patient regarding continued exercise and carbon modified diet and advised against consuming more than 60 g of protein per day

## 2013-08-16 NOTE — Patient Instructions (Addendum)
You have been given a separate informational sheet regarding your tobacco use, the importance of quitting and local resources to help you quit. Please go to the basement level to have your labs drawn.   Continue the Xifaxan twice daily. We sent prescription for Chronulac ( Lactulose ) Take 30 cc 3-4 times daily.  [place] ( [address]   You are scheduled for the Endoscopy with Dr Carlean Purl at [facility] for 09-04-2013.  Follow the instructions you have.

## 2013-08-23 ENCOUNTER — Other Ambulatory Visit: Payer: Medicaid Other

## 2013-08-23 DIAGNOSIS — B20 Human immunodeficiency virus [HIV] disease: Secondary | ICD-10-CM

## 2013-08-23 LAB — COMPLETE METABOLIC PANEL WITH GFR
ALBUMIN: 3.8 g/dL (ref 3.5–5.2)
ALT: 33 U/L (ref 0–53)
AST: 44 U/L — AB (ref 0–37)
Alkaline Phosphatase: 112 U/L (ref 39–117)
BUN: 9 mg/dL (ref 6–23)
CHLORIDE: 107 meq/L (ref 96–112)
CO2: 24 meq/L (ref 19–32)
CREATININE: 0.75 mg/dL (ref 0.50–1.35)
Calcium: 8.7 mg/dL (ref 8.4–10.5)
GFR, Est Non African American: >89 mL/min
Glucose, Bld: 112 mg/dL — ABNORMAL HIGH (ref 70–99)
POTASSIUM: 3.5 meq/L (ref 3.5–5.3)
Sodium: 140 mEq/L (ref 135–145)
Total Bilirubin: 1.5 mg/dL — ABNORMAL HIGH (ref 0.2–1.2)
Total Protein: 5.9 g/dL — ABNORMAL LOW (ref 6.0–8.3)

## 2013-08-23 LAB — CBC WITH DIFFERENTIAL/PLATELET
Basophils Absolute: 0 10*3/uL (ref 0.0–0.1)
Basophils Relative: 1 % (ref 0–1)
EOS PCT: 5 % (ref 0–5)
Eosinophils Absolute: 0.1 10*3/uL (ref 0.0–0.7)
HEMATOCRIT: 37.8 % — AB (ref 39.0–52.0)
HEMOGLOBIN: 12.9 g/dL — AB (ref 13.0–17.0)
LYMPHS ABS: 0.9 10*3/uL (ref 0.7–4.0)
LYMPHS PCT: 37 % (ref 12–46)
MCH: 27.7 pg (ref 26.0–34.0)
MCHC: 34.1 g/dL (ref 30.0–36.0)
MCV: 81.1 fL (ref 78.0–100.0)
MONO ABS: 0.2 10*3/uL (ref 0.1–1.0)
MONOS PCT: 9 % (ref 3–12)
NEUTROS ABS: 1.2 10*3/uL — AB (ref 1.7–7.7)
Neutrophils Relative %: 48 % (ref 43–77)
Platelets: 75 10*3/uL — ABNORMAL LOW (ref 150–400)
RBC: 4.66 MIL/uL (ref 4.22–5.81)
RDW: 16.1 % — ABNORMAL HIGH (ref 11.5–15.5)
WBC: 2.5 10*3/uL — AB (ref 4.0–10.5)

## 2013-08-24 LAB — T-HELPER CELL (CD4) - (RCID CLINIC ONLY)
CD4 T CELL ABS: 240 /uL — AB (ref 400–2700)
CD4 T CELL HELPER: 26 % — AB (ref 33–55)

## 2013-08-26 LAB — HIV-1 RNA QUANT-NO REFLEX-BLD
HIV 1 RNA Quant: 23 copies/mL — ABNORMAL HIGH (ref <20)
HIV-1 RNA QUANT, LOG: 1.36 {Log} — AB (ref <1.30)

## 2013-08-30 ENCOUNTER — Telehealth: Payer: Self-pay | Admitting: Internal Medicine

## 2013-08-30 NOTE — Telephone Encounter (Signed)
Mother had questions about upcoming EGD and if he could take his am meds.  All questions answered.

## 2013-09-04 ENCOUNTER — Ambulatory Visit (HOSPITAL_COMMUNITY)
Admission: RE | Admit: 2013-09-04 | Discharge: 2013-09-04 | Disposition: A | Discharge status: Home / Self Care | Payer: Medicaid Other | Source: Ambulatory Visit | Attending: Internal Medicine | Admitting: Internal Medicine

## 2013-09-04 ENCOUNTER — Encounter (HOSPITAL_COMMUNITY)
Admission: RE | Disposition: A | Discharge status: Home / Self Care | Payer: Self-pay | Source: Ambulatory Visit | Attending: Internal Medicine

## 2013-09-04 ENCOUNTER — Ambulatory Visit (HOSPITAL_COMMUNITY): Payer: Medicaid Other | Admitting: Anesthesiology

## 2013-09-04 ENCOUNTER — Encounter (HOSPITAL_COMMUNITY): Payer: Self-pay

## 2013-09-04 ENCOUNTER — Encounter (HOSPITAL_COMMUNITY): Payer: Medicaid Other | Admitting: Anesthesiology

## 2013-09-04 DIAGNOSIS — I8500 Esophageal varices without bleeding: Secondary | ICD-10-CM

## 2013-09-04 DIAGNOSIS — B20 Human immunodeficiency virus [HIV] disease: Secondary | ICD-10-CM | POA: Insufficient documentation

## 2013-09-04 DIAGNOSIS — K7682 Hepatic encephalopathy: Secondary | ICD-10-CM | POA: Insufficient documentation

## 2013-09-04 DIAGNOSIS — R635 Abnormal weight gain: Secondary | ICD-10-CM | POA: Insufficient documentation

## 2013-09-04 DIAGNOSIS — F3289 Other specified depressive episodes: Secondary | ICD-10-CM | POA: Insufficient documentation

## 2013-09-04 DIAGNOSIS — F17200 Nicotine dependence, unspecified, uncomplicated: Secondary | ICD-10-CM | POA: Insufficient documentation

## 2013-09-04 DIAGNOSIS — K766 Portal hypertension: Secondary | ICD-10-CM | POA: Insufficient documentation

## 2013-09-04 DIAGNOSIS — I8510 Secondary esophageal varices without bleeding: Secondary | ICD-10-CM | POA: Insufficient documentation

## 2013-09-04 DIAGNOSIS — K7030 Alcoholic cirrhosis of liver without ascites: Secondary | ICD-10-CM | POA: Insufficient documentation

## 2013-09-04 DIAGNOSIS — F411 Generalized anxiety disorder: Secondary | ICD-10-CM | POA: Insufficient documentation

## 2013-09-04 DIAGNOSIS — K319 Disease of stomach and duodenum, unspecified: Secondary | ICD-10-CM

## 2013-09-04 DIAGNOSIS — N62 Hypertrophy of breast: Secondary | ICD-10-CM | POA: Insufficient documentation

## 2013-09-04 DIAGNOSIS — K3189 Other diseases of stomach and duodenum: Secondary | ICD-10-CM

## 2013-09-04 DIAGNOSIS — Z79899 Other long term (current) drug therapy: Secondary | ICD-10-CM | POA: Insufficient documentation

## 2013-09-04 DIAGNOSIS — F1020 Alcohol dependence, uncomplicated: Secondary | ICD-10-CM | POA: Insufficient documentation

## 2013-09-04 DIAGNOSIS — E785 Hyperlipidemia, unspecified: Secondary | ICD-10-CM | POA: Insufficient documentation

## 2013-09-04 DIAGNOSIS — G9009 Other idiopathic peripheral autonomic neuropathy: Secondary | ICD-10-CM | POA: Insufficient documentation

## 2013-09-04 DIAGNOSIS — K7290 Hepatic failure, unspecified without coma: Secondary | ICD-10-CM | POA: Insufficient documentation

## 2013-09-04 DIAGNOSIS — K219 Gastro-esophageal reflux disease without esophagitis: Secondary | ICD-10-CM | POA: Insufficient documentation

## 2013-09-04 DIAGNOSIS — F329 Major depressive disorder, single episode, unspecified: Secondary | ICD-10-CM | POA: Insufficient documentation

## 2013-09-04 HISTORY — PX: ESOPHAGOGASTRODUODENOSCOPY: SHX5428

## 2013-09-04 SURGERY — EGD (ESOPHAGOGASTRODUODENOSCOPY)
Anesthesia: Monitor Anesthesia Care

## 2013-09-04 MED ORDER — FENTANYL CITRATE 0.05 MG/ML IJ SOLN
INTRAMUSCULAR | Status: DC | PRN
  Administered 2013-09-04: 50 ug via INTRAVENOUS

## 2013-09-04 MED ORDER — PROPOFOL 10 MG/ML IV BOLUS
INTRAVENOUS | Status: AC
  Filled 2013-09-04: qty 20

## 2013-09-04 MED ORDER — LIDOCAINE HCL 1 % IJ SOLN
INTRAMUSCULAR | Status: DC | PRN
  Administered 2013-09-04: 50 mg via INTRADERMAL

## 2013-09-04 MED ORDER — LACTATED RINGERS IV SOLN
INTRAVENOUS | Status: DC
Start: 2013-09-04 — End: 2013-09-04
  Administered 2013-09-04: 1000 mL via INTRAVENOUS

## 2013-09-04 MED ORDER — MIDAZOLAM HCL 5 MG/5ML IJ SOLN
INTRAMUSCULAR | Status: DC | PRN
  Administered 2013-09-04 (×2): 1 mg via INTRAVENOUS

## 2013-09-04 MED ORDER — SODIUM CHLORIDE 0.9 % IV SOLN: INTRAVENOUS | Status: DC

## 2013-09-04 MED ORDER — BUTAMBEN-TETRACAINE-BENZOCAINE 2-2-14 % EX AERO
INHALATION_SPRAY | CUTANEOUS | Status: DC | PRN
  Administered 2013-09-04: 1 via TOPICAL

## 2013-09-04 MED ORDER — FENTANYL CITRATE 0.05 MG/ML IJ SOLN
INTRAMUSCULAR | Status: AC
  Filled 2013-09-04: qty 2

## 2013-09-04 MED ORDER — KETAMINE HCL 10 MG/ML IJ SOLN
INTRAMUSCULAR | Status: DC | PRN
  Administered 2013-09-04: 10 mg via INTRAVENOUS

## 2013-09-04 MED ORDER — PROPOFOL INFUSION 10 MG/ML OPTIME
INTRAVENOUS | Status: DC | PRN
  Administered 2013-09-04: 125 ug/kg/min via INTRAVENOUS

## 2013-09-04 MED ORDER — KETAMINE HCL 10 MG/ML IJ SOLN
INTRAMUSCULAR | Status: AC
  Filled 2013-09-04: qty 1

## 2013-09-04 MED ORDER — MIDAZOLAM HCL 2 MG/2ML IJ SOLN
INTRAMUSCULAR | Status: AC
  Filled 2013-09-04: qty 2

## 2013-09-04 MED ORDER — LIDOCAINE HCL (CARDIAC) 20 MG/ML IV SOLN
INTRAVENOUS | Status: AC
  Filled 2013-09-04: qty 5

## 2013-09-04 NOTE — Discharge Instructions (Signed)
The varices were flat - so did not need bands. Anticipate looking again in 1 year. I would like to see you in the office in June. I appreciate the opportunity to care for you. Gatha Mayer, MD, FACG  YOU HAD AN ENDOSCOPIC PROCEDURE TODAY: Refer to the procedure report and other information in the discharge instructions given to you for any specific questions about what was found during the examination. If this information does not answer your questions, please call Dr. Celesta Aver office at [phone] to clarify.   YOU SHOULD EXPECT: Some feelings of bloating in the abdomen. Passage of more gas than usual. Walking can help get rid of the air that was put into your GI tract during the procedure and reduce the bloating. If you had a lower endoscopy (such as a colonoscopy or flexible sigmoidoscopy) you may notice spotting of blood in your stool or on the toilet paper. Some abdominal soreness may be present for a day or two, also.  DIET: Your first meal following the procedure should be a light meal and then it is ok to progress to your normal diet. A half-sandwich or bowl of soup is an example of a good first meal. Heavy or fried foods are harder to digest and may make you feel nauseous or bloated. Drink plenty of fluids but you should avoid alcoholic beverages for 24 hours.   ACTIVITY: Your care partner should take you home directly after the procedure. You should plan to take it easy, moving slowly for the rest of the day. You can resume normal activity the day after the procedure however YOU SHOULD NOT DRIVE, use power tools, machinery or perform tasks that involve climbing or major physical exertion for 24 hours (because of the sedation medicines used during the test).   SYMPTOMS TO REPORT IMMEDIATELY: A gastroenterologist can be reached at any hour. Please call [phone]  for any of the following symptoms:  Following upper endoscopy (EGD, EUS, ERCP, esophageal dilation) Vomiting of blood or  coffee ground material  New, significant abdominal pain  New, significant chest pain or pain under the shoulder blades  Painful or persistently difficult swallowing  New shortness of breath  Black, tarry-looking or red, bloody stools  FOLLOW UP:  If any biopsies were taken you will be contacted by phone or by letter within the next 1-3 weeks. Call [phone]  if you have not heard about the biopsies in 3 weeks.  Please also call with any specific questions about appointments or follow up tests.

## 2013-09-04 NOTE — Anesthesia Postprocedure Evaluation (Signed)
  Anesthesia Post-op Note  Patient: [name]  Procedure(s) Performed: Procedure(s) (LRB): ESOPHAGOGASTRODUODENOSCOPY (EGD) (N/A)  Patient Location: PACU  Anesthesia Type: MAC  Level of Consciousness: awake and alert   Airway and Oxygen Therapy: Patient Spontanous Breathing  Post-op Pain: mild  Post-op Assessment: Post-op Vital signs reviewed, Patient's Cardiovascular Status Stable, Respiratory Function Stable, Patent Airway and No signs of Nausea or vomiting  Last Vitals:  Filed Vitals:   09/04/13 0954  BP: 121/76  Pulse: 72  Temp:   Resp: 14    Post-op Vital Signs: stable   Complications: No apparent anesthesia complications

## 2013-09-04 NOTE — Interval H&P Note (Signed)
History and Physical Interval Note:  09/04/2013 9:25 AM  [name]  has presented today for surgery, with the diagnosis of esophageal varices/cirrhosis  The various methods of treatment have been discussed with the patient and family. After consideration of risks, benefits and other options for treatment, the patient has consented to  Procedure(s): ESOPHAGOGASTRODUODENOSCOPY (EGD) (N/A) GASTRIC VARICES BANDING (N/A) as a surgical intervention .  The patient's history has been reviewed, patient examined, no change in status, stable for surgery.  I have reviewed the patient's chart and labs.  Questions were answered to the patient's satisfaction.     Silvano Rusk

## 2013-09-04 NOTE — Transfer of Care (Signed)
Immediate Anesthesia Transfer of Care Note  Patient: [name]  Procedure(s) Performed: Procedure(s): ESOPHAGOGASTRODUODENOSCOPY (EGD) (N/A)  Patient Location: PACU and Endoscopy Unit  Anesthesia Type:MAC  Level of Consciousness: oriented, sedated and patient cooperative  Airway & Oxygen Therapy: Patient Spontanous Breathing and Patient connected to nasal cannula oxygen  Post-op Assessment: Report given to PACU RN, Post -op Vital signs reviewed and stable and Patient moving all extremities  Post vital signs: Reviewed and stable  Complications: No apparent anesthesia complications

## 2013-09-04 NOTE — Anesthesia Preprocedure Evaluation (Signed)
Anesthesia Evaluation  Patient identified by MRN, date of birth, ID band Patient awake    Reviewed: Allergy & Precautions, H&P , NPO status , Patient's Chart, lab work & pertinent test results  Airway Mallampati: II TM Distance: >3 FB Neck ROM: Full    Dental no notable dental hx.    Pulmonary Current Smoker,  breath sounds clear to auscultation  Pulmonary exam normal       Cardiovascular hypertension, Pt. on medications Rhythm:Regular Rate:Normal + Systolic murmurs    Neuro/Psych negative neurological ROS  negative psych ROS   GI/Hepatic GERD-  ,(+) Cirrhosis -  Esophageal Varices and ascites  substance abuse  alcohol use,   Endo/Other  negative endocrine ROS  Renal/GU negative Renal ROS  negative genitourinary   Musculoskeletal negative musculoskeletal ROS (+)   Abdominal   Peds negative pediatric ROS (+)  Hematology  (+) HIV,   Anesthesia Other Findings   Reproductive/Obstetrics negative OB ROS                           Anesthesia Physical Anesthesia Plan  ASA: III  Anesthesia Plan: General and MAC   Post-op Pain Management:    Induction: Intravenous  Airway Management Planned: Nasal Cannula  Additional Equipment:   Intra-op Plan:   Post-operative Plan: Extubation in OR  Informed Consent: I have reviewed the patients History and Physical, chart, labs and discussed the procedure including the risks, benefits and alternatives for the proposed anesthesia with the patient or authorized representative who has indicated his/her understanding and acceptance.   Dental advisory given  Plan Discussed with: CRNA and Surgeon  Anesthesia Plan Comments:         Anesthesia Quick Evaluation

## 2013-09-04 NOTE — Op Note (Signed)
[facility] [place], [address]   ENDOSCOPY PROCEDURE REPORT  PATIENT: [name], [name]  MR#: [id number] BIRTHDATE: Sep 02, 1965 , 47  yrs. old GENDER: Male ENDOSCOPIST: Gatha Mayer, MD, [facility] PROCEDURE DATE:  09/04/2013 PROCEDURE:  Esophagoscopy ASA CLASS:     Class III INDICATIONS:  Surveillance.  Esophageal Varices MEDICATIONS: See Anesthesia Report and MAC sedation, administered by CRNA TOPICAL ANESTHETIC: Cetacaine Spray  DESCRIPTION OF PROCEDURE: After the risks benefits and alternatives of the procedure were thoroughly explained, informed consent was obtained.  The Pentax Gastroscope [id number] endoscope was introduced through the mouth and advanced to the stomach antrum. Without limitations.  The instrument was slowly withdrawn as the mucosa was fully examined.    ESOPHAGUS: There were 3 columns of varices in the distal and middle third of the esophagus.  The varices were not actively bleeding. They were flat and did not need banding.  STOMACH: Moderate portal hypertensive gastropathy was found.  The remainder of the upper endoscopy exam was otherwise normal. Retroflexed views revealed as above.     The scope was then withdrawn from the patient and the procedure completed.  COMPLICATIONS: There were no complications. ENDOSCOPIC IMPRESSION: 1.   There were 3 columns of small varices in the distal and middle third of the esophagus; The varices were flat. Banding not needed. 2.   Portal hypertensive gastropathy was found 3.   The remainder of the upper endoscopy exam was otherwise normal exam to stomach only.  RECOMMENDATIONS: Call for office visit to be seen in June 2015  REPEAT EXAM: In 1 year(s)  for EGD . At hospital.  eSigned:  Gatha Mayer, MD, [facility] 09/04/2013 9:55 AM   KG:[id number] Burnett Sheng, MD and The Patient

## 2013-09-04 NOTE — H&P (View-Only) (Signed)
Subjective:    Patient ID: [name], male    DOB: Dec 03, 1965, 48 y.o.   MRN: [id number]  HPI  [name] is a 48 year old white male well known to Dr. Carlean Purl with history of decompensated alcoholic cirrhosis. Patient is abstinent. He has history of esophageal varices and prior bleed, SBP, chronic GERD, HIV, depression, peripheral neuropathy, and hepatic encephalopathy. Comes back in today for followup of hepatic encephalopathy. He had been seen by Dr. Carlean Purl on February 23 and at that time had a venous ammonia level drawn which was significantly elevated at 146. Patient had been using Chronulac but uncertain as to compliance with dosing. He had Xifaxan added to his regimen 550 twice daily since that time.  Patient says that he feels fine currently he feels that he's mentating normally, he has not had any clumsiness or excessive sleepiness. He states he is taking the Xifaxan twice daily and taking Chronulac 3-4 times daily. He is having 2-3 bowel movements per day. He has no specific complaints. Last EGD was done in November of 2014 and underwent variceal band ligation x3 also noted to have portal hypertensive gastropathy. He needs a followup EGD with banding if indicated. Patient has been to see a Psychiatric nurse at [facility] regarding his gynecomastia which he finds quite distressing and says that he is trying to get scheduled to have a breast reduction procedure done. He is under the impression that he had been cleared from Dr. Celesta Aver standpoint.    Review of Systems  Constitutional: Negative.   HENT: Negative.   Eyes: Negative.   Respiratory: Negative.   Cardiovascular: Negative.   Gastrointestinal: Positive for abdominal distention.  Endocrine: Negative.   Genitourinary: Negative.   Musculoskeletal: Negative.   Allergic/Immunologic: Negative.   Neurological: Negative.   Hematological: Negative.   Psychiatric/Behavioral: Negative.    Outpatient  Prescriptions Prior to Visit  Medication Sig Dispense Refill  . acyclovir (ZOVIRAX) 400 MG tablet Take 400 mg by mouth 2 (two) times daily as needed (cold sores).       . ALPRAZolam (XANAX) 1 MG tablet Take 1 tablet (1 mg total) by mouth 2 (two) times daily.  60 tablet  3  . aMILoride (MIDAMOR) 5 MG tablet Take 10 mg by mouth every morning.       Marland Kitchen amitriptyline (ELAVIL) 50 MG tablet Take 50 mg by mouth at bedtime.      . B Complex-C (B-COMPLEX WITH VITAMIN C) tablet Take 1 tablet by mouth daily.      . dolutegravir (TIVICAY) 50 MG tablet Take 1 tablet (50 mg total) by mouth daily.  30 tablet  11  . emtricitabine-tenofovir (TRUVADA) 200-300 MG per tablet Take 1 tablet by mouth daily.  30 tablet  11  . esomeprazole (NEXIUM) 40 MG capsule Take 1 capsule (40 mg total) by mouth daily before breakfast.  30 capsule  11  . folic acid (FOLVITE) 1 MG tablet Take 1 tablet (1 mg total) by mouth daily.  30 tablet  4  . furosemide (LASIX) 40 MG tablet Take 40 mg by mouth 2 (two) times daily.      [name] Glycol-Propyl Glycol (SYSTANE OP) Apply 1 drop to eye as needed (dry eyes).      . potassium chloride SA (K-DUR,KLOR-CON) 20 MEQ tablet Take 20 mEq by mouth 2 (two) times daily.      Marland Kitchen thiamine (VITAMIN B-1) 50 MG tablet Take 50 mg by mouth daily.      Marland Kitchen  lactulose (CHRONULAC) 10 GM/15ML solution Take 30 mLs (20 g total) by mouth 4 (four) times daily.  4266 mL  6   No facility-administered medications prior to visit.   Allergies  Allergen Reactions  . Penicillins     REACTION: hives   Patient Active Problem List   Diagnosis Date Noted  . Orthostatic hypotension 05/28/2013  . Personal history of failed moderate sedation- MUST HAVE MAC OR GENERAL 05/01/2013  . Portal hypertensive gastropathy 01/02/2013  . Iron deficiency anemia 08/18/2011  . Acquired pancytopenia 05/19/2011  . Insomnia 12/21/2010  . Esophageal varices with prior bleeding 07/15/2010  . Alcoholic cirrhosis of liver 07/15/2010  .  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 HIV DISEASE 06/23/2006  . HYPERLIPIDEMIA 06/23/2006  . ANXIETY 06/23/2006  . DEPRESSION 06/23/2006  . HYPERTENSION 06/23/2006  . Reflux esophagitis 06/23/2006       History  Substance Use Topics  . Smoking status: Current Every Day Smoker -- 0.10 packs/day for 10 years    Types: Cigarettes  . Smokeless tobacco: Never Used     Comment: form given 05-19-11  . Alcohol Use: No     Comment: pt is an alcoholic currently in [place] meetings   family history includes Alcohol abuse in his other; Breast cancer in his maternal aunt and mother; Drug abuse in his brother; Heart disease in his brother, maternal uncle, and mother; Hyperlipidemia in his mother; Hypertension in his father and mother; Irritable bowel syndrome in his father and paternal aunt. There is no history of Colon cancer.  Objective:   Physical Exam white male in no acute distress, pleasant- mentation appropriate. Blood pressure 120/74 pulse 76 height 5 foot 10 weight 272. HEENT;nontraumatic cephalic EOMI PERRLA sclera anicteric, Supple; no JVD, Cardiovascular; regular rate and rhythm with S1-S2 no murmur or gallop, Pulm; clear bilaterally, Abdomen ;large soft nontender no appreciable fluid wave no palpable mass or hepatosplenomegaly, Extremities; no clubbing cyanosis or edema, Neuro ;psych mood and affect appropriate- no asterixis        Assessment & Plan:  #1  48 yo male with decompensated alcohol-induced cirrhosis, patient is abstinent #2 hepatic encephalopathy-recent significant elevation venous ammonia level. Currently symptomatically improved with addition of Xifaxan. Patient also mentions that he had been on an Atkins diet and eating a lot of protein at that time those levels were drawn. #3 history of esophageal varices last banding  November 2014 due for followup #4 HIV disease-on treatment #5/weight gain and gynecomastia-patient had been on a Atripla which has been stopped. Obviously gynecomastia can be associated with cirrhosis as well. Patient is focused on having a surgical procedure for reduction but is at high risk for complications with surgery and anesthesia given decompensated cirrhosis and is advised against having an elective procedure done #6 anxiety/depression.  Plan; continue Chronulac 30 cc 4 times daily Continue Xifaxan 550 twice daily Schedule for upper endoscopy with esophageal variceal banding with Dr. Carlean Purl for followup Repeat venous ammonia ,INR today and BMET Discussion with patient regarding continued exercise and carbon modified diet and advised against consuming more than 60 g of protein per day

## 2013-09-05 ENCOUNTER — Ambulatory Visit (INDEPENDENT_AMBULATORY_CARE_PROVIDER_SITE_OTHER): Payer: Medicaid Other | Admitting: Infectious Disease

## 2013-09-05 ENCOUNTER — Encounter (HOSPITAL_COMMUNITY): Payer: Self-pay | Admitting: Internal Medicine

## 2013-09-05 VITALS — BP 122/75 | HR 75 | Temp 97.5°F | Wt 272.0 lb

## 2013-09-05 DIAGNOSIS — B20 Human immunodeficiency virus [HIV] disease: Secondary | ICD-10-CM

## 2013-09-05 DIAGNOSIS — I8500 Esophageal varices without bleeding: Secondary | ICD-10-CM

## 2013-09-05 DIAGNOSIS — F4323 Adjustment disorder with mixed anxiety and depressed mood: Secondary | ICD-10-CM

## 2013-09-05 DIAGNOSIS — K7030 Alcoholic cirrhosis of liver without ascites: Secondary | ICD-10-CM

## 2013-09-05 DIAGNOSIS — N62 Hypertrophy of breast: Secondary | ICD-10-CM

## 2013-09-05 NOTE — Progress Notes (Signed)
  Subjective:    Patient ID: [name], male    DOB: [birth year], 48 y.o.   MRN: [id number]  HPI   [name] is a 48 y.o. male with HIV infection, cirrhosis,  who had been  doing superbly well on his antiviral regimen ofTivicay and Truvada and his VL remains suppressed and CD4 is above 200.  He had EGD done a few days ago by Dr Carlean Purl who found his varices to be stable .  He  Is being evaluated by Plastic Surgeon in [facility] for cosmetic surgery.    Review of Systems  Constitutional: Negative for fever, chills, diaphoresis and unexpected weight change.  HENT: Negative for congestion, rhinorrhea, sinus pressure, sneezing, sore throat and trouble swallowing.   Eyes: Negative for photophobia and visual disturbance.  Respiratory: Negative for cough, chest tightness, shortness of breath, wheezing and stridor.   Cardiovascular: Negative for chest pain, palpitations and leg swelling.  Gastrointestinal: Negative for nausea, vomiting, abdominal pain, diarrhea, constipation, blood in stool, abdominal distention and anal bleeding.  Genitourinary: Negative for dysuria, hematuria, flank pain and difficulty urinating.  Musculoskeletal: Negative for arthralgias, back pain, gait problem, joint swelling and myalgias.  Skin: Negative for color change, pallor, rash and wound.  Neurological: Negative for dizziness, tremors, seizures, weakness and light-headedness.  Hematological: Negative for adenopathy. Does not bruise/bleed easily.  Psychiatric/Behavioral: Negative for behavioral problems, confusion, sleep disturbance, dysphoric mood, decreased concentration and agitation. The patient is not nervous/anxious.        Objective:   Physical Exam  Constitutional: He is oriented to person, place, and time. He appears well-developed and well-nourished. No distress.  HENT:  Head: Normocephalic and atraumatic.  Mouth/Throat: Oropharynx is clear and moist. No oropharyngeal exudate.  Eyes:  Conjunctivae and EOM are normal. Pupils are equal, round, and reactive to light. No scleral icterus.  Neck: Normal range of motion. Neck supple. No JVD present.  Cardiovascular: Normal rate, regular rhythm and normal heart sounds.  Exam reveals no gallop and no friction rub.   No murmur heard. Pulmonary/Chest: Effort normal and breath sounds normal. No respiratory distress. He has no wheezes. He has no rales. He exhibits no tenderness.  Abdominal: Soft. He exhibits distension. He exhibits no mass. There is no tenderness. There is no rebound and no guarding.  Musculoskeletal: He exhibits no edema and no tenderness.  Lymphadenopathy:    He has no cervical adenopathy.  Neurological: He is alert and oriented to person, place, and time. No sensory deficit. He exhibits normal muscle tone. Gait normal.  Skin: Skin is warm and dry. He is not diaphoretic. No erythema. No pallor.  Psychiatric: He has a normal mood and affect. His behavior is normal. Judgment and thought content normal.          Assessment & Plan:   HIV: continue  TIVICAY and Truvada.   I spent greater than 25 minutes with the patient including greater than 50% of time in face to face counsel of the patient and in coordination of their care.   Etoh induced cirrhosis with varices: followed by [facility]  Anxiety depression: care with benzos given his liver disease.  Gynecomastia: Hopefully this may improve off of Sustiva though it could also be due to his cirrhosis he is going to Plastics [place]  Lipodystrophy changes and morbid obesity : continue exercise but avoid high protein diets.

## 2013-09-05 NOTE — Anesthesia Postprocedure Evaluation (Signed)
  Anesthesia Post-op Note  Patient: [name]  Procedure(s) Performed: Procedure(s) (LRB): ESOPHAGOGASTRODUODENOSCOPY (EGD) (N/A)  Patient Location: PACU  Anesthesia Type: MAC  Level of Consciousness: awake and alert   Airway and Oxygen Therapy: Patient Spontanous Breathing  Post-op Pain: mild  Post-op Assessment: Post-op Vital signs reviewed, Patient's Cardiovascular Status Stable, Respiratory Function Stable, Patent Airway and No signs of Nausea or vomiting  Last Vitals:  Filed Vitals:   09/04/13 1025  BP: 132/85  Pulse:   Temp:   Resp: 15    Post-op Vital Signs: stable   Complications: No apparent anesthesia complications

## 2013-09-06 ENCOUNTER — Other Ambulatory Visit: Payer: Self-pay

## 2013-09-06 MED ORDER — LACTULOSE 10 GM/15ML PO SOLN: ORAL | Status: DC

## 2013-09-18 ENCOUNTER — Other Ambulatory Visit: Payer: Self-pay | Admitting: Internal Medicine

## 2013-09-22 ENCOUNTER — Other Ambulatory Visit: Payer: Self-pay | Admitting: Internal Medicine

## 2013-09-24 NOTE — Telephone Encounter (Signed)
Please advise [name] if ok to refill, thank you.

## 2013-10-18 ENCOUNTER — Other Ambulatory Visit: Payer: Self-pay | Admitting: Internal Medicine

## 2013-10-18 NOTE — Telephone Encounter (Signed)
Advise about refill please [name].

## 2013-10-18 NOTE — Telephone Encounter (Signed)
Refill x 1 year 

## 2013-10-19 ENCOUNTER — Other Ambulatory Visit: Payer: Self-pay | Admitting: Infectious Disease

## 2013-10-22 ENCOUNTER — Telehealth: Payer: Self-pay | Admitting: *Deleted

## 2013-10-22 NOTE — Telephone Encounter (Signed)
Pt called requesting a referral to an endocrinologist at the request of [facility] for clearance for his upcoming surgery.  Pt has [employer], needs to seek the referral from his PCP.  Pt in agreement. Landis Gandy, RN

## 2013-10-24 ENCOUNTER — Other Ambulatory Visit: Payer: Self-pay | Admitting: Internal Medicine

## 2013-10-24 NOTE — Telephone Encounter (Signed)
Please advise regarding refill [name]?  Thank you.

## 2013-10-25 NOTE — Telephone Encounter (Signed)
Refill x 6 please

## 2013-10-31 ENCOUNTER — Other Ambulatory Visit: Payer: Self-pay | Admitting: *Deleted

## 2013-10-31 DIAGNOSIS — G4700 Insomnia, unspecified: Secondary | ICD-10-CM

## 2013-10-31 MED ORDER — AMITRIPTYLINE HCL 100 MG PO TABS: 100.0000 mg | ORAL_TABLET | Freq: Every day | ORAL | Status: DC

## 2013-11-11 ENCOUNTER — Other Ambulatory Visit: Payer: Self-pay | Admitting: Infectious Disease

## 2013-11-12 ENCOUNTER — Telehealth: Payer: Self-pay | Admitting: *Deleted

## 2013-11-12 NOTE — Telephone Encounter (Signed)
MD, Please advise about alprazolam rx refill.

## 2013-11-14 ENCOUNTER — Telehealth: Payer: Self-pay | Admitting: Internal Medicine

## 2013-11-14 NOTE — Telephone Encounter (Signed)
Mother called to report that [name] is having trouble with encephalopathy.  Mom reports confusion, shaking, unsteady gait, large abdomen.  She reports symptoms have been present for approximately 1-2 weeks.  She reports he feels he is fine.  She states that he is still taking all of his medications.  I have given him an appt for tomorrow am with Nicoletta Ba PA .  Mom wants me to call him and tell him he needs to come in and not let him know she has called.  Ok to wait until tomorrow or does he need ER today ( I don't have any openings today for APP)

## 2013-11-14 NOTE — Telephone Encounter (Signed)
Give him an appointment for tomorrow - if he deteriorates she should bring him to ED

## 2013-11-14 NOTE — Telephone Encounter (Signed)
OK to fill. He will need this ultiamtely to be taken over by a PCP or psychatrist

## 2013-11-14 NOTE — Telephone Encounter (Signed)
Mother notified.  I have offered to speak with the patient.  She will states she will talk to him.

## 2013-11-15 ENCOUNTER — Encounter: Payer: Self-pay | Admitting: Physician Assistant

## 2013-11-15 ENCOUNTER — Ambulatory Visit (INDEPENDENT_AMBULATORY_CARE_PROVIDER_SITE_OTHER): Payer: Medicaid Other

## 2013-11-15 ENCOUNTER — Ambulatory Visit (INDEPENDENT_AMBULATORY_CARE_PROVIDER_SITE_OTHER): Payer: Medicaid Other | Admitting: Physician Assistant

## 2013-11-15 ENCOUNTER — Other Ambulatory Visit: Payer: Self-pay

## 2013-11-15 VITALS — BP 108/70 | HR 81 | Ht 70.0 in | Wt 277.0 lb

## 2013-11-15 DIAGNOSIS — K7290 Hepatic failure, unspecified without coma: Secondary | ICD-10-CM

## 2013-11-15 DIAGNOSIS — K7030 Alcoholic cirrhosis of liver without ascites: Secondary | ICD-10-CM

## 2013-11-15 DIAGNOSIS — K7682 Hepatic encephalopathy: Secondary | ICD-10-CM

## 2013-11-15 LAB — PROTIME-INR
INR: 1.2 ratio — ABNORMAL HIGH (ref 0.8–1.0)
Prothrombin Time: 13.4 s — ABNORMAL HIGH (ref 9.6–13.1)

## 2013-11-15 LAB — CBC WITH DIFFERENTIAL/PLATELET
Basophils Absolute: 0 10*3/uL (ref 0.0–0.1)
Basophils Relative: 0.2 % (ref 0.0–3.0)
Eosinophils Absolute: 0.1 10*3/uL (ref 0.0–0.7)
Eosinophils Relative: 3.3 % (ref 0.0–5.0)
HCT: 39.9 % (ref 39.0–52.0)
Hemoglobin: 13 g/dL (ref 13.0–17.0)
LYMPHS ABS: 0.9 10*3/uL (ref 0.7–4.0)
Lymphocytes Relative: 29.1 % (ref 12.0–46.0)
MCHC: 32.7 g/dL (ref 30.0–36.0)
MCV: 82.6 fl (ref 78.0–100.0)
Monocytes Absolute: 0.3 10*3/uL (ref 0.1–1.0)
Monocytes Relative: 9.2 % (ref 3.0–12.0)
NEUTROS PCT: 58.2 % (ref 43.0–77.0)
Neutro Abs: 1.8 10*3/uL (ref 1.4–7.7)
PLATELETS: 77 10*3/uL — AB (ref 150.0–400.0)
RBC: 4.83 Mil/uL (ref 4.22–5.81)
RDW: 21.6 % — ABNORMAL HIGH (ref 11.5–15.5)
WBC: 3 10*3/uL — ABNORMAL LOW (ref 4.0–10.5)

## 2013-11-15 LAB — COMPREHENSIVE METABOLIC PANEL
ALBUMIN: 3.8 g/dL (ref 3.5–5.2)
ALT: 32 U/L (ref 0–53)
AST: 41 U/L — AB (ref 0–37)
Alkaline Phosphatase: 112 U/L (ref 39–117)
BILIRUBIN TOTAL: 1.4 mg/dL — AB (ref 0.2–1.2)
BUN: 11 mg/dL (ref 6–23)
CO2: 25 mEq/L (ref 19–32)
Calcium: 9.3 mg/dL (ref 8.4–10.5)
Chloride: 107 mEq/L (ref 96–112)
Creatinine, Ser: 0.8 mg/dL (ref 0.4–1.5)
GFR: 110.79 mL/min (ref >60.00)
Glucose, Bld: 125 mg/dL — ABNORMAL HIGH (ref 70–99)
POTASSIUM: 3.8 meq/L (ref 3.5–5.1)
SODIUM: 139 meq/L (ref 135–145)
Total Protein: 6.5 g/dL (ref 6.0–8.3)

## 2013-11-15 LAB — AMMONIA: Ammonia: 143 umol/L — ABNORMAL HIGH (ref 11–35)

## 2013-11-15 MED ORDER — ALPRAZOLAM 1 MG PO TABS: ORAL_TABLET | ORAL | Status: DC

## 2013-11-15 NOTE — Telephone Encounter (Signed)
Dr Celesta Aver office refilled this for the pt.

## 2013-11-15 NOTE — Patient Instructions (Addendum)
Please go to the basement level to have your labs drawn.  Double your dose of Chronulac you have been taking. Continue the Xifaxan  Take 1 tablet daily of the Xanax.  Avoid extra protein at this time. Dr Carlean Purl is looking into transplant options.

## 2013-11-15 NOTE — Progress Notes (Signed)
Subjective:    Patient ID: [name], male    DOB: 1965-10-29, 48 y.o.   MRN: [id number]  HPI  Sahid is a 48 year old white male well known to Dr. Carlean Purl who has been followed for decompensated alcohol-induced cirrhosis. He has been abstinent over the past 4-5 years. Patient has history of esophageal varices last banding done November 2014 for followup. He has been having problems with weight gain felt secondary to HIV meds, and has been focused on having his gynecomastia surgically corrected. He has seen a Psychiatric nurse at [facility] despite our advice that he should not have any elective surgery secondary to his decompensated cirrhosis. He has been focused on his body image. Patient has had worsening problems with encephalopathy over the past few months and comes in today with his mother because she is concerned about some slurring of his speech and recent falls. He is currently on Xifaxan 550 twice daily and he and his mother both status been taking her medication regularly he is also on Chronulac 4 times daily. It sounds like he is taking about 2 tablespoons at a time. He has 3-4 bowel movements per day. Patient himself is unaware of any problems with slurred speech today but his mother says he's been a bit more sleepy during the day and has fallen a couple of times because his balance is off over the past few weeks. Patient has chronically been on Xanax 1 mg 2-3 times daily. He had cut back to twice daily and is now out of the Xanax that is insistent that he still needs this. Last labs done in March 2015 venous ammonia was 103 INR 1.3.     Review of Systems  Constitutional: Positive for fatigue.  HENT: Negative.   Eyes: Negative.   Respiratory: Negative.   Cardiovascular: Negative.   Gastrointestinal: Positive for abdominal distention.  Endocrine: Negative.   Genitourinary: Negative.   Musculoskeletal: Positive for back pain.  Skin: Negative.   Allergic/Immunologic: Negative.     Neurological: Positive for speech difficulty.  Hematological: Negative.   Psychiatric/Behavioral: The patient is nervous/anxious.    Outpatient Prescriptions Prior to Visit  Medication Sig Dispense Refill  . acyclovir (ZOVIRAX) 400 MG tablet Take 400 mg by mouth 2 (two) times daily as needed (cold sores).       Marland Kitchen aMILoride (MIDAMOR) 5 MG tablet Take 10 mg by mouth every morning.       Marland Kitchen amitriptyline (ELAVIL) 100 MG tablet Take 1 tablet (100 mg total) by mouth at bedtime.  30 tablet  3  . B Complex-C (B-COMPLEX WITH VITAMIN C) tablet Take 1 tablet by mouth daily.      . dolutegravir (TIVICAY) 50 MG tablet Take 1 tablet (50 mg total) by mouth daily.  30 tablet  11  . emtricitabine-tenofovir (TRUVADA) 200-300 MG per tablet Take 1 tablet by mouth daily.  30 tablet  11  . esomeprazole (NEXIUM) 40 MG capsule Take 1 capsule (40 mg total) by mouth daily before breakfast.  30 capsule  11  . furosemide (LASIX) 40 MG tablet TAKE 1 TABLET BY MOUTH TWICE DAILY  60 tablet  0  . lactulose (CHRONULAC) 10 GM/15ML solution Take 30 cc ( ML) 3-4 times daily.  3600 mL  3  . Polyethyl Glycol-Propyl Glycol (SYSTANE OP) Apply 1 drop to eye as needed (dry eyes).      . thiamine (VITAMIN B-1) 50 MG tablet Take 50 mg by mouth daily.      Marland Kitchen  XIFAXAN 550 MG TABS tablet TAKE 1 TABLET BY MOUTH TWICE DAILY  60 tablet  11  . ALPRAZolam (XANAX) 1 MG tablet TAKE 1 TABLET BY MOUTH TWICE DAILY  60 tablet  0  . potassium chloride SA (K-DUR,KLOR-CON) 20 MEQ tablet TAKE 1 TABLET BY MOUTH EVERY DAY  30 tablet  5  . folic acid (FOLVITE) 1 MG tablet Take 1 tablet (1 mg total) by mouth daily.  30 tablet  4  . furosemide (LASIX) 40 MG tablet Take 40 mg by mouth 2 (two) times daily.      . furosemide (LASIX) 40 MG tablet TAKE 1 TABLET BY MOUTH TWICE DAILY  60 tablet  0  . potassium chloride SA (K-DUR,KLOR-CON) 20 MEQ tablet Take 20 mEq by mouth 2 (two) times daily.       No facility-administered medications prior to visit.      Allergies  Allergen Reactions  . Penicillins     REACTION: hives   Patient Active Problem List   Diagnosis Date Noted  . Orthostatic hypotension 05/28/2013  . Personal history of failed moderate sedation- MUST HAVE MAC OR GENERAL 05/01/2013  . Portal hypertensive gastropathy 01/02/2013  . Iron deficiency anemia 08/18/2011  . Acquired pancytopenia 05/19/2011  . Insomnia 12/21/2010  . Esophageal varices with prior bleeding 07/15/2010  . Alcoholic cirrhosis of liver 07/15/2010  . 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 HIV DISEASE 06/23/2006  . HYPERLIPIDEMIA 06/23/2006  . ANXIETY 06/23/2006  . DEPRESSION 06/23/2006  . HYPERTENSION 06/23/2006  . Reflux esophagitis 06/23/2006   History  Substance Use Topics  . Smoking status: Current Every Day Smoker -- 0.10 packs/day for 10 years    Types: Cigarettes  . Smokeless tobacco: Never Used     Comment: form given 05-19-11  . Alcohol Use: No     Comment: pt is an alcoholic currently in [place] meetings      family history includes Alcohol abuse in his other; Breast cancer in his maternal aunt and mother; Drug abuse in his brother; Heart disease in his brother, maternal uncle, and mother; Hyperlipidemia in his mother; Hypertension in his father and mother; Irritable bowel syndrome in his father and paternal aunt. There is no history of Colon cancer.  Objective:   Physical Exam  well-developed white male in no acute distress accompanied by his mother blood pressure 108/70 pulse 80 one height 5 foot 10 weight 277 this is up 5 pounds since last office visit. HEENT; nontraumatic normocephalic EOMI PERRLA sclera anicteric, Supple; no JVD, Cardiovascular ;regular rate and rhythm with S1-S2 no murmur or gallop, Pulmonary ;clear bilaterally, Abdomen; large but no fluid wave soft bowel sounds are  active no palpable mass or hepatosplenomegaly, Rectal; exam not done, Extremities; no clubbing cyanosis or edema skin warm and dry, Neuropsych ;speech is mildly slurred and a bit slow movements are slowed positive asterixis        Assessment & Plan:  #67 48 year old male with decompensated alcoholic liver disease complicated by peripheral edema, esophageal varices, coagulopathy, and now with somewhat refractory hepatic encephalopathy. Presents today with some slurring of speech and intermittent falls Compliant with meds #2 HIV-on treatment-most recent CD4 count March 2015 is 240, HIV RNA quant 23 #3 chronic anxiety/depression  Plan; Repeat labs today to include venous ammonia, CBC, CMET, pro time, alpha-fetoprotein level, urine drug screen and EtOH level Advised patient again not to have  any elective surgery including breast reduction for his gynecomastia Case was discussed with Dr. Carlean Purl and Dr. Hilarie Fredrickson and have learned that liver transplant has been approved at [facility] for patients with HIV if CD4 count greater than 100 and viral load undetectable. We will make patient a referral to [facility] for the transplant evaluation however currently his viral load is not undetectable Continue Xifaxan 550 twice daily Increase Chronulac 60 cc 4 times daily Have refill Xanax but told him he should be tapered off of this and have asked him to take no more than one daily for now Followup with Dr. Carlean Purl in 3-4 weeks

## 2013-11-16 ENCOUNTER — Telehealth: Payer: Self-pay | Admitting: *Deleted

## 2013-11-16 LAB — ETHANOL: Alcohol, Ethyl (B): <10 mg/dL (ref 0–10)

## 2013-11-16 LAB — AFP TUMOR MARKER: AFP TUMOR MARKER: 1.5 ng/mL (ref 0.0–8.0)

## 2013-11-16 NOTE — Telephone Encounter (Signed)
Patient called to check and see if we could call in a refill for his xanax. His speech was slurred and he was very confused. Advised the patient that per his chart he received a Rx from [facility] and we can not issue him a Rx at this time. Advised him to call that office if he has any questions. He did not seem to understand and started rambling on I was not able to understand him so again I directed him to the other office.

## 2013-11-16 NOTE — Telephone Encounter (Signed)
I called [facility] in [place], [place] at [phone].  I verified with the pharmacist that the fax number, [phone] was correct. I faxed it yesterday on 11-15-2013.  They didn't get it.  So I refaxed it today 6-5 at 3:40 PM.  The pharmacist said it hadn't come through yet but it takes a little time.  I called this patient and left him a message with the above information and told him to call the pharmacy before he goes to pick it up.

## 2013-11-16 NOTE — Telephone Encounter (Signed)
Message copied by Tonette Bihari on Fri Nov 16, 2013  3:50 PM ------      Message from: Darden Dates      Created: Fri Nov 16, 2013 12:31 PM       Pt called to verify that rx for Xanax was sent in.  Says pharmacy says it was not received.  Looks like it printed in the system.  [facility] [place] Please let him know [phone]            Thanks      Amy ------

## 2013-11-16 NOTE — Telephone Encounter (Signed)
I left a second message for this patient to advise I just spoke to the pharmacy at [facility], phone # is [phone], and they got our fax and they are filling the prescription. The time I called him was 4:05 PM. I called the patient on his mobile number.

## 2013-11-16 NOTE — Telephone Encounter (Signed)
I am worried he is encephalopathic again, he may need to go to ED

## 2013-11-19 ENCOUNTER — Other Ambulatory Visit: Payer: Medicaid Other

## 2013-11-19 ENCOUNTER — Telehealth: Payer: Self-pay | Admitting: Internal Medicine

## 2013-11-19 DIAGNOSIS — K7290 Hepatic failure, unspecified without coma: Secondary | ICD-10-CM

## 2013-11-19 DIAGNOSIS — K7030 Alcoholic cirrhosis of liver without ascites: Secondary | ICD-10-CM

## 2013-11-19 DIAGNOSIS — K7682 Hepatic encephalopathy: Secondary | ICD-10-CM

## 2013-11-19 NOTE — Telephone Encounter (Signed)
Discussed with Dr. Carlean Purl.  He wants to speak with Dr. Wendie Agreste prior to referral and he asked that I let Latroy know he is going to work on it.  Patient notified

## 2013-11-19 NOTE — Telephone Encounter (Signed)
Patient called back to the office and sounds better and all he is concerned with is his Xanax refill. Which is has gotten from GI office.

## 2013-11-20 LAB — DRUG SCREEN, URINE
AMPHETAMINE SCRN UR: NEGATIVE
Barbiturate Quant, Ur: NEGATIVE
Benzodiazepines.: POSITIVE — AB
CREATININE, U: 57.76 mg/dL
Cocaine Metabolites: NEGATIVE
Marijuana Metabolite: NEGATIVE
Methadone: NEGATIVE
Opiates: NEGATIVE
PHENCYCLIDINE (PCP): NEGATIVE
Propoxyphene: NEGATIVE

## 2013-11-21 ENCOUNTER — Telehealth: Payer: Self-pay | Admitting: Internal Medicine

## 2013-11-21 MED ORDER — LACTULOSE 10 GM/15ML PO SOLN: ORAL | Status: DC

## 2013-11-21 NOTE — Progress Notes (Signed)
Agree with Ms. Genia Harold assessment and plan. Gatha Mayer, MD, [facility]  I will look into possibility of teriary transplant referral

## 2013-11-21 NOTE — Telephone Encounter (Signed)
Reviewed with his mother dysphagia is only with popcorn.  He ate chocolate last night and she now believes that is what was on his pillow.  She also is requesting a refill on his Chronulac.  They will call back for any additional questions or concerns

## 2013-11-23 ENCOUNTER — Telehealth: Payer: Self-pay | Admitting: Internal Medicine

## 2013-11-23 NOTE — Telephone Encounter (Signed)
FYI

## 2013-11-24 ENCOUNTER — Other Ambulatory Visit: Payer: Self-pay | Admitting: Internal Medicine

## 2013-11-26 NOTE — Telephone Encounter (Signed)
Please advise [name] regarding refill , thank you.

## 2013-11-29 ENCOUNTER — Telehealth: Payer: Self-pay | Admitting: Internal Medicine

## 2013-11-29 ENCOUNTER — Encounter: Payer: Self-pay | Admitting: Internal Medicine

## 2013-11-29 NOTE — Telephone Encounter (Signed)
Patient states he is anxious at night with decreasing the xanax as ordered by Dr. Carlean Purl .   He is taking 1 mg in the am he would like Dr. Carlean Purl to let him have 1 mg BID.  I have asked that he try to take 0.5 BID rather than 1 mg in the am.  He will try this for a week or so and call back with an update.  I also explained that he will receive a call from [facility] about appt.

## 2013-11-29 NOTE — Telephone Encounter (Signed)
Left message for patient to call back  

## 2013-11-30 ENCOUNTER — Telehealth: Payer: Self-pay | Admitting: Internal Medicine

## 2013-11-30 NOTE — Telephone Encounter (Signed)
Spoke with patient and told him his platelet count from last lab value. Stable for him.

## 2013-12-03 ENCOUNTER — Telehealth: Payer: Self-pay | Admitting: Internal Medicine

## 2013-12-03 MED ORDER — ESOMEPRAZOLE MAGNESIUM 40 MG PO CPDR: 40.0000 mg | DELAYED_RELEASE_CAPSULE | Freq: Every day | ORAL | Status: DC

## 2013-12-03 NOTE — Telephone Encounter (Signed)
Patient called about reflux but all he wants to discuss is xanax and Dr. Carlean Purl increasing the dose.  Do you want to increase xanax dose.  He has not been taking nexium.  I will send in a refill of nexium

## 2013-12-04 NOTE — Telephone Encounter (Signed)
Schedule REV me next avail (Aug ok)

## 2013-12-04 NOTE — Telephone Encounter (Signed)
I have left message for the patient to call back 

## 2013-12-05 NOTE — Telephone Encounter (Signed)
Patient notified .  02/05/14 9:45 with Dr. Carlean Purl

## 2013-12-05 NOTE — Telephone Encounter (Signed)
Says he talked to [facility] this morning and forgot to mention he is constipated.

## 2013-12-06 NOTE — Telephone Encounter (Signed)
Left message for patient to call back  

## 2013-12-07 ENCOUNTER — Telehealth: Payer: Self-pay | Admitting: *Deleted

## 2013-12-07 NOTE — Telephone Encounter (Signed)
Pt was seen at [facility].  Unable to do the surgery.  Pt wanted to thank T. Yarborough and Dr. Tommy Medal for setting up the referral and the appointment.

## 2013-12-09 NOTE — Telephone Encounter (Signed)
Thanks Denise! 

## 2013-12-12 ENCOUNTER — Other Ambulatory Visit: Payer: Self-pay

## 2013-12-12 MED ORDER — LACTULOSE 10 GM/15ML PO SOLN: ORAL | Status: DC

## 2013-12-12 NOTE — Telephone Encounter (Signed)
Patient advised of the increase on the chronulac-he would like to ask about a cough he is having-reports it as a little tickle but that he sometmes get so strangled with the cough he has to leave the room-he is specifically asking if he needs an EGD to check the varices-please advise

## 2013-12-12 NOTE — Telephone Encounter (Signed)
Call to patient-he reports he is only having one bowel movement a day-he feels he should have more-he relates it to the decrease in Xanax-patient advised the Xanax will not be increased-patient reports he takes Chronulac 6 doses daily, Miralax once daily and Metamucil once daily-Do you advise any changes in this?

## 2013-12-12 NOTE — Telephone Encounter (Signed)
No egd

## 2013-12-12 NOTE — Telephone Encounter (Signed)
Increase Chronulac to 2 tbsp 4 times a day (8 doses)

## 2013-12-13 NOTE — Telephone Encounter (Signed)
Left detailed message on patients cell phone

## 2013-12-19 ENCOUNTER — Telehealth: Payer: Self-pay | Admitting: Internal Medicine

## 2013-12-20 ENCOUNTER — Other Ambulatory Visit: Payer: Self-pay

## 2013-12-20 DIAGNOSIS — K7030 Alcoholic cirrhosis of liver without ascites: Secondary | ICD-10-CM

## 2013-12-20 DIAGNOSIS — K7031 Alcoholic cirrhosis of liver with ascites: Secondary | ICD-10-CM

## 2013-12-20 NOTE — Telephone Encounter (Signed)
Not sure what he is talking about Lets have him get an abd [place] Reasons and dx are: 1) cirrhosis 2) evaluate for ascites and liver masses

## 2013-12-20 NOTE — Telephone Encounter (Signed)
Refluxing despite Nexium-confirmed he is taking it right-states he is eating the right things, but he continues to have reflux of acid and bitter taste in his mouth-please advise

## 2013-12-20 NOTE — Telephone Encounter (Signed)
Abd u/s 12/24/13 arrive at 7:45am-NPO after midnight-patient advised

## 2013-12-21 NOTE — Telephone Encounter (Signed)
Patient reports that he was scheduled for an appt for [facility] transplant for August 4.  He scheduled the appt with [facility] and is aware of all the details

## 2013-12-24 ENCOUNTER — Ambulatory Visit (HOSPITAL_COMMUNITY): Admission: RE | Admit: 2013-12-24 | Payer: Medicaid Other | Source: Ambulatory Visit

## 2013-12-31 ENCOUNTER — Telehealth: Payer: Self-pay | Admitting: Internal Medicine

## 2013-12-31 NOTE — Telephone Encounter (Signed)
Left message for patient to call back  

## 2014-01-01 NOTE — Telephone Encounter (Signed)
Patient notified that Dr. Carlean Purl does not wish to make any changes to his xanax rx, he can have the pharmacy send in a refill request and Dr. Carlean Purl will decide then if he will refill it.

## 2014-01-03 ENCOUNTER — Telehealth: Payer: Self-pay | Admitting: Internal Medicine

## 2014-01-03 NOTE — Telephone Encounter (Signed)
I spoke with the patient's father.  Patient and mom are out together. He will have [name] come on 01/07/14 3:15 to discuss xanax

## 2014-01-03 NOTE — Telephone Encounter (Signed)
See if you can talk to his mom I should see him next week - somewhere, somehow

## 2014-01-03 NOTE — Telephone Encounter (Signed)
Dr. Carlean Purl I am not sure what to tell him anymore.  He continues to call with the same complaints.  What are we doing with his xanax?  Does he need refills? He believes he is having withdrawal.

## 2014-01-07 ENCOUNTER — Other Ambulatory Visit (INDEPENDENT_AMBULATORY_CARE_PROVIDER_SITE_OTHER): Payer: Medicaid Other

## 2014-01-07 ENCOUNTER — Ambulatory Visit (INDEPENDENT_AMBULATORY_CARE_PROVIDER_SITE_OTHER)
Admission: RE | Admit: 2014-01-07 | Discharge: 2014-01-07 | Disposition: A | Discharge status: Home / Self Care | Payer: Medicaid Other | Source: Ambulatory Visit | Attending: Internal Medicine | Admitting: Internal Medicine

## 2014-01-07 ENCOUNTER — Ambulatory Visit (INDEPENDENT_AMBULATORY_CARE_PROVIDER_SITE_OTHER): Payer: Medicaid Other | Admitting: Internal Medicine

## 2014-01-07 ENCOUNTER — Encounter: Payer: Self-pay | Admitting: Internal Medicine

## 2014-01-07 VITALS — BP 142/80 | HR 80 | Ht 70.0 in | Wt 288.8 lb

## 2014-01-07 DIAGNOSIS — IMO0002 Reserved for concepts with insufficient information to code with codable children: Secondary | ICD-10-CM

## 2014-01-07 DIAGNOSIS — K7682 Hepatic encephalopathy: Secondary | ICD-10-CM | POA: Diagnosis not present

## 2014-01-07 DIAGNOSIS — S6990XA Unspecified injury of unspecified wrist, hand and finger(s), initial encounter: Secondary | ICD-10-CM

## 2014-01-07 DIAGNOSIS — G4700 Insomnia, unspecified: Secondary | ICD-10-CM

## 2014-01-07 DIAGNOSIS — S6992XA Unspecified injury of left wrist, hand and finger(s), initial encounter: Secondary | ICD-10-CM

## 2014-01-07 DIAGNOSIS — W19XXXA Unspecified fall, initial encounter: Secondary | ICD-10-CM

## 2014-01-07 DIAGNOSIS — K7031 Alcoholic cirrhosis of liver with ascites: Secondary | ICD-10-CM

## 2014-01-07 DIAGNOSIS — K7030 Alcoholic cirrhosis of liver without ascites: Secondary | ICD-10-CM | POA: Diagnosis not present

## 2014-01-07 DIAGNOSIS — K7290 Hepatic failure, unspecified without coma: Secondary | ICD-10-CM

## 2014-01-07 DIAGNOSIS — G9009 Other idiopathic peripheral autonomic neuropathy: Secondary | ICD-10-CM

## 2014-01-07 DIAGNOSIS — F411 Generalized anxiety disorder: Secondary | ICD-10-CM

## 2014-01-07 DIAGNOSIS — S62629A Displaced fracture of medial phalanx of unspecified finger, initial encounter for closed fracture: Secondary | ICD-10-CM

## 2014-01-07 HISTORY — DX: Unspecified fall, initial encounter: W19.XXXA

## 2014-01-07 HISTORY — DX: Displaced fracture of middle phalanx of unspecified finger, initial encounter for closed fracture: S62.629A

## 2014-01-07 LAB — TSH: TSH: 0.48 u[IU]/mL (ref 0.35–4.50)

## 2014-01-07 LAB — AMMONIA: AMMONIA: 66 umol/L — AB (ref 11–35)

## 2014-01-07 MED ORDER — ALPRAZOLAM 1 MG PO TABS: 1.0000 mg | ORAL_TABLET | Freq: Two times a day (BID) | ORAL | Status: DC

## 2014-01-07 MED ORDER — AMITRIPTYLINE HCL 100 MG PO TABS: 100.0000 mg | ORAL_TABLET | Freq: Every day | ORAL | Status: DC

## 2014-01-07 NOTE — Assessment & Plan Note (Signed)
Improved off Atkins diet

## 2014-01-07 NOTE — Assessment & Plan Note (Signed)
Ortho referral  

## 2014-01-07 NOTE — Assessment & Plan Note (Addendum)
Stable Awaiting [facility] OLT eval 9/4

## 2014-01-07 NOTE — Progress Notes (Signed)
Subjective:    Patient ID: [name], male    DOB: [birth year], 48 y.o.   MRN: [id number]  HPI [name] is here with his mom with several complaints. The first is that he's been having difficulty with what he thinks is his balance and wobbly when he walks and actually fell in the past few days, going down some steps. He injured his left hand which is swollen and somewhat painful, and he has a few other areas of abrasions, he also struck left knee in abdomen area.Marland Kitchen He did not lose consciousness. He said he felt somewhat dizzy. Almost like vertigo though it's not clear from the history.  He has been anxious, when he was seen in June, he had a high ammonia level, he was encephalopathic and his Xanax reduce to once a day. He has been calling about going back to twice a day which he has been taking that because of anxiety and panic-like symptoms. He is no longer encephalopathic it seems, his mom says though he has some memory issues he is not overtly confused excessively sleepy et Ronney Asters.  He did receive a letter from [facility] about the possibility of liver transplant. He has paperwork to fill out and he has an appointment to be seen.  Intends to work on quitting smoking. He continues in alcoholics anonymous Allergies  Allergen Reactions  . Penicillins     REACTION: hives   Outpatient Prescriptions Prior to Visit  Medication Sig Dispense Refill  . acyclovir (ZOVIRAX) 400 MG tablet Take 400 mg by mouth 2 (two) times daily as needed (cold sores).       Marland Kitchen aMILoride (MIDAMOR) 5 MG tablet Take 10 mg by mouth every morning.       . B Complex-C (B-COMPLEX WITH VITAMIN C) tablet Take 1 tablet by mouth daily.      . dolutegravir (TIVICAY) 50 MG tablet Take 1 tablet (50 mg total) by mouth daily.  30 tablet  11  . emtricitabine-tenofovir (TRUVADA) 200-300 MG per tablet Take 1 tablet by mouth daily.  30 tablet  11  . esomeprazole (NEXIUM) 40 MG capsule Take 1 capsule (40 mg total) by mouth daily before  breakfast.  30 capsule  11  . furosemide (LASIX) 40 MG tablet TAKE 1 TABLET BY MOUTH TWICE DAILY  60 tablet  0  . lactulose (CHRONULAC) 10 GM/15ML solution Take 30 cc ( ML) 3-4 times daily.  3600 mL  3  . Polyethyl Glycol-Propyl Glycol (SYSTANE OP) Apply 1 drop to eye as needed (dry eyes).      . potassium chloride SA (K-DUR,KLOR-CON) 20 MEQ tablet take 1/2 tablet in the morning and 1/2 tablet in the evening      . thiamine (VITAMIN B-1) 50 MG tablet Take 50 mg by mouth daily.      Marland Kitchen XIFAXAN 550 MG TABS tablet TAKE 1 TABLET BY MOUTH TWICE DAILY  60 tablet  11  . ALPRAZolam (XANAX) 1 MG tablet Take 1 tab daily.  30 tablet  1  . amitriptyline (ELAVIL) 100 MG tablet Take 1 tablet (100 mg total) by mouth at bedtime.  30 tablet  3  . furosemide (LASIX) 40 MG tablet TAKE 1 TABLET BY MOUTH TWICE DAILY  60 tablet  5   No facility-administered medications prior to visit.   Past Medical History  Diagnosis Date  . HIV DISEASE 06/23/2006  . HYPERLIPIDEMIA 06/23/2006  . ANXIETY 06/23/2006  . DEPRESSION 06/23/2006  . HYPERTENSION 06/23/2006  . SINUSITIS,  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  . 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 Ascites 11/13/2009  . STRAIN, CHEST WALL 03/15/2007  . 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 Gastric ulcer 06/2010  . Alcoholic cirrhosis of liver   . Alcoholism, chronic   . Iron deficiency anemia   . GERD (gastroesophageal reflux disease)   . SBP (spontaneous bacterial peritonitis) 05/03/2011    Suspected by high leukocytes on paracentesis. Clinical scenario also compatible. November 2012 responded to Levaquin. Started on trimethoprim-sulfamethoxazole double strength daily for prophylaxis.   . Obesity (BMI 30-39.9)     BMI 34 kg/m^2  . Left foot infection   . Ascites   . Portal hypertensive gastropathy 01/02/2013  . Blood dyscrasia     HIV  . Skin cancer     left calf  . 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 Esophagogastroduodenoscopy w/ banding  06/26/2010    variceal ligation  . Carpal tunnel release      left  . Esophagogastroduodenoscopy  07/01/2010;  08/12/10    small varices, gastric ulcer  . Esophagogastroduodenoscopy  10/26/2011    Procedure: ESOPHAGOGASTRODUODENOSCOPY (EGD);  Surgeon: Gatha Mayer, MD;  Location: [facility];  Service: Endoscopy;  Laterality: N/A;  . Upper gastrointestinal endoscopy    . Colonoscopy  march 2013  . Esophagogastroduodenoscopy N/A 01/02/2013    Procedure: ESOPHAGOGASTRODUODENOSCOPY (EGD);  Surgeon: Gatha Mayer, MD;  Location: [facility];  Service: Endoscopy;  Laterality: N/A;  . Gastric varices banding N/A 01/02/2013    Procedure: GASTRIC VARICES BANDING;  Surgeon: Gatha Mayer, MD;  Location: [facility];  Service: Endoscopy;  Laterality: N/A;  possible banding  . Esophagogastroduodenoscopy N/A 04/23/2013    Procedure: ESOPHAGOGASTRODUODENOSCOPY (EGD);  Surgeon: Gatha Mayer, MD;  Location: [facility];  Service: Endoscopy;  Laterality: N/A;  . Esophagogastroduodenoscopy N/A 05/01/2013    Procedure: ESOPHAGOGASTRODUODENOSCOPY (EGD);  Surgeon: Gatha Mayer, MD;  Location: [facility];  Service: Endoscopy;  Laterality: N/A;  follow-up varices and possibly band them  . Esophageal banding  05/01/2013    Procedure: ESOPHAGEAL BANDING;  Surgeon: Gatha Mayer, MD;  Location: [facility];  Service: Endoscopy;;  . Esophagogastroduodenoscopy N/A 09/04/2013    Procedure: ESOPHAGOGASTRODUODENOSCOPY (EGD);  Surgeon: Gatha Mayer, MD;  Location: [facility];  Service: Endoscopy;  Laterality: N/A;    Review of Systems As per history of present illness    Objective:   Physical Exam General:  Obese well-nourished and in no acute distress Eyes:  anicteric. ENT:   Mouth and posterior pharynx free of lesions.  Neck:   supple w/o thyromegaly or mass.  Lungs: Clear to auscultation bilaterally. Heart:    S1S2, no rubs, murmurs, gallops. Abdomen: obese soft, non-tender, no hepatosplenomegaly, hernia, or mass and BS+.   Lymph:  no cervical or supraclavicular adenopathy. Extremities:   Left hand swollen and slightly ecchymotic, mildly tender digitis, neurovascular intact, wrist ok Skin   no rash. - healing ulcers left knee, ecchymosis right lower abdomen Neuro:  A&O x 3. , no asterixis, CN 2-12 intact no nystagmus, negative Romberg, + FNF, no pronator drift - unable to walk heel-toe Psych:  appropriate mood and  Affect.   Data Reviewed: Prior GI notes    Assessment & Plan:  Alcoholic cirrhosis of liver Stable Awaiting [facility] OLT eval 9/4  ENCEPHALOPATHY, HEPATIC Improved off Atkins diet   ANXIETY Alprazolam bid  Falls ? Cerebellar or peripheral neuropathy -  latter likely Will get PT consult  IDIOPATHIC PERIPHERAL AUTONOMIC NEUROPATHY UNSP Could be cause of fall  Avulsion fracture of middle phalanges of  3rd/4th fingers left hand Ortho referral   YT:[id number],POLIDC, MD  Gatha Mayer, MD, [facility]

## 2014-01-07 NOTE — Patient Instructions (Addendum)
You have been given a separate informational sheet regarding your tobacco use, the importance of quitting and local resources to help you quit.  Please go to the x-ray dept in the basement before you leave today for a hand film.  Your physician has requested that you go to the basement for the following lab work before leaving today: TSH, ammonia level  Today you have been given a printed rx for xanax to take to the pharmacy.  This is to be one twice a day.  We have sent the following medications to your pharmacy for you to pick up at your convenience: Generic Elavil  We have put an order in for physical therapy for you today.   I appreciate the opportunity to care for you.

## 2014-01-07 NOTE — Progress Notes (Signed)
Quick Note:  Has volar plate avulsion fractures Doubt serious but needs an ortho appointment please - try [facility] ______

## 2014-01-07 NOTE — Assessment & Plan Note (Signed)
Could be cause of fall

## 2014-01-07 NOTE — Assessment & Plan Note (Signed)
Alprazolam bid

## 2014-01-07 NOTE — Assessment & Plan Note (Signed)
?   Cerebellar or peripheral neuropathy - latter likely Will get PT consult

## 2014-01-08 ENCOUNTER — Telehealth: Payer: Self-pay | Admitting: Internal Medicine

## 2014-01-08 ENCOUNTER — Other Ambulatory Visit: Payer: Self-pay

## 2014-01-08 DIAGNOSIS — G4700 Insomnia, unspecified: Secondary | ICD-10-CM

## 2014-01-08 DIAGNOSIS — S6992XS Unspecified injury of left wrist, hand and finger(s), sequela: Secondary | ICD-10-CM

## 2014-01-08 MED ORDER — AMITRIPTYLINE HCL 100 MG PO TABS: 100.0000 mg | ORAL_TABLET | Freq: Every day | ORAL | Status: DC

## 2014-01-08 NOTE — Telephone Encounter (Signed)
Spoke with patient and told him that pharmacy had already called this AM about the xanax sig.  Gave it to them verbally: 1mg  xanax , one tablet twice a day.  Also Deo said that his generic elavil didn't reach the pharmacy.  When I checked it was marked phone in instead of normal so I re-sent it to the [facility] in [place] as requested.

## 2014-01-10 NOTE — Telephone Encounter (Signed)
Message copied by Greggory Keen on Thu Jan 10, 2014 11:21 AM ------      Message from: Silvano Rusk E      Created: Thu Jan 10, 2014 10:04 AM      Regarding: reschedule [place]       Needs to reschedule [place]      Cancelled 7/9 ------

## 2014-01-10 NOTE — Telephone Encounter (Signed)
Patient agrees to go to [facility] 01/17/14 at 9:15am fasting for the abdominal u/s.

## 2014-01-10 NOTE — Progress Notes (Signed)
Quick Note:  Ammonia better TSH ok Let him know Has he seen ortho? ______

## 2014-01-17 ENCOUNTER — Ambulatory Visit (HOSPITAL_COMMUNITY): Payer: Medicaid Other

## 2014-01-21 ENCOUNTER — Ambulatory Visit (HOSPITAL_COMMUNITY)
Admission: RE | Admit: 2014-01-21 | Discharge: 2014-01-21 | Disposition: A | Discharge status: Home / Self Care | Payer: Medicaid Other | Source: Ambulatory Visit | Attending: Internal Medicine | Admitting: Internal Medicine

## 2014-01-21 DIAGNOSIS — K7031 Alcoholic cirrhosis of liver with ascites: Secondary | ICD-10-CM

## 2014-01-21 DIAGNOSIS — K7460 Unspecified cirrhosis of liver: Secondary | ICD-10-CM | POA: Insufficient documentation

## 2014-01-21 DIAGNOSIS — R161 Splenomegaly, not elsewhere classified: Secondary | ICD-10-CM | POA: Diagnosis not present

## 2014-01-22 NOTE — Progress Notes (Signed)
Quick Note:  [place] stable - cirrhosis, no tumors seen ______

## 2014-01-31 ENCOUNTER — Telehealth: Payer: Self-pay | Admitting: Internal Medicine

## 2014-01-31 ENCOUNTER — Ambulatory Visit: Payer: Medicaid Other | Attending: Internal Medicine | Admitting: Physical Therapy

## 2014-01-31 NOTE — Telephone Encounter (Signed)
Pts mother was concerned because she states the pt is sleeping a lot. Discussed with mother that he needed to be taking the lactulose 3-4 times daily. She states they will do this, pt went to the y today and worked out. Pt has follow-up appt 02/05/14 and will keep this appt.

## 2014-02-05 ENCOUNTER — Telehealth: Payer: Self-pay | Admitting: Internal Medicine

## 2014-02-05 ENCOUNTER — Ambulatory Visit: Payer: Medicaid Other | Admitting: Internal Medicine

## 2014-02-05 NOTE — Telephone Encounter (Signed)
Patient's mom notified that appt prior to [facility] appt is not necessary

## 2014-02-08 ENCOUNTER — Telehealth: Payer: Self-pay | Admitting: Internal Medicine

## 2014-02-08 NOTE — Telephone Encounter (Signed)
Records are in care every where and have sent all records to [facility].  Mom notified

## 2014-02-14 DIAGNOSIS — B20 Human immunodeficiency virus [HIV] disease: Secondary | ICD-10-CM | POA: Insufficient documentation

## 2014-02-25 ENCOUNTER — Telehealth: Payer: Self-pay | Admitting: Internal Medicine

## 2014-02-25 MED ORDER — LACTULOSE 10 GM/15ML PO SOLN: ORAL | Status: DC

## 2014-02-25 NOTE — Telephone Encounter (Signed)
Mom reports that they are having difficulty with getting lactulose.  Mom has had to increase his dosage.  I will send in a larger supply to [facility] in [place]

## 2014-02-26 NOTE — Telephone Encounter (Signed)
I spoke with [employer].  RX was picked up last night according to the pharmacy

## 2014-03-18 ENCOUNTER — Telehealth: Payer: Self-pay | Admitting: Internal Medicine

## 2014-03-18 NOTE — Telephone Encounter (Signed)
We need more info  Is he confused Is he sleepy??  Did another MD recommend a different dose

## 2014-03-18 NOTE — Telephone Encounter (Signed)
Left message to call back  

## 2014-03-18 NOTE — Telephone Encounter (Signed)
Please advise [name], thank you. 

## 2014-03-19 MED ORDER — LACTULOSE 10 GM/15ML PO SOLN: ORAL | Status: DC

## 2014-03-19 NOTE — Telephone Encounter (Signed)
Spoke with mom, Enid Derry, and informed her that we are refilling Stevens lactose.

## 2014-03-19 NOTE — Telephone Encounter (Signed)
OK to rx 2 tbsp (30cc) qid # 1 month supply (please calculate) 5 RF

## 2014-03-19 NOTE — Telephone Encounter (Signed)
Spoke with [name], she reports that [name] not really confused or sleepy, he just can't go to bathroom without multiple doses of his Lactulose 30mg  three-four times a day.  She said he drinks lots of fluids during the day and is having trouble holding his urine at night.  He is on a diet from [facility] that is 800 calorie.  He has an appointment with you October 19th.

## 2014-03-25 ENCOUNTER — Other Ambulatory Visit: Payer: Medicaid Other

## 2014-03-30 ENCOUNTER — Telehealth: Payer: Self-pay | Admitting: Gastroenterology

## 2014-03-30 NOTE — Telephone Encounter (Signed)
C/o cnstipation that was successfully treated with miralax.  Instructed to continue with the same prn

## 2014-04-02 ENCOUNTER — Encounter: Payer: Self-pay | Admitting: Internal Medicine

## 2014-04-02 ENCOUNTER — Ambulatory Visit (INDEPENDENT_AMBULATORY_CARE_PROVIDER_SITE_OTHER): Payer: Medicaid Other | Admitting: Internal Medicine

## 2014-04-02 ENCOUNTER — Other Ambulatory Visit: Payer: Self-pay | Admitting: Licensed Clinical Social Worker

## 2014-04-02 ENCOUNTER — Other Ambulatory Visit (HOSPITAL_COMMUNITY)
Admission: RE | Admit: 2014-04-02 | Discharge: 2014-04-02 | Disposition: A | Discharge status: Home / Self Care | Payer: Medicaid Other | Source: Ambulatory Visit | Attending: Internal Medicine | Admitting: Internal Medicine

## 2014-04-02 ENCOUNTER — Other Ambulatory Visit: Payer: Medicaid Other

## 2014-04-02 VITALS — BP 122/76 | HR 72 | Ht 70.75 in | Wt 287.5 lb

## 2014-04-02 DIAGNOSIS — Z113 Encounter for screening for infections with a predominantly sexual mode of transmission: Secondary | ICD-10-CM | POA: Insufficient documentation

## 2014-04-02 DIAGNOSIS — K7031 Alcoholic cirrhosis of liver with ascites: Secondary | ICD-10-CM

## 2014-04-02 DIAGNOSIS — F411 Generalized anxiety disorder: Secondary | ICD-10-CM

## 2014-04-02 DIAGNOSIS — B20 Human immunodeficiency virus [HIV] disease: Secondary | ICD-10-CM

## 2014-04-02 DIAGNOSIS — W19XXXD Unspecified fall, subsequent encounter: Secondary | ICD-10-CM

## 2014-04-02 DIAGNOSIS — I8500 Esophageal varices without bleeding: Secondary | ICD-10-CM

## 2014-04-02 DIAGNOSIS — K7682 Hepatic encephalopathy: Secondary | ICD-10-CM

## 2014-04-02 DIAGNOSIS — K7290 Hepatic failure, unspecified without coma: Secondary | ICD-10-CM

## 2014-04-02 LAB — COMPLETE METABOLIC PANEL WITH GFR
ALK PHOS: 99 U/L (ref 39–117)
ALT: 27 U/L (ref 0–53)
AST: 35 U/L (ref 0–37)
Albumin: 3.8 g/dL (ref 3.5–5.2)
BUN: 9 mg/dL (ref 6–23)
CALCIUM: 9.1 mg/dL (ref 8.4–10.5)
CO2: 23 mEq/L (ref 19–32)
Chloride: 107 mEq/L (ref 96–112)
Creat: 0.79 mg/dL (ref 0.50–1.35)
GFR, Est African American: >89 mL/min
GFR, Est Non African American: >89 mL/min
Glucose, Bld: 94 mg/dL (ref 70–99)
POTASSIUM: 3.5 meq/L (ref 3.5–5.3)
SODIUM: 141 meq/L (ref 135–145)
TOTAL PROTEIN: 6.1 g/dL (ref 6.0–8.3)
Total Bilirubin: 1.1 mg/dL (ref 0.2–1.2)

## 2014-04-02 LAB — LIPID PANEL
CHOL/HDL RATIO: 2.4 ratio
Cholesterol: 92 mg/dL (ref 0–200)
HDL: 39 mg/dL — ABNORMAL LOW (ref >39)
LDL CALC: 42 mg/dL (ref 0–99)
Triglycerides: 53 mg/dL (ref <150)
VLDL: 11 mg/dL (ref 0–40)

## 2014-04-02 NOTE — Progress Notes (Signed)
Subjective:    Patient ID: [name], male    DOB: 05/09/1966, 48 y.o.   MRN: [id number]  HPI The patient is here for followup of alcoholic cirrhosis, history of esophageal varices with bleeding, hepatic encephalopathy and anxiety.[facility] transplant eval ongoing. Has not heard about listing. He quit smoking. He is attending spinning classes for exercise. He denies any confusion problems. He attended physical therapy but it was only covered twice so he has not continued. He denies recurrent falls related to his peripheral neuropathy and ataxia issues.  Medications, allergies, past medical history, past surgical history, family history and social history are reviewed and updated in the EMR.  Review of Systems As per history of present illness    Objective:   Physical Exam General:  Well-developed, well-nourished and in no acute distress - obese Eyes:  anicteric. Lungs: Clear to auscultation bilaterally. Heart:  S1S2, no rubs, murmurs, gallops. Abdomen:  soft, non-tender, no hepatosplenomegaly, hernia, or mass and BS+.  Lymph:  no cervical or supraclavicular adenopathy. Extremities:   Trace LE edema Skin   no rash.  Neuro:  A&O x 3. No asterixis Psych:  appropriate mood and  Affect.   Data Reviewed: Wt Readings from Last 3 Encounters:  04/02/14 287 lb 8 oz (130.409 kg)  01/07/14 288 lb 12.8 oz (130.999 kg)  11/15/13 277 lb (125.646 kg)   he had labs drawn today through infectious disease.    Assessment & Plan:  Alcoholic cirrhosis of liver Stable overall. Awaiting a decision from [place] regarding transplant. He will end up needing the 13 valent Pneumovax. I will see if he can get that in infectious disease.  ENCEPHALOPATHY, HEPATIC Seems overall clear today No change in tx  Anxiety state Stable on current Tx  Falls Improved Still has peripheral neuropathy Could not maintain PT  Esophageal varices No problems from these presently. Continue surveillance. He has  not been on beta blocker given his history of SBP and ascites problems.   Return to clinic routinely in 4 months.   Current outpatient prescriptions:acyclovir (ZOVIRAX) 400 MG tablet, Take 400 mg by mouth 2 (two) times daily as needed (cold sores). , Disp: , Rfl: ;  ALPRAZolam (XANAX) 1 MG tablet, Take 1 tablet (1 mg total) by mouth 2 (two) times daily., Disp: 60 tablet, Rfl: 3;  aMILoride (MIDAMOR) 5 MG tablet, Take 10 mg by mouth every morning. , Disp: , Rfl:  amitriptyline (ELAVIL) 100 MG tablet, Take 1 tablet (100 mg total) by mouth at bedtime., Disp: 30 tablet, Rfl: 3;  B Complex-C (B-COMPLEX WITH VITAMIN C) tablet, Take 1 tablet by mouth daily., Disp: , Rfl: ;  dolutegravir (TIVICAY) 50 MG tablet, Take 1 tablet (50 mg total) by mouth daily., Disp: 30 tablet, Rfl: 11;  emtricitabine-tenofovir (TRUVADA) 200-300 MG per tablet, Take 1 tablet by mouth daily., Disp: 30 tablet, Rfl: 11 esomeprazole (NEXIUM) 40 MG capsule, Take 1 capsule (40 mg total) by mouth daily before breakfast., Disp: 30 capsule, Rfl: 11;  furosemide (LASIX) 40 MG tablet, TAKE 1 TABLET BY MOUTH TWICE DAILY, Disp: 60 tablet, Rfl: 0;  lactulose (CHRONULAC) 10 GM/15ML solution, Take 30 cc ( ML) 4 times daily., Disp: 3600 mL, Rfl: 4;  Polyethyl Glycol-Propyl Glycol (SYSTANE OP), Apply 1 drop to eye as needed (dry eyes)., Disp: , Rfl:  potassium chloride SA (K-DUR,KLOR-CON) 20 MEQ tablet, take 1/2 tablet in the morning and 1/2 tablet in the evening, Disp: , Rfl: ;  thiamine (VITAMIN B-1) 50 MG tablet,  Take 50 mg by mouth daily., Disp: , Rfl: ;  XIFAXAN 550 MG TABS tablet, TAKE 1 TABLET BY MOUTH TWICE DAILY, Disp: 60 tablet, Rfl: 11  CC: PENNER,PAMELA, MD Alcide Evener , MD

## 2014-04-02 NOTE — Assessment & Plan Note (Signed)
Improved Still has peripheral neuropathy Could not maintain PT

## 2014-04-02 NOTE — Assessment & Plan Note (Addendum)
No problems from these presently. Continue surveillance. He has not been on beta blocker given his history of SBP and ascites problems.

## 2014-04-02 NOTE — Assessment & Plan Note (Addendum)
Stable overall. Awaiting a decision from [place] regarding transplant. He will end up needing the 13 valent Pneumovax. I will see if he can get that in infectious disease.

## 2014-04-02 NOTE — Assessment & Plan Note (Signed)
Seems overall clear today No change in tx

## 2014-04-02 NOTE — Assessment & Plan Note (Signed)
Stable on current Tx

## 2014-04-02 NOTE — Patient Instructions (Addendum)
We will see you in four months, please call us back in January to set up an appointment.   I appreciate the opportunity to care for you.

## 2014-04-03 LAB — CBC WITH DIFFERENTIAL/PLATELET
BASOS ABS: 0 10*3/uL (ref 0.0–0.1)
BASOS PCT: 0 % (ref 0–1)
Eosinophils Absolute: 0.1 10*3/uL (ref 0.0–0.7)
Eosinophils Relative: 5 % (ref 0–5)
HCT: 37.1 % — ABNORMAL LOW (ref 39.0–52.0)
Hemoglobin: 12.3 g/dL — ABNORMAL LOW (ref 13.0–17.0)
Lymphocytes Relative: 33 % (ref 12–46)
Lymphs Abs: 1 10*3/uL (ref 0.7–4.0)
MCH: 27.8 pg (ref 26.0–34.0)
MCHC: 33.2 g/dL (ref 30.0–36.0)
MCV: 83.7 fL (ref 78.0–100.0)
Monocytes Absolute: 0.3 10*3/uL (ref 0.1–1.0)
Monocytes Relative: 10 % (ref 3–12)
NEUTROS ABS: 1.5 10*3/uL — AB (ref 1.7–7.7)
Neutrophils Relative %: 52 % (ref 43–77)
Platelets: 70 10*3/uL — ABNORMAL LOW (ref 150–400)
RBC: 4.43 MIL/uL (ref 4.22–5.81)
RDW: 17.4 % — AB (ref 11.5–15.5)
WBC: 2.9 10*3/uL — ABNORMAL LOW (ref 4.0–10.5)

## 2014-04-03 LAB — HIV-1 RNA QUANT-NO REFLEX-BLD
HIV 1 RNA Quant: 40 copies/mL — ABNORMAL HIGH (ref <20)
HIV-1 RNA QUANT, LOG: 1.6 {Log} — AB (ref <1.30)

## 2014-04-03 LAB — URINE CYTOLOGY ANCILLARY ONLY
CHLAMYDIA, DNA PROBE: NEGATIVE
NEISSERIA GONORRHEA: NEGATIVE

## 2014-04-03 LAB — T-HELPER CELL (CD4) - (RCID CLINIC ONLY)
CD4 % Helper T Cell: 23 % — ABNORMAL LOW (ref 33–55)
CD4 T Cell Abs: 230 /uL — ABNORMAL LOW (ref 400–2700)

## 2014-04-03 LAB — RPR

## 2014-04-04 ENCOUNTER — Ambulatory Visit: Payer: Medicaid Other | Admitting: Infectious Disease

## 2014-04-08 ENCOUNTER — Ambulatory Visit: Payer: Medicaid Other | Admitting: Infectious Disease

## 2014-04-11 ENCOUNTER — Other Ambulatory Visit: Payer: Self-pay

## 2014-04-11 ENCOUNTER — Telehealth: Payer: Self-pay | Admitting: Internal Medicine

## 2014-04-11 DIAGNOSIS — F05 Delirium due to known physiological condition: Secondary | ICD-10-CM

## 2014-04-11 NOTE — Telephone Encounter (Signed)
Agree with OV tomorrow

## 2014-04-11 NOTE — Telephone Encounter (Signed)
Spoke with the mother. There is no transportation for the patient today but she will have him a ride in tomorrow.

## 2014-04-11 NOTE — Telephone Encounter (Signed)
Spoke with the mother. She reports the patient has had an increase of confusion, forgetfulness and difficulty walking (stumbling and falling) over the past 2 weeks. She doubled his Chronulac for the past 2 weeks also, but does not see any improvement. The patient is wetting the bed now. She does not observe any SOB in the patient. He is not combative. Per Dr Hilarie Fredrickson, CBC, Cmet, INR and u/a ordered. Appointment made for evaluation. Message left with this information.

## 2014-04-12 ENCOUNTER — Other Ambulatory Visit (INDEPENDENT_AMBULATORY_CARE_PROVIDER_SITE_OTHER): Payer: Medicaid Other

## 2014-04-12 ENCOUNTER — Ambulatory Visit (INDEPENDENT_AMBULATORY_CARE_PROVIDER_SITE_OTHER)
Admission: RE | Admit: 2014-04-12 | Discharge: 2014-04-12 | Disposition: A | Discharge status: Home / Self Care | Payer: Medicaid Other | Source: Ambulatory Visit | Attending: Physician Assistant | Admitting: Physician Assistant

## 2014-04-12 ENCOUNTER — Encounter: Payer: Self-pay | Admitting: Physician Assistant

## 2014-04-12 ENCOUNTER — Ambulatory Visit (INDEPENDENT_AMBULATORY_CARE_PROVIDER_SITE_OTHER): Payer: Medicaid Other | Admitting: Physician Assistant

## 2014-04-12 ENCOUNTER — Other Ambulatory Visit (HOSPITAL_COMMUNITY): Payer: Self-pay | Admitting: Physician Assistant

## 2014-04-12 VITALS — BP 130/70 | HR 79 | Ht 70.0 in | Wt 291.0 lb

## 2014-04-12 DIAGNOSIS — K7031 Alcoholic cirrhosis of liver with ascites: Secondary | ICD-10-CM

## 2014-04-12 DIAGNOSIS — R296 Repeated falls: Secondary | ICD-10-CM

## 2014-04-12 DIAGNOSIS — G9340 Encephalopathy, unspecified: Secondary | ICD-10-CM

## 2014-04-12 DIAGNOSIS — F05 Delirium due to known physiological condition: Secondary | ICD-10-CM

## 2014-04-12 DIAGNOSIS — R1310 Dysphagia, unspecified: Secondary | ICD-10-CM | POA: Diagnosis not present

## 2014-04-12 DIAGNOSIS — R300 Dysuria: Secondary | ICD-10-CM

## 2014-04-12 LAB — URINALYSIS
Bilirubin Urine: NEGATIVE
HGB URINE DIPSTICK: NEGATIVE
Ketones, ur: NEGATIVE
Leukocytes, UA: NEGATIVE
NITRITE: NEGATIVE
SPECIFIC GRAVITY, URINE: 1.01 (ref 1.000–1.030)
TOTAL PROTEIN, URINE-UPE24: NEGATIVE
URINE GLUCOSE: NEGATIVE
UROBILINOGEN UA: 1 (ref 0.0–1.0)
pH: 7.5 (ref 5.0–8.0)

## 2014-04-12 LAB — CBC WITH DIFFERENTIAL/PLATELET
BASOS ABS: 0 10*3/uL (ref 0.0–0.1)
Basophils Relative: 0.5 % (ref 0.0–3.0)
EOS ABS: 0.1 10*3/uL (ref 0.0–0.7)
Eosinophils Relative: 3.7 % (ref 0.0–5.0)
HCT: 40.5 % (ref 39.0–52.0)
HEMOGLOBIN: 13.3 g/dL (ref 13.0–17.0)
LYMPHS ABS: 0.7 10*3/uL (ref 0.7–4.0)
Lymphocytes Relative: 23.7 % (ref 12.0–46.0)
MCHC: 32.9 g/dL (ref 30.0–36.0)
MCV: 87.3 fl (ref 78.0–100.0)
MONO ABS: 0.4 10*3/uL (ref 0.1–1.0)
Monocytes Relative: 14.2 % — ABNORMAL HIGH (ref 3.0–12.0)
NEUTROS ABS: 1.7 10*3/uL (ref 1.4–7.7)
Neutrophils Relative %: 57.9 % (ref 43.0–77.0)
PLATELETS: 71 10*3/uL — AB (ref 150.0–400.0)
RBC: 4.64 Mil/uL (ref 4.22–5.81)
RDW: 18.5 % — ABNORMAL HIGH (ref 11.5–15.5)
WBC: 3 10*3/uL — ABNORMAL LOW (ref 4.0–10.5)

## 2014-04-12 LAB — AMMONIA: Ammonia: 66 umol/L — ABNORMAL HIGH (ref 11–35)

## 2014-04-12 LAB — COMPREHENSIVE METABOLIC PANEL
ALK PHOS: 115 U/L (ref 39–117)
ALT: 29 U/L (ref 0–53)
AST: 45 U/L — ABNORMAL HIGH (ref 0–37)
Albumin: 3.5 g/dL (ref 3.5–5.2)
BUN: 9 mg/dL (ref 6–23)
CO2: 22 mEq/L (ref 19–32)
CREATININE: 0.9 mg/dL (ref 0.4–1.5)
Calcium: 9 mg/dL (ref 8.4–10.5)
Chloride: 106 mEq/L (ref 96–112)
GFR: 100.28 mL/min (ref >60.00)
Glucose, Bld: 87 mg/dL (ref 70–99)
Potassium: 3.5 mEq/L (ref 3.5–5.1)
SODIUM: 138 meq/L (ref 135–145)
TOTAL PROTEIN: 6.8 g/dL (ref 6.0–8.3)
Total Bilirubin: 1.5 mg/dL — ABNORMAL HIGH (ref 0.2–1.2)

## 2014-04-12 LAB — PROTIME-INR
INR: 1.2 ratio — ABNORMAL HIGH (ref 0.8–1.0)
Prothrombin Time: 13.7 s — ABNORMAL HIGH (ref 9.6–13.1)

## 2014-04-12 MED ORDER — LACTULOSE 10 GM/15ML PO SOLN: 60.0000 g | Freq: Four times a day (QID) | ORAL | Status: DC

## 2014-04-12 NOTE — Patient Instructions (Addendum)
Your physician has requested that you go to the basement for lab work before leaving today   After your labs, go to the X ray department for an X RAY  You have been scheduled for a CT scan of the brain at [facility] ([address]---this is in the same building as Press [facility]).   You are scheduled on today at 3:30pm. You should arrive 15 minutes prior to your appointment time for registration. Please follow the written instructions below on the day of your exam:  WARNING: IF YOU ARE ALLERGIC TO IODINE/X-RAY DYE, PLEASE NOTIFY RADIOLOGY IMMEDIATELY AT [phone]! YOU WILL BE GIVEN A 13 HOUR PREMEDICATION PREP.  1)You may take any medications as prescribed with a small amount of water except for the following: Metformin, Glucophage, Glucovance, Avandamet, Riomet, Fortamet, Actoplus Met, Janumet, Glumetza or Metaglip. The above medications must be held the day of the exam AND 48 hours after the exam.  The purpose of you drinking the oral contrast is to aid in the visualization of your intestinal tract. The contrast solution may cause some diarrhea. Before your exam is started, you will be given a small amount of fluid to drink. Depending on your individual set of symptoms, you may also receive an intravenous injection of x-ray contrast/dye. Plan on being at [facility] for 30 minutes or long, depending on the type of exam you are having performed.  If you have any questions regarding your exam or if you need to reschedule, you may call the CT department at [phone] between the hours of 8:00 am and 5:00 pm, Monday-Friday.  You have been scheduled for a Modified Barium Esophogram at [facility] (1st floor of the hospital) on 04/18/2014 at 11:00am. Please arrive 15 minutes prior to your appointment for registration. If you need to reschedule for any reason, please contact radiology at [phone] to do  so. __________________________________________________________________ A barium swallow is an examination that concentrates on views of the esophagus. This tends to be a double contrast exam (barium and two liquids which, when combined, create a gas to distend the wall of the oesophagus) or single contrast (non-ionic iodine based). The study is usually tailored to your symptoms so a good history is essential. Attention is paid during the study to the form, structure and configuration of the esophagus, looking for functional disorders (such as aspiration, dysphagia, achalasia, motility and reflux) EXAMINATION You may be asked to change into a gown, depending on the type of swallow being performed. A radiologist and radiographer will perform the procedure. The radiologist will advise you of the type of contrast selected for your procedure and direct you during the exam. You will be asked to stand, sit or lie in several different positions and to hold a small amount of fluid in your mouth before being asked to swallow while the imaging is performed .In some instances you may be asked to swallow barium coated marshmallows to assess the motility of a solid food bolus. The exam can be recorded as a digital or video fluoroscopy procedure. POST PROCEDURE It will take 1-2 days for the barium to pass through your system. To facilitate this, it is important, unless otherwise directed, to increase your fluids for the next 24-48hrs and to resume your normal diet.  This test typically takes about 30 minutes to perform. __________________________________________________________________________________   ____________________________________________________________________  As discussed with Cecille Rubin, increase your Lactulose to 60 cc's four times a day.    Continue your Xifaxan.

## 2014-04-12 NOTE — Progress Notes (Signed)
Patient ID: [name], male   DOB: 06/16/1965, 48 y.o.   MRN: [id number]     History of Present Illness: [name] is here today for follow up of his alcoholic cirrhosis and hepatic encephalopathy. He was lastr seen her 10 days ago, but feels his symptoms have gotten worse. He feels his balance is off and he is sometimes more confused. He has been having urinary frequency and says 2 days ago, his urine burned and had an odor. He has fallen 4 times in the past 10 days, and says his tailbone is very sore. He has no headache or blurred vision, but says he intermittently feels off balance. He has been using lactulose 60 cc tid and xifaxan. He has not had fever or chills. He says he feels he is going to choke when he eats, and his food "wants to go down the wrong pipe".His bowels have been very firm.   Past Medical History  Diagnosis Date  . HIV DISEASE 06/23/2006  . HYPERLIPIDEMIA 06/23/2006  . ANXIETY 06/23/2006  . DEPRESSION 06/23/2006  . HYPERTENSION 06/23/2006  . 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 Ascites 11/13/2009  . STRAIN, CHEST WALL 03/15/2007  . 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 Gastric ulcer 06/2010  . Alcoholic cirrhosis of liver   . Alcoholism, chronic   . Iron deficiency anemia   . GERD (gastroesophageal reflux disease)   . SBP (spontaneous bacterial peritonitis) 05/03/2011    Suspected by high leukocytes on paracentesis. Clinical scenario also compatible. November 2012 responded to Levaquin. Started on trimethoprim-sulfamethoxazole double strength daily for prophylaxis.   . Obesity (BMI 30-39.9)     BMI 34 kg/m^2  . Left foot infection   . Ascites   . Portal hypertensive gastropathy 01/02/2013  . Blood dyscrasia     HIV  . Skin cancer     left calf  . 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 Avulsion fracture of middle phalanges of  3rd/4th fingers left hand 01/07/2014     Past Surgical History  Procedure Laterality Date  . Esophagogastroduodenoscopy w/ banding  06/26/2010    variceal ligation  . Carpal tunnel release      left  . Esophagogastroduodenoscopy  07/01/2010;  08/12/10    small varices, gastric ulcer  . Esophagogastroduodenoscopy  10/26/2011    Procedure: ESOPHAGOGASTRODUODENOSCOPY (EGD);  Surgeon: Gatha Mayer, MD;  Location: [facility];  Service: Endoscopy;  Laterality: N/A;  . Upper gastrointestinal endoscopy    . Colonoscopy  march 2013  . Esophagogastroduodenoscopy N/A 01/02/2013    Procedure: ESOPHAGOGASTRODUODENOSCOPY (EGD);  Surgeon: Gatha Mayer, MD;  Location: [facility];  Service: Endoscopy;  Laterality: N/A;  . Gastric varices banding N/A 01/02/2013    Procedure: GASTRIC VARICES BANDING;  Surgeon: Gatha Mayer, MD;  Location: [facility];  Service: Endoscopy;  Laterality: N/A;  possible banding  . Esophagogastroduodenoscopy N/A 04/23/2013    Procedure: ESOPHAGOGASTRODUODENOSCOPY (EGD);  Surgeon: Gatha Mayer, MD;  Location: [facility];  Service: Endoscopy;  Laterality: N/A;  . Esophagogastroduodenoscopy N/A 05/01/2013    Procedure: ESOPHAGOGASTRODUODENOSCOPY (EGD);  Surgeon: Gatha Mayer, MD;  Location: [facility];  Service: Endoscopy;  Laterality: N/A;  follow-up varices and possibly band them  . Esophageal banding  05/01/2013    Procedure: ESOPHAGEAL BANDING;  Surgeon: Gatha Mayer, MD;  Location: [facility];  Service: Endoscopy;;  . Esophagogastroduodenoscopy N/A 09/04/2013    Procedure: ESOPHAGOGASTRODUODENOSCOPY (EGD);  Surgeon: Glendell Docker  Simonne Maffucci, MD;  Location: [facility];  Service: Endoscopy;  Laterality: N/A;   Family History  Problem Relation Age of Onset  . Hyperlipidemia Mother   . Hypertension Mother   . Breast cancer Mother     questionable  . Heart disease Mother   . Hypertension Father   . Irritable bowel syndrome Father   . Drug abuse Brother   . Heart disease Brother   . Breast cancer  Maternal Aunt     maternal great aunt  . Colon cancer Neg Hx   . Alcohol abuse Other   . Heart disease Maternal Uncle   . Irritable bowel syndrome Paternal Aunt    History  Substance Use Topics  . Smoking status: Former Smoker -- 0.10 packs/day for 10 years    Types: Cigarettes    Start date: 03/03/2014  . Smokeless tobacco: Never Used     Comment: form given 05-19-11  . Alcohol Use: No     Comment: pt is an alcoholic currently in [employer] meetings   Current Outpatient Prescriptions  Medication Sig Dispense Refill  . acyclovir (ZOVIRAX) 400 MG tablet Take 400 mg by mouth 2 (two) times daily as needed (cold sores).       . ALPRAZolam (XANAX) 1 MG tablet Take 1 tablet (1 mg total) by mouth 2 (two) times daily.  60 tablet  3  . aMILoride (MIDAMOR) 5 MG tablet Take 10 mg by mouth every morning.       Marland Kitchen amitriptyline (ELAVIL) 100 MG tablet Take 1 tablet (100 mg total) by mouth at bedtime.  30 tablet  3  . B Complex-C (B-COMPLEX WITH VITAMIN C) tablet Take 1 tablet by mouth daily.      . dolutegravir (TIVICAY) 50 MG tablet Take 1 tablet (50 mg total) by mouth daily.  30 tablet  11  . emtricitabine-tenofovir (TRUVADA) 200-300 MG per tablet Take 1 tablet by mouth daily.  30 tablet  11  . esomeprazole (NEXIUM) 40 MG capsule Take 1 capsule (40 mg total) by mouth daily before breakfast.  30 capsule  11  . furosemide (LASIX) 40 MG tablet TAKE 1 TABLET BY MOUTH TWICE DAILY  60 tablet  0  . lactulose (CHRONULAC) 10 GM/15ML solution Take 90 mLs (60 g total) by mouth 4 (four) times daily.  500 mL  1  . Polyethyl Glycol-Propyl Glycol (SYSTANE OP) Apply 1 drop to eye as needed (dry eyes).      . potassium chloride SA (K-DUR,KLOR-CON) 20 MEQ tablet take 1/2 tablet in the morning and 1/2 tablet in the evening      . thiamine (VITAMIN B-1) 50 MG tablet Take 50 mg by mouth daily.      Marland Kitchen XIFAXAN 550 MG TABS tablet TAKE 1 TABLET BY MOUTH TWICE DAILY  60 tablet  11   No current facility-administered  medications for this visit.   Allergies  Allergen Reactions  . Penicillins     REACTION: hives      Review of Systems: Gen: Denies any fever, chills, sweats, anorexia, fatigue, weakness, malaise, weight loss, and sleep disorder CV: Denies chest pain, angina, palpitations, syncope, orthopnea, PND, peripheral edema, and claudication. Resp: Denies dyspnea at rest, dyspnea with exercise, cough, sputum, wheezing, coughing up blood, and pleurisy. GI: Denies vomiting blood, jaundice, and fecal incontinence.   Feels food "wants to go down wrong pipe" GU : Denies urinary burning, blood in urine, urinary frequency, urinary hesitancy, nocturnal urination, and urinary incontinence. MS: Denies joint pain,  limitation of movement, and swelling, stiffness, low back pain, extremity pain. Denies muscle weakness, cramps, atrophy. Has pain over coccyx Derm: Denies rash, itching, dry skin, hives, moles, warts, or unhealing ulcers.  Psych: Denies depression, anxiety, memory loss, suicidal ideation, hallucinations, paranoia, and confusion. Heme: Denies bruising, bleeding, and enlarged lymph nodes. Neuro:  Denies any headaches, dizziness, paresthesia. Feels he is falling Endo:  Denies any problems with DM, thyroid, adrenal  Physical Exam: General: Pleasant, well developed , male in no acute distress, breath with heavy, fruity odor Head: Normocephalic and atraumatic Eyes:  sclerae anicteric, conjunctiva pink  Ears: Normal auditory acuity Lungs: Clear throughout to auscultation Heart: Regular rate and rhythm Abdomen: Soft, non distended, non-tender. No masses, no hepatomegaly. Normal bowel sounds Musculoskeletal: Symmetrical with no gross deformities , very tender over sacrum and coccyx Extremities: No edema  Neurological: Alert oriented , grossly nonfocal, no asterixis Psychological:  Alert and cooperative. Normal mood and affect  Assessment and Recommendations:  1. Alcoholic cirrhosis. Appears stable.  Awaiting decision from [facility] regarding transplant. 2. Hepatic encephalopathy. Will increase lactulose to 60 cc qid. Continue xifaxan. AS he has had 4 falls, will check CT brain--eval for bleed. Check ammonia level, cbc, CMET, PT/INR. Will check urinalysis and urine culture as well since pt had some dysuria.  3. Coccyx pain. Will check sacral/coccyx film--if with fx--donut pillow.  4. Dysphagia. Pt describes coughing a lot during meals. Will check modified barium swallow to eval for possible transfer dysphagia. Pt has been reviewed with Dr Olevia Perches, who is in the office this afternoon.       Schneider Warchol, Vita Barley PA-C 04/12/2014,

## 2014-04-13 NOTE — Progress Notes (Signed)
I suspect he is not taking Lactulose as prescribed which explains constipation. Agree with increased Lactulose.

## 2014-04-14 LAB — CULTURE, URINE COMPREHENSIVE
Colony Count: NO GROWTH
Organism ID, Bacteria: NO GROWTH

## 2014-04-15 ENCOUNTER — Telehealth: Payer: Self-pay | Admitting: Physician Assistant

## 2014-04-15 NOTE — Telephone Encounter (Signed)
See results note. 

## 2014-04-16 ENCOUNTER — Telehealth: Payer: Self-pay | Admitting: Physician Assistant

## 2014-04-16 MED ORDER — LACTULOSE 10 GM/15ML PO SOLN: ORAL | Status: DC

## 2014-04-16 NOTE — Telephone Encounter (Signed)
Pharmacy called questioning quantity sent in on 04/12/14 by Lolita Patella. Talked to [facility] patient is supposed to be taking Lactulose 60 ml's four times a day 7200 ml's with 1 refill phoned in to the pharmacy.

## 2014-04-18 ENCOUNTER — Ambulatory Visit (HOSPITAL_COMMUNITY): Admission: RE | Admit: 2014-04-18 | Payer: Medicaid Other | Source: Ambulatory Visit

## 2014-04-18 ENCOUNTER — Ambulatory Visit (HOSPITAL_COMMUNITY)
Admission: RE | Admit: 2014-04-18 | Discharge: 2014-04-18 | Disposition: A | Discharge status: Home / Self Care | Payer: Medicaid Other | Source: Ambulatory Visit | Attending: Physician Assistant | Admitting: Physician Assistant

## 2014-04-18 DIAGNOSIS — R296 Repeated falls: Secondary | ICD-10-CM

## 2014-04-18 DIAGNOSIS — K7031 Alcoholic cirrhosis of liver with ascites: Secondary | ICD-10-CM

## 2014-04-18 DIAGNOSIS — G9340 Encephalopathy, unspecified: Secondary | ICD-10-CM

## 2014-04-18 DIAGNOSIS — R300 Dysuria: Secondary | ICD-10-CM

## 2014-04-18 DIAGNOSIS — R1310 Dysphagia, unspecified: Secondary | ICD-10-CM

## 2014-04-18 NOTE — Procedures (Signed)
Objective Swallowing Evaluation: Modified Barium Swallowing Study  Patient Details  Name: [name] MRN: [id number] Date of Birth: January 09, 1966  Today's Date: 04/18/2014 Time: 1200-1230 SLP Time Calculation (min): 30 min  Past Medical History:  Past Medical History  Diagnosis Date  . HIV DISEASE 06/23/2006  . HYPERLIPIDEMIA 06/23/2006  . ANXIETY 06/23/2006  . DEPRESSION 06/23/2006  . HYPERTENSION 06/23/2006  . 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 Ascites 11/13/2009  . STRAIN, CHEST WALL 03/15/2007  . 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 Gastric ulcer 06/2010  . Alcoholic cirrhosis of liver   . Alcoholism, chronic   . Iron deficiency anemia   . GERD (gastroesophageal reflux disease)   . SBP (spontaneous bacterial peritonitis) 05/03/2011    Suspected by high leukocytes on paracentesis. Clinical scenario also compatible. November 2012 responded to Levaquin. Started on trimethoprim-sulfamethoxazole double strength daily for prophylaxis.   . Obesity (BMI 30-39.9)     BMI 34 kg/m^2  . Left foot infection   . Ascites   . Portal hypertensive gastropathy 01/02/2013  . Blood dyscrasia     HIV  . Skin cancer     left calf  . 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 Avulsion fracture of middle phalanges of  3rd/4th fingers left hand 01/07/2014   Past Surgical History:  Past Surgical History  Procedure Laterality Date  . Esophagogastroduodenoscopy w/ banding  06/26/2010    variceal ligation  . Carpal tunnel release      left  . Esophagogastroduodenoscopy  07/01/2010;  08/12/10    small varices, gastric ulcer  . Esophagogastroduodenoscopy  10/26/2011    Procedure: ESOPHAGOGASTRODUODENOSCOPY (EGD);  Surgeon: Gatha Mayer, MD;  Location: [facility];  Service: Endoscopy;  Laterality: N/A;  . Upper gastrointestinal endoscopy    . Colonoscopy  march 2013  . Esophagogastroduodenoscopy N/A  01/02/2013    Procedure: ESOPHAGOGASTRODUODENOSCOPY (EGD);  Surgeon: Gatha Mayer, MD;  Location: [facility];  Service: Endoscopy;  Laterality: N/A;  . Gastric varices banding N/A 01/02/2013    Procedure: GASTRIC VARICES BANDING;  Surgeon: Gatha Mayer, MD;  Location: [facility];  Service: Endoscopy;  Laterality: N/A;  possible banding  . Esophagogastroduodenoscopy N/A 04/23/2013    Procedure: ESOPHAGOGASTRODUODENOSCOPY (EGD);  Surgeon: Gatha Mayer, MD;  Location: [facility];  Service: Endoscopy;  Laterality: N/A;  . Esophagogastroduodenoscopy N/A 05/01/2013    Procedure: ESOPHAGOGASTRODUODENOSCOPY (EGD);  Surgeon: Gatha Mayer, MD;  Location: [facility];  Service: Endoscopy;  Laterality: N/A;  follow-up varices and possibly band them  . Esophageal banding  05/01/2013    Procedure: ESOPHAGEAL BANDING;  Surgeon: Gatha Mayer, MD;  Location: [facility];  Service: Endoscopy;;  . Esophagogastroduodenoscopy N/A 09/04/2013    Procedure: ESOPHAGOGASTRODUODENOSCOPY (EGD);  Surgeon: Gatha Mayer, MD;  Location: [facility];  Service: Endoscopy;  Laterality: N/A;   HPI:  48 yo male referred for MBS due to dysphagia.  Pt with PMH + for HIV, depression, anxiety, falls-slurred speech, neuropathy, sinusitis, reflux, esophageal varices, cirrhosis, anemia, encephalopathy.  Pt has undergone multiple EGD studies last 09/04/13 with varices noted in distal and mid esophagus.  CT head 04/12/14 showed atrophy, slight attentuation in basal ganglia.  Abdomen image 2015 : Cirrhotic liver with portal venous hypertension and splenomegaly - stable per Dr Celesta Aver note .  Pt medication list includes acyclovir, alprazolam, amiloride, amitriptyline, b complex, dolutegravir, emtricitabine-tenofovir, esomeprazole, furosemide, lactulose, potassium chloride, systane OP prn, thiamine, xifaxan.  Pt reports  sensation of "tickling" at left side of his neck.   His parent who were present during testing informed SLP after  that pt frequently gets choked on popcorn.      Assessment / Plan / Recommendation Clinical Impression  Dysphagia Diagnosis: Suspected primary esophageal dysphagia;Within Functional Limits- Minimal oropharyngeal dysphagia  Clinical impression: Minimal oropharyngeal phase dysphagia with xerostomia and dis-coordination likely contributing to oral transit delays.  Minimal pharyngeal residuals noted with liquids more than solids that were cleared with cued dry swallows.  No aspiration or penetration of any consistency tested noted. Clearance into esophagus from pharynx appeared adequate.    Barium tablet taken with liquid appeared to lodge mid-esophagus without pt awareness but appeared to clear with further liquid intake.  Pt also with mild residuals at distal esophagus - consumption of water helped to clear as well.   With pt's h/o esophageal varices ? If this is source of delayed clearance.  Radiologist was not present to confirm findings.     Educated pt/family to results of testing, compensation strategies to mitigate minimal dysphagia.  Advised pt to start meals with liquids due to his xerostomia, follow solids with drinks and intermittently dry swallow to facilitate clearance.      Treatment Recommendation  No treatment recommended at this time    Diet Recommendation Regular;Thin liquid (extra gravy sauce, avoid particulate matter if problematic)   Liquid Administration via: Cup;Straw Medication Administration: Whole meds with liquid Supervision: Patient able to self feed Compensations: Slow rate;Small sips/bites;Follow solids with liquid (intermittent dry swallow) Postural Changes and/or Swallow Maneuvers: Upright 30-60 min after meal;Out of bed for meals    Other  Recommendations Oral Care Recommendations: Oral care BID   Follow Up Recommendations  None      General Date of Onset: 04/18/14 HPI: 48 yo male referred for MBS due to dysphagia.  Pt with PMH + for HIV, depression,  anxiety, falls-slurred speech, neuropathy, sinusitis, reflux, esophageal varices, cirrhosis, anemia, encephalopathy.  Pt has undergone multiple EGD studies last 09/04/13 with varisces noted in distal and mid esophagus.  CT head 04/12/14 showed atrophy, slight attentuation in basal ganglia.  Abdomen image 2015 : Cirrhotic liver with portal venous hypertension and splenomegaly - stable per Dr Celesta Aver note .  Pt medication list includes acyclovir, alprazolam, amiloride, amitriptyline, b complex, dolutegravir, emtricitabine-tenofovir, esomeprazole, furosemide, lactulose, potassium chloride, systane OP prn, thiamine, xifaxan.  Pt reports sensation of "tickling" at left side of his neck.   Type of Study: Modified Barium Swallowing Study Reason for Referral: Objectively evaluate swallowing function Diet Prior to this Study: Regular;Thin liquids Respiratory Status: Room air History of Recent Intubation: No Behavior/Cognition: Alert;Cooperative;Pleasant mood Oral Cavity - Dentition: Adequate natural dentition Oral Motor / Sensory Function: Within functional limits Self-Feeding Abilities: Able to feed self Patient Positioning: Upright in chair Baseline Vocal Quality: Clear Volitional Cough: Strong Volitional Swallow: Able to elicit Anatomy: Within functional limits Pharyngeal Secretions: Not observed secondary MBS    Reason for Referral Objectively evaluate swallowing function   Oral Phase Oral Preparation/Oral Phase Oral Phase: Impaired Oral - Nectar Oral - Nectar Straw: Within functional limits Oral - Thin Oral - Thin Teaspoon: Within functional limits Oral - Thin Cup: Within functional limits Oral - Solids Oral - Puree: Within functional limits Oral - Regular: Within functional limits Oral - Pill:  (pt required two boluses of liquids to aid oral transiting of tablet)   Pharyngeal Phase Pharyngeal - Nectar Pharyngeal - Nectar Straw: Pharyngeal residue - valleculae;Pharyngeal residue -  pyriform  sinuses;Within functional limits Pharyngeal - Thin Pharyngeal - Thin Teaspoon: Within functional limits Pharyngeal - Thin Cup: Within functional limits Pharyngeal - Thin Straw: Pharyngeal residue - valleculae;Within functional limits Pharyngeal - Solids Pharyngeal - Puree: Within functional limits Pharyngeal - Regular: Within functional limits Pharyngeal - Pill: Within functional limits  Cervical Esophageal Phase    GO    Cervical Esophageal Phase Cervical Esophageal Phase: Impaired Cervical Esophageal Phase - Comment Cervical Esophageal Comment: clearance into esophagus from pharynx appeared adequate, barium tablet appeared to lodge mid-esophagus without pt awareness, pt also appeared with mild residuals at distal esophagus - consumption of water facilitated clearance, radiologist was not present to confirm findings    Functional Assessment Tool Used: mbs, clinical judgement Functional Limitations: Swallowing Swallow Current Status (H7342): At least 1 percent but less than 20 percent impaired, limited or restricted Swallow Goal Status [phone]): At least 1 percent but less than 20 percent impaired, limited or restricted Swallow Discharge Status [phone]): At least 1 percent but less than 20 percent impaired, limited or restricted   Luanna Salk, [facility] SLP [phone]

## 2014-04-21 NOTE — Progress Notes (Signed)
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 Probably not compliant with lactulose also.  Will f/u by phone to see how things are.

## 2014-04-22 ENCOUNTER — Telehealth: Payer: Self-pay | Admitting: Internal Medicine

## 2014-04-22 DIAGNOSIS — G4700 Insomnia, unspecified: Secondary | ICD-10-CM

## 2014-04-22 MED ORDER — ALPRAZOLAM 1 MG PO TABS: 1.0000 mg | ORAL_TABLET | Freq: Two times a day (BID) | ORAL | Status: DC

## 2014-04-22 MED ORDER — AMITRIPTYLINE HCL 100 MG PO TABS: 100.0000 mg | ORAL_TABLET | Freq: Every day | ORAL | Status: DC

## 2014-04-22 NOTE — Telephone Encounter (Signed)
Please advise [name]? 

## 2014-04-22 NOTE — Telephone Encounter (Signed)
Spoke with patient and confirmed pharmacy ([facility] in [place]), rx's faxed to the pharmacy.  Patient was not at home so he will call back to set up late January appointment with Dr Carlean Purl.  Appointment with Cecille Rubin canceled per Dr Carlean Purl.

## 2014-04-22 NOTE — Telephone Encounter (Signed)
OK to refill x 4 total  Also - I spoke to him and he is much clearer on new dose of lactulose and no more falls so we can cancel 11/16 appt w/ Cecille Rubin  He will need REV me late Jan 2016

## 2014-04-29 ENCOUNTER — Ambulatory Visit: Payer: Medicaid Other | Admitting: Physician Assistant

## 2014-05-01 ENCOUNTER — Ambulatory Visit: Payer: Medicaid Other | Admitting: Infectious Disease

## 2014-05-07 ENCOUNTER — Telehealth: Payer: Self-pay

## 2014-05-07 NOTE — Telephone Encounter (Signed)
-----   Message from Darden Dates sent at 05/07/2014 12:42 PM EST ----- Donnel Saxon PA needs to know which antibiotic is appropriate for this pt.  Has had for 2-3 wks.  Can be reached at [phone] can ask for her or if she is with a pt, just let the nurse know which one is good for his condition  Thanks Amy

## 2014-05-07 NOTE — Telephone Encounter (Signed)
PA's # given to Dr Carlean Purl and he is going to call Donnel Saxon PA-C.

## 2014-05-13 ENCOUNTER — Ambulatory Visit: Payer: Medicaid Other | Admitting: Infectious Disease

## 2014-05-16 ENCOUNTER — Other Ambulatory Visit: Payer: Self-pay | Admitting: Infectious Disease

## 2014-05-21 ENCOUNTER — Telehealth: Payer: Self-pay | Admitting: Internal Medicine

## 2014-05-21 NOTE — Telephone Encounter (Signed)
Left message for patient to call back  

## 2014-05-22 ENCOUNTER — Other Ambulatory Visit: Payer: Self-pay | Admitting: Internal Medicine

## 2014-05-22 ENCOUNTER — Other Ambulatory Visit: Payer: Self-pay | Admitting: Infectious Disease

## 2014-05-22 DIAGNOSIS — B20 Human immunodeficiency virus [HIV] disease: Secondary | ICD-10-CM

## 2014-05-23 NOTE — Telephone Encounter (Signed)
I spoke with his mother and she says that he is doing better and they forgot to call back.  I did set him up for a follow up for 07/15/14

## 2014-06-10 ENCOUNTER — Telehealth: Payer: Self-pay | Admitting: Internal Medicine

## 2014-06-10 NOTE — Telephone Encounter (Signed)
There are no available appts with [name], his mother will take him to the ED if she can get him to go.

## 2014-06-10 NOTE — Telephone Encounter (Signed)
He needs to be seen tomorrow and if that is not possible would need to go to ED

## 2014-06-10 NOTE — Telephone Encounter (Signed)
Pt is confused and sleeping all the time, mother states he looks swollen all over and she wants to know if he needs to be seen.  Dr Carlean Purl please advise

## 2014-06-11 ENCOUNTER — Emergency Department (HOSPITAL_COMMUNITY)
Admission: EM | Admit: 2014-06-11 | Discharge: 2014-06-11 | Disposition: A | Discharge status: Home / Self Care | Payer: Medicaid Other | Attending: Emergency Medicine | Admitting: Emergency Medicine

## 2014-06-11 ENCOUNTER — Emergency Department (HOSPITAL_COMMUNITY): Payer: Medicaid Other

## 2014-06-11 ENCOUNTER — Encounter (HOSPITAL_COMMUNITY): Payer: Self-pay | Admitting: Emergency Medicine

## 2014-06-11 ENCOUNTER — Telehealth: Payer: Self-pay | Admitting: Internal Medicine

## 2014-06-11 DIAGNOSIS — Z8709 Personal history of other diseases of the respiratory system: Secondary | ICD-10-CM | POA: Diagnosis not present

## 2014-06-11 DIAGNOSIS — S99921A Unspecified injury of right foot, initial encounter: Secondary | ICD-10-CM | POA: Diagnosis present

## 2014-06-11 DIAGNOSIS — D509 Iron deficiency anemia, unspecified: Secondary | ICD-10-CM | POA: Diagnosis not present

## 2014-06-11 DIAGNOSIS — S92911A Unspecified fracture of right toe(s), initial encounter for closed fracture: Secondary | ICD-10-CM

## 2014-06-11 DIAGNOSIS — Y9289 Other specified places as the place of occurrence of the external cause: Secondary | ICD-10-CM | POA: Insufficient documentation

## 2014-06-11 DIAGNOSIS — S92511A Displaced fracture of proximal phalanx of right lesser toe(s), initial encounter for closed fracture: Secondary | ICD-10-CM | POA: Diagnosis not present

## 2014-06-11 DIAGNOSIS — K5900 Constipation, unspecified: Secondary | ICD-10-CM | POA: Insufficient documentation

## 2014-06-11 DIAGNOSIS — Z87448 Personal history of other diseases of urinary system: Secondary | ICD-10-CM | POA: Insufficient documentation

## 2014-06-11 DIAGNOSIS — Z79899 Other long term (current) drug therapy: Secondary | ICD-10-CM | POA: Diagnosis not present

## 2014-06-11 DIAGNOSIS — Z87891 Personal history of nicotine dependence: Secondary | ICD-10-CM | POA: Diagnosis not present

## 2014-06-11 DIAGNOSIS — Z880 Allergy status to penicillin: Secondary | ICD-10-CM | POA: Diagnosis not present

## 2014-06-11 DIAGNOSIS — Y9389 Activity, other specified: Secondary | ICD-10-CM | POA: Insufficient documentation

## 2014-06-11 DIAGNOSIS — E7220 Disorder of urea cycle metabolism, unspecified: Secondary | ICD-10-CM | POA: Insufficient documentation

## 2014-06-11 DIAGNOSIS — E669 Obesity, unspecified: Secondary | ICD-10-CM | POA: Diagnosis not present

## 2014-06-11 DIAGNOSIS — Z85828 Personal history of other malignant neoplasm of skin: Secondary | ICD-10-CM | POA: Insufficient documentation

## 2014-06-11 DIAGNOSIS — I10 Essential (primary) hypertension: Secondary | ICD-10-CM | POA: Diagnosis not present

## 2014-06-11 DIAGNOSIS — K219 Gastro-esophageal reflux disease without esophagitis: Secondary | ICD-10-CM | POA: Diagnosis not present

## 2014-06-11 DIAGNOSIS — R410 Disorientation, unspecified: Secondary | ICD-10-CM | POA: Diagnosis not present

## 2014-06-11 DIAGNOSIS — G8911 Acute pain due to trauma: Secondary | ICD-10-CM | POA: Insufficient documentation

## 2014-06-11 DIAGNOSIS — Y998 Other external cause status: Secondary | ICD-10-CM | POA: Insufficient documentation

## 2014-06-11 DIAGNOSIS — R7989 Other specified abnormal findings of blood chemistry: Secondary | ICD-10-CM

## 2014-06-11 DIAGNOSIS — W1839XA Other fall on same level, initial encounter: Secondary | ICD-10-CM | POA: Insufficient documentation

## 2014-06-11 DIAGNOSIS — Z21 Asymptomatic human immunodeficiency virus [HIV] infection status: Secondary | ICD-10-CM | POA: Diagnosis not present

## 2014-06-11 DIAGNOSIS — Z872 Personal history of diseases of the skin and subcutaneous tissue: Secondary | ICD-10-CM | POA: Diagnosis not present

## 2014-06-11 DIAGNOSIS — Z8669 Personal history of other diseases of the nervous system and sense organs: Secondary | ICD-10-CM | POA: Diagnosis not present

## 2014-06-11 LAB — CBC WITH DIFFERENTIAL/PLATELET
BASOS PCT: 1 % (ref 0–1)
Basophils Absolute: 0 10*3/uL (ref 0.0–0.1)
Eosinophils Absolute: 0.1 10*3/uL (ref 0.0–0.7)
Eosinophils Relative: 4 % (ref 0–5)
HCT: 41.4 % (ref 39.0–52.0)
Hemoglobin: 13.3 g/dL (ref 13.0–17.0)
LYMPHS ABS: 0.9 10*3/uL (ref 0.7–4.0)
Lymphocytes Relative: 23 % (ref 12–46)
MCH: 27.5 pg (ref 26.0–34.0)
MCHC: 32.1 g/dL (ref 30.0–36.0)
MCV: 85.7 fL (ref 78.0–100.0)
Monocytes Absolute: 0.5 10*3/uL (ref 0.1–1.0)
Monocytes Relative: 12 % (ref 3–12)
NEUTROS ABS: 2.4 10*3/uL (ref 1.7–7.7)
NEUTROS PCT: 61 % (ref 43–77)
Platelets: 76 10*3/uL — ABNORMAL LOW (ref 150–400)
RBC: 4.83 MIL/uL (ref 4.22–5.81)
RDW: 16.5 % — ABNORMAL HIGH (ref 11.5–15.5)
WBC: 4 10*3/uL (ref 4.0–10.5)

## 2014-06-11 LAB — BASIC METABOLIC PANEL
Anion gap: 8 (ref 5–15)
BUN: 10 mg/dL (ref 6–23)
CHLORIDE: 107 meq/L (ref 96–112)
CO2: 26 mmol/L (ref 19–32)
Calcium: 9.1 mg/dL (ref 8.4–10.5)
Creatinine, Ser: 0.93 mg/dL (ref 0.50–1.35)
GFR calc non Af Amer: >90 mL/min (ref >90)
Glucose, Bld: 105 mg/dL — ABNORMAL HIGH (ref 70–99)
POTASSIUM: 3.2 mmol/L — AB (ref 3.5–5.1)
Sodium: 141 mmol/L (ref 135–145)

## 2014-06-11 LAB — HEPATIC FUNCTION PANEL
ALT: 30 U/L (ref 0–53)
AST: 39 U/L — ABNORMAL HIGH (ref 0–37)
Albumin: 3.9 g/dL (ref 3.5–5.2)
Alkaline Phosphatase: 132 U/L — ABNORMAL HIGH (ref 39–117)
Bilirubin, Direct: 0.4 mg/dL — ABNORMAL HIGH (ref 0.0–0.3)
Indirect Bilirubin: 1.3 mg/dL — ABNORMAL HIGH (ref 0.3–0.9)
Total Bilirubin: 1.7 mg/dL — ABNORMAL HIGH (ref 0.3–1.2)
Total Protein: 7 g/dL (ref 6.0–8.3)

## 2014-06-11 LAB — AMMONIA: Ammonia: 89 umol/L — ABNORMAL HIGH (ref 11–32)

## 2014-06-11 MED ORDER — POTASSIUM CHLORIDE CRYS ER 20 MEQ PO TBCR
40.0000 meq | EXTENDED_RELEASE_TABLET | Freq: Once | ORAL | Status: AC
  Administered 2014-06-11: 40 meq via ORAL
  Filled 2014-06-11: qty 2

## 2014-06-11 MED ORDER — LACTULOSE 10 GM/15ML PO SOLN
60.0000 g | Freq: Once | ORAL | Status: AC
  Administered 2014-06-11: 60 g via ORAL
  Filled 2014-06-11: qty 90

## 2014-06-11 NOTE — ED Notes (Signed)
Patient transported to X-ray 

## 2014-06-11 NOTE — ED Notes (Signed)
Per mother states increased confusion, possibly related to elevated ammonia-history of cirrhosis

## 2014-06-11 NOTE — Telephone Encounter (Signed)
Spoke with patient's mother and told her no appointments are available today. She will take patient to ED as she was instructed yesterday.

## 2014-06-11 NOTE — Discharge Instructions (Signed)
Ammonia, Plasma Ammonia This is a test to detect elevated levels of ammonia in the blood, to evaluate changes in consciousness, or to help diagnose hepatic encephalopathy and Reye syndrome. It may be ordered when a patient experiences mental changes or lapses into a coma of unknown origin, if an infant or child experiences frequent vomiting and increased lethargy as a newborn, or about a week after a viral illness. Ammonia is a compound produced by intestinal bacteria and by cells in the body during the digestion of protein. Ammonia is a waste product that the liver changes into urea and glutamine. The urea is then carried by the blood to the kidneys, where it is put out in the urine. If this "urea cycle" does not complete, ammonia builds up in the blood. This also happens when you cannot put out urine (kidney failure) or when your liver does not work (hepatic failure). A buildup of ammonia in the body can cause mental and neurological changes that can lead to confusion, disorientation, sleepiness, and eventually to coma and even death. Infants and children with increased ammonia levels may vomit frequently, be irritable, and be increasingly lethargic. Left untreated, they may experience seizures, respiratory difficulty, and may lapse into a coma and die. PREPARATION FOR TEST No preparation or fasting is necessary. A blood sample is taken by a needle from a vein.  Avoid exercising before this test. NORMAL FINDINGS  Normal values depend on the method used for testing. Test results depend on many factors including age, sex, etc. Your lab report should include the specific reference range for your test. Your caregiver will go over you test results with you.  Adults: 10-80 mcg/dL (6-47 micromole/L)  Neonates, 0 to 10 days (enzymatic): 170-341 mcg/dL (100-200 micromole/L)  Infants and toddlers, 10 days to 2 years (enzymatic): 68-136 mcg/dL (40-80 micromole/L)  Children, older than 2 years (enzymatic):  19-60 mcg/dL (11-35 micromole/L) Ranges for normal findings may vary among different laboratories and hospitals. You should always check with your doctor after having lab work or other tests done to discuss the meaning of your test results and whether your values are considered within normal limits. MEANING OF TEST  Your caregiver will go over the test results with you and discuss the importance and meaning of your results, as well as treatment options and the need for additional tests if necessary. OBTAINING THE TEST RESULTS  It is your responsibility to obtain your test results. Ask the lab or department performing the test when and how you will get your results. Document Released: 06/22/2004 Document Revised: 10/15/2013 Document Reviewed: 05/06/2008 [facility] Patient Information 2015 [place], [place]. This information is not intended to replace advice given to you by your health care provider. Make sure you discuss any questions you have with your health care provider.

## 2014-06-11 NOTE — ED Provider Notes (Signed)
CSN: [id number]     Arrival date & time 06/11/14  1137 History   First MD Initiated Contact with Patient 06/11/14 1147     Chief Complaint  Patient presents with  . Foot Pain  . elevated ammonia      (Consider location/radiation/quality/duration/timing/severity/associated sxs/prior Treatment) Patient is a 48 y.o. male presenting with lower extremity pain and altered mental status. The history is provided by the patient and a relative. No language interpreter was used.  Foot Pain This is a new problem. The current episode started in the past 7 days. The problem occurs constantly. The problem has been unchanged. Associated symptoms include fatigue, joint swelling and weakness. Pertinent negatives include no abdominal pain, arthralgias, chest pain, chills, coughing, fever, headaches, nausea or vomiting. Nothing aggravates the symptoms. He has tried nothing for the symptoms. The treatment provided moderate relief.  Altered Mental Status Presenting symptoms: confusion, disorientation and lethargy   Severity:  Moderate Most recent episode:  More than 2 days ago Episode history:  Multiple Timing:  Constant Progression:  Unchanged Chronicity:  Recurrent Context: alcohol use and not taking medications as prescribed ( missed doses of lactulose)   Context: not drug use, not head injury, not a recent change in medication and not a recent illness   Associated symptoms: weakness   Associated symptoms: no abdominal pain, no fever, no headaches, no nausea, no palpitations and no vomiting    Pt is a 48yo male brought to ED by mother with c/o increased confusion, fatigue, for 1 month and right foot with ankle swelling x1 week. Hx of hepatic encephalopathy a couple years ago, mother is concerned pt has an elevated ammonia level again.  Pt denies recent consumption of alcohol but does report feeling more fatigued due to increase stress around Christmas and missed doses of his lactulose during the holidays.   Pt also reports wanting to go to the gym but not being able to due to fatigue and right foot pain from neuropathy and recent fall.  Mother reports pt fell a few weeks ago injuring his tailbone, imaged at that time, no fracture.  Pt did not c/o foot pain at that time but has noticed increased swelling since.  Mother states she brought pt to ED for further evaluation of symptoms as as pt has been unable to schedule a f/u appointment with his PCP or GI specialist until February.  Pt denies fever, n/v/d. Does report not being able to have a BM in a few days, pt does not recall having a BM today. No sick contacts or recent travel. No recent head trauma.   Past Medical History  Diagnosis Date  . HIV DISEASE 06/23/2006  . HYPERLIPIDEMIA 06/23/2006  . ANXIETY 06/23/2006  . DEPRESSION 06/23/2006  . HYPERTENSION 06/23/2006  . 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 Ascites 11/13/2009  . STRAIN, CHEST WALL 03/15/2007  . 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 Gastric ulcer 06/2010  . Alcoholic cirrhosis of liver   . Alcoholism, chronic   . Iron deficiency anemia   . GERD (gastroesophageal reflux disease)   . SBP (spontaneous bacterial peritonitis) 05/03/2011    Suspected by high leukocytes on paracentesis. Clinical scenario also compatible. November 2012 responded to Levaquin. Started on trimethoprim-sulfamethoxazole double strength daily for prophylaxis.   . Obesity (BMI 30-39.9)     BMI 34 kg/m^2  . Left foot infection   . Ascites   . Portal hypertensive gastropathy 01/02/2013  .  Blood dyscrasia     HIV  . Skin cancer     left calf  . 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 Avulsion fracture of middle phalanges of  3rd/4th fingers left hand 01/07/2014   Past Surgical History  Procedure Laterality Date  . Esophagogastroduodenoscopy w/ banding  06/26/2010    variceal ligation  . Carpal tunnel release       left  . Esophagogastroduodenoscopy  07/01/2010;  08/12/10    small varices, gastric ulcer  . Esophagogastroduodenoscopy  10/26/2011    Procedure: ESOPHAGOGASTRODUODENOSCOPY (EGD);  Surgeon: Gatha Mayer, MD;  Location: [facility];  Service: Endoscopy;  Laterality: N/A;  . Upper gastrointestinal endoscopy    . Colonoscopy  march 2013  . Esophagogastroduodenoscopy N/A 01/02/2013    Procedure: ESOPHAGOGASTRODUODENOSCOPY (EGD);  Surgeon: Gatha Mayer, MD;  Location: [facility];  Service: Endoscopy;  Laterality: N/A;  . Gastric varices banding N/A 01/02/2013    Procedure: GASTRIC VARICES BANDING;  Surgeon: Gatha Mayer, MD;  Location: [facility];  Service: Endoscopy;  Laterality: N/A;  possible banding  . Esophagogastroduodenoscopy N/A 04/23/2013    Procedure: ESOPHAGOGASTRODUODENOSCOPY (EGD);  Surgeon: Gatha Mayer, MD;  Location: [facility];  Service: Endoscopy;  Laterality: N/A;  . Esophagogastroduodenoscopy N/A 05/01/2013    Procedure: ESOPHAGOGASTRODUODENOSCOPY (EGD);  Surgeon: Gatha Mayer, MD;  Location: [facility];  Service: Endoscopy;  Laterality: N/A;  follow-up varices and possibly band them  . Esophageal banding  05/01/2013    Procedure: ESOPHAGEAL BANDING;  Surgeon: Gatha Mayer, MD;  Location: [facility];  Service: Endoscopy;;  . Esophagogastroduodenoscopy N/A 09/04/2013    Procedure: ESOPHAGOGASTRODUODENOSCOPY (EGD);  Surgeon: Gatha Mayer, MD;  Location: [facility];  Service: Endoscopy;  Laterality: N/A;   Family History  Problem Relation Age of Onset  . Hyperlipidemia Mother   . Hypertension Mother   . Breast cancer Mother     questionable  . Heart disease Mother   . Hypertension Father   . Irritable bowel syndrome Father   . Drug abuse Brother   . Heart disease Brother   . Breast cancer Maternal Aunt     maternal great aunt  . Colon cancer Neg Hx   . Alcohol abuse Other   . Heart disease Maternal Uncle   . Irritable bowel syndrome Paternal Aunt     History  Substance Use Topics  . Smoking status: Former Smoker -- 0.10 packs/day for 10 years    Types: Cigarettes    Start date: 03/03/2014  . Smokeless tobacco: Never Used     Comment: form given 05-19-11  . Alcohol Use: No     Comment: pt is an alcoholic currently in [place] meetings    Review of Systems  Constitutional: Positive for fatigue. Negative for fever and chills.  Respiratory: Negative for cough and shortness of breath.   Cardiovascular: Negative for chest pain and palpitations.  Gastrointestinal: Negative for nausea, vomiting, abdominal pain, diarrhea and constipation.  Musculoskeletal: Positive for joint swelling. Negative for arthralgias.       Right ankle and foot  Skin: Negative for color change and wound.  Neurological: Positive for weakness. Negative for headaches.  Psychiatric/Behavioral: Positive for confusion.  All other systems reviewed and are negative.     Allergies  Penicillins  Home Medications   Prior to Admission medications   Medication Sig Start Date End Date Taking? Authorizing Provider  ALPRAZolam Duanne Moron) 1 MG tablet Take 1 tablet (1 mg total) by mouth 2 (two) times  daily. 04/22/14  Yes Gatha Mayer, MD  aMILoride (MIDAMOR) 5 MG tablet Take 10 mg by mouth every morning.    Yes Historical Provider, MD  amitriptyline (ELAVIL) 100 MG tablet Take 1 tablet (100 mg total) by mouth at bedtime. 04/22/14  Yes Gatha Mayer, MD  B Complex-C (B-COMPLEX WITH VITAMIN C) tablet Take 1 tablet by mouth daily.   Yes Historical Provider, MD  esomeprazole (NEXIUM) 40 MG capsule Take 1 capsule (40 mg total) by mouth daily before breakfast. 12/03/13  Yes Gatha Mayer, MD  furosemide (LASIX) 40 MG tablet TAKE 1 TABLET BY MOUTH TWICE DAILY   Yes Gatha Mayer, MD  lactulose [facility]) 10 GM/15ML solution Take 60 mls by mouth four times a day 04/16/14  Yes Lori P Hvozdovic, PA-C  Polyethyl Glycol-Propyl Glycol (SYSTANE OP) Apply 1 drop to eye as needed  (dry eyes).   Yes Historical Provider, MD  potassium chloride SA (K-DUR,KLOR-CON) 20 MEQ tablet Take 10 mEq by mouth 2 (two) times daily.   Yes Historical Provider, MD  thiamine (VITAMIN B-1) 50 MG tablet Take 50 mg by mouth daily.   Yes Historical Provider, MD  TIVICAY 50 MG tablet TAKE 1 TABLET BY MOUTH EVERY DAY 05/22/14  Yes Truman Hayward, MD  TRUVADA 200-300 MG per tablet TAKE 1 TABLET BY MOUTH EVERY DAY 05/16/14  Yes Truman Hayward, MD  XIFAXAN 550 MG TABS tablet TAKE 1 TABLET BY MOUTH TWICE DAILY 10/18/13  Yes Gatha Mayer, MD  acyclovir (ZOVIRAX) 400 MG tablet Take 400 mg by mouth 2 (two) times daily as needed (cold sores).  10/29/11   Historical Provider, MD  furosemide (LASIX) 40 MG tablet TAKE 1 TABLET BY MOUTH TWICE DAILY Patient not taking: Reported on 06/11/2014 05/22/14   Gatha Mayer, MD   BP 140/85 mmHg  Pulse 74  Temp(Src) 98.9 F (37.2 C) (Oral)  Resp 16  SpO2 99% Physical Exam  Constitutional: He appears well-developed and well-nourished.  Obese male lying in exam bed, appears mildly fatigued.  HENT:  Head: Normocephalic and atraumatic.  Eyes: Conjunctivae are normal. No scleral icterus.  Neck: Normal range of motion.  Cardiovascular: Normal rate, regular rhythm and normal heart sounds.   Pulmonary/Chest: Effort normal and breath sounds normal. No respiratory distress. He has no wheezes. He has no rales. He exhibits no tenderness.  Abdominal: Soft. Bowel sounds are normal. He exhibits no distension and no mass. There is no tenderness. There is no rebound and no guarding.  Musculoskeletal: Normal range of motion. He exhibits edema and tenderness.  Right ankle and foot: no obvious deformity. Mild to moderate edema of ankle and dorsum of foot. FROM right ankle and all 5 toes. Mild tenderness to dorsum of foot. 5/5 strength with plantarflexion and dorsiflexion compared to left.  No calf tenderness  Neurological: He is alert.  Pt is alert but speech is slurred,  slowed thought process. Occasionally off topic. Gait: mildly unsteady but able to ambulate w/o assistance.   Skin: Skin is warm and dry.  Skin in tact. No ecchymosis, erythema, or warmth. No red streaking, induration, or evidence of underlying infection  Psychiatric: His speech is delayed, tangential and slurred. He is slowed.  Nursing note and vitals reviewed.   ED Course  Procedures (including critical care time) Labs Review Labs Reviewed  CBC WITH DIFFERENTIAL - Abnormal; Notable for the following:    RDW 16.5 (*)    Platelets 76 (*)  All other components within normal limits  BASIC METABOLIC PANEL - Abnormal; Notable for the following:    Potassium 3.2 (*)    Glucose, Bld 105 (*)    All other components within normal limits  AMMONIA - Abnormal; Notable for the following:    Ammonia 89 (*)    All other components within normal limits  HEPATIC FUNCTION PANEL - Abnormal; Notable for the following:    AST 39 (*)    Alkaline Phosphatase 132 (*)    Total Bilirubin 1.7 (*)    Bilirubin, Direct 0.4 (*)    Indirect Bilirubin 1.3 (*)    All other components within normal limits    Imaging Review Dg Ankle Complete Right  06/11/2014   CLINICAL DATA:  Injured ankle standing up from a chair 2 weeks ago. Swelling.  EXAM: RIGHT ANKLE - COMPLETE 3+ VIEW  COMPARISON:  05/13/2010  FINDINGS: There is regional soft tissue swelling. There is probably a small amount of joint fluid. No fracture or dislocation of the ankle joint.  IMPRESSION: No fracture or dislocation.   Electronically Signed   By: Nelson Chimes M.D.   On: 06/11/2014 12:45   Dg Foot Complete Right  06/11/2014   CLINICAL DATA:  Acute right foot pain and swelling while attempting to stand from chair at home.  EXAM: RIGHT FOOT COMPLETE - 3+ VIEW  COMPARISON:  May 13, 2010.  FINDINGS: Mildly displaced fracture with intra-articular extension is seen involving the proximal base of the second proximal phalanx. It appears to be  mildly comminuted, closed and posttraumatic. No other fracture or dislocation is noted.  IMPRESSION: Mildly displaced and comminuted fracture seen involving the proximal base of the second proximal phalanx. This is the initial encounter.   Electronically Signed   By: Sabino Dick M.D.   On: 06/11/2014 12:45     EKG Interpretation None      MDM   Final diagnoses:  Toe fracture, right, closed, initial encounter  Confusion  Increased ammonia level  Constipation, unspecified constipation type    Pt is a 48yo male with hx of hepatic encephalopathy, brought to ED by mother with concern pt has elevated ammonia level as pt is more confused than his baseline.  On exam, pt does have slurred and slowed speech but able to follow 2 step commands, mildly unsteady gait. Pt claims due to lower back pain and right foot pain.   1:27PM: Consulted with [facility], Tye Savoy, ACNP-BC, about pt, who agreed to send someone to come see pt if he is admitted or have him f/u in office next week.  Discussed pt with Dr. Ashok Cordia who believes pt is stable enough for discharge home once restarted on lactulose, will give a dose in ED.  2:28 PM Pt drinking PO fluids, just finished taking lactulose.  Pt appears more awake and alert. Pt and mother are comfortable and agree with plan for discharge home to f/u with [facility]. Return precautions provided. Pt verbalized understanding and agreement with tx plan.    Noland Fordyce, PA-C 06/11/14 [place], MD 06/12/14 [phone]

## 2014-06-17 ENCOUNTER — Other Ambulatory Visit: Payer: Medicaid Other

## 2014-06-17 ENCOUNTER — Encounter (HOSPITAL_COMMUNITY): Payer: Self-pay | Admitting: Emergency Medicine

## 2014-06-17 ENCOUNTER — Emergency Department (HOSPITAL_COMMUNITY)
Admission: EM | Admit: 2014-06-17 | Discharge: 2014-06-17 | Disposition: A | Discharge status: Home / Self Care | Payer: Medicaid Other | Attending: Emergency Medicine | Admitting: Emergency Medicine

## 2014-06-17 ENCOUNTER — Telehealth: Payer: Self-pay | Admitting: Internal Medicine

## 2014-06-17 DIAGNOSIS — G9009 Other idiopathic peripheral autonomic neuropathy: Secondary | ICD-10-CM | POA: Diagnosis not present

## 2014-06-17 DIAGNOSIS — Z21 Asymptomatic human immunodeficiency virus [HIV] infection status: Secondary | ICD-10-CM | POA: Insufficient documentation

## 2014-06-17 DIAGNOSIS — Z8709 Personal history of other diseases of the respiratory system: Secondary | ICD-10-CM | POA: Insufficient documentation

## 2014-06-17 DIAGNOSIS — I10 Essential (primary) hypertension: Secondary | ICD-10-CM | POA: Insufficient documentation

## 2014-06-17 DIAGNOSIS — E669 Obesity, unspecified: Secondary | ICD-10-CM | POA: Diagnosis not present

## 2014-06-17 DIAGNOSIS — Z79899 Other long term (current) drug therapy: Secondary | ICD-10-CM | POA: Insufficient documentation

## 2014-06-17 DIAGNOSIS — F419 Anxiety disorder, unspecified: Secondary | ICD-10-CM | POA: Insufficient documentation

## 2014-06-17 DIAGNOSIS — R109 Unspecified abdominal pain: Secondary | ICD-10-CM | POA: Diagnosis present

## 2014-06-17 DIAGNOSIS — K219 Gastro-esophageal reflux disease without esophagitis: Secondary | ICD-10-CM | POA: Insufficient documentation

## 2014-06-17 DIAGNOSIS — F329 Major depressive disorder, single episode, unspecified: Secondary | ICD-10-CM | POA: Diagnosis not present

## 2014-06-17 DIAGNOSIS — Z85828 Personal history of other malignant neoplasm of skin: Secondary | ICD-10-CM | POA: Diagnosis not present

## 2014-06-17 DIAGNOSIS — Z880 Allergy status to penicillin: Secondary | ICD-10-CM | POA: Diagnosis not present

## 2014-06-17 DIAGNOSIS — Z87891 Personal history of nicotine dependence: Secondary | ICD-10-CM | POA: Insufficient documentation

## 2014-06-17 DIAGNOSIS — D509 Iron deficiency anemia, unspecified: Secondary | ICD-10-CM | POA: Insufficient documentation

## 2014-06-17 DIAGNOSIS — Z87828 Personal history of other (healed) physical injury and trauma: Secondary | ICD-10-CM | POA: Insufficient documentation

## 2014-06-17 DIAGNOSIS — Z8781 Personal history of (healed) traumatic fracture: Secondary | ICD-10-CM | POA: Diagnosis not present

## 2014-06-17 DIAGNOSIS — K5900 Constipation, unspecified: Secondary | ICD-10-CM | POA: Diagnosis not present

## 2014-06-17 DIAGNOSIS — Z87448 Personal history of other diseases of urinary system: Secondary | ICD-10-CM | POA: Diagnosis not present

## 2014-06-17 LAB — COMPREHENSIVE METABOLIC PANEL
ALK PHOS: 142 U/L — AB (ref 39–117)
ALT: 27 U/L (ref 0–53)
AST: 41 U/L — AB (ref 0–37)
Albumin: 4 g/dL (ref 3.5–5.2)
Anion gap: 6 (ref 5–15)
BUN: 9 mg/dL (ref 6–23)
CALCIUM: 8.7 mg/dL (ref 8.4–10.5)
CO2: 25 mmol/L (ref 19–32)
Chloride: 108 mEq/L (ref 96–112)
Creatinine, Ser: 0.89 mg/dL (ref 0.50–1.35)
GLUCOSE: 118 mg/dL — AB (ref 70–99)
POTASSIUM: 3.5 mmol/L (ref 3.5–5.1)
SODIUM: 139 mmol/L (ref 135–145)
Total Bilirubin: 1.4 mg/dL — ABNORMAL HIGH (ref 0.3–1.2)
Total Protein: 7 g/dL (ref 6.0–8.3)

## 2014-06-17 LAB — CBC WITH DIFFERENTIAL/PLATELET
Basophils Absolute: 0 10*3/uL (ref 0.0–0.1)
Basophils Relative: 1 % (ref 0–1)
EOS ABS: 0.1 10*3/uL (ref 0.0–0.7)
EOS PCT: 4 % (ref 0–5)
HEMATOCRIT: 39.4 % (ref 39.0–52.0)
HEMOGLOBIN: 13 g/dL (ref 13.0–17.0)
Lymphocytes Relative: 24 % (ref 12–46)
Lymphs Abs: 0.9 10*3/uL (ref 0.7–4.0)
MCH: 28.3 pg (ref 26.0–34.0)
MCHC: 33 g/dL (ref 30.0–36.0)
MCV: 85.7 fL (ref 78.0–100.0)
MONO ABS: 0.3 10*3/uL (ref 0.1–1.0)
MONOS PCT: 9 % (ref 3–12)
Neutro Abs: 2.3 10*3/uL (ref 1.7–7.7)
Neutrophils Relative %: 63 % (ref 43–77)
PLATELETS: 68 10*3/uL — AB (ref 150–400)
RBC: 4.6 MIL/uL (ref 4.22–5.81)
RDW: 16.8 % — ABNORMAL HIGH (ref 11.5–15.5)
WBC: 3.6 10*3/uL — ABNORMAL LOW (ref 4.0–10.5)

## 2014-06-17 LAB — AMMONIA: AMMONIA: 68 umol/L — AB (ref 11–32)

## 2014-06-17 LAB — LIPASE, BLOOD: Lipase: 23 U/L (ref 11–59)

## 2014-06-17 MED ORDER — POLYETHYLENE GLYCOL 3350 17 GM/SCOOP PO POWD: 17.0000 g | Freq: Every day | ORAL | Status: DC

## 2014-06-17 NOTE — ED Notes (Signed)
Pt with Hx of hepatic cirrhosis reports to ED for abdominal, constipation x 3 days, pt self-treated with an enema, no success. Pt states his ammonia was high 2 weeks ago.

## 2014-06-17 NOTE — Progress Notes (Signed)
Pt assisted to restroom after the encouragement of his mother (came to nursing station to inquire if pt needs a urine specimen)  Pt voided and placed urine specimen on table in his room.  Pt walked to Restroom with Cm with standby assistance pt slow with movement Pt wasted first urine on the floor of the bathroom. Cleaned up by environmental.

## 2014-06-17 NOTE — ED Provider Notes (Signed)
CSN: [id number]     Arrival date & time 06/17/14  1206 History   First MD Initiated Contact with Patient 06/17/14 1214     Chief Complaint  Patient presents with  . Abdominal Pain      HPI Patient is brought to the emergency department by family for concern of constipation and possible abdominal distention over the past 3 days.  No reports of nausea or vomiting.  Patient has tried enemas without relief.  Family is concerned that his ammonia level could be elevated as this has occurred before in the past with elevated ammonia levels.  Family reports some mild confusion but no significant change.  No reports of fevers or chills.  No history of small bowel obstruction.  Patient has been otherwise compliant with his medications including his lactulose.   Past Medical History  Diagnosis Date  . HIV DISEASE 06/23/2006  . HYPERLIPIDEMIA 06/23/2006  . ANXIETY 06/23/2006  . DEPRESSION 06/23/2006  . HYPERTENSION 06/23/2006  . 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 Ascites 11/13/2009  . STRAIN, CHEST WALL 03/15/2007  . 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 Gastric ulcer 06/2010  . Alcoholic cirrhosis of liver   . Alcoholism, chronic   . Iron deficiency anemia   . GERD (gastroesophageal reflux disease)   . SBP (spontaneous bacterial peritonitis) 05/03/2011    Suspected by high leukocytes on paracentesis. Clinical scenario also compatible. November 2012 responded to Levaquin. Started on trimethoprim-sulfamethoxazole double strength daily for prophylaxis.   . Obesity (BMI 30-39.9)     BMI 34 kg/m^2  . Left foot infection   . Ascites   . Portal hypertensive gastropathy 01/02/2013  . Blood dyscrasia     HIV  . Skin cancer     left calf  . 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 Avulsion fracture of middle phalanges of  3rd/4th fingers left hand 01/07/2014   Past Surgical History  Procedure Laterality  Date  . Esophagogastroduodenoscopy w/ banding  06/26/2010    variceal ligation  . Carpal tunnel release      left  . Esophagogastroduodenoscopy  07/01/2010;  08/12/10    small varices, gastric ulcer  . Esophagogastroduodenoscopy  10/26/2011    Procedure: ESOPHAGOGASTRODUODENOSCOPY (EGD);  Surgeon: Gatha Mayer, MD;  Location: [facility];  Service: Endoscopy;  Laterality: N/A;  . Upper gastrointestinal endoscopy    . Colonoscopy  march 2013  . Esophagogastroduodenoscopy N/A 01/02/2013    Procedure: ESOPHAGOGASTRODUODENOSCOPY (EGD);  Surgeon: Gatha Mayer, MD;  Location: [facility];  Service: Endoscopy;  Laterality: N/A;  . Gastric varices banding N/A 01/02/2013    Procedure: GASTRIC VARICES BANDING;  Surgeon: Gatha Mayer, MD;  Location: [facility];  Service: Endoscopy;  Laterality: N/A;  possible banding  . Esophagogastroduodenoscopy N/A 04/23/2013    Procedure: ESOPHAGOGASTRODUODENOSCOPY (EGD);  Surgeon: Gatha Mayer, MD;  Location: [facility];  Service: Endoscopy;  Laterality: N/A;  . Esophagogastroduodenoscopy N/A 05/01/2013    Procedure: ESOPHAGOGASTRODUODENOSCOPY (EGD);  Surgeon: Gatha Mayer, MD;  Location: [facility];  Service: Endoscopy;  Laterality: N/A;  follow-up varices and possibly band them  . Esophageal banding  05/01/2013    Procedure: ESOPHAGEAL BANDING;  Surgeon: Gatha Mayer, MD;  Location: [facility];  Service: Endoscopy;;  . Esophagogastroduodenoscopy N/A 09/04/2013    Procedure: ESOPHAGOGASTRODUODENOSCOPY (EGD);  Surgeon: Gatha Mayer, MD;  Location: [facility];  Service: Endoscopy;  Laterality: N/A;   Family History  Problem Relation  Age of Onset  . Hyperlipidemia Mother   . Hypertension Mother   . Breast cancer Mother     questionable  . Heart disease Mother   . Hypertension Father   . Irritable bowel syndrome Father   . Drug abuse Brother   . Heart disease Brother   . Breast cancer Maternal Aunt     maternal great aunt  . Colon cancer  Neg Hx   . Alcohol abuse Other   . Heart disease Maternal Uncle   . Irritable bowel syndrome Paternal Aunt    History  Substance Use Topics  . Smoking status: Former Smoker -- 0.10 packs/day for 10 years    Types: Cigarettes    Start date: 03/03/2014  . Smokeless tobacco: Never Used     Comment: form given 05-19-11  . Alcohol Use: No     Comment: pt is an alcoholic currently in [place] meetings    Review of Systems  All other systems reviewed and are negative.     Allergies  Penicillins  Home Medications   Prior to Admission medications   Medication Sig Start Date End Date Taking? Authorizing Provider  acyclovir (ZOVIRAX) 400 MG tablet Take 400 mg by mouth 2 (two) times daily as needed (cold sores).  10/29/11  Yes Historical Provider, MD  ALPRAZolam Duanne Moron) 1 MG tablet Take 1 tablet (1 mg total) by mouth 2 (two) times daily. 04/22/14  Yes Gatha Mayer, MD  aMILoride (MIDAMOR) 5 MG tablet Take 10 mg by mouth every morning.    Yes Historical Provider, MD  amitriptyline (ELAVIL) 100 MG tablet Take 1 tablet (100 mg total) by mouth at bedtime. 04/22/14  Yes Gatha Mayer, MD  B Complex-C (B-COMPLEX WITH VITAMIN C) tablet Take 1 tablet by mouth at bedtime.    Yes Historical Provider, MD  esomeprazole (NEXIUM) 40 MG capsule Take 1 capsule (40 mg total) by mouth daily before breakfast. 12/03/13  Yes Gatha Mayer, MD  furosemide (LASIX) 40 MG tablet TAKE 1 TABLET BY MOUTH TWICE DAILY   Yes Gatha Mayer, MD  lactulose [facility]) 10 GM/15ML solution Take 60 mls by mouth four times a day 04/16/14  Yes Lori P Hvozdovic, PA-C  Polyethyl Glycol-Propyl Glycol (SYSTANE OP) Apply 1 drop to eye as needed (dry eyes).   Yes Historical Provider, MD  potassium chloride SA (K-DUR,KLOR-CON) 20 MEQ tablet Take 10 mEq by mouth 2 (two) times daily.   Yes Historical Provider, MD  sodium phosphate (FLEET) enema Place 1 enema rectally once. follow package directions   Yes Historical Provider, MD   TIVICAY 50 MG tablet TAKE 1 TABLET BY MOUTH EVERY DAY 05/22/14  Yes Truman Hayward, MD  TRUVADA 200-300 MG per tablet TAKE 1 TABLET BY MOUTH EVERY DAY 05/16/14  Yes Truman Hayward, MD  XIFAXAN 550 MG TABS tablet TAKE 1 TABLET BY MOUTH TWICE DAILY 10/18/13  Yes Gatha Mayer, MD  furosemide (LASIX) 40 MG tablet TAKE 1 TABLET BY MOUTH TWICE DAILY Patient not taking: Reported on 06/11/2014 05/22/14   Gatha Mayer, MD  polyethylene glycol powder (MIRALAX) powder Take 17 g by mouth daily. 06/17/14   Hoy Morn, MD   BP 133/67 mmHg  Pulse 70  Temp(Src) 97.4 F (36.3 C) (Oral)  Resp 18  SpO2 100% Physical Exam  Constitutional: He is oriented to person, place, and time. He appears well-developed and well-nourished.  HENT:  Head: Normocephalic and atraumatic.  Eyes: EOM are normal.  Neck: Normal range of motion.  Cardiovascular: Normal rate, regular rhythm and normal heart sounds.   Pulmonary/Chest: Effort normal and breath sounds normal. No respiratory distress.  Abdominal: Soft. He exhibits no distension. There is no tenderness.  Musculoskeletal: Normal range of motion.  Neurological: He is alert and oriented to person, place, and time.  Skin: Skin is warm and dry.  Psychiatric: He has a normal mood and affect. Judgment normal.  Nursing note and vitals reviewed.   ED Course  Procedures (including critical care time) Labs Review Labs Reviewed  CBC WITH DIFFERENTIAL - Abnormal; Notable for the following:    WBC 3.6 (*)    RDW 16.8 (*)    Platelets 68 (*)    All other components within normal limits  COMPREHENSIVE METABOLIC PANEL - Abnormal; Notable for the following:    Glucose, Bld 118 (*)    AST 41 (*)    Alkaline Phosphatase 142 (*)    Total Bilirubin 1.4 (*)    All other components within normal limits  AMMONIA - Abnormal; Notable for the following:    Ammonia 68 (*)    All other components within normal limits  LIPASE, BLOOD    Imaging Review No results  found.   EKG Interpretation None      MDM   Final diagnoses:  Constipation, unspecified constipation type    Family is concerned about the possibility of an elevated pneumonia however it is not significantly changed from before and actually slightly better.  His abdomen is benign on examination.  His vital signs are normal.  His mentation seems rather normal to me.  I will asked the patient to follow-up with his primary care physician and his gastroenterologist.  Relaxants for constipation.    Hoy Morn, MD 06/17/14 [phone]

## 2014-06-17 NOTE — Telephone Encounter (Signed)
Mother called to report that patient is lethargic, he is arousable to her, but must be awakened to eat and take meds,  He has not had a BM since D/C from the ER on 06/11/14.  She gives him an enema nightly, but not sure if there are results.  She states she is giving him his lactulose 4 times a day but he is not having results.  He is confused and "worse than he was last week".  She is advised to take him to the ER now.  I have notified Alonza Bogus, PA that patient was instructed to go to the ER.

## 2014-06-20 ENCOUNTER — Ambulatory Visit: Payer: Medicaid Other | Admitting: Physician Assistant

## 2014-06-20 ENCOUNTER — Encounter: Payer: Self-pay | Admitting: Internal Medicine

## 2014-06-20 ENCOUNTER — Telehealth: Payer: Self-pay | Admitting: Internal Medicine

## 2014-06-20 ENCOUNTER — Ambulatory Visit (INDEPENDENT_AMBULATORY_CARE_PROVIDER_SITE_OTHER): Payer: Medicaid Other | Admitting: Internal Medicine

## 2014-06-20 VITALS — BP 126/76 | HR 76 | Ht 70.75 in | Wt 287.0 lb

## 2014-06-20 DIAGNOSIS — K5909 Other constipation: Secondary | ICD-10-CM

## 2014-06-20 DIAGNOSIS — W19XXXD Unspecified fall, subsequent encounter: Secondary | ICD-10-CM

## 2014-06-20 DIAGNOSIS — F411 Generalized anxiety disorder: Secondary | ICD-10-CM

## 2014-06-20 DIAGNOSIS — K7290 Hepatic failure, unspecified without coma: Secondary | ICD-10-CM

## 2014-06-20 DIAGNOSIS — K7031 Alcoholic cirrhosis of liver with ascites: Secondary | ICD-10-CM

## 2014-06-20 DIAGNOSIS — G9009 Other idiopathic peripheral autonomic neuropathy: Secondary | ICD-10-CM

## 2014-06-20 DIAGNOSIS — K7682 Hepatic encephalopathy: Secondary | ICD-10-CM

## 2014-06-20 NOTE — Assessment & Plan Note (Addendum)
This is why he is falling ? Needs neuro evaluation also Await CT abd first  2014 brain MRI with following findings Increased signal within the globus pallidus bilaterally and cerebral peduncles. This is in a distribution which has been described with hepatic encephalopathy.  Questionable increased signal in the peri aqueductal region. This can be seen with alcoholic encephalopathy (Wernicke's encephalopathy).

## 2014-06-20 NOTE — Assessment & Plan Note (Addendum)
Check CT due to deterioration/encephalopathy worsening, increasing abdominal girth (probably fat and not ascites) and hepatocellular carcinoma risk

## 2014-06-20 NOTE — Assessment & Plan Note (Addendum)
Neuropathy seems to be culprit - may need repeat brain imaging or neuro input though doubt there is Tx . See falls and MRI brain 2014 report Will discuss PT/assistance/home safety possibilities with PCP i.e. What is possible through [facility]

## 2014-06-20 NOTE — Patient Instructions (Addendum)
You have been scheduled for a CT scan of the abdomen and pelvis at [facility] ([address]---this is in the same building as [facility]).   You are scheduled on 06/24/14 at 10:00am. You should arrive 15 minutes prior to your appointment time for registration. Please follow the written instructions below on the day of your exam:  WARNING: IF YOU ARE ALLERGIC TO IODINE/X-RAY DYE, PLEASE NOTIFY RADIOLOGY IMMEDIATELY AT [phone]! YOU WILL BE GIVEN A 13 HOUR PREMEDICATION PREP.  1) Do not eat or drink anything after 6:00am (4 hours prior to your test) 2) You have been given 2 bottles of oral contrast to drink. The solution may taste  better if refrigerated, but do NOT add ice or any other liquid to this solution. Shake  well before drinking.    Drink 1 bottle of contrast @ 8:00am (2 hours prior to your exam)  Drink 1 bottle of contrast @ 9:00am (1 hour prior to your exam)  You may take any medications as prescribed with a small amount of water except for the following: Metformin, Glucophage, Glucovance, Avandamet, Riomet, Fortamet, Actoplus Met, Janumet, Glumetza or Metaglip. The above medications must be held the day of the exam AND 48 hours after the exam.  The purpose of you drinking the oral contrast is to aid in the visualization of your intestinal tract. The contrast solution may cause some diarrhea. Before your exam is started, you will be given a small amount of fluid to drink. Depending on your individual set of symptoms, you may also receive an intravenous injection of x-ray contrast/dye. Plan on being at [facility] for 30 minutes or long, depending on the type of exam you are having performed.  This test typically takes 30-45 minutes to complete.  If you have any questions regarding your exam or if you need to reschedule, you may call the CT department at [phone] between the hours of 8:00 am and 5:00 pm,  Monday-Friday.  ________________________________________________________________________  Please measure the amount of Lactulose your taking.  I appreciate the opportunity to care for you. Silvano Rusk, M.D., [facility]

## 2014-06-20 NOTE — Progress Notes (Addendum)
   Subjective:    Patient ID: [name], male    DOB: [birth year], 49 y.o.   MRN: [id number]  HPI [name] is here with his mother. He was in the emergency room 3 days ago because of constipation and MiraLAX was started. He has had the use an enema daily for the past week or so to try to defecate. He also fell and was seen in the ER on 12/29. He struck his coccyx in his back and is having pain. He is also been more confused and constipated though the confusion is better. He has excessive somnolence. His mother is giving him for cupfuls of lactulose a day, he is not sure of the amount but we think is probably more than the 60 mL that is prescribed. She will look into that and let us know. His ammonia level was 68 3 days ago and 89 9 days ago. The mid 60s is kind of his baseline. Bilirubin 1.7 up a little. Albumin 3.9. Hemoglobin is normal. Still tends to have leukopenia and thrombocytopenia though not terrible.  Still taking his Xanax, half a tablet morning half a tablet the middle of the day and 1 tablet at bedtime. He says he needs that to control his anxiety. He is on amitriptyline 100 mg at bedtime for his neuropathy problems.  Medications, allergies, past medical history, past surgical history, family history and social history are reviewed and updated in the EMR.  Review of Systems As per HPI, all other ROS negative    Objective:   Physical Exam General:  Chronically ill Eyes:  anicteric. ENT:   Mouth and posterior pharynx free of lesions.  Neck:   supple w/o thyromegaly or mass.  Lungs: Clear to auscultation bilaterally. Heart:  S1S2, no rubs, murmurs, gallops. Abdomen:  obese, protuberant soft, non-tender, no hepatosplenomegaly, hernia, or mass and BS+.  Rectal: Scanty soft brown stool, no impaction Lymph:  no cervical or supraclavicular adenopathy. Extremities:   no edema Neuro:  Lethargic but A&O x 3., no asterixis slow, shuffling gait, slightly unsteady arising from chair but  does so withouth holding or grabbing Psych:  appropriate mood and  Affect.   Data Reviewed:  ED visit, labs, imaging, prior GI visits w/o me    Assessment & Plan:  Alcoholic cirrhosis of liver Check CT due to deterioration/encephalopathy worsening, increasing abdominal girth (probably fat and not ascites) and hepatocellular carcinoma risk  Idiopathic peripheral autonomic neuropathy This is why he is falling ? Needs neuro evaluation also Await CT   ENCEPHALOPATHY, HEPATIC Alert and oriented and no asterixis today Is lethargic Constipated despite large doses of lactulose, and even MiraLax - may need a purge Xanax +/- amitriptyline probably part of problem but anxiety and neuropathy co-morbidities exist and are treated  Anxiety state May need to reduce/wean alprazolam but this has proved difficult if not impossible  Falls Neuropathy seems to be culprit - may need brain imaging or neuro input though doubt there is Tx Will discuss PT/assistance/home safety possibilities with PCP i.e. What is possible through [facility]

## 2014-06-20 NOTE — Telephone Encounter (Signed)
Spoke with mom Enid Derry who reports that Horris is taking 15 tablespoons four times a day of Lactulose.

## 2014-06-20 NOTE — Assessment & Plan Note (Signed)
Alert and oriented and no asterixis today Is lethargic Constipated despite large doses of lactulose, and even MiraLax - may need a purge Xanax +/- amitriptyline probably part of problem but anxiety and neuropathy co-morbidities exist and are treated

## 2014-06-20 NOTE — Assessment & Plan Note (Signed)
May need to reduce/wean alprazolam but this has proved difficult if not impossible

## 2014-06-24 ENCOUNTER — Ambulatory Visit (INDEPENDENT_AMBULATORY_CARE_PROVIDER_SITE_OTHER)
Admission: RE | Admit: 2014-06-24 | Discharge: 2014-06-24 | Disposition: A | Discharge status: Home / Self Care | Payer: Medicaid Other | Source: Ambulatory Visit | Attending: Internal Medicine | Admitting: Internal Medicine

## 2014-06-24 ENCOUNTER — Telehealth: Payer: Self-pay | Admitting: Internal Medicine

## 2014-06-24 DIAGNOSIS — K7031 Alcoholic cirrhosis of liver with ascites: Secondary | ICD-10-CM

## 2014-06-24 DIAGNOSIS — W19XXXA Unspecified fall, initial encounter: Secondary | ICD-10-CM

## 2014-06-24 DIAGNOSIS — G629 Polyneuropathy, unspecified: Secondary | ICD-10-CM

## 2014-06-24 MED ORDER — IOHEXOL 300 MG/ML  SOLN
100.0000 mL | Freq: Once | INTRAMUSCULAR | Status: AC | PRN
  Administered 2014-06-24: 100 mL via INTRAVENOUS

## 2014-06-24 MED ORDER — PEG 3350-KCL-NA BICARB-NACL 420 G PO SOLR: ORAL | Status: DC

## 2014-06-24 NOTE — Telephone Encounter (Signed)
PJ do you know anything about this?

## 2014-06-24 NOTE — Progress Notes (Signed)
Quick Note:  I called results to mom.  He is still constipated  1) Rx Colyte or Golytely and drink entire container over 4 hrs 2) Mom to call back with results later this week 3) Needs neurology evaluation re: neuropathy and falls Dr. Carles Collet or colleagues  4) may need PCP approval ([facility]) for that consult     I ______

## 2014-06-24 NOTE — Telephone Encounter (Signed)
I spoke with the patient's mother and explained prep.  She will call back for any additional questions or concerns

## 2014-06-25 ENCOUNTER — Telehealth: Payer: Self-pay | Admitting: Internal Medicine

## 2014-06-25 NOTE — Telephone Encounter (Signed)
I have assured her that he will have results soon.  She is asked to continue to give him clear liquids as tolerated.

## 2014-06-25 NOTE — Telephone Encounter (Signed)
I spoke with patient's mom all questions answered about the prep that was called in last night.  She will call back for any additional questions or concerns

## 2014-06-26 ENCOUNTER — Other Ambulatory Visit: Payer: Self-pay | Admitting: Infectious Disease

## 2014-06-26 DIAGNOSIS — B20 Human immunodeficiency virus [HIV] disease: Secondary | ICD-10-CM

## 2014-07-01 ENCOUNTER — Ambulatory Visit: Payer: Medicaid Other | Admitting: Infectious Disease

## 2014-07-01 ENCOUNTER — Other Ambulatory Visit: Payer: Self-pay

## 2014-07-01 MED ORDER — LACTULOSE 10 GM/15ML PO SOLN: ORAL | Status: DC

## 2014-07-02 ENCOUNTER — Telehealth: Payer: Self-pay

## 2014-07-02 MED ORDER — LACTULOSE 10 GM/15ML PO SOLN: ORAL | Status: DC

## 2014-07-02 NOTE — Telephone Encounter (Signed)
Spoke with mom Enid Derry) and clarified where Zorion's lactulose was to be sent.  Refilled it yesterday to [facility] in [place].  Today got request from [place].  Mom said it should go to [facility] so I called and cancelled it at [facility] and have re-sent it to [place].

## 2014-07-03 ENCOUNTER — Ambulatory Visit (INDEPENDENT_AMBULATORY_CARE_PROVIDER_SITE_OTHER): Payer: Medicaid Other | Admitting: Infectious Disease

## 2014-07-03 ENCOUNTER — Encounter: Payer: Self-pay | Admitting: Infectious Disease

## 2014-07-03 VITALS — Wt 291.0 lb

## 2014-07-03 DIAGNOSIS — B20 Human immunodeficiency virus [HIV] disease: Secondary | ICD-10-CM

## 2014-07-03 MED ORDER — DOLUTEGRAVIR SODIUM 50 MG PO TABS: 50.0000 mg | ORAL_TABLET | Freq: Every day | ORAL | Status: DC

## 2014-07-03 MED ORDER — EMTRICITABINE-TENOFOVIR DF 200-300 MG PO TABS: 1.0000 | ORAL_TABLET | Freq: Every day | ORAL | Status: DC

## 2014-07-03 NOTE — Progress Notes (Signed)
Subjective:    Patient ID: [name], male    DOB: 1966/04/20, 49 y.o.   MRN: [id number]  HPI   [name] is a 49 y.o. male with HIV infection, cirrhosis,  who had been  doing superbly well on his antiviral regimen ofTivicay and Truvada and his VL remains suppressed and CD4 is above 200.  He is being evaluated at [facility] for possible transplant. He is having trouble falling at times as documented in Dr. Celesta Aver note.  (I suspect this may be due to his sedatives)   Today pt has again slurred speech and I wonder about his ativan dosing which he says he is trying to adjust to reduce chances of him being overly sleepy.    Review of Systems  Constitutional: Negative for fever, chills, diaphoresis and unexpected weight change.  HENT: Negative for congestion, rhinorrhea, sinus pressure, sneezing, sore throat and trouble swallowing.   Eyes: Negative for photophobia and visual disturbance.  Respiratory: Negative for cough, chest tightness, shortness of breath, wheezing and stridor.   Cardiovascular: Negative for chest pain, palpitations and leg swelling.  Gastrointestinal: Negative for nausea, vomiting, abdominal pain, diarrhea, constipation, blood in stool, abdominal distention and anal bleeding.  Genitourinary: Negative for dysuria, hematuria, flank pain and difficulty urinating.  Musculoskeletal: Negative for myalgias, back pain, joint swelling, arthralgias and gait problem.  Skin: Negative for color change, pallor, rash and wound.  Neurological: Negative for dizziness, tremors, seizures, weakness and light-headedness.  Hematological: Negative for adenopathy. Does not bruise/bleed easily.  Psychiatric/Behavioral: Negative for behavioral problems, confusion, sleep disturbance, dysphoric mood, decreased concentration and agitation. The patient is not nervous/anxious.        Objective:   Physical Exam  Constitutional: He is oriented to person, place, and time. He appears  well-developed and well-nourished. No distress.  HENT:  Head: Normocephalic and atraumatic.  Mouth/Throat: Oropharynx is clear and moist. No oropharyngeal exudate.  Eyes: Conjunctivae and EOM are normal. Pupils are equal, round, and reactive to light. No scleral icterus.  Neck: Normal range of motion. Neck supple. No JVD present.  Cardiovascular: Normal rate, regular rhythm and normal heart sounds.  Exam reveals no gallop and no friction rub.   No murmur heard. Pulmonary/Chest: Effort normal and breath sounds normal. No respiratory distress. He has no wheezes. He has no rales. He exhibits no tenderness.  Abdominal: Soft. He exhibits distension. He exhibits no mass. There is no tenderness. There is no rebound and no guarding.  Musculoskeletal: He exhibits no edema or tenderness.  Lymphadenopathy:    He has no cervical adenopathy.  Neurological: He is alert and oriented to person, place, and time. No sensory deficit. He exhibits normal muscle tone. Gait normal.  Skin: Skin is warm and dry. He is not diaphoretic. No erythema. No pallor.  Psychiatric: He has a normal mood and affect. His behavior is normal. Judgment and thought content normal.          Assessment & Plan:   HIV: continue  TIVICAY and Truvada.   I spent greater than 25 minutes with the patient including greater than 50% of time in face to face counsel of the patient and in coordination of their care.   Etoh induced cirrhosis with varices: followed by [facility] and [facility] team.  Anxiety depression: care with benzos given his liver disease.  Gynecomastia: Hopefully this may improve off of Sustiva though it could also be due to his cirrhosis he is going to Pl[facility] [place]  Lipodystrophy changes and morbid obesity : continue exercise but avoid high protein diets.

## 2014-07-15 ENCOUNTER — Encounter: Payer: Self-pay | Admitting: Internal Medicine

## 2014-07-15 ENCOUNTER — Ambulatory Visit (INDEPENDENT_AMBULATORY_CARE_PROVIDER_SITE_OTHER): Payer: Medicaid Other | Admitting: Internal Medicine

## 2014-07-15 VITALS — BP 90/50 | HR 80 | Ht 70.75 in | Wt 290.0 lb

## 2014-07-15 DIAGNOSIS — G9009 Other idiopathic peripheral autonomic neuropathy: Secondary | ICD-10-CM

## 2014-07-15 DIAGNOSIS — K5900 Constipation, unspecified: Secondary | ICD-10-CM

## 2014-07-15 DIAGNOSIS — K7682 Hepatic encephalopathy: Secondary | ICD-10-CM

## 2014-07-15 DIAGNOSIS — F411 Generalized anxiety disorder: Secondary | ICD-10-CM

## 2014-07-15 DIAGNOSIS — K7030 Alcoholic cirrhosis of liver without ascites: Secondary | ICD-10-CM

## 2014-07-15 DIAGNOSIS — K7290 Hepatic failure, unspecified without coma: Secondary | ICD-10-CM

## 2014-07-15 DIAGNOSIS — I8500 Esophageal varices without bleeding: Secondary | ICD-10-CM

## 2014-07-15 DIAGNOSIS — W19XXXD Unspecified fall, subsequent encounter: Secondary | ICD-10-CM

## 2014-07-15 HISTORY — DX: Constipation, unspecified: K59.00

## 2014-07-15 NOTE — Assessment & Plan Note (Signed)
He is coming due for routine repeat endoscopy to follow-up on his varices given his history of bleeding. We will schedule that for march.The risks and benefits as well as alternatives of endoscopic procedure(s) have been discussed and reviewed. All questions answered. The patient agrees to proceed.

## 2014-07-15 NOTE — Progress Notes (Signed)
   Subjective:    Patient ID: [name], male    DOB: [birth year], 49 y.o.   MRN: [id number]  HPI Even is here with his mother. He is here for follow-up of cirrhosis, constipation and hepatic encephalopathy. Additional problems include his peripheral neuropathy, and falls. He is more clear at this time. Colonoscopy prep per seem to help and he is moving his bowels better after that as well as adding prune juice to his regimen. He is on 60 mL of lactulose 4 times a day. He has been ID for follow-up of his HIV in the interim. Unfortunately continues to have some falls. He remains active at the [facility] and does spin classes. He notices he has to get up slowly after exercising and even in the morning because he is less stable then. As a lab twice a day up to 1 mg continued at this point to treat his anxiety symptoms. Medications, allergies, past medical history, past surgical history, family history and social history are reviewed and updated in the EMR.   Review of Systems As per history of present illness    Objective:   Physical Exam BP 90/50 mmHg  Pulse 80  Ht 5' 10.75" (1.797 m)  Wt 290 lb (131.543 kg)  BMI 40.74 kg/m2 Obese pleasant no acute distress Patient is alert and oriented 3 and there is no asterixis visible today Negative Romberg, no pronator drift Eyes are anicteric  Data reviewed: I reviewed recent infectious disease visit. 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 He does have cholelithiasis as well. Mild lower esophageal wall thickening which is probably related to varices versus reflux.    Assessment & Plan:  ENCEPHALOPATHY, HEPATIC Improved, we'll continue lactulose at current dose and Xifaxan. He is taking and Fleet enema when needed for constipation, who seems to be helping.   Alcoholic cirrhosis of liver stable overall it seems. CT scanning did not show any evidence  of tumors is good for 6-12 months on cross-sectional imaging.   Idiopathic peripheral autonomic neuropathy Neuro evaluation    Falls Urology consult is upcoming. I think this is probably multifactorial with if he has peripheral neuropathy, he may have some cerebellar dysfunction, encephalopathy could play a role and he continues to uses alprazolam to treat anxiety, I have really been unsuccessful at weaning that. Perhaps he did try more aggressively on that.   Esophageal varices He is coming due for routine repeat endoscopy to follow-up on his varices given his history of bleeding. We will schedule that for march.The risks and benefits as well as alternatives of endoscopic procedure(s) have been discussed and reviewed. All questions answered. The patient agrees to proceed.    Anxiety state Continues with some intermittent alprazolam use. I have cautioned him to try to minimize this. At some point we may need to wean this. It is been difficulty was anxiety and how long he has been on this.   Constipation Please see plans for hepatic encephalopathy. This is improved also.   CC: Greig Right, MD and Alcide Evener, MD

## 2014-07-15 NOTE — Assessment & Plan Note (Signed)
Please see plans for hepatic encephalopathy. This is improved also.

## 2014-07-15 NOTE — Assessment & Plan Note (Addendum)
stable overall it seems. CT scanning did not show any evidence of tumors is good for 6-12 months on cross-sectional imaging.

## 2014-07-15 NOTE — Assessment & Plan Note (Addendum)
Urology consult is upcoming. I think this is probably multifactorial with if he has peripheral neuropathy, he may have some cerebellar dysfunction, encephalopathy could play a role and he continues to uses alprazolam to treat anxiety, I have really been unsuccessful at weaning that. Perhaps he did try more aggressively on that.

## 2014-07-15 NOTE — Assessment & Plan Note (Signed)
Neuro evaluation

## 2014-07-15 NOTE — Assessment & Plan Note (Signed)
Continues with some intermittent alprazolam use. I have cautioned him to try to minimize this. At some point we may need to wean this. It is been difficulty was anxiety and how long he has been on this.

## 2014-07-15 NOTE — Assessment & Plan Note (Addendum)
Improved, we'll continue lactulose at current dose and Xifaxan. He is taking and Fleet enema when needed for constipation, who seems to be helping.

## 2014-07-15 NOTE — Patient Instructions (Addendum)
  You have been scheduled for an endoscopy. Please follow written instructions given to you at your visit today. If you use inhalers (even only as needed), please bring them with you on the day of your procedure.   I appreciate the opportunity to care for you. Carl Gessner, MD, FACG 

## 2014-07-19 ENCOUNTER — Other Ambulatory Visit: Payer: Self-pay | Admitting: Internal Medicine

## 2014-07-19 NOTE — Telephone Encounter (Signed)
Please advise [name], thank you. 

## 2014-07-19 NOTE — Telephone Encounter (Signed)
Refill x6

## 2014-07-23 ENCOUNTER — Ambulatory Visit: Payer: Medicaid Other | Admitting: Internal Medicine

## 2014-07-26 ENCOUNTER — Telehealth: Payer: Self-pay | Admitting: Neurology

## 2014-07-26 NOTE — Telephone Encounter (Signed)
Called pt to confirm that he wants to cancel his appt for 07/30/14 .

## 2014-07-30 ENCOUNTER — Ambulatory Visit (INDEPENDENT_AMBULATORY_CARE_PROVIDER_SITE_OTHER): Payer: Medicaid Other | Admitting: Neurology

## 2014-07-30 ENCOUNTER — Encounter: Payer: Self-pay | Admitting: Neurology

## 2014-07-30 VITALS — BP 140/84 | HR 82 | Ht 70.75 in | Wt 290.4 lb

## 2014-07-30 DIAGNOSIS — B20 Human immunodeficiency virus [HIV] disease: Secondary | ICD-10-CM

## 2014-07-30 DIAGNOSIS — G621 Alcoholic polyneuropathy: Secondary | ICD-10-CM

## 2014-07-30 LAB — FOLATE: Folate: 13.1 ng/mL

## 2014-07-30 LAB — VITAMIN B12: Vitamin B-12: 1460 pg/mL — ABNORMAL HIGH (ref 211–911)

## 2014-07-30 NOTE — Patient Instructions (Addendum)
1.  Recommend starting balance training 2.  Fall precautions discussed, please start using cane or walking device as needed to help with your balance 3.  Return to clinic as needed  [facility]  Preventing Falls in the [place] are common, often dreaded events in the lives of older people. Aside from the obvious injuries and even death that may result, falls can cause wide-ranging consequences including loss of independence, mental decline, decreased activity, and mobility. Younger people are also at risk of falling, especially those with chronic illnesses and fatigue.  Ways to reduce the risk for falling:  * Examine diet and medications. Warm foods and alcohol dilate blood vessels, which can lead to dizziness when standing. Sleep aids, antidepressants, and pain medications can also increase the likelihood of a fall.  * Get a vison exam. Poor vision, cataracts, and glaucoma increase the chances of falling.  * Check foot gear. Shoes should fit snugly and have a sturdy, nonskid sole and broad, low heel.  * Participate in a physician-approved exercise program to build and maintain muscle strength and improve balance and coordination.  * Increase vitamin D intake. Vitamin D improves muscle strength and increases the amount of calcium the body is able to absorb and deposit in bones.  How to prevent falls from common hazards:  * Floors - Remove all loose wires, cords, and throw rugs. Minimize clutter. Make sure rugs are anchored and smooth. Keep furniture in its usual place.  * Chairs - Use chairs with straight backs, armrests, and firm seats. Add firm cushions to existing pieces to add height.  * Bathroom - Install grab bars and non-skid tape in the tub or shower. Use a bathtub transfer bench or a shower chair with a back support. Use an elevated toilet seat and/or safety rails to assist standing from a low surface. Do not use towel racks or bathroom tissue holders to help you stand.  *  Lighting - Make sure halls, stairways, and entrances are well-lit. Install a night light in your bathroom or hallway. Make sure there is a light switch at the top and bottom of the staircase. Turn lights on if you get up in the middle of the night. Make sure lamps or light switches are within reach of the bed if you have to get up during the night.  * Kitchen - Install non-skid rubber mats near the sink and stove. Clean spills immediately. Store frequently used utensils, pots, and pans between waist and eye level. This helps prevent reaching and bending. Sit when getting things out of the lower cupboards.  * Living room / [place] furniture with wide spaces in between, giving enough room to move around. Establish a route through the living room that gives you something to hold onto as you walk.  * Stairs - Make sure treads, rails, and rugs are secure. Install a rail on both sides of the stairs. If stairs are a threat, it might be helpful to arrange most of your activities on the lower level to reduce the number of times you must climb the stairs.  * Entrances and doorways - Install metal handles on the walls adjacent to the doorknobs of all doors to make it more secure as you travel through the doorway.  Tips for maintaining balance:  * Keep at least one hand free at all times Try using a backpack or fanny pack to hold things rather than carrying them in your hands. Never carry objects  in both hands when walking as this interferes with keeping your balance.  * Attempt to swing both arms from front to back while walking. This might require a conscious effort if Parkinson's disease has diminished your movement. It will, however, help you to maintain balance and posture, and reduce fatigue.  * Consciously lift your feet off the ground when walking. Shuffling and dragging of the feet is a common culprit in losing your balance.  * When trying to navigate turns, use a "U" technique of facing forward and  making a wide turn, rather than pivoting sharply.  * Try to stand with your feet shoulder-length apart. When your feet are close together for any length of time, you increase your risk of losing your balance and falling.  * Do one thing at a time. Do not try to walk and accomplish another task, such as reading or looking around. The decrease in your automatic reflexes complicates motor function, so the less distraction, the better.  * Do not wear rubber or gripping soled shoes, they might "catch" on the floor and cause tripping.  * Move slowly when changing positions. Use deliberate, concentrated movements and, if needed, use a grab bar or walking aid. Count fifteen (15) seconds after standing to begin walking.  * If balance is a continuous problem, you might want to consider a walking aid such as a cane, walking stick, or walker. Once you have mastered walking with help, you may be ready to try it again on your own.  This information is provided by [facility] and is not intended to replace the medical advice of your physician or other health care providers. Please consult your physician or other health care providers for advice regarding your specific medical condition.

## 2014-07-30 NOTE — Telephone Encounter (Signed)
See previous documentation. Apparently pt changed his mind, he did come in on 07/30/14 to be seen  Carl Jarvis S.

## 2014-07-30 NOTE — Progress Notes (Signed)
[facility] Clinic Note - Initial Visit   Date: 07/30/2014   GEAROLD WAINER MRN: [id number] DOB: [birth year]   Dear Dr. Carolynn Comment:  Thank you for your kind referral of JOHNATHIN VANDERSCHAAF for consultation of falls. Although his history is well known to you, please allow [place] to reiterate it for the purpose of our medical record. The patient was accompanied to the clinic by mother who also provides collateral information.     History of Present Illness: DACOTAH CABELLO is a 49 y.o. 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    He has constant numbness and tinling  of the feet, which has been there for many years.  Starting around 2014, he started feeling off balance starting falling once every few weeks.  He has not sustained any significant injuries and is able to stand to raise himself.  Most of his falls seems to be on uneven surfaces.  He endorses preceding lightheadedness especially with quick changes in position.  He is trying to loose weight and is active at the [facility] doing spin classes.    He is taking lactulose for hepatic encephalopathy.  He is HIV positive which seems to be under good control and reports on being on HAART for several years.  No recent medication changes.  He was fifth of liquor daily for at least 13 years and stopped drinking in 2014.  Out-side paper records, electronic medical record, and images have been reviewed where available and summarized as:  Lab Results  Component Value Date   TSH 0.48 01/07/2014   Lab Results  Component Value Date   VITAMINB12 1799* 05/30/2013   Component     Latest Ref Rng 06/17/2014  Ammonia     11 - 32 umol/L 68 (H)   MRI brain wo contrast 05/29/2013: Increased signal within the globus pallidus  bilaterally and cerebral peduncles. This is in a distribution which has been described with hepatic encephalopathy. Questionable increased signal in the peri aqueductal region. This can be seen with alcoholic encephalopathy (Wernicke's encephalopathy).  Past Medical History  Diagnosis Date  . HIV DISEASE 06/23/2006  . HYPERLIPIDEMIA 06/23/2006  . ANXIETY 06/23/2006  . DEPRESSION 06/23/2006  . HYPERTENSION 06/23/2006  . 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 Ascites 11/13/2009  . STRAIN, CHEST WALL 03/15/2007  . 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 Gastric ulcer 06/2010  . Alcoholic cirrhosis of liver   . Alcoholism, chronic   . Iron deficiency anemia   . GERD (gastroesophageal reflux disease)   . SBP (spontaneous bacterial peritonitis) 05/03/2011    Suspected by high leukocytes on paracentesis. Clinical scenario also compatible. November 2012 responded to Levaquin. Started on trimethoprim-sulfamethoxazole double strength daily for prophylaxis.   . Obesity (BMI 30-39.9)     BMI 34 kg/m^2  . Left foot infection   . Ascites   . Portal hypertensive gastropathy 01/02/2013  . Blood dyscrasia     HIV  . Skin cancer     left calf  . 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 Avulsion fracture of middle phalanges of  3rd/4th fingers left hand 01/07/2014    Past Surgical History  Procedure Laterality Date  . Esophagogastroduodenoscopy w/ banding  06/26/2010    variceal ligation  . Carpal tunnel release      left  .  Esophagogastroduodenoscopy  07/01/2010;  08/12/10    small varices, gastric ulcer  . Esophagogastroduodenoscopy  10/26/2011    Procedure: ESOPHAGOGASTRODUODENOSCOPY (EGD);  Surgeon: Gatha Mayer, MD;  Location: [facility];  Service: Endoscopy;  Laterality: N/A;  . Upper gastrointestinal endoscopy    . Colonoscopy  march 2013  . Esophagogastroduodenoscopy N/A 01/02/2013    Procedure:  ESOPHAGOGASTRODUODENOSCOPY (EGD);  Surgeon: Gatha Mayer, MD;  Location: [facility];  Service: Endoscopy;  Laterality: N/A;  . Gastric varices banding N/A 01/02/2013    Procedure: GASTRIC VARICES BANDING;  Surgeon: Gatha Mayer, MD;  Location: [facility];  Service: Endoscopy;  Laterality: N/A;  possible banding  . Esophagogastroduodenoscopy N/A 04/23/2013    Procedure: ESOPHAGOGASTRODUODENOSCOPY (EGD);  Surgeon: Gatha Mayer, MD;  Location: [facility];  Service: Endoscopy;  Laterality: N/A;  . Esophagogastroduodenoscopy N/A 05/01/2013    Procedure: ESOPHAGOGASTRODUODENOSCOPY (EGD);  Surgeon: Gatha Mayer, MD;  Location: [facility];  Service: Endoscopy;  Laterality: N/A;  follow-up varices and possibly band them  . Esophageal banding  05/01/2013    Procedure: ESOPHAGEAL BANDING;  Surgeon: Gatha Mayer, MD;  Location: [facility];  Service: Endoscopy;;  . Esophagogastroduodenoscopy N/A 09/04/2013    Procedure: ESOPHAGOGASTRODUODENOSCOPY (EGD);  Surgeon: Gatha Mayer, MD;  Location: [facility];  Service: Endoscopy;  Laterality: N/A;     Medications:  Current Outpatient Prescriptions on File Prior to Visit  Medication Sig Dispense Refill  . acyclovir (ZOVIRAX) 400 MG tablet Take 400 mg by mouth 2 (two) times daily as needed (cold sores).     . ALPRAZolam (XANAX) 1 MG tablet Take 1 tablet (1 mg total) by mouth 2 (two) times daily. 60 tablet 3  . aMILoride (MIDAMOR) 5 MG tablet TAKE 2 TABLETS EVERY DAY 60 tablet 5  . amitriptyline (ELAVIL) 100 MG tablet Take 1 tablet (100 mg total) by mouth at bedtime. 30 tablet 3  . B Complex-C (B-COMPLEX WITH VITAMIN C) tablet Take 1 tablet by mouth at bedtime.     Mariane Baumgarten Calcium (STOOL SOFTENER PO) Take 3 tablets by mouth at bedtime.    . dolutegravir (TIVICAY) 50 MG tablet Take 1 tablet (50 mg total) by mouth daily. 30 tablet 11  . emtricitabine-tenofovir (TRUVADA) 200-300 MG per tablet Take 1 tablet by mouth daily. 30 tablet 11  .  esomeprazole (NEXIUM) 40 MG capsule Take 1 capsule (40 mg total) by mouth daily before breakfast. 30 capsule 11  . furosemide (LASIX) 40 MG tablet TAKE 1 TABLET BY MOUTH TWICE DAILY 60 tablet 7  . lactulose (CHRONULAC) 10 GM/15ML solution Take 60 mls by mouth four times a day 7200 mL 1  . Polyethyl Glycol-Propyl Glycol (SYSTANE OP) Apply 1 drop to eye as needed (dry eyes).    . potassium chloride SA (K-DUR,KLOR-CON) 20 MEQ tablet Take 10 mEq by mouth 2 (two) times daily.    . sodium phosphate (FLEET) enema Place 1 enema rectally once. follow package directions    . XIFAXAN 550 MG TABS tablet TAKE 1 TABLET BY MOUTH TWICE DAILY 60 tablet 11   No current facility-administered medications on file prior to visit.    Allergies:  Allergies  Allergen Reactions  . Penicillins     REACTION: hives    Family History: Family History  Problem Relation Age of Onset  . Hyperlipidemia Mother   . Hypertension Mother   . Breast cancer Mother     questionable  . Heart disease Mother   . Hypertension Father   .  Irritable bowel syndrome Father   . Drug abuse Brother   . Heart disease Brother   . Breast cancer Maternal Aunt     maternal great aunt  . Colon cancer Neg Hx   . Alcohol abuse Other   . Heart disease Maternal Uncle   . Irritable bowel syndrome Paternal Aunt     Social History: History   Social History  . Marital Status: Single    Spouse Name: N/A  . Number of Children: N/A  . Years of Education: N/A   Occupational History  . disability    Social History Main Topics  . Smoking status: Former Smoker -- 0.10 packs/day for 10 years    Types: Cigarettes    Start date: 03/03/2014  . Smokeless tobacco: Never Used     Comment: form given 05-19-11  . Alcohol Use: No     Comment: pt is an alcoholic currently in [employer]  . Drug Use: No  . Sexual Activity:    Partners: Male    Birth Control/ Protection: Condom     Comment: pt. declined condoms   Other Topics  Concern  . Not on file   Social History Narrative   Single, disabled hair stylist   Lives with parents in a home with a basement.  Parents do not like for him to go down stairs because they are steep.               Review of Systems:  CONSTITUTIONAL: No fevers, chills, night sweats, or weight loss.   EYES: No visual changes or eye pain ENT: No hearing changes.  No history of nose bleeds.   RESPIRATORY: No cough, wheezing and shortness of breath.   CARDIOVASCULAR: Negative for chest pain, and palpitations.   GI: Negative for abdominal discomfort, blood in stools or black stools.  +recent change in bowel habits.   GU:  No history of incontinence.   MUSCLOSKELETAL: No history of joint pain or swelling.  No myalgias.   SKIN: Negative for lesions, rash, and itching.   HEMATOLOGY/ONCOLOGY: Negative for prolonged bleeding, bruising easily, and swollen nodes.  No history of cancer.   ENDOCRINE: Negative for cold or heat intolerance, polydipsia or goiter.   PSYCH:  +depression or anxiety symptoms.   NEURO: As Above.   Vital Signs:  BP 140/84 mmHg  Pulse 82  Ht 5' 10.75" (1.797 m)  Wt 290 lb 7 oz (131.742 kg)  BMI 40.80 kg/m2  SpO2 98%  General Medical Exam:   General:  Obese, Well appearing, comfortable.   Eyes/ENT: see cranial nerve examination.   Neck: No masses appreciated.  Full range of motion without tenderness.  No carotid bruits. Respiratory:  Clear to auscultation, good air entry bilaterally.   Cardiac:  Regular rate and rhythm, no murmur.   Extremities:  No deformities, edema, or skin discoloration.  Skin:  No rashes or lesions.  Neurological Exam: MENTAL STATUS including orientation to time, place, person, recent and remote memory, attention span and concentration, language, and fund of knowledge is fair.  Speech is slow, dysarthric.  CRANIAL NERVES: II:  No visual field defects.  Unremarkable fundi.   III-IV-VI: Pupils equal round and reactive to light.  Normal  conjugate, extra-ocular eye movements in all directions of gaze.  No nystagmus.  No ptosis.   V:  Normal facial sensation.     VII:  Normal facial symmetry and movements.  No pathologic facial reflexes.  VIII:  Normal hearing and vestibular function.  IX-X:  Normal palatal movement.   XI:  Normal shoulder shrug and head rotation.   XII:  Normal tongue strength and range of motion, no deviation or fasciculation.  MOTOR:  There is mild intention tremor bilaterally.  No asterixis.  No pronator drift.  Tone is normal.    Right Upper Extremity:    Left Upper Extremity:    Deltoid  5/5   Deltoid  5/5   Biceps  5/5   Biceps  5/5   Triceps  5/5   Triceps  5/5   Wrist extensors  5/5   Wrist extensors  5/5   Wrist flexors  5/5   Wrist flexors  5/5   Finger extensors  5/5   Finger extensors  5/5   Finger flexors  5/5   Finger flexors  5/5   Dorsal interossei  5-/5   Dorsal interossei  5-/5   Abductor pollicis  5/5   Abductor pollicis  5/5   Tone (Ashworth scale)  0  Tone (Ashworth scale)  0   Right Lower Extremity:    Left Lower Extremity:    Hip flexors  5/5   Hip flexors  5/5   Hip extensors  5/5   Hip extensors  5/5   Knee flexors  5/5   Knee flexors  5/5   Knee extensors  5/5   Knee extensors  5/5   Dorsiflexors  5/5   Dorsiflexors  5/5   Plantarflexors  5/5   Plantarflexors  5/5   Toe extensors  5-/5   Toe extensors  5-/5   Toe flexors  5-/5   Toe flexors  5-/5   Tone (Ashworth scale)  0  Tone (Ashworth scale)  0   MSRs:  Right                                                                 Left brachioradialis 2+  brachioradialis 2+  biceps 2+  biceps 2+  triceps 2+  triceps 2+  patellar 2+  patellar 2+  ankle jerk 0  ankle jerk 0  Hoffman no  Hoffman no  plantar response mute  plantar response mute   SENSORY:  Absent sensation to all modalities distal to ankles bilaterally  Vibration reduced at the knees.  Sensation preserved in the upper extremities.  There is sway with  Rhomberg testing.   COORDINATION/GAIT: Normal finger-to- nose-finger.  Unable to rise from a chair without using arms due to unsteadiness.  Gait is very unsteady, slow, and wide-based.    IMPRESSION: Mr. Abdou is a 49 year-old gentleman with HIV disease on anti-retroviral therapy and alcoholic cirrhosis presenting for evaluation of falls.  His neurological examination shows a distal predominant large fiber peripheral neuropathy. I had extensive discussion with the patient regarding the pathogenesis, etiology, management, and natural course of alcoholic neuropathy.  Drug induced neuropathy from medications is less likely.   MRI brain has been reviewed which shows chronic white matter changes, likely effect from long-term alcohol, no focal abnormalities to explain falls.   I agree with Dr. Carlean Purl that his gait abnormality and falls is multifactorial and mostly due to complications from chronic alcoholism including peripheral neuropathy causing sensory ataxia, cognitive slowing from hepatic encephalopathy, and lack of coordination from cerebellar degeneration,  and also medication effect.  I also agree that optimizing his medications for depression by weaning his ativan may help with his cognition, but he does not seem interested in establishing care with a psychiatrist.  I explained that neuropathy tends to be progressive even after neurotoxic effects are removed.  I encouraged him for alcohol cessation and discussed strategies to try to manage symptoms.  PLAN/RECOMMENDATIONS:  1.  Check vitamin B12, B1, copper, folate 2.  Recommend gait training and balance therapy 3.  Literature on fall precautions discussed 4.  Return to clinic as needed   The duration of this appointment visit was 45 minutes of face-to-face time with the patient.  Greater than 50% of this time was spent in counseling, explanation of diagnosis, planning of further management, and coordination of care.   Thank you for allowing  me to participate in patient's care.  If I can answer any additional questions, I would be pleased to do so.    Sincerely,    Welcome Fults K. Posey Pronto, DO

## 2014-07-31 ENCOUNTER — Telehealth: Payer: Self-pay | Admitting: Neurology

## 2014-07-31 NOTE — Telephone Encounter (Signed)
Will send referral to [facility] in [place].

## 2014-07-31 NOTE — Telephone Encounter (Signed)
Pt mother Taron Mondor [phone] or [phone] would like somewhere  [place] for physical therapy

## 2014-08-01 LAB — COPPER, SERUM: COPPER: 114 ug/dL (ref 70–175)

## 2014-08-02 LAB — VITAMIN B1: Vitamin B1 (Thiamine): 84 nmol/L — ABNORMAL HIGH (ref 8–30)

## 2014-08-06 ENCOUNTER — Other Ambulatory Visit: Payer: Self-pay | Admitting: *Deleted

## 2014-08-06 DIAGNOSIS — R269 Unspecified abnormalities of gait and mobility: Secondary | ICD-10-CM

## 2014-08-06 DIAGNOSIS — G609 Hereditary and idiopathic neuropathy, unspecified: Secondary | ICD-10-CM

## 2014-08-07 ENCOUNTER — Other Ambulatory Visit: Payer: Self-pay | Admitting: Internal Medicine

## 2014-08-07 NOTE — Telephone Encounter (Signed)
May I refill [name]? 

## 2014-08-08 ENCOUNTER — Telehealth: Payer: Self-pay | Admitting: Internal Medicine

## 2014-08-08 MED ORDER — ALPRAZOLAM 1 MG PO TABS: 1.0000 mg | ORAL_TABLET | Freq: Two times a day (BID) | ORAL | Status: DC

## 2014-08-08 NOTE — Telephone Encounter (Signed)
Yes but xanax x 4 mos

## 2014-08-08 NOTE — Telephone Encounter (Signed)
Refill x 12 

## 2014-08-08 NOTE — Telephone Encounter (Signed)
May I refill Both rx's [name]?

## 2014-08-08 NOTE — Telephone Encounter (Signed)
Rx for xanax faxed to [facility] in [place]

## 2014-08-12 ENCOUNTER — Telehealth: Payer: Self-pay | Admitting: Neurology

## 2014-08-12 NOTE — Telephone Encounter (Signed)
Attempted to call Mrs. Ysidro Evert.  Left message that I have tried [facility] but they do not accept his insurance.  I have sent out 2 more today to see if they will accept insurance.

## 2014-08-12 NOTE — Telephone Encounter (Signed)
Pt mother [name] called and states that they would like therapy to be done in [place] if possible please call [phone]

## 2014-08-14 NOTE — Telephone Encounter (Signed)
Reviewing telephone encounters - does anything need to be done? If this is complete please close encounter / Gayleen Orem

## 2014-08-28 ENCOUNTER — Telehealth: Payer: Self-pay | Admitting: *Deleted

## 2014-08-28 NOTE — Telephone Encounter (Signed)
1 

## 2014-08-28 NOTE — Telephone Encounter (Signed)
PT sent message that patient did not show up for his Initial Evaluation appt.

## 2014-08-30 ENCOUNTER — Encounter (HOSPITAL_COMMUNITY): Payer: Self-pay | Admitting: *Deleted

## 2014-09-02 ENCOUNTER — Telehealth: Payer: Self-pay | Admitting: Internal Medicine

## 2014-09-02 NOTE — Telephone Encounter (Signed)
I reviewed all the medications with the patient's mother.  All her questions are answered.  She will call back for any additional questions or concerns

## 2014-09-04 ENCOUNTER — Telehealth: Payer: Self-pay | Admitting: Internal Medicine

## 2014-09-04 NOTE — Telephone Encounter (Signed)
Spoke with Enid Derry (mom) , she is concerned about [name] doing the procedure next week.  He's sleeping a lot and he is depressed.  I told her I will pass this info on to Dr Carlean Purl so that he is aware.

## 2014-09-05 NOTE — Telephone Encounter (Signed)
Those issues should not be a problem for his egd. It is important that we recheck the varices.

## 2014-09-05 NOTE — Telephone Encounter (Signed)
Spoke with Enid Derry (mom) and stressed the importance of showing up next week to be scoped.  She verbalized understanding.

## 2014-09-10 ENCOUNTER — Ambulatory Visit (HOSPITAL_COMMUNITY): Payer: Medicaid Other | Admitting: Anesthesiology

## 2014-09-10 ENCOUNTER — Encounter (HOSPITAL_COMMUNITY)
Admission: RE | Disposition: A | Discharge status: Home / Self Care | Payer: Self-pay | Source: Ambulatory Visit | Attending: Internal Medicine

## 2014-09-10 ENCOUNTER — Encounter (HOSPITAL_COMMUNITY): Payer: Self-pay | Admitting: *Deleted

## 2014-09-10 ENCOUNTER — Ambulatory Visit (HOSPITAL_COMMUNITY)
Admission: RE | Admit: 2014-09-10 | Discharge: 2014-09-10 | Disposition: A | Discharge status: Home / Self Care | Payer: Medicaid Other | Source: Ambulatory Visit | Attending: Internal Medicine | Admitting: Internal Medicine

## 2014-09-10 DIAGNOSIS — Z6841 Body Mass Index (BMI) 40.0 and over, adult: Secondary | ICD-10-CM | POA: Insufficient documentation

## 2014-09-10 DIAGNOSIS — F329 Major depressive disorder, single episode, unspecified: Secondary | ICD-10-CM | POA: Insufficient documentation

## 2014-09-10 DIAGNOSIS — Z85828 Personal history of other malignant neoplasm of skin: Secondary | ICD-10-CM | POA: Diagnosis not present

## 2014-09-10 DIAGNOSIS — K2990 Gastroduodenitis, unspecified, without bleeding: Secondary | ICD-10-CM | POA: Diagnosis not present

## 2014-09-10 DIAGNOSIS — Z791 Long term (current) use of non-steroidal anti-inflammatories (NSAID): Secondary | ICD-10-CM | POA: Insufficient documentation

## 2014-09-10 DIAGNOSIS — F1910 Other psychoactive substance abuse, uncomplicated: Secondary | ICD-10-CM | POA: Insufficient documentation

## 2014-09-10 DIAGNOSIS — I10 Essential (primary) hypertension: Secondary | ICD-10-CM | POA: Diagnosis not present

## 2014-09-10 DIAGNOSIS — Z792 Long term (current) use of antibiotics: Secondary | ICD-10-CM | POA: Diagnosis not present

## 2014-09-10 DIAGNOSIS — Z87891 Personal history of nicotine dependence: Secondary | ICD-10-CM | POA: Insufficient documentation

## 2014-09-10 DIAGNOSIS — G9009 Other idiopathic peripheral autonomic neuropathy: Secondary | ICD-10-CM | POA: Diagnosis not present

## 2014-09-10 DIAGNOSIS — I8500 Esophageal varices without bleeding: Secondary | ICD-10-CM | POA: Diagnosis not present

## 2014-09-10 DIAGNOSIS — K3189 Other diseases of stomach and duodenum: Secondary | ICD-10-CM | POA: Diagnosis not present

## 2014-09-10 DIAGNOSIS — Z09 Encounter for follow-up examination after completed treatment for conditions other than malignant neoplasm: Secondary | ICD-10-CM | POA: Diagnosis present

## 2014-09-10 DIAGNOSIS — G4730 Sleep apnea, unspecified: Secondary | ICD-10-CM | POA: Insufficient documentation

## 2014-09-10 DIAGNOSIS — K219 Gastro-esophageal reflux disease without esophagitis: Secondary | ICD-10-CM | POA: Insufficient documentation

## 2014-09-10 DIAGNOSIS — Z21 Asymptomatic human immunodeficiency virus [HIV] infection status: Secondary | ICD-10-CM | POA: Diagnosis not present

## 2014-09-10 DIAGNOSIS — F419 Anxiety disorder, unspecified: Secondary | ICD-10-CM | POA: Diagnosis not present

## 2014-09-10 DIAGNOSIS — K7290 Hepatic failure, unspecified without coma: Secondary | ICD-10-CM | POA: Insufficient documentation

## 2014-09-10 DIAGNOSIS — K2970 Gastritis, unspecified, without bleeding: Secondary | ICD-10-CM | POA: Insufficient documentation

## 2014-09-10 DIAGNOSIS — K7030 Alcoholic cirrhosis of liver without ascites: Secondary | ICD-10-CM | POA: Insufficient documentation

## 2014-09-10 DIAGNOSIS — Z9889 Other specified postprocedural states: Secondary | ICD-10-CM | POA: Diagnosis not present

## 2014-09-10 HISTORY — PX: ESOPHAGOGASTRODUODENOSCOPY: SHX5428

## 2014-09-10 SURGERY — EGD (ESOPHAGOGASTRODUODENOSCOPY)
Anesthesia: Monitor Anesthesia Care

## 2014-09-10 MED ORDER — FUROSEMIDE 40 MG PO TABS: 40.0000 mg | ORAL_TABLET | Freq: Two times a day (BID) | ORAL | Status: DC

## 2014-09-10 MED ORDER — LACTATED RINGERS IV SOLN
INTRAVENOUS | Status: DC
  Administered 2014-09-10: 09:00:00 via INTRAVENOUS

## 2014-09-10 MED ORDER — SODIUM CHLORIDE 0.9 % IV SOLN: INTRAVENOUS | Status: DC

## 2014-09-10 MED ORDER — PROPOFOL 10 MG/ML IV BOLUS
INTRAVENOUS | Status: AC
  Filled 2014-09-10: qty 20

## 2014-09-10 MED ORDER — PROPOFOL INFUSION 10 MG/ML OPTIME
INTRAVENOUS | Status: DC | PRN
  Administered 2014-09-10: 160 ug/kg/min via INTRAVENOUS

## 2014-09-10 MED ORDER — AMILORIDE HCL 5 MG PO TABS: ORAL_TABLET | ORAL | Status: DC

## 2014-09-10 MED ORDER — BUTAMBEN-TETRACAINE-BENZOCAINE 2-2-14 % EX AERO
INHALATION_SPRAY | CUTANEOUS | Status: DC | PRN
  Administered 2014-09-10: 2 via TOPICAL

## 2014-09-10 NOTE — Anesthesia Postprocedure Evaluation (Signed)
  Anesthesia Post-op Note  Patient: [name]  Procedure(s) Performed: Procedure(s): ESOPHAGOGASTRODUODENOSCOPY (EGD) (N/A)  Patient Location: PACU  Anesthesia Type:MAC  Level of Consciousness: awake and alert   Airway and Oxygen Therapy: Patient Spontanous Breathing  Post-op Pain: none  Post-op Assessment: Post-op Vital signs reviewed  Post-op Vital Signs: Reviewed  Last Vitals:  Filed Vitals:   09/10/14 0950  BP: 133/84  Pulse: 63  Temp:   Resp: 11    Complications: No apparent anesthesia complications

## 2014-09-10 NOTE — Anesthesia Preprocedure Evaluation (Signed)
Anesthesia Evaluation  Patient identified by MRN, date of birth, ID band Patient awake    Reviewed: Allergy & Precautions, NPO status , Patient's Chart, lab work & pertinent test results  Airway Mallampati: III  TM Distance: >3 FB Neck ROM: Full    Dental  (+) Teeth Intact, Dental Advisory Given   Pulmonary sleep apnea , former smoker,          Cardiovascular hypertension, Pt. on medications     Neuro/Psych Anxiety Depression  Neuromuscular disease    GI/Hepatic PUD, GERD-  ,(+) Cirrhosis -  Esophageal Varices  substance abuse  alcohol use,   Endo/Other  Morbid obesity  Renal/GU negative Renal ROS     Musculoskeletal negative musculoskeletal ROS (+)   Abdominal   Peds  Hematology  (+) anemia ,   Anesthesia Other Findings   Reproductive/Obstetrics                             Anesthesia Physical Anesthesia Plan  ASA: III  Anesthesia Plan: MAC   Post-op Pain Management:    Induction: Intravenous  Airway Management Planned: Simple Face Mask and Natural Airway  Additional Equipment:   Intra-op Plan:   Post-operative Plan:   Informed Consent: I have reviewed the patients History and Physical, chart, labs and discussed the procedure including the risks, benefits and alternatives for the proposed anesthesia with the patient or authorized representative who has indicated his/her understanding and acceptance.     Plan Discussed with: CRNA  Anesthesia Plan Comments:         Anesthesia Quick Evaluation

## 2014-09-10 NOTE — Discharge Instructions (Addendum)
The varices were small and did not need repeat banding today. I took biopsies of some irritated stomach lining.   YOU HAD AN ENDOSCOPIC PROCEDURE TODAY: Refer to the procedure report and other information in the discharge instructions given to you for any specific questions about what was found during the examination. If this information does not answer your questions, please call Dr. Celesta Aver office at [phone] to clarify.   YOU SHOULD EXPECT: Some feelings of bloating in the abdomen. Passage of more gas than usual. Walking can help get rid of the air that was put into your GI tract during the procedure and reduce the bloating. If you had a lower endoscopy (such as a colonoscopy or flexible sigmoidoscopy) you may notice spotting of blood in your stool or on the toilet paper. Some abdominal soreness may be present for a day or two, also.  DIET: Your first meal following the procedure should be a light meal and then it is ok to progress to your normal diet. A half-sandwich or bowl of soup is an example of a good first meal. Heavy or fried foods are harder to digest and may make you feel nauseous or bloated. Drink plenty of fluids but you should avoid alcoholic beverages for 24 hours.   ACTIVITY: Your care partner should take you home directly after the procedure. You should plan to take it easy, moving slowly for the rest of the day. You can resume normal activity the day after the procedure however YOU SHOULD NOT DRIVE, use power tools, machinery or perform tasks that involve climbing or major physical exertion for 24 hours (because of the sedation medicines used during the test).   SYMPTOMS TO REPORT IMMEDIATELY: A gastroenterologist can be reached at any hour. Please call [phone]  for any of the following symptoms:   Following upper endoscopy (EGD, EUS, ERCP, esophageal dilation) Vomiting of blood or coffee ground material  New, significant abdominal pain  New, significant  chest pain or pain under the shoulder blades  Painful or persistently difficult swallowing  New shortness of breath  Black, tarry-looking or red, bloody stools  FOLLOW UP:  If any biopsies were taken you will be contacted by phone or by letter within the next 1-3 weeks. Call [phone]  if you have not heard about the biopsies in 3 weeks.  Please also call with any specific questions about appointments or follow up tests.     Esophagogastroduodenoscopy Esophagogastroduodenoscopy (EGD) is a procedure to examine the lining of the esophagus, stomach, and first part of the small intestine (duodenum). A long, flexible, lighted tube with a camera attached (endoscope) is inserted down the throat to view these organs. This procedure is done to detect problems or abnormalities, such as inflammation, bleeding, ulcers, or growths, in order to treat them. The procedure lasts about 5-20 minutes. It is usually an outpatient procedure, but it may need to be performed in emergency cases in the hospital. LET YOUR CAREGIVER KNOW ABOUT:   Allergies to food or medicine.  All medicines you are taking, including vitamins, herbs, eyedrops, and over-the-counter medicines and creams.  Use of steroids (by mouth or creams).  Previous problems you or members of your family have had with the use of anesthetics.  Any blood disorders you have.  Previous surgeries you have had.  Other health problems you have.  Possibility of pregnancy, if this applies. RISKS AND COMPLICATIONS  Generally, EGD is a safe procedure. However, as with any procedure, complications can occur.  Possible complications include:  Infection.  Bleeding.  Tearing (perforation) of the esophagus, stomach, or duodenum.  Difficulty breathing or not being able to breath.  Excessive sweating.  Spasms of the larynx.  Slowed heartbeat.  Low blood pressure. BEFORE THE PROCEDURE  Do not eat or drink anything for 6-8 hours before the  procedure or as directed by your caregiver.  Ask your caregiver about changing or stopping your regular medicines.  If you wear dentures, be prepared to remove them before the procedure.  Arrange for someone to drive you home after the procedure. PROCEDURE   A vein will be accessed to give medicines and fluids. A medicine to relax you (sedative) and a pain reliever will be given through that access into the vein.  A numbing medicine (local anesthetic) may be sprayed on your throat for comfort and to stop you from gagging or coughing.  A mouth guard may be placed in your mouth to protect your teeth and to keep you from biting on the endoscope.  You will be asked to lie on your left side.  The endoscope is inserted down your throat and into the esophagus, stomach, and duodenum.  Air is put through the endoscope to allow your caregiver to view the lining of your esophagus clearly.  The esophagus, stomach, and duodenum is then examined. During the exam, your caregiver may:  Remove tissue to be examined under a microscope (biopsy) for inflammation, infection, or other medical problems.  Remove growths.  Remove objects (foreign bodies) that are stuck.  Treat any bleeding with medicines or other devices that stop tissues from bleeding (hot cautery, clipping devices).  Widen (dilate) or stretch narrowed areas of the esophagus and stomach.  The endoscope will then be withdrawn. AFTER THE PROCEDURE  You will be taken to a recovery area to be monitored. You will be able to go home once you are stable and alert.  Do not eat or drink anything until the local anesthetic and numbing medicines have worn off. You may choke.  It is normal to feel bloated, have pain with swallowing, or have a sore throat for a short time. This will wear off.  Your caregiver should be able to discuss his or her findings with you. It will take longer to discuss the test results if any biopsies were  taken. Document Released: 10/01/2004 Document Revised: 10/15/2013 Document Reviewed: 05/03/2012 [facility] Patient Information 2015 [place], [place]. This information is not intended to replace advice given to you by your health care provider. Make sure you discuss any questions you have with your health care provider.

## 2014-09-10 NOTE — Op Note (Signed)
[facility] [place], [id number]   ENDOSCOPY PROCEDURE REPORT  PATIENT: [name], [name]  MR#: [id number] BIRTHDATE: [birth year] , 48  yrs. old GENDER: male ENDOSCOPIST: Gatha Mayer, MD, [facility] PROCEDURE DATE:  09/10/2014 PROCEDURE:  EGD w/ biopsy ASA CLASS:     Class III INDICATIONS:  surveillance and hx bleeding esophageal varices. MEDICATIONS: Monitored anesthesia care and Per Anesthesia TOPICAL ANESTHETIC: Cetacaine Spray  DESCRIPTION OF PROCEDURE: After the risks benefits and alternatives of the procedure were thoroughly explained, informed consent was obtained.  The Pentax Gastroscope [id number] endoscope was introduced through the mouth and advanced to the second portion of the duodenum , Without limitations.  The instrument was slowly withdrawn as the mucosa was fully examined.  1) Three columns of very small mid and distal esophageal varices without bleeding stigmata 2) Patchy erythema and eroded mucosa with contact friability in distal antrum - biopsies taken.  gastritis vs.  portal gastropatyhy. 3) Otherwise normal EGD.  Retroflexed views revealed no abnormalities.     The scope was then withdrawn from the patient and the procedure completed.  COMPLICATIONS: There were no immediate complications.  ENDOSCOPIC IMPRESSION: 1) Three columns of very small mid and distal esophageal varices without bleeding stigmata 2) Patchy erythema and eroded mucosa with contact friability in distal antrum - biopsies taken.  gastritis vs.  portal gastropatyhy. 3) Otherwise normal EGD  RECOMMENDATIONS: 1.  Await pathology results 2.  Repeat EGD 1 year most likely Do not think he is a good candidate for beta-blocker with co-morbidities Advised to take all of his lactulose - missing some doses Add MiraLax prn Will arrange REV also   eSigned:  Gatha Mayer, MD, [facility] 09/10/2014 10:19 AM   CC: Greig Right, MD

## 2014-09-10 NOTE — Transfer of Care (Signed)
Immediate Anesthesia Transfer of Care Note  Patient: [name]  Procedure(s) Performed: Procedure(s): ESOPHAGOGASTRODUODENOSCOPY (EGD) (N/A)  Patient Location: PACU  Anesthesia Type:MAC  Level of Consciousness: sedated  Airway & Oxygen Therapy: Patient Spontanous Breathing and Patient connected to nasal cannula oxygen  Post-op Assessment: Report given to RN and Post -op Vital signs reviewed and stable  Post vital signs: Reviewed and stable  Last Vitals:  Filed Vitals:   09/10/14 0840  BP: 156/86  Pulse: 81  Temp: 36.5 C  Resp: 15    Complications: No apparent anesthesia complications

## 2014-09-10 NOTE — H&P (Signed)
[facility] History and Physical   Primary Care Physician:  Greig Right, MD   Reason for Procedure:   Follow-up esophageal varices  Plan:    Upper endoscopy - possible esophageal banding - The risks and benefits as well as alternatives of endoscopic procedure(s) have been discussed and reviewed. All questions answered. The patient agrees to proceed.   HPI: [name] is a 49 y.o. male with cirrhosis and history of bleeding esophageal varices. Here fror surveillance endoscopy and possible variceal banding.   Past Medical History  Diagnosis Date  . HIV DISEASE 06/23/2006  . HYPERLIPIDEMIA 06/23/2006  . ANXIETY 06/23/2006  . DEPRESSION 06/23/2006  . HYPERTENSION 06/23/2006  . 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 Ascites 11/13/2009  . STRAIN, CHEST WALL 03/15/2007  . 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 Gastric ulcer 06/2010  . Alcoholic cirrhosis of liver   . Alcoholism, chronic   . Iron deficiency anemia   . GERD (gastroesophageal reflux disease)   . SBP (spontaneous bacterial peritonitis) 05/03/2011    Suspected by high leukocytes on paracentesis. Clinical scenario also compatible. November 2012 responded to Levaquin. Started on trimethoprim-sulfamethoxazole double strength daily for prophylaxis.   . Obesity (BMI 30-39.9)     BMI 34 kg/m^2  . Left foot infection   . Ascites   . Portal hypertensive gastropathy 01/02/2013  . Blood dyscrasia     HIV  . Skin cancer     left calf  . 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 Avulsion fracture of middle phalanges of  3rd/4th fingers left hand 01/07/2014    Past Surgical History  Procedure Laterality Date  . Esophagogastroduodenoscopy w/ banding  06/26/2010    variceal ligation  . Carpal tunnel release      left  . Esophagogastroduodenoscopy  07/01/2010;  08/12/10    small varices, gastric ulcer  .  Esophagogastroduodenoscopy  10/26/2011    Procedure: ESOPHAGOGASTRODUODENOSCOPY (EGD);  Surgeon: Gatha Mayer, MD;  Location: [facility];  Service: Endoscopy;  Laterality: N/A;  . Upper gastrointestinal endoscopy    . Colonoscopy  march 2013  . Esophagogastroduodenoscopy N/A 01/02/2013    Procedure: ESOPHAGOGASTRODUODENOSCOPY (EGD);  Surgeon: Gatha Mayer, MD;  Location: [facility];  Service: Endoscopy;  Laterality: N/A;  . Gastric varices banding N/A 01/02/2013    Procedure: GASTRIC VARICES BANDING;  Surgeon: Gatha Mayer, MD;  Location: [facility];  Service: Endoscopy;  Laterality: N/A;  possible banding  . Esophagogastroduodenoscopy N/A 04/23/2013    Procedure: ESOPHAGOGASTRODUODENOSCOPY (EGD);  Surgeon: Gatha Mayer, MD;  Location: [facility];  Service: Endoscopy;  Laterality: N/A;  . Esophagogastroduodenoscopy N/A 05/01/2013    Procedure: ESOPHAGOGASTRODUODENOSCOPY (EGD);  Surgeon: Gatha Mayer, MD;  Location: [facility];  Service: Endoscopy;  Laterality: N/A;  follow-up varices and possibly band them  . Esophageal banding  05/01/2013    Procedure: ESOPHAGEAL BANDING;  Surgeon: Gatha Mayer, MD;  Location: [facility];  Service: Endoscopy;;  . Esophagogastroduodenoscopy N/A 09/04/2013    Procedure: ESOPHAGOGASTRODUODENOSCOPY (EGD);  Surgeon: Gatha Mayer, MD;  Location: [facility];  Service: Endoscopy;  Laterality: N/A;    Prior to Admission medications   Medication Sig Start Date End Date Taking? Authorizing Provider  acyclovir (ZOVIRAX) 400 MG tablet Take 400 mg by mouth 2 (two) times daily as needed (cold sores).  10/29/11  Yes Historical Provider, MD  ALPRAZolam Duanne Moron) 1 MG tablet Take 1 tablet (1 mg total) by mouth 2 (  two) times daily. 08/08/14  Yes Gatha Mayer, MD  aMILoride (MIDAMOR) 5 MG tablet TAKE 2 TABLETS EVERY DAY Patient taking differently: take two tablet by mouth every morning. 07/19/14  Yes Gatha Mayer, MD  amitriptyline (ELAVIL) 100 MG tablet  TAKE 1 TABLET BY MOUTH AT BEDTIME 08/08/14  Yes Gatha Mayer, MD  B Complex-C (B-COMPLEX WITH VITAMIN C) tablet Take 1 tablet by mouth at bedtime.    Yes Historical Provider, MD  Docusate Calcium (STOOL SOFTENER PO) Take 3 tablets by mouth at bedtime.   Yes Historical Provider, MD  dolutegravir (TIVICAY) 50 MG tablet Take 1 tablet (50 mg total) by mouth daily. 07/03/14  Yes Truman Hayward, MD  emtricitabine-tenofovir (TRUVADA) 200-300 MG per tablet Take 1 tablet by mouth daily. 07/03/14  Yes Truman Hayward, MD  esomeprazole (NEXIUM) 40 MG capsule Take 1 capsule (40 mg total) by mouth daily before breakfast. 12/03/13  Yes Gatha Mayer, MD  furosemide (LASIX) 40 MG tablet TAKE 1 TABLET BY MOUTH TWICE DAILY Patient taking differently: TAKE 1 TABLET BY MOUTH TWICE DAILY. 05/22/14  Yes Gatha Mayer, MD  lactulose [facility]) 10 GM/15ML solution Take 60 mls by mouth four times a day 07/02/14  Yes Gatha Mayer, MD  Multiple Vitamin (MULTIVITAMIN WITH MINERALS) TABS tablet Take 1 tablet by mouth every morning.   Yes Historical Provider, MD  naproxen sodium (ANAPROX) 220 MG tablet Take 220-440 mg by mouth daily as needed (headache.).   Yes Historical Provider, MD  Polyethyl Glycol-Propyl Glycol (SYSTANE OP) Apply 1 drop to eye as needed (dry eyes).   Yes Historical Provider, MD  potassium chloride SA (K-DUR,KLOR-CON) 20 MEQ tablet Take 10 mEq by mouth 2 (two) times daily.   Yes Historical Provider, MD  sodium phosphate (FLEET) enema Place 1 enema rectally once. follow package directions   Yes Historical Provider, MD  SSD 1 % cream Apply 1 application topically daily as needed (Fever blisters.).  07/02/14  Yes Historical Provider, MD  XIFAXAN 550 MG TABS tablet TAKE 1 TABLET BY MOUTH TWICE DAILY 10/18/13  Yes Gatha Mayer, MD    No current facility-administered medications for this encounter.    Allergies as of 07/15/2014 - Review Complete 07/15/2014  Allergen Reaction Noted  . Penicillins       Family History  Problem Relation Age of Onset  . Hyperlipidemia Mother   . Hypertension Mother   . Breast cancer Mother     questionable  . Heart disease Mother   . Hypertension Father   . Irritable bowel syndrome Father   . Drug abuse Brother   . Heart disease Brother   . Breast cancer Maternal Aunt     maternal great aunt  . Colon cancer Neg Hx   . Alcohol abuse Other   . Heart disease Maternal Uncle   . Irritable bowel syndrome Paternal Aunt     History   Social History  . Marital Status: Single    Spouse Name: N/A  . Number of Children: N/A  . Years of Education: N/A   Occupational History  . disability    Social History Main Topics  . Smoking status: Former Smoker -- 0.10 packs/day for 10 years    Types: Cigarettes    Start date: 03/03/2014    Quit date: 04/03/2014  . Smokeless tobacco: Never Used     Comment: form given 05-19-11  . Alcohol Use: No     Comment: pt is  an alcoholic currently in [employer]  . Drug Use: No  . Sexual Activity:    Partners: Male    Birth Control/ Protection: Condom     Comment: pt. declined condoms   Other Topics Concern  . Not on file   Social History Narrative   Single, disabled hair stylist   Lives with parents in a home with a basement.  Parents do not like for him to go down stairs because they are steep.               Review of Systems: Positive for fatigue, some weakness All other review of systems negative except as mentioned in the HPI.  Physical Exam: Vital signs in last 24 hours:     General:   Alert, obses  Well-developed, well-nourished, pleasant and cooperative in NAD Lungs:  Clear throughout to auscultation.   Heart:  Regular rate and rhythm; no murmurs, clicks, rubs,  or gallops. Abdomen:  Soft, obese nontender and nondistended. Normal bowel sounds.   Neuro/Psych:  Alert and cooperative. Normal mood and affect.   @Jaideep Pollack  Simonne Maffucci, MD, [facility] [phone]  (pager) 09/10/2014 8:42 AM@

## 2014-09-11 ENCOUNTER — Encounter (HOSPITAL_COMMUNITY): Payer: Self-pay | Admitting: Internal Medicine

## 2014-09-11 NOTE — Progress Notes (Signed)
Quick Note:  Biopsies are related to cirrhosis - extra blood flow through stomach Not a major issue at this point  Please make sure he has an REV (next avail) ______

## 2014-09-13 ENCOUNTER — Telehealth: Payer: Self-pay | Admitting: Internal Medicine

## 2014-09-13 NOTE — Telephone Encounter (Signed)
I spoke with the patients brother, his mother is not at home right now.  He stated that she had a questions about the timing of the follow up.  She thought that he may need to be seen earlier based on the results of the EGD.  I explained that everything is fine and if anything changes she is welcome to call back and we will see at that time if the appt needs moved up.  He will have her call back if she has any additional questions or concerns

## 2014-09-20 ENCOUNTER — Telehealth: Payer: Self-pay | Admitting: Internal Medicine

## 2014-09-20 NOTE — Telephone Encounter (Signed)
We have checked multiple times and no ascites as recent as 06/2014 CT so I would not do any testing unless there is persistent weight increase and also would look for edema in legs

## 2014-09-20 NOTE — Telephone Encounter (Signed)
Mother notified

## 2014-09-20 NOTE — Telephone Encounter (Signed)
Mother called to state that she feels he is retaining fluid in his abdomen.  She reports that he is still able to go to the [facility] daily to workout and is not short of breath or c/o abdominal pain or "feeling tight".  She is just concerned.  We discussed daily weights and to give up a call if he has a 3 lb weight gain in 1 day or more.  Dr. Carlean Purl he has a follow up with you on 10/09/14.  Do you have any orders until office visit?

## 2014-09-26 ENCOUNTER — Other Ambulatory Visit: Payer: Self-pay

## 2014-09-26 MED ORDER — LACTULOSE 10 GM/15ML PO SOLN: ORAL | Status: DC

## 2014-10-09 ENCOUNTER — Other Ambulatory Visit: Payer: Medicaid Other

## 2014-10-09 ENCOUNTER — Ambulatory Visit (INDEPENDENT_AMBULATORY_CARE_PROVIDER_SITE_OTHER): Payer: Medicaid Other | Admitting: Internal Medicine

## 2014-10-09 ENCOUNTER — Encounter: Payer: Self-pay | Admitting: Internal Medicine

## 2014-10-09 ENCOUNTER — Other Ambulatory Visit: Payer: Self-pay | Admitting: Licensed Clinical Social Worker

## 2014-10-09 VITALS — BP 110/70 | HR 76 | Ht 70.75 in | Wt 302.5 lb

## 2014-10-09 DIAGNOSIS — K5901 Slow transit constipation: Secondary | ICD-10-CM | POA: Diagnosis not present

## 2014-10-09 DIAGNOSIS — F411 Generalized anxiety disorder: Secondary | ICD-10-CM

## 2014-10-09 DIAGNOSIS — K7031 Alcoholic cirrhosis of liver with ascites: Secondary | ICD-10-CM | POA: Diagnosis not present

## 2014-10-09 DIAGNOSIS — K7682 Hepatic encephalopathy: Secondary | ICD-10-CM

## 2014-10-09 DIAGNOSIS — K7290 Hepatic failure, unspecified without coma: Secondary | ICD-10-CM

## 2014-10-09 DIAGNOSIS — G9009 Other idiopathic peripheral autonomic neuropathy: Secondary | ICD-10-CM

## 2014-10-09 DIAGNOSIS — B20 Human immunodeficiency virus [HIV] disease: Secondary | ICD-10-CM

## 2014-10-09 LAB — COMPLETE METABOLIC PANEL WITH GFR
ALBUMIN: 3.7 g/dL (ref 3.5–5.2)
ALT: 25 U/L (ref 0–53)
AST: 33 U/L (ref 0–37)
Alkaline Phosphatase: 95 U/L (ref 39–117)
BUN: 11 mg/dL (ref 6–23)
CALCIUM: 8.9 mg/dL (ref 8.4–10.5)
CO2: 23 mEq/L (ref 19–32)
Chloride: 107 mEq/L (ref 96–112)
Creat: 0.73 mg/dL (ref 0.50–1.35)
Glucose, Bld: 77 mg/dL (ref 70–99)
POTASSIUM: 3.8 meq/L (ref 3.5–5.3)
SODIUM: 140 meq/L (ref 135–145)
Total Bilirubin: 1.3 mg/dL — ABNORMAL HIGH (ref 0.2–1.2)
Total Protein: 6.1 g/dL (ref 6.0–8.3)

## 2014-10-09 LAB — AMMONIA: Ammonia: 132 umol/L — ABNORMAL HIGH (ref 16–53)

## 2014-10-09 MED ORDER — POLYETHYLENE GLYCOL 3350 17 GM/SCOOP PO POWD: 17.0000 g | Freq: Two times a day (BID) | ORAL | Status: DC

## 2014-10-09 NOTE — Assessment & Plan Note (Addendum)
Stable CT 06/2014 no signs of HCC in liver and no ascites CHILD'S B MELD 10 (6% 3 month mortality from liver dz)

## 2014-10-09 NOTE — Progress Notes (Addendum)
Subjective:    Patient ID: [name], male    DOB: 1965/12/25, 49 y.o.   MRN: [id number] Cc: follow-up cirrhosis - constipated HPI  [name] is here with his mom who provides some of the hx. He has been having persistent constipation and requires enemas x 1 some nights to stool, despite 60 cc Lactulose qid. He still goes to the gym. Has had an episode where he "overdid it". Is doing cycle, treadmill, light weights. Still gaining weight. Some sleepiness though better than in past. Not confused. Needs alprazolam at least bid he says due to anxiety. Has seen neurology and PT referral placed re: neuropathy issues but has not followed through due to scheduling conflict. Still wants to do this. No recent falls.  Medications, allergies, past medical history, past surgical history, family history and social history are reviewed and updated in the EMR.  Review of Systems As above     Objective:   Physical Exam @BP  110/70 mmHg  Pulse 76  Ht 5' 10.75" (1.797 m)  Wt 302 lb 8 oz (137.213 kg)  BMI 42.49 kg/m2@  General:  NAD Eyes:   anicteric Lungs:  clear Heart:: S1S2 no rubs, murmurs or gallops Abdomen:  Obese, soft and nontender, BS+ Ext:  Trace RLE/ankle  edema, no cyanosis or clubbing Neuro: A and O x 2+ - "2015" no asterixis, shuffling and neuropathic gait Data Reviewed:  neurology notes, 1 and 07/2014 labs    Assessment & Plan:  Alcoholic cirrhosis of liver Stable CT 06/2014 no signs of HCC in liver and no ascites CHILD'S B MELD 10 (6% 3 month mortality from liver dz)   Constipation Add Miralax 2/daycall me in f/u 2-4 weeks Elevated NH3 suggest he is not taking lactulose as prescibed    Anxiety state Weaning alprazolam problematic Down to 1 mg bid   Idiopathic peripheral autonomic neuropathy Print out PT referral and go   ENCEPHALOPATHY, HEPATIC Seems better on exam but ammonia returned 132! With constipation have to ? If he is really taking the  lactulose. He is Rx 60 cc qid and also on Xifaxan 550 mg bid He has a spontaneous splenorenal shunt which could be leading to some rise in NH3 also and so can his exercise at the gym so I guess overall will continue to follow clinically - will have him recheck NH 3 after taking the MiraLAx also  May need higher dose of lactulose  He does exercise regularly and much more than most if not all cirrhotics I have typically dealt with  Will discuss     Lab Results  Component Value Date   WBC 3.4* 10/09/2014   HGB 12.0* 10/09/2014   HCT 35.4* 10/09/2014   MCV 82.7 10/09/2014   PLT 68* 10/09/2014   Lab Results  Component Value Date   ALT 25 10/09/2014   AST 33 10/09/2014   ALKPHOS 95 10/09/2014   BILITOT 1.3* 10/09/2014   Lab Results  Component Value Date   CREATININE 0.73 10/09/2014   BUN 11 10/09/2014   NA 140 10/09/2014   K 3.8 10/09/2014   CL 107 10/09/2014   CO2 23 10/09/2014   Lab Results  Component Value Date   INR 1.32 10/09/2014   INR 1.2* 04/12/2014   INR 1.2* 11/15/2013  alb 3.7 HIV1 quant < 20 RPR nonreactive CD 4 250   Current outpatient prescriptions:  .  acyclovir (ZOVIRAX) 400 MG tablet, Take 400 mg by mouth 2 (two) times daily as needed (  cold sores). , Disp: , Rfl:  .  ALPRAZolam (XANAX) 1 MG tablet, Take 1 tablet (1 mg total) by mouth 2 (two) times daily., Disp: 60 tablet, Rfl: 3 .  aMILoride (MIDAMOR) 5 MG tablet, take two tablet by mouth every morning., Disp: 60 tablet, Rfl: 5 .  amitriptyline (ELAVIL) 100 MG tablet, TAKE 1 TABLET BY MOUTH AT BEDTIME, Disp: 30 tablet, Rfl: 11 .  Docusate Calcium (STOOL SOFTENER PO), Take 3 tablets by mouth at bedtime., Disp: , Rfl:  .  dolutegravir (TIVICAY) 50 MG tablet, Take 1 tablet (50 mg total) by mouth daily., Disp: 30 tablet, Rfl: 11 .  emtricitabine-tenofovir (TRUVADA) 200-300 MG per tablet, Take 1 tablet by mouth daily., Disp: 30 tablet, Rfl: 11 .  esomeprazole (NEXIUM) 40 MG capsule, Take 1 capsule (40 mg  total) by mouth daily before breakfast., Disp: 30 capsule, Rfl: 11 .  furosemide (LASIX) 40 MG tablet, Take 1 tablet (40 mg total) by mouth 2 (two) times daily., Disp: 60 tablet, Rfl: 7 .  lactulose (CHRONULAC) 10 GM/15ML solution, Take 60 mls by mouth four times a day, Disp: 7200 mL, Rfl: 1 .  Polyethyl Glycol-Propyl Glycol (SYSTANE OP), Apply 1 drop to eye as needed (dry eyes)., Disp: , Rfl:  .  potassium chloride SA (K-DUR,KLOR-CON) 20 MEQ tablet, Take 10 mEq by mouth 2 (two) times daily., Disp: , Rfl:  .  sodium phosphate (FLEET) enema, Place 1 enema rectally daily. Takes at night, Disp: , Rfl:  .  SSD 1 % cream, Apply 1 application topically daily as needed (Fever blisters.). , Disp: , Rfl: 0 .  XIFAXAN 550 MG TABS tablet, TAKE 1 TABLET BY MOUTH TWICE DAILY, Disp: 60 tablet, Rfl: 11 .  polyethylene glycol powder (MIRALAX) powder, Take 17 g by mouth 2 (two) times daily., Disp: 850 g, Rfl: 11 .  PROAIR HFA 108 (90 BASE) MCG/ACT inhaler, , Disp: , Rfl: 0    RTC GI 4 months sooner prn  Cc:. Greig Right, MD Hollie Salk, MD

## 2014-10-09 NOTE — Assessment & Plan Note (Signed)
Weaning alprazolam problematic Down to 1 mg bid

## 2014-10-09 NOTE — Assessment & Plan Note (Addendum)
Add Miralax 2/daycall me in f/u 2-4 weeks Elevated NH3 suggest he is not taking lactulose as prescibed

## 2014-10-09 NOTE — Patient Instructions (Signed)
Today we are giving you printed lab orders to take with you .  We are giving you a copy of your physical therapy referral form.   We have sent the following medications to your pharmacy for you to pick up at your convenience: miralax   Follow up with [place] in 4 months.   I appreciate the opportunity to care for you.

## 2014-10-09 NOTE — Assessment & Plan Note (Signed)
Print out PT referral and go

## 2014-10-10 LAB — CBC WITH DIFFERENTIAL/PLATELET
Basophils Absolute: 0 10*3/uL (ref 0.0–0.1)
Basophils Relative: 1 % (ref 0–1)
EOS ABS: 0.1 10*3/uL (ref 0.0–0.7)
EOS PCT: 3 % (ref 0–5)
HEMATOCRIT: 35.4 % — AB (ref 39.0–52.0)
HEMOGLOBIN: 12 g/dL — AB (ref 13.0–17.0)
Lymphocytes Relative: 29 % (ref 12–46)
Lymphs Abs: 1 10*3/uL (ref 0.7–4.0)
MCH: 28 pg (ref 26.0–34.0)
MCHC: 33.9 g/dL (ref 30.0–36.0)
MCV: 82.7 fL (ref 78.0–100.0)
MONOS PCT: 11 % (ref 3–12)
MPV: 10.3 fL (ref 8.6–12.4)
Monocytes Absolute: 0.4 10*3/uL (ref 0.1–1.0)
Neutro Abs: 1.9 10*3/uL (ref 1.7–7.7)
Neutrophils Relative %: 56 % (ref 43–77)
Platelets: 68 10*3/uL — ABNORMAL LOW (ref 150–400)
RBC: 4.28 MIL/uL (ref 4.22–5.81)
RDW: 17.9 % — ABNORMAL HIGH (ref 11.5–15.5)
WBC: 3.4 10*3/uL — AB (ref 4.0–10.5)

## 2014-10-10 LAB — HIV-1 RNA QUANT-NO REFLEX-BLD
HIV 1 RNA Quant: <20 copies/mL (ref <20)
HIV-1 RNA Quant, Log: <1.3 {Log} (ref <1.30)

## 2014-10-10 LAB — PROTIME-INR
INR: 1.32 (ref <1.50)
Prothrombin Time: 16.4 seconds — ABNORMAL HIGH (ref 11.6–15.2)

## 2014-10-10 LAB — RPR

## 2014-10-10 LAB — T-HELPER CELL (CD4) - (RCID CLINIC ONLY)
CD4 T CELL ABS: 250 /uL — AB (ref 400–2700)
CD4 T CELL HELPER: 26 % — AB (ref 33–55)

## 2014-10-13 NOTE — Assessment & Plan Note (Addendum)
Seems better on exam but ammonia returned 132! With constipation have to ? If he is really taking the lactulose. He is Rx 60 cc qid and also on Xifaxan 550 mg bid He has a spontaneous splenorenal shunt which could be leading to some rise in NH3 also and so can his exercise at the gym so I guess overall will continue to follow clinically - will have him recheck NH 3 after taking the MiraLAx also  May need higher dose of lactulose  He does exercise regularly and much more than most if not all cirrhotics I have typically dealt with  Will discuss

## 2014-10-14 ENCOUNTER — Telehealth: Payer: Self-pay

## 2014-10-14 DIAGNOSIS — K7290 Hepatic failure, unspecified without coma: Secondary | ICD-10-CM

## 2014-10-14 DIAGNOSIS — K7682 Hepatic encephalopathy: Secondary | ICD-10-CM

## 2014-10-14 NOTE — Telephone Encounter (Signed)
-----   Message from Gatha Mayer, MD sent at 10/13/2014  5:10 PM EDT ----- Regarding: elevated NH3 His NH 3 is up at 132  Mental status seemed ok for him  Please let patient and mom know about this - his exercise could have done this (muscle release) - but I do not think he needs to stop it just not overdo it  Hopefully bowels will move better with MiraLax and continued lactulose -  I want him to check NH3 again at the end of this week or next week Dx hepatic encephalopathy

## 2014-10-14 NOTE — Telephone Encounter (Signed)
Mother notified She will have him come on Friday or Monday for repeat labs

## 2014-10-21 ENCOUNTER — Other Ambulatory Visit (INDEPENDENT_AMBULATORY_CARE_PROVIDER_SITE_OTHER): Payer: Medicaid Other

## 2014-10-21 ENCOUNTER — Other Ambulatory Visit: Payer: Medicaid Other

## 2014-10-21 DIAGNOSIS — K7290 Hepatic failure, unspecified without coma: Secondary | ICD-10-CM

## 2014-10-21 DIAGNOSIS — K7682 Hepatic encephalopathy: Secondary | ICD-10-CM

## 2014-10-21 DIAGNOSIS — K7031 Alcoholic cirrhosis of liver with ascites: Secondary | ICD-10-CM

## 2014-10-21 LAB — PROTIME-INR
INR: 1.2 ratio — ABNORMAL HIGH (ref 0.8–1.0)
Prothrombin Time: 12.8 s (ref 9.6–13.1)

## 2014-10-21 LAB — AMMONIA: Ammonia: 99 umol/L — ABNORMAL HIGH (ref 11–35)

## 2014-10-21 NOTE — Progress Notes (Signed)
Quick Note:  Ammonia lower INR ok  1) How many bowel movements a day without enema? 2) with enema? 3) Lactulose, miralax and xifaxan compliance? 4) any falls, excessive sleepiness, confusion? 5) How many hrs at gym each day and days/week?   ______

## 2014-10-21 NOTE — Progress Notes (Signed)
Quick Note:  OK sounds better Continue as he is and should have f/u me in July ______

## 2014-10-24 ENCOUNTER — Telehealth: Payer: Self-pay | Admitting: *Deleted

## 2014-10-24 NOTE — Telephone Encounter (Signed)
Patient is requesting record release to [facility], attn Shelia (no contact numbers given).  He states that he would like to keep this information from his parents, as they do not support him going through the surgery, but he is still going through with it.  Patient would like Tamika to call him on his mobile number regarding this: [phone]. Landis Gandy, RN

## 2014-10-24 NOTE — Telephone Encounter (Signed)
Hopefuly he is also following Dr. Celesta Aver instructions re managing his lactulose etc

## 2014-10-26 ENCOUNTER — Other Ambulatory Visit: Payer: Self-pay | Admitting: Internal Medicine

## 2014-11-04 ENCOUNTER — Other Ambulatory Visit: Payer: Self-pay | Admitting: Internal Medicine

## 2014-11-04 ENCOUNTER — Ambulatory Visit: Payer: Medicaid Other | Admitting: Infectious Disease

## 2014-11-04 NOTE — Telephone Encounter (Signed)
Needs to wait until Rx is able to renew

## 2014-11-06 NOTE — Telephone Encounter (Signed)
Pharmacy informed and they are filling the xanax rx.  Patient's mom informed of this.

## 2014-11-28 ENCOUNTER — Telehealth: Payer: Self-pay | Admitting: Internal Medicine

## 2014-11-28 NOTE — Telephone Encounter (Signed)
Left message for patient to call back  

## 2014-11-29 NOTE — Telephone Encounter (Signed)
I spoke with the patient's mother.  She reports that Draven is "very sleepy" he is confused, uncoordinated, lethargic, she has to wake him to eat.  He has not been to the gym at all this week,  He usually goes 2 times a day.  His mom reports that he is taking 8 tbsp daily of lactulose, xifaxan BID, and Miralax 17 Gm BID, but no BM unless his mom gives him an enema.  She is advised that he should go to the ER for elevated ammonia level.  She is insistent she does not want to take him to tthe ER.  "What can we do to keep him at home?"  Discussed with Dr. Olevia Perches.  She said he could try holding lasix for 3 days and do not restrict his fluid.  Mother notified and she verbalized understanding.    She is aware to take him to the ER or call 911 if she is unable to wake him or his symptoms worsen.  She is notified that I will have Dr. Carlean Purl review and we will call him next week with a plan

## 2014-12-02 ENCOUNTER — Emergency Department (HOSPITAL_COMMUNITY)
Admission: EM | Admit: 2014-12-02 | Discharge: 2014-12-02 | Disposition: A | Discharge status: Home / Self Care | Payer: Medicaid Other | Attending: Emergency Medicine | Admitting: Emergency Medicine

## 2014-12-02 ENCOUNTER — Telehealth: Payer: Self-pay | Admitting: Internal Medicine

## 2014-12-02 ENCOUNTER — Encounter (HOSPITAL_COMMUNITY): Payer: Self-pay | Admitting: *Deleted

## 2014-12-02 DIAGNOSIS — E785 Hyperlipidemia, unspecified: Secondary | ICD-10-CM | POA: Insufficient documentation

## 2014-12-02 DIAGNOSIS — K219 Gastro-esophageal reflux disease without esophagitis: Secondary | ICD-10-CM | POA: Diagnosis not present

## 2014-12-02 DIAGNOSIS — Z8669 Personal history of other diseases of the nervous system and sense organs: Secondary | ICD-10-CM | POA: Diagnosis not present

## 2014-12-02 DIAGNOSIS — Z862 Personal history of diseases of the blood and blood-forming organs and certain disorders involving the immune mechanism: Secondary | ICD-10-CM | POA: Diagnosis not present

## 2014-12-02 DIAGNOSIS — Z880 Allergy status to penicillin: Secondary | ICD-10-CM | POA: Diagnosis not present

## 2014-12-02 DIAGNOSIS — Z87438 Personal history of other diseases of male genital organs: Secondary | ICD-10-CM | POA: Diagnosis not present

## 2014-12-02 DIAGNOSIS — F329 Major depressive disorder, single episode, unspecified: Secondary | ICD-10-CM | POA: Diagnosis not present

## 2014-12-02 DIAGNOSIS — I10 Essential (primary) hypertension: Secondary | ICD-10-CM | POA: Insufficient documentation

## 2014-12-02 DIAGNOSIS — Z87828 Personal history of other (healed) physical injury and trauma: Secondary | ICD-10-CM | POA: Diagnosis not present

## 2014-12-02 DIAGNOSIS — Z0000 Encounter for general adult medical examination without abnormal findings: Secondary | ICD-10-CM

## 2014-12-02 DIAGNOSIS — Z8781 Personal history of (healed) traumatic fracture: Secondary | ICD-10-CM | POA: Insufficient documentation

## 2014-12-02 DIAGNOSIS — R5383 Other fatigue: Secondary | ICD-10-CM | POA: Diagnosis present

## 2014-12-02 DIAGNOSIS — Z872 Personal history of diseases of the skin and subcutaneous tissue: Secondary | ICD-10-CM | POA: Insufficient documentation

## 2014-12-02 DIAGNOSIS — F1310 Sedative, hypnotic or anxiolytic abuse, uncomplicated: Secondary | ICD-10-CM | POA: Insufficient documentation

## 2014-12-02 DIAGNOSIS — Z8619 Personal history of other infectious and parasitic diseases: Secondary | ICD-10-CM | POA: Insufficient documentation

## 2014-12-02 DIAGNOSIS — Z85828 Personal history of other malignant neoplasm of skin: Secondary | ICD-10-CM | POA: Diagnosis not present

## 2014-12-02 DIAGNOSIS — F419 Anxiety disorder, unspecified: Secondary | ICD-10-CM | POA: Insufficient documentation

## 2014-12-02 DIAGNOSIS — Z8709 Personal history of other diseases of the respiratory system: Secondary | ICD-10-CM | POA: Insufficient documentation

## 2014-12-02 DIAGNOSIS — Z87891 Personal history of nicotine dependence: Secondary | ICD-10-CM | POA: Insufficient documentation

## 2014-12-02 DIAGNOSIS — Z8679 Personal history of other diseases of the circulatory system: Secondary | ICD-10-CM | POA: Diagnosis not present

## 2014-12-02 DIAGNOSIS — Z79899 Other long term (current) drug therapy: Secondary | ICD-10-CM | POA: Diagnosis not present

## 2014-12-02 DIAGNOSIS — Z21 Asymptomatic human immunodeficiency virus [HIV] infection status: Secondary | ICD-10-CM | POA: Diagnosis not present

## 2014-12-02 LAB — COMPREHENSIVE METABOLIC PANEL
ALT: 25 U/L (ref 17–63)
AST: 33 U/L (ref 15–41)
Albumin: 3.9 g/dL (ref 3.5–5.0)
Alkaline Phosphatase: 119 U/L (ref 38–126)
Anion gap: 11 (ref 5–15)
BUN: 7 mg/dL (ref 6–20)
CO2: 21 mmol/L — ABNORMAL LOW (ref 22–32)
CREATININE: 0.73 mg/dL (ref 0.61–1.24)
Calcium: 8.4 mg/dL — ABNORMAL LOW (ref 8.9–10.3)
Chloride: 108 mmol/L (ref 101–111)
GFR calc Af Amer: >60 mL/min (ref >60)
GFR calc non Af Amer: >60 mL/min (ref >60)
GLUCOSE: 95 mg/dL (ref 65–99)
Potassium: 3.7 mmol/L (ref 3.5–5.1)
Sodium: 140 mmol/L (ref 135–145)
TOTAL PROTEIN: 6.8 g/dL (ref 6.5–8.1)
Total Bilirubin: 1 mg/dL (ref 0.3–1.2)

## 2014-12-02 LAB — URINALYSIS, ROUTINE W REFLEX MICROSCOPIC
BILIRUBIN URINE: NEGATIVE
GLUCOSE, UA: NEGATIVE mg/dL
Hgb urine dipstick: NEGATIVE
KETONES UR: NEGATIVE mg/dL
Leukocytes, UA: NEGATIVE
Nitrite: NEGATIVE
PROTEIN: NEGATIVE mg/dL
Specific Gravity, Urine: 1.004 — ABNORMAL LOW (ref 1.005–1.030)
Urobilinogen, UA: 1 mg/dL (ref 0.0–1.0)
pH: 7 (ref 5.0–8.0)

## 2014-12-02 LAB — RAPID URINE DRUG SCREEN, HOSP PERFORMED
AMPHETAMINES: NOT DETECTED
Barbiturates: NOT DETECTED
Benzodiazepines: POSITIVE — AB
Cocaine: NOT DETECTED
Opiates: NOT DETECTED
Tetrahydrocannabinol: NOT DETECTED

## 2014-12-02 LAB — CBC
HEMATOCRIT: 37.7 % — AB (ref 39.0–52.0)
Hemoglobin: 12.3 g/dL — ABNORMAL LOW (ref 13.0–17.0)
MCH: 26.9 pg (ref 26.0–34.0)
MCHC: 32.6 g/dL (ref 30.0–36.0)
MCV: 82.5 fL (ref 78.0–100.0)
Platelets: 74 10*3/uL — ABNORMAL LOW (ref 150–400)
RBC: 4.57 MIL/uL (ref 4.22–5.81)
RDW: 17.1 % — ABNORMAL HIGH (ref 11.5–15.5)
WBC: 3.3 10*3/uL — ABNORMAL LOW (ref 4.0–10.5)

## 2014-12-02 LAB — ETHANOL

## 2014-12-02 LAB — LIPASE, BLOOD: Lipase: 21 U/L — ABNORMAL LOW (ref 22–51)

## 2014-12-02 LAB — CK: CK TOTAL: 164 U/L (ref 49–397)

## 2014-12-02 LAB — AMMONIA: Ammonia: 91 umol/L — ABNORMAL HIGH (ref 9–35)

## 2014-12-02 MED ORDER — SODIUM CHLORIDE 0.9 % IV BOLUS (SEPSIS)
1000.0000 mL | Freq: Once | INTRAVENOUS | Status: AC
  Administered 2014-12-02: 1000 mL via INTRAVENOUS

## 2014-12-02 MED ORDER — LORAZEPAM 1 MG PO TABS
1.0000 mg | ORAL_TABLET | Freq: Once | ORAL | Status: AC
Start: 2014-12-02 — End: 2014-12-02
  Administered 2014-12-02: 1 mg via ORAL
  Filled 2014-12-02: qty 1

## 2014-12-02 NOTE — Telephone Encounter (Signed)
OK  Please get an update from her.

## 2014-12-02 NOTE — ED Notes (Signed)
Family at bedside, pt /family denied questions concerns r/t dc. Pt ambulatory and a&ox4

## 2014-12-02 NOTE — ED Notes (Addendum)
Urine sample given by pt, clear in color,warm. Pt encouraged to give sample, will f/u

## 2014-12-02 NOTE — Telephone Encounter (Signed)
I spoke with his mother this am.  Lethargy has not improved with holding diuretics.  He fell last night.  "all he is doing is sleeping and confused"

## 2014-12-02 NOTE — ED Provider Notes (Signed)
CSN: [id number]     Arrival date & time 12/02/14  1725 History   First MD Initiated Contact with Patient 12/02/14 2003     Chief Complaint  Patient presents with  . Fatigue     (Consider location/radiation/quality/duration/timing/severity/associated sxs/prior Treatment) The history is provided by the patient and a parent. No language interpreter was used.   [name] is a 49 year old male with a history of hyperlipidemia, HIV, anxiety, depression, hypertension, hepatic encephalopathy, ascites, esophageal varices, cirrhosis of the liver due to alcoholism, SBP who states he has been sleeping but nothing out of the ordinary. Mom states otherwise and states she forced him to come to the hospital today because he has been sleeping excessively for the past week and only getting up to eat. She is worried since he has a long medical history that something else may be wrong. He states that he recently started working out and that's why he is sleeping so much. Patient denies any fever, chills, headache, dizziness, syncope, chest pain, shortness of breath, cough, nausea, vomiting, urinary symptoms, increased leg swelling. Past Medical History  Diagnosis Date  . HIV DISEASE 06/23/2006  . HYPERLIPIDEMIA 06/23/2006  . ANXIETY 06/23/2006  . DEPRESSION 06/23/2006  . HYPERTENSION 06/23/2006  . 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 Ascites 11/13/2009  . STRAIN, CHEST WALL 03/15/2007  . 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 Gastric ulcer 06/2010  . Alcoholic cirrhosis of liver   . Alcoholism, chronic   . Iron deficiency anemia   . GERD (gastroesophageal reflux disease)   . SBP (spontaneous bacterial peritonitis) 05/03/2011    Suspected by high leukocytes on paracentesis. Clinical scenario also compatible. November 2012 responded to Levaquin. Started on trimethoprim-sulfamethoxazole double strength daily for prophylaxis.   . Obesity  (BMI 30-39.9)     BMI 34 kg/m^2  . Left foot infection   . Ascites   . Portal hypertensive gastropathy 01/02/2013  . Blood dyscrasia     HIV  . Skin cancer     left calf  . 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 Avulsion fracture of middle phalanges of  3rd/4th fingers left hand 01/07/2014   Past Surgical History  Procedure Laterality Date  . Esophagogastroduodenoscopy w/ banding  06/26/2010    variceal ligation  . Carpal tunnel release      left  . Esophagogastroduodenoscopy  07/01/2010;  08/12/10    small varices, gastric ulcer  . Esophagogastroduodenoscopy  10/26/2011    Procedure: ESOPHAGOGASTRODUODENOSCOPY (EGD);  Surgeon: Gatha Mayer, MD;  Location: [facility];  Service: Endoscopy;  Laterality: N/A;  . Upper gastrointestinal endoscopy    . Colonoscopy  march 2013  . Esophagogastroduodenoscopy N/A 01/02/2013    Procedure: ESOPHAGOGASTRODUODENOSCOPY (EGD);  Surgeon: Gatha Mayer, MD;  Location: [facility];  Service: Endoscopy;  Laterality: N/A;  . Gastric varices banding N/A 01/02/2013    Procedure: GASTRIC VARICES BANDING;  Surgeon: Gatha Mayer, MD;  Location: [facility];  Service: Endoscopy;  Laterality: N/A;  possible banding  . Esophagogastroduodenoscopy N/A 04/23/2013    Procedure: ESOPHAGOGASTRODUODENOSCOPY (EGD);  Surgeon: Gatha Mayer, MD;  Location: [facility];  Service: Endoscopy;  Laterality: N/A;  . Esophagogastroduodenoscopy N/A 05/01/2013    Procedure: ESOPHAGOGASTRODUODENOSCOPY (EGD);  Surgeon: Gatha Mayer, MD;  Location: [facility];  Service: Endoscopy;  Laterality: N/A;  follow-up varices and possibly band them  . Esophageal banding  05/01/2013    Procedure: ESOPHAGEAL BANDING;  Surgeon: Glendell Docker  Simonne Maffucci, MD;  Location: [facility];  Service: Endoscopy;;  . Esophagogastroduodenoscopy N/A 09/04/2013    Procedure: ESOPHAGOGASTRODUODENOSCOPY (EGD);  Surgeon: Gatha Mayer, MD;  Location: [facility];  Service: Endoscopy;   Laterality: N/A;  . Esophagogastroduodenoscopy N/A 09/10/2014    Procedure: ESOPHAGOGASTRODUODENOSCOPY (EGD);  Surgeon: Gatha Mayer, MD;  Location: [facility];  Service: Endoscopy;  Laterality: N/A;   Family History  Problem Relation Age of Onset  . Hyperlipidemia Mother   . Hypertension Mother   . Breast cancer Mother     questionable  . Heart disease Mother   . Hypertension Father   . Irritable bowel syndrome Father   . Drug abuse Brother   . Heart disease Brother   . Breast cancer Maternal Aunt     maternal great aunt  . Colon cancer Neg Hx   . Alcohol abuse Other   . Heart disease Maternal Uncle   . Irritable bowel syndrome Paternal Aunt    History  Substance Use Topics  . Smoking status: Former Smoker -- 0.10 packs/day for 10 years    Types: Cigarettes    Start date: 03/03/2014    Quit date: 04/03/2014  . Smokeless tobacco: Never Used     Comment: form given 05-19-11  . Alcohol Use: No     Comment: pt is an alcoholic currently in [employer] meetings    Review of Systems  Gastrointestinal: Negative for nausea, vomiting, abdominal pain and diarrhea.  All other systems reviewed and are negative.     Allergies  Penicillins  Home Medications   Prior to Admission medications   Medication Sig Start Date End Date Taking? Authorizing Provider  ALPRAZolam Duanne Moron) 1 MG tablet Take 1 tablet (1 mg total) by mouth 2 (two) times daily. 08/08/14  Yes Gatha Mayer, MD  aMILoride (MIDAMOR) 5 MG tablet take two tablet by mouth every morning. 09/10/14  Yes Gatha Mayer, MD  amitriptyline (ELAVIL) 100 MG tablet TAKE 1 TABLET BY MOUTH AT BEDTIME 08/08/14  Yes Gatha Mayer, MD  Docusate Calcium (STOOL SOFTENER PO) Take 3 tablets by mouth at bedtime.   Yes Historical Provider, MD  dolutegravir (TIVICAY) 50 MG tablet Take 1 tablet (50 mg total) by mouth daily. 07/03/14  Yes Truman Hayward, MD  emtricitabine-tenofovir (TRUVADA) 200-300 MG per tablet Take 1 tablet by mouth  daily. 07/03/14  Yes Truman Hayward, MD  esomeprazole (NEXIUM) 40 MG capsule Take 1 capsule (40 mg total) by mouth daily before breakfast. 12/03/13  Yes Gatha Mayer, MD  furosemide (LASIX) 40 MG tablet Take 1 tablet (40 mg total) by mouth 2 (two) times daily. 09/10/14  Yes Gatha Mayer, MD  lactulose [facility]) 10 GM/15ML solution Take 60 mls by mouth four times a day 09/26/14  Yes Gatha Mayer, MD  polyethylene glycol powder (MIRALAX) powder Take 17 g by mouth 2 (two) times daily. 10/09/14  Yes Gatha Mayer, MD  potassium chloride SA (K-DUR,KLOR-CON) 20 MEQ tablet Take 10 mEq by mouth daily.    Yes Historical Provider, MD  PROAIR HFA 108 (90 BASE) MCG/ACT inhaler Inhale 2 puffs into the lungs every 6 (six) hours as needed for wheezing or shortness of breath (wheezing).  09/30/14  Yes Historical Provider, MD  sodium phosphate (FLEET) enema Place 1 enema rectally daily. Takes at night   Yes Historical Provider, MD  XIFAXAN 550 MG TABS tablet TAKE 1 TABLET BY MOUTH TWICE DAILY 10/28/14  Yes Gatha Mayer,  MD  acyclovir (ZOVIRAX) 400 MG tablet Take 400 mg by mouth 2 (two) times daily as needed (cold sores).  10/29/11   Historical Provider, MD  Polyethyl Glycol-Propyl Glycol (SYSTANE OP) Apply 1 drop to eye as needed (dry eyes).    Historical Provider, MD  SSD 1 % cream Apply 1 application topically daily as needed (Fever blisters.).  07/02/14   Historical Provider, MD   BP 126/73 mmHg  Pulse 66  Temp(Src) 97.8 F (36.6 C) (Oral)  Resp 16  SpO2 100% Physical Exam  Constitutional: He is oriented to person, place, and time. He appears well-developed and well-nourished.  Morbidly obese.  HENT:  Head: Normocephalic and atraumatic.  Eyes: Conjunctivae are normal.  Neck: Normal range of motion. Neck supple.  Cardiovascular: Normal rate, regular rhythm and normal heart sounds.   Pulmonary/Chest: Effort normal and breath sounds normal. No respiratory distress. He has no wheezes. He has no  rales.  Abdominal: Soft. There is no tenderness. There is no guarding.  Musculoskeletal: Normal range of motion.  Neurological: He is alert and oriented to person, place, and time.  Skin: Skin is warm and dry.  Nursing note and vitals reviewed.   ED Course  Procedures (including critical care time) Labs Review Labs Reviewed  CBC - Abnormal; Notable for the following:    WBC 3.3 (*)    Hemoglobin 12.3 (*)    HCT 37.7 (*)    RDW 17.1 (*)    Platelets 74 (*)    All other components within normal limits  COMPREHENSIVE METABOLIC PANEL - Abnormal; Notable for the following:    CO2 21 (*)    Calcium 8.4 (*)    All other components within normal limits  LIPASE, BLOOD - Abnormal; Notable for the following:    Lipase 21 (*)    All other components within normal limits  AMMONIA - Abnormal; Notable for the following:    Ammonia 91 (*)    All other components within normal limits  URINALYSIS, ROUTINE W REFLEX MICROSCOPIC (NOT AT [facility]) - Abnormal; Notable for the following:    Specific Gravity, Urine 1.004 (*)    All other components within normal limits  URINE RAPID DRUG SCREEN, HOSP PERFORMED - Abnormal; Notable for the following:    Benzodiazepines POSITIVE (*)    All other components within normal limits  ETHANOL  CK    Imaging Review No results found.   EKG Interpretation None      MDM   Final diagnoses:  Visit for well man health check  Patient presents for fatigue for the past week. He states he feels normal but mom is concerned. His vitals are stable. He states he takes Ativan 2 mg daily. His labs are not concerning. His ammonia has been consistently elevated throughout the past 6 months. He is currently taking lactulose 4 times a day at home. He is in no acute distress. He stated he had been working out recently so I checked a CK which is negative for rhabdomyolysis. Patient has no signs of benzo withdrawal or seizures. He can follow up with his PCP in 2 days and I  explained that he can take half of his afternoon dose of Ativan if he is concerned about sleeping too much. He will also need to speak to his PCP about this.     Ottie Glazier, PA-C 12/03/14 0112  Orpah Greek, MD 12/09/14 [phone]

## 2014-12-02 NOTE — ED Notes (Signed)
Family at bedside. 

## 2014-12-02 NOTE — ED Notes (Signed)
Patient requesting to ambulate to bathroom to void-patient unable to get up to side of bed by self, shaking extremities.  Instructed patient to use urinal at bedside/parents at bedside

## 2014-12-02 NOTE — Telephone Encounter (Signed)
See phone note from 11/29/14 for additional details

## 2014-12-02 NOTE — Telephone Encounter (Signed)
He needs to come to ED for evaluation Please tell her I said that Come to [facility]

## 2014-12-02 NOTE — Discharge Instructions (Signed)
Follow-up with your primary care provider in 2 days. Take lactulose as prescribed. Take half of your afternoon dose of Xanax for the next 5 days.

## 2014-12-02 NOTE — ED Notes (Signed)
Pt does not want IV access unless provider recommends.

## 2014-12-02 NOTE — ED Notes (Signed)
Pt info me his urine sample was ready. Notice hot water inside urine cup.Info pt we will need urine to send down to our lab.

## 2014-12-02 NOTE — Telephone Encounter (Signed)
Mother notified.  She verbalized understanding to go to the ER

## 2014-12-02 NOTE — ED Notes (Signed)
Pt complains of lethargy since last week. Pt states Dr. Carlean Purl wanted him to come to ED to have ammonia levels checked.

## 2014-12-04 ENCOUNTER — Other Ambulatory Visit: Payer: Self-pay | Admitting: Internal Medicine

## 2014-12-04 ENCOUNTER — Telehealth: Payer: Self-pay | Admitting: Internal Medicine

## 2014-12-04 NOTE — Telephone Encounter (Signed)
Spoke with patient , it's xanax he is requesting a refill on.  Informed him that Dr. Carlean Purl is at the hospital this week and will hopefully get him an answer tomorrow when he comes to the office to see patients.

## 2014-12-04 NOTE — Telephone Encounter (Signed)
Patient advised that he should resume furosemide.  She will call back for any additional questions or concerns

## 2014-12-04 NOTE — Telephone Encounter (Signed)
Will not refill until pharmacy requests it when it is time

## 2014-12-04 NOTE — Telephone Encounter (Signed)
Ok to refill [name]? 

## 2014-12-05 ENCOUNTER — Other Ambulatory Visit: Payer: Self-pay | Admitting: Internal Medicine

## 2014-12-05 ENCOUNTER — Telehealth: Payer: Self-pay | Admitting: Internal Medicine

## 2014-12-05 NOTE — Telephone Encounter (Signed)
Pt is requesting a call back about his med

## 2014-12-05 NOTE — Telephone Encounter (Signed)
Pharmacy and patient notified that rx faxed to [facility] in [place].

## 2014-12-11 ENCOUNTER — Other Ambulatory Visit: Payer: Self-pay | Admitting: Internal Medicine

## 2014-12-11 NOTE — Telephone Encounter (Signed)
Please advise [name], thank you. 

## 2014-12-12 NOTE — Telephone Encounter (Signed)
Approve x 12

## 2014-12-23 ENCOUNTER — Ambulatory Visit (INDEPENDENT_AMBULATORY_CARE_PROVIDER_SITE_OTHER): Payer: Medicaid Other | Admitting: Infectious Disease

## 2014-12-23 ENCOUNTER — Encounter: Payer: Self-pay | Admitting: Infectious Disease

## 2014-12-23 VITALS — BP 113/70 | HR 73 | Temp 98.0°F | Ht 71.0 in | Wt 304.0 lb

## 2014-12-23 DIAGNOSIS — K7290 Hepatic failure, unspecified without coma: Secondary | ICD-10-CM | POA: Diagnosis not present

## 2014-12-23 DIAGNOSIS — K7682 Hepatic encephalopathy: Secondary | ICD-10-CM

## 2014-12-23 DIAGNOSIS — K7031 Alcoholic cirrhosis of liver with ascites: Secondary | ICD-10-CM

## 2014-12-23 DIAGNOSIS — I8500 Esophageal varices without bleeding: Secondary | ICD-10-CM | POA: Diagnosis not present

## 2014-12-23 DIAGNOSIS — F411 Generalized anxiety disorder: Secondary | ICD-10-CM | POA: Diagnosis not present

## 2014-12-23 DIAGNOSIS — N62 Hypertrophy of breast: Secondary | ICD-10-CM

## 2014-12-23 DIAGNOSIS — E881 Lipodystrophy, not elsewhere classified: Secondary | ICD-10-CM

## 2014-12-23 DIAGNOSIS — B20 Human immunodeficiency virus [HIV] disease: Secondary | ICD-10-CM

## 2014-12-23 LAB — COMPLETE METABOLIC PANEL WITH GFR
ALBUMIN: 3.9 g/dL (ref 3.5–5.2)
ALT: 19 U/L (ref 0–53)
AST: 26 U/L (ref 0–37)
Alkaline Phosphatase: 101 U/L (ref 39–117)
BUN: 8 mg/dL (ref 6–23)
CALCIUM: 8.5 mg/dL (ref 8.4–10.5)
CO2: 24 mEq/L (ref 19–32)
Chloride: 103 mEq/L (ref 96–112)
Creat: 0.88 mg/dL (ref 0.50–1.35)
GFR, Est African American: >89 mL/min
GLUCOSE: 94 mg/dL (ref 70–99)
POTASSIUM: 3.7 meq/L (ref 3.5–5.3)
SODIUM: 138 meq/L (ref 135–145)
TOTAL PROTEIN: 6.2 g/dL (ref 6.0–8.3)
Total Bilirubin: 1.1 mg/dL (ref 0.2–1.2)

## 2014-12-23 MED ORDER — EMTRICITABINE-TENOFOVIR AF 200-25 MG PO TABS: 1.0000 | ORAL_TABLET | Freq: Every day | ORAL | Status: DC

## 2014-12-23 NOTE — Patient Instructions (Signed)
We are going to change you to   DESCOVY in place of Truvada  Please continue to take the [place] with TRUVADA until you can pickup rx for  DESCOVY  Then change over to [facility] and DESCOVY  This regimen will be safer on your kidneys and bones  Continue to followup closely with Dr Carlean Purl re your liver

## 2014-12-23 NOTE — Progress Notes (Signed)
Subjective:    Patient ID: [name], male    DOB: 1966/02/19, 49 y.o.   MRN: [id number]  HPI   [name] is a 49 y.o. male with HIV infection, cirrhosis,  who had been  doing superbly well on his antiviral regimen ofTivicay and Truvada and his VL remains suppressed and CD4 is above 200.  Lab Results  Component Value Date   HIV1RNAQUANT <20 10/09/2014   Lab Results  Component Value Date   CD4TABS 250* 10/09/2014   CD4TABS 230* 04/02/2014   CD4TABS 240* 08/23/2013     He has been  evaluated at [facility] for possible transplant but tells me they have told him that he is still 10 years out for need for transplant.   He had been having  trouble falling at times as documented in Dr. Celesta Aver note but this has now improved with reduction in benzodiazepene dosage.  He did come into the ED with confusion in context of going on low carbohydrate--high protein diet yet again and now (hopefully understands not to do this given his chronic liver disease).   He still has some significant concerns re his gynecomastia and body image.  Review of Systems  Constitutional: Negative for fever, chills, diaphoresis and unexpected weight change.  HENT: Negative for congestion, rhinorrhea, sinus pressure, sneezing, sore throat and trouble swallowing.   Eyes: Negative for photophobia and visual disturbance.  Respiratory: Negative for cough, chest tightness, shortness of breath, wheezing and stridor.   Cardiovascular: Negative for chest pain, palpitations and leg swelling.  Gastrointestinal: Negative for nausea, vomiting, abdominal pain, diarrhea, constipation, blood in stool, abdominal distention and anal bleeding.  Genitourinary: Negative for dysuria, hematuria, flank pain and difficulty urinating.  Musculoskeletal: Negative for myalgias, back pain, joint swelling, arthralgias and gait problem.  Skin: Negative for color change, pallor, rash and wound.  Neurological: Negative for dizziness,  tremors, seizures, weakness and light-headedness.  Hematological: Negative for adenopathy. Does not bruise/bleed easily.  Psychiatric/Behavioral: Negative for behavioral problems, confusion, sleep disturbance, dysphoric mood, decreased concentration and agitation. The patient is not nervous/anxious.        Objective:   Physical Exam  Constitutional: He is oriented to person, place, and time. He appears well-developed and well-nourished. No distress.  HENT:  Head: Normocephalic and atraumatic.  Mouth/Throat: Oropharynx is clear and moist. No oropharyngeal exudate.  Eyes: Conjunctivae and EOM are normal. Pupils are equal, round, and reactive to light. No scleral icterus.  Neck: Normal range of motion. Neck supple. No JVD present.  Cardiovascular: Normal rate, regular rhythm and normal heart sounds.  Exam reveals no gallop and no friction rub.   No murmur heard. Pulmonary/Chest: Effort normal and breath sounds normal. No respiratory distress. He has no wheezes. He has no rales. He exhibits no tenderness.  Abdominal: Soft. He exhibits distension. He exhibits no mass. There is no tenderness. There is no rebound and no guarding.  Musculoskeletal: He exhibits no edema or tenderness.  Lymphadenopathy:    He has no cervical adenopathy.  Neurological: He is alert and oriented to person, place, and time. No sensory deficit. He exhibits normal muscle tone. Gait normal.  Skin: Skin is warm and dry. He is not diaphoretic. No erythema. No pallor.  Psychiatric: He has a normal mood and affect. His behavior is normal. Judgment and thought content normal.   He has slight tremor of hands bilaterally but no frank "liver flap"       Assessment & Plan:   HIV: continue  TIVICAY and exchange Truvada for DESCOVy for better bone and kidney health     Etoh induced cirrhosis with varices: followed by [facility] team. Need to make sure he continues to take his lactulose and rifaximin  etc.  Anxiety depression: care with benzos given his liver disease. I asked for Grayland Ormond to meet with him to consider initiate CBT with the patient and hope to get him on lower dose of benzos if not off altogether  Gynecomastia:  I would not be in hurry to do surgery if I were him.  Lipodystrophy changes and morbid obesity: see above.  I spent greater than 40 minutes with the patient including greater than 50% of time in face to face counsel of the patient re his HIV, Etoh induced cirrhosis, anxiety, depression, gynecomastia, lipodystrophy and in coordination of their care.

## 2014-12-23 NOTE — Progress Notes (Signed)
Patient ID: [name], male   DOB: 1965-11-15, 49 y.o.   MRN: [id number] "warm handoff" - met with Remo Lipps briefly in exam room to introduce myself.  I spoke briefly about diet and weight issues, which he is concerned about.  I informed him of my hypnosis training and that I would be glad to help him with this using this modality if he is interested, or to just come in and talk about the issues he is confronting.  He was very receptive. Curley Spice, LCSW

## 2014-12-24 LAB — CBC WITH DIFFERENTIAL/PLATELET
BASOS ABS: 0 10*3/uL (ref 0.0–0.1)
Basophils Relative: 1 % (ref 0–1)
Eosinophils Absolute: 0.1 10*3/uL (ref 0.0–0.7)
Eosinophils Relative: 3 % (ref 0–5)
HEMATOCRIT: 35.3 % — AB (ref 39.0–52.0)
HEMOGLOBIN: 11.6 g/dL — AB (ref 13.0–17.0)
Lymphocytes Relative: 28 % (ref 12–46)
Lymphs Abs: 1 10*3/uL (ref 0.7–4.0)
MCH: 26.5 pg (ref 26.0–34.0)
MCHC: 32.9 g/dL (ref 30.0–36.0)
MCV: 80.8 fL (ref 78.0–100.0)
MONOS PCT: 9 % (ref 3–12)
MPV: 10.7 fL (ref 8.6–12.4)
Monocytes Absolute: 0.3 10*3/uL (ref 0.1–1.0)
NEUTROS PCT: 59 % (ref 43–77)
Neutro Abs: 2.2 10*3/uL (ref 1.7–7.7)
Platelets: 81 10*3/uL — ABNORMAL LOW (ref 150–400)
RBC: 4.37 MIL/uL (ref 4.22–5.81)
RDW: 17.7 % — AB (ref 11.5–15.5)
WBC: 3.7 10*3/uL — ABNORMAL LOW (ref 4.0–10.5)

## 2014-12-24 LAB — T-HELPER CELL (CD4) - (RCID CLINIC ONLY)
CD4 T CELL ABS: 240 /uL — AB (ref 400–2700)
CD4 T CELL HELPER: 24 % — AB (ref 33–55)

## 2014-12-24 LAB — HIV-1 RNA ULTRAQUANT REFLEX TO GENTYP+
HIV 1 RNA QUANT: 33 {copies}/mL — AB (ref <20)
HIV-1 RNA Quant, Log: 1.52 {Log} — ABNORMAL HIGH (ref <1.30)

## 2014-12-31 ENCOUNTER — Encounter: Payer: Self-pay | Admitting: Internal Medicine

## 2014-12-31 ENCOUNTER — Ambulatory Visit (INDEPENDENT_AMBULATORY_CARE_PROVIDER_SITE_OTHER): Payer: Medicaid Other | Admitting: Internal Medicine

## 2014-12-31 VITALS — BP 110/70 | HR 68 | Ht 70.75 in | Wt 307.5 lb

## 2014-12-31 DIAGNOSIS — K7031 Alcoholic cirrhosis of liver with ascites: Secondary | ICD-10-CM | POA: Diagnosis not present

## 2014-12-31 DIAGNOSIS — F411 Generalized anxiety disorder: Secondary | ICD-10-CM

## 2014-12-31 DIAGNOSIS — W19XXXD Unspecified fall, subsequent encounter: Secondary | ICD-10-CM

## 2014-12-31 DIAGNOSIS — R609 Edema, unspecified: Secondary | ICD-10-CM

## 2014-12-31 DIAGNOSIS — K7682 Hepatic encephalopathy: Secondary | ICD-10-CM

## 2014-12-31 DIAGNOSIS — N528 Other male erectile dysfunction: Secondary | ICD-10-CM

## 2014-12-31 DIAGNOSIS — K5900 Constipation, unspecified: Secondary | ICD-10-CM | POA: Diagnosis not present

## 2014-12-31 DIAGNOSIS — N529 Male erectile dysfunction, unspecified: Secondary | ICD-10-CM

## 2014-12-31 DIAGNOSIS — K7290 Hepatic failure, unspecified without coma: Secondary | ICD-10-CM

## 2014-12-31 DIAGNOSIS — R600 Localized edema: Secondary | ICD-10-CM

## 2014-12-31 HISTORY — DX: Localized edema: R60.0

## 2014-12-31 MED ORDER — AMILORIDE HCL 5 MG PO TABS: 15.0000 mg | ORAL_TABLET | Freq: Every day | ORAL | Status: DC

## 2014-12-31 MED ORDER — SENNA 8.6 MG PO TABS: 2.0000 | ORAL_TABLET | Freq: Every day | ORAL | Status: DC

## 2014-12-31 NOTE — Patient Instructions (Addendum)
  Please come back in 2 weeks and have blood work drawn.  No appointment needed, the lab is open 7:30AM-5:30PM.  We have put you in the system for an EGD recall for March 2017.     Try to avoid the sauna and steam room at the gym.   We have sent the following medications to your pharmacy for you to pick up at your convenience: Senakot  Increase your Amiloride to 3 tablets daily.  Follow up with Dr. Carlean Purl in 3 months, patient to call back.   I appreciate the opportunity to care for you. Silvano Rusk, MD, [facility]

## 2014-12-31 NOTE — Assessment & Plan Note (Addendum)
better at this time continue current therapy

## 2014-12-31 NOTE — Assessment & Plan Note (Addendum)
Better on 1/2 1/2 1 alprazolam I appreciate Dr. Cruzita Lederer Dahms efforts to get cognitive behavioral therapy for the patient. Anything the could help with that. Unfortunately he is been on benzodiazepine so long weaning off of these could be problematic. Right now they do not seem to be causing significant adverse effects.

## 2014-12-31 NOTE — Assessment & Plan Note (Addendum)
Add senokot 1-2 each day to try to avoid the enema that his mom as to give him. He is on a very high dose of lactulose and is not encephalopathic and I'm not inclined to increase that though that is an option.

## 2014-12-31 NOTE — Assessment & Plan Note (Addendum)
Increase amiloride first Bmet 2 weeks He does have bilateral ankle edema right greater than left, perhaps he has more on the right because of previous injury there. I'm not sure if podiatry will make a difference but we'll see what they say

## 2014-12-31 NOTE — Assessment & Plan Note (Signed)
observe

## 2014-12-31 NOTE — Progress Notes (Signed)
Subjective:    Patient ID: [name], male    DOB: 11-25-1965, 49 y.o.   MRN: [id number] Cc: f/u cirrhosis HPI  [name] is here with his mom. Overall he is improved. He had been having falls and confusion. His problems include: Constipation - needs an enema most nights Went into sauna and steam room at the gym, and his mom does not think he should do that. Is exercising still and better with this, though when he gets off the Clarence cycle he is a little wobbly Erectile dysfunction concerns: Ejaculating when masturbating but having some problems maintaining erection - does date some - does have a partner and has intercourse - using condoms Energy level is better. He is sleeping better.  Anxiety, he is taking a total of 2 mg alprazolam daily but using a half a milligram twice a day and then 1 mg at bedtime with success.  He is having some ankle swelling right more so than left. There is some red discoloration in the pretibial area that he thinks is a rash perhaps. He says pudding and in the coronary water at the pole made it better. He saw orthopedics who referred him to a podiatrist as she is fractured that foot before.  Allergies  Allergen Reactions  . Penicillins     REACTION: hives   Outpatient Prescriptions Prior to Visit  Medication Sig Dispense Refill  . acyclovir (ZOVIRAX) 400 MG tablet Take 400 mg by mouth 2 (two) times daily as needed (cold sores).     . ALPRAZolam (XANAX) 1 MG tablet TAKE 1 TABLET BY MOUTH TWICE A DAY 60 tablet 2  . amitriptyline (ELAVIL) 100 MG tablet TAKE 1 TABLET BY MOUTH AT BEDTIME 30 tablet 11  . Docusate Calcium (STOOL SOFTENER PO) Take 3 tablets by mouth at bedtime.    . dolutegravir (TIVICAY) 50 MG tablet Take 1 tablet (50 mg total) by mouth daily. 30 tablet 11  . emtricitabine-tenofovir AF (DESCOVY) 200-25 MG per tablet Take 1 tablet by mouth daily. 30 tablet 11  . esomeprazole (NEXIUM) 40 MG capsule TAKE ONE CAPSULE BY MOUTH EVERY DAY BEFORE  BREAKFAST 30 capsule 0  . furosemide (LASIX) 40 MG tablet Take 1 tablet (40 mg total) by mouth 2 (two) times daily. 60 tablet 7  . lactulose (CHRONULAC) 10 GM/15ML solution TAKE 60 ML BY MOUTH 4 TIMES DAILY. 7316 mL 11  . Polyethyl Glycol-Propyl Glycol (SYSTANE OP) Apply 1 drop to eye as needed (dry eyes).    . polyethylene glycol powder (MIRALAX) powder Take 17 g by mouth 2 (two) times daily. 850 g 11  . potassium chloride SA (K-DUR,KLOR-CON) 20 MEQ tablet Take 10 mEq by mouth daily.     . sodium phosphate (FLEET) enema Place 1 enema rectally daily. Takes at night    . SSD 1 % cream Apply 1 application topically daily as needed (Fever blisters.).   0  . XIFAXAN 550 MG TABS tablet TAKE 1 TABLET BY MOUTH TWICE DAILY 60 tablet 5  . aMILoride (MIDAMOR) 5 MG tablet take two tablet by mouth every morning. 60 tablet 5  . PROAIR HFA 108 (90 BASE) MCG/ACT inhaler Inhale 2 puffs into the lungs every 6 (six) hours as needed for wheezing or shortness of breath (wheezing).   0   No facility-administered medications prior to visit.   Past Medical History  Diagnosis Date  . HIV DISEASE 06/23/2006  . HYPERLIPIDEMIA 06/23/2006  . ANXIETY 06/23/2006  . DEPRESSION 06/23/2006  .  HYPERTENSION 06/23/2006  . 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 Ascites 11/13/2009  . STRAIN, CHEST WALL 03/15/2007  . 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 Gastric ulcer 06/2010  . Alcoholic cirrhosis of liver   . Alcoholism, chronic   . Iron deficiency anemia   . GERD (gastroesophageal reflux disease)   . SBP (spontaneous bacterial peritonitis) 05/03/2011    Suspected by high leukocytes on paracentesis. Clinical scenario also compatible. November 2012 responded to Levaquin. Started on trimethoprim-sulfamethoxazole double strength daily for prophylaxis.   . Obesity (BMI 30-39.9)     BMI 34 kg/m^2  . Left foot infection   . Ascites   . Portal hypertensive  gastropathy 01/02/2013  . Blood dyscrasia     HIV  . Skin cancer     left calf  . 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 Avulsion fracture of middle phalanges of  3rd/4th fingers left hand 01/07/2014   Past Surgical History  Procedure Laterality Date  . Esophagogastroduodenoscopy w/ banding  06/26/2010    variceal ligation  . Carpal tunnel release      left  . Esophagogastroduodenoscopy  07/01/2010;  08/12/10    small varices, gastric ulcer  . Esophagogastroduodenoscopy  10/26/2011    Procedure: ESOPHAGOGASTRODUODENOSCOPY (EGD);  Surgeon: Gatha Mayer, MD;  Location: [facility];  Service: Endoscopy;  Laterality: N/A;  . Upper gastrointestinal endoscopy    . Colonoscopy  march 2013  . Esophagogastroduodenoscopy N/A 01/02/2013    Procedure: ESOPHAGOGASTRODUODENOSCOPY (EGD);  Surgeon: Gatha Mayer, MD;  Location: [facility];  Service: Endoscopy;  Laterality: N/A;  . Gastric varices banding N/A 01/02/2013    Procedure: GASTRIC VARICES BANDING;  Surgeon: Gatha Mayer, MD;  Location: [facility];  Service: Endoscopy;  Laterality: N/A;  possible banding  . Esophagogastroduodenoscopy N/A 04/23/2013    Procedure: ESOPHAGOGASTRODUODENOSCOPY (EGD);  Surgeon: Gatha Mayer, MD;  Location: [facility];  Service: Endoscopy;  Laterality: N/A;  . Esophagogastroduodenoscopy N/A 05/01/2013    Procedure: ESOPHAGOGASTRODUODENOSCOPY (EGD);  Surgeon: Gatha Mayer, MD;  Location: [facility];  Service: Endoscopy;  Laterality: N/A;  follow-up varices and possibly band them  . Esophageal banding  05/01/2013    Procedure: ESOPHAGEAL BANDING;  Surgeon: Gatha Mayer, MD;  Location: [facility];  Service: Endoscopy;;  . Esophagogastroduodenoscopy N/A 09/04/2013    Procedure: ESOPHAGOGASTRODUODENOSCOPY (EGD);  Surgeon: Gatha Mayer, MD;  Location: [facility];  Service: Endoscopy;  Laterality: N/A;  . Esophagogastroduodenoscopy N/A 09/10/2014    Procedure:  ESOPHAGOGASTRODUODENOSCOPY (EGD);  Surgeon: Gatha Mayer, MD;  Location: [facility];  Service: Endoscopy;  Laterality: N/A;   History   Social History  . Marital Status: Single    Spouse Name: N/A  . Number of Children: N/A  . Years of Education: N/A   Occupational History  . disability    Social History Main Topics  . Smoking status: Former Smoker -- 0.10 packs/day for 10 years    Types: Cigarettes    Start date: 03/03/2014    Quit date: 04/03/2014  . Smokeless tobacco: Never Used     Comment: form given 05-19-11  . Alcohol Use: No     Comment: pt is an alcoholic currently in [employer]  . Drug Use: No  . Sexual Activity:    Partners: Male    Birth Control/ Protection: Condom     Comment: pt. declined condoms   Other Topics Concern  . None   Social  History Narrative   Single, disabled hair stylist   Lives with parents in a home with a basement.  Parents do not like for him to go down stairs because they are steep.              Family History  Problem Relation Age of Onset  . Hyperlipidemia Mother   . Hypertension Mother   . Breast cancer Mother     questionable  . Heart disease Mother   . Hypertension Father   . Irritable bowel syndrome Father   . Drug abuse Brother   . Heart disease Brother   . Breast cancer Maternal Aunt     maternal great aunt  . Colon cancer Neg Hx   . Alcohol abuse Other   . Heart disease Maternal Uncle   . Irritable bowel syndrome Paternal Aunt         Review of Systems As per history of present illness. He had a recent ID visit and got a good report overall. Dr. Cruzita Lederer Dalm is trying to refer him to cognitive-based therapy to see if he continue that and come off the benzodiazepine's. All other review of systems as above or negative.    Objective:   Physical Exam @BP  110/70 mmHg  Pulse 68  Ht 5' 10.75" (1.797 m)  Wt 307 lb 8 oz (139.481 kg)  BMI 43.19 kg/m2@  General:  Well-developed, well-nourished and in no  acute distress -obese Eyes:  anicteric. Lungs: Clear to auscultation bilaterally. Heart:  S1S2, no rubs, murmurs, gallops. Abdomen:  obese soft, non-tender, no hepatosplenomegaly, hernia, or mass and BS+.  Extremities:   Trace bilat LE ankle edema R>L  no cyanosis or clubbing Skin   mild purpuric like changes  Neuro:  A&O x 3. No asterixis Psych:  appropriate mood and  Affect.   Data Reviewed: Most recent labs in the EMR. This includes HIV studies CBC in CVAT. EGD 03//2016  CT scan January 2016. Abdomen and pelvis study.  Wt Readings from Last 3 Encounters:  12/31/14 307 lb 8 oz (139.481 kg)  12/23/14 304 lb (137.893 kg)  10/09/14 302 lb 8 oz (137.213 kg)       Assessment & Plan:  Alcoholic cirrhosis of liver Stable overall. No signs of new decompensation he has a history of ascites and encephalopathy and variceal bleeding in these are all under control at this time as best I can tell. He will return for routine visit in about 3 months.  Constipation Add senokot 1-2 each day to try to avoid the enema that his mom as to give him. He is on a very high dose of lactulose and is not encephalopathic and I'm not inclined to increase that though that is an option.  ENCEPHALOPATHY, HEPATIC better at this time continue current therapy  Anxiety state Better on 1/2 1/2 1 alprazolam I appreciate Dr. Alfonzo Beers efforts to get cognitive behavioral therapy for the patient. Anything the could help with that. Unfortunately he is been on benzodiazepine so long weaning off of these could be problematic. Right now they do not seem to be causing significant adverse effects.  ERECTILE DYSFUNCTION, ORGANIC observe  Peripheral edema Increase amiloride first Bmet 2 weeks He does have bilateral ankle edema right greater than left, perhaps he has more on the right because of previous injury there. I'm not sure if podiatry will make a difference but we'll see what they say  Falls Better, is to pursue  physical therapy at [facility]  apparently. Has not had as many falls lately perhaps one two weeks ago. He was advised to get off the gym equipment very slowly and taking his time to avoid falls.    I appreciate the opportunity to care for this patient. CC: Greig Right, MD Alcide Evener, MD

## 2014-12-31 NOTE — Assessment & Plan Note (Addendum)
Stable overall. No signs of new decompensation he has a history of ascites and encephalopathy and variceal bleeding in these are all under control at this time as best I can tell. He will return for routine visit in about 3 months.

## 2014-12-31 NOTE — Assessment & Plan Note (Signed)
Better, is to pursue physical therapy at [facility] apparently. Has not had as many falls lately perhaps one two weeks ago. He was advised to get off the gym equipment very slowly and taking his time to avoid falls.

## 2015-01-01 ENCOUNTER — Telehealth: Payer: Self-pay | Admitting: Internal Medicine

## 2015-01-01 NOTE — Telephone Encounter (Signed)
[name] is advised to take the senokot along with the Miralax and avoid enemas.  All questions answered

## 2015-01-01 NOTE — Telephone Encounter (Signed)
Left message for patient to call back  

## 2015-01-03 ENCOUNTER — Other Ambulatory Visit: Payer: Self-pay | Admitting: Internal Medicine

## 2015-01-03 ENCOUNTER — Other Ambulatory Visit: Payer: Self-pay

## 2015-01-03 MED ORDER — ESOMEPRAZOLE MAGNESIUM 40 MG PO CPDR
40.0000 mg | DELAYED_RELEASE_CAPSULE | Freq: Every day | ORAL | Status: DC
Start: 2015-01-03 — End: 2017-10-21

## 2015-01-03 NOTE — Telephone Encounter (Signed)
Pharmacy called to see if patient may take generic Nexium , he's insurance requires that.  Ok per Dr. Carlean Purl.  Verbal ok given to Mickey at [facility].

## 2015-01-21 ENCOUNTER — Other Ambulatory Visit (INDEPENDENT_AMBULATORY_CARE_PROVIDER_SITE_OTHER): Payer: Medicaid Other

## 2015-01-21 ENCOUNTER — Telehealth: Payer: Self-pay | Admitting: Internal Medicine

## 2015-01-21 DIAGNOSIS — K7031 Alcoholic cirrhosis of liver with ascites: Secondary | ICD-10-CM | POA: Diagnosis not present

## 2015-01-21 DIAGNOSIS — K7290 Hepatic failure, unspecified without coma: Secondary | ICD-10-CM | POA: Diagnosis not present

## 2015-01-21 DIAGNOSIS — K7682 Hepatic encephalopathy: Secondary | ICD-10-CM

## 2015-01-21 LAB — BASIC METABOLIC PANEL
BUN: 10 mg/dL (ref 6–23)
CO2: 27 mEq/L (ref 19–32)
Calcium: 10 mg/dL (ref 8.4–10.5)
Chloride: 106 mEq/L (ref 96–112)
Creatinine, Ser: 0.96 mg/dL (ref 0.40–1.50)
GFR: 88.05 mL/min (ref >60.00)
Glucose, Bld: 143 mg/dL — ABNORMAL HIGH (ref 70–99)
Potassium: 3.6 mEq/L (ref 3.5–5.1)
SODIUM: 141 meq/L (ref 135–145)

## 2015-01-21 NOTE — Telephone Encounter (Signed)
I left a message with his mother that the labs have not resulted and that I will call with results when Dr. Carlean Purl reviews

## 2015-01-22 ENCOUNTER — Telehealth: Payer: Self-pay | Admitting: Internal Medicine

## 2015-01-22 NOTE — Progress Notes (Signed)
Quick Note:  Labs ok on increased amiloride Is edema better? ______

## 2015-01-22 NOTE — Telephone Encounter (Signed)
I spoke with the patient's mother. She reports that "he just is not doing good".  Reports that he is excessively lethargic, very "shaky", fell twice last night.  She is having to wake him up to eat.  He has not had a BM in 2 days despite lactulose 1 tbsp 4 x a day, BID miralax, and 2 enemas.  Mom reports that she is having to wake him to eat, "then all he does is sleep". I asked her to have him hold up his arms to assess for liver flap, but she says " he is asleep again"  Dr. Carlean Purl please advise

## 2015-01-22 NOTE — Telephone Encounter (Signed)
Discussed Needs evaluation tomorrow in office and if worse before then to ED To bring all medications with him to visit

## 2015-01-22 NOTE — Telephone Encounter (Signed)
Patient answered when I called back and declined appt in the office, said he is sleepy because he has been taking too much amitriptyline.  His mother called back and says she will get him here tomorrow.  He is scheduled for tomorrow with Alonza Bogus, PA

## 2015-01-23 ENCOUNTER — Ambulatory Visit: Payer: Medicaid Other | Admitting: Gastroenterology

## 2015-01-23 ENCOUNTER — Ambulatory Visit (INDEPENDENT_AMBULATORY_CARE_PROVIDER_SITE_OTHER): Payer: Medicaid Other | Admitting: Gastroenterology

## 2015-01-23 ENCOUNTER — Encounter: Payer: Self-pay | Admitting: Gastroenterology

## 2015-01-23 ENCOUNTER — Other Ambulatory Visit (INDEPENDENT_AMBULATORY_CARE_PROVIDER_SITE_OTHER): Payer: Medicaid Other

## 2015-01-23 VITALS — BP 124/82 | HR 80 | Ht 70.75 in | Wt 294.0 lb

## 2015-01-23 DIAGNOSIS — K7030 Alcoholic cirrhosis of liver without ascites: Secondary | ICD-10-CM

## 2015-01-23 DIAGNOSIS — K7682 Hepatic encephalopathy: Secondary | ICD-10-CM

## 2015-01-23 DIAGNOSIS — K7290 Hepatic failure, unspecified without coma: Secondary | ICD-10-CM | POA: Diagnosis not present

## 2015-01-23 HISTORY — DX: Hepatic encephalopathy: K76.82

## 2015-01-23 LAB — AMMONIA: AMMONIA: 144 umol/L — AB (ref 11–35)

## 2015-01-23 MED ORDER — AMBULATORY NON FORMULARY MEDICATION: Status: DC

## 2015-01-23 NOTE — Patient Instructions (Signed)
Your physician has requested that you go to the basement for the following lab work before leaving today: Ammonia  We have given you a prescription for a lift chair to take to the medical supply store.

## 2015-01-23 NOTE — Progress Notes (Signed)
     01/23/2015 [name] [id number] Feb 18, 1966   History of Present Illness:  This is a pleasant 49 year old male who is well-known to Dr. Carlean Purl for treatment of his decompensated cirrhosis. He was put on my schedule today as an urgent add-on for confusion and lethargy. He is here today with his elderly parents. He has a history of hepatic encephalopathy and is on Xifaxan 550 mg twice daily along with lactulose 60 mL 4 times daily. He takes several other laxatives as well including MiraLAX twice daily, stool softeners daily, and 2 Senokot daily, but still does not move his bowels on a regular basis. His mother often has to give him enemas to help him move his bowels at which time he will have a large stool.  His mother reports that for the last several days he's been very confused, very shaky, having some falls, having a hard time getting up out of the chair, and is very sleepy. He is taking Xanax and amitriptyline, but we did count his pills today and overall he is taking them as prescribed, except he admits to taking 1-1/2 amitriptyline at bedtime and set of only 1 pill at times.   Current Medications, Allergies, Past Medical History, Past Surgical History, Family History and Social History were reviewed in [employer] record.   Physical Exam: BP 124/82 mmHg  Pulse 80  Ht 5' 10.75" (1.797 m)  Wt 294 lb (133.358 kg)  BMI 41.30 kg/m2 General: Well developed white male in no acute distress Head: Normocephalic and atraumatic Eyes:  Sclerae anicteric, conjunctiva pink  Ears: Normal auditory acuity Lungs: Clear throughout to auscultation Heart: Regular rate and rhythm Abdomen: Soft, obese, non-distended.  Normal bowel sounds.  Non-tender. Musculoskeletal: Symmetrical with no gross deformities  Extremities: No edema  Neurological: Alert oriented x 4.  Asterixis is present.  Slow mentation but conversing well. Psychological:  Alert and cooperative. Normal mood  and affect.  Assessment and Recommendations: -Cirrhosis with HE:  Recent confusion and lethargy likely secondary to HE/elevated ammonia levels.  Has asterixis.  Will check an ammonia level today. If it is elevated I will discuss with Dr. Carlean Purl regarding recommendations since he is a already on Xifaxan twice daily and lactulose 4 times a day, along with several other laxatives, but not moving his bowels regularly.  They're also asking if we can write a prescription for a lift chair so that he can get up easier on his own at home.

## 2015-01-28 ENCOUNTER — Other Ambulatory Visit: Payer: Self-pay | Admitting: *Deleted

## 2015-01-28 ENCOUNTER — Telehealth: Payer: Self-pay | Admitting: Internal Medicine

## 2015-01-28 MED ORDER — PEG 3350-KCL-NABCB-NACL-NASULF 236 G PO SOLR: 4000.0000 mL | Freq: Once | ORAL | Status: DC

## 2015-01-28 NOTE — Telephone Encounter (Signed)
Rx sent to pharmacy. Spoke with patient's mother and gave her recommendations. She will call back later this week with update.

## 2015-01-28 NOTE — Telephone Encounter (Signed)
Spoke with Dr. Carlean Purl.  He recommended a bowel purge.  Said to take 4 dulcolax and to drink a Go-lytely bowel prep (will need to send script to pharmacy).  Dr. Carlean Purl said for them to call back later this week after doing that with an update.  This update should be sent to him for him to handle from here on out.  Thank you,  Jess

## 2015-01-28 NOTE — Telephone Encounter (Signed)
Patient was seen last week. She is wondering what Dr. Carlean Purl said about patient. He is sleeping a lot and stomach is "sour." No stools unless she gives him an enema. Monday gave him Lactulose TID, Miralax BID, laxative and prunes. Please, advise.

## 2015-01-29 ENCOUNTER — Telehealth: Payer: Self-pay | Admitting: Internal Medicine

## 2015-01-29 NOTE — Telephone Encounter (Signed)
Mom had questions about bowel purge.  All questions answered

## 2015-01-31 ENCOUNTER — Telehealth: Payer: Self-pay | Admitting: Internal Medicine

## 2015-01-31 NOTE — Telephone Encounter (Signed)
Patient's mother is advised to continue his bowel regimen as previously ordered

## 2015-02-03 NOTE — Progress Notes (Signed)
Agree with Ms. Zehr's management.  Ravinder Hofland E. Annaston Upham, MD, FACG  

## 2015-02-12 ENCOUNTER — Other Ambulatory Visit: Payer: Self-pay | Admitting: Internal Medicine

## 2015-02-26 ENCOUNTER — Other Ambulatory Visit: Payer: Self-pay | Admitting: Internal Medicine

## 2015-02-26 NOTE — Telephone Encounter (Signed)
Please advise [name], thank you. 

## 2015-02-27 NOTE — Telephone Encounter (Signed)
This looks early - ok to refill x 2 if not early otherwise wait until full month gone by

## 2015-03-05 ENCOUNTER — Other Ambulatory Visit: Payer: Self-pay | Admitting: Internal Medicine

## 2015-03-28 ENCOUNTER — Other Ambulatory Visit: Payer: Self-pay | Admitting: Internal Medicine

## 2015-03-28 NOTE — Telephone Encounter (Signed)
May I refill, mom says she is only giving him 1 and 1/2 tablets a day.  Please advise.  Thx.

## 2015-03-30 NOTE — Telephone Encounter (Signed)
Ok to refill x 3 

## 2015-03-31 NOTE — Telephone Encounter (Signed)
Just to clarify it is 2 tablets qd?

## 2015-04-10 ENCOUNTER — Telehealth: Payer: Self-pay | Admitting: Internal Medicine

## 2015-04-10 NOTE — Telephone Encounter (Signed)
Patient's mom calling to set up follow up.  He is scheduled for 04/15/15 4:00

## 2015-04-14 ENCOUNTER — Other Ambulatory Visit: Payer: Self-pay

## 2015-04-14 MED ORDER — RIFAXIMIN 550 MG PO TABS: 550.0000 mg | ORAL_TABLET | Freq: Two times a day (BID) | ORAL | Status: DC

## 2015-04-14 NOTE — Telephone Encounter (Signed)
Received prior authorization for xifaxan from [place].  Re-Sent rx to [facility] to let them do.

## 2015-04-15 ENCOUNTER — Encounter: Payer: Self-pay | Admitting: Internal Medicine

## 2015-04-15 ENCOUNTER — Ambulatory Visit (INDEPENDENT_AMBULATORY_CARE_PROVIDER_SITE_OTHER): Payer: Medicaid Other | Admitting: Internal Medicine

## 2015-04-15 ENCOUNTER — Other Ambulatory Visit (INDEPENDENT_AMBULATORY_CARE_PROVIDER_SITE_OTHER): Payer: Medicaid Other

## 2015-04-15 VITALS — BP 120/80 | HR 80 | Ht 70.75 in | Wt 302.0 lb

## 2015-04-15 DIAGNOSIS — N3941 Urge incontinence: Secondary | ICD-10-CM | POA: Diagnosis not present

## 2015-04-15 DIAGNOSIS — K7682 Hepatic encephalopathy: Secondary | ICD-10-CM

## 2015-04-15 DIAGNOSIS — K7290 Hepatic failure, unspecified without coma: Secondary | ICD-10-CM | POA: Diagnosis not present

## 2015-04-15 DIAGNOSIS — K7030 Alcoholic cirrhosis of liver without ascites: Secondary | ICD-10-CM

## 2015-04-15 LAB — AMMONIA: AMMONIA: 89 umol/L — AB (ref 11–35)

## 2015-04-15 LAB — CBC WITH DIFFERENTIAL/PLATELET
Basophils Absolute: 0 10*3/uL (ref 0.0–0.1)
Basophils Relative: 0.4 % (ref 0.0–3.0)
EOS ABS: 0.1 10*3/uL (ref 0.0–0.7)
Eosinophils Relative: 2.6 % (ref 0.0–5.0)
HCT: 38.4 % — ABNORMAL LOW (ref 39.0–52.0)
HEMOGLOBIN: 12.4 g/dL — AB (ref 13.0–17.0)
LYMPHS ABS: 1.1 10*3/uL (ref 0.7–4.0)
Lymphocytes Relative: 31.5 % (ref 12.0–46.0)
MCHC: 32.3 g/dL (ref 30.0–36.0)
MCV: 82.3 fl (ref 78.0–100.0)
Monocytes Absolute: 0.3 10*3/uL (ref 0.1–1.0)
Monocytes Relative: 9 % (ref 3.0–12.0)
Neutro Abs: 2 10*3/uL (ref 1.4–7.7)
Neutrophils Relative %: 56.5 % (ref 43.0–77.0)
PLATELETS: 83 10*3/uL — AB (ref 150.0–400.0)
RBC: 4.67 Mil/uL (ref 4.22–5.81)
RDW: 18.5 % — ABNORMAL HIGH (ref 11.5–15.5)
WBC: 3.5 10*3/uL — AB (ref 4.0–10.5)

## 2015-04-15 LAB — COMPREHENSIVE METABOLIC PANEL
ALBUMIN: 4 g/dL (ref 3.5–5.2)
ALT: 23 U/L (ref 0–53)
AST: 29 U/L (ref 0–37)
Alkaline Phosphatase: 115 U/L (ref 39–117)
BUN: 8 mg/dL (ref 6–23)
CHLORIDE: 108 meq/L (ref 96–112)
CO2: 25 meq/L (ref 19–32)
CREATININE: 0.92 mg/dL (ref 0.40–1.50)
Calcium: 9.8 mg/dL (ref 8.4–10.5)
GFR: 92.39 mL/min (ref >60.00)
GLUCOSE: 89 mg/dL (ref 70–99)
POTASSIUM: 3.9 meq/L (ref 3.5–5.1)
SODIUM: 142 meq/L (ref 135–145)
Total Bilirubin: 1.2 mg/dL (ref 0.2–1.2)
Total Protein: 7 g/dL (ref 6.0–8.3)

## 2015-04-15 LAB — PROTIME-INR
INR: 1.2 ratio — AB (ref 0.8–1.0)
Prothrombin Time: 13.6 s — ABNORMAL HIGH (ref 9.6–13.1)

## 2015-04-15 NOTE — Progress Notes (Signed)
Subjective:    Patient ID: [name], male    DOB: 12-Apr-1966, 49 y.o.   MRN: [id number] Cc: f/u cirrhosis  HPI Here w/ mom Has been a bit more lethargic, intermittent mild confusion, unsteady on feet Fell and abraded right shin [place] [facility] [place] [employer] this past [place] Having urinary incontinence at times, no dysuria/pain Still talking about having breast reduction surgery Constipation remains an issue - mom gives intermittent enemas at night a few times a week if inadequate defecation Xifaxan not filed and he is out x 2 d - sounds like prior auth needed Medications, allergies, past medical history, past surgical history, family history and social history are reviewed and updated in the EMR.  Review of Systems As above, all other ROS negative    Objective:   Physical Exam _0  120/80 mmHg  Pulse 80  Ht 5' 10.75" (1.797 m)  Wt 302 lb (136.986 kg)  BMI 42.42 kg/m2@  General:  Well-developed, well-nourished and in no acute distress - obese - in wheelchair Eyes:  anicteric. Lungs: Clear to auscultation bilaterally. Heart:  S1S2, no rubs, murmurs, gallops. Abdomen:  obese soft, non-tender,Lymph:  no cervical or supraclavicular adenopathy. Extremities:   Tr bilat LE edema, no cyanosis or clubbing  Skin   no rash.+ mild abrasions right shin Neuro:  A&O x 3. Speech clear, mild asterixis is present  Psych:  appropriate mood and  Affect.   Data Reviewed:  Wt Readings from Last 3 Encounters:  04/15/15 302 lb (136.986 kg)  01/23/15 294 lb (133.358 kg)  12/31/14 307 lb 8 oz (139.481 kg)   July las, ID notes reviewed      Assessment & Plan:  Alcoholic cirrhosis of liver without ascites ([place]) - Plan: CBC w/Diff, Comp Met (CMET), Ammonia, INR/PT  Encephalopathy, hepatic (HCC)  Urge incontinence of urine - Plan: Urinalysis, Routine w reflex microscopic   He is stable to somewhat worse - I think unsteadiness due to his peripheral neuropathy more than anything Will  continue current Rx. Samples of Xifaxan given and will do prior auth As best I know he is taking all his lactulose - and the Xifaxan when he has - given amount of lactulose he is on I have been surprised he is constipated Will check labs today (see results below) See me 3 months sooner prn At next visit I should f/u re: is he making it ok at home - seems like mom is managing but when he deteriorates there will be  Point that she will not be able to care for him or when she and husband age/worsen Need to review code status also   Lab Results  Component Value Date   WBC 3.5* 04/15/2015   HGB 12.4* 04/15/2015   HCT 38.4* 04/15/2015   MCV 82.3 04/15/2015   PLT 83.0* 04/15/2015     Chemistry      Component Value Date/Time   NA 142 04/15/2015 1712   K 3.9 04/15/2015 1712   CL 108 04/15/2015 1712   CO2 25 04/15/2015 1712   BUN 8 04/15/2015 1712   CREATININE 0.92 04/15/2015 1712   CREATININE 0.88 12/23/2014 1356      Component Value Date/Time   CALCIUM 9.8 04/15/2015 1712   ALKPHOS 115 04/15/2015 1712   AST 29 04/15/2015 1712   ALT 23 04/15/2015 1712   BILITOT 1.2 04/15/2015 1712     Lab Results  Component Value Date   INR 1.2* 04/15/2015   INR 1.2*  10/21/2014   INR 1.32 10/09/2014   NH3 89 - better than 2 months ago was 144   Current outpatient prescriptions:  .  acyclovir ointment (ZOVIRAX) 5 %, APPLY SUFFICIENT AMOUNT TO AA 5 TIMES A DAY FOR 5 DAYS, Disp: , Rfl: 1 .  ALPRAZolam (XANAX) 1 MG tablet, TAKE 1 TABLET BY MOUTH TWICE DAILY, Disp: 60 tablet, Rfl: 1 .  AMBULATORY NON FORMULARY MEDICATION, Lift Chair- Use as needed Dx: generalized weakness, hepatic encephalopathy, Disp: 1 each, Rfl: 0 .  aMILoride (MIDAMOR) 5 MG tablet, Take 3 tablets (15 mg total) by mouth daily., Disp: 90 tablet, Rfl: 5 .  amitriptyline (ELAVIL) 100 MG tablet, TAKE 1 TABLET BY MOUTH AT BEDTIME, Disp: 30 tablet, Rfl: 11 .  Docusate Calcium (STOOL SOFTENER PO), Take 3 tablets by mouth at bedtime.,  Disp: , Rfl:  .  dolutegravir (TIVICAY) 50 MG tablet, Take 1 tablet (50 mg total) by mouth daily., Disp: 30 tablet, Rfl: 11 .  emtricitabine-tenofovir AF (DESCOVY) 200-25 MG per tablet, Take 1 tablet by mouth daily., Disp: 30 tablet, Rfl: 11 .  esomeprazole (NEXIUM) 40 MG capsule, Take 1 capsule (40 mg total) by mouth daily before breakfast., Disp: 30 capsule, Rfl: 0 .  furosemide (LASIX) 40 MG tablet, Take 1 tablet (40 mg total) by mouth 2 (two) times daily., Disp: 60 tablet, Rfl: 7 .  Goldenseal 400 MG CAPS, Take 1 capsule by mouth daily., Disp: , Rfl:  .  lactulose (CHRONULAC) 10 GM/15ML solution, TAKE 60 ML BY MOUTH 4 TIMES DAILY., Disp: 7316 mL, Rfl: 11 .  Misc Natural Products (COLON CLEANSER PO), Take 1 capsule by mouth daily., Disp: , Rfl:  .  NEXIUM 40 MG capsule, TAKE 1 CAPSULE BY MOUTH EVERY DAY BEFORE BREAKFAST, Disp: 30 capsule, Rfl: 3 .  PAZEO 0.7 % SOLN, Place 1 drop into both eyes 2 (two) times daily., Disp: , Rfl:  .  Polyethyl Glycol-Propyl Glycol (SYSTANE OP), Apply 1 drop to eye as needed (dry eyes)., Disp: , Rfl:  .  polyethylene glycol powder (MIRALAX) powder, Take 17 g by mouth 2 (two) times daily., Disp: 850 g, Rfl: 11 .  potassium chloride SA (K-DUR,KLOR-CON) 20 MEQ tablet, TAKE 2 TABLETS BY MOUTH EVERY DAY, Disp: 60 tablet, Rfl: 0 .  PROAIR HFA 108 (90 BASE) MCG/ACT inhaler, Inhale 2 puffs into the lungs every 6 (six) hours as needed for wheezing or shortness of breath (wheezing). , Disp: , Rfl: 0 .  rifaximin (XIFAXAN) 550 MG TABS tablet, Take 1 tablet (550 mg total) by mouth 2 (two) times daily., Disp: 60 tablet, Rfl: 5 .  senna (SENOKOT) 8.6 MG TABS tablet, Take 2 tablets (17.2 mg total) by mouth daily. Start with one tablet, Disp: 60 each, Rfl: 5 .  sodium phosphate (FLEET) enema, Place 1 enema rectally daily. Takes at night, Disp: , Rfl:  .  SSD 1 % cream, Apply 1 application topically daily as needed (Fever blisters.). , Disp: , Rfl: 0 .  triamcinolone cream  (KENALOG) 0.1 %, APP THIN LAYER EXT AA BID PRF ITCHING OR RASH, Disp: , Rfl: 1 .  TRUVADA 200-300 MG per tablet, TK 1 T PO  ONCE A DAY, Disp: , Rfl: 11 .  vitamin E 400 UNIT capsule, Take 400 Units by mouth daily., Disp: , Rfl:   Cc:Greig Right, MD Dr. Tommy Medal

## 2015-04-15 NOTE — Patient Instructions (Signed)
   Your physician has requested that you go to the basement for lab work before leaving today.    I appreciate the opportunity to care for you. Silvano Rusk, MD, [facility]

## 2015-04-15 NOTE — Telephone Encounter (Signed)
Patient in for office visit today and went over meds:  Potassium is to be 2 tablets daily.

## 2015-04-16 LAB — URINALYSIS, ROUTINE W REFLEX MICROSCOPIC
Bilirubin Urine: NEGATIVE
Hgb urine dipstick: NEGATIVE
Ketones, ur: NEGATIVE
Leukocytes, UA: NEGATIVE
Nitrite: NEGATIVE
RBC / HPF: NONE SEEN (ref 0–?)
SPECIFIC GRAVITY, URINE: 1.01 (ref 1.000–1.030)
Total Protein, Urine: NEGATIVE
Urine Glucose: NEGATIVE
Urobilinogen, UA: 1 (ref 0.0–1.0)
WBC, UA: NONE SEEN (ref 0–?)
pH: 7.5 (ref 5.0–8.0)

## 2015-04-17 ENCOUNTER — Telehealth: Payer: Self-pay | Admitting: Internal Medicine

## 2015-04-17 ENCOUNTER — Encounter: Payer: Self-pay | Admitting: Internal Medicine

## 2015-04-17 NOTE — Telephone Encounter (Signed)
Please review and advise on labs when you have a moment please

## 2015-04-17 NOTE — Progress Notes (Signed)
Patient ID: [name], male   DOB: September 26, 1965, 49 y.o.   MRN: [id number]  Spoke with [facility] and they told me that [facility]'s xifaxan has been approved.  Barb Merino, RN, [place] spoke with mom and she said they have the medicine.

## 2015-04-17 NOTE — Telephone Encounter (Signed)
Labs ok overall No UTI Ammonia elevated but better than in past He needs to get back on Xifaxan we are working on prior auth RTC me feb/mar

## 2015-04-17 NOTE — Telephone Encounter (Signed)
Patient's mother notified He was given samples of xifaxan on Monday and is back on it.

## 2015-04-20 ENCOUNTER — Encounter: Payer: Self-pay | Admitting: Internal Medicine

## 2015-04-21 ENCOUNTER — Other Ambulatory Visit: Payer: Self-pay | Admitting: Internal Medicine

## 2015-04-21 ENCOUNTER — Other Ambulatory Visit: Payer: Medicaid Other

## 2015-04-21 ENCOUNTER — Telehealth: Payer: Self-pay | Admitting: *Deleted

## 2015-04-21 DIAGNOSIS — B20 Human immunodeficiency virus [HIV] disease: Secondary | ICD-10-CM

## 2015-04-21 LAB — LIPID PANEL
Cholesterol: 123 mg/dL — ABNORMAL LOW (ref 125–200)
HDL: 59 mg/dL (ref >=40)
LDL CALC: 54 mg/dL (ref <130)
TRIGLYCERIDES: 52 mg/dL (ref <150)
Total CHOL/HDL Ratio: 2.1 Ratio (ref <=5.0)
VLDL: 10 mg/dL (ref <30)

## 2015-04-21 LAB — COMPLETE METABOLIC PANEL WITH GFR
ALT: 23 U/L (ref 9–46)
AST: 30 U/L (ref 10–35)
Albumin: 3.9 g/dL (ref 3.6–5.1)
Alkaline Phosphatase: 116 U/L — ABNORMAL HIGH (ref 40–115)
BILIRUBIN TOTAL: 1.6 mg/dL — AB (ref 0.2–1.2)
BUN: 11 mg/dL (ref 7–25)
CO2: 22 mmol/L (ref 20–31)
CREATININE: 0.97 mg/dL (ref 0.70–1.33)
Calcium: 9 mg/dL (ref 8.6–10.3)
Chloride: 104 mmol/L (ref 98–110)
GFR, Est African American: >89 mL/min (ref >=60)
GLUCOSE: 143 mg/dL — AB (ref 65–99)
Potassium: 3.6 mmol/L (ref 3.5–5.3)
SODIUM: 140 mmol/L (ref 135–146)
TOTAL PROTEIN: 6.5 g/dL (ref 6.1–8.1)

## 2015-04-21 NOTE — Telephone Encounter (Signed)
Please advise [name], thank you. 

## 2015-04-21 NOTE — Telephone Encounter (Signed)
Patient in lab today and ask that once his labs are resulted if we could fax them to Dr Luetta Nutting office. The surgeon who is going to do his gastric bypass. Advised the patient we can send them once they are available.   Dr Luetta Nutting fax [phone] phone [phone]

## 2015-04-22 ENCOUNTER — Telehealth: Payer: Self-pay | Admitting: *Deleted

## 2015-04-22 LAB — CBC WITH DIFFERENTIAL/PLATELET
BASOS ABS: 0 10*3/uL (ref 0.0–0.1)
Basophils Relative: 0 % (ref 0–1)
Eosinophils Absolute: 0.1 10*3/uL (ref 0.0–0.7)
Eosinophils Relative: 4 % (ref 0–5)
HEMATOCRIT: 36.1 % — AB (ref 39.0–52.0)
Hemoglobin: 11.8 g/dL — ABNORMAL LOW (ref 13.0–17.0)
LYMPHS ABS: 0.9 10*3/uL (ref 0.7–4.0)
LYMPHS PCT: 32 % (ref 12–46)
MCH: 26.4 pg (ref 26.0–34.0)
MCHC: 32.7 g/dL (ref 30.0–36.0)
MCV: 80.8 fL (ref 78.0–100.0)
MPV: 10.8 fL (ref 8.6–12.4)
Monocytes Absolute: 0.3 10*3/uL (ref 0.1–1.0)
Monocytes Relative: 10 % (ref 3–12)
NEUTROS PCT: 54 % (ref 43–77)
Neutro Abs: 1.5 10*3/uL — ABNORMAL LOW (ref 1.7–7.7)
Platelets: 83 10*3/uL — ABNORMAL LOW (ref 150–400)
RBC: 4.47 MIL/uL (ref 4.22–5.81)
RDW: 17.6 % — ABNORMAL HIGH (ref 11.5–15.5)
WBC: 2.8 10*3/uL — ABNORMAL LOW (ref 4.0–10.5)

## 2015-04-22 LAB — T-HELPER CELL (CD4) - (RCID CLINIC ONLY)
CD4 % Helper T Cell: 25 % — ABNORMAL LOW (ref 33–55)
CD4 T Cell Abs: 210 /uL — ABNORMAL LOW (ref 400–2700)

## 2015-04-22 LAB — HIV-1 RNA QUANT-NO REFLEX-BLD
HIV 1 RNA QUANT: 27 {copies}/mL — AB (ref <20)
HIV-1 RNA Quant, Log: 1.43 Log copies/mL — ABNORMAL HIGH (ref <1.30)

## 2015-04-22 LAB — RPR

## 2015-04-22 NOTE — Telephone Encounter (Signed)
RN received referral from Dr Tommy Medal, after reviewing the chart I contacted the patient. Pt's mother answered the telephone and I politely asked to speak with [name], pt mother stated " I am the one that you need to be talking to" I verbalized understanding with [name] and made her aware of the reason for my call and that I would love to assist the patient with his medications or help in anyway I can. Pt's mother stated ' I am giving him his medications everyday just like Dr Tommy Medal and Dr Carlean Purl told me to. Pt's mother then asked "Does Dr Tommy Medal know that you are calling me? " I reassured the pt's mother that Dr Tommy Medal is aware that I am calling and wanted to be sure that he offers [name] as much assistance as possible. Pt's mother thanked me for my call and stated that she will begin to accompany [name] into the exam room because there are things that she would like to discuss. RN stated "Yes, Ma'am I understand" and we politely ended the call at this time.

## 2015-04-22 NOTE — Telephone Encounter (Signed)
Ok to refill x 1 when due

## 2015-04-25 ENCOUNTER — Telehealth: Payer: Self-pay

## 2015-04-25 NOTE — Telephone Encounter (Signed)
Left detailed message to call back and set up appointment with Dr. Carlean Purl for follow up.

## 2015-04-25 NOTE — Telephone Encounter (Signed)
-----   Message from Gatha Mayer, MD sent at 04/20/2015  1:54 PM EST ----- Regarding: appt Let mom know we need to see him late Jan or Feb

## 2015-04-29 ENCOUNTER — Other Ambulatory Visit: Payer: Self-pay | Admitting: Gastroenterology

## 2015-05-02 ENCOUNTER — Other Ambulatory Visit: Payer: Self-pay | Admitting: Internal Medicine

## 2015-05-05 ENCOUNTER — Ambulatory Visit (INDEPENDENT_AMBULATORY_CARE_PROVIDER_SITE_OTHER): Payer: Medicaid Other | Admitting: Infectious Disease

## 2015-05-05 ENCOUNTER — Encounter: Payer: Self-pay | Admitting: Infectious Disease

## 2015-05-05 VITALS — BP 144/86 | HR 78 | Temp 98.3°F | Wt 301.0 lb

## 2015-05-05 DIAGNOSIS — K7290 Hepatic failure, unspecified without coma: Secondary | ICD-10-CM | POA: Diagnosis not present

## 2015-05-05 DIAGNOSIS — G9009 Other idiopathic peripheral autonomic neuropathy: Secondary | ICD-10-CM | POA: Diagnosis not present

## 2015-05-05 DIAGNOSIS — K7030 Alcoholic cirrhosis of liver without ascites: Secondary | ICD-10-CM

## 2015-05-05 DIAGNOSIS — B20 Human immunodeficiency virus [HIV] disease: Secondary | ICD-10-CM

## 2015-05-05 DIAGNOSIS — W19XXXD Unspecified fall, subsequent encounter: Secondary | ICD-10-CM | POA: Diagnosis not present

## 2015-05-05 DIAGNOSIS — K7682 Hepatic encephalopathy: Secondary | ICD-10-CM

## 2015-05-05 NOTE — Progress Notes (Signed)
Subjective:    Patient ID: [name], male    DOB: 11-22-1965, 49 y.o.   MRN: [id number]  HPI   [name] is a 49 y.o. male with HIV infection, cirrhosis,  who had been  doing superbly well on his antiviral regimen ofTivicay and Truvada  And now [place] and Descovy and his VL remains suppressed and CD4 is above 200.  Lab Results  Component Value Date   HIV1RNAQUANT 27* 04/21/2015   Lab Results  Component Value Date   CD4TABS 210* 04/21/2015   CD4TABS 240* 12/23/2014   CD4TABS 250* 10/09/2014     He has been  evaluated at [facility] for possible transplant but tells me they have told him that he is still 10 years out for need for transplant.  He continues to have problems with falling esp when getting onto and off of exercise bike at the gym.   He claims to be compliant with his lactulose and rifaxmin  I had attempted to have an RN visit him in home and assess home for safety but she was rejected by Mother who was here today. I again asked if Ambre could visit and this time they appeared willing to let her visit to assess for home health safety. I would also like to refer to PT. He is not seeing PCP regularly.  Past Medical History  Diagnosis Date  . HIV DISEASE 06/23/2006  . HYPERLIPIDEMIA 06/23/2006  . ANXIETY 06/23/2006  . DEPRESSION 06/23/2006  . HYPERTENSION 06/23/2006  . 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 Ascites 11/13/2009  . STRAIN, CHEST WALL 03/15/2007  . 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 Gastric ulcer 06/2010  . Alcoholic cirrhosis of liver ([place])   . Alcoholism, chronic ([place])   . Iron deficiency anemia   . GERD (gastroesophageal reflux disease)   . SBP (spontaneous bacterial peritonitis) ([place]) 05/03/2011    Suspected by high leukocytes on paracentesis. Clinical scenario also compatible. November 2012 responded to Levaquin. Started on trimethoprim-sulfamethoxazole double  strength daily for prophylaxis.   . Obesity (BMI 30-39.9)     BMI 34 kg/m^2  . Left foot infection   . Ascites   . Portal hypertensive gastropathy 01/02/2013  . Blood dyscrasia     HIV  . Skin cancer     left calf  . 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 Avulsion fracture of middle phalanges of  3rd/4th fingers left hand 01/07/2014    Past Surgical History  Procedure Laterality Date  . Esophagogastroduodenoscopy w/ banding  06/26/2010    variceal ligation  . Carpal tunnel release      left  . Esophagogastroduodenoscopy  07/01/2010;  08/12/10    small varices, gastric ulcer  . Esophagogastroduodenoscopy  10/26/2011    Procedure: ESOPHAGOGASTRODUODENOSCOPY (EGD);  Surgeon: Gatha Mayer, MD;  Location: [facility];  Service: Endoscopy;  Laterality: N/A;  . Upper gastrointestinal endoscopy    . Colonoscopy  march 2013  . Esophagogastroduodenoscopy N/A 01/02/2013    Procedure: ESOPHAGOGASTRODUODENOSCOPY (EGD);  Surgeon: Gatha Mayer, MD;  Location: [facility];  Service: Endoscopy;  Laterality: N/A;  . Gastric varices banding N/A 01/02/2013    Procedure: GASTRIC VARICES BANDING;  Surgeon: Gatha Mayer, MD;  Location: [facility];  Service: Endoscopy;  Laterality: N/A;  possible banding  . Esophagogastroduodenoscopy N/A 04/23/2013    Procedure: ESOPHAGOGASTRODUODENOSCOPY (EGD);  Surgeon: Gatha Mayer, MD;  Location: [facility];  Service: Endoscopy;  Laterality: N/A;  . Esophagogastroduodenoscopy N/A 05/01/2013    Procedure: ESOPHAGOGASTRODUODENOSCOPY (EGD);  Surgeon: Gatha Mayer, MD;  Location: [facility];  Service: Endoscopy;  Laterality: N/A;  follow-up varices and possibly band them  . Esophageal banding  05/01/2013    Procedure: ESOPHAGEAL BANDING;  Surgeon: Gatha Mayer, MD;  Location: [facility];  Service: Endoscopy;;  . Esophagogastroduodenoscopy N/A 09/04/2013    Procedure: ESOPHAGOGASTRODUODENOSCOPY (EGD);  Surgeon: Gatha Mayer, MD;   Location: [facility];  Service: Endoscopy;  Laterality: N/A;  . Esophagogastroduodenoscopy N/A 09/10/2014    Procedure: ESOPHAGOGASTRODUODENOSCOPY (EGD);  Surgeon: Gatha Mayer, MD;  Location: [facility];  Service: Endoscopy;  Laterality: N/A;    Family History  Problem Relation Age of Onset  . Hyperlipidemia Mother   . Hypertension Mother   . Breast cancer Mother     questionable  . Heart disease Mother   . Hypertension Father   . Irritable bowel syndrome Father   . Drug abuse Brother   . Heart disease Brother   . Breast cancer Maternal Aunt     maternal great aunt  . Colon cancer Neg Hx   . Alcohol abuse Other   . Heart disease Maternal Uncle   . Irritable bowel syndrome Paternal Aunt       Social History   Social History  . Marital Status: Single    Spouse Name: N/A  . Number of Children: N/A  . Years of Education: N/A   Occupational History  . disability    Social History Main Topics  . Smoking status: Former Smoker -- 0.10 packs/day for 10 years    Types: Cigarettes    Start date: 03/03/2014    Quit date: 04/03/2014  . Smokeless tobacco: Never Used     Comment: form given 05-19-11  . Alcohol Use: No     Comment: pt is an alcoholic currently in [employer]  . Drug Use: No  . Sexual Activity:    Partners: Male    Birth Control/ Protection: Condom     Comment: pt. declined condoms   Other Topics Concern  . None   Social History Narrative   Single, disabled hair stylist   Lives with parents in a home with a basement.  Parents do not like for him to go down stairs because they are steep.               Allergies  Allergen Reactions  . Penicillins     REACTION: hives     Current outpatient prescriptions:  .  acyclovir ointment (ZOVIRAX) 5 %, APPLY SUFFICIENT AMOUNT TO AA 5 TIMES A DAY FOR 5 DAYS, Disp: , Rfl: 1 .  ALPRAZolam (XANAX) 1 MG tablet, TAKE 1 TABLET BY MOUTH TWO TIMES A DAY, Disp: 60 tablet, Rfl: 0 .  AMBULATORY NON  FORMULARY MEDICATION, Lift Chair- Use as needed Dx: generalized weakness, hepatic encephalopathy, Disp: 1 each, Rfl: 0 .  aMILoride (MIDAMOR) 5 MG tablet, Take 3 tablets (15 mg total) by mouth daily., Disp: 90 tablet, Rfl: 5 .  amitriptyline (ELAVIL) 100 MG tablet, TAKE 1 TABLET BY MOUTH AT BEDTIME, Disp: 30 tablet, Rfl: 11 .  Docusate Calcium (STOOL SOFTENER PO), Take 3 tablets by mouth at bedtime., Disp: , Rfl:  .  dolutegravir (TIVICAY) 50 MG tablet, Take 1 tablet (50 mg total) by mouth daily., Disp: 30 tablet, Rfl: 11 .  emtricitabine-tenofovir AF (DESCOVY) 200-25 MG per tablet, Take 1 tablet by mouth daily., Disp: 30 tablet, Rfl:  11 .  esomeprazole (NEXIUM) 40 MG capsule, Take 1 capsule (40 mg total) by mouth daily before breakfast., Disp: 30 capsule, Rfl: 0 .  furosemide (LASIX) 40 MG tablet, Take 1 tablet (40 mg total) by mouth 2 (two) times daily., Disp: 60 tablet, Rfl: 7 .  Goldenseal 400 MG CAPS, Take 1 capsule by mouth daily., Disp: , Rfl:  .  lactulose (CHRONULAC) 10 GM/15ML solution, TAKE 60 ML BY MOUTH 4 TIMES DAILY., Disp: 7316 mL, Rfl: 11 .  Misc Natural Products (COLON CLEANSER PO), Take 1 capsule by mouth daily., Disp: , Rfl:  .  NEXIUM 40 MG capsule, TAKE 1 CAPSULE BY MOUTH EVERY DAY BEFORE BREAKFAST, Disp: 30 capsule, Rfl: 3 .  PAZEO 0.7 % SOLN, Place 1 drop into both eyes 2 (two) times daily., Disp: , Rfl:  .  Polyethyl Glycol-Propyl Glycol (SYSTANE OP), Apply 1 drop to eye as needed (dry eyes)., Disp: , Rfl:  .  polyethylene glycol powder (MIRALAX) powder, Take 17 g by mouth 2 (two) times daily., Disp: 850 g, Rfl: 11 .  potassium chloride SA (K-DUR,KLOR-CON) 20 MEQ tablet, TAKE 2 TABLETS BY MOUTH EVERY DAY, Disp: 60 tablet, Rfl: 0 .  PROAIR HFA 108 (90 BASE) MCG/ACT inhaler, Inhale 2 puffs into the lungs every 6 (six) hours as needed for wheezing or shortness of breath (wheezing). , Disp: , Rfl: 0 .  rifaximin (XIFAXAN) 550 MG TABS tablet, Take 1 tablet (550 mg total) by mouth  2 (two) times daily., Disp: 60 tablet, Rfl: 5 .  senna (SENOKOT) 8.6 MG TABS tablet, Take 2 tablets (17.2 mg total) by mouth daily. Start with one tablet, Disp: 60 each, Rfl: 5 .  sodium phosphate (FLEET) enema, Place 1 enema rectally daily. Takes at night, Disp: , Rfl:  .  SSD 1 % cream, Apply 1 application topically daily as needed (Fever blisters.). , Disp: , Rfl: 0 .  triamcinolone cream (KENALOG) 0.1 %, APP THIN LAYER EXT AA BID PRF ITCHING OR RASH, Disp: , Rfl: 1 .  TRUVADA 200-300 MG per tablet, TK 1 T PO  ONCE A DAY, Disp: , Rfl: 11 .  vitamin E 400 UNIT capsule, Take 400 Units by mouth daily., Disp: , Rfl:    Review of Systems  Constitutional: Negative for fever, chills, diaphoresis and unexpected weight change.  HENT: Negative for congestion, rhinorrhea, sinus pressure, sneezing, sore throat and trouble swallowing.   Eyes: Negative for photophobia and visual disturbance.  Respiratory: Negative for cough, chest tightness, shortness of breath, wheezing and stridor.   Cardiovascular: Negative for chest pain, palpitations and leg swelling.  Gastrointestinal: Negative for nausea, vomiting, abdominal pain, diarrhea, constipation, blood in stool, abdominal distention and anal bleeding.  Genitourinary: Negative for dysuria, hematuria, flank pain and difficulty urinating.  Musculoskeletal: Positive for gait problem. Negative for myalgias, back pain, joint swelling and arthralgias.  Skin: Negative for color change, pallor, rash and wound.  Neurological: Positive for weakness. Negative for dizziness, tremors, seizures and light-headedness.  Hematological: Negative for adenopathy. Does not bruise/bleed easily.  Psychiatric/Behavioral: Negative for behavioral problems, confusion, sleep disturbance, dysphoric mood, decreased concentration and agitation. The patient is not nervous/anxious.        Objective:   Physical Exam  Constitutional: He is oriented to person, place, and time. He appears  well-developed and well-nourished. No distress.  HENT:  Head: Normocephalic and atraumatic.  Mouth/Throat: Oropharynx is clear and moist. No oropharyngeal exudate.  Eyes: Conjunctivae and EOM are normal. Pupils are equal, round,  and reactive to light. No scleral icterus.  Neck: Normal range of motion. Neck supple. No JVD present.  Cardiovascular: Normal rate, regular rhythm and normal heart sounds.  Exam reveals no gallop and no friction rub.   No murmur heard. Pulmonary/Chest: Effort normal and breath sounds normal. No respiratory distress. He has no wheezes. He has no rales. He exhibits no tenderness.  Abdominal: Soft. He exhibits distension. He exhibits no mass. There is no tenderness. There is no rebound and no guarding.  Musculoskeletal: He exhibits no edema or tenderness.  Lymphadenopathy:    He has no cervical adenopathy.  Neurological: He is alert and oriented to person, place, and time. No sensory deficit. He exhibits normal muscle tone. Gait normal.  Skin: Skin is warm and dry. He is not diaphoretic. No erythema. No pallor.  Psychiatric: He has a normal mood and affect. His behavior is normal. Thought content normal. His speech is delayed.   He has slight tremor of hands bilaterally but no frank "liver flap"       Assessment & Plan:   HIV: continue  TIVICAY and DESCOVy for better bone and kidney health.   Etoh induced cirrhosis with varices: followed by [facility] and [facility] team. Need to make sure he continues to take his lactulose and rifaximin etc.  Frequent falls: may be combination of his neuropathy and his liver disease with encephalopathy, sedating meds. I am not going to add any further sedatives including gabapentin or lyrica.   I am asking for Ambre to visit him again and to have him seen by PT  I spent greater than 40 minutes with the patient including greater than 50% of time in face to face counsel of the patient re his HIV, Etoh induced cirrhosis, frequent  falls, neuropathy and in coordination of his care.

## 2015-05-14 ENCOUNTER — Other Ambulatory Visit: Payer: Self-pay | Admitting: Internal Medicine

## 2015-05-14 ENCOUNTER — Telehealth: Payer: Self-pay | Admitting: Internal Medicine

## 2015-05-15 ENCOUNTER — Telehealth: Payer: Self-pay

## 2015-05-15 NOTE — Telephone Encounter (Signed)
Enid Derry (mom) left message for me to call her.  I called her back and had to leave message for her to call me back.

## 2015-05-15 NOTE — Telephone Encounter (Signed)
Left message on voice mail that xifaxin refill sent in yesterday when we received the request from the pharmacy.

## 2015-05-19 ENCOUNTER — Telehealth: Payer: Self-pay | Admitting: Internal Medicine

## 2015-05-19 ENCOUNTER — Telehealth: Payer: Self-pay | Admitting: *Deleted

## 2015-05-19 ENCOUNTER — Other Ambulatory Visit: Payer: Self-pay | Admitting: Internal Medicine

## 2015-05-19 NOTE — Telephone Encounter (Signed)
Patient mother called to advise they have decided to go through with the referral for physical therapy. She asked if the patient can be scheduled in [place] as it is closer to home for them. Advised will try and get is scheduled with a facility there if I can. Sent referral to [facility] [phone] fax [phone], [address], [phone]. Advised her will call once an appt is made.

## 2015-05-19 NOTE — Telephone Encounter (Signed)
Xanax rx faxed to pharmacy, ok'ed per Dr Carlean Purl.

## 2015-05-19 NOTE — Telephone Encounter (Signed)
Patient's mom requesting refill on his xanax, due tomorrow.

## 2015-05-19 NOTE — Telephone Encounter (Signed)
May I refill [name]? 

## 2015-05-19 NOTE — Telephone Encounter (Signed)
Ok x 2  

## 2015-06-02 ENCOUNTER — Telehealth: Payer: Self-pay | Admitting: Internal Medicine

## 2015-06-02 MED ORDER — FUROSEMIDE 40 MG PO TABS: 60.0000 mg | ORAL_TABLET | Freq: Every day | ORAL | Status: DC

## 2015-06-02 NOTE — Telephone Encounter (Signed)
Pts mother aware and med list adjusted.

## 2015-06-02 NOTE — Telephone Encounter (Signed)
Pts mother states pt is having issues with urinary incontinence especially at night. Discussed with mother that this is not Dr. Celesta Aver specialty area but mother wants to know if perhaps Dr. Carlean Purl could decrease pts fluid pills. Discussed with her that those meds are to help with fluid accumulation in the belly. Discussed with her that he could perhaps try a urinal along with depends at night but she states he can't use his hands that well and it is hard for him to get up. Mother wanted to see if Dr. Carlean Purl had any suggestions. Please advise.

## 2015-06-02 NOTE — Telephone Encounter (Signed)
They can try taking 60 mg furosemide in the morning and skipping the second dose, this is a change from 40 mg twice a day please update the medication list to reflect this

## 2015-06-03 ENCOUNTER — Other Ambulatory Visit: Payer: Self-pay | Admitting: Internal Medicine

## 2015-06-03 NOTE — Telephone Encounter (Signed)
Ok on both [name]?

## 2015-06-03 NOTE — Telephone Encounter (Signed)
Refill x4 

## 2015-06-14 ENCOUNTER — Other Ambulatory Visit: Payer: Self-pay | Admitting: Internal Medicine

## 2015-06-17 ENCOUNTER — Telehealth: Payer: Self-pay | Admitting: Internal Medicine

## 2015-06-17 NOTE — Telephone Encounter (Signed)
Mom reports worsening confusion.  He is currently taking lactulose 60 ml qid.  Sometimes mom admits she forgets to give him the 4th dose.  Mom reports is taking xifaxan BID.  Mom says more confused.  Please  advise

## 2015-06-17 NOTE — Telephone Encounter (Signed)
We can try to see him in the office this week but if its severe an ED evaluation is ok Needs to take 4th dose

## 2015-06-17 NOTE — Telephone Encounter (Signed)
Mom notified  Scheduled for follow up 06/19/15 11:15

## 2015-06-18 ENCOUNTER — Telehealth: Payer: Self-pay

## 2015-06-18 NOTE — Telephone Encounter (Signed)
Have sent prior authorization for xifaxan to [facility] to have them assist [place] in getting this completed.  [facility] fax# [phone]. Phone # [phone].

## 2015-06-19 ENCOUNTER — Other Ambulatory Visit (INDEPENDENT_AMBULATORY_CARE_PROVIDER_SITE_OTHER): Payer: Medicaid Other

## 2015-06-19 ENCOUNTER — Encounter: Payer: Self-pay | Admitting: Internal Medicine

## 2015-06-19 ENCOUNTER — Ambulatory Visit (INDEPENDENT_AMBULATORY_CARE_PROVIDER_SITE_OTHER): Payer: Medicaid Other | Admitting: Internal Medicine

## 2015-06-19 VITALS — BP 98/70 | HR 83 | Ht 70.75 in | Wt 309.0 lb

## 2015-06-19 DIAGNOSIS — K7682 Hepatic encephalopathy: Secondary | ICD-10-CM

## 2015-06-19 DIAGNOSIS — K7031 Alcoholic cirrhosis of liver with ascites: Secondary | ICD-10-CM

## 2015-06-19 DIAGNOSIS — R609 Edema, unspecified: Secondary | ICD-10-CM

## 2015-06-19 DIAGNOSIS — I8511 Secondary esophageal varices with bleeding: Secondary | ICD-10-CM

## 2015-06-19 DIAGNOSIS — F411 Generalized anxiety disorder: Secondary | ICD-10-CM | POA: Diagnosis not present

## 2015-06-19 DIAGNOSIS — K7290 Hepatic failure, unspecified without coma: Secondary | ICD-10-CM | POA: Diagnosis not present

## 2015-06-19 LAB — COMPREHENSIVE METABOLIC PANEL
ALT: 19 U/L (ref 0–53)
AST: 23 U/L (ref 0–37)
Albumin: 3.9 g/dL (ref 3.5–5.2)
Alkaline Phosphatase: 93 U/L (ref 39–117)
BILIRUBIN TOTAL: 1.1 mg/dL (ref 0.2–1.2)
BUN: 10 mg/dL (ref 6–23)
CO2: 25 meq/L (ref 19–32)
CREATININE: 0.88 mg/dL (ref 0.40–1.50)
Calcium: 9.9 mg/dL (ref 8.4–10.5)
Chloride: 107 mEq/L (ref 96–112)
GFR: 97.19 mL/min (ref >60.00)
Glucose, Bld: 93 mg/dL (ref 70–99)
Potassium: 4.3 mEq/L (ref 3.5–5.1)
Sodium: 140 mEq/L (ref 135–145)
Total Protein: 6.6 g/dL (ref 6.0–8.3)

## 2015-06-19 LAB — CBC WITH DIFFERENTIAL/PLATELET
BASOS PCT: 0.3 % (ref 0.0–3.0)
Basophils Absolute: 0 10*3/uL (ref 0.0–0.1)
EOS PCT: 2.4 % (ref 0.0–5.0)
Eosinophils Absolute: 0.1 10*3/uL (ref 0.0–0.7)
HCT: 37.3 % — ABNORMAL LOW (ref 39.0–52.0)
Hemoglobin: 11.9 g/dL — ABNORMAL LOW (ref 13.0–17.0)
LYMPHS ABS: 1 10*3/uL (ref 0.7–4.0)
Lymphocytes Relative: 26.8 % (ref 12.0–46.0)
MCHC: 31.8 g/dL (ref 30.0–36.0)
MCV: 83.4 fl (ref 78.0–100.0)
MONOS PCT: 9.8 % (ref 3.0–12.0)
Monocytes Absolute: 0.4 10*3/uL (ref 0.1–1.0)
NEUTROS ABS: 2.3 10*3/uL (ref 1.4–7.7)
NEUTROS PCT: 60.7 % (ref 43.0–77.0)
Platelets: 91 10*3/uL — ABNORMAL LOW (ref 150.0–400.0)
RBC: 4.48 Mil/uL (ref 4.22–5.81)
RDW: 18.8 % — ABNORMAL HIGH (ref 11.5–15.5)
WBC: 3.7 10*3/uL — ABNORMAL LOW (ref 4.0–10.5)

## 2015-06-19 LAB — AMMONIA: Ammonia: 87 umol/L — ABNORMAL HIGH (ref 11–35)

## 2015-06-19 NOTE — Assessment & Plan Note (Addendum)
1+ on right w/ erythema and 2 linear ulcers/lacerations - healing - no signs of  Infection Trace on left his weight is up as well so I think he may need higher dose, if his creatinine allows it will use 80 mg daily instead of 60.

## 2015-06-19 NOTE — Assessment & Plan Note (Addendum)
We will continue lactulose and Xifaxan. I explained how there can be some fluctuations in this over time but it is right to be concerned when he has more confusion and lethargy. I'll seen the obvious signs of infection or triggers for encephalopathy today.

## 2015-06-19 NOTE — Assessment & Plan Note (Addendum)
Anticipate a follow-up EGD this spring for history of bleeding varices

## 2015-06-19 NOTE — Assessment & Plan Note (Signed)
He continues to use low-dose alprazolam. He's been on this forever basically and I don't think despite the possible problems with that it's practical to withdraw this at this time.

## 2015-06-19 NOTE — Progress Notes (Signed)
Subjective:    Patient ID: [name], male    DOB: April 06, 1966, 50 y.o.   MRN: [id number] Cc: f/u cirrhosis and hepatic encephalopathy HPI Mom called this week - increasing confusion, she is here with him today and reports that things are improved. Apparently he was sleepy a lot during the holidays. Roye attributes that to eating more meat. Another phone call earlier had been about his diuretics and I adjusted those and reduced the overall dose of furosemide but he is taking 60 mg daily instead of 40 mg twice a day. That is helping with nocturnal urination. He was getting the urge to urinate when he was in bed and given his slow movement status it was hard to make it and he was having some incontinence. He is also wearing depends at night now for insurance.  Mom reports he still has to have an enema most nights to help with defecation though he is generally moving his bowels most days with 3-4 doses of lactulose a day. Xifaxan is in for prior authorization though they have some samples they are working with. Eyes any bleeding or abdominal pain. Still works out at [employer] some though is been on a holiday from that. Interested in having a deep tissue massage. Has had a few falls but in general is better, is waiting on having physical therapy, I referral was placed by infectious disease.  Wt Readings from Last 3 Encounters:  06/19/15 309 lb (140.161 kg)  05/05/15 301 lb (136.533 kg)  04/15/15 302 lb (136.986 kg)   [place] thought his weight was less down a few pounds may be compared was home scales.  Medications, allergies, past medical history, past surgical history, family history and social history are reviewed and updated in the EMR.  Review of Systems As above all other review of systems negative    Objective:   Physical Exam @BP  98/70 mmHg  Pulse 83  Ht 5' 10.75" (1.797 m)  Wt 309 lb (140.161 kg)  BMI 43.40 kg/m2@  General:  Obese, chronically ill and in no acute  distress Eyes:  anicteric. Lungs: Clear to auscultation bilaterally. Heart:  S1S2, no rubs, murmurs, gallops. Abdomen:  Obese soft, non-tender.  Extremities:   Right greater than left lower extremity edema, there is some mild erythema and 2 linear healing lacerations in the right pretibial area above the ankle. Right lower extremity is chronically more edematous than the left. I do not detect any signs of cellulitis.  Neuro:  A&O x 3. , Speech is clear, no asterixis today Psych:  appropriate mood and  Affect.   Data Reviewed:  Last CBC, CMET in Nov - Hgb11.8, PLT 83, WBC 2.8 NL CMET except glucose 143, t bili 1.6, alk phos 116 Lab Results  Component Value Date   INR 1.2* 04/15/2015   INR 1.2* 10/21/2014   INR 1.32 10/09/2014      Assessment & Plan:  Alcoholic cirrhosis of liver Recheck labs today including comprehensive metabolic panel, CBC and ammonia. He seems stable at this point. I was concerned about what I had heard over the phone. I did discuss his living situation with Jorell and his mom today and they both feel like it is going okay at home. She does not think there is any problem and continuing to help care for him at home presently. I plan to see him back in 3 months, it may be coordinated with his annual endoscopy to recheck his varices.  Encephalopathy, hepatic  We will continue lactulose and Xifaxan. I explained how there can be some fluctuations in this over time but it is right to be concerned when he has more confusion and lethargy. I'll seen the obvious signs of infection or triggers for encephalopathy today.  Peripheral edema 1+ on right w/ erythema and 2 linear ulcers/lacerations - healing - no signs of  Infection Trace on left his weight is up as well so I think he may need higher dose, if his creatinine allows it will use 80 mg daily instead of 60.  Anxiety state He continues to use low-dose alprazolam. He's been on this forever basically and I don't think  despite the possible problems with that it's practical to withdraw this at this time.   Esophageal varices Anticipate a follow-up EGD this spring for history of bleeding varices    As I look at his chart today, I wonder why he has not applied for disability, he is not able to hold a job for work so he would be an appropriate candidate for that.  CC: Greig Right, MD Dr[facility]

## 2015-06-19 NOTE — Patient Instructions (Signed)
   Your physician has requested that you go to the basement for the following lab work before leaving today: CBC/diff, CMET, Ammonia    I appreciate the opportunity to care for you. Silvano Rusk, MD, [facility]

## 2015-06-19 NOTE — Telephone Encounter (Signed)
[facility] called and they are working on the prior authorization for the [place].  [name] was seen in the office today and they said they have a few samples at home and we gave her a few more today to tide him over.

## 2015-06-19 NOTE — Assessment & Plan Note (Addendum)
Recheck labs today including comprehensive metabolic panel, CBC and ammonia. He seems stable at this point. I was concerned about what I had heard over the phone. I did discuss his living situation with Adithya and his mom today and they both feel like it is going okay at home. She does not think there is any problem and continuing to help care for him at home presently. I plan to see him back in 3 months, it may be coordinated with his annual endoscopy to recheck his varices.

## 2015-06-20 NOTE — Progress Notes (Signed)
Quick Note:  Labs ok/stable abnormal Change furosemide to 80 mg AM (increase from 60) ______

## 2015-06-23 ENCOUNTER — Other Ambulatory Visit: Payer: Self-pay

## 2015-06-23 MED ORDER — FUROSEMIDE 40 MG PO TABS: 80.0000 mg | ORAL_TABLET | Freq: Every day | ORAL | Status: DC

## 2015-06-24 NOTE — Telephone Encounter (Signed)
[facility] called and they got the xifaxan approved thru 06/2016.  They will contact the [facility] in [place] to get it filled for [facility].

## 2015-06-27 ENCOUNTER — Telehealth: Payer: Self-pay | Admitting: Internal Medicine

## 2015-06-27 NOTE — Telephone Encounter (Signed)
Spoke with [name] and got the rx issue straightened out.

## 2015-07-01 ENCOUNTER — Encounter (HOSPITAL_COMMUNITY): Payer: Self-pay | Admitting: Emergency Medicine

## 2015-07-01 ENCOUNTER — Emergency Department (HOSPITAL_COMMUNITY)
Admission: EM | Admit: 2015-07-01 | Discharge: 2015-07-01 | Discharge status: Against Medical Advice | Payer: Medicaid Other | Attending: Emergency Medicine | Admitting: Emergency Medicine

## 2015-07-01 ENCOUNTER — Telehealth: Payer: Self-pay | Admitting: Internal Medicine

## 2015-07-01 DIAGNOSIS — R21 Rash and other nonspecific skin eruption: Secondary | ICD-10-CM | POA: Diagnosis not present

## 2015-07-01 DIAGNOSIS — I10 Essential (primary) hypertension: Secondary | ICD-10-CM | POA: Insufficient documentation

## 2015-07-01 DIAGNOSIS — E669 Obesity, unspecified: Secondary | ICD-10-CM | POA: Insufficient documentation

## 2015-07-01 DIAGNOSIS — B20 Human immunodeficiency virus [HIV] disease: Secondary | ICD-10-CM | POA: Insufficient documentation

## 2015-07-01 DIAGNOSIS — R2243 Localized swelling, mass and lump, lower limb, bilateral: Secondary | ICD-10-CM | POA: Diagnosis not present

## 2015-07-01 LAB — BASIC METABOLIC PANEL
ANION GAP: 12 (ref 5–15)
BUN: 8 mg/dL (ref 6–20)
CHLORIDE: 107 mmol/L (ref 101–111)
CO2: 21 mmol/L — ABNORMAL LOW (ref 22–32)
Calcium: 8.9 mg/dL (ref 8.9–10.3)
Creatinine, Ser: 0.78 mg/dL (ref 0.61–1.24)
GFR calc Af Amer: >60 mL/min (ref >60)
GFR calc non Af Amer: >60 mL/min (ref >60)
Glucose, Bld: 105 mg/dL — ABNORMAL HIGH (ref 65–99)
POTASSIUM: 3.5 mmol/L (ref 3.5–5.1)
Sodium: 140 mmol/L (ref 135–145)

## 2015-07-01 LAB — CBC
HEMATOCRIT: 36.2 % — AB (ref 39.0–52.0)
Hemoglobin: 11.8 g/dL — ABNORMAL LOW (ref 13.0–17.0)
MCH: 26.9 pg (ref 26.0–34.0)
MCHC: 32.6 g/dL (ref 30.0–36.0)
MCV: 82.5 fL (ref 78.0–100.0)
Platelets: 70 10*3/uL — ABNORMAL LOW (ref 150–400)
RBC: 4.39 MIL/uL (ref 4.22–5.81)
RDW: 17 % — ABNORMAL HIGH (ref 11.5–15.5)
WBC: 3.6 10*3/uL — AB (ref 4.0–10.5)

## 2015-07-01 NOTE — Telephone Encounter (Signed)
Patient's mother reports that patients pedal edema is worse.  Right foot/leg is now so large that he can't wear his shoes.  Left leg is larger than is was at the last office visit.  His primary care had advised that he wear support hose.  She reports that this has made the ulcer on his right leg worse and they have not been able to wear them.  His current furosemide dose is 80 mg she is giving it to him as 40 mg bid.  Please advise

## 2015-07-01 NOTE — Telephone Encounter (Signed)
I think he needs an ED evaluation and possible admission - suspect will need IV diuretics

## 2015-07-01 NOTE — Telephone Encounter (Signed)
Mother notified She will take him to [facility]

## 2015-07-01 NOTE — ED Notes (Signed)
Not in Lobby 

## 2015-07-01 NOTE — ED Notes (Signed)
Informed by staff that patient has left.

## 2015-07-01 NOTE — ED Notes (Signed)
Pt reports bilateral foot/ankle swelling for the past week. Saw PCP for this and was told to wear supportive hose. Tried hose for 3 days, but it did not seem to improve swelling. Pt also noticed a rash on his R ankle after wearing hose. No hx CHF or SOB.

## 2015-07-02 ENCOUNTER — Telehealth: Payer: Self-pay | Admitting: Internal Medicine

## 2015-07-02 ENCOUNTER — Emergency Department (HOSPITAL_COMMUNITY)
Admission: EM | Admit: 2015-07-02 | Discharge: 2015-07-02 | Disposition: A | Discharge status: Home / Self Care | Payer: Medicaid Other | Attending: Emergency Medicine | Admitting: Emergency Medicine

## 2015-07-02 ENCOUNTER — Emergency Department (EMERGENCY_DEPARTMENT_HOSPITAL): Payer: Medicaid Other

## 2015-07-02 ENCOUNTER — Encounter (HOSPITAL_COMMUNITY): Payer: Self-pay | Admitting: Emergency Medicine

## 2015-07-02 DIAGNOSIS — Z792 Long term (current) use of antibiotics: Secondary | ICD-10-CM | POA: Diagnosis not present

## 2015-07-02 DIAGNOSIS — K219 Gastro-esophageal reflux disease without esophagitis: Secondary | ICD-10-CM | POA: Insufficient documentation

## 2015-07-02 DIAGNOSIS — Z79899 Other long term (current) drug therapy: Secondary | ICD-10-CM | POA: Insufficient documentation

## 2015-07-02 DIAGNOSIS — I10 Essential (primary) hypertension: Secondary | ICD-10-CM | POA: Insufficient documentation

## 2015-07-02 DIAGNOSIS — Z85828 Personal history of other malignant neoplasm of skin: Secondary | ICD-10-CM | POA: Diagnosis not present

## 2015-07-02 DIAGNOSIS — B20 Human immunodeficiency virus [HIV] disease: Secondary | ICD-10-CM | POA: Insufficient documentation

## 2015-07-02 DIAGNOSIS — Z87828 Personal history of other (healed) physical injury and trauma: Secondary | ICD-10-CM | POA: Diagnosis not present

## 2015-07-02 DIAGNOSIS — Z880 Allergy status to penicillin: Secondary | ICD-10-CM | POA: Diagnosis not present

## 2015-07-02 DIAGNOSIS — Z8709 Personal history of other diseases of the respiratory system: Secondary | ICD-10-CM | POA: Diagnosis not present

## 2015-07-02 DIAGNOSIS — Z862 Personal history of diseases of the blood and blood-forming organs and certain disorders involving the immune mechanism: Secondary | ICD-10-CM | POA: Diagnosis not present

## 2015-07-02 DIAGNOSIS — Z87438 Personal history of other diseases of male genital organs: Secondary | ICD-10-CM | POA: Diagnosis not present

## 2015-07-02 DIAGNOSIS — Z8669 Personal history of other diseases of the nervous system and sense organs: Secondary | ICD-10-CM | POA: Diagnosis not present

## 2015-07-02 DIAGNOSIS — R2243 Localized swelling, mass and lump, lower limb, bilateral: Secondary | ICD-10-CM | POA: Diagnosis present

## 2015-07-02 DIAGNOSIS — F329 Major depressive disorder, single episode, unspecified: Secondary | ICD-10-CM | POA: Insufficient documentation

## 2015-07-02 DIAGNOSIS — Z87891 Personal history of nicotine dependence: Secondary | ICD-10-CM | POA: Insufficient documentation

## 2015-07-02 DIAGNOSIS — R600 Localized edema: Secondary | ICD-10-CM

## 2015-07-02 DIAGNOSIS — F419 Anxiety disorder, unspecified: Secondary | ICD-10-CM | POA: Diagnosis not present

## 2015-07-02 DIAGNOSIS — Z8781 Personal history of (healed) traumatic fracture: Secondary | ICD-10-CM | POA: Diagnosis not present

## 2015-07-02 DIAGNOSIS — L03115 Cellulitis of right lower limb: Secondary | ICD-10-CM | POA: Insufficient documentation

## 2015-07-02 DIAGNOSIS — M7989 Other specified soft tissue disorders: Secondary | ICD-10-CM

## 2015-07-02 MED ORDER — SULFAMETHOXAZOLE-TRIMETHOPRIM 800-160 MG PO TABS: 1.0000 | ORAL_TABLET | Freq: Once | ORAL | Status: DC

## 2015-07-02 MED ORDER — SULFAMETHOXAZOLE-TRIMETHOPRIM 800-160 MG PO TABS: 1.0000 | ORAL_TABLET | Freq: Two times a day (BID) | ORAL | Status: DC

## 2015-07-02 NOTE — Discharge Instructions (Signed)
As discussed, it is important that you follow up as soon as possible with your physician for continued management of your condition.  If you develop any new, or concerning changes in your condition, please return to the emergency department immediately.  Cellulitis Cellulitis is an infection of the skin and the tissue beneath it. The infected area is usually red and tender. Cellulitis occurs most often in the arms and lower legs.  CAUSES  Cellulitis is caused by bacteria that enter the skin through cracks or cuts in the skin. The most common types of bacteria that cause cellulitis are staphylococci and streptococci. SIGNS AND SYMPTOMS   Redness and warmth.  Swelling.  Tenderness or pain.  Fever. DIAGNOSIS  Your health care provider can usually determine what is wrong based on a physical exam. Blood tests may also be done. TREATMENT  Treatment usually involves taking an antibiotic medicine. HOME CARE INSTRUCTIONS   Take your antibiotic medicine as directed by your health care provider. Finish the antibiotic even if you start to feel better.  Keep the infected arm or leg elevated to reduce swelling.  Apply a warm cloth to the affected area up to 4 times per day to relieve pain.  Take medicines only as directed by your health care provider.  Keep all follow-up visits as directed by your health care provider. SEEK MEDICAL CARE IF:   You notice red streaks coming from the infected area.  Your red area gets larger or turns dark in color.  Your bone or joint underneath the infected area becomes painful after the skin has healed.  Your infection returns in the same area or another area.  You notice a swollen bump in the infected area.  You develop new symptoms.  You have a fever. SEEK IMMEDIATE MEDICAL CARE IF:   You feel very sleepy.  You develop vomiting or diarrhea.  You have a general ill feeling (malaise) with muscle aches and pains.   This information is not  intended to replace advice given to you by your health care provider. Make sure you discuss any questions you have with your health care provider.   Document Released: 03/10/2005 Document Revised: 02/19/2015 Document Reviewed: 08/16/2011 Elsevier Interactive Patient Education 2016 Elsevier Inc.  Peripheral Edema You have swelling in your legs (peripheral edema). This swelling is due to excess accumulation of salt and water in your body. Edema may be a sign of heart, kidney or liver disease, or a side effect of a medication. It may also be due to problems in the leg veins. Elevating your legs and using special support stockings may be very helpful, if the cause of the swelling is due to poor venous circulation. Avoid long periods of standing, whatever the cause. Treatment of edema depends on identifying the cause. Chips, pretzels, pickles and other salty foods should be avoided. Restricting salt in your diet is almost always needed. Water pills (diuretics) are often used to remove the excess salt and water from your body via urine. These medicines prevent the kidney from reabsorbing sodium. This increases urine flow. Diuretic treatment may also result in lowering of potassium levels in your body. Potassium supplements may be needed if you have to use diuretics daily. Daily weights can help you keep track of your progress in clearing your edema. You should call your caregiver for follow up care as recommended. SEEK IMMEDIATE MEDICAL CARE IF:   You have increased swelling, pain, redness, or heat in your legs.  You develop shortness of  breath, especially when lying down.  You develop chest or abdominal pain, weakness, or fainting.  You have a fever.   This information is not intended to replace advice given to you by your health care provider. Make sure you discuss any questions you have with your health care provider.   Document Released: 07/08/2004 Document Revised: 08/23/2011 Document Reviewed:  12/11/2014 Elsevier Interactive Patient Education [employer].

## 2015-07-02 NOTE — Telephone Encounter (Signed)
Patient went to the ER yesterday and not seen after 7 hours and he left.  Discussed with Dr. Julio Sicks patient was to go tot he ER for IV diuretics for worsening bilateral pitting pedal edema.  Per Dr. Carlean Purl if there is a bed at [facility] or [facility] he needs direct admitted, but if not will need to go back to the ER for admission.  If he needs to go to the ER charge nurse to be notified of plans.  No beds are available at [facility] and [facility] has 3 in front of the patient and it is unlikely he will get a bed today.   Left message for patient to call back

## 2015-07-02 NOTE — ED Notes (Signed)
Per pt, states he has B/L selling in both ankles-states he is here to have them "drained"

## 2015-07-02 NOTE — Telephone Encounter (Signed)
I called mom  Following recommendations  Take Septra as Rxed  Use furosemide 80 mg bid for now to reduce edema  i will message Dr. Tommy Medal to see if he can see [place] in 1-2 weeks

## 2015-07-02 NOTE — ED Provider Notes (Signed)
CSN: [id number]     Arrival date & time 07/02/15  1322 History   First MD Initiated Contact with Patient 07/02/15 1347     Chief Complaint  Patient presents with  . Leg Swelling    HPI  Patient presents with his mother who assists with the history of present illness. Patient has a long history of bilateral lower extremity edema, thinks that over the past days, swelling is become worse on the right side. No new dyspnea, no chest pain, no syncope. After the symptoms began, the patient began to attempt using support hose. However, soon thereafter the patient developed papular, then confluent erythematous rash in the right ankle. Currently he denies fever. Patient has a most notable history for HIV, depression, prior alcoholism.   Past Medical History  Diagnosis Date  . HIV DISEASE 06/23/2006  . HYPERLIPIDEMIA 06/23/2006  . ANXIETY 06/23/2006  . DEPRESSION 06/23/2006  . HYPERTENSION 06/23/2006  . 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 Ascites 11/13/2009  . STRAIN, CHEST WALL 03/15/2007  . 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 Gastric ulcer 06/2010  . Alcoholic cirrhosis of liver ([place])   . Alcoholism, chronic ([place])   . Iron deficiency anemia   . GERD (gastroesophageal reflux disease)   . SBP (spontaneous bacterial peritonitis) ([place]) 05/03/2011    Suspected by high leukocytes on paracentesis. Clinical scenario also compatible. November 2012 responded to Levaquin. Started on trimethoprim-sulfamethoxazole double strength daily for prophylaxis.   . Obesity (BMI 30-39.9)     BMI 34 kg/m^2  . Left foot infection   . Ascites   . Portal hypertensive gastropathy 01/02/2013  . Blood dyscrasia     HIV  . Skin cancer     left calf  . 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 Avulsion fracture of middle phalanges of  3rd/4th fingers left hand 01/07/2014   Past Surgical History  Procedure  Laterality Date  . Esophagogastroduodenoscopy w/ banding  06/26/2010    variceal ligation  . Carpal tunnel release      left  . Esophagogastroduodenoscopy  07/01/2010;  08/12/10    small varices, gastric ulcer  . Esophagogastroduodenoscopy  10/26/2011    Procedure: ESOPHAGOGASTRODUODENOSCOPY (EGD);  Surgeon: Gatha Mayer, MD;  Location: [facility];  Service: Endoscopy;  Laterality: N/A;  . Upper gastrointestinal endoscopy    . Colonoscopy  march 2013  . Esophagogastroduodenoscopy N/A 01/02/2013    Procedure: ESOPHAGOGASTRODUODENOSCOPY (EGD);  Surgeon: Gatha Mayer, MD;  Location: [facility];  Service: Endoscopy;  Laterality: N/A;  . Gastric varices banding N/A 01/02/2013    Procedure: GASTRIC VARICES BANDING;  Surgeon: Gatha Mayer, MD;  Location: [facility];  Service: Endoscopy;  Laterality: N/A;  possible banding  . Esophagogastroduodenoscopy N/A 04/23/2013    Procedure: ESOPHAGOGASTRODUODENOSCOPY (EGD);  Surgeon: Gatha Mayer, MD;  Location: [facility];  Service: Endoscopy;  Laterality: N/A;  . Esophagogastroduodenoscopy N/A 05/01/2013    Procedure: ESOPHAGOGASTRODUODENOSCOPY (EGD);  Surgeon: Gatha Mayer, MD;  Location: [facility];  Service: Endoscopy;  Laterality: N/A;  follow-up varices and possibly band them  . Esophageal banding  05/01/2013    Procedure: ESOPHAGEAL BANDING;  Surgeon: Gatha Mayer, MD;  Location: [facility];  Service: Endoscopy;;  . Esophagogastroduodenoscopy N/A 09/04/2013    Procedure: ESOPHAGOGASTRODUODENOSCOPY (EGD);  Surgeon: Gatha Mayer, MD;  Location: [facility];  Service: Endoscopy;  Laterality: N/A;  . Esophagogastroduodenoscopy N/A 09/10/2014    Procedure: ESOPHAGOGASTRODUODENOSCOPY (EGD);  Surgeon: Gatha Mayer, MD;  Location: [facility];  Service: Endoscopy;  Laterality: N/A;   Family History  Problem Relation Age of Onset  . Hyperlipidemia Mother   . Hypertension Mother   . Breast cancer Mother     questionable  . Heart  disease Mother   . Hypertension Father   . Irritable bowel syndrome Father   . Drug abuse Brother   . Heart disease Brother   . Breast cancer Maternal Aunt     maternal great aunt  . Colon cancer Neg Hx   . Alcohol abuse Other   . Heart disease Maternal Uncle   . Irritable bowel syndrome Paternal Aunt    Social History  Substance Use Topics  . Smoking status: Former Smoker -- 0.10 packs/day for 10 years    Types: Cigarettes    Start date: 03/03/2014    Quit date: 04/03/2014  . Smokeless tobacco: Never Used     Comment: form given 05-19-11  . Alcohol Use: No     Comment: pt is an alcoholic currently in [employer] meetings    Review of Systems  Constitutional:       Per HPI, otherwise negative  HENT:       Per HPI, otherwise negative  Respiratory:       Per HPI, otherwise negative  Cardiovascular:       Per HPI, otherwise negative  Gastrointestinal: Negative for vomiting.  Endocrine:       Negative aside from HPI  Genitourinary:       Neg aside from HPI   Musculoskeletal:       Per HPI, otherwise negative  Skin: Positive for color change and wound.  Allergic/Immunologic: Positive for immunocompromised state.  Neurological: Negative for syncope.      Allergies  Penicillins  Home Medications   Prior to Admission medications   Medication Sig Start Date End Date Taking? Authorizing Provider  acyclovir ointment (ZOVIRAX) 5 % APPLY SUFFICIENT AMOUNT TO AA 5 TIMES A DAY FOR 5 DAYS 12/17/14  Yes Historical Provider, MD  ALPRAZolam (XANAX) 1 MG tablet TAKE 1 TABLET BY MOUTH TWICE DAILY Patient taking differently: TAKE 0.5 TABLET BY MOUTH TWICE DAILY 05/19/15  Yes Gatha Mayer, MD  aMILoride (MIDAMOR) 5 MG tablet Take 3 tablets (15 mg total) by mouth daily. 12/31/14  Yes Gatha Mayer, MD  amitriptyline (ELAVIL) 50 MG tablet Take 50 mg by mouth daily. 06/20/15  Yes Historical Provider, MD  Docusate Calcium (STOOL SOFTENER PO) Take 3 tablets by mouth at bedtime.   Yes  Historical Provider, MD  dolutegravir (TIVICAY) 50 MG tablet Take 1 tablet (50 mg total) by mouth daily. 07/03/14  Yes Truman Hayward, MD  emtricitabine-tenofovir AF (DESCOVY) 200-25 MG per tablet Take 1 tablet by mouth daily. 12/23/14  Yes Truman Hayward, MD  esomeprazole (NEXIUM) 40 MG capsule Take 1 capsule (40 mg total) by mouth daily before breakfast. 01/03/15  Yes Gatha Mayer, MD  furosemide (LASIX) 40 MG tablet Take 2 tablets (80 mg total) by mouth daily. Patient taking differently: Take 40 mg by mouth 2 (two) times daily.  06/23/15  Yes Gatha Mayer, MD  Goldenseal 400 MG CAPS Take 1 capsule by mouth daily.   Yes Historical Provider, MD  lactulose (CHRONULAC) 10 GM/15ML solution TAKE 60 ML BY MOUTH 4 TIMES DAILY. 12/12/14  Yes Gatha Mayer, MD  PAZEO 0.7 % SOLN Place 1 drop into both eyes 2 (two) times daily.  12/25/14  Yes Historical Provider, MD  Polyethyl Glycol-Propyl Glycol (SYSTANE OP) Apply 1 drop to eye as needed (dry eyes).   Yes Historical Provider, MD  polyethylene glycol powder (MIRALAX) powder Take 17 g by mouth 2 (two) times daily. 10/09/14  Yes Gatha Mayer, MD  potassium chloride SA (K-DUR,KLOR-CON) 20 MEQ tablet TAKE 2 TABLETS BY MOUTH EVERY DAY Patient taking differently: take 35meq twice daily 06/03/15  Yes Gatha Mayer, MD  PROAIR HFA 108 [phone] BASE) MCG/ACT inhaler Inhale 2 puffs into the lungs every 6 (six) hours as needed for wheezing or shortness of breath (wheezing).  09/30/14  Yes Historical Provider, MD  rifaximin (XIFAXAN) 550 MG TABS tablet Take 1 tablet (550 mg total) by mouth 2 (two) times daily. 04/14/15  Yes Gatha Mayer, MD  senna (SENOKOT) 8.6 MG TABS tablet Take 2 tablets (17.2 mg total) by mouth daily. Start with one tablet Patient taking differently: Take 1 tablet by mouth 2 (two) times daily. Start with one tablet 12/31/14  Yes Gatha Mayer, MD  sodium phosphate (FLEET) enema Place 1 enema rectally daily. Takes at night   Yes Historical  Provider, MD  SSD 1 % cream Apply 1 application topically daily as needed (Fever blisters.).  07/02/14  Yes Historical Provider, MD  triamcinolone cream (KENALOG) 0.1 % APP THIN LAYER EXT AA BID PRF ITCHING OR RASH 12/18/14  Yes Historical Provider, MD  vitamin E 400 UNIT capsule Take 400 Units by mouth daily.   Yes Historical Provider, MD  AMBULATORY NON FORMULARY MEDICATION Lift Chair- Use as needed Dx: generalized weakness, hepatic encephalopathy 01/23/15   Laban Emperor Zehr, PA-C  sulfamethoxazole-trimethoprim (BACTRIM DS,SEPTRA DS) 800-160 MG tablet Take 1 tablet by mouth 2 (two) times daily. 07/02/15   Carmin Muskrat, MD   Pulse 80  Temp(Src) 99.3 F (37.4 C) (Oral)  SpO2 98% Physical Exam  Constitutional: He is oriented to person, place, and time. He appears well-developed. No distress.  Morbidly obese male sitting in gurney awake, alert, interacting appropriately.  HENT:  Head: Normocephalic and atraumatic.  Eyes: Conjunctivae and EOM are normal.  Cardiovascular: Normal rate and regular rhythm.   Pulmonary/Chest: Effort normal. No stridor. No respiratory distress.  Abdominal:  Large pannus, no other deformity  Musculoskeletal: He exhibits edema.  Notable lower extremity edema, right greater than left, throughout.  Neurological: He is alert and oriented to person, place, and time.  Skin: Skin is warm and dry.     Psychiatric: He has a normal mood and affect.  Nursing note and vitals reviewed.   ED Course  Procedures (including critical care time)   With concern for DVT, the patient had ultrasound performed.   3:40 PM Ultrasound reassuring, results discussed with the patient's mother.   MDM   Final diagnoses:  Cellulitis of right lower extremity  Bilateral edema of lower extremity   Obese male with HIV presents with new right leg lesion, as well as bilateral lower extremity swelling. No evidence for DVT, no history of heart failure, no current dyspnea,  hypoxia. Patient started on antibiotics for cellulitis, will follow with primary care and infectious disease.  Carmin Muskrat, MD 07/02/15 1540

## 2015-07-02 NOTE — Progress Notes (Signed)
VASCULAR LAB PRELIMINARY  PRELIMINARY  PRELIMINARY  PRELIMINARY  Bilateral lower extremity venous duplex completed.    Preliminary report:  Bilateral:  No evidence of DVT, superficial thrombosis, or Baker's Cyst.   Delante Karapetyan, RVS 07/02/2015, 3:10 PM

## 2015-07-02 NOTE — Telephone Encounter (Signed)
Patient and his mom notified to return to ED.  Charge nurse at [facility] notified that patient is coming.

## 2015-07-02 NOTE — ED Notes (Signed)
DELAY IN BLOOD WORK DUE TO ULTRASOUND AT BEDSIDE.

## 2015-07-02 NOTE — Telephone Encounter (Signed)
See earlier phone notes.  Dr. Carlean Purl notified that the patient is being discharged.  He will review and advise

## 2015-07-03 ENCOUNTER — Telehealth: Payer: Self-pay | Admitting: Internal Medicine

## 2015-07-03 ENCOUNTER — Telehealth: Payer: Self-pay | Admitting: *Deleted

## 2015-07-03 NOTE — Telephone Encounter (Signed)
Dr. Tommy Medal requesting pt come for an appointment.

## 2015-07-03 NOTE — Telephone Encounter (Signed)
Questions about antibiotics answered

## 2015-07-03 NOTE — Telephone Encounter (Signed)
-----   Message from Truman Hayward, MD sent at 07/02/2015  5:13 PM EST ----- Regarding: RE: Can you see him? Sure we can see him. I am ccing my staff. We will look for a spot for him. His Mom was fairly belligerant when we introduced RN to see him in the home. I will cc Ambre as well (RN) who we had to go see him from our clinic thanks Glendell Docker!   ----- Message -----    From: Gatha Mayer, MD    Sent: 07/02/2015   4:55 PM      To: Truman Hayward, MD Subject: Can you see him?                               He has worsening edema ED thought he had cellulitis and stared Septra  I am increasing his diuretics at least temporarily  I told mom I would message you about seeing him  I think he is heading to a point where he will not be able to live at home - I discussed that at last visit but she did not think that was now.   I am not fascile with things like this but ? If he could qualify for a rehab stay somewhere?  Probably needs an admission first and may not go for it   Thanks  Glendell Docker

## 2015-07-06 ENCOUNTER — Other Ambulatory Visit: Payer: Self-pay | Admitting: Internal Medicine

## 2015-07-11 ENCOUNTER — Other Ambulatory Visit: Payer: Self-pay | Admitting: Internal Medicine

## 2015-07-11 NOTE — Telephone Encounter (Signed)
Please advise [name], thank you. 

## 2015-07-13 NOTE — Telephone Encounter (Signed)
Ok to refill x 3 

## 2015-07-14 ENCOUNTER — Ambulatory Visit: Payer: Medicaid Other | Admitting: *Deleted

## 2015-07-14 ENCOUNTER — Ambulatory Visit (INDEPENDENT_AMBULATORY_CARE_PROVIDER_SITE_OTHER): Payer: Medicaid Other | Admitting: Infectious Disease

## 2015-07-14 ENCOUNTER — Other Ambulatory Visit (HOSPITAL_COMMUNITY)
Admission: RE | Admit: 2015-07-14 | Discharge: 2015-07-14 | Disposition: A | Discharge status: Home / Self Care | Payer: Medicaid Other | Source: Ambulatory Visit | Attending: Infectious Disease | Admitting: Infectious Disease

## 2015-07-14 ENCOUNTER — Encounter: Payer: Self-pay | Admitting: Infectious Disease

## 2015-07-14 VITALS — BP 135/87 | HR 82 | Temp 97.7°F | Wt 301.0 lb

## 2015-07-14 DIAGNOSIS — Z113 Encounter for screening for infections with a predominantly sexual mode of transmission: Secondary | ICD-10-CM | POA: Diagnosis present

## 2015-07-14 DIAGNOSIS — N528 Other male erectile dysfunction: Secondary | ICD-10-CM | POA: Diagnosis not present

## 2015-07-14 DIAGNOSIS — K7682 Hepatic encephalopathy: Secondary | ICD-10-CM

## 2015-07-14 DIAGNOSIS — B20 Human immunodeficiency virus [HIV] disease: Secondary | ICD-10-CM

## 2015-07-14 DIAGNOSIS — K7290 Hepatic failure, unspecified without coma: Secondary | ICD-10-CM

## 2015-07-14 DIAGNOSIS — Z23 Encounter for immunization: Secondary | ICD-10-CM

## 2015-07-14 DIAGNOSIS — K7030 Alcoholic cirrhosis of liver without ascites: Secondary | ICD-10-CM | POA: Diagnosis not present

## 2015-07-14 DIAGNOSIS — D61818 Other pancytopenia: Secondary | ICD-10-CM

## 2015-07-14 DIAGNOSIS — I8500 Esophageal varices without bleeding: Secondary | ICD-10-CM | POA: Diagnosis not present

## 2015-07-14 DIAGNOSIS — N529 Male erectile dysfunction, unspecified: Secondary | ICD-10-CM

## 2015-07-14 HISTORY — DX: Male erectile dysfunction, unspecified: N52.9

## 2015-07-14 LAB — COMPLETE METABOLIC PANEL WITH GFR
ALT: 23 U/L (ref 9–46)
AST: 30 U/L (ref 10–35)
Albumin: 4.2 g/dL (ref 3.6–5.1)
Alkaline Phosphatase: 106 U/L (ref 40–115)
BUN: 10 mg/dL (ref 7–25)
CO2: 25 mmol/L (ref 20–31)
Calcium: 9.2 mg/dL (ref 8.6–10.3)
Chloride: 101 mmol/L (ref 98–110)
Creat: 1.01 mg/dL (ref 0.70–1.33)
GFR, EST NON AFRICAN AMERICAN: 86 mL/min (ref >=60)
GLUCOSE: 118 mg/dL — AB (ref 65–99)
POTASSIUM: 3.1 mmol/L — AB (ref 3.5–5.3)
SODIUM: 138 mmol/L (ref 135–146)
Total Bilirubin: 1.8 mg/dL — ABNORMAL HIGH (ref 0.2–1.2)
Total Protein: 6.7 g/dL (ref 6.1–8.1)

## 2015-07-14 LAB — CBC WITH DIFFERENTIAL/PLATELET
Basophils Absolute: 0 10*3/uL (ref 0.0–0.1)
Basophils Relative: 1 % (ref 0–1)
EOS ABS: 0.1 10*3/uL (ref 0.0–0.7)
Eosinophils Relative: 2 % (ref 0–5)
HCT: 38.1 % — ABNORMAL LOW (ref 39.0–52.0)
Hemoglobin: 12.6 g/dL — ABNORMAL LOW (ref 13.0–17.0)
LYMPHS ABS: 0.8 10*3/uL (ref 0.7–4.0)
Lymphocytes Relative: 24 % (ref 12–46)
MCH: 26.8 pg (ref 26.0–34.0)
MCHC: 33.1 g/dL (ref 30.0–36.0)
MCV: 81.1 fL (ref 78.0–100.0)
MONO ABS: 0.4 10*3/uL (ref 0.1–1.0)
MONOS PCT: 12 % (ref 3–12)
Neutro Abs: 2 10*3/uL (ref 1.7–7.7)
Neutrophils Relative %: 61 % (ref 43–77)
PLATELETS: 89 10*3/uL — AB (ref 150–400)
RBC: 4.7 MIL/uL (ref 4.22–5.81)
RDW: 16.9 % — ABNORMAL HIGH (ref 11.5–15.5)
WBC: 3.3 10*3/uL — ABNORMAL LOW (ref 4.0–10.5)

## 2015-07-14 LAB — AMMONIA: Ammonia: 140 umol/L — ABNORMAL HIGH (ref 16–53)

## 2015-07-14 NOTE — Telephone Encounter (Signed)
RX faxed to [facility] in [place] as requested.

## 2015-07-14 NOTE — Progress Notes (Signed)
Chief complaint: having trouble with fatigue, also suffering from ED Subjective:    Patient ID: [name], male    DOB: [birth year], 50 y.o.   MRN: [id number]  HPI   [name] is a 50 y.o. male with HIV infection, cirrhosis,  who had been  doing superbly well on his antiviral regimen ofTivicay and nd Descovy and his VL remains suppressed and CD4 is above 200.  Lab Results  Component Value Date   HIV1RNAQUANT 27* 04/21/2015   Lab Results  Component Value Date   CD4TABS 210* 04/21/2015   CD4TABS 240* 12/23/2014   CD4TABS 250* 10/09/2014     He has been  evaluated at [facility] for possible transplant but tells me they have told him that he is still 10 years out for need for transplant.  He was previously having  problems with falling esp when getting onto and off of exercise bike at the gym.    I had attempted to have an RN visit him in home and assess home for safety but she was rejected by Mother who was here today  He has  not seeing PCP regularly when I saw him last.   He HAS been following with Dr. Carlean Purl who has become concerned by repeated episodes of confusion as per his last office visit and phone notes.  He was seen in ED and rx for cellulitis recently.  I had him meet with Rolena Infante our counselor today.  I again would like to have [employer] visit with him and he told me he was fine with this.  Later after I had thought we were done with his visit he came to my work pod and told me again about problems with ED that have been made worse ever since his father threw out his CD collection with pornography. He asked me to write a letter to his father to let him have his pornography back.  Past Medical History  Diagnosis Date  . HIV DISEASE 06/23/2006  . HYPERLIPIDEMIA 06/23/2006  . ANXIETY 06/23/2006  . DEPRESSION 06/23/2006  . HYPERTENSION 06/23/2006  . 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 Ascites 11/13/2009  . STRAIN, CHEST WALL 03/15/2007  . Varices, esophageal ([place]) 06/2010  . Gastric ulcer 06/2010  . Alcoholic cirrhosis of liver ([place])   . Alcoholism, chronic ([place])   . Iron deficiency anemia   . GERD (gastroesophageal reflux disease)   . SBP (spontaneous bacterial peritonitis) ([place]) 05/03/2011    Suspected by high leukocytes on paracentesis. Clinical scenario also compatible. November 2012 responded to Levaquin. Started on trimethoprim-sulfamethoxazole double strength daily for prophylaxis.   . Obesity (BMI 30-39.9)     BMI 34 kg/m^2  . Left foot infection   . Ascites   . Portal hypertensive gastropathy 01/02/2013  . Blood dyscrasia     HIV  . Skin cancer     left calf  . 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 Avulsion fracture of middle phalanges of  3rd/4th fingers left hand 01/07/2014    Past Surgical History  Procedure Laterality Date  . Esophagogastroduodenoscopy w/ banding  06/26/2010    variceal ligation  . Carpal tunnel release      left  . Esophagogastroduodenoscopy  07/01/2010;  08/12/10    small varices, gastric ulcer  . Esophagogastroduodenoscopy  10/26/2011    Procedure: ESOPHAGOGASTRODUODENOSCOPY (EGD);  Surgeon: Gatha Mayer, MD;  Location: [facility];  Service:  Endoscopy;  Laterality: N/A;  . Upper gastrointestinal endoscopy    . Colonoscopy  march 2013  . Esophagogastroduodenoscopy N/A 01/02/2013    Procedure: ESOPHAGOGASTRODUODENOSCOPY (EGD);  Surgeon: Gatha Mayer, MD;  Location: [facility];  Service: Endoscopy;  Laterality: N/A;  . Gastric varices banding N/A 01/02/2013    Procedure: GASTRIC VARICES BANDING;  Surgeon: Gatha Mayer, MD;  Location: [facility];  Service: Endoscopy;  Laterality: N/A;  possible banding  . Esophagogastroduodenoscopy N/A 04/23/2013    Procedure: ESOPHAGOGASTRODUODENOSCOPY (EGD);  Surgeon: Gatha Mayer, MD;  Location: [facility];  Service: Endoscopy;  Laterality:  N/A;  . Esophagogastroduodenoscopy N/A 05/01/2013    Procedure: ESOPHAGOGASTRODUODENOSCOPY (EGD);  Surgeon: Gatha Mayer, MD;  Location: [facility];  Service: Endoscopy;  Laterality: N/A;  follow-up varices and possibly band them  . Esophageal banding  05/01/2013    Procedure: ESOPHAGEAL BANDING;  Surgeon: Gatha Mayer, MD;  Location: [facility];  Service: Endoscopy;;  . Esophagogastroduodenoscopy N/A 09/04/2013    Procedure: ESOPHAGOGASTRODUODENOSCOPY (EGD);  Surgeon: Gatha Mayer, MD;  Location: [facility];  Service: Endoscopy;  Laterality: N/A;  . Esophagogastroduodenoscopy N/A 09/10/2014    Procedure: ESOPHAGOGASTRODUODENOSCOPY (EGD);  Surgeon: Gatha Mayer, MD;  Location: [facility];  Service: Endoscopy;  Laterality: N/A;    Family History  Problem Relation Age of Onset  . Hyperlipidemia Mother   . Hypertension Mother   . Breast cancer Mother     questionable  . Heart disease Mother   . Hypertension Father   . Irritable bowel syndrome Father   . Drug abuse Brother   . Heart disease Brother   . Breast cancer Maternal Aunt     maternal great aunt  . Colon cancer Neg Hx   . Alcohol abuse Other   . Heart disease Maternal Uncle   . Irritable bowel syndrome Paternal Aunt       Social History   Social History  . Marital Status: Single    Spouse Name: N/A  . Number of Children: N/A  . Years of Education: N/A   Occupational History  . disability    Social History Main Topics  . Smoking status: Former Smoker -- 0.10 packs/day for 10 years    Types: Cigarettes    Start date: 03/03/2014    Quit date: 04/03/2014  . Smokeless tobacco: Never Used     Comment: form given 05-19-11  . Alcohol Use: No     Comment: pt is an alcoholic currently in [employer]  . Drug Use: No  . Sexual Activity:    Partners: Male    Birth Control/ Protection: Condom     Comment: pt. declined condoms   Other Topics Concern  . None   Social History Narrative   Single,  disabled hair stylist   Lives with parents in a home with a basement.  Parents do not like for him to go down stairs because they are steep.               Allergies  Allergen Reactions  . Penicillins     REACTION: hives Has patient had a PCN reaction causing immediate rash, facial/tongue/throat swelling, SOB or lightheadedness with hypotension: Yes Has patient had a PCN reaction causing severe rash involving mucus membranes or skin necrosis: Yes Has patient had a PCN reaction that required hospitalization No Has patient had a PCN reaction occurring within the last 10 years: Yes If all of the above answers are "NO", then may proceed with Cephalosporin  use.      Current outpatient prescriptions:  .  acyclovir ointment (ZOVIRAX) 5 %, APPLY SUFFICIENT AMOUNT TO AA 5 TIMES A DAY FOR 5 DAYS, Disp: , Rfl: 1 .  ALPRAZolam (XANAX) 1 MG tablet, TAKE 1 TABLET BY MOUTH TWICE DAILY, Disp: 60 tablet, Rfl: 2 .  AMBULATORY NON FORMULARY MEDICATION, Lift Chair- Use as needed Dx: generalized weakness, hepatic encephalopathy, Disp: 1 each, Rfl: 0 .  aMILoride (MIDAMOR) 5 MG tablet, Take 3 tablets (15 mg total) by mouth daily., Disp: 90 tablet, Rfl: 5 .  amitriptyline (ELAVIL) 50 MG tablet, Take 50 mg by mouth daily., Disp: , Rfl: 2 .  Docusate Calcium (STOOL SOFTENER PO), Take 3 tablets by mouth at bedtime., Disp: , Rfl:  .  dolutegravir (TIVICAY) 50 MG tablet, Take 1 tablet (50 mg total) by mouth daily., Disp: 30 tablet, Rfl: 11 .  emtricitabine-tenofovir AF (DESCOVY) 200-25 MG per tablet, Take 1 tablet by mouth daily., Disp: 30 tablet, Rfl: 11 .  esomeprazole (NEXIUM) 40 MG capsule, Take 1 capsule (40 mg total) by mouth daily before breakfast., Disp: 30 capsule, Rfl: 0 .  furosemide (LASIX) 40 MG tablet, Take 2 tablets (80 mg total) by mouth daily. (Patient taking differently: Take 40 mg by mouth 2 (two) times daily. ), Disp: 60 tablet, Rfl: 7 .  Goldenseal 400 MG CAPS, Take 1 capsule by mouth daily.,  Disp: , Rfl:  .  lactulose (CHRONULAC) 10 GM/15ML solution, TAKE 60 ML BY MOUTH 4 TIMES DAILY., Disp: 7316 mL, Rfl: 11 .  PAZEO 0.7 % SOLN, Place 1 drop into both eyes 2 (two) times daily., Disp: , Rfl:  .  Polyethyl Glycol-Propyl Glycol (SYSTANE OP), Apply 1 drop to eye as needed (dry eyes)., Disp: , Rfl:  .  polyethylene glycol powder (MIRALAX) powder, Take 17 g by mouth 2 (two) times daily., Disp: 850 g, Rfl: 11 .  potassium chloride SA (K-DUR,KLOR-CON) 20 MEQ tablet, TAKE 2 TABLETS BY MOUTH EVERY DAY (Patient taking differently: take 22meq twice daily), Disp: 60 tablet, Rfl: 3 .  senna (SENOKOT) 8.6 MG TABS tablet, TAKE 2 TABLETS(17.2 MG) BY MOUTH DAILY. START WITH 1 TABLET, Disp: 60 tablet, Rfl: 0 .  sodium phosphate (FLEET) enema, Place 1 enema rectally daily. Takes at night, Disp: , Rfl:  .  SSD 1 % cream, Apply 1 application topically daily as needed (Fever blisters.). , Disp: , Rfl: 0 .  triamcinolone cream (KENALOG) 0.1 %, APP THIN LAYER EXT AA BID PRF ITCHING OR RASH, Disp: , Rfl: 1 .  vitamin E 400 UNIT capsule, Take 400 Units by mouth daily., Disp: , Rfl:  .  rifaximin (XIFAXAN) 550 MG TABS tablet, Take 1 tablet (550 mg total) by mouth 2 (two) times daily., Disp: 60 tablet, Rfl: 5 .  sulfamethoxazole-trimethoprim (BACTRIM DS,SEPTRA DS) 800-160 MG tablet, Take 1 tablet by mouth 2 (two) times daily. (Patient not taking: Reported on 07/14/2015), Disp: 14 tablet, Rfl: 0   Review of Systems  Constitutional: Negative for fever, chills, diaphoresis and unexpected weight change.  HENT: Negative for congestion, rhinorrhea, sinus pressure, sneezing, sore throat and trouble swallowing.   Eyes: Negative for photophobia and visual disturbance.  Respiratory: Negative for cough, chest tightness, shortness of breath, wheezing and stridor.   Cardiovascular: Negative for chest pain, palpitations and leg swelling.  Gastrointestinal: Negative for nausea, vomiting, abdominal pain, diarrhea, constipation,  blood in stool, abdominal distention and anal bleeding.  Genitourinary: Negative for dysuria, hematuria, flank pain and difficulty urinating.  Musculoskeletal: Positive for gait problem. Negative for myalgias, back pain, joint swelling and arthralgias.  Skin: Negative for color change, pallor, rash and wound.  Neurological: Positive for weakness. Negative for dizziness, tremors, seizures and light-headedness.  Hematological: Negative for adenopathy. Does not bruise/bleed easily.  Psychiatric/Behavioral: Positive for confusion and sleep disturbance. Negative for behavioral problems, dysphoric mood, decreased concentration and agitation. The patient is not nervous/anxious.        Objective:   Physical Exam  Constitutional: He is oriented to person, place, and time. He appears well-developed and well-nourished. No distress.  HENT:  Head: Normocephalic and atraumatic.  Mouth/Throat: Oropharynx is clear and moist. No oropharyngeal exudate.  Eyes: Conjunctivae and EOM are normal. Pupils are equal, round, and reactive to light. No scleral icterus.  Neck: Normal range of motion. Neck supple. No JVD present.  Cardiovascular: Normal rate, regular rhythm and normal heart sounds.  Exam reveals no gallop and no friction rub.   No murmur heard. Pulmonary/Chest: Effort normal and breath sounds normal. No respiratory distress. He has no wheezes. He has no rales. He exhibits no tenderness.  Abdominal: Soft. He exhibits distension. He exhibits no mass. There is no tenderness. There is no rebound and no guarding.  Musculoskeletal: He exhibits no edema or tenderness.  Lymphadenopathy:    He has no cervical adenopathy.  Neurological: He is alert and oriented to person, place, and time. No sensory deficit. He exhibits normal muscle tone. Gait normal.  Skin: Skin is warm and dry. He is not diaphoretic. No erythema. No pallor.  Psychiatric: He has a normal mood and affect. Thought content normal. His speech is  delayed. He is slowed.   He has slight tremor of hands bilaterally but no frank "liver flap"       Assessment & Plan:   HIV: continue  TIVICAY and DESCOVy for better bone and kidney health.   Etoh induced cirrhosis with varices: followed by [facility], Dr. Carlean Purl and [facility] team. Need to make sure he continues to take his lactulose. He is having 2-3 BM and only on the lactulose not able to obtain rifaximin apparently though today he himself seemed to have no knowledge of this medication.  Hepatic encephalopathy: continues to be fairly slowed during visits with him. Will check NH4 as marker today. See below discussion  Frequent falls: may be combination of his neuropathy and his liver disease with encephalopathy, sedating meds. I am not going to add any further sedatives including gabapentin or lyrica. Or meds that could exacerbate Orthostatic hypotension  I will discuss with Ambre since I believe she has tried TWICE to see pt  Depression and problems with body image: he had been wanting plastic surgery and was depressed with re to his gynecomastia and being overweight. I would like him to have therapy with Leveda Anna and to focus on complying with rx for his hepatic encephalopathy at present rather than on weight loss or cosmetic improvements  Anxiety: I would like it if he could possibly wean further from benzos  ED: check testosterone at next time he is here. I dont want to add viagra to his meds. I do not see why he should not be allowed to have access to his pornography to masturbate though I dont know any other specifics re the pornography and why his father objects to it (other than perhaps him not being accepting of his son's sexual orientation). Regardless this would be better dealt with through counseling and perhaps a meeting with parents  and myself in clinc rather than a letter  I spent greater than 40 minutes with the patient including greater than 50% of time in face to face  counsel of the patient re his HIV, Etoh induced cirrhosis, frequent falls, hepatic encephalopathy, neuropathy, anxiety, ED and in coordination of his care.  I would like for him to start going to PCP regularly

## 2015-07-14 NOTE — Patient Instructions (Signed)
We will check blood work today  I am sure you are doing a GREAT job taking your Tivicay and Descovy  I would like you to meet with Leveda Anna regularly  I would like you to let Ambre visit your home to assess safety and come up with plans re your medicines, diet, and exercise  I would like you to back off on intensity of your exercise  I would like you to follow up very closely with Dr. Carlean Purl

## 2015-07-14 NOTE — BH Specialist Note (Signed)
Counselor met with patient per Dr. Tommy Medal request due to history of anxiety, depression, and preoccupation with his body.  Counselor observed patient to be oriented times four with flat affect.  Patient indicated that he is on Xanax for anxiety.  Patient contradicted himself a few times on the amount of Xanax he was actually taking. Patient also was observed possibly exaggerating his exercise routine stating that he was biking 20 miles.  Patient is over weight and walks slowly.  His gait is unsteady and reported a history of falls lately.  Counselor encouraged patient to receive counseling services along with his treatment to focus on his mental health and have a chance to process what is going on in his life.  Patient agreed and made an appointment to meet with counselor next week.   Rolena Infante, MA [facility]

## 2015-07-15 ENCOUNTER — Other Ambulatory Visit: Payer: Self-pay | Admitting: Internal Medicine

## 2015-07-15 LAB — HIV-1 RNA QUANT-NO REFLEX-BLD

## 2015-07-15 LAB — RPR

## 2015-07-15 LAB — T-HELPER CELL (CD4) - (RCID CLINIC ONLY)
CD4 T CELL HELPER: 24 % — AB (ref 33–55)
CD4 T Cell Abs: 190 /uL — ABNORMAL LOW (ref 400–2700)

## 2015-07-15 LAB — URINE CYTOLOGY ANCILLARY ONLY
CHLAMYDIA, DNA PROBE: NEGATIVE
Neisseria Gonorrhea: NEGATIVE

## 2015-07-15 NOTE — Telephone Encounter (Signed)
Refill both x 6

## 2015-07-15 NOTE — Progress Notes (Signed)
Quick Note:  He tends to run in 80's Either non-compliant or sick in another way  He has had constipation problems even with the lactulose and Xifaxan We thought Xifaxan was approved  I am ccing my RN to speak to his mom and try to clarify medication compliance and availability at this time.   ______

## 2015-07-15 NOTE — Telephone Encounter (Signed)
Please advise [name], thank you. 

## 2015-07-15 NOTE — Progress Notes (Signed)
Quick Note:  [name], He is seeing me 2/9  See if he can do a BMET and NH3 1-2 d before that visit ______

## 2015-07-16 ENCOUNTER — Other Ambulatory Visit: Payer: Self-pay

## 2015-07-16 DIAGNOSIS — K7030 Alcoholic cirrhosis of liver without ascites: Secondary | ICD-10-CM

## 2015-07-17 ENCOUNTER — Telehealth: Payer: Self-pay | Admitting: Internal Medicine

## 2015-07-17 NOTE — Telephone Encounter (Signed)
All questions answered about upcoming labs

## 2015-07-22 ENCOUNTER — Other Ambulatory Visit (INDEPENDENT_AMBULATORY_CARE_PROVIDER_SITE_OTHER): Payer: Medicaid Other

## 2015-07-22 DIAGNOSIS — K7030 Alcoholic cirrhosis of liver without ascites: Secondary | ICD-10-CM | POA: Diagnosis not present

## 2015-07-22 LAB — BASIC METABOLIC PANEL
BUN: 9 mg/dL (ref 6–23)
CALCIUM: 9.3 mg/dL (ref 8.4–10.5)
CO2: 30 mEq/L (ref 19–32)
Chloride: 106 mEq/L (ref 96–112)
Creatinine, Ser: 0.82 mg/dL (ref 0.40–1.50)
GFR: 105.4 mL/min (ref >60.00)
Glucose, Bld: 100 mg/dL — ABNORMAL HIGH (ref 70–99)
Potassium: 4.2 mEq/L (ref 3.5–5.1)
Sodium: 141 mEq/L (ref 135–145)

## 2015-07-22 LAB — AMMONIA: AMMONIA: 70 umol/L — AB (ref 11–35)

## 2015-07-24 ENCOUNTER — Encounter: Payer: Self-pay | Admitting: Internal Medicine

## 2015-07-24 ENCOUNTER — Ambulatory Visit (INDEPENDENT_AMBULATORY_CARE_PROVIDER_SITE_OTHER): Payer: Medicaid Other | Admitting: Internal Medicine

## 2015-07-24 VITALS — BP 130/80 | HR 68 | Ht 70.75 in | Wt 313.0 lb

## 2015-07-24 DIAGNOSIS — I8510 Secondary esophageal varices without bleeding: Secondary | ICD-10-CM | POA: Diagnosis not present

## 2015-07-24 DIAGNOSIS — K7290 Hepatic failure, unspecified without coma: Secondary | ICD-10-CM

## 2015-07-24 DIAGNOSIS — K7030 Alcoholic cirrhosis of liver without ascites: Secondary | ICD-10-CM

## 2015-07-24 DIAGNOSIS — R609 Edema, unspecified: Secondary | ICD-10-CM

## 2015-07-24 DIAGNOSIS — K7682 Hepatic encephalopathy: Secondary | ICD-10-CM

## 2015-07-24 MED ORDER — AMITRIPTYLINE HCL 100 MG PO TABS: 100.0000 mg | ORAL_TABLET | Freq: Every day | ORAL | Status: DC

## 2015-07-24 NOTE — Progress Notes (Signed)
Subjective:    Patient ID: [name], male    DOB: July 15, 1965, 50 y.o.   MRN: [id number] Chief complaint: Follow-up cirrhosis, edema encephalopathy HPI [name] is here today reporting that he is improved. He is restricting me in his diet some. I had him check labs prior to this visit and they're reflected below. He is not confused he has returned to the gym and says his low back pain is not bothering him so much after taking a month off from the gym at the [facility]. He did see Dr. Tommy Medal last month and was not doing so well he seemed a bit confused at that time. His ammonia was up quite a bit as well. Lower extremity edema is much better. He reports that he has been doing some physical therapy at the rehabilitation facility. Wt Readings from Last 3 Encounters:  07/24/15 313 lb (141.976 kg)  07/14/15 301 lb (136.533 kg)  06/19/15 309 lb (140.161 kg)   Allergies  Allergen Reactions  . Penicillins     REACTION: hives Has patient had a PCN reaction causing immediate rash, facial/tongue/throat swelling, SOB or lightheadedness with hypotension: Yes Has patient had a PCN reaction causing severe rash involving mucus membranes or skin necrosis: Yes Has patient had a PCN reaction that required hospitalization No Has patient had a PCN reaction occurring within the last 10 years: Yes If all of the above answers are "NO", then may proceed with Cephalosporin use.    Outpatient Prescriptions Prior to Visit  Medication Sig Dispense Refill  . acyclovir ointment (ZOVIRAX) 5 % APPLY SUFFICIENT AMOUNT TO AA 5 TIMES A DAY FOR 5 DAYS  1  . ALPRAZolam (XANAX) 1 MG tablet TAKE 1 TABLET BY MOUTH TWICE DAILY 60 tablet 2  . AMBULATORY NON FORMULARY MEDICATION Lift Chair- Use as needed Dx: generalized weakness, hepatic encephalopathy 1 each 0  . aMILoride (MIDAMOR) 5 MG tablet TAKE 3 TABLETS(15 MG) BY MOUTH DAILY 90 tablet 5  . amitriptyline (ELAVIL) 50 MG tablet Take 50 mg by mouth daily.  2  . Docusate  Calcium (STOOL SOFTENER PO) Take 3 tablets by mouth at bedtime.    . dolutegravir (TIVICAY) 50 MG tablet Take 1 tablet (50 mg total) by mouth daily. 30 tablet 11  . emtricitabine-tenofovir AF (DESCOVY) 200-25 MG per tablet Take 1 tablet by mouth daily. 30 tablet 11  . esomeprazole (NEXIUM) 40 MG capsule Take 1 capsule (40 mg total) by mouth daily before breakfast. 30 capsule 0  . furosemide (LASIX) 40 MG tablet Take 2 tablets (80 mg total) by mouth daily. (Patient taking differently: Take 40 mg by mouth 2 (two) times daily. ) 60 tablet 7  . Goldenseal 400 MG CAPS Take 1 capsule by mouth daily.    Marland Kitchen lactulose (CHRONULAC) 10 GM/15ML solution TAKE 60 ML BY MOUTH 4 TIMES DAILY. 7316 mL 11  . NEXIUM 40 MG capsule TAKE 1 CAPSULE BY MOUTH EVERY DAY BEFORE BREAKFAST 30 capsule 5  . PAZEO 0.7 % SOLN Place 1 drop into both eyes 2 (two) times daily.    [name] Glycol-Propyl Glycol (SYSTANE OP) Apply 1 drop to eye as needed (dry eyes).    . polyethylene glycol powder (MIRALAX) powder Take 17 g by mouth 2 (two) times daily. 850 g 11  . potassium chloride SA (K-DUR,KLOR-CON) 20 MEQ tablet TAKE 2 TABLETS BY MOUTH EVERY DAY (Patient taking differently: take 9meq twice daily) 60 tablet 3  . rifaximin (XIFAXAN) 550 MG TABS tablet  Take 1 tablet (550 mg total) by mouth 2 (two) times daily. 60 tablet 5  . senna (SENOKOT) 8.6 MG TABS tablet TAKE 2 TABLETS(17.2 MG) BY MOUTH DAILY. START WITH 1 TABLET 60 tablet 0  . sodium phosphate (FLEET) enema Place 1 enema rectally daily. Takes at night    . SSD 1 % cream Apply 1 application topically daily as needed (Fever blisters.).   0  . sulfamethoxazole-trimethoprim (BACTRIM DS,SEPTRA DS) 800-160 MG tablet Take 1 tablet by mouth 2 (two) times daily. 14 tablet 0  . triamcinolone cream (KENALOG) 0.1 % APP THIN LAYER EXT AA BID PRF ITCHING OR RASH  1  . vitamin E 400 UNIT capsule Take 400 Units by mouth daily.     No facility-administered medications prior to visit.   Past  Medical History  Diagnosis Date  . HIV DISEASE 06/23/2006  . HYPERLIPIDEMIA 06/23/2006  . ANXIETY 06/23/2006  . DEPRESSION 06/23/2006  . HYPERTENSION 06/23/2006  . 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 Ascites 11/13/2009  . STRAIN, CHEST WALL 03/15/2007  . Varices, esophageal ([place]) 06/2010  . Gastric ulcer 06/2010  . Alcoholic cirrhosis of liver ([place])   . Alcoholism, chronic ([place])   . Iron deficiency anemia   . GERD (gastroesophageal reflux disease)   . SBP (spontaneous bacterial peritonitis) ([place]) 05/03/2011    Suspected by high leukocytes on paracentesis. Clinical scenario also compatible. November 2012 responded to Levaquin. Started on trimethoprim-sulfamethoxazole double strength daily for prophylaxis.   . Obesity (BMI 30-39.9)     BMI 34 kg/m^2  . Left foot infection   . Ascites   . Portal hypertensive gastropathy 01/02/2013  . Blood dyscrasia     HIV  . Skin cancer     left calf  . 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 Avulsion fracture of middle phalanges of  3rd/4th fingers left hand 01/07/2014  . Erectile dysfunction 07/14/2015   Past Surgical History  Procedure Laterality Date  . Esophagogastroduodenoscopy w/ banding  06/26/2010    variceal ligation  . Carpal tunnel release      left  . Esophagogastroduodenoscopy  07/01/2010;  08/12/10    small varices, gastric ulcer  . Esophagogastroduodenoscopy  10/26/2011    Procedure: ESOPHAGOGASTRODUODENOSCOPY (EGD);  Surgeon: Gatha Mayer, MD;  Location: [facility];  Service: Endoscopy;  Laterality: N/A;  . Upper gastrointestinal endoscopy    . Colonoscopy  march 2013  . Esophagogastroduodenoscopy N/A 01/02/2013    Procedure: ESOPHAGOGASTRODUODENOSCOPY (EGD);  Surgeon: Gatha Mayer, MD;  Location: [facility];  Service: Endoscopy;  Laterality: N/A;  . Gastric varices banding N/A 01/02/2013    Procedure: GASTRIC VARICES  BANDING;  Surgeon: Gatha Mayer, MD;  Location: [facility];  Service: Endoscopy;  Laterality: N/A;  possible banding  . Esophagogastroduodenoscopy N/A 04/23/2013    Procedure: ESOPHAGOGASTRODUODENOSCOPY (EGD);  Surgeon: Gatha Mayer, MD;  Location: [facility];  Service: Endoscopy;  Laterality: N/A;  . Esophagogastroduodenoscopy N/A 05/01/2013    Procedure: ESOPHAGOGASTRODUODENOSCOPY (EGD);  Surgeon: Gatha Mayer, MD;  Location: [facility];  Service: Endoscopy;  Laterality: N/A;  follow-up varices and possibly band them  . Esophageal banding  05/01/2013    Procedure: ESOPHAGEAL BANDING;  Surgeon: Gatha Mayer, MD;  Location: [facility];  Service: Endoscopy;;  . Esophagogastroduodenoscopy N/A 09/04/2013    Procedure: ESOPHAGOGASTRODUODENOSCOPY (EGD);  Surgeon: Gatha Mayer, MD;  Location: [facility];  Service: Endoscopy;  Laterality: N/A;  .  Esophagogastroduodenoscopy N/A 09/10/2014    Procedure: ESOPHAGOGASTRODUODENOSCOPY (EGD);  Surgeon: Gatha Mayer, MD;  Location: [facility];  Service: Endoscopy;  Laterality: N/A;   Social History   Social History  . Marital Status: Single    Spouse Name: N/A  . Number of Children: N/A  . Years of Education: N/A   Occupational History  . disability    Social History [name] Topics  . Smoking status: Former Smoker -- 0.10 packs/day for 10 years    Types: Cigarettes    Start date: 03/03/2014    Quit date: 04/03/2014  . Smokeless tobacco: Never Used     Comment: form given 05-19-11  . Alcohol Use: No     Comment: pt is an alcoholic currently in [employer]  . Drug Use: No  . Sexual Activity:    Partners: Male    Birth Control/ Protection: Condom     Comment: pt. declined condoms   Other Topics Concern  . None   Social History Narrative   Single, disabled hair stylist   Lives with parents in a home with a basement.  Parents do not like for him to go down stairs because they are steep.              Family  History  Problem Relation Age of Onset  . Hyperlipidemia Mother   . Hypertension Mother   . Breast cancer Mother     questionable  . Heart disease Mother   . Hypertension Father   . Irritable bowel syndrome Father   . Drug abuse Brother   . Heart disease Brother   . Breast cancer Maternal Aunt     maternal great aunt  . Colon cancer Neg Hx   . Alcohol abuse Other   . Heart disease Maternal Uncle   . Irritable bowel syndrome Paternal Aunt         Review of Systems C/o Erectile dysfunction ? About testosterone or other Tx    Objective:   Physical Exam @BP  130/80 mmHg  Pulse 68  Ht 5' 10.75" (1.797 m)  Wt 313 lb (141.976 kg)  BMI 43.97 kg/m2@  General:  Well-developed, well-nourished and in no acute distress - obese Eyes:  anicteric. Lungs: Clear to auscultation bilaterally. Heart:  S1S2, no rubs, murmurs, gallops. Abdomen:  soft, non-tender, obese  Extremities:   trace edema bilat LE pretib/ankle, no cyanosis or clubbing Neuro:  A&O x 3. No asterixis Psych:  appropriate mood and  Affect.   Data Reviewed:   Chemistry      Component Value Date/Time   NA 141 07/22/2015 1140   K 4.2 07/22/2015 1140   CL 106 07/22/2015 1140   CO2 30 07/22/2015 1140   BUN 9 07/22/2015 1140   CREATININE 0.82 07/22/2015 1140   CREATININE 1.01 07/14/2015 1220      Component Value Date/Time   CALCIUM 9.3 07/22/2015 1140   ALKPHOS 106 07/14/2015 1220   AST 30 07/14/2015 1220   ALT 23 07/14/2015 1220   BILITOT 1.8* 07/14/2015 1220    Ammonia was 70 on February 7 .   Assessment & Plan:  Alcoholic cirrhosis of liver Stable to improved  Esophageal varices Time to reassess  Encephalopathy, hepatic better  Peripheral edema Much better - continue current Tx   Overall [name] seems to be doing pretty well today. I'm never quite certain why his ammonia level waxes and wanes. He is moving his bowels better at this point he says. I think were doing  the best we can in his  situation. He is coming due for a recheck of his varices and we'll do that next month at the hospital with an endoscopy.The risks and benefits as well as alternatives of endoscopic procedure(s) have been discussed and reviewed. All questions answered. The patient agrees to proceed.   After that I will set a follow-up for the office.  I'm not sure there is much they can be done for his erectile dysfunction questions and concerns, I told him I don't treat that. He can check with primary care but adding medications to his regimen could be tricky.  CC: Greig Right, MD Hollie Salk MD

## 2015-07-24 NOTE — Assessment & Plan Note (Signed)
better 

## 2015-07-24 NOTE — Assessment & Plan Note (Signed)
Much better - continue current Tx

## 2015-07-24 NOTE — Patient Instructions (Addendum)
  You have been scheduled for an endoscopy. Please follow written instructions given to you at your visit today. If you use inhalers (even only as needed), please bring them with you on the day of your procedure.  We have sent the following medications to your pharmacy for you to pick up at your convenience: Generic elavil   I appreciate the opportunity to care for you. Silvano Rusk, MD, [facility]

## 2015-07-24 NOTE — Assessment & Plan Note (Signed)
Time to reassess

## 2015-07-24 NOTE — Assessment & Plan Note (Signed)
Stable to improved.  

## 2015-07-28 ENCOUNTER — Other Ambulatory Visit: Payer: Self-pay | Admitting: Internal Medicine

## 2015-08-05 ENCOUNTER — Other Ambulatory Visit: Payer: Self-pay | Admitting: Infectious Disease

## 2015-08-05 DIAGNOSIS — B20 Human immunodeficiency virus [HIV] disease: Secondary | ICD-10-CM

## 2015-08-08 ENCOUNTER — Other Ambulatory Visit: Payer: Self-pay | Admitting: Internal Medicine

## 2015-08-14 ENCOUNTER — Telehealth: Payer: Self-pay | Admitting: Internal Medicine

## 2015-08-14 MED ORDER — FUROSEMIDE 40 MG PO TABS: 80.0000 mg | ORAL_TABLET | Freq: Two times a day (BID) | ORAL | Status: DC

## 2015-08-14 NOTE — Telephone Encounter (Signed)
Rx sent in as requested, left message informing mom Enid Derry.

## 2015-08-14 NOTE — Telephone Encounter (Signed)
Refill x 6 months 

## 2015-08-14 NOTE — Telephone Encounter (Signed)
They are requesting a refill on his generic lasix 40mg  and he takes 80mg  BID, ok to refill [name]?

## 2015-08-15 ENCOUNTER — Encounter (HOSPITAL_COMMUNITY): Payer: Self-pay | Admitting: *Deleted

## 2015-08-19 ENCOUNTER — Other Ambulatory Visit: Payer: Self-pay | Admitting: Internal Medicine

## 2015-08-19 ENCOUNTER — Telehealth: Payer: Self-pay | Admitting: Internal Medicine

## 2015-08-19 NOTE — Telephone Encounter (Signed)
Spoke with [name] and questions answered.

## 2015-08-21 ENCOUNTER — Ambulatory Visit (HOSPITAL_COMMUNITY): Payer: Medicaid Other | Admitting: Anesthesiology

## 2015-08-21 ENCOUNTER — Encounter (HOSPITAL_COMMUNITY): Payer: Self-pay

## 2015-08-21 ENCOUNTER — Ambulatory Visit (HOSPITAL_COMMUNITY)
Admission: RE | Admit: 2015-08-21 | Discharge: 2015-08-21 | Disposition: A | Discharge status: Home / Self Care | Payer: Medicaid Other | Source: Ambulatory Visit | Attending: Internal Medicine | Admitting: Internal Medicine

## 2015-08-21 ENCOUNTER — Encounter (HOSPITAL_COMMUNITY)
Admission: RE | Disposition: A | Discharge status: Home / Self Care | Payer: Self-pay | Source: Ambulatory Visit | Attending: Internal Medicine

## 2015-08-21 DIAGNOSIS — B20 Human immunodeficiency virus [HIV] disease: Secondary | ICD-10-CM | POA: Diagnosis not present

## 2015-08-21 DIAGNOSIS — K317 Polyp of stomach and duodenum: Secondary | ICD-10-CM | POA: Insufficient documentation

## 2015-08-21 DIAGNOSIS — K766 Portal hypertension: Secondary | ICD-10-CM | POA: Insufficient documentation

## 2015-08-21 DIAGNOSIS — I10 Essential (primary) hypertension: Secondary | ICD-10-CM | POA: Diagnosis not present

## 2015-08-21 DIAGNOSIS — K219 Gastro-esophageal reflux disease without esophagitis: Secondary | ICD-10-CM | POA: Insufficient documentation

## 2015-08-21 DIAGNOSIS — Z09 Encounter for follow-up examination after completed treatment for conditions other than malignant neoplasm: Secondary | ICD-10-CM | POA: Diagnosis present

## 2015-08-21 DIAGNOSIS — I8500 Esophageal varices without bleeding: Secondary | ICD-10-CM | POA: Insufficient documentation

## 2015-08-21 DIAGNOSIS — K7290 Hepatic failure, unspecified without coma: Secondary | ICD-10-CM | POA: Diagnosis not present

## 2015-08-21 DIAGNOSIS — K3189 Other diseases of stomach and duodenum: Secondary | ICD-10-CM | POA: Insufficient documentation

## 2015-08-21 DIAGNOSIS — Z87891 Personal history of nicotine dependence: Secondary | ICD-10-CM | POA: Insufficient documentation

## 2015-08-21 DIAGNOSIS — K7030 Alcoholic cirrhosis of liver without ascites: Secondary | ICD-10-CM | POA: Diagnosis not present

## 2015-08-21 DIAGNOSIS — Z79899 Other long term (current) drug therapy: Secondary | ICD-10-CM | POA: Diagnosis not present

## 2015-08-21 DIAGNOSIS — I8510 Secondary esophageal varices without bleeding: Secondary | ICD-10-CM | POA: Insufficient documentation

## 2015-08-21 HISTORY — PX: ESOPHAGOGASTRODUODENOSCOPY (EGD) WITH PROPOFOL: SHX5813

## 2015-08-21 LAB — GLUCOSE, CAPILLARY: Glucose-Capillary: 79 mg/dL (ref 65–99)

## 2015-08-21 SURGERY — ESOPHAGOGASTRODUODENOSCOPY (EGD) WITH PROPOFOL
Anesthesia: Monitor Anesthesia Care

## 2015-08-21 MED ORDER — SODIUM CHLORIDE 0.9 % IV SOLN: INTRAVENOUS | Status: DC

## 2015-08-21 MED ORDER — PROPOFOL 500 MG/50ML IV EMUL
INTRAVENOUS | Status: DC | PRN
  Administered 2015-08-21: 250 ug/kg/min via INTRAVENOUS

## 2015-08-21 MED ORDER — ONDANSETRON HCL 4 MG/2ML IJ SOLN
INTRAMUSCULAR | Status: AC
  Filled 2015-08-21: qty 2

## 2015-08-21 MED ORDER — ONDANSETRON HCL 4 MG/2ML IJ SOLN
INTRAMUSCULAR | Status: DC | PRN
  Administered 2015-08-21: 4 mg via INTRAVENOUS

## 2015-08-21 MED ORDER — PROPOFOL 10 MG/ML IV BOLUS
INTRAVENOUS | Status: AC
  Filled 2015-08-21: qty 40

## 2015-08-21 MED ORDER — LACTATED RINGERS IV SOLN
INTRAVENOUS | Status: DC
  Administered 2015-08-21: 10:00:00 via INTRAVENOUS

## 2015-08-21 SURGICAL SUPPLY — 15 items

## 2015-08-21 NOTE — Op Note (Signed)
[facility] Patient Name: [name] Procedure Date: 08/21/2015 MRN: [id number] Attending MD: Gatha Mayer , MD Date of Birth: 16-Nov-1965 CSN:  Age: 50 Admit Type: Outpatient Account #: [id number] Procedure:                Upper GI endoscopy Indications:              2nd degree variceal surveillance (following bleed                            and completed eradication) Providers:                Gatha Mayer, MD Referring MD:              Medicines:                Monitored Anesthesia Care Complications:            No immediate complications. Estimated Blood Loss:     Estimated blood loss: none. Procedure:                Pre-Anesthesia Assessment:                           - Prior to the procedure, a History and Physical                            was performed, and patient medications and                            allergies were reviewed. The patient's tolerance of                            previous anesthesia was also reviewed. The risks                            and benefits of the procedure and the sedation                            options and risks were discussed with the patient.                            All questions were answered, and informed consent                            was obtained. Prior Anticoagulants: The patient has                            taken no previous anticoagulant or antiplatelet                            agents. ASA Grade Assessment: III - A patient with                            severe systemic disease. After reviewing the risks  and benefits, the patient was deemed in                            satisfactory condition to undergo the procedure.                           After obtaining informed consent, the endoscope was                            passed under direct vision. Throughout the                            procedure, the patient's blood pressure, pulse, and                             oxygen saturations were monitored continuously. The                            [id number]) scope was introduced through the                            mouth, and advanced to the second part of duodenum.                            The upper GI endoscopy was accomplished with ease.                            The patient tolerated the procedure well. Scope In: Scope Out: Findings:      Three columns of non-bleeding small (< 5 mm) varices were found in the       lower third of the esophagus,. They were 2 mm in largest diameter. No       stigmata of recent bleeding were evident and no red wale signs were       present. The varices appeared unchanged in size from prior exam.      Mild portal hypertensive gastropathy was found in the gastric fundus and       in the gastric body.      A few sessile polyps with no bleeding and no stigmata of recent bleeding       were found in the prepyloric region of the stomach.      The exam was otherwise without abnormality. Impression:               - Non-bleeding small (< 5 mm) esophageal varices.                           - Portal hypertensive gastropathy.                           - A few gastric polyps. Inflammatory polyp chantges                            as seen in past                           -  The examination was otherwise normal.                           - No specimens collected. Moderate Sedation:      N/A- Per Anesthesia Care Recommendation:           - Patient has a contact number available for                            emergencies. The signs and symptoms of potential                            delayed complications were discussed with the                            patient. Return to normal activities tomorrow.                            Written discharge instructions were provided to the                            patient.                           - Resume previous diet.                           - Continue present  medications.                           - Return to my office in 4 months. Call in April or                            May to get appointment                           - Repeat upper endoscopy in 1 year for surveillance. Procedure Code(s):        --- Professional ---                           [phone], Esophagogastroduodenoscopy, flexible,                            transoral; diagnostic, including collection of                            specimen(s) by brushing or washing, when performed                            (separate procedure) Diagnosis Code(s):        --- Professional ---                           I85.00, Esophageal varices without bleeding                           K76.6, Portal hypertension  K31.89, Other diseases of stomach and duodenum                           K31.7, Polyp of stomach and duodenum CPT copyright 2016 American Medical Association. All rights reserved. The codes documented in this report are preliminary and upon coder review may  be revised to meet current compliance requirements. Gatha Mayer, MD Gatha Mayer, MD 08/21/2015 11:18:25 AM Number of Addenda: 0

## 2015-08-21 NOTE — Anesthesia Postprocedure Evaluation (Signed)
Anesthesia Post Note  Patient: [name]  Procedure(s) Performed: Procedure(s) (LRB): ESOPHAGOGASTRODUODENOSCOPY (EGD) WITH PROPOFOL (N/A)  Patient location during evaluation: PACU Anesthesia Type: MAC Level of consciousness: awake and alert Pain management: pain level controlled Vital Signs Assessment: post-procedure vital signs reviewed and stable Respiratory status: spontaneous breathing, nonlabored ventilation, respiratory function stable and patient connected to nasal cannula oxygen Cardiovascular status: blood pressure returned to baseline and stable Postop Assessment: no signs of nausea or vomiting Anesthetic complications: no    Last Vitals:  Filed Vitals:   08/21/15 1130 08/21/15 1140  BP: 122/69 113/62  Pulse:  66  Temp:    Resp: 14 15    Last Pain: There were no vitals filed for this visit.               Donelda Mailhot S

## 2015-08-21 NOTE — Transfer of Care (Signed)
Immediate Anesthesia Transfer of Care Note  Patient: [name]  Procedure(s) Performed: Procedure(s): ESOPHAGOGASTRODUODENOSCOPY (EGD) WITH PROPOFOL (N/A)  Patient Location: PACU  Anesthesia Type:MAC  Level of Consciousness:  sedated, patient cooperative and responds to stimulation  Airway & Oxygen Therapy:Patient Spontanous Breathing and Patient connected to face mask oxgen  Post-op Assessment:  Report given to PACU RN and Post -op Vital signs reviewed and stable  Post vital signs:  Reviewed and stable  Last Vitals:  Filed Vitals:   08/21/15 0937  BP: 132/68  Pulse: 68  Temp: 36.6 C  Resp: 25    Complications: No apparent anesthesia complications

## 2015-08-21 NOTE — Anesthesia Preprocedure Evaluation (Addendum)
Anesthesia Evaluation  Patient identified by MRN, date of birth, ID band Patient awake    Reviewed: Allergy & Precautions, NPO status , Patient's Chart, lab work & pertinent test results  Airway Mallampati: II  TM Distance: >3 FB Neck ROM: Full    Dental no notable dental hx.    Pulmonary neg pulmonary ROS, former smoker,    Pulmonary exam normal breath sounds clear to auscultation       Cardiovascular hypertension, Pt. on medications Normal cardiovascular exam Rhythm:Regular Rate:Normal     Neuro/Psych negative neurological ROS  negative psych ROS   GI/Hepatic negative GI ROS, (+) Cirrhosis   Esophageal Varices  substance abuse  alcohol use,   Endo/Other  negative endocrine ROS  Renal/GU negative Renal ROS  negative genitourinary   Musculoskeletal negative musculoskeletal ROS (+)   Abdominal   Peds negative pediatric ROS (+)  Hematology  (+) HIV,   Anesthesia Other Findings   Reproductive/Obstetrics negative OB ROS                            Anesthesia Physical Anesthesia Plan  ASA: III  Anesthesia Plan: MAC   Post-op Pain Management:    Induction: Intravenous  Airway Management Planned: Nasal Cannula  Additional Equipment:   Intra-op Plan:   Post-operative Plan:   Informed Consent: I have reviewed the patients History and Physical, chart, labs and discussed the procedure including the risks, benefits and alternatives for the proposed anesthesia with the patient or authorized representative who has indicated his/her understanding and acceptance.   Dental advisory given  Plan Discussed with: CRNA and Surgeon  Anesthesia Plan Comments:         Anesthesia Quick Evaluation

## 2015-08-21 NOTE — Discharge Instructions (Signed)

## 2015-08-21 NOTE — H&P (View-Only) (Signed)
Subjective:    Patient ID: [name], male    DOB: August 14, 1965, 50 y.o.   MRN: [id number] Chief complaint: Follow-up cirrhosis, edema encephalopathy HPI [name] is here today reporting that he is improved. He is restricting me in his diet some. I had him check labs prior to this visit and they're reflected below. He is not confused he has returned to the gym and says his low back pain is not bothering him so much after taking a month off from the gym at the [facility]. He did see Dr. Tommy Medal last month and was not doing so well he seemed a bit confused at that time. His ammonia was up quite a bit as well. Lower extremity edema is much better. He reports that he has been doing some physical therapy at the rehabilitation facility. Wt Readings from Last 3 Encounters:  07/24/15 313 lb (141.976 kg)  07/14/15 301 lb (136.533 kg)  06/19/15 309 lb (140.161 kg)   Allergies  Allergen Reactions  . Penicillins     REACTION: hives Has patient had a PCN reaction causing immediate rash, facial/tongue/throat swelling, SOB or lightheadedness with hypotension: Yes Has patient had a PCN reaction causing severe rash involving mucus membranes or skin necrosis: Yes Has patient had a PCN reaction that required hospitalization No Has patient had a PCN reaction occurring within the last 10 years: Yes If all of the above answers are "NO", then may proceed with Cephalosporin use.    Outpatient Prescriptions Prior to Visit  Medication Sig Dispense Refill  . acyclovir ointment (ZOVIRAX) 5 % APPLY SUFFICIENT AMOUNT TO AA 5 TIMES A DAY FOR 5 DAYS  1  . ALPRAZolam (XANAX) 1 MG tablet TAKE 1 TABLET BY MOUTH TWICE DAILY 60 tablet 2  . AMBULATORY NON FORMULARY MEDICATION Lift Chair- Use as needed Dx: generalized weakness, hepatic encephalopathy 1 each 0  . aMILoride (MIDAMOR) 5 MG tablet TAKE 3 TABLETS(15 MG) BY MOUTH DAILY 90 tablet 5  . amitriptyline (ELAVIL) 50 MG tablet Take 50 mg by mouth daily.  2  . Docusate  Calcium (STOOL SOFTENER PO) Take 3 tablets by mouth at bedtime.    . dolutegravir (TIVICAY) 50 MG tablet Take 1 tablet (50 mg total) by mouth daily. 30 tablet 11  . emtricitabine-tenofovir AF (DESCOVY) 200-25 MG per tablet Take 1 tablet by mouth daily. 30 tablet 11  . esomeprazole (NEXIUM) 40 MG capsule Take 1 capsule (40 mg total) by mouth daily before breakfast. 30 capsule 0  . furosemide (LASIX) 40 MG tablet Take 2 tablets (80 mg total) by mouth daily. (Patient taking differently: Take 40 mg by mouth 2 (two) times daily. ) 60 tablet 7  . Goldenseal 400 MG CAPS Take 1 capsule by mouth daily.    Marland Kitchen lactulose (CHRONULAC) 10 GM/15ML solution TAKE 60 ML BY MOUTH 4 TIMES DAILY. 7316 mL 11  . NEXIUM 40 MG capsule TAKE 1 CAPSULE BY MOUTH EVERY DAY BEFORE BREAKFAST 30 capsule 5  . PAZEO 0.7 % SOLN Place 1 drop into both eyes 2 (two) times daily.    [name] Glycol-Propyl Glycol (SYSTANE OP) Apply 1 drop to eye as needed (dry eyes).    . polyethylene glycol powder (MIRALAX) powder Take 17 g by mouth 2 (two) times daily. 850 g 11  . potassium chloride SA (K-DUR,KLOR-CON) 20 MEQ tablet TAKE 2 TABLETS BY MOUTH EVERY DAY (Patient taking differently: take 77meq twice daily) 60 tablet 3  . rifaximin (XIFAXAN) 550 MG TABS tablet  Take 1 tablet (550 mg total) by mouth 2 (two) times daily. 60 tablet 5  . senna (SENOKOT) 8.6 MG TABS tablet TAKE 2 TABLETS(17.2 MG) BY MOUTH DAILY. START WITH 1 TABLET 60 tablet 0  . sodium phosphate (FLEET) enema Place 1 enema rectally daily. Takes at night    . SSD 1 % cream Apply 1 application topically daily as needed (Fever blisters.).   0  . sulfamethoxazole-trimethoprim (BACTRIM DS,SEPTRA DS) 800-160 MG tablet Take 1 tablet by mouth 2 (two) times daily. 14 tablet 0  . triamcinolone cream (KENALOG) 0.1 % APP THIN LAYER EXT AA BID PRF ITCHING OR RASH  1  . vitamin E 400 UNIT capsule Take 400 Units by mouth daily.     No facility-administered medications prior to visit.   Past  Medical History  Diagnosis Date  . HIV DISEASE 06/23/2006  . HYPERLIPIDEMIA 06/23/2006  . ANXIETY 06/23/2006  . DEPRESSION 06/23/2006  . HYPERTENSION 06/23/2006  . 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 Ascites 11/13/2009  . STRAIN, CHEST WALL 03/15/2007  . Varices, esophageal ([place]) 06/2010  . Gastric ulcer 06/2010  . Alcoholic cirrhosis of liver ([place])   . Alcoholism, chronic ([place])   . Iron deficiency anemia   . GERD (gastroesophageal reflux disease)   . SBP (spontaneous bacterial peritonitis) ([place]) 05/03/2011    Suspected by high leukocytes on paracentesis. Clinical scenario also compatible. November 2012 responded to Levaquin. Started on trimethoprim-sulfamethoxazole double strength daily for prophylaxis.   . Obesity (BMI 30-39.9)     BMI 34 kg/m^2  . Left foot infection   . Ascites   . Portal hypertensive gastropathy 01/02/2013  . Blood dyscrasia     HIV  . Skin cancer     left calf  . 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 Avulsion fracture of middle phalanges of  3rd/4th fingers left hand 01/07/2014  . Erectile dysfunction 07/14/2015   Past Surgical History  Procedure Laterality Date  . Esophagogastroduodenoscopy w/ banding  06/26/2010    variceal ligation  . Carpal tunnel release      left  . Esophagogastroduodenoscopy  07/01/2010;  08/12/10    small varices, gastric ulcer  . Esophagogastroduodenoscopy  10/26/2011    Procedure: ESOPHAGOGASTRODUODENOSCOPY (EGD);  Surgeon: Gatha Mayer, MD;  Location: [facility];  Service: Endoscopy;  Laterality: N/A;  . Upper gastrointestinal endoscopy    . Colonoscopy  march 2013  . Esophagogastroduodenoscopy N/A 01/02/2013    Procedure: ESOPHAGOGASTRODUODENOSCOPY (EGD);  Surgeon: Gatha Mayer, MD;  Location: [facility];  Service: Endoscopy;  Laterality: N/A;  . Gastric varices banding N/A 01/02/2013    Procedure: GASTRIC VARICES  BANDING;  Surgeon: Gatha Mayer, MD;  Location: [facility];  Service: Endoscopy;  Laterality: N/A;  possible banding  . Esophagogastroduodenoscopy N/A 04/23/2013    Procedure: ESOPHAGOGASTRODUODENOSCOPY (EGD);  Surgeon: Gatha Mayer, MD;  Location: [facility];  Service: Endoscopy;  Laterality: N/A;  . Esophagogastroduodenoscopy N/A 05/01/2013    Procedure: ESOPHAGOGASTRODUODENOSCOPY (EGD);  Surgeon: Gatha Mayer, MD;  Location: [facility];  Service: Endoscopy;  Laterality: N/A;  follow-up varices and possibly band them  . Esophageal banding  05/01/2013    Procedure: ESOPHAGEAL BANDING;  Surgeon: Gatha Mayer, MD;  Location: [facility];  Service: Endoscopy;;  . Esophagogastroduodenoscopy N/A 09/04/2013    Procedure: ESOPHAGOGASTRODUODENOSCOPY (EGD);  Surgeon: Gatha Mayer, MD;  Location: [facility];  Service: Endoscopy;  Laterality: N/A;  .  Esophagogastroduodenoscopy N/A 09/10/2014    Procedure: ESOPHAGOGASTRODUODENOSCOPY (EGD);  Surgeon: Gatha Mayer, MD;  Location: [facility];  Service: Endoscopy;  Laterality: N/A;   Social History   Social History  . Marital Status: Single    Spouse Name: N/A  . Number of Children: N/A  . Years of Education: N/A   Occupational History  . disability    Social History [name] Topics  . Smoking status: Former Smoker -- 0.10 packs/day for 10 years    Types: Cigarettes    Start date: 03/03/2014    Quit date: 04/03/2014  . Smokeless tobacco: Never Used     Comment: form given 05-19-11  . Alcohol Use: No     Comment: pt is an alcoholic currently in [employer]  . Drug Use: No  . Sexual Activity:    Partners: Male    Birth Control/ Protection: Condom     Comment: pt. declined condoms   Other Topics Concern  . None   Social History Narrative   Single, disabled hair stylist   Lives with parents in a home with a basement.  Parents do not like for him to go down stairs because they are steep.              Family  History  Problem Relation Age of Onset  . Hyperlipidemia Mother   . Hypertension Mother   . Breast cancer Mother     questionable  . Heart disease Mother   . Hypertension Father   . Irritable bowel syndrome Father   . Drug abuse Brother   . Heart disease Brother   . Breast cancer Maternal Aunt     maternal great aunt  . Colon cancer Neg Hx   . Alcohol abuse Other   . Heart disease Maternal Uncle   . Irritable bowel syndrome Paternal Aunt         Review of Systems C/o Erectile dysfunction ? About testosterone or other Tx    Objective:   Physical Exam @BP  130/80 mmHg  Pulse 68  Ht 5' 10.75" (1.797 m)  Wt 313 lb (141.976 kg)  BMI 43.97 kg/m2@  General:  Well-developed, well-nourished and in no acute distress - obese Eyes:  anicteric. Lungs: Clear to auscultation bilaterally. Heart:  S1S2, no rubs, murmurs, gallops. Abdomen:  soft, non-tender, obese  Extremities:   trace edema bilat LE pretib/ankle, no cyanosis or clubbing Neuro:  A&O x 3. No asterixis Psych:  appropriate mood and  Affect.   Data Reviewed:   Chemistry      Component Value Date/Time   NA 141 07/22/2015 1140   K 4.2 07/22/2015 1140   CL 106 07/22/2015 1140   CO2 30 07/22/2015 1140   BUN 9 07/22/2015 1140   CREATININE 0.82 07/22/2015 1140   CREATININE 1.01 07/14/2015 1220      Component Value Date/Time   CALCIUM 9.3 07/22/2015 1140   ALKPHOS 106 07/14/2015 1220   AST 30 07/14/2015 1220   ALT 23 07/14/2015 1220   BILITOT 1.8* 07/14/2015 1220    Ammonia was 70 on February 7 .   Assessment & Plan:  Alcoholic cirrhosis of liver Stable to improved  Esophageal varices Time to reassess  Encephalopathy, hepatic better  Peripheral edema Much better - continue current Tx   Overall Rishad seems to be doing pretty well today. I'm never quite certain why his ammonia level waxes and wanes. He is moving his bowels better at this point he says. I think were doing  the best we can in his  situation. He is coming due for a recheck of his varices and we'll do that next month at the hospital with an endoscopy.The risks and benefits as well as alternatives of endoscopic procedure(s) have been discussed and reviewed. All questions answered. The patient agrees to proceed.   After that I will set a follow-up for the office.  I'm not sure there is much they can be done for his erectile dysfunction questions and concerns, I told him I don't treat that. He can check with primary care but adding medications to his regimen could be tricky.  CC: Greig Right, MD Hollie Salk MD

## 2015-08-21 NOTE — Interval H&P Note (Signed)
History and Physical Interval Note:  08/21/2015 9:50 AM  [name]  has presented today for surgery, with the diagnosis of cirrhoisis, esophageal varcies  The various methods of treatment have been discussed with the patient and family. After consideration of risks, benefits and other options for treatment, the patient has consented to  Procedure(s): ESOPHAGOGASTRODUODENOSCOPY (EGD) WITH PROPOFOL (N/A) as a surgical intervention .  The patient's history has been reviewed, patient examined, no change in status, stable for surgery.  I have reviewed the patient's chart and labs.  Questions were answered to the patient's satisfaction.     Silvano Rusk

## 2015-08-27 ENCOUNTER — Encounter (HOSPITAL_COMMUNITY): Payer: Self-pay | Admitting: Internal Medicine

## 2015-09-08 ENCOUNTER — Other Ambulatory Visit: Payer: Self-pay | Admitting: Internal Medicine

## 2015-09-11 ENCOUNTER — Telehealth: Payer: Self-pay | Admitting: Internal Medicine

## 2015-09-11 ENCOUNTER — Other Ambulatory Visit: Payer: Self-pay

## 2015-09-11 DIAGNOSIS — K7031 Alcoholic cirrhosis of liver with ascites: Secondary | ICD-10-CM

## 2015-09-11 DIAGNOSIS — K7290 Hepatic failure, unspecified without coma: Secondary | ICD-10-CM

## 2015-09-11 DIAGNOSIS — K7682 Hepatic encephalopathy: Secondary | ICD-10-CM

## 2015-09-11 NOTE — Telephone Encounter (Signed)
Mother states pt won't go to the ER. States they have to wait for 2 days there to have anything done. Wants to know if she can at least bring him to our lab to have blood drawn. Please advise.

## 2015-09-11 NOTE — Telephone Encounter (Signed)
He  can have an ammonia, CBC and CMET drawn  Dx: hepatic encephalopathy and cirrhosis

## 2015-09-11 NOTE — Telephone Encounter (Signed)
Pts mother called and states the thinks pts ammonia level is up again. States he is confused and his stomach is very swollen. States he is taking lactulose 53ml QID, xifaxan 1 pill bid, and lasix 80mg  bid. States he is not eating but about 1 meal a day and he is wanting to lose weight so he has stopped drinking water and drinks low sodium tomato juice all day. States he does not look good at all. Please advise.

## 2015-09-11 NOTE — Telephone Encounter (Signed)
Sounds like he needs to go to the ED for evaluation

## 2015-09-11 NOTE — Telephone Encounter (Signed)
Pts mother aware and orders in epic.

## 2015-09-12 ENCOUNTER — Other Ambulatory Visit (INDEPENDENT_AMBULATORY_CARE_PROVIDER_SITE_OTHER): Payer: Medicaid Other

## 2015-09-12 DIAGNOSIS — K7031 Alcoholic cirrhosis of liver with ascites: Secondary | ICD-10-CM | POA: Diagnosis not present

## 2015-09-12 DIAGNOSIS — K7290 Hepatic failure, unspecified without coma: Secondary | ICD-10-CM

## 2015-09-12 DIAGNOSIS — K7682 Hepatic encephalopathy: Secondary | ICD-10-CM

## 2015-09-12 LAB — CBC WITH DIFFERENTIAL/PLATELET
Basophils Absolute: 0 10*3/uL (ref 0.0–0.1)
Basophils Relative: 0.3 % (ref 0.0–3.0)
EOS ABS: 0.1 10*3/uL (ref 0.0–0.7)
EOS PCT: 2.2 % (ref 0.0–5.0)
HCT: 35.2 % — ABNORMAL LOW (ref 39.0–52.0)
Hemoglobin: 11.3 g/dL — ABNORMAL LOW (ref 13.0–17.0)
LYMPHS ABS: 0.8 10*3/uL (ref 0.7–4.0)
Lymphocytes Relative: 29.9 % (ref 12.0–46.0)
MCHC: 32.2 g/dL (ref 30.0–36.0)
MCV: 82.5 fl (ref 78.0–100.0)
MONO ABS: 0.3 10*3/uL (ref 0.1–1.0)
MONOS PCT: 10.4 % (ref 3.0–12.0)
NEUTROS PCT: 57.2 % (ref 43.0–77.0)
Neutro Abs: 1.6 10*3/uL (ref 1.4–7.7)
Platelets: 89 10*3/uL — ABNORMAL LOW (ref 150.0–400.0)
RBC: 4.26 Mil/uL (ref 4.22–5.81)
RDW: 18.7 % — AB (ref 11.5–15.5)
WBC: 2.8 10*3/uL — AB (ref 4.0–10.5)

## 2015-09-12 LAB — COMPREHENSIVE METABOLIC PANEL
ALT: 20 U/L (ref 0–53)
AST: 26 U/L (ref 0–37)
Albumin: 3.9 g/dL (ref 3.5–5.2)
Alkaline Phosphatase: 95 U/L (ref 39–117)
BUN: 12 mg/dL (ref 6–23)
CHLORIDE: 107 meq/L (ref 96–112)
CO2: 27 meq/L (ref 19–32)
Calcium: 9.3 mg/dL (ref 8.4–10.5)
Creatinine, Ser: 0.89 mg/dL (ref 0.40–1.50)
GFR: 95.84 mL/min (ref >60.00)
GLUCOSE: 93 mg/dL (ref 70–99)
Potassium: 3.9 mEq/L (ref 3.5–5.1)
SODIUM: 141 meq/L (ref 135–145)
Total Bilirubin: 1 mg/dL (ref 0.2–1.2)
Total Protein: 6.6 g/dL (ref 6.0–8.3)

## 2015-09-12 LAB — AMMONIA: Ammonia: 102 umol/L — ABNORMAL HIGH (ref 11–35)

## 2015-09-15 NOTE — Progress Notes (Signed)
Quick Note:  Ammonia is up - please get an update re: sxs and what he is taking etc ______

## 2015-09-16 ENCOUNTER — Telehealth: Payer: Self-pay | Admitting: *Deleted

## 2015-09-16 NOTE — Telephone Encounter (Signed)
Patient left message in triage asking for a call back ASAP.  RN returned the call, left message Johney Frame him to call back if he still needed anything. Landis Gandy, RN

## 2015-09-17 NOTE — Progress Notes (Signed)
Quick Note:  OK He is not on the books to see me but will need to get him on to be seen in next few weeks or see an APP otherwise  His liver could be getting worse vs an acute problem vs noncompliance    ______

## 2015-09-23 ENCOUNTER — Encounter: Payer: Self-pay | Admitting: Internal Medicine

## 2015-09-23 ENCOUNTER — Encounter (INDEPENDENT_AMBULATORY_CARE_PROVIDER_SITE_OTHER): Payer: Medicaid Other | Admitting: Gastroenterology

## 2015-09-23 ENCOUNTER — Other Ambulatory Visit: Payer: Medicaid Other

## 2015-09-23 ENCOUNTER — Encounter: Payer: Self-pay | Admitting: Gastroenterology

## 2015-09-23 ENCOUNTER — Ambulatory Visit (INDEPENDENT_AMBULATORY_CARE_PROVIDER_SITE_OTHER): Payer: Medicaid Other | Admitting: Internal Medicine

## 2015-09-23 VITALS — BP 128/76 | HR 88 | Ht 70.75 in | Wt 301.0 lb

## 2015-09-23 DIAGNOSIS — K7290 Hepatic failure, unspecified without coma: Secondary | ICD-10-CM | POA: Diagnosis not present

## 2015-09-23 DIAGNOSIS — F32A Depression, unspecified: Secondary | ICD-10-CM

## 2015-09-23 DIAGNOSIS — K7682 Hepatic encephalopathy: Secondary | ICD-10-CM

## 2015-09-23 DIAGNOSIS — K7031 Alcoholic cirrhosis of liver with ascites: Secondary | ICD-10-CM

## 2015-09-23 DIAGNOSIS — B20 Human immunodeficiency virus [HIV] disease: Secondary | ICD-10-CM

## 2015-09-23 DIAGNOSIS — F329 Major depressive disorder, single episode, unspecified: Secondary | ICD-10-CM

## 2015-09-23 DIAGNOSIS — J069 Acute upper respiratory infection, unspecified: Secondary | ICD-10-CM

## 2015-09-23 DIAGNOSIS — F1020 Alcohol dependence, uncomplicated: Secondary | ICD-10-CM

## 2015-09-23 LAB — CBC WITH DIFFERENTIAL/PLATELET
BASOS PCT: 1 %
Basophils Absolute: 38 cells/uL (ref 0–200)
Eosinophils Absolute: 76 cells/uL (ref 15–500)
Eosinophils Relative: 2 %
HCT: 36.4 % — ABNORMAL LOW (ref 38.5–50.0)
Hemoglobin: 11.6 g/dL — ABNORMAL LOW (ref 13.2–17.1)
LYMPHS ABS: 684 {cells}/uL — AB (ref 850–3900)
Lymphocytes Relative: 18 %
MCH: 26.2 pg — ABNORMAL LOW (ref 27.0–33.0)
MCHC: 31.9 g/dL — ABNORMAL LOW (ref 32.0–36.0)
MCV: 82.4 fL (ref 80.0–100.0)
MONOS PCT: 9 %
Monocytes Absolute: 342 cells/uL (ref 200–950)
NEUTROS PCT: 70 %
Neutro Abs: 2660 cells/uL (ref 1500–7800)
Platelets: 76 10*3/uL — ABNORMAL LOW (ref 140–400)
RBC: 4.42 MIL/uL (ref 4.20–5.80)
RDW: 17.9 % — ABNORMAL HIGH (ref 11.0–15.0)
WBC: 3.8 10*3/uL (ref 3.8–10.8)

## 2015-09-23 NOTE — Assessment & Plan Note (Signed)
Try to refer to psych Not actively homicidal now

## 2015-09-23 NOTE — Patient Instructions (Addendum)
  Dr Carlean Purl is going to investigate getting you in to see a Dr. For depression.   Follow up with [place] in 3 months or sooner if needed.    I appreciate the opportunity to care for you.

## 2015-09-23 NOTE — Assessment & Plan Note (Signed)
stable °

## 2015-09-24 ENCOUNTER — Encounter: Payer: Self-pay | Admitting: Internal Medicine

## 2015-09-24 LAB — COMPLETE METABOLIC PANEL WITH GFR
ALBUMIN: 4.1 g/dL (ref 3.6–5.1)
ALK PHOS: 109 U/L (ref 40–115)
ALT: 22 U/L (ref 9–46)
AST: 28 U/L (ref 10–35)
BILIRUBIN TOTAL: 1.3 mg/dL — AB (ref 0.2–1.2)
BUN: 11 mg/dL (ref 7–25)
CALCIUM: 8.8 mg/dL (ref 8.6–10.3)
CO2: 23 mmol/L (ref 20–31)
Chloride: 103 mmol/L (ref 98–110)
Creat: 0.89 mg/dL (ref 0.70–1.33)
GFR, Est African American: >89 mL/min (ref >=60)
GFR, Est Non African American: >89 mL/min (ref >=60)
Glucose, Bld: 89 mg/dL (ref 65–99)
Potassium: 3.8 mmol/L (ref 3.5–5.3)
SODIUM: 137 mmol/L (ref 135–146)
TOTAL PROTEIN: 6.6 g/dL (ref 6.1–8.1)

## 2015-09-24 NOTE — Progress Notes (Signed)
   Subjective:    Patient ID: [name], male    DOB: June 12, 1966, 50 y.o.   MRN: [id number] Cc: recent lethargy, illness, depressed HPI Last week labs were drawn because mom called and said [name] was sleeping a lot and "not doing well". Turns out he had URI sxs, myalgias, etc. Slowly improved but still has sore throat and post nasal drip at night. Had seen PCP and cefidnir was Rx but caused bad diarrhea so stopped.  Also went to a party with some old friends - EtOH was there - [name] had one drink - very strong - did not feel well - no more alcohol. The experience upset him - he did go right to an [place] meeting and has not had other alcohol. He says he became depressed and has wished he were dead and even thought about how a bottle of acetaminophen could cause death. Did not act on this and tells me today that e has no plans to hurt himself or others.  Actually doing better overall except for the sore throat at night. He is gargling with salt water and H2O2.  Medications, allergies, past medical history, past surgical history, family history and social history are reviewed and updated in the EMR.   Review of Systems As above    Objective:   Physical Exam BP 128/76 mmHg  Pulse 88  Ht 5' 10.75" (1.797 m)  Wt 301 lb (136.533 kg)  BMI 42.28 kg/m2 Obese, NAD He is alert and oriented x 3 and no asterixis - speech very clear Lungs CTA Pharynx - mild erythema No cervical or [place] adenopathy Ht s1 s2 no murmur abd obses soft and NT Ext no sig edema  Data:  Ammonia 70 on 07/22/15 and 102 on 09/12/15 LFT NL Hgb 11.6 WBC 3.8 PLT 76 Wt Readings from Last 3 Encounters:  09/23/15 301 lb (136.533 kg)  09/23/15 301 lb 6.4 oz (136.714 kg)  08/21/15 313 lb (141.976 kg)      Assessment & Plan:   Encounter Diagnoses  Name Primary?  . Encephalopathy, hepatic ([place]) Yes  . Alcoholic cirrhosis of liver with ascites ([place])   . Depression   . Viral URI   . Alcoholism ([place])    Alcoholic  cirrhosis of liver stable  Depression Try to refer to psych Not actively homicidal now   Today other than URI seems ok  He is getting back on track w/ EtOH - this was first relapse in long time and short-lived. i believe the NH3 102 was from illness and he is so clear today will not recheck. He is moving bowels ok for him - never has freq loose stools despite laxatives and lactulose.  Cannot find psych provider that takes Medicaid. May not be possible to get that.  See me 3 mos sooner prn  [id number], MD Hollie Salk, MD

## 2015-09-24 NOTE — Progress Notes (Signed)
This encounter was created in error - please disregard.

## 2015-09-25 ENCOUNTER — Telehealth: Payer: Self-pay | Admitting: Internal Medicine

## 2015-09-25 LAB — T-HELPER CELL (CD4) - (RCID CLINIC ONLY)
CD4 T CELL HELPER: 26 % — AB (ref 33–55)
CD4 T Cell Abs: 190 /uL — ABNORMAL LOW (ref 400–2700)

## 2015-09-25 LAB — HIV-1 RNA QUANT-NO REFLEX-BLD
HIV 1 RNA Quant: <20 copies/mL (ref <20)
HIV-1 RNA Quant, Log: <1.3 Log copies/mL (ref <1.30)

## 2015-09-25 NOTE — Telephone Encounter (Signed)
Left message for Karas that I've not heard back from Dr Carlean Purl yet.

## 2015-09-25 NOTE — Telephone Encounter (Signed)
[name] needs a refill on his xanax, says he only has 3 left.  Says he doesn't know what happened to the them.  Mom says she thinks its due on 10/06/15 to be refilled.  Please advise.

## 2015-09-26 ENCOUNTER — Other Ambulatory Visit: Payer: Self-pay | Admitting: Internal Medicine

## 2015-09-26 NOTE — Telephone Encounter (Signed)
Please advise [name]? 

## 2015-09-29 ENCOUNTER — Telehealth: Payer: Self-pay | Admitting: *Deleted

## 2015-09-29 NOTE — Telephone Encounter (Signed)
Patient left message in triage asking to have his labs sent to his doctor at [facility], whom he is seeing tomorrow.  RN unable to understand patient's dr's name.  Left message asking him to please call back and clarify. Landis Gandy, RN

## 2015-09-29 NOTE — Telephone Encounter (Signed)
No early refills on alprazolam

## 2015-09-30 ENCOUNTER — Other Ambulatory Visit: Payer: Self-pay | Admitting: Internal Medicine

## 2015-09-30 NOTE — Telephone Encounter (Signed)
Left message that we can't refill the xanax early.

## 2015-10-01 NOTE — Telephone Encounter (Signed)
Have him see Grayland Ormond as well

## 2015-10-01 NOTE — Telephone Encounter (Signed)
Patient called back, requested his recent labs be sent to Dr. Neysa Hotter at [facility]. Patient asked if he could come in tomorrow at 8am for additional lab work ordered by Dr. Luetta Nutting. RN informed patient that he should contact Dr. Luetta Nutting' office for lab work, that we don't have lab orders and do not usually draw labs for other physicians, especially since Dr. Lucianne Lei Dam's labs were drawn 4/11.   Pt will call Dr. Luetta Nutting' office for clarification. Of note, patient states he is experiencing increased stress and anxiety due to his mother's reaction to his appointment with the plastic surgeon to address gynecomastia. He states that she has been yelling at him about making this appointment without her knowledge and he knows she does not support it.  He said she thinks it's a major surgery, but he thinks it's "no big deal" and wants to go through with it.  Patient states he knows Dr. Carlean Purl is managing the xanax prescription, Dr. Tommy Medal will not be filling this.  He is out (or almost out), and is aware Dr. Carlean Purl will not fill this early. Landis Gandy, RN

## 2015-10-02 ENCOUNTER — Telehealth: Payer: Self-pay | Admitting: Internal Medicine

## 2015-10-02 MED ORDER — ALPRAZOLAM 1 MG PO TABS: 1.0000 mg | ORAL_TABLET | Freq: Two times a day (BID) | ORAL | Status: DC

## 2015-10-02 NOTE — Telephone Encounter (Signed)
Please advise [name], thank you. 

## 2015-10-02 NOTE — Telephone Encounter (Signed)
Informed Enid Derry that I was faxing [name]'s xanax to [facility] in [place] as requested.

## 2015-10-02 NOTE — Telephone Encounter (Signed)
Ok to refill x 1 w/ 3 RF

## 2015-10-03 ENCOUNTER — Other Ambulatory Visit: Payer: Self-pay | Admitting: Internal Medicine

## 2015-10-03 NOTE — Telephone Encounter (Signed)
May I refill [name]? 

## 2015-10-03 NOTE — Telephone Encounter (Signed)
Refill x6

## 2015-10-07 NOTE — Telephone Encounter (Signed)
Scheduled with [name] for 5/2.  He is now considering cool laser sculpting for gynecomasty rather than surgery.

## 2015-10-07 NOTE — Telephone Encounter (Signed)
Ok very good. I personally dont think pursuing surgery is the smartest option at present> i am much more worried about his liver health

## 2015-10-14 ENCOUNTER — Ambulatory Visit: Payer: Medicaid Other

## 2015-10-20 ENCOUNTER — Other Ambulatory Visit: Payer: Self-pay

## 2015-10-22 ENCOUNTER — Telehealth: Payer: Self-pay | Admitting: *Deleted

## 2015-10-22 NOTE — Telephone Encounter (Signed)
Patient called needing to speak with Clifton Custard as she already has been helping him and knows what is going on. He wanted me to have her call him advised will be tomorrow 10/23/15, as she is out of the office today.

## 2015-10-23 ENCOUNTER — Other Ambulatory Visit: Payer: Self-pay | Admitting: Internal Medicine

## 2015-10-23 NOTE — Telephone Encounter (Signed)
Attempted to return patient's call 3 times. Phone was busy, unable to leave message.

## 2015-10-29 ENCOUNTER — Ambulatory Visit: Payer: Medicaid Other

## 2015-10-29 DIAGNOSIS — F4323 Adjustment disorder with mixed anxiety and depressed mood: Secondary | ICD-10-CM

## 2015-10-29 NOTE — BH Specialist Note (Signed)
I met with [name] for the first time today and he presents as a 50 year old obese male, diagnosed with HIV in 48.  He has been on disability for 3 years due to multiple medical issues.  He has cirrhosis of the liver from alcohol abuse and a DWI in 2011.  He is living with his elderly parents and relies on them for transportation.  He has some depression and anxiety but I would not say that he is clinically depressed. He says he goes to the gym 4-5 times a week and used to be a Counselling psychologist".  He also used to be a hair stylist.  He said he is frustrated with his life right now and needs someone to talk to about it.   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 Curley Spice, LCSW

## 2015-11-03 ENCOUNTER — Encounter: Payer: Self-pay | Admitting: Infectious Disease

## 2015-11-03 ENCOUNTER — Ambulatory Visit (INDEPENDENT_AMBULATORY_CARE_PROVIDER_SITE_OTHER): Payer: Medicaid Other | Admitting: Infectious Disease

## 2015-11-03 ENCOUNTER — Ambulatory Visit: Payer: Self-pay | Admitting: Infectious Disease

## 2015-11-03 ENCOUNTER — Ambulatory Visit: Payer: Medicaid Other | Admitting: *Deleted

## 2015-11-03 VITALS — BP 128/79 | HR 80 | Temp 97.8°F | Wt 294.0 lb

## 2015-11-03 DIAGNOSIS — B20 Human immunodeficiency virus [HIV] disease: Secondary | ICD-10-CM

## 2015-11-03 DIAGNOSIS — F329 Major depressive disorder, single episode, unspecified: Secondary | ICD-10-CM | POA: Diagnosis not present

## 2015-11-03 DIAGNOSIS — I8510 Secondary esophageal varices without bleeding: Secondary | ICD-10-CM

## 2015-11-03 DIAGNOSIS — K7031 Alcoholic cirrhosis of liver with ascites: Secondary | ICD-10-CM | POA: Diagnosis not present

## 2015-11-03 DIAGNOSIS — F411 Generalized anxiety disorder: Secondary | ICD-10-CM

## 2015-11-03 DIAGNOSIS — N528 Other male erectile dysfunction: Secondary | ICD-10-CM

## 2015-11-03 DIAGNOSIS — K7682 Hepatic encephalopathy: Secondary | ICD-10-CM

## 2015-11-03 DIAGNOSIS — G4700 Insomnia, unspecified: Secondary | ICD-10-CM | POA: Diagnosis not present

## 2015-11-03 DIAGNOSIS — K7290 Hepatic failure, unspecified without coma: Secondary | ICD-10-CM | POA: Diagnosis not present

## 2015-11-03 DIAGNOSIS — F32A Depression, unspecified: Secondary | ICD-10-CM

## 2015-11-03 DIAGNOSIS — K3189 Other diseases of stomach and duodenum: Secondary | ICD-10-CM

## 2015-11-03 DIAGNOSIS — K766 Portal hypertension: Secondary | ICD-10-CM

## 2015-11-03 MED ORDER — SILDENAFIL CITRATE 25 MG PO TABS: 25.0000 mg | ORAL_TABLET | Freq: Every day | ORAL | Status: DC | PRN

## 2015-11-03 NOTE — BH Specialist Note (Signed)
Counselor met with Korby today in the exam room for a warm hand off.  Patient was oriented times four with sleepy affect but appropriate dress.  Patient was closing his eyes constantly and indicating that he was sleepy.  Patient stated that he needed plastic surgery to repair his stomach.  Counselor inquired about his substance use to which he said everything was fine and in control.  Patient denied having any type of problem and was close to getting his licenses back soon. Counselor encouraged patient to seek counseling services to process the issues in his life and develop better coping skills to avoid any medication prescribed or not.   Rolena Infante, MA, LPC [facility]

## 2015-11-03 NOTE — Progress Notes (Signed)
Chief complaint: having trouble with fatigue, also suffering from ED Subjective:    Patient ID: [name], male    DOB: 1966-03-18, 50 y.o.   MRN: [id number]  Chief complaint: lower extremity venous stasis changes, and Erectile dysfunction, obesity  HPI   [name] is a 50 y.o. male with HIV infection, alcoholic cirrhosis,  who had been  doing superbly well on his antiviral regimen ofTivicay and nd Descovy and his VL remains suppressed and CD4 is above hovering near 200  Lab Results  Component Value Date   HIV1RNAQUANT <20 09/23/2015   HIV1RNAQUANT <20 07/14/2015   HIV1RNAQUANT 27* 04/21/2015     Lab Results  Component Value Date   CD4TABS 64* 09/23/2015   CD4TABS 190* 07/14/2015   CD4TABS 210* 04/21/2015     He HAS been following with Dr. Carlean Purl for his alcoholic liver disease, portal htn, esophageal varices and hepatic encephalopathy.  He apparently succumbed to Etoh mixed drinks held by a party of one of my other patients and he was assured there was "only a little bit of alcohol" in the drinks. He claims that he has not drank beyond that episode though it was enough to worsen his encephalopathy  He continues to suffer from anxiety and depression re body image and wants to have "cool sculpting done"  Yet again  after I had thought we were done with his visit he came to my work pod and told me again about problems with ED and requested cialis vs viagra.   Past Medical History  Diagnosis Date  . HIV DISEASE 06/23/2006  . HYPERLIPIDEMIA 06/23/2006  . ANXIETY 06/23/2006  . DEPRESSION 06/23/2006  . HYPERTENSION 06/23/2006  . 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 Ascites 11/13/2009  . STRAIN, CHEST WALL 03/15/2007  . Varices, esophageal ([place]) 06/2010  . Gastric ulcer 06/2010  . Alcoholic cirrhosis of liver ([place])   . Alcoholism, chronic ([place])   . Iron deficiency anemia   . GERD  (gastroesophageal reflux disease)   . SBP (spontaneous bacterial peritonitis) ([place]) 05/03/2011    Suspected by high leukocytes on paracentesis. Clinical scenario also compatible. November 2012 responded to Levaquin. Started on trimethoprim-sulfamethoxazole double strength daily for prophylaxis.   . Obesity (BMI 30-39.9)     BMI 34 kg/m^2  . Left foot infection   . Ascites   . Portal hypertensive gastropathy 01/02/2013  . Blood dyscrasia     HIV  . Skin cancer     left calf  . 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 Avulsion fracture of middle phalanges of  3rd/4th fingers left hand 01/07/2014  . Erectile dysfunction 07/14/2015    Past Surgical History  Procedure Laterality Date  . Esophagogastroduodenoscopy w/ banding  06/26/2010    variceal ligation  . Carpal tunnel release      left  . Esophagogastroduodenoscopy  07/01/2010;  08/12/10    small varices, gastric ulcer  . Esophagogastroduodenoscopy  10/26/2011    Procedure: ESOPHAGOGASTRODUODENOSCOPY (EGD);  Surgeon: Gatha Mayer, MD;  Location: [facility];  Service: Endoscopy;  Laterality: N/A;  . Upper gastrointestinal endoscopy    . Colonoscopy  march 2013  . Esophagogastroduodenoscopy N/A 01/02/2013    Procedure: ESOPHAGOGASTRODUODENOSCOPY (EGD);  Surgeon: Gatha Mayer, MD;  Location: [facility];  Service: Endoscopy;  Laterality: N/A;  . Gastric varices banding N/A 01/02/2013    Procedure: GASTRIC VARICES BANDING;  Surgeon: Gatha Mayer, MD;  Location: [facility];  Service: Endoscopy;  Laterality: N/A;  possible banding  . Esophagogastroduodenoscopy N/A 04/23/2013    Procedure: ESOPHAGOGASTRODUODENOSCOPY (EGD);  Surgeon: Gatha Mayer, MD;  Location: [facility];  Service: Endoscopy;  Laterality: N/A;  . Esophagogastroduodenoscopy N/A 05/01/2013    Procedure: ESOPHAGOGASTRODUODENOSCOPY (EGD);  Surgeon: Gatha Mayer, MD;  Location: [facility];  Service: Endoscopy;  Laterality: N/A;  follow-up  varices and possibly band them  . Esophageal banding  05/01/2013    Procedure: ESOPHAGEAL BANDING;  Surgeon: Gatha Mayer, MD;  Location: [facility];  Service: Endoscopy;;  . Esophagogastroduodenoscopy N/A 09/04/2013    Procedure: ESOPHAGOGASTRODUODENOSCOPY (EGD);  Surgeon: Gatha Mayer, MD;  Location: [facility];  Service: Endoscopy;  Laterality: N/A;  . Esophagogastroduodenoscopy N/A 09/10/2014    Procedure: ESOPHAGOGASTRODUODENOSCOPY (EGD);  Surgeon: Gatha Mayer, MD;  Location: [facility];  Service: Endoscopy;  Laterality: N/A;  . Esophagogastroduodenoscopy (egd) with propofol N/A 08/21/2015    Procedure: ESOPHAGOGASTRODUODENOSCOPY (EGD) WITH PROPOFOL;  Surgeon: Gatha Mayer, MD;  Location: [facility];  Service: Endoscopy;  Laterality: N/A;    Family History  Problem Relation Age of Onset  . Hyperlipidemia Mother   . Hypertension Mother   . Breast cancer Mother     questionable  . Heart disease Mother   . Hypertension Father   . Irritable bowel syndrome Father   . Drug abuse Brother   . Heart disease Brother   . Breast cancer Maternal Aunt     maternal great aunt  . Colon cancer Neg Hx   . Alcohol abuse Other   . Heart disease Maternal Uncle   . Irritable bowel syndrome Paternal Aunt       Social History   Social History  . Marital Status: Single    Spouse Name: N/A  . Number of Children: N/A  . Years of Education: N/A   Occupational History  . disability    Social History Main Topics  . Smoking status: Former Smoker -- 0.10 packs/day for 10 years    Types: Cigarettes    Start date: 03/03/2014    Quit date: 04/03/2014  . Smokeless tobacco: Never Used     Comment: form given 05-19-11  . Alcohol Use: No     Comment: pt is an alcoholic currently in [employer]  . Drug Use: No  . Sexual Activity:    Partners: Male    Birth Control/ Protection: Condom     Comment: pt. declined condoms   Other Topics Concern  . None   Social History  Narrative   Single, disabled hair stylist   Lives with parents in a home with a basement.  Parents do not like for him to go down stairs because they are steep.               Allergies  Allergen Reactions  . Penicillins     REACTION: hives Has patient had a PCN reaction causing immediate rash, facial/tongue/throat swelling, SOB or lightheadedness with hypotension: Yes Has patient had a PCN reaction causing severe rash involving mucus membranes or skin necrosis: Yes Has patient had a PCN reaction that required hospitalization No Has patient had a PCN reaction occurring within the last 10 years: Yes If all of the above answers are "NO", then may proceed with Cephalosporin use.   . Sulfa Antibiotics      Current outpatient prescriptions:  .  ALPRAZolam (XANAX) 1 MG tablet, Take 1 tablet (1 mg total) by mouth 2 (two) times  daily., Disp: 60 tablet, Rfl: 3 .  AMBULATORY NON FORMULARY MEDICATION, Lift Chair- Use as needed Dx: generalized weakness, hepatic encephalopathy, Disp: 1 each, Rfl: 0 .  aMILoride (MIDAMOR) 5 MG tablet, Take 5 mg by mouth daily., Disp: , Rfl:  .  amitriptyline (ELAVIL) 100 MG tablet, Take 1 tablet (100 mg total) by mouth at bedtime., Disp: 30 tablet, Rfl: 11 .  emtricitabine-tenofovir AF (DESCOVY) 200-25 MG per tablet, Take 1 tablet by mouth daily. (Patient taking differently: Take 1 tablet by mouth daily. ), Disp: 30 tablet, Rfl: 11 .  esomeprazole (NEXIUM) 40 MG capsule, Take 1 capsule (40 mg total) by mouth daily before breakfast., Disp: 30 capsule, Rfl: 0 .  furosemide (LASIX) 40 MG tablet, Take 2 tablets (80 mg total) by mouth 2 (two) times daily., Disp: 120 tablet, Rfl: 5 .  lactulose (CHRONULAC) 10 GM/15ML solution, TAKE 60 ML BY MOUTH 4 TIMES DAILY., Disp: 7316 mL, Rfl: 11 .  NEXIUM 40 MG capsule, TAKE 1 CAPSULE BY MOUTH EVERY DAY BEFORE BREAKFAST, Disp: 30 capsule, Rfl: 5 .  Polyethyl Glycol-Propyl Glycol (SYSTANE OP), Apply 1 drop to eye as needed (dry  eyes)., Disp: , Rfl:  .  polyethylene glycol powder (GLYCOLAX/MIRALAX) powder, DISSOLVE 1 CAPFUL IN WATER TWICE A DAY AS DIRECTED, Disp: 1054 g, Rfl: 0 .  potassium chloride SA (K-DUR,KLOR-CON) 20 MEQ tablet, TAKE 2 TABLETS BY MOUTH EVERY DAY, Disp: 60 tablet, Rfl: 5 .  rifaximin (XIFAXAN) 550 MG TABS tablet, Take 1 tablet (550 mg total) by mouth 2 (two) times daily., Disp: 60 tablet, Rfl: 5 .  senna (SENOKOT) 8.6 MG TABS tablet, TAKE 2 TABLETS(17.2 MG) BY MOUTH DAILY. START WITH 1 TABLET (Patient taking differently: TAKE 2 TABLETS(17.2 MG) BY MOUTH DAILY.), Disp: 60 tablet, Rfl: 0 .  sodium phosphate (FLEET) enema, Place 1 enema rectally daily. at night, Disp: , Rfl:  .  SSD 1 % cream, Apply 1 application topically daily as needed (Fever blisters.). , Disp: , Rfl: 0 .  TIVICAY 50 MG tablet, TAKE 1 TABLET BY MOUTH ONCE A DAY, Disp: 30 tablet, Rfl: 11 .  triamcinolone cream (KENALOG) 0.1 %, APPLY TO AFFECTED AREA DAILY AS NEEDED FOR ITCHING OR RASH, Disp: , Rfl: 1 .  sildenafil (VIAGRA) 25 MG tablet, Take 1 tablet (25 mg total) by mouth daily as needed for erectile dysfunction., Disp: 10 tablet, Rfl: 4   Review of Systems  Constitutional: Negative for fever, chills, diaphoresis and unexpected weight change.  HENT: Negative for congestion, rhinorrhea, sinus pressure, sneezing, sore throat and trouble swallowing.   Eyes: Negative for photophobia and visual disturbance.  Respiratory: Negative for cough, chest tightness, shortness of breath, wheezing and stridor.   Cardiovascular: Negative for chest pain, palpitations and leg swelling.  Gastrointestinal: Negative for nausea, vomiting, abdominal pain, diarrhea, constipation, blood in stool, abdominal distention and anal bleeding.  Genitourinary: Negative for dysuria, hematuria, flank pain and difficulty urinating.  Musculoskeletal: Positive for gait problem. Negative for myalgias, back pain, joint swelling and arthralgias.  Skin: Negative for color  change, pallor, rash and wound.  Neurological: Positive for weakness. Negative for dizziness, tremors, seizures and light-headedness.  Hematological: Negative for adenopathy. Does not bruise/bleed easily.  Psychiatric/Behavioral: Positive for confusion and sleep disturbance. Negative for behavioral problems, dysphoric mood, decreased concentration and agitation. The patient is not nervous/anxious.        Objective:   Physical Exam  Constitutional: He is oriented to person, place, and time. He appears well-developed and well-nourished. No  distress.  HENT:  Head: Normocephalic and atraumatic.  Mouth/Throat: Oropharynx is clear and moist. No oropharyngeal exudate.  Eyes: Conjunctivae and EOM are normal. Pupils are equal, round, and reactive to light. No scleral icterus.  Neck: Normal range of motion. Neck supple. No JVD present.  Cardiovascular: Normal rate and regular rhythm.   Pulmonary/Chest: Effort normal. No respiratory distress. He has no wheezes.  Abdominal: Soft. He exhibits distension. There is no tenderness.  Musculoskeletal: He exhibits no edema or tenderness.  Lymphadenopathy:    He has no cervical adenopathy.  Neurological: He is alert and oriented to person, place, and time. No sensory deficit. He exhibits normal muscle tone. Gait normal.  Skin: Skin is warm and dry. He is not diaphoretic. No erythema. No pallor.  Psychiatric: He has a normal mood and affect. Thought content normal. His speech is delayed. He is slowed.        Assessment & Plan:   HIV: continue  TIVICAY and DESCOVy for better bone and kidney health. He is to take in the am to reduce effect of insomnia from INSTI   Insomnia: see above  Depression and problems with body image and anxiety: he is to continue meeting with Leveda Anna and [facility] Ormond. He is on benzodiazepene from Dr. Carlean Purl.    Etoh induced cirrhosis with varices: followed by [facility], Dr. Carlean Purl and [facility] team. Hopefully he is being compliant  with meds  Hepatic encephalopathy: continues to be fairly slowed during visits with him.    ED: still need to check testosterone. I DID give him rx for viagra but I am worried about the risk for fall due to dizziness that could occur with this medicine. I have WARNED him about this risk and he understand and accepts it  I spent greater than 40 minutes with the patient including greater than 50% of time in face to face counsel of the patient re his HIV, Etoh induced cirrhosis, , hepatic encephalopathy, neuropathy, anxiety, ED and in coordination of his care.  I would like for him to start going to PCP regularly

## 2015-11-05 ENCOUNTER — Ambulatory Visit: Payer: Self-pay | Admitting: Infectious Disease

## 2015-11-12 ENCOUNTER — Ambulatory Visit: Payer: Medicaid Other

## 2015-12-06 ENCOUNTER — Other Ambulatory Visit: Payer: Self-pay | Admitting: Internal Medicine

## 2015-12-18 ENCOUNTER — Other Ambulatory Visit: Payer: Self-pay | Admitting: Internal Medicine

## 2015-12-24 ENCOUNTER — Telehealth: Payer: Self-pay | Admitting: Internal Medicine

## 2015-12-24 NOTE — Telephone Encounter (Signed)
Left message for patient to call back  

## 2015-12-25 NOTE — Telephone Encounter (Signed)
Left message for patient to call back  

## 2015-12-26 NOTE — Telephone Encounter (Signed)
noted 

## 2015-12-26 NOTE — Telephone Encounter (Signed)
No return calls from the patient and he is not answering today.  Dr. Lorelei Pont

## 2015-12-29 ENCOUNTER — Other Ambulatory Visit: Payer: Self-pay | Admitting: Internal Medicine

## 2015-12-29 NOTE — Telephone Encounter (Signed)
Ok to refill [name]? 

## 2015-12-29 NOTE — Telephone Encounter (Signed)
Ok x 6 

## 2016-01-02 ENCOUNTER — Other Ambulatory Visit: Payer: Self-pay | Admitting: Internal Medicine

## 2016-01-07 ENCOUNTER — Telehealth: Payer: Self-pay | Admitting: Internal Medicine

## 2016-01-07 NOTE — Telephone Encounter (Signed)
Left message for patient to call back  

## 2016-01-08 NOTE — Telephone Encounter (Signed)
Patient's mother called to discuss [name].  He has "some spots on his face the skin doctor won't remove", she would like Dr. Carlean Purl to look at this.  He had a cough but that seems to have improved and she was worried that coughing would effect his varices.  She reports he does not get along with his primary care, she reports that she is rude to [place]. She wants to switch primary cares for [place].  I discussed with Enid Derry that due to his Medicaid he/she will need to contact Medicaid and ask to be assigned to a new primary care MD.  Patient is scheduled for follow up for 02/09/16. She will keep the appt for 02/09/16 for now. She will call back if she has other concerns.

## 2016-01-09 ENCOUNTER — Other Ambulatory Visit: Payer: Self-pay | Admitting: Internal Medicine

## 2016-01-09 ENCOUNTER — Other Ambulatory Visit: Payer: Self-pay | Admitting: Infectious Disease

## 2016-01-09 DIAGNOSIS — B20 Human immunodeficiency virus [HIV] disease: Secondary | ICD-10-CM

## 2016-01-09 NOTE — Telephone Encounter (Signed)
Ok to refill [name]? 

## 2016-01-18 ENCOUNTER — Other Ambulatory Visit: Payer: Self-pay | Admitting: Internal Medicine

## 2016-01-26 ENCOUNTER — Other Ambulatory Visit: Payer: Self-pay | Admitting: Internal Medicine

## 2016-01-27 NOTE — Telephone Encounter (Signed)
May we refill [name], thank you. 

## 2016-01-27 NOTE — Telephone Encounter (Signed)
Faxed rx to the pharmacy, prints out due to the class of drug it is.

## 2016-01-27 NOTE — Telephone Encounter (Signed)
OK to refill when time is right refill x 1

## 2016-01-27 NOTE — Telephone Encounter (Signed)
Called and spoke with the pharmacy and xanax is due for refill, its not early.

## 2016-01-29 ENCOUNTER — Other Ambulatory Visit: Payer: Self-pay | Admitting: Internal Medicine

## 2016-02-09 ENCOUNTER — Other Ambulatory Visit (INDEPENDENT_AMBULATORY_CARE_PROVIDER_SITE_OTHER): Payer: Medicaid Other

## 2016-02-09 ENCOUNTER — Encounter: Payer: Self-pay | Admitting: Internal Medicine

## 2016-02-09 ENCOUNTER — Ambulatory Visit (INDEPENDENT_AMBULATORY_CARE_PROVIDER_SITE_OTHER): Payer: Medicaid Other | Admitting: Internal Medicine

## 2016-02-09 ENCOUNTER — Other Ambulatory Visit: Payer: Self-pay | Admitting: Internal Medicine

## 2016-02-09 DIAGNOSIS — K7290 Hepatic failure, unspecified without coma: Secondary | ICD-10-CM

## 2016-02-09 DIAGNOSIS — Z23 Encounter for immunization: Secondary | ICD-10-CM

## 2016-02-09 DIAGNOSIS — K7682 Hepatic encephalopathy: Secondary | ICD-10-CM

## 2016-02-09 DIAGNOSIS — F411 Generalized anxiety disorder: Secondary | ICD-10-CM

## 2016-02-09 DIAGNOSIS — K7030 Alcoholic cirrhosis of liver without ascites: Secondary | ICD-10-CM

## 2016-02-09 LAB — COMPREHENSIVE METABOLIC PANEL
ALT: 24 U/L (ref 0–53)
AST: 33 U/L (ref 0–37)
Albumin: 4.2 g/dL (ref 3.5–5.2)
Alkaline Phosphatase: 113 U/L (ref 39–117)
BILIRUBIN TOTAL: 1.5 mg/dL — AB (ref 0.2–1.2)
BUN: 8 mg/dL (ref 6–23)
CO2: 28 mEq/L (ref 19–32)
CREATININE: 0.95 mg/dL (ref 0.40–1.50)
Calcium: 9.1 mg/dL (ref 8.4–10.5)
Chloride: 103 mEq/L (ref 96–112)
GFR: 88.74 mL/min (ref >60.00)
Glucose, Bld: 119 mg/dL — ABNORMAL HIGH (ref 70–99)
POTASSIUM: 3.7 meq/L (ref 3.5–5.1)
Sodium: 139 mEq/L (ref 135–145)
Total Protein: 7.2 g/dL (ref 6.0–8.3)

## 2016-02-09 LAB — CBC WITH DIFFERENTIAL/PLATELET
Basophils Absolute: 0 10*3/uL (ref 0.0–0.1)
Basophils Relative: 0.2 % (ref 0.0–3.0)
EOS PCT: 2.5 % (ref 0.0–5.0)
Eosinophils Absolute: 0.1 10*3/uL (ref 0.0–0.7)
HCT: 37.6 % — ABNORMAL LOW (ref 39.0–52.0)
Hemoglobin: 12.3 g/dL — ABNORMAL LOW (ref 13.0–17.0)
LYMPHS ABS: 1.1 10*3/uL (ref 0.7–4.0)
Lymphocytes Relative: 29.9 % (ref 12.0–46.0)
MCHC: 32.6 g/dL (ref 30.0–36.0)
MCV: 82.5 fl (ref 78.0–100.0)
MONOS PCT: 9.6 % (ref 3.0–12.0)
Monocytes Absolute: 0.3 10*3/uL (ref 0.1–1.0)
NEUTROS PCT: 57.8 % (ref 43.0–77.0)
Neutro Abs: 2.1 10*3/uL (ref 1.4–7.7)
RBC: 4.56 Mil/uL (ref 4.22–5.81)
RDW: 18.2 % — AB (ref 11.5–15.5)
WBC: 3.6 10*3/uL — ABNORMAL LOW (ref 4.0–10.5)

## 2016-02-09 LAB — PROTIME-INR
INR: 1.2 ratio — ABNORMAL HIGH (ref 0.8–1.0)
PROTHROMBIN TIME: 12.9 s (ref 9.6–13.1)

## 2016-02-09 NOTE — Assessment & Plan Note (Signed)
Labs and [place] today

## 2016-02-09 NOTE — Telephone Encounter (Signed)
Refill [name]? 

## 2016-02-09 NOTE — Assessment & Plan Note (Addendum)
Stable [place] and labs today Reminded about need for flu vaccine coming up Prevnar vaccine to be given today Note he is not a candidate for transplant due to social situation and comorbidities. He is not followed at [facility] anymore.

## 2016-02-09 NOTE — Progress Notes (Signed)
   Subjective:    Patient ID: [name], male    DOB: 1965-08-17, 50 y.o.   MRN: [id number] Cc: f/u cirrhosis HPI [name] is here today reporting that he is doing well overall. He has had some back pain buttock chiropractors helping, he is sticking to swimming and avoiding weightlifting. Did have a stumble and had alcohol once over the summer but none since. He is not having any edema. He continues to be concerned about swelling or fluid buildup in his abdomen and around his pectoral areas where he has had changes related to obesity in the past.  Medications, allergies, past medical history, past surgical history, family history and social history are reviewed and updated in the EMR.  Review of Systems As per history of present illness    Objective:   Physical Exam @BP  128/80 (BP Location: Left Arm, Patient Position: Sitting, Cuff Size: Large)   Pulse 76   Ht 5' 10.75" (1.797 m)   Wt (!) 304 lb 12.8 oz (138.3 kg)   BMI 42.81 kg/m @  General:  Well-developed, well-nourished and in no acute distress, he is obese Eyes:  anicteric. Lungs: Clear to auscultation bilaterally. Chest:  Moderate gynecomastia  Heart:  S1S2, no rubs, murmurs, gallops. Abdomen:  Obese soft, non-tender, no hepatosplenomegaly, hernia, or mass and BS+.  Extremities:   no edema, cyanosis or clubbing are is some dark hyperpigmentation in the lateral aspect of the right lower extremity no real rash. These look like venous stasis changes. Neuro:  A&O x 3. There is no asterixis. He is as clear as he has been in some time. Psych:  appropriate mood and  Affect.   Data Reviewed: Last ID notes and counselor notes from May 2017.      Assessment & Plan:  Alcoholic cirrhosis of liver without ascites ([place]) Labs and [place] today  Encephalopathy, hepatic NH3 level rechecked today He is as clear as he has been in some time  Anxiety state Stable - is on chronic xanax x years - not optimal but I think risks of  withdrawal outweigh benefits of stopping  Alcoholic cirrhosis of liver Stable [place] and labs today Reminded about need for flu vaccine coming up Prevnar vaccine to be given today Note he is not a candidate for transplant due to social situation and comorbidities. He is not followed at [facility] anymore.  I appreciate the opportunity to care for this patient. CC: Greig Right, MD Dr. Tommy Medal

## 2016-02-09 NOTE — Patient Instructions (Addendum)
  Your physician has requested that you go to the basement for the lab work before leaving today.    You have been scheduled for an abdominal ultrasound at [facility] (1st floor of hospital) on 02/13/16 at 11:00AM. Please arrive 15 minutes prior to your appointment for registration. Make certain not to have anything to eat or drink 6 hours prior to your appointment. Should you need to reschedule your appointment, please contact radiology at [phone]. This test typically takes about 30 minutes to perform.   Today you have been given a Prevnar 13 vaccine and the information sheet to go with it.   I appreciate the opportunity to care for you. Silvano Rusk, MD, [facility]

## 2016-02-09 NOTE — Assessment & Plan Note (Signed)
Stable - is on chronic xanax x years - not optimal but I think risks of withdrawal outweigh benefits of stopping

## 2016-02-09 NOTE — Assessment & Plan Note (Addendum)
NH3 level rechecked today He is as clear as he has been in some time

## 2016-02-10 LAB — AFP TUMOR MARKER: AFP TUMOR MARKER: 1.7 ng/mL (ref <6.1)

## 2016-02-10 NOTE — Progress Notes (Signed)
Labs all NL or stable mild abnormalities - look good No changes

## 2016-02-11 ENCOUNTER — Telehealth: Payer: Self-pay | Admitting: Internal Medicine

## 2016-02-11 NOTE — Telephone Encounter (Signed)
Patient's mom reports that he has had some aches and chills since the Prevnar vaccine yesterday.  Mom is advised that he can take tylenol per package instructions until symptoms resolve.

## 2016-02-13 ENCOUNTER — Ambulatory Visit (HOSPITAL_COMMUNITY)
Admission: RE | Admit: 2016-02-13 | Discharge: 2016-02-13 | Disposition: A | Discharge status: Home / Self Care | Payer: Medicaid Other | Source: Ambulatory Visit | Attending: Internal Medicine | Admitting: Internal Medicine

## 2016-02-13 DIAGNOSIS — K7290 Hepatic failure, unspecified without coma: Secondary | ICD-10-CM | POA: Diagnosis not present

## 2016-02-13 DIAGNOSIS — K7030 Alcoholic cirrhosis of liver without ascites: Secondary | ICD-10-CM | POA: Insufficient documentation

## 2016-02-13 DIAGNOSIS — R935 Abnormal findings on diagnostic imaging of other abdominal regions, including retroperitoneum: Secondary | ICD-10-CM | POA: Insufficient documentation

## 2016-02-13 DIAGNOSIS — K820 Obstruction of gallbladder: Secondary | ICD-10-CM | POA: Diagnosis not present

## 2016-02-13 DIAGNOSIS — K7682 Hepatic encephalopathy: Secondary | ICD-10-CM

## 2016-02-13 NOTE — Progress Notes (Signed)
Stable cirrhosis no signs of ascites or liver tumors Repeat Limited RUQ [place] 6 mos for cirrhosis

## 2016-02-20 ENCOUNTER — Telehealth: Payer: Self-pay | Admitting: Internal Medicine

## 2016-02-20 NOTE — Telephone Encounter (Signed)
Patient's mother notified of the results.  Mom also reports that [name] is being treated for cellulitis of his leg.  Mom reports he is on clindamycin.  He is feeling better.

## 2016-02-26 ENCOUNTER — Other Ambulatory Visit: Payer: Self-pay | Admitting: Internal Medicine

## 2016-02-26 NOTE — Telephone Encounter (Signed)
Refill x6

## 2016-02-26 NOTE — Telephone Encounter (Signed)
May I refill [name]? 

## 2016-03-02 ENCOUNTER — Other Ambulatory Visit (HOSPITAL_COMMUNITY)
Admission: RE | Admit: 2016-03-02 | Discharge: 2016-03-02 | Disposition: A | Discharge status: Home / Self Care | Payer: Medicaid Other | Source: Ambulatory Visit | Attending: Infectious Disease | Admitting: Infectious Disease

## 2016-03-02 ENCOUNTER — Encounter: Payer: Self-pay | Admitting: Infectious Disease

## 2016-03-02 ENCOUNTER — Ambulatory Visit (INDEPENDENT_AMBULATORY_CARE_PROVIDER_SITE_OTHER): Payer: Medicaid Other | Admitting: Infectious Disease

## 2016-03-02 VITALS — BP 137/85 | HR 82 | Temp 98.6°F | Ht 72.0 in | Wt 311.0 lb

## 2016-03-02 DIAGNOSIS — I878 Other specified disorders of veins: Secondary | ICD-10-CM | POA: Diagnosis not present

## 2016-03-02 DIAGNOSIS — K7031 Alcoholic cirrhosis of liver with ascites: Secondary | ICD-10-CM | POA: Diagnosis not present

## 2016-03-02 DIAGNOSIS — B20 Human immunodeficiency virus [HIV] disease: Secondary | ICD-10-CM

## 2016-03-02 DIAGNOSIS — F411 Generalized anxiety disorder: Secondary | ICD-10-CM | POA: Diagnosis not present

## 2016-03-02 DIAGNOSIS — Z113 Encounter for screening for infections with a predominantly sexual mode of transmission: Secondary | ICD-10-CM | POA: Insufficient documentation

## 2016-03-02 DIAGNOSIS — L03115 Cellulitis of right lower limb: Secondary | ICD-10-CM | POA: Diagnosis not present

## 2016-03-02 DIAGNOSIS — K7290 Hepatic failure, unspecified without coma: Secondary | ICD-10-CM

## 2016-03-02 DIAGNOSIS — R188 Other ascites: Secondary | ICD-10-CM | POA: Diagnosis not present

## 2016-03-02 DIAGNOSIS — Z23 Encounter for immunization: Secondary | ICD-10-CM

## 2016-03-02 DIAGNOSIS — D61818 Other pancytopenia: Secondary | ICD-10-CM | POA: Diagnosis not present

## 2016-03-02 DIAGNOSIS — K7682 Hepatic encephalopathy: Secondary | ICD-10-CM

## 2016-03-02 HISTORY — DX: Other specified disorders of veins: I87.8

## 2016-03-02 HISTORY — DX: Cellulitis of right lower limb: L03.115

## 2016-03-02 LAB — CBC WITH DIFFERENTIAL/PLATELET
BASOS ABS: 40 {cells}/uL (ref 0–200)
Basophils Relative: 1 %
EOS ABS: 80 {cells}/uL (ref 15–500)
Eosinophils Relative: 2 %
HEMATOCRIT: 35.5 % — AB (ref 38.5–50.0)
HEMOGLOBIN: 11.4 g/dL — AB (ref 13.2–17.1)
LYMPHS ABS: 1120 {cells}/uL (ref 850–3900)
LYMPHS PCT: 28 %
MCH: 26.1 pg — AB (ref 27.0–33.0)
MCHC: 32.1 g/dL (ref 32.0–36.0)
MCV: 81.2 fL (ref 80.0–100.0)
MONO ABS: 400 {cells}/uL (ref 200–950)
MPV: 10.3 fL (ref 7.5–12.5)
Monocytes Relative: 10 %
NEUTROS PCT: 59 %
Neutro Abs: 2360 cells/uL (ref 1500–7800)
Platelets: 101 10*3/uL — ABNORMAL LOW (ref 140–400)
RBC: 4.37 MIL/uL (ref 4.20–5.80)
RDW: 16.2 % — AB (ref 11.0–15.0)
WBC: 4 10*3/uL (ref 3.8–10.8)

## 2016-03-02 NOTE — Progress Notes (Signed)
Chief complaint: having trouble with fatigue, also suffering from ED Subjective:    Patient ID: [name], male    DOB: [birth year], 50 y.o.   MRN: [id number]  Chief complaint: lower extremity venous stasis changes, sp recent treatment for cellulitis and c/o abdominal girth  HPI  [name] is a 50 y.o. male with HIV infection, alcoholic cirrhosis,  who had been  doing superbly well on his antiviral regimen ofTivicay and nd Descovy and his VL remains suppressed and CD4 is above hovering near 200.  Since I last saw him he was seen in urgent care this past weekend and thought to have cellulitis and rx keflex and he is 5 days into rx though on exam today he largely has venous stasis changes.   He is c/o of having had abdominal girth some of which he believes is ascites (and it well could be) and requests therapeutic paracentesis due to how this makes him feel and impacts his body image.     Lab Results  Component Value Date   HIV1RNAQUANT <20 09/23/2015   HIV1RNAQUANT <20 07/14/2015   HIV1RNAQUANT 27 (H) 04/21/2015     Lab Results  Component Value Date   CD4TABS 190 (L) 09/23/2015   CD4TABS 190 (L) 07/14/2015   CD4TABS 210 (L) 04/21/2015     Past Medical History:  Diagnosis Date  . Alcoholic cirrhosis of liver ([place])   . Alcoholism, chronic ([place])   . ANXIETY 06/23/2006  . Ascites 11/13/2009  . Ascites   . Avulsion fracture of middle phalanges of  3rd/4th fingers left hand 01/07/2014  . Blood dyscrasia    HIV  . DEPRESSION 06/23/2006  . 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 Erectile dysfunction 07/14/2015  . 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 Gastric ulcer 06/2010  . GERD (gastroesophageal reflux disease)   . HIV DISEASE 06/23/2006  . HYPERLIPIDEMIA 06/23/2006  . HYPERTENSION 06/23/2006  . 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 Iron deficiency anemia   . Left foot infection   . Obesity (BMI 30-39.9)    BMI 34 kg/m^2  . Portal  hypertensive gastropathy 01/02/2013  . SBP (spontaneous bacterial peritonitis) ([place]) 05/03/2011   Suspected by high leukocytes on paracentesis. Clinical scenario also compatible. November 2012 responded to Levaquin. Started on trimethoprim-sulfamethoxazole double strength daily for prophylaxis.   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 Skin cancer    left calf  . STRAIN, CHEST WALL 03/15/2007  . Varices, esophageal ([place]) 06/2010    Past Surgical History:  Procedure Laterality Date  . CARPAL TUNNEL RELEASE     left  . COLONOSCOPY  march 2013  . ESOPHAGEAL BANDING  05/01/2013   Procedure: ESOPHAGEAL BANDING;  Surgeon: Gatha Mayer, MD;  Location: [facility];  Service: Endoscopy;;  . ESOPHAGOGASTRODUODENOSCOPY  07/01/2010;  08/12/10   small varices, gastric ulcer  . ESOPHAGOGASTRODUODENOSCOPY  10/26/2011   Procedure: ESOPHAGOGASTRODUODENOSCOPY (EGD);  Surgeon: Gatha Mayer, MD;  Location: [facility];  Service: Endoscopy;  Laterality: N/A;  . ESOPHAGOGASTRODUODENOSCOPY N/A 01/02/2013   Procedure: ESOPHAGOGASTRODUODENOSCOPY (EGD);  Surgeon: Gatha Mayer, MD;  Location: [facility];  Service: Endoscopy;  Laterality: N/A;  . ESOPHAGOGASTRODUODENOSCOPY N/A 04/23/2013   Procedure: ESOPHAGOGASTRODUODENOSCOPY (EGD);  Surgeon: Gatha Mayer, MD;  Location: [facility];  Service: Endoscopy;  Laterality: N/A;  . ESOPHAGOGASTRODUODENOSCOPY N/A 05/01/2013   Procedure: ESOPHAGOGASTRODUODENOSCOPY (EGD);  Surgeon: Gatha Mayer, MD;  Location: [facility];  Service: Endoscopy;  Laterality: N/A;  follow-up varices and possibly band them  .  ESOPHAGOGASTRODUODENOSCOPY N/A 09/04/2013   Procedure: ESOPHAGOGASTRODUODENOSCOPY (EGD);  Surgeon: Gatha Mayer, MD;  Location: [facility];  Service: Endoscopy;  Laterality: N/A;  . ESOPHAGOGASTRODUODENOSCOPY N/A 09/10/2014   Procedure: ESOPHAGOGASTRODUODENOSCOPY (EGD);  Surgeon: Gatha Mayer, MD;  Location: [facility];  Service: Endoscopy;  Laterality:  N/A;  . ESOPHAGOGASTRODUODENOSCOPY (EGD) WITH PROPOFOL N/A 08/21/2015   Procedure: ESOPHAGOGASTRODUODENOSCOPY (EGD) WITH PROPOFOL;  Surgeon: Gatha Mayer, MD;  Location: [facility];  Service: Endoscopy;  Laterality: N/A;  . ESOPHAGOGASTRODUODENOSCOPY W/ BANDING  06/26/2010   variceal ligation  . GASTRIC VARICES BANDING N/A 01/02/2013   Procedure: GASTRIC VARICES BANDING;  Surgeon: Gatha Mayer, MD;  Location: [facility];  Service: Endoscopy;  Laterality: N/A;  possible banding  . UPPER GASTROINTESTINAL ENDOSCOPY      Family History  Problem Relation Age of Onset  . Hyperlipidemia Mother   . Hypertension Mother   . Breast cancer Mother     questionable  . Heart disease Mother   . Hypertension Father   . Irritable bowel syndrome Father   . Drug abuse Brother   . Heart disease Brother   . Breast cancer Maternal Aunt     maternal great aunt  . Alcohol abuse Other   . Heart disease Maternal Uncle   . Irritable bowel syndrome Paternal Aunt   . Colon cancer Neg Hx       Social History   Social History  . Marital status: Single    Spouse name: N/A  . Number of children: N/A  . Years of education: N/A   Occupational History  . disability    Social History Main Topics  . Smoking status: Former Smoker    Packs/day: 0.10    Years: 10.00    Types: Cigarettes    Start date: 03/03/2014    Quit date: 04/03/2014  . Smokeless tobacco: Never Used     Comment: form given 05-19-11  . Alcohol use No     Comment: pt is an alcoholic currently in [employer]  . Drug use: No  . Sexual activity: Yes    Partners: Male    Birth control/ protection: Condom     Comment: pt. declined condoms   Other Topics Concern  . None   Social History Narrative   Single, disabled hair stylist   Lives with parents in a home with a basement.  Parents do not like for him to go down stairs because they are steep.               Allergies  Allergen Reactions  . Penicillins      REACTION: hives Has patient had a PCN reaction causing immediate rash, facial/tongue/throat swelling, SOB or lightheadedness with hypotension: Yes Has patient had a PCN reaction causing severe rash involving mucus membranes or skin necrosis: Yes Has patient had a PCN reaction that required hospitalization No Has patient had a PCN reaction occurring within the last 10 years: Yes If all of the above answers are "NO", then may proceed with Cephalosporin use.   . Sulfa Antibiotics      Current Outpatient Prescriptions:  .  ALPRAZolam (XANAX) 1 MG tablet, TAKE 1 TABLET BY MOUTH TWICE DAILY, Disp: 60 tablet, Rfl: 5 .  AMBULATORY NON FORMULARY MEDICATION, Lift Chair- Use as needed Dx: generalized weakness, hepatic encephalopathy, Disp: 1 each, Rfl: 0 .  aMILoride (MIDAMOR) 5 MG tablet, TAKE 3 TABLETS(15 MG) BY MOUTH DAILY, Disp: 90 tablet, Rfl: 0 .  amitriptyline (ELAVIL) 100 MG tablet,  Take 1 tablet (100 mg total) by mouth at bedtime., Disp: 30 tablet, Rfl: 11 .  cephALEXin (KEFLEX) 500 MG capsule, Take 1 capsule by mouth 2 (two) times daily., Disp: , Rfl: 0 .  clindamycin (CLEOCIN) 300 MG capsule, Take 1 capsule by mouth 3 (three) times daily., Disp: , Rfl: 0 .  DESCOVY 200-25 MG tablet, TAKE 1 TABLET BY MOUTH DAILY, Disp: 30 tablet, Rfl: 5 .  esomeprazole (NEXIUM) 40 MG capsule, Take 1 capsule (40 mg total) by mouth daily before breakfast., Disp: 30 capsule, Rfl: 0 .  furosemide (LASIX) 40 MG tablet, TAKE 2 TABLETS(80 MG) BY MOUTH TWICE DAILY, Disp: 120 tablet, Rfl: 0 .  lactulose (CHRONULAC) 10 GM/15ML solution, TAKE 60ML BY MOUTH FOUR TIMES DAILY, Disp: 7316 mL, Rfl: 5 .  Polyethyl Glycol-Propyl Glycol (SYSTANE OP), Apply 1 drop to eye as needed (dry eyes)., Disp: , Rfl:  .  polyethylene glycol powder (GLYCOLAX/MIRALAX) powder, DISSOLVE 1 CAPFUL IN WATER TWICE A DAY AS DIRECTED, Disp: 1054 g, Rfl: 0 .  potassium chloride SA (K-DUR,KLOR-CON) 20 MEQ tablet, TAKE 2 TABLETS BY MOUTH EVERY DAY,  Disp: 60 tablet, Rfl: 5 .  rifaximin (XIFAXAN) 550 MG TABS tablet, Take 1 tablet (550 mg total) by mouth 2 (two) times daily., Disp: 60 tablet, Rfl: 5 .  senna (SENOKOT) 8.6 MG TABS tablet, TAKE 2 TABLETS(17.2 MG) BY MOUTH DAILY. START WITH 1 TABLET (Patient taking differently: TAKE 2 TABLETS(17.2 MG) BY MOUTH DAILY.), Disp: 60 tablet, Rfl: 0 .  sildenafil (VIAGRA) 25 MG tablet, Take 1 tablet (25 mg total) by mouth daily as needed for erectile dysfunction., Disp: 10 tablet, Rfl: 4 .  sodium phosphate (FLEET) enema, Place 1 enema rectally daily. at night, Disp: , Rfl:  .  SSD 1 % cream, Apply 1 application topically daily as needed (Fever blisters.). , Disp: , Rfl: 0 .  TIVICAY 50 MG tablet, TAKE 1 TABLET BY MOUTH ONCE A DAY, Disp: 30 tablet, Rfl: 11 .  traMADol (ULTRAM) 50 MG tablet, TAKE 1 TO 2 TABLETS BY MOUTH Q 4 H PRN P, Disp: , Rfl: 0 .  triamcinolone cream (KENALOG) 0.1 %, APPLY TO AFFECTED AREA DAILY AS NEEDED FOR ITCHING OR RASH, Disp: , Rfl: 1   Review of Systems  Constitutional: Negative for chills, diaphoresis, fever and unexpected weight change.  HENT: Negative for congestion, rhinorrhea, sinus pressure, sneezing, sore throat and trouble swallowing.   Eyes: Negative for photophobia and visual disturbance.  Respiratory: Negative for cough, chest tightness, shortness of breath, wheezing and stridor.   Cardiovascular: Negative for chest pain, palpitations and leg swelling.  Gastrointestinal: Negative for abdominal distention, abdominal pain, anal bleeding, blood in stool, constipation, diarrhea, nausea and vomiting.  Genitourinary: Negative for difficulty urinating, dysuria, flank pain and hematuria.  Musculoskeletal: Positive for gait problem. Negative for arthralgias, back pain, joint swelling and myalgias.  Skin: Positive for color change. Negative for pallor, rash and wound.  Neurological: Positive for weakness. Negative for dizziness, tremors, seizures and light-headedness.    Hematological: Negative for adenopathy. Does not bruise/bleed easily.  Psychiatric/Behavioral: Negative for agitation, behavioral problems, decreased concentration and dysphoric mood. The patient is not nervous/anxious.        Objective:   Physical Exam  Constitutional: He is oriented to person, place, and time. He appears well-developed and well-nourished. No distress.  HENT:  Head: Normocephalic and atraumatic.  Mouth/Throat: Oropharynx is clear and moist. No oropharyngeal exudate.  Eyes: Conjunctivae and EOM are normal. Pupils are equal, round,  and reactive to light. No scleral icterus.  Neck: Normal range of motion. Neck supple. No JVD present.  Cardiovascular: Normal rate and regular rhythm.   Pulmonary/Chest: Effort normal. No respiratory distress. He has no wheezes.  Abdominal: Soft. He exhibits distension. There is no tenderness.  Musculoskeletal: He exhibits no edema or tenderness.  Lymphadenopathy:    He has no cervical adenopathy.  Neurological: He is alert and oriented to person, place, and time. No sensory deficit. He exhibits normal muscle tone. Gait normal.  Skin: Skin is warm and dry. He is not diaphoretic. There is erythema. No pallor.     Psychiatric: He has a normal mood and affect. Thought content normal. His speech is delayed. He is slowed.        Assessment & Plan:   HIV: continue  TIVICAY and DESCOVy   Recent bout of cellulitis: today largely seeing venous stasis changes  Possible ascites: I will cc Dr. Carlean Purl but not sure how one paracentesis is going to make a large difference here, serial ones perhaps.   Obesity: this also still plays role in depression  Depression and problems with body image and anxiety: he needs to continue meeting with Leveda Anna and Grayland Ormond. He is on benzodiazepene from Dr. Carlean Purl.    Etoh induced cirrhosis with varices: followed by [facility], Dr. Carlean Purl. His [place] did now show anything suggestive of [place]  Hepatic encephalopathy:  continues to be fairly slowed during visits with him.   I spent greater than 25  minutes with the patient including greater than 50% of time in face to face counsel of the patient re his HIV, Etoh induced cirrhosis, , hepatic encephalopathy, cellulitis, ascites, venous stasis , obesity anxiety, and in coordination of his care.

## 2016-03-03 LAB — COMPLETE METABOLIC PANEL WITH GFR
ALT: 18 U/L (ref 9–46)
AST: 32 U/L (ref 10–35)
Albumin: 3.9 g/dL (ref 3.6–5.1)
Alkaline Phosphatase: 83 U/L (ref 40–115)
BUN: 13 mg/dL (ref 7–25)
CALCIUM: 8.7 mg/dL (ref 8.6–10.3)
CHLORIDE: 102 mmol/L (ref 98–110)
CO2: 26 mmol/L (ref 20–31)
Creat: 0.98 mg/dL (ref 0.70–1.33)
GFR, Est African American: >89 mL/min (ref >=60)
GFR, Est Non African American: 89 mL/min (ref >=60)
GLUCOSE: 107 mg/dL — AB (ref 65–99)
POTASSIUM: 3.6 mmol/L (ref 3.5–5.3)
Sodium: 137 mmol/L (ref 135–146)
Total Bilirubin: 1 mg/dL (ref 0.2–1.2)
Total Protein: 6.8 g/dL (ref 6.1–8.1)

## 2016-03-03 LAB — RPR

## 2016-03-03 NOTE — Progress Notes (Signed)
He does not have ascites - hasn't for years He is obese and yes fixated on body image

## 2016-03-04 ENCOUNTER — Telehealth: Payer: Self-pay | Admitting: Internal Medicine

## 2016-03-04 DIAGNOSIS — K7031 Alcoholic cirrhosis of liver with ascites: Secondary | ICD-10-CM

## 2016-03-04 DIAGNOSIS — R635 Abnormal weight gain: Secondary | ICD-10-CM

## 2016-03-04 DIAGNOSIS — R140 Abdominal distension (gaseous): Secondary | ICD-10-CM

## 2016-03-04 LAB — T-HELPER CELL (CD4) - (RCID CLINIC ONLY)
CD4 T CELL ABS: 250 /uL — AB (ref 400–2700)
CD4 T CELL HELPER: 25 % — AB (ref 33–55)

## 2016-03-04 LAB — URINE CYTOLOGY ANCILLARY ONLY
Chlamydia: NEGATIVE
NEISSERIA GONORRHEA: NEGATIVE

## 2016-03-04 NOTE — Telephone Encounter (Signed)
No answer and unable to leave a message.  Machine asked me to enter "remote access code". Will try again tomorrow

## 2016-03-05 LAB — HIV RNA, RTPCR W/R GT (RTI, PI,INT)
HIV 1 RNA Quant: <20 copies/mL
HIV-1 RNA Quant, Log: <1.3 Log copies/mL

## 2016-03-08 ENCOUNTER — Other Ambulatory Visit: Payer: Self-pay | Admitting: Internal Medicine

## 2016-03-08 ENCOUNTER — Telehealth: Payer: Self-pay | Admitting: Internal Medicine

## 2016-03-08 NOTE — Telephone Encounter (Signed)
Wt Readings from Last 3 Encounters:  03/02/16 (!) 311 lb (141.1 kg)  02/09/16 (!) 304 lb 12.8 oz (138.3 kg)  11/03/15 294 lb (133.4 kg)    Weight has jumped up though not much if any ascites on last [place] but that was limited RUQ  Have him do complete abdominal US re: cirrhosis, weight gain and abdominal distention

## 2016-03-08 NOTE — Telephone Encounter (Signed)
No answer again today.  Unable to leave a message

## 2016-03-08 NOTE — Telephone Encounter (Signed)
Patient reports worsening edema, abdominal and legs.  See office note from Dr. Tommy Medal from last week.  Patient reports that he has so much edema in his groin area it is making urination difficulty ( aim).  Please advise

## 2016-03-08 NOTE — Telephone Encounter (Signed)
Have forwarded the refill request for this to Dr Carlean Purl, pharmacy sent over the request.

## 2016-03-08 NOTE — Telephone Encounter (Signed)
Patient notified of recommendations and [place] scheduled for 03/12/16 11:00.  He is notified to arrive at 10:45 and be NPO after midnight.

## 2016-03-08 NOTE — Telephone Encounter (Signed)
May I refill [name], thank you. 

## 2016-03-09 ENCOUNTER — Telehealth: Payer: Self-pay | Admitting: Internal Medicine

## 2016-03-09 NOTE — Telephone Encounter (Signed)
No answer, mailbox is full.

## 2016-03-09 NOTE — Telephone Encounter (Signed)
Reviewed the [place] details with his mother.  All questions answered

## 2016-03-11 ENCOUNTER — Telehealth: Payer: Self-pay | Admitting: Internal Medicine

## 2016-03-11 NOTE — Telephone Encounter (Signed)
Now approved See if can reschedule  Auth #:  [id number]

## 2016-03-11 NOTE — Telephone Encounter (Signed)
Refill

## 2016-03-11 NOTE — Telephone Encounter (Signed)
Hospital cancelled the procedure.  Please advise

## 2016-03-12 ENCOUNTER — Ambulatory Visit (HOSPITAL_COMMUNITY): Payer: Medicaid Other

## 2016-03-12 NOTE — Telephone Encounter (Signed)
Refill x6

## 2016-03-12 NOTE — Telephone Encounter (Signed)
Patient has been rescheduled for 03/19/16 9:30 at [facility]

## 2016-03-12 NOTE — Telephone Encounter (Signed)
Left message for patient to call back  

## 2016-03-13 ENCOUNTER — Other Ambulatory Visit: Payer: Self-pay | Admitting: Internal Medicine

## 2016-03-14 NOTE — Telephone Encounter (Signed)
How many refills [name]? Thank you. 

## 2016-03-15 NOTE — Telephone Encounter (Signed)
Left message for patient to call back  

## 2016-03-15 NOTE — Telephone Encounter (Signed)
Refill x6

## 2016-03-16 NOTE — Telephone Encounter (Signed)
Pt returned call

## 2016-03-16 NOTE — Telephone Encounter (Signed)
Patient notified of the rescheduled [place]

## 2016-03-17 ENCOUNTER — Telehealth: Payer: Self-pay | Admitting: Internal Medicine

## 2016-03-17 NOTE — Telephone Encounter (Signed)
Patient mom calling back again.

## 2016-03-18 ENCOUNTER — Telehealth: Payer: Self-pay | Admitting: Internal Medicine

## 2016-03-18 NOTE — Telephone Encounter (Signed)
All questions answered about upcoming [place]

## 2016-03-18 NOTE — Telephone Encounter (Signed)
All questions answered about [place] tomorrow

## 2016-03-19 ENCOUNTER — Ambulatory Visit (HOSPITAL_COMMUNITY)
Admission: RE | Admit: 2016-03-19 | Discharge: 2016-03-19 | Disposition: A | Discharge status: Home / Self Care | Payer: Medicaid Other | Source: Ambulatory Visit | Attending: Internal Medicine | Admitting: Internal Medicine

## 2016-03-19 DIAGNOSIS — R161 Splenomegaly, not elsewhere classified: Secondary | ICD-10-CM | POA: Insufficient documentation

## 2016-03-19 DIAGNOSIS — K766 Portal hypertension: Secondary | ICD-10-CM | POA: Diagnosis not present

## 2016-03-19 DIAGNOSIS — K7030 Alcoholic cirrhosis of liver without ascites: Secondary | ICD-10-CM | POA: Diagnosis present

## 2016-03-19 DIAGNOSIS — R140 Abdominal distension (gaseous): Secondary | ICD-10-CM | POA: Diagnosis not present

## 2016-03-19 DIAGNOSIS — R635 Abnormal weight gain: Secondary | ICD-10-CM | POA: Diagnosis not present

## 2016-03-19 DIAGNOSIS — N200 Calculus of kidney: Secondary | ICD-10-CM | POA: Insufficient documentation

## 2016-03-19 DIAGNOSIS — K7031 Alcoholic cirrhosis of liver with ascites: Secondary | ICD-10-CM

## 2016-03-19 NOTE — Progress Notes (Signed)
There is no ascites. Weight gain is not from fluid retention. If he has not seen a dietitian he could be referred for obesity.

## 2016-03-22 ENCOUNTER — Other Ambulatory Visit: Payer: Medicaid Other

## 2016-03-26 ENCOUNTER — Other Ambulatory Visit: Payer: Self-pay

## 2016-03-26 ENCOUNTER — Telehealth: Payer: Self-pay | Admitting: Internal Medicine

## 2016-03-26 DIAGNOSIS — E669 Obesity, unspecified: Secondary | ICD-10-CM

## 2016-03-26 NOTE — Telephone Encounter (Signed)
Dr. Carlean Purl please review and advise the results of the [place]

## 2016-03-26 NOTE — Telephone Encounter (Signed)
Patient notified and he wants to see dietician.  I have entered the order and provided mom the number

## 2016-03-26 NOTE — Telephone Encounter (Signed)
Please see result note associated Rosanne Sack spoke to him previously

## 2016-04-02 ENCOUNTER — Other Ambulatory Visit: Payer: Self-pay | Admitting: Internal Medicine

## 2016-04-02 NOTE — Telephone Encounter (Signed)
How many refills [name]? 

## 2016-04-05 ENCOUNTER — Ambulatory Visit: Payer: Medicaid Other | Admitting: Infectious Disease

## 2016-04-05 NOTE — Telephone Encounter (Signed)
Refill x6

## 2016-04-14 ENCOUNTER — Telehealth: Payer: Self-pay | Admitting: Internal Medicine

## 2016-04-14 NOTE — Telephone Encounter (Signed)
Patient notified

## 2016-04-14 NOTE — Telephone Encounter (Signed)
Minor bleeding should not be a big deal - might be from the meloxicam - would prefer he not take that or other NSAIDs

## 2016-04-14 NOTE — Telephone Encounter (Signed)
Patient reports that he fell a few days ago and his been taking meloxicam 7.5.  He is having some rectal bleeding with bright red bleeding  Very minimal amount.  He has been taking enema's daily.  He denies any recent anal sex.  There is minimal bleeding with a BM.  Please advise

## 2016-04-26 ENCOUNTER — Telehealth: Payer: Self-pay | Admitting: Internal Medicine

## 2016-04-26 NOTE — Telephone Encounter (Signed)
Left Linken message to call me back to confirm Nexium dosage.

## 2016-04-26 NOTE — Telephone Encounter (Signed)
Spoke with mom and she said a long time ago we told him if he needed it BID he could do that.  She said he takes it on and off. Please advise as to how you want him to take it.

## 2016-04-26 NOTE — Telephone Encounter (Signed)
Left message to call me back.

## 2016-04-26 NOTE — Telephone Encounter (Signed)
Find out what sxs make him want to take it BID please

## 2016-04-26 NOTE — Telephone Encounter (Signed)
He does not have a chronic ulcer  He can take an antacid or OTC Zantac if needs something extra at this point and we can revisit when he returns

## 2016-04-26 NOTE — Telephone Encounter (Signed)
How many times a day did you want his Nexium to be?  I don't see where we changed it to BID.

## 2016-04-26 NOTE — Telephone Encounter (Signed)
Per mom he said it makes his ulcer feel better.

## 2016-04-26 NOTE — Telephone Encounter (Signed)
I have not changed it Will need clarification from Dwan - ? Incorrect vs another MD did so

## 2016-04-27 NOTE — Telephone Encounter (Signed)
Informed [name]'s [name] of Dr Celesta Aver advise and also called the pharmacy to inform them. She will tell [name].

## 2016-05-31 ENCOUNTER — Other Ambulatory Visit: Payer: Self-pay | Admitting: Internal Medicine

## 2016-06-25 ENCOUNTER — Other Ambulatory Visit: Payer: Self-pay | Admitting: Internal Medicine

## 2016-06-25 NOTE — Telephone Encounter (Signed)
Ok to refill [name]? 

## 2016-06-25 NOTE — Telephone Encounter (Signed)
Refill x 1 year 

## 2016-06-28 ENCOUNTER — Ambulatory Visit (INDEPENDENT_AMBULATORY_CARE_PROVIDER_SITE_OTHER): Payer: Medicaid Other | Admitting: Internal Medicine

## 2016-06-28 ENCOUNTER — Encounter: Payer: Self-pay | Admitting: Internal Medicine

## 2016-06-28 ENCOUNTER — Telehealth: Payer: Self-pay | Admitting: Internal Medicine

## 2016-06-28 VITALS — BP 130/70 | HR 84 | Ht 70.75 in | Wt 320.0 lb

## 2016-06-28 DIAGNOSIS — K7030 Alcoholic cirrhosis of liver without ascites: Secondary | ICD-10-CM

## 2016-06-28 DIAGNOSIS — K7290 Hepatic failure, unspecified without coma: Secondary | ICD-10-CM | POA: Diagnosis not present

## 2016-06-28 DIAGNOSIS — G9009 Other idiopathic peripheral autonomic neuropathy: Secondary | ICD-10-CM

## 2016-06-28 DIAGNOSIS — J069 Acute upper respiratory infection, unspecified: Secondary | ICD-10-CM | POA: Diagnosis not present

## 2016-06-28 DIAGNOSIS — K7682 Hepatic encephalopathy: Secondary | ICD-10-CM

## 2016-06-28 MED ORDER — HYDROMET 5-1.5 MG/5ML PO SYRP: 5.0000 mL | ORAL_SOLUTION | Freq: Four times a day (QID) | ORAL | 0 refills | Status: DC

## 2016-06-28 MED ORDER — AMITRIPTYLINE HCL 100 MG PO TABS: ORAL_TABLET | ORAL | 11 refills | Status: DC

## 2016-06-28 NOTE — Assessment & Plan Note (Signed)
Stable No changes 

## 2016-06-28 NOTE — Assessment & Plan Note (Signed)
Needs to continue high-dose lactulose and Xifaxan He is on a very high-dose of lactulose and needs the Xifaxan as well

## 2016-06-28 NOTE — Assessment & Plan Note (Signed)
Refilled amitriptyline 

## 2016-06-28 NOTE — Progress Notes (Signed)
   [name] 51 y.o. 10-26-1965 [id number]  Assessment & Plan:   Encounter Diagnoses  Name Primary?  . Alcoholic cirrhosis of liver without ascites ([place])   . Encephalopathy, hepatic ([place])   . Idiopathic peripheral autonomic neuropathy   . URI with cough and congestion Yes    Alcoholic cirrhosis of liver Stable No changes  Encephalopathy, hepatic Needs to continue high-dose lactulose and Xifaxan He is on a very high-dose of lactulose and needs the Xifaxan as well  Idiopathic peripheral autonomic neuropathy Refilled amitriptyline     Seems to be doing very well overall. Has labs ordered for 08/2016 ID visit - none here today.  Refill cough med x 1 as still struggling with nocturna cough RTC 6 mos  Subjective:   Chief Complaint: f/u cirrhosis  HPI  [name] is here w/ mom - overall has been ok. Is suffering w/ URI - cough especially at night - has gotten some relief from [facility] and is out ? Refill  Walking better and no recent falls Still uses gym but on a break w/ URI  Medications, allergies, past medical history, past surgical history, family history and social history are reviewed and updated in the EMR.  Review of Systems As above  Objective:   Physical Exam BP 130/70 (BP Location: Left Arm, Patient Position: Sitting, Cuff Size: Normal)   Pulse 84   Ht 5' 10.75" (1.797 m)   Wt (!) 320 lb (145.2 kg)   BMI 44.95 kg/m  NAD Lungs cta Pharynx clear Cor S1s2 no rmg abd obese, soft NT Ext 1+ edema Neuro a and o x 3 and no asterixis  Lab Results  Component Value Date   WBC 4.0 03/02/2016   HGB 11.4 (L) 03/02/2016   HCT 35.5 (L) 03/02/2016   MCV 81.2 03/02/2016   PLT 101 (L) 03/02/2016     Chemistry      Component Value Date/Time   NA 137 03/02/2016 1618   K 3.6 03/02/2016 1618   CL 102 03/02/2016 1618   CO2 26 03/02/2016 1618   BUN 13 03/02/2016 1618   CREATININE 0.98 03/02/2016 1618      Component Value Date/Time   CALCIUM 8.7  03/02/2016 1618   ALKPHOS 83 03/02/2016 1618   AST 32 03/02/2016 1618   ALT 18 03/02/2016 1618   BILITOT 1.0 03/02/2016 1618

## 2016-06-28 NOTE — Telephone Encounter (Signed)
A user error has taken place: ERROR °

## 2016-06-28 NOTE — Patient Instructions (Addendum)
   We have sent the following medications to your pharmacy for you to pick up at your convenience: Amitriptyline  We have given you a printed rx for your cough medicine to take to the pharmacy.  Follow up with Korea in 6 months.    I'm working on a prior authorization for your xifaxan, [facility] will be contacting you.    I appreciate the opportunity to care for you. Silvano Rusk, MD, [facility]

## 2016-07-02 ENCOUNTER — Telehealth: Payer: Self-pay | Admitting: Internal Medicine

## 2016-07-02 NOTE — Telephone Encounter (Signed)
Patient with continued cough and is out of the cough medicine you prescribed on Monday.  He reports that he has a burning type pain and continued cough.  He is asking for a refill

## 2016-07-02 NOTE — Telephone Encounter (Signed)
I've spoken to [employer] and they are working on the prior authorization , the snow has delayed everything.  I also called the [facility] and informed them.  Mom says they have #9 xifaxan sample pills left that he was given at his office visit earlier this week.

## 2016-07-04 NOTE — Telephone Encounter (Signed)
Needs to return to PCP for more refills - I did just one time while he was in seeing me

## 2016-07-05 NOTE — Telephone Encounter (Signed)
Patient notified

## 2016-07-06 NOTE — Telephone Encounter (Signed)
Spoke with Enid Derry and told her I spoke to [facility] earlier today and they are still working on [employer].  He has enough for today and for one dose tomorrow.

## 2016-07-06 NOTE — Telephone Encounter (Signed)
Pt mother calling back wanting to speak with PJ about pt medication

## 2016-07-07 ENCOUNTER — Telehealth: Payer: Self-pay | Admitting: Internal Medicine

## 2016-07-07 NOTE — Telephone Encounter (Signed)
Spoke with [employer] and the xifaxan has been approved and they are going to contact [place] about delivery.  I will call and tell [name]).

## 2016-07-07 NOTE — Telephone Encounter (Signed)
[name] has been approved and they are calling [name] today about delivery.  I'll call and tell Enid Derry (mom)

## 2016-07-08 ENCOUNTER — Telehealth: Payer: Self-pay | Admitting: Internal Medicine

## 2016-07-08 ENCOUNTER — Other Ambulatory Visit: Payer: Self-pay | Admitting: Infectious Disease

## 2016-07-08 DIAGNOSIS — B20 Human immunodeficiency virus [HIV] disease: Secondary | ICD-10-CM

## 2016-07-08 NOTE — Telephone Encounter (Signed)
[facility]'s xifaxan came today at 12:30pm.  I answered mom Shirley's questions and she will call back with any other questions she has.

## 2016-07-09 ENCOUNTER — Telehealth: Payer: Self-pay

## 2016-07-09 NOTE — Telephone Encounter (Signed)
Pt called in to confirm lab appointment and office visit. Pt stated that he notated appointment times and would come in as scheduled.

## 2016-07-12 ENCOUNTER — Telehealth: Payer: Self-pay | Admitting: Internal Medicine

## 2016-07-12 NOTE — Telephone Encounter (Signed)
Tried to call her back and line busy.

## 2016-07-12 NOTE — Telephone Encounter (Signed)
Spoke to [place] and they only got #45 xifaxan on Friday from [facility] instead of #60.  I called [facility] and spoke to [place].  She checked and she's not sure why that happened but they will send out the rest ASAP and they will be calling them on a regular basis to send out his refills.  I told them they can speak with Enid Derry (MOM).  I informed Enid Derry and she is aware she will be getting calls.

## 2016-07-26 ENCOUNTER — Telehealth: Payer: Self-pay | Admitting: Infectious Disease

## 2016-07-26 NOTE — Telephone Encounter (Signed)
[name] Self [phone]  [name] called to request that he see a therapist closer to where he lives, in [place]. He stated that on his last visit that this was discussed that there is one close to him that would be free to him. He has now decided to see one. Please call and let him know.

## 2016-07-29 ENCOUNTER — Encounter: Payer: Self-pay | Admitting: Internal Medicine

## 2016-08-09 ENCOUNTER — Other Ambulatory Visit: Payer: Self-pay | Admitting: Internal Medicine

## 2016-08-09 NOTE — Telephone Encounter (Signed)
  Nexium and xanax refills sent in, faxed the xanax in.

## 2016-08-09 NOTE — Telephone Encounter (Signed)
Refill nexium x 1 year Refill xanax x 4

## 2016-08-09 NOTE — Telephone Encounter (Signed)
Please advise [name], thank you. 

## 2016-08-10 ENCOUNTER — Other Ambulatory Visit: Payer: Medicaid Other

## 2016-08-10 ENCOUNTER — Other Ambulatory Visit (HOSPITAL_COMMUNITY)
Admission: RE | Admit: 2016-08-10 | Discharge: 2016-08-10 | Disposition: A | Discharge status: Home / Self Care | Payer: Medicaid Other | Source: Ambulatory Visit | Attending: Infectious Disease | Admitting: Infectious Disease

## 2016-08-10 DIAGNOSIS — Z113 Encounter for screening for infections with a predominantly sexual mode of transmission: Secondary | ICD-10-CM | POA: Diagnosis present

## 2016-08-10 DIAGNOSIS — B20 Human immunodeficiency virus [HIV] disease: Secondary | ICD-10-CM

## 2016-08-10 LAB — CBC WITH DIFFERENTIAL/PLATELET
BASOS PCT: 0 %
Basophils Absolute: 0 cells/uL (ref 0–200)
EOS PCT: 2 %
Eosinophils Absolute: 82 cells/uL (ref 15–500)
HEMATOCRIT: 38.4 % — AB (ref 38.5–50.0)
Hemoglobin: 12.2 g/dL — ABNORMAL LOW (ref 13.2–17.1)
Lymphocytes Relative: 15 %
Lymphs Abs: 615 cells/uL — ABNORMAL LOW (ref 850–3900)
MCH: 26.6 pg — ABNORMAL LOW (ref 27.0–33.0)
MCHC: 31.8 g/dL — AB (ref 32.0–36.0)
MCV: 83.7 fL (ref 80.0–100.0)
MONO ABS: 246 {cells}/uL (ref 200–950)
MONOS PCT: 6 %
NEUTROS PCT: 77 %
Neutro Abs: 3157 cells/uL (ref 1500–7800)
PLATELETS: 94 10*3/uL — AB (ref 140–400)
RBC: 4.59 MIL/uL (ref 4.20–5.80)
RDW: 17.2 % — AB (ref 11.0–15.0)
WBC: 4.1 10*3/uL (ref 3.8–10.8)

## 2016-08-10 LAB — COMPLETE METABOLIC PANEL WITH GFR
ALT: 20 U/L (ref 9–46)
AST: 35 U/L (ref 10–35)
Albumin: 3.8 g/dL (ref 3.6–5.1)
Alkaline Phosphatase: 116 U/L — ABNORMAL HIGH (ref 40–115)
BUN: 7 mg/dL (ref 7–25)
CO2: 22 mmol/L (ref 20–31)
Calcium: 8.8 mg/dL (ref 8.6–10.3)
Chloride: 103 mmol/L (ref 98–110)
Creat: 0.95 mg/dL (ref 0.70–1.33)
GFR, Est African American: >89 mL/min (ref >=60)
GFR, Est Non African American: >89 mL/min (ref >=60)
Glucose, Bld: 143 mg/dL — ABNORMAL HIGH (ref 65–99)
Potassium: 2.9 mmol/L — ABNORMAL LOW (ref 3.5–5.3)
Sodium: 139 mmol/L (ref 135–146)
Total Bilirubin: 1.1 mg/dL (ref 0.2–1.2)
Total Protein: 6.9 g/dL (ref 6.1–8.1)

## 2016-08-10 LAB — LIPID PANEL
Cholesterol: 132 mg/dL (ref <200)
HDL: 52 mg/dL (ref >40)
LDL Cholesterol: 57 mg/dL (ref <100)
Total CHOL/HDL Ratio: 2.5 Ratio (ref <5.0)
Triglycerides: 114 mg/dL (ref <150)
VLDL: 23 mg/dL (ref <30)

## 2016-08-11 ENCOUNTER — Other Ambulatory Visit: Payer: Medicaid Other

## 2016-08-11 LAB — RPR

## 2016-08-12 ENCOUNTER — Telehealth: Payer: Self-pay

## 2016-08-12 DIAGNOSIS — K7030 Alcoholic cirrhosis of liver without ascites: Secondary | ICD-10-CM

## 2016-08-12 DIAGNOSIS — K7469 Other cirrhosis of liver: Secondary | ICD-10-CM

## 2016-08-12 LAB — URINE CYTOLOGY ANCILLARY ONLY
CHLAMYDIA, DNA PROBE: NEGATIVE
Neisseria Gonorrhea: NEGATIVE

## 2016-08-12 LAB — HIV-1 RNA QUANT-NO REFLEX-BLD
HIV 1 RNA QUANT: 28 {copies}/mL — AB
HIV-1 RNA QUANT, LOG: 1.45 {Log_copies}/mL — AB

## 2016-08-12 LAB — T-HELPER CELL (CD4) - (RCID CLINIC ONLY)
CD4 % Helper T Cell: 24 % — ABNORMAL LOW (ref 33–55)
CD4 T Cell Abs: 150 /uL — ABNORMAL LOW (ref 400–2700)

## 2016-08-12 NOTE — Telephone Encounter (Signed)
-----   Message from Marlon Pel, RN sent at 02/13/2016  2:43 PM EDT ----- Needs [place]- see results 02/13/16

## 2016-08-12 NOTE — Telephone Encounter (Signed)
Patient is scheduled for RUQ abdominal US on 08/18/16 8:30. Mother Enid Derry notified

## 2016-08-12 NOTE — Telephone Encounter (Signed)
Left message for [name] or his mom Enid Derry to call me back.

## 2016-08-12 NOTE — Progress Notes (Signed)
Thanks so much Denise 

## 2016-08-12 NOTE — Telephone Encounter (Signed)
-----   Message from Gatha Mayer, MD sent at 08/12/2016  5:36 PM EST ----- Regarding: needs K [place] had a low K at Dr. Lucianne Lei dam's ofc visit  Please Rx k-dur 40 meq daily  2month supply 5 RF  Have him recheck BMET in 1 month

## 2016-08-13 NOTE — Telephone Encounter (Signed)
I missed that so let us have him go to 60 meq daily and do the labs as requested in earlier note Will need a new rx 1 month supply and 5 RF  Can do 3 20 mEQ tabs

## 2016-08-13 NOTE — Telephone Encounter (Signed)
[name] called in and reports that [name] is on K-Dur 22meq tablets and takes 2 a day.  Does this change the amount you want to rx [name], thank you for your time.

## 2016-08-13 NOTE — Telephone Encounter (Signed)
Left message for [name] to call be back.

## 2016-08-13 NOTE — Telephone Encounter (Signed)
Enid Derry (mom) called back and they will use up the 68meq K-Dur tablets they have on hand to get in his 60 meq daily dose.  They will come in a month and have blood drawn, BMET ordered.

## 2016-08-16 ENCOUNTER — Telehealth: Payer: Self-pay | Admitting: Internal Medicine

## 2016-08-16 NOTE — Telephone Encounter (Signed)
Spoke with mom Enid Derry and she needs a print out of [name]'s office visits for 2017 to send to [facility] for mileage reimbursement.  I will see how to get this and call her back.

## 2016-08-17 NOTE — Telephone Encounter (Signed)
Patient mom called in stating that she would like this faxed to F: [phone] Attn: Kathrynn Speed

## 2016-08-17 NOTE — Telephone Encounter (Signed)
Left him a message to call me back. I have the information they need and need to know if they want it mailed or would they like to pick it up.

## 2016-08-17 NOTE — Telephone Encounter (Signed)
I'm faxing the information they need and also dropped a copy in the mail that has the dates hight lighted so it's easier to read.

## 2016-08-18 ENCOUNTER — Ambulatory Visit (HOSPITAL_COMMUNITY)
Admission: RE | Admit: 2016-08-18 | Discharge: 2016-08-18 | Disposition: A | Discharge status: Home / Self Care | Payer: Medicaid Other | Source: Ambulatory Visit | Attending: Internal Medicine | Admitting: Internal Medicine

## 2016-08-18 DIAGNOSIS — K7469 Other cirrhosis of liver: Secondary | ICD-10-CM

## 2016-08-18 NOTE — Progress Notes (Signed)
[place] ok - no signs of tumors Repeat in 6 mos

## 2016-08-20 ENCOUNTER — Other Ambulatory Visit: Payer: Self-pay | Admitting: Infectious Disease

## 2016-08-20 DIAGNOSIS — B20 Human immunodeficiency virus [HIV] disease: Secondary | ICD-10-CM

## 2016-08-25 ENCOUNTER — Ambulatory Visit: Payer: Medicaid Other | Admitting: Infectious Disease

## 2016-08-31 ENCOUNTER — Other Ambulatory Visit: Payer: Self-pay | Admitting: Internal Medicine

## 2016-08-31 NOTE — Telephone Encounter (Signed)
How many refills [name]? 

## 2016-08-31 NOTE — Telephone Encounter (Signed)
12  

## 2016-09-02 NOTE — Telephone Encounter (Signed)
Spoke to patient and he is not sure where there is a free therapist in [place]. He said he will discuss this with his primary care doctor. Myrtis Hopping

## 2016-09-05 ENCOUNTER — Other Ambulatory Visit: Payer: Self-pay | Admitting: Internal Medicine

## 2016-09-06 NOTE — Telephone Encounter (Signed)
Refill x 1 year 

## 2016-09-06 NOTE — Telephone Encounter (Signed)
How many refills [name]? 

## 2016-09-10 ENCOUNTER — Other Ambulatory Visit: Payer: Self-pay | Admitting: Internal Medicine

## 2016-09-11 NOTE — Telephone Encounter (Signed)
How many refills [name]? 

## 2016-09-13 NOTE — Telephone Encounter (Signed)
1 year refills. 

## 2016-09-16 ENCOUNTER — Other Ambulatory Visit: Payer: Self-pay | Admitting: Infectious Disease

## 2016-09-16 ENCOUNTER — Other Ambulatory Visit: Payer: Self-pay | Admitting: Internal Medicine

## 2016-09-16 DIAGNOSIS — B20 Human immunodeficiency virus [HIV] disease: Secondary | ICD-10-CM

## 2016-09-16 NOTE — Telephone Encounter (Signed)
How many refills [name]?  Thank you for your time.

## 2016-09-16 NOTE — Telephone Encounter (Signed)
Refill x 1 year 

## 2016-09-30 ENCOUNTER — Ambulatory Visit: Payer: Medicaid Other | Admitting: Infectious Disease

## 2016-10-05 ENCOUNTER — Encounter: Payer: Self-pay | Admitting: Infectious Disease

## 2016-10-05 ENCOUNTER — Ambulatory Visit (INDEPENDENT_AMBULATORY_CARE_PROVIDER_SITE_OTHER): Payer: Medicaid Other | Admitting: Infectious Disease

## 2016-10-05 ENCOUNTER — Ambulatory Visit: Payer: Medicaid Other | Admitting: Infectious Disease

## 2016-10-05 VITALS — BP 149/86 | HR 73 | Temp 97.4°F | Wt 310.0 lb

## 2016-10-05 DIAGNOSIS — K3189 Other diseases of stomach and duodenum: Secondary | ICD-10-CM

## 2016-10-05 DIAGNOSIS — W19XXXD Unspecified fall, subsequent encounter: Secondary | ICD-10-CM

## 2016-10-05 DIAGNOSIS — K7030 Alcoholic cirrhosis of liver without ascites: Secondary | ICD-10-CM | POA: Diagnosis not present

## 2016-10-05 DIAGNOSIS — F411 Generalized anxiety disorder: Secondary | ICD-10-CM | POA: Diagnosis not present

## 2016-10-05 DIAGNOSIS — I8510 Secondary esophageal varices without bleeding: Secondary | ICD-10-CM

## 2016-10-05 DIAGNOSIS — D61818 Other pancytopenia: Secondary | ICD-10-CM

## 2016-10-05 DIAGNOSIS — K7290 Hepatic failure, unspecified without coma: Secondary | ICD-10-CM | POA: Diagnosis not present

## 2016-10-05 DIAGNOSIS — K766 Portal hypertension: Secondary | ICD-10-CM | POA: Diagnosis not present

## 2016-10-05 DIAGNOSIS — B20 Human immunodeficiency virus [HIV] disease: Secondary | ICD-10-CM

## 2016-10-05 DIAGNOSIS — K7682 Hepatic encephalopathy: Secondary | ICD-10-CM

## 2016-10-05 DIAGNOSIS — F331 Major depressive disorder, recurrent, moderate: Secondary | ICD-10-CM

## 2016-10-05 MED ORDER — BICTEGRAVIR-EMTRICITAB-TENOFOV 50-200-25 MG PO TABS: 1.0000 | ORAL_TABLET | Freq: Every day | ORAL | 11 refills | Status: DC

## 2016-10-05 MED FILL — BIKTARVY 50-200-25 MG TABS: 50-200-25 | 30 days supply | Qty: 30 | Fill #0

## 2016-10-05 NOTE — Patient Instructions (Signed)
Meet with pharmacy today  Labs today  Switch from [place] and Descovy to [facility] (script sent to [facility]), change other meds to [facility]  Make appt with ID pharmacy in the next 2-4 weeks

## 2016-10-05 NOTE — Progress Notes (Signed)
HPI: [name] is a 51 y.o. male with alcoholic cirrhosis and HIV.   Allergies: Allergies  Allergen Reactions  . Penicillins     REACTION: hives Has patient had a PCN reaction causing immediate rash, facial/tongue/throat swelling, SOB or lightheadedness with hypotension: Yes Has patient had a PCN reaction causing severe rash involving mucus membranes or skin necrosis: Yes Has patient had a PCN reaction that required hospitalization No Has patient had a PCN reaction occurring within the last 10 years: Yes If all of the above answers are "NO", then may proceed with Cephalosporin use.   . Sulfa Antibiotics     Vitals: Temp: 97.4 F (36.3 C) (04/24 1433) Temp Source: Oral (04/24 1433) BP: 149/86 (04/24 1433) Pulse Rate: 73 (04/24 1433)  Past Medical History: Past Medical History:  Diagnosis Date  . Alcoholic cirrhosis of liver ([place])   . Alcoholism, chronic ([place])   . ANXIETY 06/23/2006  . Ascites 11/13/2009  . Ascites   . Avulsion fracture of middle phalanges of  3rd/4th fingers left hand 01/07/2014  . Blood dyscrasia    HIV  . Cellulitis of right lower extremity 03/02/2016  . DEPRESSION 06/23/2006  . 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 Erectile dysfunction 07/14/2015  . 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 Gastric ulcer 06/2010  . GERD (gastroesophageal reflux disease)   . HIV DISEASE 06/23/2006  . HYPERLIPIDEMIA 06/23/2006  . HYPERTENSION 06/23/2006  . 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 Iron deficiency anemia   . Left foot infection   . Obesity (BMI 30-39.9)    BMI 34 kg/m^2  . Portal hypertensive gastropathy ([place]) 01/02/2013  . SBP (spontaneous bacterial peritonitis) ([place]) 05/03/2011   Suspected by high leukocytes on paracentesis. Clinical scenario also compatible. November 2012 responded to Levaquin. Started on trimethoprim-sulfamethoxazole double strength daily for prophylaxis.   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 Skin cancer     left calf  . STRAIN, CHEST WALL 03/15/2007  . 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 Venous stasis 03/02/2016    Social History: Social History   Social History  . Marital status: Single    Spouse name: N/A  . Number of children: N/A  . Years of education: N/A   Occupational History  . disability    Social History Main Topics  . Smoking status: Former Smoker    Packs/day: 0.10    Years: 10.00    Types: Cigarettes    Start date: 03/03/2014    Quit date: 04/03/2014  . Smokeless tobacco: Never Used     Comment: form given 05-19-11  . Alcohol use No     Comment: pt is an alcoholic currently in [employer]  . Drug use: No  . Sexual activity: Yes    Partners: Male    Birth control/ protection: Condom     Comment: pt. declined condoms   Other Topics Concern  . None   Social History Narrative   Single, disabled hair stylist   Lives with parents in a home with a basement.  Parents do not like for him to go down stairs because they are steep.              Current Regimen: Descovy/Tivicay  Labs: HIV 1 RNA Quant (copies/mL)  Date Value  08/10/2016 28 (H)  03/02/2016 <20  09/23/2015 <20   CD4 T Cell Abs (/uL)  Date Value  08/10/2016 150 (L)  03/02/2016 250 (L)  09/23/2015 190 (L)   Hep B S Ab (no  units)  Date Value  05/14/2010 POSITIVE (A)   Hepatitis B Surface Ag (no units)  Date Value  05/09/2010 NEGATIVE   HCV Ab (no units)  Date Value  05/09/2010 NEGATIVE    CrCl: CrCl cannot be calculated (Patient's most recent lab result is older than the maximum 21 days allowed.).  Lipids:    Component Value Date/Time   CHOL 132 08/10/2016 1128   TRIG 114 08/10/2016 1128   HDL 52 08/10/2016 1128   CHOLHDL 2.5 08/10/2016 1128   VLDL 23 08/10/2016 1128   LDLCALC 57 08/10/2016 1128    Assessment: [name] is being switched from [place] and Descovy to [place]. His HIV VL is almost undetectable at 28, however his CD4 count remains low (around  150-200). He also has alcoholic cirrhosis, for which he is on lasix, lactulose, xifaxan, and midamor. In order to simplify his regimen we will switch to biktarvy.   He currently has medicaid and lives with his parents. His monthly income is $700 per month. Since he has medicaid, we can send his biktarvy to [facility] to mail to him. We will also wave the copay due to his financial status.   Recommendations: Stop Descovy and Tivicay Start biktarvy  Dierdre Harness, Cain Sieve, PharmD Clinical Pharmacy Resident [phone] (Pager) 10/05/2016 3:04 PM

## 2016-10-05 NOTE — Progress Notes (Signed)
Chief complaint: having trouble with fatigue, also suffering from ED Subjective:    Patient ID: [name], male    DOB: [birth year], 51 y.o.   MRN: [id number]  Chief complaint: lower extremity venous stasis changes, sp recent treatment for cellulitis and c/o abdominal girth  HPI  [name] is a 51 y.o. male with HIV infection, alcoholic cirrhosis,  who had been  doing superbly well on his antiviral regimen o fTivicay and nd Descovy and his VL remains suppressed and CD4 is above between 150 and  200.   Lab Results  Component Value Date   HIV1RNAQUANT 28 (H) 08/10/2016   HIV1RNAQUANT <20 03/02/2016   HIV1RNAQUANT <20 09/23/2015     Lab Results  Component Value Date   CD4TABS 150 (L) 08/10/2016   CD4TABS 250 (L) 03/02/2016   CD4TABS 190 (L) 09/23/2015    He is being followed very closely by Dr. Carlean Purl for his alcoholic cirrhosis, hepatic encephalopathy, portal gastropathy.  He is still very much wanted to have plastic surgery performed at [facility] and asked if we can help him facilitate this. I told him that his HIV should not be a barrier to him having surgery in my opinion.  Past Medical History:  Diagnosis Date  . Alcoholic cirrhosis of liver ([place])   . Alcoholism, chronic ([place])   . ANXIETY 06/23/2006  . Ascites 11/13/2009  . Ascites   . Avulsion fracture of middle phalanges of  3rd/4th fingers left hand 01/07/2014  . Blood dyscrasia    HIV  . Cellulitis of right lower extremity 03/02/2016  . DEPRESSION 06/23/2006  . 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 Erectile dysfunction 07/14/2015  . 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 Gastric ulcer 06/2010  . GERD (gastroesophageal reflux disease)   . HIV DISEASE 06/23/2006  . HYPERLIPIDEMIA 06/23/2006  . HYPERTENSION 06/23/2006  . 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 Iron deficiency anemia   . Left foot infection   . Obesity (BMI 30-39.9)    BMI 34 kg/m^2  . Portal hypertensive  gastropathy 01/02/2013  . SBP (spontaneous bacterial peritonitis) ([place]) 05/03/2011   Suspected by high leukocytes on paracentesis. Clinical scenario also compatible. November 2012 responded to Levaquin. Started on trimethoprim-sulfamethoxazole double strength daily for prophylaxis.   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 Skin cancer    left calf  . STRAIN, CHEST WALL 03/15/2007  . 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 Venous stasis 03/02/2016    Past Surgical History:  Procedure Laterality Date  . CARPAL TUNNEL RELEASE     left  . COLONOSCOPY  march 2013  . ESOPHAGEAL BANDING  05/01/2013   Procedure: ESOPHAGEAL BANDING;  Surgeon: Gatha Mayer, MD;  Location: [facility] END[facility];  Service: Endoscopy;;  . ESOPHAGOGASTRODUODENOSCOPY  07/01/2010;  08/12/10   small varices, gastric ulcer  . ESOPHAGOGASTRODUODENOSCOPY  10/26/2011   Procedure: ESOPHAGOGASTRODUODENOSCOPY (EGD);  Surgeon: Gatha Mayer, MD;  Location: [facility];  Service: Endoscopy;  Laterality: N/A;  . ESOPHAGOGASTRODUODENOSCOPY N/A 01/02/2013   Procedure: ESOPHAGOGASTRODUODENOSCOPY (EGD);  Surgeon: Gatha Mayer, MD;  Location: [facility];  Service: Endoscopy;  Laterality: N/A;  . ESOPHAGOGASTRODUODENOSCOPY N/A 04/23/2013   Procedure: ESOPHAGOGASTRODUODENOSCOPY (EGD);  Surgeon: Gatha Mayer, MD;  Location: [facility];  Service: Endoscopy;  Laterality: N/A;  . ESOPHAGOGASTRODUODENOSCOPY N/A 05/01/2013   Procedure: ESOPHAGOGASTRODUODENOSCOPY (EGD);  Surgeon: Gatha Mayer, MD;  Location: [facility];  Service: Endoscopy;  Laterality: N/A;  follow-up varices and possibly band them  . ESOPHAGOGASTRODUODENOSCOPY N/A 09/04/2013  Procedure: ESOPHAGOGASTRODUODENOSCOPY (EGD);  Surgeon: Gatha Mayer, MD;  Location: [facility];  Service: Endoscopy;  Laterality: N/A;  . ESOPHAGOGASTRODUODENOSCOPY N/A 09/10/2014   Procedure: ESOPHAGOGASTRODUODENOSCOPY (EGD);  Surgeon: Gatha Mayer, MD;  Location: [facility];  Service:  Endoscopy;  Laterality: N/A;  . ESOPHAGOGASTRODUODENOSCOPY (EGD) WITH PROPOFOL N/A 08/21/2015   Procedure: ESOPHAGOGASTRODUODENOSCOPY (EGD) WITH PROPOFOL;  Surgeon: Gatha Mayer, MD;  Location: [facility];  Service: Endoscopy;  Laterality: N/A;  . ESOPHAGOGASTRODUODENOSCOPY W/ BANDING  06/26/2010   variceal ligation  . GASTRIC VARICES BANDING N/A 01/02/2013   Procedure: GASTRIC VARICES BANDING;  Surgeon: Gatha Mayer, MD;  Location: [facility];  Service: Endoscopy;  Laterality: N/A;  possible banding  . UPPER GASTROINTESTINAL ENDOSCOPY      Family History  Problem Relation Age of Onset  . Hyperlipidemia Mother   . Hypertension Mother   . Breast cancer Mother     questionable  . Heart disease Mother   . Hypertension Father   . Irritable bowel syndrome Father   . Drug abuse Brother   . Heart disease Brother   . Breast cancer Maternal Aunt     maternal great aunt  . Alcohol abuse Other   . Heart disease Maternal Uncle   . Irritable bowel syndrome Paternal Aunt   . Colon cancer Neg Hx       Social History   Social History  . Marital status: Single    Spouse name: N/A  . Number of children: N/A  . Years of education: N/A   Occupational History  . disability    Social History Main Topics  . Smoking status: Former Smoker    Packs/day: 0.10    Years: 10.00    Types: Cigarettes    Start date: 03/03/2014    Quit date: 04/03/2014  . Smokeless tobacco: Never Used     Comment: form given 05-19-11  . Alcohol use No     Comment: pt is an alcoholic currently in [employer]  . Drug use: No  . Sexual activity: Yes    Partners: Male    Birth control/ protection: Condom     Comment: pt. declined condoms   Other Topics Concern  . Not on file   Social History Narrative   Single, disabled hair stylist   Lives with parents in a home with a basement.  Parents do not like for him to go down stairs because they are steep.               Allergies  Allergen  Reactions  . Penicillins     REACTION: hives Has patient had a PCN reaction causing immediate rash, facial/tongue/throat swelling, SOB or lightheadedness with hypotension: Yes Has patient had a PCN reaction causing severe rash involving mucus membranes or skin necrosis: Yes Has patient had a PCN reaction that required hospitalization No Has patient had a PCN reaction occurring within the last 10 years: Yes If all of the above answers are "NO", then may proceed with Cephalosporin use.   . Sulfa Antibiotics      Current Outpatient Prescriptions:  .  ALPRAZolam (XANAX) 1 MG tablet, TAKE 1 TABLET BY MOUTH TWICE DAILY, Disp: 60 tablet, Rfl: 3 .  AMBULATORY NON FORMULARY MEDICATION, Lift Chair- Use as needed Dx: generalized weakness, hepatic encephalopathy, Disp: 1 each, Rfl: 0 .  aMILoride (MIDAMOR) 5 MG tablet, TAKE 3 TABLETS(15 MG) BY MOUTH DAILY, Disp: 90 tablet, Rfl: 11 .  amitriptyline (ELAVIL) 100 MG tablet, TAKE 1 TABLET(100  MG) BY MOUTH AT BEDTIME, Disp: 30 tablet, Rfl: 11 .  cephALEXin (KEFLEX) 500 MG capsule, Take 1 capsule by mouth 2 (two) times daily., Disp: , Rfl: 0 .  clindamycin (CLEOCIN) 300 MG capsule, Take 1 capsule by mouth 3 (three) times daily., Disp: , Rfl: 0 .  DESCOVY 200-25 MG tablet, TAKE 1 TABLET BY MOUTH DAILY, Disp: 30 tablet, Rfl: 5 .  esomeprazole (NEXIUM) 40 MG capsule, Take 1 capsule (40 mg total) by mouth daily before breakfast., Disp: 30 capsule, Rfl: 0 .  furosemide (LASIX) 40 MG tablet, TAKE 2 TABLETS(80 MG) BY MOUTH TWICE DAILY, Disp: 120 tablet, Rfl: 11 .  guaiFENesin (MUCINEX) 600 MG 12 hr tablet, Take 600 mg by mouth 2 (two) times daily., Disp: , Rfl:  .  HYDROMET 5-1.5 MG/5ML syrup, Take 5 mLs by mouth every 6 (six) hours., Disp: 120 mL, Rfl: 0 .  lactulose (CHRONULAC) 10 GM/15ML solution, TAKE 60 ML BY MOUTH 4 TIMES DAILY., Disp: 1892 mL, Rfl: 11 .  NEXIUM 40 MG capsule, TAKE 1 CAPSULE BY MOUTH EVERY DAY BEFORE BREAKFAST, Disp: 30 capsule, Rfl: 11 .   Polyethyl Glycol-Propyl Glycol (SYSTANE OP), Apply 1 drop to eye as needed (dry eyes)., Disp: , Rfl:  .  polyethylene glycol powder (GLYCOLAX/MIRALAX) powder, DISSOLVE 1 CAPFUL IN WATER TWICE A DAY AS DIRECTED, Disp: 1054 g, Rfl: 0 .  potassium chloride SA (K-DUR,KLOR-CON) 20 MEQ tablet, TAKE 2 TABLETS BY MOUTH EVERY DAY, Disp: 60 tablet, Rfl: 11 .  pseudoephedrine (SUDAFED) 30 MG tablet, Take 30 mg by mouth every 4 (four) hours as needed for congestion., Disp: , Rfl:  .  senna (SENOKOT) 8.6 MG TABS tablet, TAKE 2 TABLETS(17.2 MG) BY MOUTH DAILY. START WITH 1 TABLET (Patient taking differently: TAKE 2 TABLETS(17.2 MG) BY MOUTH DAILY.), Disp: 60 tablet, Rfl: 0 .  sildenafil (VIAGRA) 25 MG tablet, Take 1 tablet (25 mg total) by mouth daily as needed for erectile dysfunction., Disp: 10 tablet, Rfl: 4 .  sodium phosphate (FLEET) enema, Place 1 enema rectally daily. at night, Disp: , Rfl:  .  SSD 1 % cream, Apply 1 application topically daily as needed (Fever blisters.). , Disp: , Rfl: 0 .  TIVICAY 50 MG tablet, TAKE 1 TABLET BY MOUTH ONCE A DAY, Disp: 30 tablet, Rfl: 2 .  traMADol (ULTRAM) 50 MG tablet, TAKE 1 TO 2 TABLETS BY MOUTH Q 4 H PRN P, Disp: , Rfl: 0 .  triamcinolone cream (KENALOG) 0.1 %, APPLY TO AFFECTED AREA DAILY AS NEEDED FOR ITCHING OR RASH, Disp: , Rfl: 1 .  XIFAXAN 550 MG TABS tablet, TAKE 1 TABLET BY MOUTH TWICE DAILY, Disp: 60 tablet, Rfl: 11   Review of Systems  Constitutional: Negative for chills, diaphoresis, fever and unexpected weight change.  HENT: Negative for congestion, rhinorrhea, sinus pressure, sneezing, sore throat and trouble swallowing.   Eyes: Negative for photophobia and visual disturbance.  Respiratory: Negative for cough, chest tightness, shortness of breath, wheezing and stridor.   Cardiovascular: Negative for chest pain, palpitations and leg swelling.  Gastrointestinal: Negative for abdominal distention, abdominal pain, anal bleeding, blood in stool,  constipation, diarrhea, nausea and vomiting.  Genitourinary: Negative for difficulty urinating, dysuria, flank pain and hematuria.  Musculoskeletal: Negative for arthralgias, back pain, joint swelling and myalgias.  Skin: Negative for pallor, rash and wound.  Neurological: Positive for weakness. Negative for dizziness, tremors, seizures and light-headedness.  Hematological: Negative for adenopathy. Does not bruise/bleed easily.  Psychiatric/Behavioral: Negative for agitation, behavioral problems,  decreased concentration and dysphoric mood. The patient is not nervous/anxious.        Objective:   Physical Exam  Constitutional: He is oriented to person, place, and time. He appears well-developed and well-nourished. No distress.  HENT:  Head: Normocephalic and atraumatic.  Mouth/Throat: Oropharynx is clear and moist. No oropharyngeal exudate.  Eyes: Conjunctivae and EOM are normal. Pupils are equal, round, and reactive to light. No scleral icterus.  Neck: Normal range of motion. Neck supple. No JVD present.  Cardiovascular: Normal rate and regular rhythm.   Pulmonary/Chest: Effort normal. No respiratory distress. He has no wheezes.  Abdominal: Soft. There is no tenderness.  Musculoskeletal: He exhibits no edema or tenderness.  Lymphadenopathy:    He has no cervical adenopathy.  Neurological: He is alert and oriented to person, place, and time. No sensory deficit. He exhibits normal muscle tone. Gait normal.  Skin: Skin is warm and dry. He is not diaphoretic. No pallor.     Psychiatric: He has a normal mood and affect. Thought content normal. His speech is delayed. He is slowed.        Assessment & Plan:   HIV: Change to BIKTARVY and RTC to see pharamacy and then to see me, may need PCP prophylaxis, will check VL and CD4 again today   Obesity: this also still plays role in depression  Depression and problems with body image and anxiety: he needs to continue meeting counselors here  and is on benzodiazepene from Dr. Carlean Purl.    Etoh induced cirrhosis with varices: followed by [facility], Dr. Carlean Purl.    I spent greater than 25  minutes with the patient including greater than 50% of time in face to face counsel of the patient re his HIV, Etoh induced cirrhosis, , hepatic encephalopathy, anxiety, and in coordination of his care.

## 2016-11-01 MED FILL — BIKTARVY 50-200-25 MG TABS: 50-200-25 | 30 days supply | Qty: 30 | Fill #1

## 2016-11-24 MED FILL — BIKTARVY 50-200-25 MG TABS: 50-200-25 | 30 days supply | Qty: 30 | Fill #2

## 2016-11-25 ENCOUNTER — Other Ambulatory Visit: Payer: Self-pay | Admitting: Internal Medicine

## 2016-11-25 ENCOUNTER — Telehealth: Payer: Self-pay | Admitting: Infectious Disease

## 2016-11-25 NOTE — Telephone Encounter (Signed)
Please advise [name], thank you. 

## 2016-11-25 NOTE — Telephone Encounter (Signed)
Patient called and asked to speak to Dr Tommy Medal, told patient he was out of the office until 06/25. Patient asked me to give a message to his nurse, has a question about a referral for plastic surgery. Could not remember the name or where they referred him. Asked for a return call.

## 2016-11-25 NOTE — Telephone Encounter (Signed)
Spoke with patient. He was referred to Dr Pascal Lux in [place], [place] by his Primary Care physician.  He will contact her to see if he can be re-referred. Patient states he has missed appointments with  Dr Pascal Lux and is worried he needs referral somewhere else.  He is asking for Dr Tommy Medal to "put in a good word for him." Patient is calling Dr Burnett Sheng to follow up on new referral.

## 2016-11-26 NOTE — Telephone Encounter (Signed)
Faxed the rx to pharmacy for [facility].

## 2016-11-26 NOTE — Telephone Encounter (Signed)
Refill x 3 

## 2016-11-28 NOTE — Telephone Encounter (Signed)
I am not sure what type of referral this is in regards to?

## 2016-11-29 NOTE — Telephone Encounter (Signed)
Ok very good I wondered if that is what this was

## 2016-11-29 NOTE — Telephone Encounter (Signed)
This is his referral for plastic surgery. He understands that his PCP Dr Burnett Sheng is handling it.

## 2016-12-06 ENCOUNTER — Telehealth: Payer: Self-pay

## 2016-12-06 NOTE — Telephone Encounter (Signed)
Spoke with [employer] at [phone] to do the prior authorization for patients nexium.  Patient has tried in the past : Pantoprazole, omeprazole, pepcid.  Gerd dx- K21.9, approval # [id number]   [facility] informed.

## 2016-12-20 ENCOUNTER — Telehealth: Payer: Self-pay

## 2016-12-20 ENCOUNTER — Encounter: Payer: Self-pay | Admitting: Internal Medicine

## 2016-12-20 NOTE — Telephone Encounter (Signed)
Mother in need of last office visit in 12 months, last office visit and future appointments for disability form.   Information given.   Laverle Patter ,RN

## 2016-12-21 ENCOUNTER — Telehealth: Payer: Self-pay | Admitting: *Deleted

## 2016-12-21 NOTE — Telephone Encounter (Signed)
Patient called and left a voice mail for "Dr. Derek Mound personal nurse, Marisue Brooklyn" to call him back. I called him back on his cell # [phone] (the # he left) and I got his voice mail. He did not state the reason he was calling. Advised on voice mail that I was returning his call.

## 2016-12-27 MED FILL — BIKTARVY 50-200-25 MG TABS: 50-200-25 | 30 days supply | Qty: 30 | Fill #3

## 2017-01-04 ENCOUNTER — Telehealth: Payer: Self-pay | Admitting: Pharmacist Clinician (PhC)/ Clinical Pharmacy Specialist

## 2017-01-04 NOTE — Telephone Encounter (Signed)
Called Terry back to bring him back for labs after a switch to [employer]. Scheduled to come back next Wed.

## 2017-01-07 ENCOUNTER — Telehealth: Payer: Self-pay | Admitting: Internal Medicine

## 2017-01-07 NOTE — Telephone Encounter (Signed)
Attempted to return call to the patient.  No answer/machine

## 2017-01-07 NOTE — Telephone Encounter (Signed)
Mom notified that he has an appt with Dr. Carlean Purl on 02/15/17.  No scheduled procedures or imaging tests have been arranged by our office.

## 2017-01-12 ENCOUNTER — Ambulatory Visit (INDEPENDENT_AMBULATORY_CARE_PROVIDER_SITE_OTHER): Payer: Medicaid Other | Admitting: Pharmacist Clinician (PhC)/ Clinical Pharmacy Specialist

## 2017-01-12 DIAGNOSIS — Z23 Encounter for immunization: Secondary | ICD-10-CM

## 2017-01-12 DIAGNOSIS — B20 Human immunodeficiency virus [HIV] disease: Secondary | ICD-10-CM

## 2017-01-12 NOTE — Patient Instructions (Signed)
Continue Biktarvy daily Continue back to see Dr. Tommy Medal in Oct
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
## 2017-01-13 LAB — COMPLETE METABOLIC PANEL WITHOUT GFR
ALT: 17 U/L (ref 9–46)
AST: 28 U/L (ref 10–35)
Albumin: 4.1 g/dL (ref 3.6–5.1)
Alkaline Phosphatase: 152 U/L — ABNORMAL HIGH (ref 40–115)
BUN: 9 mg/dL (ref 7–25)
CO2: 19 mmol/L — ABNORMAL LOW (ref 20–31)
Calcium: 9 mg/dL (ref 8.6–10.3)
Chloride: 105 mmol/L (ref 98–110)
Creat: 0.88 mg/dL (ref 0.70–1.33)
GFR, Est African American: >89 mL/min
GFR, Est Non African American: >89 mL/min
Glucose, Bld: 95 mg/dL (ref 65–99)
Potassium: 3.6 mmol/L (ref 3.5–5.3)
Sodium: 140 mmol/L (ref 135–146)
Total Bilirubin: 1.2 mg/dL (ref 0.2–1.2)
Total Protein: 6.7 g/dL (ref 6.1–8.1)

## 2017-01-13 LAB — T-HELPER CELL (CD4) - (RCID CLINIC ONLY)
CD4 % Helper T Cell: 27 % — ABNORMAL LOW (ref 33–55)
CD4 T Cell Abs: 220 /uL — ABNORMAL LOW (ref 400–2700)

## 2017-01-15 LAB — HIV-1 RNA QUANT-NO REFLEX-BLD
HIV 1 RNA QUANT: 73 {copies}/mL — AB
HIV-1 RNA QUANT, LOG: 1.86 {Log_copies}/mL — AB

## 2017-01-21 MED FILL — BIKTARVY 50-200-25 MG TABS: 50-200-25 | 30 days supply | Qty: 30 | Fill #4

## 2017-02-03 ENCOUNTER — Telehealth: Payer: Self-pay | Admitting: Internal Medicine

## 2017-02-03 NOTE — Telephone Encounter (Signed)
Patient fell and needs a shoulder surgery in September.  He saw Dr. Mia Creek in [place] and they are needing permission for anesthesia.  [phone].  Patient's mom is notified that we need more information.  Will he need general anesthesia, how long is the planned surgery, and what is being done.  She will get more information and call us back.

## 2017-02-10 ENCOUNTER — Telehealth: Payer: Self-pay | Admitting: Internal Medicine

## 2017-02-10 NOTE — Telephone Encounter (Signed)
Patient has a consult today at [facility] for his shoulder.  His parents are reluctant to have him go for a consult until patient sees Dr. Carlean Purl next week.  He asked that I talk to his parents about consult.  I spoke to his mother Enid Derry and advised her that today's appt is just a consult and he can bring back any potential surgery plans to Dr. Carlean Purl on Tuesday when he comes for the appt then.  She thanked me for the call.

## 2017-02-15 ENCOUNTER — Other Ambulatory Visit (INDEPENDENT_AMBULATORY_CARE_PROVIDER_SITE_OTHER): Payer: Medicaid Other

## 2017-02-15 ENCOUNTER — Ambulatory Visit (INDEPENDENT_AMBULATORY_CARE_PROVIDER_SITE_OTHER): Payer: Medicaid Other | Admitting: Internal Medicine

## 2017-02-15 ENCOUNTER — Encounter: Payer: Self-pay | Admitting: Internal Medicine

## 2017-02-15 VITALS — BP 128/76 | HR 80 | Ht 70.75 in | Wt 293.4 lb

## 2017-02-15 DIAGNOSIS — K7030 Alcoholic cirrhosis of liver without ascites: Secondary | ICD-10-CM | POA: Diagnosis not present

## 2017-02-15 DIAGNOSIS — K7682 Hepatic encephalopathy: Secondary | ICD-10-CM

## 2017-02-15 DIAGNOSIS — I8510 Secondary esophageal varices without bleeding: Secondary | ICD-10-CM

## 2017-02-15 DIAGNOSIS — M25512 Pain in left shoulder: Secondary | ICD-10-CM

## 2017-02-15 DIAGNOSIS — K7290 Hepatic failure, unspecified without coma: Secondary | ICD-10-CM

## 2017-02-15 LAB — CBC WITH DIFFERENTIAL/PLATELET
BASOS ABS: 0 10*3/uL (ref 0.0–0.1)
Basophils Relative: 0.5 % (ref 0.0–3.0)
Eosinophils Absolute: 0.1 10*3/uL (ref 0.0–0.7)
Eosinophils Relative: 2.6 % (ref 0.0–5.0)
HCT: 44.3 % (ref 39.0–52.0)
Hemoglobin: 14.4 g/dL (ref 13.0–17.0)
LYMPHS ABS: 1.5 10*3/uL (ref 0.7–4.0)
Lymphocytes Relative: 29.3 % (ref 12.0–46.0)
MCHC: 32.5 g/dL (ref 30.0–36.0)
MCV: 84.5 fl (ref 78.0–100.0)
MONO ABS: 0.4 10*3/uL (ref 0.1–1.0)
Monocytes Relative: 8.2 % (ref 3.0–12.0)
NEUTROS ABS: 3.1 10*3/uL (ref 1.4–7.7)
NEUTROS PCT: 59.4 % (ref 43.0–77.0)
PLATELETS: 123 10*3/uL — AB (ref 150.0–400.0)
RBC: 5.25 Mil/uL (ref 4.22–5.81)
RDW: 18.3 % — ABNORMAL HIGH (ref 11.5–15.5)
WBC: 5.3 10*3/uL (ref 4.0–10.5)

## 2017-02-15 LAB — AMMONIA: Ammonia: 58 umol/L — ABNORMAL HIGH (ref 11–35)

## 2017-02-15 LAB — PROTIME-INR
INR: 1.2 ratio — ABNORMAL HIGH (ref 0.8–1.0)
Prothrombin Time: 13.2 s — ABNORMAL HIGH (ref 9.6–13.1)

## 2017-02-15 MED ORDER — AMITRIPTYLINE HCL 100 MG PO TABS: ORAL_TABLET | ORAL | 5 refills | Status: DC

## 2017-02-15 MED ORDER — ALPRAZOLAM 1 MG PO TABS: 1.0000 mg | ORAL_TABLET | Freq: Two times a day (BID) | ORAL | 2 refills | Status: DC

## 2017-02-15 NOTE — Assessment & Plan Note (Addendum)
Stable. Recheck INR and a CBC today. I will characterize his child class status after that. If he needs surgery he certainly at higher risk of problems but I suspect he would tolerate it. Further plans pending the lab results. Hopefully he won't need surgery for his shoulder. I reemphasized the need to abstain from alcohol. No medication change. Routine follow-up in about 6 months.

## 2017-02-15 NOTE — Assessment & Plan Note (Addendum)
EGD this yr - wait on shoulder evaluation and treatment The past several years he's had small varices that I have not banded. Has portal gastropathy also. Not a good candidate for beta blockade

## 2017-02-15 NOTE — Assessment & Plan Note (Signed)
Stable, doing well we'll check an ammonia level today for the past few visits Andric has been as good as he has been with this

## 2017-02-15 NOTE — Addendum Note (Signed)
Addended by: [place], Kya Mayfield E on: 02/15/2017 01:50 PM   Modules accepted: Orders
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
## 2017-02-15 NOTE — Patient Instructions (Signed)
  Please call us after your [facility] visit to update [place].   Your physician has requested that you go to the basement for the following lab work before leaving today: CBC/diff, ammonia, PT/INR   You will need to have an EGD at the hospital in the future, we will arrange that later on.   We are supplying you with a letter for Mom to go to [facility] with you.    I appreciate the opportunity to care for you. Ronney Lion, [facility]

## 2017-02-21 ENCOUNTER — Other Ambulatory Visit: Payer: Self-pay

## 2017-02-21 ENCOUNTER — Other Ambulatory Visit: Payer: Self-pay | Admitting: Internal Medicine

## 2017-02-21 MED ORDER — LACTULOSE 10 GM/15ML PO SOLN: ORAL | 11 refills | Status: DC

## 2017-02-21 MED FILL — BIKTARVY 50-200-25 MG TABS: 50-200-25 | 30 days supply | Qty: 30 | Fill #5

## 2017-02-21 NOTE — Telephone Encounter (Signed)
How many refills [name]? 

## 2017-02-21 NOTE — Telephone Encounter (Signed)
Refill 6 months 

## 2017-02-21 NOTE — Progress Notes (Signed)
Labs look okay. Please find out how his visit to the orthopedist in [place] went.

## 2017-02-21 NOTE — Progress Notes (Signed)
Thank you. Please arrange for him to have an EGD during my Hospital week in October. Follow-up esophageal varices. Needs Mac. Try to avoid Monday or Friday at this point. [facility] is okay

## 2017-03-14 ENCOUNTER — Other Ambulatory Visit: Payer: Self-pay | Admitting: Internal Medicine

## 2017-03-14 NOTE — Telephone Encounter (Signed)
May we refill [name]? 

## 2017-03-15 ENCOUNTER — Telehealth: Payer: Self-pay | Admitting: Internal Medicine

## 2017-03-15 NOTE — Telephone Encounter (Signed)
Patient informed that xanax faxed to [facility] in [place] 20 minutes ago.  He said thank you.

## 2017-03-15 NOTE — Telephone Encounter (Signed)
Refill x 3 

## 2017-03-15 NOTE — Telephone Encounter (Signed)
Faxed to [facility] in [place].

## 2017-03-29 ENCOUNTER — Ambulatory Visit (AMBULATORY_SURGERY_CENTER): Payer: Self-pay

## 2017-03-29 VITALS — Ht 72.0 in | Wt 314.2 lb

## 2017-03-29 DIAGNOSIS — I8510 Secondary esophageal varices without bleeding: Secondary | ICD-10-CM

## 2017-03-29 DIAGNOSIS — K7460 Unspecified cirrhosis of liver: Secondary | ICD-10-CM

## 2017-03-29 NOTE — Progress Notes (Signed)
Per pt, no allergies to soy or egg products.Pt not taking any weight loss meds or using  O2 at home.   Pt refused Emmi video. 

## 2017-03-30 MED FILL — BIKTARVY 50-200-25 MG TABS: 50-200-25 | 30 days supply | Qty: 30 | Fill #6

## 2017-04-04 ENCOUNTER — Encounter (HOSPITAL_COMMUNITY): Payer: Self-pay

## 2017-04-04 ENCOUNTER — Ambulatory Visit: Payer: Medicaid Other | Admitting: Infectious Disease

## 2017-04-06 ENCOUNTER — Encounter (HOSPITAL_COMMUNITY)
Admission: RE | Disposition: A | Discharge status: Home / Self Care | Payer: Self-pay | Source: Ambulatory Visit | Attending: Internal Medicine

## 2017-04-06 ENCOUNTER — Ambulatory Visit (HOSPITAL_COMMUNITY)
Admission: RE | Admit: 2017-04-06 | Discharge: 2017-04-06 | Disposition: A | Discharge status: Home / Self Care | Payer: Medicaid Other | Source: Ambulatory Visit | Attending: Internal Medicine | Admitting: Internal Medicine

## 2017-04-06 ENCOUNTER — Encounter (HOSPITAL_COMMUNITY): Payer: Self-pay | Admitting: *Deleted

## 2017-04-06 ENCOUNTER — Ambulatory Visit (HOSPITAL_COMMUNITY): Payer: Medicaid Other | Admitting: Anesthesiology

## 2017-04-06 DIAGNOSIS — I8500 Esophageal varices without bleeding: Secondary | ICD-10-CM

## 2017-04-06 DIAGNOSIS — I8511 Secondary esophageal varices with bleeding: Secondary | ICD-10-CM

## 2017-04-06 DIAGNOSIS — K219 Gastro-esophageal reflux disease without esophagitis: Secondary | ICD-10-CM | POA: Insufficient documentation

## 2017-04-06 DIAGNOSIS — K7030 Alcoholic cirrhosis of liver without ascites: Secondary | ICD-10-CM | POA: Insufficient documentation

## 2017-04-06 DIAGNOSIS — Z85828 Personal history of other malignant neoplasm of skin: Secondary | ICD-10-CM | POA: Diagnosis not present

## 2017-04-06 DIAGNOSIS — Z21 Asymptomatic human immunodeficiency virus [HIV] infection status: Secondary | ICD-10-CM | POA: Diagnosis not present

## 2017-04-06 DIAGNOSIS — Z880 Allergy status to penicillin: Secondary | ICD-10-CM | POA: Diagnosis not present

## 2017-04-06 DIAGNOSIS — F1020 Alcohol dependence, uncomplicated: Secondary | ICD-10-CM | POA: Insufficient documentation

## 2017-04-06 DIAGNOSIS — E785 Hyperlipidemia, unspecified: Secondary | ICD-10-CM | POA: Insufficient documentation

## 2017-04-06 DIAGNOSIS — Z79899 Other long term (current) drug therapy: Secondary | ICD-10-CM | POA: Insufficient documentation

## 2017-04-06 DIAGNOSIS — Z8719 Personal history of other diseases of the digestive system: Secondary | ICD-10-CM | POA: Insufficient documentation

## 2017-04-06 DIAGNOSIS — I8510 Secondary esophageal varices without bleeding: Secondary | ICD-10-CM | POA: Diagnosis not present

## 2017-04-06 DIAGNOSIS — Z882 Allergy status to sulfonamides status: Secondary | ICD-10-CM | POA: Insufficient documentation

## 2017-04-06 DIAGNOSIS — R531 Weakness: Secondary | ICD-10-CM | POA: Diagnosis not present

## 2017-04-06 DIAGNOSIS — F419 Anxiety disorder, unspecified: Secondary | ICD-10-CM | POA: Insufficient documentation

## 2017-04-06 DIAGNOSIS — K766 Portal hypertension: Secondary | ICD-10-CM | POA: Diagnosis not present

## 2017-04-06 DIAGNOSIS — K7290 Hepatic failure, unspecified without coma: Secondary | ICD-10-CM | POA: Diagnosis not present

## 2017-04-06 DIAGNOSIS — Z6841 Body Mass Index (BMI) 40.0 and over, adult: Secondary | ICD-10-CM | POA: Insufficient documentation

## 2017-04-06 DIAGNOSIS — Z87891 Personal history of nicotine dependence: Secondary | ICD-10-CM | POA: Insufficient documentation

## 2017-04-06 DIAGNOSIS — I10 Essential (primary) hypertension: Secondary | ICD-10-CM | POA: Diagnosis not present

## 2017-04-06 DIAGNOSIS — N529 Male erectile dysfunction, unspecified: Secondary | ICD-10-CM | POA: Insufficient documentation

## 2017-04-06 DIAGNOSIS — F329 Major depressive disorder, single episode, unspecified: Secondary | ICD-10-CM | POA: Diagnosis not present

## 2017-04-06 DIAGNOSIS — K3189 Other diseases of stomach and duodenum: Secondary | ICD-10-CM | POA: Insufficient documentation

## 2017-04-06 HISTORY — PX: ESOPHAGOGASTRODUODENOSCOPY (EGD) WITH PROPOFOL: SHX5813

## 2017-04-06 SURGERY — ESOPHAGOGASTRODUODENOSCOPY (EGD) WITH PROPOFOL
Anesthesia: Monitor Anesthesia Care

## 2017-04-06 MED ORDER — PROPOFOL 500 MG/50ML IV EMUL
INTRAVENOUS | Status: DC | PRN
  Administered 2017-04-06: 75 ug/kg/min via INTRAVENOUS

## 2017-04-06 MED ORDER — ONDANSETRON HCL 4 MG/2ML IJ SOLN
INTRAMUSCULAR | Status: DC | PRN
  Administered 2017-04-06: 4 mg via INTRAVENOUS

## 2017-04-06 MED ORDER — LACTATED RINGERS IV SOLN: INTRAVENOUS | Status: DC

## 2017-04-06 MED ORDER — LACTATED RINGERS IV SOLN
INTRAVENOUS | Status: DC
  Administered 2017-04-06: 1000 mL via INTRAVENOUS

## 2017-04-06 MED ORDER — PROPOFOL 10 MG/ML IV BOLUS
INTRAVENOUS | Status: DC | PRN
  Administered 2017-04-06: 30 mg via INTRAVENOUS
  Administered 2017-04-06: 50 mg via INTRAVENOUS
  Administered 2017-04-06 (×2): 30 mg via INTRAVENOUS

## 2017-04-06 MED ORDER — PROPOFOL 10 MG/ML IV BOLUS
INTRAVENOUS | Status: AC
  Filled 2017-04-06: qty 40

## 2017-04-06 MED ORDER — ONDANSETRON HCL 4 MG/2ML IJ SOLN
INTRAMUSCULAR | Status: AC
  Filled 2017-04-06: qty 2

## 2017-04-06 SURGICAL SUPPLY — 14 items

## 2017-04-06 NOTE — Op Note (Signed)
[facility] Patient Name: [name] Procedure Date: 04/06/2017 MRN: [id number] Attending MD: Gatha Mayer , MD Date of Birth: Jan 08, 1966 CSN: [id number] Age: 51 Admit Type: Outpatient Procedure:                Upper GI endoscopy Indications:              Follow-up of esophageal varices Providers:                Gatha Mayer, MD, Cleda Daub, RN, William Dalton, Technician Referring MD:              Medicines:                Propofol per Anesthesia, Monitored Anesthesia Care Complications:            No immediate complications. Estimated Blood Loss:     Estimated blood loss: none. Procedure:                Pre-Anesthesia Assessment:                           - Prior to the procedure, a History and Physical                            was performed, and patient medications and                            allergies were reviewed. The patient's tolerance of                            previous anesthesia was also reviewed. The risks                            and benefits of the procedure and the sedation                            options and risks were discussed with the patient.                            All questions were answered, and informed consent                            was obtained. Prior Anticoagulants: The patient has                            taken no previous anticoagulant or antiplatelet                            agents. ASA Grade Assessment: III - A patient with                            severe systemic disease. After reviewing the risks  and benefits, the patient was deemed in                            satisfactory condition to undergo the procedure.                           After obtaining informed consent, the endoscope was                            passed under direct vision. Throughout the                            procedure, the patient's blood pressure, pulse, and                  oxygen saturations were monitored continuously. The                            EG-[id number] ([id number]) scope was introduced through the                            mouth, and advanced to the second part of duodenum.                            The upper GI endoscopy was accomplished without                            difficulty. The patient tolerated the procedure                            well. Scope In: Scope Out: Findings:      Grade I varices were found in the lower third of the esophagus. They       were 3 mm in largest diameter. Estimated blood loss: none.      The entire examined stomach was normal.      The examined duodenum was normal.      The cardia and gastric fundus were normal on retroflexion. Impression:               - Grade I esophageal varices.                           - Normal stomach.                           - Normal examined duodenum.                           - No specimens collected. Moderate Sedation:      Please see anesthesia notes, moderate sedation not given Recommendation:           - Patient has a contact number available for                            emergencies. The signs and symptoms of potential  delayed complications were discussed with the                            patient. Return to normal activities tomorrow.                            Written discharge instructions were provided to the                            patient.                           - Resume previous diet.                           - Continue present medications.                           - Repeat upper endoscopy in 1 year for surveillance.                           - Return to my office in 3 months. Procedure Code(s):        --- Professional ---                           [phone], Esophagogastroduodenoscopy, flexible,                            transoral; diagnostic, including collection of                            specimen(s) by brushing or  washing, when performed                            (separate procedure) Diagnosis Code(s):        --- Professional ---                           I85.00, Esophageal varices without bleeding CPT copyright 2016 American Medical Association. All rights reserved. The codes documented in this report are preliminary and upon coder review may  be revised to meet current compliance requirements. Gatha Mayer, MD 04/06/2017 9:09:32 AM This report has been signed electronically. Number of Addenda: 0

## 2017-04-06 NOTE — H&P (Signed)
[facility] History and Physical   Primary Care Physician:  Greig Right, MD   Reason for Procedure:   F/u varices  Plan:    Upper endoscopy - possible variceal ligation The risks and benefits as well as alternatives of endoscopic procedure(s) have been discussed and reviewed. All questions answered. The patient agrees to proceed.      HPI: [name] is a 51 y.o. male w/ hx cirrhosis and bleeding esophageal varices here for surveillance endoscopy. Liver disease is stable.   Past Medical History:  Diagnosis Date  . Alcoholic cirrhosis of liver ([place])   . Alcoholism, chronic ([place])   . ANXIETY 06/23/2006  . Ascites 11/13/2009  . Ascites   . Avulsion fracture of middle phalanges of  3rd/4th fingers left hand 01/07/2014  . Blood dyscrasia    HIV  . Cellulitis of right lower extremity 03/02/2016  . DEPRESSION 06/23/2006  . 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 Erectile dysfunction 07/14/2015  . 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 Gastric ulcer 06/2010  . GERD (gastroesophageal reflux disease)   . HIV DISEASE 06/23/2006  . HYPERLIPIDEMIA 06/23/2006  . HYPERTENSION 06/23/2006   denies  . 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 Iron deficiency anemia   . Left foot infection   . Obesity (BMI 30-39.9)    BMI 34 kg/m^2  . Portal hypertensive gastropathy ([place]) 01/02/2013  . Recent shoulder injury    left shoulder/fell in parking lot/ no surgery/December 26, 2016  . SBP (spontaneous bacterial peritonitis) ([place]) 05/03/2011   Suspected by high leukocytes on paracentesis. Clinical scenario also compatible. November 2012 responded to Levaquin. Started on trimethoprim-sulfamethoxazole double strength daily for prophylaxis.   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 Skin cancer    left calf  . STRAIN, CHEST WALL 03/15/2007  . Varices, esophageal ([place]) 06/2010  . Venous stasis 03/02/2016    Past Surgical History:  Procedure Laterality Date  .  CARPAL TUNNEL RELEASE     left  . COLONOSCOPY  march 2013  . ESOPHAGEAL BANDING  05/01/2013   Procedure: ESOPHAGEAL BANDING;  Surgeon: Gatha Mayer, MD;  Location: [facility];  Service: Endoscopy;;  . ESOPHAGOGASTRODUODENOSCOPY  07/01/2010;  08/12/10   small varices, gastric ulcer  . ESOPHAGOGASTRODUODENOSCOPY  10/26/2011   Procedure: ESOPHAGOGASTRODUODENOSCOPY (EGD);  Surgeon: Gatha Mayer, MD;  Location: [facility];  Service: Endoscopy;  Laterality: N/A;  . ESOPHAGOGASTRODUODENOSCOPY N/A 01/02/2013   Procedure: ESOPHAGOGASTRODUODENOSCOPY (EGD);  Surgeon: Gatha Mayer, MD;  Location: [facility];  Service: Endoscopy;  Laterality: N/A;  . ESOPHAGOGASTRODUODENOSCOPY N/A 04/23/2013   Procedure: ESOPHAGOGASTRODUODENOSCOPY (EGD);  Surgeon: Gatha Mayer, MD;  Location: [facility];  Service: Endoscopy;  Laterality: N/A;  . ESOPHAGOGASTRODUODENOSCOPY N/A 05/01/2013   Procedure: ESOPHAGOGASTRODUODENOSCOPY (EGD);  Surgeon: Gatha Mayer, MD;  Location: [facility];  Service: Endoscopy;  Laterality: N/A;  follow-up varices and possibly band them  . ESOPHAGOGASTRODUODENOSCOPY N/A 09/04/2013   Procedure: ESOPHAGOGASTRODUODENOSCOPY (EGD);  Surgeon: Gatha Mayer, MD;  Location: [facility];  Service: Endoscopy;  Laterality: N/A;  . ESOPHAGOGASTRODUODENOSCOPY N/A 09/10/2014   Procedure: ESOPHAGOGASTRODUODENOSCOPY (EGD);  Surgeon: Gatha Mayer, MD;  Location: [facility];  Service: Endoscopy;  Laterality: N/A;  . ESOPHAGOGASTRODUODENOSCOPY (EGD) WITH PROPOFOL N/A 08/21/2015   Procedure: ESOPHAGOGASTRODUODENOSCOPY (EGD) WITH PROPOFOL;  Surgeon: Gatha Mayer, MD;  Location: [facility];  Service: Endoscopy;  Laterality: N/A;  . ESOPHAGOGASTRODUODENOSCOPY W/ BANDING  06/26/2010   variceal ligation  . GASTRIC VARICES BANDING N/A 01/02/2013   Procedure: GASTRIC  VARICES BANDING;  Surgeon: Gatha Mayer, MD;  Location: [facility];  Service: Endoscopy;  Laterality: N/A;  possible banding  . UPPER  GASTROINTESTINAL ENDOSCOPY      Prior to Admission medications   Medication Sig Start Date End Date Taking? Authorizing Provider  ALPRAZolam (XANAX) 1 MG tablet TAKE 1 TABLET BY MOUTH TWICE DAILY Patient taking differently: TAKE 0.5 TABLET (0.5 MG) BY MOUTH TWICE DAILY 03/15/17  Yes Gatha Mayer, MD  AMBULATORY NON [facility] MEDICATION Lift Chair- Use as needed Dx: generalized weakness, hepatic encephalopathy 01/23/15  Yes Zehr, Laban Emperor, PA-C  aMILoride (MIDAMOR) 5 MG tablet TAKE 3 TABLETS(15 MG) BY MOUTH DAILY 09/06/16  Yes Gatha Mayer, MD  amitriptyline (ELAVIL) 100 MG tablet TAKE 1 TABLET(100 MG) BY MOUTH AT BEDTIME 02/15/17  Yes Gatha Mayer, MD  bictegravir-emtricitabine-tenofovir AF (BIKTARVY) 50-200-25 MG TABS tablet Take 1 tablet by mouth daily. 10/05/16  Yes Tommy Medal, Lavell Islam, MD  CONSTULOSE 10 GM/15ML solution TAKE 60 ML BY MOUTH 4 TIMES DAILY 02/21/17  Yes Gatha Mayer, MD  Docusate Sodium (GNP STOOL SOFTENER EX ST PO) Take 1 tablet by mouth at bedtime.   Yes [provider]  esomeprazole (NEXIUM) 40 MG capsule Take 1 capsule (40 mg total) by mouth daily before breakfast. 01/03/15  Yes Gatha Mayer, MD  furosemide (LASIX) 40 MG tablet TAKE 2 TABLETS(80 MG) BY MOUTH TWICE DAILY 09/16/16  Yes Gatha Mayer, MD  guaiFENesin (MUCINEX) 600 MG 12 hr tablet Take 600 mg by mouth 2 (two) times daily as needed (for congestion or cough).    Yes [provider]  NEXIUM 40 MG capsule TAKE 1 CAPSULE BY MOUTH EVERY DAY BEFORE BREAKFAST 08/09/16  Yes Gatha Mayer, MD  Polyethyl Glycol-Propyl Glycol (SYSTANE OP) Place 1 drop into both eyes 3 (three) times daily as needed (3).    Yes [provider]  potassium chloride SA (K-DUR,KLOR-CON) 20 MEQ tablet TAKE 2 TABLETS BY MOUTH EVERY DAY Patient taking differently: TAKE 1 TABLETS BY MOUTH TWICE DAILY 09/13/16  Yes Gatha Mayer, MD  senna (SENOKOT) 8.6 MG TABS tablet TAKE 2 TABLETS(17.2 MG) BY MOUTH DAILY. START  WITH 1 TABLET Patient taking differently: TAKE 1 TABLETS(17.2 MG) BY MOUTH TWICE DAILY. START WITH 1 TABLET 08/08/15  Yes Gatha Mayer, MD  sildenafil (VIAGRA) 25 MG tablet Take 1 tablet (25 mg total) by mouth daily as needed for erectile dysfunction. 11/03/15  Yes Tommy Medal, Lavell Islam, MD  Simethicone (GAS-X PO) Take 1-2 tablets by mouth 3 (three) times daily as needed (for flatulence).    Yes [provider]  sodium phosphate (FLEET) enema Place 1 enema rectally at bedtime as needed (for constipation.).    Yes [provider]  SSD 1 % cream Apply 1 application topically daily as needed (Fever blisters.).  07/02/14  Yes [provider]  traMADol (ULTRAM) 50 MG tablet Take 50 mg by mouth every 4 (four) hours as needed. For pain. 01/13/17  Yes [provider]  triamcinolone cream (KENALOG) 0.1 % APPLY TO AFFECTED AREA DAILY AS NEEDED FOR ITCHING OR RASH 12/18/14  Yes [provider]  Wheat Dextrin (BENEFIBER PO) Take 1 Dose by mouth daily as needed (for constipation.).   Yes [provider]  XIFAXAN 550 MG TABS tablet TAKE 1 TABLET BY MOUTH TWICE DAILY 06/25/16  Yes Gatha Mayer, MD    Current Facility-Administered Medications  Medication Dose Route Frequency Provider Last Rate Last  Dose  . lactated ringers infusion   Intravenous Continuous Gatha Mayer, MD 10 mL/hr at 04/06/17 0811 1,000 mL at 04/06/17 0811  . lactated ringers infusion   Intravenous Continuous Gatha Mayer, MD        Allergies as of 02/21/2017 - Review Complete 02/15/2017  Allergen Reaction Noted  . Penicillins    . Sulfa antibiotics  08/21/2015    Family History  Problem Relation Age of Onset  . Hyperlipidemia Mother   . Hypertension Mother   . Breast cancer Mother        questionable  . Heart disease Mother   . Hypertension Father   . Irritable bowel syndrome Father   . Drug abuse Brother   . Heart disease Brother   . Breast cancer Maternal Aunt         maternal great aunt  . Alcohol abuse Other   . Heart disease Maternal Uncle   . Irritable bowel syndrome Paternal Aunt   . Colon cancer Neg Hx     Social History   Social History  . Marital status: Single    Spouse name: N/A  . Number of children: N/A  . Years of education: N/A   Occupational History  . disability    Social History Main Topics  . Smoking status: Former Smoker    Packs/day: 0.10    Years: 10.00    Types: Cigarettes    Start date: 03/03/2014    Quit date: 04/03/2014  . Smokeless tobacco: Never Used     Comment: Chews nicorette gum.  Marland Kitchen Alcohol use No     Comment: pt is an alcoholic currently in [employer]  . Drug use: No  . Sexual activity: Yes    Partners: Male    Birth control/ protection: Condom     Comment: pt. declined condoms   Other Topics Concern  . Not on file   Social History Narrative   Single, disabled hair stylist   Lives with parents in a home with a basement.  Parents do not like for him to go down stairs because they are steep.               Review of Systems: Positive for anxiety, shoulder pain All other review of systems negative except as mentioned in the HPI.  Physical Exam: Vital signs in last 24 hours: Temp:  [98 F (36.7 C)] 98 F (36.7 C) (10/24 0756) Pulse Rate:  [75] 75 (10/24 0756) Resp:  [15] 15 (10/24 0756) SpO2:  [99 %] 99 % (10/24 0756) Weight:  [314 lb (142.4 kg)] 314 lb (142.4 kg) (10/24 0756)   General:   Alert,  Well-developed, well-nourished, pleasant and cooperative in NAD Lungs:  Clear throughout to auscultation.   Heart:  Regular rate and rhythm; no murmurs, clicks, rubs,  or gallops. Abdomen:  Soft, nontender and nondistended. Normal bowel sounds.   Neuro/Psych:  Alert and cooperative. Normal mood and affect. A and O x 3   @Carl Vanschaick  Simonne Maffucci, MD, [facility] [phone] (pager) 04/06/2017 8:16 AM@

## 2017-04-06 NOTE — Anesthesia Postprocedure Evaluation (Signed)
Anesthesia Post Note  Patient: [name]  Procedure(s) Performed: ESOPHAGOGASTRODUODENOSCOPY (EGD) WITH PROPOFOL (N/A ) ESOPHAGEAL BANDING (N/A )     Patient location during evaluation: Endoscopy Anesthesia Type: MAC Level of consciousness: awake and alert Pain management: pain level controlled Vital Signs Assessment: post-procedure vital signs reviewed and stable Respiratory status: spontaneous breathing, nonlabored ventilation, respiratory function stable and patient connected to nasal cannula oxygen Cardiovascular status: stable and blood pressure returned to baseline Postop Assessment: no apparent nausea or vomiting Anesthetic complications: no    Last Vitals:  Vitals:   04/06/17 0900 04/06/17 0910  BP: (!) 189/95 (!) 149/81  Pulse: 72 70  Resp: 13 14  Temp:    SpO2: 99% 99%    Last Pain:  Vitals:   04/06/17 0856  TempSrc: Oral                 Montez Hageman

## 2017-04-06 NOTE — Transfer of Care (Signed)
Immediate Anesthesia Transfer of Care Note  Patient: [name]  Procedure(s) Performed: ESOPHAGOGASTRODUODENOSCOPY (EGD) WITH PROPOFOL (N/A ) ESOPHAGEAL BANDING (N/A )  Patient Location: PACU  Anesthesia Type:MAC  Level of Consciousness: sedated  Airway & Oxygen Therapy: Patient Spontanous Breathing and Patient connected to nasal cannula oxygen  Post-op Assessment: Report given to RN and Post -op Vital signs reviewed and stable  Post vital signs: Reviewed and stable  Last Vitals:  Vitals:   04/06/17 0756  Pulse: 75  Resp: 15  Temp: 36.7 C  SpO2: 99%    Last Pain:  Vitals:   04/06/17 0756  TempSrc: Oral         Complications: No apparent anesthesia complications

## 2017-04-06 NOTE — Discharge Instructions (Signed)
YOU HAD AN ENDOSCOPIC PROCEDURE TODAY: Refer to the procedure report and other information in the discharge instructions given to you for any specific questions about what was found during the examination. If this information does not answer your questions, please call [place] office at [phone] to clarify.  ° °YOU SHOULD EXPECT: Some feelings of bloating in the abdomen. Passage of more gas than usual. Walking can help get rid of the air that was put into your GI tract during the procedure and reduce the bloating. If you had a lower endoscopy (such as a colonoscopy or flexible sigmoidoscopy) you may notice spotting of blood in your stool or on the toilet paper. Some abdominal soreness may be present for a day or two, also. ° °DIET: Your first meal following the procedure should be a light meal and then it is ok to progress to your normal diet. A half-sandwich or bowl of soup is an example of a good first meal. Heavy or fried foods are harder to digest and may make you feel nauseous or bloated. Drink plenty of fluids but you should avoid alcoholic beverages for 24 hours. If you had a esophageal dilation, please see attached instructions for diet.   ° °ACTIVITY: Your care partner should take you home directly after the procedure. You should plan to take it easy, moving slowly for the rest of the day. You can resume normal activity the day after the procedure however YOU SHOULD NOT DRIVE, use power tools, machinery or perform tasks that involve climbing or major physical exertion for 24 hours (because of the sedation medicines used during the test).  ° °SYMPTOMS TO REPORT IMMEDIATELY: °A gastroenterologist can be reached at any hour. Please call [phone]  for any of the following symptoms:  ° °Following upper endoscopy (EGD, EUS, ERCP, esophageal dilation) °Vomiting of blood or coffee ground material  °New, significant abdominal pain  °New, significant chest pain or pain under the shoulder blades  °Painful or  persistently difficult swallowing  °New shortness of breath  °Black, tarry-looking or red, bloody stools ° °FOLLOW UP:  °If any biopsies were taken you will be contacted by phone or by letter within the next 1-3 weeks. Call [phone]  if you have not heard about the biopsies in 3 weeks.  °Please also call with any specific questions about appointments or follow up tests. ° ° °Monitored Anesthesia Care, Care After °These instructions provide you with information about caring for yourself after your procedure. Your health care provider may also give you more specific instructions. Your treatment has been planned according to current medical practices, but problems sometimes occur. Call your health care provider if you have any problems or questions after your procedure. °What can I expect after the procedure? °After your procedure, it is common to: °· Feel sleepy for several hours. °· Feel clumsy and have poor balance for several hours. °· Feel forgetful about what happened after the procedure. °· Have poor judgment for several hours. °· Feel nauseous or vomit. °· Have a sore throat if you had a breathing tube during the procedure. ° °Follow these instructions at home: °For at least 24 hours after the procedure: ° °· Do not: °? Participate in activities in which you could fall or become injured. °? Drive. °? Use heavy machinery. °? Drink alcohol. °? Take sleeping pills or medicines that cause drowsiness. °? Make important decisions or sign legal documents. °? Take care of children on your own. °· Rest. °Eating and drinking °· Follow   the diet that is recommended by your health care provider. °· If you vomit, drink water, juice, or soup when you can drink without vomiting. °· Make sure you have little or no nausea before eating solid foods. °General instructions °· Have a responsible adult stay with you until you are awake and alert. °· Take over-the-counter and prescription medicines only as told by your health care  provider. °· If you smoke, do not smoke without supervision. °· Keep all follow-up visits as told by your health care provider. This is important. °Contact a health care provider if: °· You keep feeling nauseous or you keep vomiting. °· You feel light-headed. °· You develop a rash. °· You have a fever. °Get help right away if: °· You have trouble breathing. °This information is not intended to replace advice given to you by your health care provider. Make sure you discuss any questions you have with your health care provider. °Document Released: 09/21/2015 Document Revised: 01/21/2016 Document Reviewed: 09/21/2015 °Elsevier Interactive Patient Education © 2018 [employer] ° ° °

## 2017-04-06 NOTE — Anesthesia Preprocedure Evaluation (Signed)
Anesthesia Evaluation  Patient identified by MRN, date of birth, ID band Patient awake    Reviewed: Allergy & Precautions, NPO status , Patient's Chart, lab work & pertinent test results  Airway Mallampati: II  TM Distance: >3 FB Neck ROM: Full    Dental no notable dental hx.    Pulmonary neg pulmonary ROS, former smoker,    Pulmonary exam normal breath sounds clear to auscultation       Cardiovascular hypertension, Pt. on medications Normal cardiovascular exam Rhythm:Regular Rate:Normal     Neuro/Psych negative neurological ROS  negative psych ROS   GI/Hepatic GERD  Medicated and Controlled,(+) Cirrhosis     substance abuse  alcohol use,   Endo/Other  Morbid obesity  Renal/GU negative Renal ROS  negative genitourinary   Musculoskeletal negative musculoskeletal ROS (+)   Abdominal   Peds negative pediatric ROS (+)  Hematology negative hematology ROS (+) HIV,   Anesthesia Other Findings   Reproductive/Obstetrics negative OB ROS                             Anesthesia Physical Anesthesia Plan  ASA: III  Anesthesia Plan: MAC   Post-op Pain Management:    Induction: Intravenous  PONV Risk Score and Plan: 2 and Ondansetron, Dexamethasone and Treatment may vary due to age or medical condition  Airway Management Planned: Nasal Cannula  Additional Equipment:   Intra-op Plan:   Post-operative Plan:   Informed Consent: I have reviewed the patients History and Physical, chart, labs and discussed the procedure including the risks, benefits and alternatives for the proposed anesthesia with the patient or authorized representative who has indicated his/her understanding and acceptance.   Dental advisory given  Plan Discussed with: CRNA  Anesthesia Plan Comments:         Anesthesia Quick Evaluation

## 2017-04-07 ENCOUNTER — Encounter (HOSPITAL_COMMUNITY): Payer: Self-pay | Admitting: Internal Medicine

## 2017-04-08 ENCOUNTER — Telehealth: Payer: Self-pay | Admitting: Pharmacist

## 2017-04-08 ENCOUNTER — Telehealth: Payer: Self-pay | Admitting: Internal Medicine

## 2017-04-08 MED ORDER — AMITRIPTYLINE HCL 100 MG PO TABS: ORAL_TABLET | ORAL | 5 refills | Status: DC

## 2017-04-08 NOTE — Telephone Encounter (Signed)
OK refill it x 6

## 2017-04-08 NOTE — Telephone Encounter (Signed)
Called and informed Mom that the elavil has been refilled.

## 2017-04-08 NOTE — Telephone Encounter (Signed)
Called patient to see how he is doing on [place]. Doing well - no missed doses. He was unaware he missed an appt with Dr. Tommy Medal on Monday. Rescheduled him for next week.

## 2017-04-08 NOTE — Telephone Encounter (Signed)
Please advise [name], thank you. 

## 2017-04-11 NOTE — Telephone Encounter (Signed)
Very good 

## 2017-04-12 ENCOUNTER — Ambulatory Visit (INDEPENDENT_AMBULATORY_CARE_PROVIDER_SITE_OTHER): Payer: Medicaid Other | Admitting: Infectious Disease

## 2017-04-12 VITALS — BP 141/86 | HR 80 | Temp 98.3°F | Wt 319.0 lb

## 2017-04-12 DIAGNOSIS — Z23 Encounter for immunization: Secondary | ICD-10-CM

## 2017-04-12 DIAGNOSIS — Z79899 Other long term (current) drug therapy: Secondary | ICD-10-CM

## 2017-04-12 DIAGNOSIS — K652 Spontaneous bacterial peritonitis: Secondary | ICD-10-CM

## 2017-04-12 DIAGNOSIS — K7030 Alcoholic cirrhosis of liver without ascites: Secondary | ICD-10-CM

## 2017-04-12 DIAGNOSIS — K7290 Hepatic failure, unspecified without coma: Secondary | ICD-10-CM | POA: Diagnosis not present

## 2017-04-12 DIAGNOSIS — B20 Human immunodeficiency virus [HIV] disease: Secondary | ICD-10-CM | POA: Diagnosis present

## 2017-04-12 DIAGNOSIS — K7682 Hepatic encephalopathy: Secondary | ICD-10-CM

## 2017-04-12 DIAGNOSIS — Z113 Encounter for screening for infections with a predominantly sexual mode of transmission: Secondary | ICD-10-CM

## 2017-04-12 NOTE — Progress Notes (Signed)
Chief complaint: [name] is still very unhappy with gynecomastia and abdominal girth  Subjective:    Patient ID: [name], male    DOB: 1966/03/29, 51 y.o.   MRN: [id number]    HPI  [name] is a 51 y.o. male with HIV infection, alcoholic cirrhosis with esophageal varices. He underwent EGD with Dr. Carlean Purl last week where varices were found.  His HIV remains nicely controlled now on BIKTARVY  Lab Results  Component Value Date   HIV1RNAQUANT 73 (H) 01/12/2017   HIV1RNAQUANT 28 (H) 08/10/2016   HIV1RNAQUANT <20 03/02/2016     Lab Results  Component Value Date   CD4TABS 220 (L) 01/12/2017   CD4TABS 150 (L) 08/10/2016   CD4TABS 250 (L) 03/02/2016    [name] today continues to complain about his gynecomastia and abdominal fat. I told him I do not have a specific intervention to offer him from an HIV standpoint re these problems and cautioned him that any weight loss program he would consider would need to be formulated with his unique dietary requirements as a patient with cirrhosis (in particular no high protein diets).     Past Medical History:  Diagnosis Date  . Alcoholic cirrhosis of liver ([place])   . Alcoholism, chronic ([place])   . ANXIETY 06/23/2006  . Ascites 11/13/2009  . Ascites   . Avulsion fracture of middle phalanges of  3rd/4th fingers left hand 01/07/2014  . Blood dyscrasia    HIV  . Cellulitis of right lower extremity 03/02/2016  . DEPRESSION 06/23/2006  . 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 Erectile dysfunction 07/14/2015  . 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 Gastric ulcer 06/2010  . GERD (gastroesophageal reflux disease)   . HIV DISEASE 06/23/2006  . HYPERLIPIDEMIA 06/23/2006  . HYPERTENSION 06/23/2006   denies  . 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 Iron deficiency anemia   . Left foot infection   . Obesity (BMI 30-39.9)    BMI 34 kg/m^2  . Portal hypertensive gastropathy ([place]) 01/02/2013  . Recent shoulder injury     left shoulder/fell in parking lot/ no surgery/December 26, 2016  . SBP (spontaneous bacterial peritonitis) ([place]) 05/03/2011   Suspected by high leukocytes on paracentesis. Clinical scenario also compatible. November 2012 responded to Levaquin. Started on trimethoprim-sulfamethoxazole double strength daily for prophylaxis.   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 Skin cancer    left calf  . STRAIN, CHEST WALL 03/15/2007  . Varices, esophageal ([place]) 06/2010  . Venous stasis 03/02/2016    Past Surgical History:  Procedure Laterality Date  . CARPAL TUNNEL RELEASE     left  . COLONOSCOPY  march 2013  . ESOPHAGEAL BANDING  05/01/2013   Procedure: ESOPHAGEAL BANDING;  Surgeon: Gatha Mayer, MD;  Location: [facility];  Service: Endoscopy;;  . ESOPHAGOGASTRODUODENOSCOPY  07/01/2010;  08/12/10   small varices, gastric ulcer  . ESOPHAGOGASTRODUODENOSCOPY  10/26/2011   Procedure: ESOPHAGOGASTRODUODENOSCOPY (EGD);  Surgeon: Gatha Mayer, MD;  Location: [facility];  Service: Endoscopy;  Laterality: N/A;  . ESOPHAGOGASTRODUODENOSCOPY N/A 01/02/2013   Procedure: ESOPHAGOGASTRODUODENOSCOPY (EGD);  Surgeon: Gatha Mayer, MD;  Location: [facility];  Service: Endoscopy;  Laterality: N/A;  . ESOPHAGOGASTRODUODENOSCOPY N/A 04/23/2013   Procedure: ESOPHAGOGASTRODUODENOSCOPY (EGD);  Surgeon: Gatha Mayer, MD;  Location: [facility];  Service: Endoscopy;  Laterality: N/A;  . ESOPHAGOGASTRODUODENOSCOPY N/A 05/01/2013   Procedure: ESOPHAGOGASTRODUODENOSCOPY (EGD);  Surgeon: Gatha Mayer, MD;  Location: [facility];  Service: Endoscopy;  Laterality: N/A;  follow-up varices  and possibly band them  . ESOPHAGOGASTRODUODENOSCOPY N/A 09/04/2013   Procedure: ESOPHAGOGASTRODUODENOSCOPY (EGD);  Surgeon: Gatha Mayer, MD;  Location: [facility];  Service: Endoscopy;  Laterality: N/A;  . ESOPHAGOGASTRODUODENOSCOPY N/A 09/10/2014   Procedure: ESOPHAGOGASTRODUODENOSCOPY (EGD);  Surgeon: Gatha Mayer, MD;   Location: [facility];  Service: Endoscopy;  Laterality: N/A;  . ESOPHAGOGASTRODUODENOSCOPY (EGD) WITH PROPOFOL N/A 08/21/2015   Procedure: ESOPHAGOGASTRODUODENOSCOPY (EGD) WITH PROPOFOL;  Surgeon: Gatha Mayer, MD;  Location: [facility];  Service: Endoscopy;  Laterality: N/A;  . ESOPHAGOGASTRODUODENOSCOPY (EGD) WITH PROPOFOL N/A 04/06/2017   Procedure: ESOPHAGOGASTRODUODENOSCOPY (EGD) WITH PROPOFOL;  Surgeon: Gatha Mayer, MD;  Location: [facility];  Service: Endoscopy;  Laterality: N/A;  . ESOPHAGOGASTRODUODENOSCOPY W/ BANDING  06/26/2010   variceal ligation  . GASTRIC VARICES BANDING N/A 01/02/2013   Procedure: GASTRIC VARICES BANDING;  Surgeon: Gatha Mayer, MD;  Location: [facility];  Service: Endoscopy;  Laterality: N/A;  possible banding  . UPPER GASTROINTESTINAL ENDOSCOPY      Family History  Problem Relation Age of Onset  . Hyperlipidemia Mother   . Hypertension Mother   . Breast cancer Mother        questionable  . Heart disease Mother   . Hypertension Father   . Irritable bowel syndrome Father   . Drug abuse Brother   . Heart disease Brother   . Breast cancer Maternal Aunt        maternal great aunt  . Alcohol abuse Other   . Heart disease Maternal Uncle   . Irritable bowel syndrome Paternal Aunt   . Colon cancer Neg Hx       Social History   Social History  . Marital status: Single    Spouse name: N/A  . Number of children: N/A  . Years of education: N/A   Occupational History  . disability    Social History Main Topics  . Smoking status: Former Smoker    Packs/day: 0.10    Years: 10.00    Types: Cigarettes    Start date: 03/03/2014    Quit date: 04/03/2014  . Smokeless tobacco: Never Used     Comment: Chews nicorette gum.  Marland Kitchen Alcohol use No     Comment: pt is an alcoholic currently in [employer]  . Drug use: No  . Sexual activity: Yes    Partners: Male    Birth control/ protection: Condom     Comment: pt. declined condoms    Other Topics Concern  . Not on file   Social History Narrative   Single, disabled hair stylist   Lives with parents in a home with a basement.  Parents do not like for him to go down stairs because they are steep.               Allergies  Allergen Reactions  . Penicillins     REACTION: hives Has patient had a PCN reaction causing immediate rash, facial/tongue/throat swelling, SOB or lightheadedness with hypotension: Yes Has patient had a PCN reaction causing severe rash involving mucus membranes or skin necrosis: Yes Has patient had a PCN reaction that required hospitalization No Has patient had a PCN reaction occurring within the last 10 years: Yes If all of the above answers are "NO", then may proceed with Cephalosporin use./Per pt makes him feel weird!   . Sulfa Antibiotics     Pt states he did not have a reaction last time to Sulfa!     Current Outpatient Prescriptions:  .  ALPRAZolam (XANAX) 1 MG tablet, TAKE 1 TABLET BY MOUTH TWICE DAILY (Patient taking differently: TAKE 0.5 TABLET (0.5 MG) BY MOUTH TWICE DAILY), Disp: 60 tablet, Rfl: 2 .  AMBULATORY NON FORMULARY MEDICATION, Lift Chair- Use as needed Dx: generalized weakness, hepatic encephalopathy, Disp: 1 each, Rfl: 0 .  aMILoride (MIDAMOR) 5 MG tablet, TAKE 3 TABLETS(15 MG) BY MOUTH DAILY, Disp: 90 tablet, Rfl: 11 .  amitriptyline (ELAVIL) 100 MG tablet, TAKE 1 TABLET(100 MG) BY MOUTH AT BEDTIME, Disp: 30 tablet, Rfl: 5 .  bictegravir-emtricitabine-tenofovir AF (BIKTARVY) 50-200-25 MG TABS tablet, Take 1 tablet by mouth daily., Disp: 30 tablet, Rfl: 11 .  CONSTULOSE 10 GM/15ML solution, TAKE 60 ML BY MOUTH 4 TIMES DAILY, Disp: 3792 mL, Rfl: 5 .  Docusate Sodium (GNP STOOL SOFTENER EX ST PO), Take 1 tablet by mouth at bedtime., Disp: , Rfl:  .  esomeprazole (NEXIUM) 40 MG capsule, Take 1 capsule (40 mg total) by mouth daily before breakfast., Disp: 30 capsule, Rfl: 0 .  furosemide (LASIX) 40 MG tablet, TAKE 2  TABLETS(80 MG) BY MOUTH TWICE DAILY, Disp: 120 tablet, Rfl: 11 .  guaiFENesin (MUCINEX) 600 MG 12 hr tablet, Take 600 mg by mouth 2 (two) times daily as needed (for congestion or cough). , Disp: , Rfl:  .  NEXIUM 40 MG capsule, TAKE 1 CAPSULE BY MOUTH EVERY DAY BEFORE BREAKFAST, Disp: 30 capsule, Rfl: 11 .  Polyethyl Glycol-Propyl Glycol (SYSTANE OP), Place 1 drop into both eyes 3 (three) times daily as needed (3). , Disp: , Rfl:  .  potassium chloride SA (K-DUR,KLOR-CON) 20 MEQ tablet, TAKE 2 TABLETS BY MOUTH EVERY DAY (Patient taking differently: TAKE 1 TABLETS BY MOUTH TWICE DAILY), Disp: 60 tablet, Rfl: 11 .  senna (SENOKOT) 8.6 MG TABS tablet, TAKE 2 TABLETS(17.2 MG) BY MOUTH DAILY. START WITH 1 TABLET (Patient taking differently: TAKE 1 TABLETS(17.2 MG) BY MOUTH TWICE DAILY. START WITH 1 TABLET), Disp: 60 tablet, Rfl: 0 .  sildenafil (VIAGRA) 25 MG tablet, Take 1 tablet (25 mg total) by mouth daily as needed for erectile dysfunction., Disp: 10 tablet, Rfl: 4 .  Simethicone (GAS-X PO), Take 1-2 tablets by mouth 3 (three) times daily as needed (for flatulence). , Disp: , Rfl:  .  sodium phosphate (FLEET) enema, Place 1 enema rectally at bedtime as needed (for constipation.). , Disp: , Rfl:  .  SSD 1 % cream, Apply 1 application topically daily as needed (Fever blisters.). , Disp: , Rfl: 0 .  traMADol (ULTRAM) 50 MG tablet, Take 50 mg by mouth every 4 (four) hours as needed. For pain., Disp: , Rfl: 0 .  triamcinolone cream (KENALOG) 0.1 %, APPLY TO AFFECTED AREA DAILY AS NEEDED FOR ITCHING OR RASH, Disp: , Rfl: 1 .  Wheat Dextrin (BENEFIBER PO), Take 1 Dose by mouth daily as needed (for constipation.)., Disp: , Rfl:  .  XIFAXAN 550 MG TABS tablet, TAKE 1 TABLET BY MOUTH TWICE DAILY, Disp: 60 tablet, Rfl: 11   Review of Systems  Constitutional: Negative for chills, diaphoresis, fever and unexpected weight change.  HENT: Negative for congestion, rhinorrhea, sinus pressure, sneezing, sore throat  and trouble swallowing.   Eyes: Negative for photophobia and visual disturbance.  Respiratory: Negative for cough, chest tightness, shortness of breath, wheezing and stridor.   Cardiovascular: Negative for chest pain, palpitations and leg swelling.  Gastrointestinal: Negative for abdominal distention, abdominal pain, anal bleeding, blood in stool, constipation, diarrhea, nausea and vomiting.  Genitourinary: Negative for difficulty urinating,  dysuria, flank pain and hematuria.  Musculoskeletal: Negative for arthralgias, back pain, joint swelling and myalgias.  Skin: Negative for pallor, rash and wound.  Neurological: Negative for dizziness, tremors, seizures and light-headedness.  Hematological: Negative for adenopathy. Does not bruise/bleed easily.  Psychiatric/Behavioral: Positive for dysphoric mood. Negative for agitation, behavioral problems and decreased concentration. The patient is not nervous/anxious.        Objective:   Physical Exam  Constitutional: He is oriented to person, place, and time. He appears well-developed and well-nourished. No distress.  HENT:  Head: Normocephalic and atraumatic.  Mouth/Throat: Oropharynx is clear and moist. No oropharyngeal exudate.  Eyes: Pupils are equal, round, and reactive to light. Conjunctivae and EOM are normal. No scleral icterus.  Neck: Normal range of motion. Neck supple. No JVD present.  Cardiovascular: Normal rate and regular rhythm.   Pulmonary/Chest: Effort normal. No respiratory distress. He has no wheezes.  Abdominal: Soft. There is no tenderness.  Musculoskeletal: He exhibits no edema or tenderness.  Lymphadenopathy:    He has no cervical adenopathy.  Neurological: He is alert and oriented to person, place, and time. No sensory deficit. He exhibits normal muscle tone. Gait normal.  Skin: Skin is warm and dry. He is not diaphoretic. No pallor.  Psychiatric: Thought content normal. His speech is delayed. He is slowed. He exhibits a  depressed mood.        Assessment & Plan:   HIV: continue BIKTARVY, recheck labs in a few months. Vaccinate for influenza and ensure meningococcal vaccination.   Obesity: Difficult situation with his cirrhosis making it difficult for him to jump at conventional diets. He seems to want surgical solution but these are not trivial either in light of risks of anesthesia  Depression and problems with body image and anxiety: he needs to continue meeting counselors    Etoh induced cirrhosis with varices: followed by [facility], Dr. Carlean Purl s EGD last week.  Encephalopathy: taking lactulose he claims.

## 2017-04-29 MED FILL — BIKTARVY 50-200-25 MG TABS: 50-200-25 | 30 days supply | Qty: 30 | Fill #7

## 2017-05-30 MED FILL — BIKTARVY 50-200-25 MG TABS: 50-200-25 | 30 days supply | Qty: 30 | Fill #8

## 2017-05-31 ENCOUNTER — Telehealth: Payer: Self-pay | Admitting: Internal Medicine

## 2017-05-31 MED ORDER — ALPRAZOLAM 1 MG PO TABS: 1.0000 mg | ORAL_TABLET | Freq: Two times a day (BID) | ORAL | 2 refills | Status: DC

## 2017-05-31 NOTE — Telephone Encounter (Signed)
Spoke with pharmacy and the earliest they can fill the xanax is 06/06/17. Perhaps he is asking early as he doesn't know our holiday hours. Please advise [name], thanks.

## 2017-05-31 NOTE — Telephone Encounter (Signed)
OK to refill x 3 starting 12/24

## 2017-05-31 NOTE — Telephone Encounter (Signed)
Left message that xanax sent in and he can pick it up 06/06/17.

## 2017-05-31 NOTE — Telephone Encounter (Signed)
Patient states he needs medication xanax refilled at [facility].

## 2017-05-31 NOTE — Telephone Encounter (Signed)
Please advise [name], thank you. 

## 2017-05-31 NOTE — Telephone Encounter (Signed)
That seems early  Can you clarify dates?

## 2017-05-31 NOTE — Telephone Encounter (Signed)
I called the pharmacy and verbally approved the xanax and he requested I fax the refill over which I did.  I told the pharmacy they may fill it 06/06/17 per Dr Carlean Purl.

## 2017-07-05 MED FILL — BIKTARVY 50-200-25 MG TABS: 50-200-25 | 30 days supply | Qty: 30 | Fill #9

## 2017-07-06 ENCOUNTER — Telehealth: Payer: Self-pay | Admitting: *Deleted

## 2017-07-06 NOTE — Telephone Encounter (Signed)
I dont know about that program but Dr. Carlean Purl might

## 2017-07-06 NOTE — Telephone Encounter (Signed)
Patient called, asking for follow up about the [place] [facility] program, specifically wanting the phone number. RN Unable to find any information in Dr Derek Mound notes about this. Patient thought he might call Dr Celesta Aver office. Landis Gandy, RN

## 2017-07-08 ENCOUNTER — Other Ambulatory Visit: Payer: Self-pay | Admitting: Internal Medicine

## 2017-07-08 ENCOUNTER — Ambulatory Visit: Payer: Medicaid Other | Admitting: Internal Medicine

## 2017-07-08 NOTE — Telephone Encounter (Signed)
OK to refill x 1 year 

## 2017-07-08 NOTE — Telephone Encounter (Signed)
How many refills [name]? 

## 2017-07-11 ENCOUNTER — Telehealth: Payer: Self-pay

## 2017-07-11 NOTE — Telephone Encounter (Signed)
I did a prior authorization thru [place] for patients xifaxan. Dx Hepatic Encephalopathy.  Approved for a year.  [facility] informed and they mail the patient the meds. Ref # [id number].

## 2017-07-13 ENCOUNTER — Telehealth: Payer: Self-pay | Admitting: *Deleted

## 2017-07-13 NOTE — Telephone Encounter (Signed)
Patient states he is having side effects from [place], primarily sweating profusely from underarms and inguinal areas. He wants to know if this is from his [place]. He is not sure how long he has been sweating.  He will be discussing this with Dr Carlean Purl tomorrow as well. Landis Gandy, RN

## 2017-07-14 ENCOUNTER — Ambulatory Visit: Payer: Medicaid Other | Admitting: Internal Medicine

## 2017-07-14 ENCOUNTER — Encounter: Payer: Self-pay | Admitting: Internal Medicine

## 2017-07-14 VITALS — BP 108/68 | HR 72 | Ht 72.0 in | Wt 324.4 lb

## 2017-07-14 DIAGNOSIS — K7290 Hepatic failure, unspecified without coma: Secondary | ICD-10-CM

## 2017-07-14 DIAGNOSIS — M25512 Pain in left shoulder: Secondary | ICD-10-CM | POA: Diagnosis not present

## 2017-07-14 DIAGNOSIS — G8929 Other chronic pain: Secondary | ICD-10-CM

## 2017-07-14 DIAGNOSIS — F411 Generalized anxiety disorder: Secondary | ICD-10-CM

## 2017-07-14 DIAGNOSIS — K7031 Alcoholic cirrhosis of liver with ascites: Secondary | ICD-10-CM | POA: Diagnosis not present

## 2017-07-14 DIAGNOSIS — I8511 Secondary esophageal varices with bleeding: Secondary | ICD-10-CM | POA: Diagnosis not present

## 2017-07-14 DIAGNOSIS — K7682 Hepatic encephalopathy: Secondary | ICD-10-CM

## 2017-07-14 NOTE — Telephone Encounter (Signed)
Spoke with patient, relayed information.

## 2017-07-14 NOTE — Patient Instructions (Signed)
  You have been scheduled for an abdominal ultrasound at [facility] on 07/22/17 at 11:00AM. Please arrive 20 minutes prior to your appointment for registration. Make certain not to have anything to eat or drink 6 hours prior to your appointment. Should you need to reschedule your appointment, please contact radiology at [phone] This test typically takes about 30 minutes to perform.   I appreciate the opportunity to care for you. Silvano Rusk, MD, [facility]

## 2017-07-14 NOTE — Telephone Encounter (Signed)
Biktarvy does not cause sweating

## 2017-07-14 NOTE — Telephone Encounter (Signed)
Perfect

## 2017-07-14 NOTE — Assessment & Plan Note (Addendum)
Doing well we will continue current therapy and do a screening abdominal ultrasound

## 2017-07-14 NOTE — Assessment & Plan Note (Signed)
No bleeding in years - Gr 1 03/2017

## 2017-07-14 NOTE — Assessment & Plan Note (Signed)
Clinically well

## 2017-07-14 NOTE — Progress Notes (Signed)
[name] 52 y.o. [birth year] [id number]  Assessment & Plan:   Encounter Diagnoses  Name Primary?  . Alcoholic cirrhosis of liver with ascites ([place]) Yes  . Encephalopathy, hepatic ([place])   . Secondary esophageal varices with bleeding ([place])   . Chronic left shoulder pain   . Anxiety state     Alcoholic cirrhosis of liver Doing well we will continue current therapy and do a screening abdominal ultrasound  Encephalopathy, hepatic Clinically well  Secondary esophageal varices with bleeding ([place]) No bleeding in years - Gr 1 03/2017  Anxiety state He seems to be doing well, he has taken a small dose of alprazolam for years and he is tolerating that as well as he ever has.  We will continue as I do not think he can come off it, when we have tried in the past it did not go well.    I will ask an orthopedic colleague if they could see [name] in follow-up.  He may need a referral from his primary care given his Medicaid insurance coverage.  I appreciate the opportunity to care for this patient. CC: Greig Right, MD Dr. Dietrich [place] Subjective:   Chief Complaint: Follow-up of cirrhosis  HPI [name] is here with his mom as usual.  He feels well overall, he is continuing to recover from an injury to his left shoulder where he had a fracture, and is doing home exercises prescribed by physical therapy and is improving in the range of motion but is not back to full function.  We reviewed the records and the [facility] orthopedist he saw suggested that he be seen sometime about now or so to see if there was any indication for surgery.  [name] and his mother would prefer to have orthopedic follow-up in [place] if they could given the distance to [facility] and the problems with transportation that they do have.  He has been thinking clearly.  No confusion.  No new swelling or problems with his liver disease that he can tell.  Mom agrees.  He had an EGD in the fall and his varices were  stable and small.  Is due to see ID in March, he has been having increased sweating with a different HIV treatment, before that he was not perspiring much at all.  He will follow-up with Dr. Drucilla Schmidt about these issues since they seem to be related to his HIV medication.  He has not had any alcohol since I last saw him he tells me. Allergies  Allergen Reactions  . Penicillins     REACTION: hives Has patient had a PCN reaction causing immediate rash, facial/tongue/throat swelling, SOB or lightheadedness with hypotension: Yes Has patient had a PCN reaction causing severe rash involving mucus membranes or skin necrosis: Yes Has patient had a PCN reaction that required hospitalization No Has patient had a PCN reaction occurring within the last 10 years: Yes If all of the above answers are "NO", then may proceed with Cephalosporin use./Per pt makes him feel weird!   . Sulfa Antibiotics     Pt states he did not have a reaction last time to Sulfa!   Current Meds  Medication Sig  . ALPRAZolam (XANAX) 1 MG tablet Take 1 tablet (1 mg total) by mouth 2 (two) times daily.  . AMBULATORY NON FORMULARY MEDICATION Lift Chair- Use as needed Dx: generalized weakness, hepatic encephalopathy  . aMILoride (MIDAMOR) 5 MG tablet TAKE 3 TABLETS(15 MG) BY MOUTH DAILY  . amitriptyline (ELAVIL)  100 MG tablet TAKE 1 TABLET(100 MG) BY MOUTH AT BEDTIME  . bictegravir-emtricitabine-tenofovir AF (BIKTARVY) 50-200-25 MG TABS tablet Take 1 tablet by mouth daily.  . CONSTULOSE 10 GM/15ML solution TAKE 60 ML BY MOUTH 4 TIMES DAILY  . Docusate Sodium (GNP STOOL SOFTENER EX ST PO) Take 1 tablet by mouth at bedtime.  Marland Kitchen esomeprazole (NEXIUM) 40 MG capsule Take 1 capsule (40 mg total) by mouth daily before breakfast.  . furosemide (LASIX) 40 MG tablet TAKE 2 TABLETS(80 MG) BY MOUTH TWICE DAILY  . guaiFENesin (MUCINEX) 600 MG 12 hr tablet Take 600 mg by mouth 2 (two) times daily as needed (for congestion or cough).   . NEXIUM 40 MG  capsule TAKE 1 CAPSULE BY MOUTH EVERY DAY BEFORE BREAKFAST  . Polyethyl Glycol-Propyl Glycol (SYSTANE OP) Place 1 drop into both eyes 3 (three) times daily as needed (3).   . potassium chloride SA (K-DUR,KLOR-CON) 20 MEQ tablet TAKE 2 TABLETS BY MOUTH EVERY DAY (Patient taking differently: TAKE 1 TABLETS BY MOUTH TWICE DAILY)  . senna (SENOKOT) 8.6 MG TABS tablet TAKE 2 TABLETS(17.2 MG) BY MOUTH DAILY. START WITH 1 TABLET (Patient taking differently: TAKE 1 TABLETS(17.2 MG) BY MOUTH TWICE DAILY. START WITH 1 TABLET)  . sildenafil (VIAGRA) 25 MG tablet Take 1 tablet (25 mg total) by mouth daily as needed for erectile dysfunction.  . Simethicone (GAS-X PO) Take 1-2 tablets by mouth 3 (three) times daily as needed (for flatulence).   . sodium phosphate (FLEET) enema Place 1 enema rectally at bedtime as needed (for constipation.).   Marland Kitchen SSD 1 % cream Apply 1 application topically daily as needed (Fever blisters.).   Marland Kitchen traMADol (ULTRAM) 50 MG tablet Take 50 mg by mouth every 4 (four) hours as needed. For pain.  Marland Kitchen triamcinolone cream (KENALOG) 0.1 % APPLY TO AFFECTED AREA DAILY AS NEEDED FOR ITCHING OR RASH  . Wheat Dextrin (BENEFIBER PO) Take 1 Dose by mouth daily as needed (for constipation.).  Marland Kitchen XIFAXAN 550 MG TABS tablet TAKE ONE TABLET BY MOUTH TWICE DAILY   Past Medical History:  Diagnosis Date  . Alcoholic cirrhosis of liver ([place])   . Alcoholism, chronic ([place])   . ANXIETY 06/23/2006  . Ascites 11/13/2009  . Ascites   . Avulsion fracture of middle phalanges of  3rd/4th fingers left hand 01/07/2014  . Blood dyscrasia    HIV  . Cellulitis of right lower extremity 03/02/2016  . DEPRESSION 06/23/2006  . 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 Erectile dysfunction 07/14/2015  . 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 Gastric ulcer 06/2010  . GERD (gastroesophageal reflux disease)   . HIV DISEASE 06/23/2006  . HYPERLIPIDEMIA 06/23/2006  . HYPERTENSION 06/23/2006   denies  . 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 Iron deficiency anemia   . Left foot infection   . Obesity (BMI 30-39.9)    BMI 34 kg/m^2  . Portal hypertensive gastropathy ([place]) 01/02/2013  . Recent shoulder injury    left shoulder/fell in parking lot/ no surgery/December 26, 2016  . SBP (spontaneous bacterial peritonitis) ([place]) 05/03/2011   Suspected by high leukocytes on paracentesis. Clinical scenario also compatible. November 2012 responded to Levaquin. Started on trimethoprim-sulfamethoxazole double strength daily for prophylaxis.   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 Skin cancer    left calf  . STRAIN, CHEST WALL 03/15/2007  . Varices, esophageal ([place]) 06/2010  . Venous stasis 03/02/2016   Past Surgical History:  Procedure Laterality Date  . CARPAL TUNNEL  RELEASE     left  . COLONOSCOPY  march 2013  . ESOPHAGEAL BANDING  05/01/2013   Procedure: ESOPHAGEAL BANDING;  Surgeon: Gatha Mayer, MD;  Location: [facility] END[facility];  Service: Endoscopy;;  . ESOPHAGOGASTRODUODENOSCOPY  07/01/2010;  08/12/10   small varices, gastric ulcer  . ESOPHAGOGASTRODUODENOSCOPY  10/26/2011   Procedure: ESOPHAGOGASTRODUODENOSCOPY (EGD);  Surgeon: Gatha Mayer, MD;  Location: [facility];  Service: Endoscopy;  Laterality: N/A;  . ESOPHAGOGASTRODUODENOSCOPY N/A 01/02/2013   Procedure: ESOPHAGOGASTRODUODENOSCOPY (EGD);  Surgeon: Gatha Mayer, MD;  Location: [facility];  Service: Endoscopy;  Laterality: N/A;  . ESOPHAGOGASTRODUODENOSCOPY N/A 04/23/2013   Procedure: ESOPHAGOGASTRODUODENOSCOPY (EGD);  Surgeon: Gatha Mayer, MD;  Location: [facility];  Service: Endoscopy;  Laterality: N/A;  . ESOPHAGOGASTRODUODENOSCOPY N/A 05/01/2013   Procedure: ESOPHAGOGASTRODUODENOSCOPY (EGD);  Surgeon: Gatha Mayer, MD;  Location: [facility];  Service: Endoscopy;  Laterality: N/A;  follow-up varices and possibly band them  . ESOPHAGOGASTRODUODENOSCOPY N/A 09/04/2013   Procedure: ESOPHAGOGASTRODUODENOSCOPY (EGD);   Surgeon: Gatha Mayer, MD;  Location: [facility];  Service: Endoscopy;  Laterality: N/A;  . ESOPHAGOGASTRODUODENOSCOPY N/A 09/10/2014   Procedure: ESOPHAGOGASTRODUODENOSCOPY (EGD);  Surgeon: Gatha Mayer, MD;  Location: [facility];  Service: Endoscopy;  Laterality: N/A;  . ESOPHAGOGASTRODUODENOSCOPY (EGD) WITH PROPOFOL N/A 08/21/2015   Procedure: ESOPHAGOGASTRODUODENOSCOPY (EGD) WITH PROPOFOL;  Surgeon: Gatha Mayer, MD;  Location: [facility];  Service: Endoscopy;  Laterality: N/A;  . ESOPHAGOGASTRODUODENOSCOPY (EGD) WITH PROPOFOL N/A 04/06/2017   Procedure: ESOPHAGOGASTRODUODENOSCOPY (EGD) WITH PROPOFOL;  Surgeon: Gatha Mayer, MD;  Location: [facility];  Service: Endoscopy;  Laterality: N/A;  . ESOPHAGOGASTRODUODENOSCOPY W/ BANDING  06/26/2010   variceal ligation  . GASTRIC VARICES BANDING N/A 01/02/2013   Procedure: GASTRIC VARICES BANDING;  Surgeon: Gatha Mayer, MD;  Location: [facility];  Service: Endoscopy;  Laterality: N/A;  possible banding  . UPPER GASTROINTESTINAL ENDOSCOPY     Social History   Social History Narrative   Single, disabled hair stylist   Lives with parents in a home with a basement.  Parents do not like for him to go down stairs because they are steep.           family history includes Alcohol abuse in his other; Breast cancer in his maternal aunt and mother; Drug abuse in his brother; Heart disease in his brother, maternal uncle, and mother; Hyperlipidemia in his mother; Hypertension in his father and mother; Irritable bowel syndrome in his father and paternal aunt.   Review of Systems   Objective:   Physical Exam @BP  108/68   Pulse 72   Ht 6' (1.829 m)   Wt (!) 324 lb 6 oz (147.1 kg)   BMI 43.99 kg/m @  General:  NAD, obese Eyes:   anicteric Lungs:  clear Heart::  S1S2 no rubs, murmurs or gallops Abdomen:  obesesoft and nontender, BS+,  Ext:   no edema, cyanosis or clubbing - decreased ROM L shldr cannot lift above  head Neuro: Alert and oriented x 3 no asterixis    Data Reviewed:   Recent labs, other notes in the computer as above.

## 2017-07-14 NOTE — Assessment & Plan Note (Signed)
He seems to be doing well, he has taken a small dose of alprazolam for years and he is tolerating that as well as he ever has.  We will continue as I do not think he can come off it, when we have tried in the past it did not go well.

## 2017-07-21 ENCOUNTER — Other Ambulatory Visit: Payer: Self-pay | Admitting: Internal Medicine

## 2017-07-22 ENCOUNTER — Other Ambulatory Visit: Payer: Medicaid Other

## 2017-08-08 MED FILL — BIKTARVY 50-200-25 MG TABS: 50-200-25 | 30 days supply | Qty: 30 | Fill #10

## 2017-08-15 ENCOUNTER — Telehealth: Payer: Self-pay

## 2017-08-15 ENCOUNTER — Telehealth: Payer: Self-pay | Admitting: Internal Medicine

## 2017-08-15 DIAGNOSIS — S42402S Unspecified fracture of lower end of left humerus, sequela: Secondary | ICD-10-CM

## 2017-08-15 NOTE — Telephone Encounter (Signed)
-----   Message from Gatha Mayer, MD sent at 08/14/2017 10:41 AM EST ----- Regarding: RE: ? for you Thanks  We will arrange a referral   ----- Message ----- From: Meredith Pel, MD Sent: 08/13/2017   9:43 AM To: Gatha Mayer, MD Subject: RE: ? for you                                  sure ----- Message ----- From: Gatha Mayer, MD Sent: 07/14/2017   5:52 PM To: Meredith Pel, MD Subject: ? for you                                      This is my patient with cirrhosis and HIV - both stable  Was seen at [facility] after a fall and humerus fx  Wanting to get f/u locally  This is Ass/Plan from last [facility] visit 02/2017  1. Healed 4 part valgus producing proximal humerus fracture.  At this time Mr. Hughe's fracture is healed and early shoulder revision is not recommended at this point, as we would likely convert him to reverse TSA. We will wait 5 months to see how much shoulder function he regains before considering revision  PLAN:  - Follow-up plan: f/u 5 months - X-rays to be ordered next visit: 3 views L shoulder  Not th best surgical candidate and is getting better w/ PT and home exercises  Would you be able to see him in f/u  He has [employer] and I think his PCP will need to refer if so  Thanks  Glendell Docker

## 2017-08-16 NOTE — Telephone Encounter (Signed)
Patient's mother notified that she will need to sign a release if she needs records sent and that they will need to go by HIM at [address]. She is asking about mileage form.  She is notified that I am unaware of any mileage forms and she should check with his primary care.

## 2017-08-16 NOTE — Addendum Note (Signed)
Addended by: Marlon Pel on: 08/16/2017 09:25 AM   Modules accepted: Orders

## 2017-08-16 NOTE — Telephone Encounter (Signed)
Patient's mother notified.

## 2017-08-16 NOTE — Telephone Encounter (Signed)
Patient mother states she needs a paper with all patients ov and procedure dates from the past year faxed to [phone] for medicaid purposes.

## 2017-08-16 NOTE — Telephone Encounter (Signed)
Referral placed. [facility] will contact the patient for an appt.

## 2017-08-22 ENCOUNTER — Ambulatory Visit (INDEPENDENT_AMBULATORY_CARE_PROVIDER_SITE_OTHER): Payer: Medicaid Other | Admitting: Infectious Disease

## 2017-08-22 ENCOUNTER — Encounter: Payer: Self-pay | Admitting: Infectious Disease

## 2017-08-22 VITALS — BP 132/84 | HR 76 | Temp 97.7°F | Ht 72.0 in | Wt 326.0 lb

## 2017-08-22 DIAGNOSIS — K7031 Alcoholic cirrhosis of liver with ascites: Secondary | ICD-10-CM

## 2017-08-22 DIAGNOSIS — W19XXXA Unspecified fall, initial encounter: Secondary | ICD-10-CM | POA: Diagnosis not present

## 2017-08-22 DIAGNOSIS — B20 Human immunodeficiency virus [HIV] disease: Secondary | ICD-10-CM | POA: Diagnosis present

## 2017-08-22 DIAGNOSIS — S60221A Contusion of right hand, initial encounter: Secondary | ICD-10-CM

## 2017-08-22 DIAGNOSIS — S61411A Laceration without foreign body of right hand, initial encounter: Secondary | ICD-10-CM | POA: Diagnosis not present

## 2017-08-22 DIAGNOSIS — S0081XA Abrasion of other part of head, initial encounter: Secondary | ICD-10-CM

## 2017-08-22 MED ORDER — EMTRICITABINE-TENOFOVIR AF 200-25 MG PO TABS: 1.0000 | ORAL_TABLET | Freq: Every day | ORAL | 11 refills | Status: DC

## 2017-08-22 MED ORDER — DOLUTEGRAVIR SODIUM 50 MG PO TABS
50.0000 mg | ORAL_TABLET | Freq: Every day | ORAL | 11 refills | Status: DC
Start: 2017-08-22 — End: 2017-10-21

## 2017-08-22 NOTE — Progress Notes (Signed)
Chief complaint: [name] is concerned that [name] is causing [name] to have diaphoresis he also suffered a mechanical fall upon coming into the clinic today.  Subjective:    Patient ID: [name], male    DOB: [birth year], 52 y.o.   MRN: [id number]    HPI  [name] is a 52 y.o. male with HIV infection, alcoholic cirrhosis with esophageal varices. He underwent EGD with Dr. Carlean Purl last week where varices were found.  His HIV remains nicely controlled now on [name]  Lab Results  Component Value Date   HIV1RNAQUANT 73 (H) 01/12/2017   HIV1RNAQUANT 28 (H) 08/10/2016   HIV1RNAQUANT <20 03/02/2016     Lab Results  Component Value Date   CD4TABS 220 (L) 01/12/2017   CD4TABS 150 (L) 08/10/2016   CD4TABS 250 (L) 03/02/2016    On trying to come to the clinic today he apparently slid and fell while getting out of his car.  He came to clinic with his mother.  In any case when [name] was attempting to get out of the car he put one foot on the shoulder of the side walk and one on the road when this happened unfortunately he came out of balance and slipped and fell striking his face as well as hitting his right hand where he has had bleeding and also an area of ecchymosis.  He did not lose consciousness prior to or after this event.  The clinic he had no dizziness or orthostatic symptoms.  I observed [name] walking around the clinic and he was comfortable and without evidence of unsteadiness.  Noted that he was wearing sandals which may not be the best to choice in someone who has difficulty sensing the environment.  He believes that he has been sweating more since having been on [name].  He says this is been going on since December.  In reality I changed [name] to [facility] last April and he was not having any such symptoms that he reported to me in October.  He nonetheless request to go back on his prior regimen which is actually pharmacologically quite similar to his current regimen.  I do  not think we will make any difference in his sweating but I will change [name] back to [facility] and DESCOVY and send these prescriptions to [facility] long outpatient pharmacy  Past Medical History:  Diagnosis Date  . Alcoholic cirrhosis of liver ([place])   . Alcoholism, chronic ([place])   . ANXIETY 06/23/2006  . Ascites 11/13/2009  . Ascites   . Avulsion fracture of middle phalanges of  3rd/4th fingers left hand 01/07/2014  . Blood dyscrasia    HIV  . Cellulitis of right lower extremity 03/02/2016  . DEPRESSION 06/23/2006  . 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 Erectile dysfunction 07/14/2015  . 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 Gastric ulcer 06/2010  . GERD (gastroesophageal reflux disease)   . HIV DISEASE 06/23/2006  . HYPERLIPIDEMIA 06/23/2006  . HYPERTENSION 06/23/2006   denies  . 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 Iron deficiency anemia   . Left foot infection   . Obesity (BMI 30-39.9)    BMI 34 kg/m^2  . Portal hypertensive gastropathy ([place]) 01/02/2013  . Recent shoulder injury    left shoulder/fell in parking lot/ no surgery/December 26, 2016  . SBP (spontaneous bacterial peritonitis) ([place]) 05/03/2011   Suspected by high leukocytes on paracentesis. Clinical scenario also compatible. November 2012 responded to Levaquin. Started on trimethoprim-sulfamethoxazole double strength daily for prophylaxis.   Marland Kitchen  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  . Skin cancer    left calf  . STRAIN, CHEST WALL 03/15/2007  . Varices, esophageal ([place]) 06/2010  . Venous stasis 03/02/2016    Past Surgical History:  Procedure Laterality Date  . CARPAL TUNNEL RELEASE     left  . COLONOSCOPY  march 2013  . ESOPHAGEAL BANDING  05/01/2013   Procedure: ESOPHAGEAL BANDING;  Surgeon: Gatha Mayer, MD;  Location: [facility];  Service: Endoscopy;;  . ESOPHAGOGASTRODUODENOSCOPY  07/01/2010;  08/12/10   small varices, gastric ulcer  . ESOPHAGOGASTRODUODENOSCOPY  10/26/2011   Procedure:  ESOPHAGOGASTRODUODENOSCOPY (EGD);  Surgeon: Gatha Mayer, MD;  Location: [facility];  Service: Endoscopy;  Laterality: N/A;  . ESOPHAGOGASTRODUODENOSCOPY N/A 01/02/2013   Procedure: ESOPHAGOGASTRODUODENOSCOPY (EGD);  Surgeon: Gatha Mayer, MD;  Location: [facility];  Service: Endoscopy;  Laterality: N/A;  . ESOPHAGOGASTRODUODENOSCOPY N/A 04/23/2013   Procedure: ESOPHAGOGASTRODUODENOSCOPY (EGD);  Surgeon: Gatha Mayer, MD;  Location: [facility];  Service: Endoscopy;  Laterality: N/A;  . ESOPHAGOGASTRODUODENOSCOPY N/A 05/01/2013   Procedure: ESOPHAGOGASTRODUODENOSCOPY (EGD);  Surgeon: Gatha Mayer, MD;  Location: [facility];  Service: Endoscopy;  Laterality: N/A;  follow-up varices and possibly band them  . ESOPHAGOGASTRODUODENOSCOPY N/A 09/04/2013   Procedure: ESOPHAGOGASTRODUODENOSCOPY (EGD);  Surgeon: Gatha Mayer, MD;  Location: [facility];  Service: Endoscopy;  Laterality: N/A;  . ESOPHAGOGASTRODUODENOSCOPY N/A 09/10/2014   Procedure: ESOPHAGOGASTRODUODENOSCOPY (EGD);  Surgeon: Gatha Mayer, MD;  Location: [facility];  Service: Endoscopy;  Laterality: N/A;  . ESOPHAGOGASTRODUODENOSCOPY (EGD) WITH PROPOFOL N/A 08/21/2015   Procedure: ESOPHAGOGASTRODUODENOSCOPY (EGD) WITH PROPOFOL;  Surgeon: Gatha Mayer, MD;  Location: [facility];  Service: Endoscopy;  Laterality: N/A;  . ESOPHAGOGASTRODUODENOSCOPY (EGD) WITH PROPOFOL N/A 04/06/2017   Procedure: ESOPHAGOGASTRODUODENOSCOPY (EGD) WITH PROPOFOL;  Surgeon: Gatha Mayer, MD;  Location: [facility];  Service: Endoscopy;  Laterality: N/A;  . ESOPHAGOGASTRODUODENOSCOPY W/ BANDING  06/26/2010   variceal ligation  . GASTRIC VARICES BANDING N/A 01/02/2013   Procedure: GASTRIC VARICES BANDING;  Surgeon: Gatha Mayer, MD;  Location: [facility];  Service: Endoscopy;  Laterality: N/A;  possible banding  . UPPER GASTROINTESTINAL ENDOSCOPY      Family History  Problem Relation Age of Onset  . Hyperlipidemia Mother   .  Hypertension Mother   . Breast cancer Mother        questionable  . Heart disease Mother   . Hypertension Father   . Irritable bowel syndrome Father   . Drug abuse Brother   . Heart disease Brother   . Breast cancer Maternal Aunt        maternal great aunt  . Alcohol abuse Other   . Heart disease Maternal Uncle   . Irritable bowel syndrome Paternal Aunt   . Colon cancer Neg Hx       Social History   Socioeconomic History  . Marital status: Single    Spouse name: None  . Number of children: 0  . Years of education: None  . Highest education level: None  Social Needs  . Financial resource strain: None  . Food insecurity - worry: None  . Food insecurity - inability: None  . Transportation needs - medical: None  . Transportation needs - non-medical: None  Occupational History  . Occupation: disability  Tobacco Use  . Smoking status: Former Smoker    Packs/day: 0.10    Years: 10.00    Pack years: 1.00    Types: Cigarettes    Start date: 03/03/2014  Last attempt to quit: 04/03/2014    Years since quitting: 3.3  . Smokeless tobacco: Never Used  . Tobacco comment: Chews nicorette gum.  Substance and Sexual Activity  . Alcohol use: No    Alcohol/week: 0.0 oz    Comment: pt is an alcoholic currently in [employer]  . Drug use: No  . Sexual activity: Yes    Partners: Male    Birth control/protection: Condom    Comment: pt. declined condoms  Other Topics Concern  . None  Social History Narrative   Single, disabled hair stylist   Lives with parents in a home with a basement.  Parents do not like for [name] to go down stairs because they are steep.            Allergies  Allergen Reactions  . Penicillins     REACTION: hives Has patient had a PCN reaction causing immediate rash, facial/tongue/throat swelling, SOB or lightheadedness with hypotension: Yes Has patient had a PCN reaction causing severe rash involving mucus membranes or skin necrosis: Yes Has  patient had a PCN reaction that required hospitalization No Has patient had a PCN reaction occurring within the last 10 years: Yes If all of the above answers are "NO", then may proceed with Cephalosporin use./Per pt makes [name] feel weird!   . Sulfa Antibiotics     Pt states he did not have a reaction last time to Sulfa!     Current Outpatient Medications:  .  ALPRAZolam (XANAX) 1 MG tablet, Take 1 tablet (1 mg total) by mouth 2 (two) times daily., Disp: 60 tablet, Rfl: 2 .  aMILoride (MIDAMOR) 5 MG tablet, TAKE 3 TABLETS(15 MG) BY MOUTH DAILY, Disp: 90 tablet, Rfl: 11 .  amitriptyline (ELAVIL) 100 MG tablet, TAKE 1 TABLET(100 MG) BY MOUTH AT BEDTIME, Disp: 30 tablet, Rfl: 5 .  bictegravir-emtricitabine-tenofovir AF ([name]) 50-200-25 MG TABS tablet, Take 1 tablet by mouth daily., Disp: 30 tablet, Rfl: 11 .  CONSTULOSE 10 GM/15ML solution, TAKE 60 ML BY MOUTH 4 TIMES DAILY, Disp: 3792 mL, Rfl: 5 .  Docusate Sodium (GNP STOOL SOFTENER EX ST PO), Take 1 tablet by mouth at bedtime., Disp: , Rfl:  .  esomeprazole (NEXIUM) 40 MG capsule, Take 1 capsule (40 mg total) by mouth daily before breakfast., Disp: 30 capsule, Rfl: 0 .  furosemide (LASIX) 40 MG tablet, TAKE 2 TABLETS(80 MG) BY MOUTH TWICE DAILY, Disp: 120 tablet, Rfl: 11 .  Polyethyl Glycol-Propyl Glycol (SYSTANE OP), Place 1 drop into both eyes 3 (three) times daily as needed (3). , Disp: , Rfl:  .  sildenafil (VIAGRA) 25 MG tablet, Take 1 tablet (25 mg total) by mouth daily as needed for erectile dysfunction., Disp: 10 tablet, Rfl: 4 .  Wheat Dextrin (BENEFIBER PO), Take 1 Dose by mouth daily as needed (for constipation.)., Disp: , Rfl:  .  XIFAXAN 550 MG TABS tablet, TAKE ONE TABLET BY MOUTH TWICE DAILY, Disp: 60 tablet, Rfl: 11 .  AMBULATORY NON FORMULARY MEDICATION, Lift Chair- Use as needed Dx: generalized weakness, hepatic encephalopathy, Disp: 1 each, Rfl: 0 .  dolutegravir (TIVICAY) 50 MG tablet, Take 1 tablet (50 mg total) by  mouth daily., Disp: 30 tablet, Rfl: 11 .  emtricitabine-tenofovir AF (DESCOVY) 200-25 MG tablet, Take 1 tablet by mouth daily., Disp: 30 tablet, Rfl: 11 .  guaiFENesin (MUCINEX) 600 MG 12 hr tablet, Take 600 mg by mouth 2 (two) times daily as needed (for congestion or cough). , Disp: , Rfl:  .  NEXIUM 40 MG capsule, TAKE 1 CAPSULE BY MOUTH EVERY DAY BEFORE BREAKFAST, Disp: 30 capsule, Rfl: 11 .  potassium chloride SA (K-DUR,KLOR-CON) 20 MEQ tablet, TAKE 2 TABLETS BY MOUTH EVERY DAY (Patient taking differently: TAKE 1 TABLETS BY MOUTH TWICE DAILY), Disp: 60 tablet, Rfl: 11 .  senna (SENOKOT) 8.6 MG TABS tablet, TAKE 2 TABLETS(17.2 MG) BY MOUTH DAILY. START WITH 1 TABLET (Patient taking differently: TAKE 1 TABLETS(17.2 MG) BY MOUTH TWICE DAILY. START WITH 1 TABLET), Disp: 60 tablet, Rfl: 0 .  Simethicone (GAS-X PO), Take 1-2 tablets by mouth 3 (three) times daily as needed (for flatulence). , Disp: , Rfl:  .  sodium phosphate (FLEET) enema, Place 1 enema rectally at bedtime as needed (for constipation.). , Disp: , Rfl:  .  SSD 1 % cream, Apply 1 application topically daily as needed (Fever blisters.). , Disp: , Rfl: 0 .  traMADol (ULTRAM) 50 MG tablet, Take 50 mg by mouth every 4 (four) hours as needed. For pain., Disp: , Rfl: 0 .  triamcinolone cream (KENALOG) 0.1 %, APPLY TO AFFECTED AREA DAILY AS NEEDED FOR ITCHING OR RASH, Disp: , Rfl: 1   Review of Systems  Constitutional: Positive for diaphoresis. Negative for chills, fever and unexpected weight change.  HENT: Negative for congestion, rhinorrhea, sinus pressure, sneezing, sore throat and trouble swallowing.   Eyes: Negative for photophobia and visual disturbance.  Respiratory: Negative for cough, chest tightness, shortness of breath, wheezing and stridor.   Cardiovascular: Negative for chest pain, palpitations and leg swelling.  Gastrointestinal: Negative for abdominal distention, abdominal pain, anal bleeding, blood in stool, constipation,  diarrhea, nausea and vomiting.  Genitourinary: Negative for difficulty urinating, dysuria, flank pain and hematuria.  Musculoskeletal: Negative for arthralgias, back pain, joint swelling and myalgias.  Skin: Negative for pallor, rash and wound.  Neurological: Negative for dizziness, tremors, seizures and light-headedness.  Hematological: Negative for adenopathy. Does not bruise/bleed easily.  Psychiatric/Behavioral: Positive for dysphoric mood. Negative for agitation, behavioral problems and decreased concentration. The patient is not nervous/anxious.        Objective:   Physical Exam  Constitutional: He is oriented to person, place, and time. He appears well-developed and well-nourished. No distress.  HENT:  Head: Normocephalic and atraumatic.    Mouth/Throat: Oropharynx is clear and moist. No oropharyngeal exudate.  Eyes: Conjunctivae and EOM are normal. Pupils are equal, round, and reactive to light. No scleral icterus.  Neck: Normal range of motion. Neck supple. No JVD present.  Cardiovascular: Normal rate, regular rhythm and normal heart sounds. Exam reveals no gallop and no friction rub.  No murmur heard. Pulmonary/Chest: Effort normal and breath sounds normal. No respiratory distress. He has no wheezes.  Abdominal: Soft. There is no tenderness.  Musculoskeletal: He exhibits no edema or tenderness.       Hands: Lymphadenopathy:    He has no cervical adenopathy.  Neurological: He is alert and oriented to person, place, and time. No sensory deficit. He exhibits normal muscle tone. Gait normal.  Skin: Skin is warm and dry. He is not diaphoretic. No pallor.  Psychiatric: Thought content normal. His mood appears anxious. His speech is delayed. He is slowed. He exhibits a depressed mood.        Assessment & Plan:   Mechanical fall: There is no evidence of syncope no evidence of loss of consciousness I do not think he needs further aggressive workup at this time but is counseled  that should he have any new symptoms of unsteadiness  or worsening headaches or change in his clinical condition he should let us know immediately.  Facial abrasion laceration on hand and hematoma: There is no indication for urgent imaging or intervention he should watch the sites and also watch for any other situations in which his sense of balance could put [name] in danger.  Depression and problems with body image and anxiety: We will engage with [name] counselors once we have [name] full complement on board   Etoh induced cirrhosis with varices: followed by [facility], Dr. Carlean Purl s EGD last week.  Encephalopathy: taking lactulose he claims.  We will check a pneumonia level today for what it is worth  Sweating: I do not think there is anything to do with his antiretrovirals, being sweats said we can change [name] back to [facility] and DESCOVY

## 2017-08-23 ENCOUNTER — Other Ambulatory Visit: Payer: Self-pay | Admitting: Infectious Disease

## 2017-08-23 ENCOUNTER — Telehealth: Payer: Self-pay | Admitting: Infectious Disease

## 2017-08-23 LAB — LIPID PANEL
Cholesterol: 152 mg/dL (ref <200)
HDL: 56 mg/dL (ref >40)
LDL Cholesterol (Calc): 82 mg/dL (calc)
NON-HDL CHOLESTEROL (CALC): 96 mg/dL (ref <130)
Total CHOL/HDL Ratio: 2.7 (calc) (ref <5.0)
Triglycerides: 68 mg/dL (ref <150)

## 2017-08-23 LAB — COMPLETE METABOLIC PANEL WITH GFR
AG RATIO: 1.5 (calc) (ref 1.0–2.5)
ALT: 20 U/L (ref 9–46)
AST: 28 U/L (ref 10–35)
Albumin: 4.1 g/dL (ref 3.6–5.1)
Alkaline phosphatase (APISO): 111 U/L (ref 40–115)
BUN: 17 mg/dL (ref 7–25)
CO2: 26 mmol/L (ref 20–32)
Calcium: 9.5 mg/dL (ref 8.6–10.3)
Chloride: 103 mmol/L (ref 98–110)
Creat: 1.21 mg/dL (ref 0.70–1.33)
GFR, Est African American: 79 mL/min/{1.73_m2} (ref >=60)
GFR, Est Non African American: 68 mL/min/{1.73_m2} (ref >=60)
Globulin: 2.7 g/dL (calc) (ref 1.9–3.7)
Glucose, Bld: 120 mg/dL — ABNORMAL HIGH (ref 65–99)
POTASSIUM: 2.9 mmol/L — AB (ref 3.5–5.3)
Sodium: 139 mmol/L (ref 135–146)
TOTAL PROTEIN: 6.8 g/dL (ref 6.1–8.1)
Total Bilirubin: 1.1 mg/dL (ref 0.2–1.2)

## 2017-08-23 LAB — CBC WITH DIFFERENTIAL/PLATELET
BASOS ABS: 29 {cells}/uL (ref 0–200)
Basophils Relative: 0.7 %
EOS PCT: 2.8 %
Eosinophils Absolute: 118 cells/uL (ref 15–500)
HCT: 37.7 % — ABNORMAL LOW (ref 38.5–50.0)
Hemoglobin: 12.7 g/dL — ABNORMAL LOW (ref 13.2–17.1)
LYMPHS ABS: 1016 {cells}/uL (ref 850–3900)
MCH: 26.6 pg — ABNORMAL LOW (ref 27.0–33.0)
MCHC: 33.7 g/dL (ref 32.0–36.0)
MCV: 79 fL — ABNORMAL LOW (ref 80.0–100.0)
MPV: 11.4 fL (ref 7.5–12.5)
Monocytes Relative: 7.8 %
Neutro Abs: 2709 cells/uL (ref 1500–7800)
Neutrophils Relative %: 64.5 %
PLATELETS: 95 10*3/uL — AB (ref 140–400)
RBC: 4.77 10*6/uL (ref 4.20–5.80)
RDW: 15.4 % — AB (ref 11.0–15.0)
TOTAL LYMPHOCYTE: 24.2 %
WBC mixed population: 328 cells/uL (ref 200–950)
WBC: 4.2 10*3/uL (ref 3.8–10.8)

## 2017-08-23 LAB — T-HELPER CELL (CD4) - (RCID CLINIC ONLY)
CD4 T CELL HELPER: 26 % — AB (ref 33–55)
CD4 T Cell Abs: 280 /uL — ABNORMAL LOW (ref 400–2700)

## 2017-08-23 LAB — RPR: RPR: NONREACTIVE

## 2017-08-23 MED ORDER — POTASSIUM CHLORIDE CRYS ER 20 MEQ PO TBCR: 40.0000 meq | EXTENDED_RELEASE_TABLET | Freq: Two times a day (BID) | ORAL | 11 refills | Status: DC

## 2017-08-23 NOTE — Telephone Encounter (Signed)
I noticed that [name] is supposed to be taking 40 meq of KCL twice daily along with his lasix 80mg  bid  Can we :  Find out if he has been taking his potassium at all?  If he has been taking his lasix (I assuime he has)  If he has been taking the above as prescribed I would have him hold his LASIX AND DOUBLE HI S POTASSIUM INTAKE TO 40 BID AND HAVE REPEAT BMP NEXT Monday  He should also see PCP vs Dr. Carlean Purl re longer term management plan of his diuretics

## 2017-08-23 NOTE — Telephone Encounter (Signed)
Spoke with [name].  He thinks he takes his potassium twice daily, although called it his "calcium pill" a few times in the conversation.  He states his mother fills his pill box. I asked to speak with her to relay the instructions, but he said she was not there. [name] will skip his Lasix Wednesday and Thursday, will take his potassium 40 meq twice daily as currently prescribed. Routing BMP to Dr Carlean Purl per his request for follow up. Landis Gandy, RN

## 2017-08-23 NOTE — Progress Notes (Signed)
Sounds like a good plan though Rx says 40 meq bid not qd  Please route the f/u BMP to me and I will take it from there

## 2017-08-24 ENCOUNTER — Telehealth: Payer: Self-pay | Admitting: Internal Medicine

## 2017-08-24 DIAGNOSIS — E876 Hypokalemia: Secondary | ICD-10-CM

## 2017-08-24 DIAGNOSIS — K7031 Alcoholic cirrhosis of liver with ascites: Secondary | ICD-10-CM

## 2017-08-24 NOTE — Telephone Encounter (Signed)
I thought that ID was ordering and sending results to me  Last I saw in chart

## 2017-08-24 NOTE — Telephone Encounter (Signed)
Mom updated on instructions to hold furosemide per notes from Dr. Cristela Blue and Harvin Hazel, RN.  Dr. Carlean Purl are we going to order the follow up BMET? Do you want it on Monday?

## 2017-08-24 NOTE — Telephone Encounter (Signed)
Mom notified that they will need to go to Dr. Bernita Raisin office on Monday for labs

## 2017-08-25 ENCOUNTER — Telehealth: Payer: Self-pay | Admitting: Internal Medicine

## 2017-08-25 NOTE — Telephone Encounter (Signed)
Patient's mom asked again about instructions for holding furosemide.  He is requesting  A refill of xanax.  He is asked to contact his pharmacy and they will send a request. I reviewed with the patient and his mother to hold furosemide today, resume tomorrow, and go to Dr. Lucianne Lei Damm's office on Monday for labs

## 2017-08-25 NOTE — Telephone Encounter (Signed)
Patient states he has some questions for nurse [facility] regarding recent labs.

## 2017-08-26 ENCOUNTER — Ambulatory Visit (INDEPENDENT_AMBULATORY_CARE_PROVIDER_SITE_OTHER): Payer: Self-pay | Admitting: Orthopedic Surgery

## 2017-08-26 ENCOUNTER — Other Ambulatory Visit: Payer: Self-pay | Admitting: *Deleted

## 2017-08-26 DIAGNOSIS — E876 Hypokalemia: Secondary | ICD-10-CM

## 2017-08-26 MED ORDER — ALPRAZOLAM 1 MG PO TABS: 1.0000 mg | ORAL_TABLET | Freq: Two times a day (BID) | ORAL | 2 refills | Status: DC

## 2017-08-26 NOTE — Telephone Encounter (Signed)
Xanax rx printed, signed and faxed to [facility] in [place] at [phone].

## 2017-08-26 NOTE — Addendum Note (Signed)
Addended by: [place], Syon Tews E on: 08/26/2017 02:07 PM   Modules accepted: Orders

## 2017-08-26 NOTE — Telephone Encounter (Signed)
I confirmed drug store, they want it sent to [facility] in [place]  If approved.

## 2017-08-26 NOTE — Telephone Encounter (Signed)
Ok to refill today x 3 total

## 2017-08-26 NOTE — Telephone Encounter (Signed)
Was able to speak with [name]'s mother. She will bring him Monday at 9:30 for repeat BMP.  SHe is hoping to get the abdominal ultrasound done at the same time. RN gave her the number to [place] - [phone]. Landis Gandy, RN

## 2017-08-26 NOTE — Addendum Note (Signed)
Addended by: Landis Gandy on: 08/26/2017 12:18 PM   Modules accepted: Orders

## 2017-08-26 NOTE — Telephone Encounter (Signed)
Pt is requesting a refill of Xanax be sent to [facility] in [place]. Pt is also requesting to speak with a nurse about lab work he had done with Dr.Van Dam's office.

## 2017-08-26 NOTE — Telephone Encounter (Signed)
Thanks Michelle

## 2017-08-26 NOTE — Telephone Encounter (Signed)
I spoke with Enid Derry (mom) and she said they talked with Dr Lucianne Lei Dam's office and got their lab work questions answered this AM. She is requesting the xanax to be refilled, she said its a few days early but she wanted to go up there today. I told her we'll have to route to Dr Carlean Purl to approve.

## 2017-08-29 ENCOUNTER — Other Ambulatory Visit: Payer: Medicaid Other

## 2017-08-29 DIAGNOSIS — E876 Hypokalemia: Secondary | ICD-10-CM

## 2017-08-29 LAB — BASIC METABOLIC PANEL
BUN: 15 mg/dL (ref 7–25)
CO2: 28 mmol/L (ref 20–32)
Calcium: 9.5 mg/dL (ref 8.6–10.3)
Chloride: 103 mmol/L (ref 98–110)
Creat: 1.02 mg/dL (ref 0.70–1.33)
Glucose, Bld: 91 mg/dL (ref 65–99)
Potassium: 3.3 mmol/L — ABNORMAL LOW (ref 3.5–5.3)
SODIUM: 140 mmol/L (ref 135–146)

## 2017-08-29 LAB — HIV RNA, RTPCR W/R GT (RTI, PI,INT)
HIV 1 RNA Quant: <20 copies/mL
HIV-1 RNA QUANT, LOG: NOT DETECTED {Log_copies}/mL

## 2017-08-30 ENCOUNTER — Telehealth: Payer: Self-pay

## 2017-08-30 MED ORDER — AMITRIPTYLINE HCL 100 MG PO TABS: ORAL_TABLET | ORAL | 11 refills | Status: DC

## 2017-08-30 NOTE — Telephone Encounter (Signed)
[facility] in [place] sent request for patients amitriptyline 100 mg, one daily at bedtime. Please advise how many refills [name], thanks.

## 2017-08-30 NOTE — Telephone Encounter (Signed)
Amitriptyline sent in as requested.

## 2017-08-30 NOTE — Telephone Encounter (Signed)
12 - 1 year

## 2017-09-01 ENCOUNTER — Telehealth: Payer: Self-pay

## 2017-09-01 MED ORDER — AMILORIDE HCL 5 MG PO TABS: ORAL_TABLET | ORAL | 11 refills | Status: DC

## 2017-09-01 NOTE — Telephone Encounter (Signed)
Received refill request from [facility] in [place] for patients amiloride 5 mg tablets. Please advise [name]?

## 2017-09-01 NOTE — Telephone Encounter (Signed)
Amiloride refilled as approved.

## 2017-09-01 NOTE — Telephone Encounter (Signed)
Refill x 1 year 

## 2017-09-06 ENCOUNTER — Telehealth: Payer: Self-pay | Admitting: Internal Medicine

## 2017-09-06 MED FILL — TIVICAY 50 MG TABLET: 50 | 30 days supply | Qty: 30 | Fill #0

## 2017-09-06 MED FILL — DESCOVY 200-25 MG TABS: 200-25 | 30 days supply | Qty: 30 | Fill #0

## 2017-09-06 NOTE — Telephone Encounter (Signed)
Left her a message to call me back. 

## 2017-09-07 NOTE — Telephone Encounter (Signed)
Left her another voice mail to call me back.

## 2017-09-09 ENCOUNTER — Ambulatory Visit (INDEPENDENT_AMBULATORY_CARE_PROVIDER_SITE_OTHER): Payer: Self-pay | Admitting: Orthopedic Surgery

## 2017-09-15 NOTE — Telephone Encounter (Signed)
Left another voice mail for [name] to call me back. Tried to reach her on the mobile # and the voice mail is full and not accepting new messages.
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
## 2017-09-19 NOTE — Telephone Encounter (Signed)
Please advise [name], thank you. 

## 2017-09-20 MED ORDER — ALPRAZOLAM 1 MG PO TABS: 1.0000 mg | ORAL_TABLET | Freq: Two times a day (BID) | ORAL | 2 refills | Status: DC

## 2017-09-20 NOTE — Telephone Encounter (Signed)
OK to refill early but call the pharmacy and double check and explain  Sorry for his loss  Refill x 3

## 2017-09-20 NOTE — Telephone Encounter (Signed)
Spoke with the [facility] in [place] the pharmacist said she will try to over ride it for him but that he's medicaid so they may not let her.  She said he can pay cash.  I spoke to Bodie and told him what I had done. He said he will see [place] soon and thanks for the prayers. I faxed the rx to the pharmacy.

## 2017-09-20 NOTE — Telephone Encounter (Signed)
Okay to refill his xanax [name]?

## 2017-09-20 NOTE — Telephone Encounter (Signed)
Pt calling to check on the status of this request.

## 2017-09-28 ENCOUNTER — Telehealth: Payer: Self-pay

## 2017-09-28 ENCOUNTER — Ambulatory Visit: Payer: Medicaid Other | Admitting: Infectious Disease

## 2017-09-28 NOTE — Telephone Encounter (Signed)
Pharmacy requesting refill on patients furosemide 40 mg tablets , 2 BID. Please advise [name]?

## 2017-09-29 MED ORDER — FUROSEMIDE 40 MG PO TABS: ORAL_TABLET | ORAL | 11 refills | Status: DC

## 2017-09-29 MED FILL — TIVICAY 50 MG TABLET: 50 | 30 days supply | Qty: 30 | Fill #1

## 2017-09-29 MED FILL — DESCOVY 200-25 MG TABS: 200-25 | 30 days supply | Qty: 30 | Fill #1

## 2017-09-29 NOTE — Telephone Encounter (Signed)
Refill x 1 year 

## 2017-09-29 NOTE — Telephone Encounter (Signed)
Furosemide 40mg  tablets refilled as approved.

## 2017-10-02 ENCOUNTER — Other Ambulatory Visit: Payer: Self-pay

## 2017-10-02 ENCOUNTER — Encounter (HOSPITAL_COMMUNITY): Payer: Self-pay | Admitting: *Deleted

## 2017-10-02 ENCOUNTER — Inpatient Hospital Stay (HOSPITAL_COMMUNITY)
Admission: EM | Admit: 2017-10-02 | Discharge: 2017-10-05 | DRG: 433 | Disposition: A | Discharge status: Other Acute Inpatient Hospital | Payer: Medicaid Other | Attending: Family Medicine | Admitting: Family Medicine

## 2017-10-02 ENCOUNTER — Emergency Department (HOSPITAL_COMMUNITY): Payer: Medicaid Other

## 2017-10-02 DIAGNOSIS — I8510 Secondary esophageal varices without bleeding: Secondary | ICD-10-CM | POA: Diagnosis present

## 2017-10-02 DIAGNOSIS — K7682 Hepatic encephalopathy: Secondary | ICD-10-CM

## 2017-10-02 DIAGNOSIS — E785 Hyperlipidemia, unspecified: Secondary | ICD-10-CM | POA: Diagnosis present

## 2017-10-02 DIAGNOSIS — K7469 Other cirrhosis of liver: Secondary | ICD-10-CM | POA: Diagnosis not present

## 2017-10-02 DIAGNOSIS — Z813 Family history of other psychoactive substance abuse and dependence: Secondary | ICD-10-CM | POA: Diagnosis not present

## 2017-10-02 DIAGNOSIS — R4182 Altered mental status, unspecified: Secondary | ICD-10-CM | POA: Diagnosis not present

## 2017-10-02 DIAGNOSIS — F1020 Alcohol dependence, uncomplicated: Secondary | ICD-10-CM | POA: Diagnosis present

## 2017-10-02 DIAGNOSIS — K7040 Alcoholic hepatic failure without coma: Secondary | ICD-10-CM | POA: Diagnosis not present

## 2017-10-02 DIAGNOSIS — T424X2A Poisoning by benzodiazepines, intentional self-harm, initial encounter: Secondary | ICD-10-CM | POA: Diagnosis present

## 2017-10-02 DIAGNOSIS — Z6841 Body Mass Index (BMI) 40.0 and over, adult: Secondary | ICD-10-CM | POA: Diagnosis not present

## 2017-10-02 DIAGNOSIS — T1491XA Suicide attempt, initial encounter: Secondary | ICD-10-CM | POA: Diagnosis not present

## 2017-10-02 DIAGNOSIS — F332 Major depressive disorder, recurrent severe without psychotic features: Secondary | ICD-10-CM | POA: Diagnosis present

## 2017-10-02 DIAGNOSIS — Z79899 Other long term (current) drug therapy: Secondary | ICD-10-CM | POA: Diagnosis not present

## 2017-10-02 DIAGNOSIS — Z811 Family history of alcohol abuse and dependence: Secondary | ICD-10-CM | POA: Diagnosis not present

## 2017-10-02 DIAGNOSIS — R451 Restlessness and agitation: Secondary | ICD-10-CM | POA: Diagnosis not present

## 2017-10-02 DIAGNOSIS — T510X2A Toxic effect of ethanol, intentional self-harm, initial encounter: Secondary | ICD-10-CM | POA: Diagnosis not present

## 2017-10-02 DIAGNOSIS — R45851 Suicidal ideations: Secondary | ICD-10-CM

## 2017-10-02 DIAGNOSIS — D61818 Other pancytopenia: Secondary | ICD-10-CM | POA: Diagnosis present

## 2017-10-02 DIAGNOSIS — K219 Gastro-esophageal reflux disease without esophagitis: Secondary | ICD-10-CM | POA: Diagnosis present

## 2017-10-02 DIAGNOSIS — R450 Nervousness: Secondary | ICD-10-CM | POA: Diagnosis not present

## 2017-10-02 DIAGNOSIS — Z85828 Personal history of other malignant neoplasm of skin: Secondary | ICD-10-CM | POA: Diagnosis not present

## 2017-10-02 DIAGNOSIS — K7290 Hepatic failure, unspecified without coma: Secondary | ICD-10-CM | POA: Diagnosis present

## 2017-10-02 DIAGNOSIS — Z87891 Personal history of nicotine dependence: Secondary | ICD-10-CM

## 2017-10-02 DIAGNOSIS — B20 Human immunodeficiency virus [HIV] disease: Secondary | ICD-10-CM | POA: Diagnosis present

## 2017-10-02 DIAGNOSIS — G8929 Other chronic pain: Secondary | ICD-10-CM | POA: Diagnosis not present

## 2017-10-02 DIAGNOSIS — I10 Essential (primary) hypertension: Secondary | ICD-10-CM | POA: Diagnosis present

## 2017-10-02 DIAGNOSIS — Z736 Limitation of activities due to disability: Secondary | ICD-10-CM | POA: Diagnosis not present

## 2017-10-02 DIAGNOSIS — F4322 Adjustment disorder with anxiety: Secondary | ICD-10-CM | POA: Diagnosis present

## 2017-10-02 DIAGNOSIS — F04 Amnestic disorder due to known physiological condition: Secondary | ICD-10-CM | POA: Diagnosis not present

## 2017-10-02 DIAGNOSIS — R413 Other amnesia: Secondary | ICD-10-CM | POA: Diagnosis not present

## 2017-10-02 DIAGNOSIS — F419 Anxiety disorder, unspecified: Secondary | ICD-10-CM | POA: Diagnosis not present

## 2017-10-02 DIAGNOSIS — G9009 Other idiopathic peripheral autonomic neuropathy: Secondary | ICD-10-CM | POA: Diagnosis present

## 2017-10-02 DIAGNOSIS — Z634 Disappearance and death of family member: Secondary | ICD-10-CM | POA: Diagnosis not present

## 2017-10-02 DIAGNOSIS — K7030 Alcoholic cirrhosis of liver without ascites: Secondary | ICD-10-CM | POA: Diagnosis present

## 2017-10-02 DIAGNOSIS — E876 Hypokalemia: Secondary | ICD-10-CM | POA: Diagnosis not present

## 2017-10-02 DIAGNOSIS — R748 Abnormal levels of other serum enzymes: Secondary | ICD-10-CM | POA: Diagnosis not present

## 2017-10-02 LAB — CBC WITH DIFFERENTIAL/PLATELET
BASOS PCT: 0 %
Basophils Absolute: 0 10*3/uL (ref 0.0–0.1)
Eosinophils Absolute: 0.1 10*3/uL (ref 0.0–0.7)
Eosinophils Relative: 3 %
HEMATOCRIT: 35.1 % — AB (ref 39.0–52.0)
Hemoglobin: 11.3 g/dL — ABNORMAL LOW (ref 13.0–17.0)
Lymphocytes Relative: 25 %
Lymphs Abs: 0.6 10*3/uL — ABNORMAL LOW (ref 0.7–4.0)
MCH: 27 pg (ref 26.0–34.0)
MCHC: 32.2 g/dL (ref 30.0–36.0)
MCV: 84 fL (ref 78.0–100.0)
Monocytes Absolute: 0.3 10*3/uL (ref 0.1–1.0)
Monocytes Relative: 12 %
NEUTROS ABS: 1.4 10*3/uL — AB (ref 1.7–7.7)
NEUTROS PCT: 60 %
Platelets: 66 10*3/uL — ABNORMAL LOW (ref 150–400)
RBC: 4.18 MIL/uL — ABNORMAL LOW (ref 4.22–5.81)
RDW: 16.4 % — ABNORMAL HIGH (ref 11.5–15.5)
WBC: 2.4 10*3/uL — ABNORMAL LOW (ref 4.0–10.5)

## 2017-10-02 LAB — COMPREHENSIVE METABOLIC PANEL
ALBUMIN: 3.7 g/dL (ref 3.5–5.0)
ALT: 20 U/L (ref 17–63)
ANION GAP: 12 (ref 5–15)
AST: 28 U/L (ref 15–41)
Alkaline Phosphatase: 112 U/L (ref 38–126)
BUN: 15 mg/dL (ref 6–20)
CO2: 25 mmol/L (ref 22–32)
Calcium: 9.6 mg/dL (ref 8.9–10.3)
Chloride: 105 mmol/L (ref 101–111)
Creatinine, Ser: 1.17 mg/dL (ref 0.61–1.24)
GFR calc Af Amer: >60 mL/min (ref >60)
GFR calc non Af Amer: >60 mL/min (ref >60)
GLUCOSE: 107 mg/dL — AB (ref 65–99)
Potassium: 3.9 mmol/L (ref 3.5–5.1)
SODIUM: 142 mmol/L (ref 135–145)
Total Bilirubin: 1.2 mg/dL (ref 0.3–1.2)
Total Protein: 6.5 g/dL (ref 6.5–8.1)

## 2017-10-02 LAB — ACETAMINOPHEN LEVEL: Acetaminophen (Tylenol), Serum: <10 ug/mL — ABNORMAL LOW (ref 10–30)

## 2017-10-02 LAB — SALICYLATE LEVEL

## 2017-10-02 LAB — AMMONIA: AMMONIA: 94 umol/L — AB (ref 9–35)

## 2017-10-02 LAB — ETHANOL: Alcohol, Ethyl (B): <10 mg/dL (ref <10)

## 2017-10-02 MED ORDER — DOLUTEGRAVIR SODIUM 50 MG PO TABS
50.0000 mg | ORAL_TABLET | Freq: Every day | ORAL | Status: DC
  Administered 2017-10-04 – 2017-10-05 (×2): 50 mg via ORAL
  Filled 2017-10-02 (×3): qty 1

## 2017-10-02 MED ORDER — FUROSEMIDE 40 MG PO TABS: 40.0000 mg | ORAL_TABLET | Freq: Two times a day (BID) | ORAL | Status: DC

## 2017-10-02 MED ORDER — RIFAXIMIN 550 MG PO TABS
550.0000 mg | ORAL_TABLET | Freq: Two times a day (BID) | ORAL | Status: DC
  Administered 2017-10-03 – 2017-10-05 (×5): 550 mg via ORAL
  Filled 2017-10-02 (×7): qty 1

## 2017-10-02 MED ORDER — ACETAMINOPHEN 650 MG RE SUPP: 650.0000 mg | Freq: Four times a day (QID) | RECTAL | Status: DC | PRN

## 2017-10-02 MED ORDER — ACETAMINOPHEN 325 MG PO TABS
650.0000 mg | ORAL_TABLET | Freq: Four times a day (QID) | ORAL | Status: DC | PRN
  Administered 2017-10-03: 650 mg via ORAL
  Filled 2017-10-02: qty 2

## 2017-10-02 MED ORDER — LACTULOSE 10 GM/15ML PO SOLN
30.0000 g | Freq: Once | ORAL | Status: AC
  Administered 2017-10-02: 30 g via ORAL
  Filled 2017-10-02: qty 60

## 2017-10-02 MED ORDER — AMILORIDE HCL 5 MG PO TABS
15.0000 mg | ORAL_TABLET | Freq: Every day | ORAL | Status: DC
  Administered 2017-10-04 – 2017-10-05 (×2): 15 mg via ORAL
  Filled 2017-10-02 (×3): qty 3

## 2017-10-02 MED ORDER — SENNA 8.6 MG PO TABS
1.0000 | ORAL_TABLET | Freq: Three times a day (TID) | ORAL | Status: DC
  Administered 2017-10-03: 8.6 mg via ORAL
  Filled 2017-10-02: qty 1

## 2017-10-02 MED ORDER — SODIUM CHLORIDE 0.9% FLUSH
3.0000 mL | Freq: Two times a day (BID) | INTRAVENOUS | Status: DC
  Administered 2017-10-03 – 2017-10-05 (×5): 3 mL via INTRAVENOUS

## 2017-10-02 MED ORDER — POTASSIUM CHLORIDE CRYS ER 10 MEQ PO TBCR
40.0000 meq | EXTENDED_RELEASE_TABLET | Freq: Two times a day (BID) | ORAL | Status: DC
  Administered 2017-10-03 – 2017-10-05 (×5): 40 meq via ORAL
  Filled 2017-10-02 (×6): qty 4

## 2017-10-02 MED ORDER — FLEET ENEMA 7-19 GM/118ML RE ENEM: 1.0000 | ENEMA | Freq: Every evening | RECTAL | Status: DC | PRN

## 2017-10-02 MED ORDER — EMTRICITABINE-TENOFOVIR AF 200-25 MG PO TABS
1.0000 | ORAL_TABLET | Freq: Every day | ORAL | Status: DC
  Administered 2017-10-04 – 2017-10-05 (×2): 1 via ORAL
  Filled 2017-10-02 (×3): qty 1

## 2017-10-02 MED ORDER — LACTULOSE 10 GM/15ML PO SOLN
60.0000 g | Freq: Four times a day (QID) | ORAL | Status: DC
  Administered 2017-10-03 – 2017-10-05 (×8): 60 g via ORAL
  Filled 2017-10-02 (×13): qty 90

## 2017-10-02 MED ORDER — SODIUM CHLORIDE 0.9% FLUSH: 3.0000 mL | INTRAVENOUS | Status: DC | PRN

## 2017-10-02 MED ORDER — FUROSEMIDE 10 MG/ML IJ SOLN
40.0000 mg | Freq: Every day | INTRAMUSCULAR | Status: DC
  Administered 2017-10-03 – 2017-10-05 (×3): 40 mg via INTRAVENOUS
  Filled 2017-10-02 (×3): qty 4

## 2017-10-02 MED ORDER — SODIUM CHLORIDE 0.9 % IV SOLN: 250.0000 mL | INTRAVENOUS | Status: DC | PRN

## 2017-10-02 MED ORDER — PANTOPRAZOLE SODIUM 40 MG PO TBEC
40.0000 mg | DELAYED_RELEASE_TABLET | Freq: Every day | ORAL | Status: DC
  Administered 2017-10-04 – 2017-10-05 (×2): 40 mg via ORAL
  Filled 2017-10-02 (×2): qty 1

## 2017-10-02 MED ORDER — SIMETHICONE 80 MG PO CHEW
80.0000 mg | CHEWABLE_TABLET | Freq: Three times a day (TID) | ORAL | Status: DC | PRN
Start: 2017-10-02 — End: 2017-10-05
  Administered 2017-10-04: 80 mg via ORAL
  Filled 2017-10-02: qty 1

## 2017-10-02 NOTE — ED Triage Notes (Addendum)
EMs reports pts mother died recently, pt has been depressed, drinking large amount taken 60 Xanax since 09/20/17. Today he can not stand. Family found ETOH and poured it out along with finding empty Xanax. Pt has IV received 900 ml fluid

## 2017-10-02 NOTE — ED Notes (Signed)
Patient transported to CT 

## 2017-10-02 NOTE — H&P (Addendum)
TRH H&P   Patient Demographics:    [name], is a 52 y.o. male  MRN: [id number]   DOB - Mar 09, 1966  Admit Date - 10/02/2017  Outpatient Primary MD for the patient is Greig Right, MD  Referring MD/NP/PA: Marye Round  Outpatient Specialists:  Silvano Rusk (GI) Verne Spurr  Patient coming from:  home  Chief Complaint  Patient presents with  . Weakness      HPI:    [name]  is a 52 y.o. male, w Hiv, Etoh cirrhosis apparently had his mother pass and his family (cousin) was telling him that she was stressed due to taking care of him and that this contributed to her death. Pt was feeling guilty and started drinking and apparently has become more somnolent.  Pt has been taking xanax more frequently as well .  Pt was brought to ED for AMS.   InEd,  CT brain  IMPRESSION: No acute intracranial abnormality.  Na 142, K 3.9, Bun 15, Creatinine 1.17 Ast 28, Alt 20 Etoh <10 Wbc 2.4, Hgb 11.3, Plt 66 Tylenol <10 Ammonia 94  Pt will be admitted for altered mental status secondary to hepatic encephalopathy    Review of systems:    In addition to the HPI above,  No Fever-chills, No Headache, No changes with Vision or hearing, No problems swallowing food or Liquids, No Chest pain, Cough or Shortness of Breath, No Abdominal pain, No Nausea or Vommitting, Bowel movements are regular, No Blood in stool or Urine, No dysuria, No new skin rashes or bruises, No new joints pains-aches,  No new weakness, tingling, numbness in any extremity, No recent weight gain or loss, No polyuria, polydypsia or polyphagia, No significant Mental Stressors.  A full 10 point Review of Systems was done, except as stated above, all other Review of Systems were negative.   With Past History of the following :    Past Medical History:  Diagnosis Date  . Alcoholic cirrhosis of  liver ([place])   . Alcoholism, chronic ([place])   . ANXIETY 06/23/2006  . Ascites 11/13/2009  . Ascites   . Avulsion fracture of middle phalanges of  3rd/4th fingers left hand 01/07/2014  . Blood dyscrasia    HIV  . Cellulitis of right lower extremity 03/02/2016  . DEPRESSION 06/23/2006  . 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 Erectile dysfunction 07/14/2015  . 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 Gastric ulcer 06/2010  . GERD (gastroesophageal reflux disease)   . HIV DISEASE 06/23/2006  . HYPERLIPIDEMIA 06/23/2006  . HYPERTENSION 06/23/2006   denies  . 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 Iron deficiency anemia   . Left foot infection   . Obesity (BMI 30-39.9)    BMI 34 kg/m^2  . Portal hypertensive gastropathy ([place]) 01/02/2013  . Recent shoulder injury    left shoulder/fell in parking lot/ no surgery/December 26, 2016  .  SBP (spontaneous bacterial peritonitis) ([place]) 05/03/2011   Suspected by high leukocytes on paracentesis. Clinical scenario also compatible. November 2012 responded to Levaquin. Started on trimethoprim-sulfamethoxazole double strength daily for prophylaxis.   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 Skin cancer    left calf  . STRAIN, CHEST WALL 03/15/2007  . 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 Venous stasis 03/02/2016      Past Surgical History:  Procedure Laterality Date  . CARPAL TUNNEL RELEASE     left  . COLONOSCOPY  march 2013  . ESOPHAGEAL BANDING  05/01/2013   Procedure: ESOPHAGEAL BANDING;  Surgeon: Gatha Mayer, MD;  Location: [facility];  Service: Endoscopy;;  . ESOPHAGOGASTRODUODENOSCOPY  07/01/2010;  08/12/10   small varices, gastric ulcer  . ESOPHAGOGASTRODUODENOSCOPY  10/26/2011   Procedure: ESOPHAGOGASTRODUODENOSCOPY (EGD);  Surgeon: Gatha Mayer, MD;  Location: [facility];  Service: Endoscopy;  Laterality: N/A;  . ESOPHAGOGASTRODUODENOSCOPY N/A 01/02/2013   Procedure: ESOPHAGOGASTRODUODENOSCOPY (EGD);  Surgeon: Gatha Mayer, MD;  Location: [facility];  Service: Endoscopy;  Laterality: N/A;  . ESOPHAGOGASTRODUODENOSCOPY N/A 04/23/2013   Procedure: ESOPHAGOGASTRODUODENOSCOPY (EGD);  Surgeon: Gatha Mayer, MD;  Location: [facility];  Service: Endoscopy;  Laterality: N/A;  . ESOPHAGOGASTRODUODENOSCOPY N/A 05/01/2013   Procedure: ESOPHAGOGASTRODUODENOSCOPY (EGD);  Surgeon: Gatha Mayer, MD;  Location: [facility];  Service: Endoscopy;  Laterality: N/A;  follow-up varices and possibly band them  . ESOPHAGOGASTRODUODENOSCOPY N/A 09/04/2013   Procedure: ESOPHAGOGASTRODUODENOSCOPY (EGD);  Surgeon: Gatha Mayer, MD;  Location: [facility];  Service: Endoscopy;  Laterality: N/A;  . ESOPHAGOGASTRODUODENOSCOPY N/A 09/10/2014   Procedure: ESOPHAGOGASTRODUODENOSCOPY (EGD);  Surgeon: Gatha Mayer, MD;  Location: [facility];  Service: Endoscopy;  Laterality: N/A;  . ESOPHAGOGASTRODUODENOSCOPY (EGD) WITH PROPOFOL N/A 08/21/2015   Procedure: ESOPHAGOGASTRODUODENOSCOPY (EGD) WITH PROPOFOL;  Surgeon: Gatha Mayer, MD;  Location: [facility];  Service: Endoscopy;  Laterality: N/A;  . ESOPHAGOGASTRODUODENOSCOPY (EGD) WITH PROPOFOL N/A 04/06/2017   Procedure: ESOPHAGOGASTRODUODENOSCOPY (EGD) WITH PROPOFOL;  Surgeon: Gatha Mayer, MD;  Location: [facility];  Service: Endoscopy;  Laterality: N/A;  . ESOPHAGOGASTRODUODENOSCOPY W/ BANDING  06/26/2010   variceal ligation  . GASTRIC VARICES BANDING N/A 01/02/2013   Procedure: GASTRIC VARICES BANDING;  Surgeon: Gatha Mayer, MD;  Location: [facility];  Service: Endoscopy;  Laterality: N/A;  possible banding  . UPPER GASTROINTESTINAL ENDOSCOPY        Social History:     Social History   Tobacco Use  . Smoking status: Former Smoker    Packs/day: 0.10    Years: 10.00    Pack years: 1.00    Types: Cigarettes    Start date: 03/03/2014    Last attempt to quit: 04/03/2014    Years since quitting: 3.5  . Smokeless tobacco: Never Used  . Tobacco comment: Chews  nicorette gum.  Substance Use Topics  . Alcohol use: Yes    Comment: pt is an alcoholic currently in [place] meetings     Lives - at home  Mobility - walks by self   Family History :     Family History  Problem Relation Age of Onset  . Hyperlipidemia Mother   . Hypertension Mother   . Breast cancer Mother        questionable  . Heart disease Mother   . Hypertension Father   . Irritable bowel syndrome Father   . Drug abuse Brother   . Heart disease Brother   . Breast cancer Maternal Aunt  maternal great aunt  . Alcohol abuse Other   . Heart disease Maternal Uncle   . Irritable bowel syndrome Paternal Aunt   . Colon cancer Neg Hx        Home Medications:   Prior to Admission medications   Medication Sig Start Date End Date Taking? Authorizing Provider  ALPRAZolam Duanne Moron) 1 MG tablet Take 1 tablet (1 mg total) by mouth 2 (two) times daily. 09/20/17  Yes Gatha Mayer, MD  AMBULATORY NON [facility] MEDICATION Lift Chair- Use as needed Dx: generalized weakness, hepatic encephalopathy 01/23/15  Yes Zehr, Laban Emperor, PA-C  aMILoride (MIDAMOR) 5 MG tablet TAKE 3 TABLETS(15 MG) BY MOUTH DAILY 09/01/17  Yes Gatha Mayer, MD  amitriptyline (ELAVIL) 100 MG tablet TAKE 1 TABLET(100 MG) BY MOUTH AT BEDTIME 08/30/17  Yes Gatha Mayer, MD  CONSTULOSE 10 GM/15ML solution TAKE 60 ML BY MOUTH 4 TIMES DAILY 02/21/17  Yes Gatha Mayer, MD  dolutegravir (TIVICAY) 50 MG tablet Take 1 tablet (50 mg total) by mouth daily. 08/22/17  Yes Tommy Medal, Lavell Islam, MD  emtricitabine-tenofovir AF (DESCOVY) 200-25 MG tablet Take 1 tablet by mouth daily. 08/22/17  Yes Tommy Medal, Lavell Islam, MD  esomeprazole (NEXIUM) 40 MG capsule Take 1 capsule (40 mg total) by mouth daily before breakfast. 01/03/15  Yes Gatha Mayer, MD  furosemide (LASIX) 40 MG tablet TAKE 2 TABLETS(80 MG) BY MOUTH TWICE DAILY 09/29/17  Yes Gatha Mayer, MD  NEXIUM 40 MG capsule TAKE 1 CAPSULE BY MOUTH EVERY DAY BEFORE  BREAKFAST 07/21/17  Yes Gatha Mayer, MD  potassium chloride SA (K-DUR,KLOR-CON) 20 MEQ tablet Take 2 tablets (40 mEq total) by mouth 2 (two) times daily. 08/23/17  Yes Tommy Medal, Lavell Islam, MD  senna (SENOKOT) 8.6 MG TABS tablet TAKE 2 TABLETS(17.2 MG) BY MOUTH DAILY. START WITH 1 TABLET Patient taking differently: 1 t po qd 08/08/15  Yes Gatha Mayer, MD  Simethicone (GAS-X PO) Take 1-2 tablets by mouth 3 (three) times daily as needed (for flatulence).    Yes [provider]  sodium phosphate (FLEET) enema Place 1 enema rectally at bedtime as needed (for constipation.).    Yes [provider]  triamcinolone cream (KENALOG) 0.1 % APPLY TO AFFECTED AREA DAILY AS NEEDED FOR ITCHING OR RASH 12/18/14  Yes [provider]  XIFAXAN 550 MG TABS tablet TAKE ONE TABLET BY MOUTH TWICE DAILY 07/08/17  Yes Gatha Mayer, MD  sildenafil (VIAGRA) 25 MG tablet Take 1 tablet (25 mg total) by mouth daily as needed for erectile dysfunction. Patient not taking: Reported on 10/02/2017 11/03/15   Tommy Medal, Lavell Islam, MD     Allergies:     Allergies  Allergen Reactions  . Penicillins     REACTION: hives Has patient had a PCN reaction causing immediate rash, facial/tongue/throat swelling, SOB or lightheadedness with hypotension: Yes Has patient had a PCN reaction causing severe rash involving mucus membranes or skin necrosis: Yes Has patient had a PCN reaction that required hospitalization No Has patient had a PCN reaction occurring within the last 10 years: Yes If all of the above answers are "NO", then may proceed with Cephalosporin use./Per pt makes him feel weird!   . Sulfa Antibiotics     Pt states he did not have a reaction last time to Sulfa!     Physical Exam:   Vitals  Blood pressure (!) 129/104, pulse 74, temperature (!) 97.4 F (36.3 C), temperature source  Oral, resp. rate 16, SpO2 98 %.   1. General  lying in bed in NAD,   2. Normal affect and insight, Not  Suicidal or Homicidal, Awake Alert, Oriented X 3.  3. No F.N deficits, ALL C.Nerves Intact, Strength 5/5 all 4 extremities, Sensation intact all 4 extremities, Plantars down going.  4. Ears and Eyes appear Normal, Conjunctivae clear, PERRLA. Moist Oral Mucosa.  5. Supple Neck, No JVD, No cervical lymphadenopathy appriciated, No Carotid Bruits.  6. Symmetrical Chest wall movement, Good air movement bilaterally, CTAB.  7. RRR, No Gallops, Rubs or Murmurs, No Parasternal Heave.  8. Positive Bowel Sounds, Abdomen Soft, No tenderness, No organomegaly appriciated,No rebound -guarding or rigidity.  9.  No Cyanosis, Normal Skin Turgor, No Skin Rash or Bruise.  10. Good muscle tone,  joints appear normal , no effusions, Normal ROM.  11. No Palpable Lymph Nodes in Neck or Axillae     Data Review:    CBC Recent Labs  Lab 10/02/17 1912  WBC 2.4*  HGB 11.3*  HCT 35.1*  PLT 66*  MCV 84.0  MCH 27.0  MCHC 32.2  RDW 16.4*  LYMPHSABS 0.6*  MONOABS 0.3  EOSABS 0.1  BASOSABS 0.0   ------------------------------------------------------------------------------------------------------------------  Chemistries  Recent Labs  Lab 10/02/17 1912  NA 142  K 3.9  CL 105  CO2 25  GLUCOSE 107*  BUN 15  CREATININE 1.17  CALCIUM 9.6  AST 28  ALT 20  ALKPHOS 112  BILITOT 1.2   ------------------------------------------------------------------------------------------------------------------ CrCl cannot be calculated (Unknown ideal weight.). ------------------------------------------------------------------------------------------------------------------ No results for input(s): TSH, T4TOTAL, T3FREE, THYROIDAB in the last 72 hours.  Invalid input(s): FREET3  Coagulation profile No results for input(s): INR, PROTIME in the last 168 hours. ------------------------------------------------------------------------------------------------------------------- No results for input(s): DDIMER  in the last 72 hours. -------------------------------------------------------------------------------------------------------------------  Cardiac Enzymes No results for input(s): CKMB, TROPONINI, MYOGLOBIN in the last 168 hours.  Invalid input(s): CK ------------------------------------------------------------------------------------------------------------------ No results found for: BNP   ---------------------------------------------------------------------------------------------------------------  Urinalysis    Component Value Date/Time   COLORURINE YELLOW 04/15/2015 [place] 04/15/2015 1712   LABSPEC 1.010 04/15/2015 1712   PHURINE 7.5 04/15/2015 1712   GLUCOSEU NEGATIVE 04/15/2015 1712   HGBUR NEGATIVE 04/15/2015 1712   BILIRUBINUR NEGATIVE 04/15/2015 1712   KETONESUR NEGATIVE 04/15/2015 1712   PROTEINUR NEGATIVE 12/02/2014 2140   UROBILINOGEN 1.0 04/15/2015 1712   NITRITE NEGATIVE 04/15/2015 1712   LEUKOCYTESUR NEGATIVE 04/15/2015 1712    ----------------------------------------------------------------------------------------------------------------   Imaging Results:    Ct Head Wo Contrast  Result Date: 10/02/2017 CLINICAL DATA:  Altered level of consciousness EXAM: CT HEAD WITHOUT CONTRAST TECHNIQUE: Contiguous axial images were obtained from the base of the skull through the vertex without intravenous contrast. COMPARISON:  04/12/2014 FINDINGS: Brain: No acute intracranial abnormality. Specifically, no hemorrhage, hydrocephalus, mass lesion, acute infarction, or significant intracranial injury. Vascular: No hyperdense vessel or unexpected calcification. Skull: No acute calvarial abnormality. Sinuses/Orbits: Visualized paranasal sinuses and mastoids clear. Orbital soft tissues unremarkable. Other: None IMPRESSION: No acute intracranial abnormality. Electronically Signed   By: Rolm Baptise M.D.   On: 10/02/2017 19:29       Assessment & Plan:     Active Problems:   Hepatic encephalopathy (HCC)    AMS secondary to hepatic encephalopathy Lactulose 63mL po qid Cont Rifaxamin Check Ammonia in am  Etoh abuse Pt counselled on cessation  Gerd Cont PPI  Cirrhosis (Eto[place]) Cont Lasix 80mg  po bid Cont potassium  Hiv Cont Tivicay Cont Descovy  Overdose  on Xanax ? Monitor 1:1 direct obs for safety  ? Suicidal ideation   DVT Prophylaxis - SCDs   AM Labs Ordered, also please review Full Orders  Family Communication: Admission, patients condition and plan of care including tests being ordered have been discussed with the patient  who indicate understanding and agree with the plan and Code Status.  Code Status FULL CODE  Likely DC to  home  Condition GUARDED   Consults called: none  Admission status: inpatient   Time spent in minutes : 45   Jani Gravel M.D on 10/02/2017 at 9:56 PM  Between 7am to 7pm - Pager - [phone] After 7pm go to [email or web address] - password [facility]  [facility] - Office  [phone]

## 2017-10-02 NOTE — ED Notes (Signed)
Patient's brother requested to be called in the morning with an update/ if patient is discharged.   Kyng Matlock [phone]

## 2017-10-02 NOTE — ED Notes (Signed)
Bed: WA20 Expected date: 10/02/17 Expected time: 5:13 PM Means of arrival: Ambulance Comments: Weakness, cirrhosis

## 2017-10-02 NOTE — ED Provider Notes (Signed)
[facility] DEPT Provider Note   CSN: [id number] Arrival date & time: 10/02/17  1720     History   Chief Complaint Chief Complaint  Patient presents with  . Weakness    HPI [name] is a 52 y.o. male.  HPI Pt has been depressed after his mother died on Sep 25, 2022.   Pt takes xanax.  He was prescribed 60 tabs on April 9.  Family noticed that all of the pills are gone today.  This past week pt has been drinking alcohol excessively.  He was sleeping a lot the last day or so.  He told family that he took 64 of his xanax but he seemed more alert and they were able to watch a movie.  Since yesterday though he has been falling whenever he tries to walk.  He is  Very sleepy.  His speech is hard to understand.  Pt has said to family that he does not want to live anymore.  Pt denies taking anything other than xanax.  He doesn't specifically say he tried to kill himself.  He says that he felt really bad. Past Medical History:  Diagnosis Date  . Alcoholic cirrhosis of liver ([place])   . Alcoholism, chronic ([place])   . ANXIETY 06/23/2006  . Ascites 11/13/2009  . Ascites   . Avulsion fracture of middle phalanges of  3rd/4th fingers left hand 01/07/2014  . Blood dyscrasia    HIV  . Cellulitis of right lower extremity 03/02/2016  . DEPRESSION 06/23/2006  . 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 Erectile dysfunction 07/14/2015  . 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 Gastric ulcer 06/2010  . GERD (gastroesophageal reflux disease)   . HIV DISEASE 06/23/2006  . HYPERLIPIDEMIA 06/23/2006  . HYPERTENSION 06/23/2006   denies  . 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 Iron deficiency anemia   . Left foot infection   . Obesity (BMI 30-39.9)    BMI 34 kg/m^2  . Portal hypertensive gastropathy ([place]) 01/02/2013  . Recent shoulder injury    left shoulder/fell in parking lot/ no surgery/December 26, 2016  . SBP (spontaneous bacterial peritonitis)  ([place]) 05/03/2011   Suspected by high leukocytes on paracentesis. Clinical scenario also compatible. November 2012 responded to Levaquin. Started on trimethoprim-sulfamethoxazole double strength daily for prophylaxis.   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 Skin cancer    left calf  . STRAIN, CHEST WALL 03/15/2007  . Varices, esophageal ([place]) 06/2010  . Venous stasis 03/02/2016    Patient Active Problem List   Diagnosis Date Noted  . Secondary esophageal varices with bleeding ([place])   . Venous stasis 03/02/2016  . Secondary esophageal varices without bleeding ([place])   . Erectile dysfunction 07/14/2015  . Encephalopathy, hepatic ([place]) 01/23/2015  . Peripheral edema 12/31/2014  . Constipation 07/15/2014  . HIV (human immunodeficiency virus infection) ([place]) 02/14/2014  . Fall 01/07/2014  . Orthostatic hypotension 05/28/2013  . Personal history of failed moderate sedation- MUST HAVE MAC OR GENERAL 05/01/2013  . Portal hypertensive gastropathy ([place]) 01/02/2013  . Acquired pancytopenia ([place]) 05/19/2011  . Insomnia 12/21/2010  . Alcoholic cirrhosis of liver ([place]) 07/15/2010  . Idiopathic peripheral autonomic neuropathy 05/25/2007  . Idiopathic peripheral autonomic neuropathy, unspecified 05/25/2007  . 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 Human immunodeficiency virus (HIV) disease ([place]) 06/23/2006  . HYPERLIPIDEMIA 06/23/2006  . Anxiety state 06/23/2006  . Depression 06/23/2006  . HYPERTENSION 06/23/2006  . Reflux esophagitis 06/23/2006    Past Surgical History:  Procedure Laterality Date  . CARPAL TUNNEL RELEASE     left  . COLONOSCOPY  march 2013  . ESOPHAGEAL BANDING  05/01/2013   Procedure: ESOPHAGEAL BANDING;  Surgeon: Gatha Mayer, MD;  Location: [facility];  Service: Endoscopy;;  . ESOPHAGOGASTRODUODENOSCOPY  07/01/2010;  08/12/10   small varices, gastric ulcer  . ESOPHAGOGASTRODUODENOSCOPY  10/26/2011   Procedure: ESOPHAGOGASTRODUODENOSCOPY (EGD);  Surgeon: Gatha Mayer, MD;  Location: [facility];  Service: Endoscopy;  Laterality: N/A;  . ESOPHAGOGASTRODUODENOSCOPY N/A 01/02/2013   Procedure: ESOPHAGOGASTRODUODENOSCOPY (EGD);  Surgeon: Gatha Mayer, MD;  Location: [facility];  Service: Endoscopy;  Laterality: N/A;  . ESOPHAGOGASTRODUODENOSCOPY N/A 04/23/2013   Procedure: ESOPHAGOGASTRODUODENOSCOPY (EGD);  Surgeon: Gatha Mayer, MD;  Location: [facility];  Service: Endoscopy;  Laterality: N/A;  . ESOPHAGOGASTRODUODENOSCOPY N/A 05/01/2013   Procedure: ESOPHAGOGASTRODUODENOSCOPY (EGD);  Surgeon: Gatha Mayer, MD;  Location: [facility];  Service: Endoscopy;  Laterality: N/A;  follow-up varices and possibly band them  . ESOPHAGOGASTRODUODENOSCOPY N/A 09/04/2013   Procedure: ESOPHAGOGASTRODUODENOSCOPY (EGD);  Surgeon: Gatha Mayer, MD;  Location: [facility];  Service: Endoscopy;  Laterality: N/A;  . ESOPHAGOGASTRODUODENOSCOPY N/A 09/10/2014   Procedure: ESOPHAGOGASTRODUODENOSCOPY (EGD);  Surgeon: Gatha Mayer, MD;  Location: [facility];  Service: Endoscopy;  Laterality: N/A;  . ESOPHAGOGASTRODUODENOSCOPY (EGD) WITH PROPOFOL N/A 08/21/2015   Procedure: ESOPHAGOGASTRODUODENOSCOPY (EGD) WITH PROPOFOL;  Surgeon: Gatha Mayer, MD;  Location: [facility];  Service: Endoscopy;  Laterality: N/A;  . ESOPHAGOGASTRODUODENOSCOPY (EGD) WITH PROPOFOL N/A 04/06/2017   Procedure: ESOPHAGOGASTRODUODENOSCOPY (EGD) WITH PROPOFOL;  Surgeon: Gatha Mayer, MD;  Location: [facility];  Service: Endoscopy;  Laterality: N/A;  . ESOPHAGOGASTRODUODENOSCOPY W/ BANDING  06/26/2010   variceal ligation  . GASTRIC VARICES BANDING N/A 01/02/2013   Procedure: GASTRIC VARICES BANDING;  Surgeon: Gatha Mayer, MD;  Location: [facility];  Service: Endoscopy;  Laterality: N/A;  possible banding  . UPPER GASTROINTESTINAL ENDOSCOPY          Home Medications    Prior to Admission medications   Medication Sig Start Date End Date Taking? Authorizing Provider    ALPRAZolam Duanne Moron) 1 MG tablet Take 1 tablet (1 mg total) by mouth 2 (two) times daily. 09/20/17  Yes Gatha Mayer, MD  AMBULATORY NON [facility] MEDICATION Lift Chair- Use as needed Dx: generalized weakness, hepatic encephalopathy 01/23/15  Yes Zehr, Laban Emperor, PA-C  aMILoride (MIDAMOR) 5 MG tablet TAKE 3 TABLETS(15 MG) BY MOUTH DAILY 09/01/17  Yes Gatha Mayer, MD  amitriptyline (ELAVIL) 100 MG tablet TAKE 1 TABLET(100 MG) BY MOUTH AT BEDTIME 08/30/17  Yes Gatha Mayer, MD  CONSTULOSE 10 GM/15ML solution TAKE 60 ML BY MOUTH 4 TIMES DAILY 02/21/17  Yes Gatha Mayer, MD  dolutegravir (TIVICAY) 50 MG tablet Take 1 tablet (50 mg total) by mouth daily. 08/22/17  Yes Tommy Medal, Lavell Islam, MD  emtricitabine-tenofovir AF (DESCOVY) 200-25 MG tablet Take 1 tablet by mouth daily. 08/22/17  Yes Tommy Medal, Lavell Islam, MD  esomeprazole (NEXIUM) 40 MG capsule Take 1 capsule (40 mg total) by mouth daily before breakfast. 01/03/15  Yes Gatha Mayer, MD  furosemide (LASIX) 40 MG tablet TAKE 2 TABLETS(80 MG) BY MOUTH TWICE DAILY 09/29/17  Yes Gatha Mayer, MD  NEXIUM 40 MG capsule TAKE 1 CAPSULE BY MOUTH EVERY DAY BEFORE BREAKFAST 07/21/17  Yes Gatha Mayer, MD  potassium chloride SA (K-DUR,KLOR-CON) 20 MEQ tablet Take 2 tablets (40 mEq total) by mouth 2 (two) times daily. 08/23/17  Yes Tommy Medal, Lavell Islam, MD  senna (SENOKOT) 8.6 MG TABS tablet TAKE 2 TABLETS(17.2 MG) BY MOUTH DAILY. START WITH 1 TABLET Patient taking differently: 1 t po qd 08/08/15  Yes Gatha Mayer, MD  Simethicone (GAS-X PO) Take 1-2 tablets by mouth 3 (three) times daily as needed (for flatulence).    Yes [provider]  sodium phosphate (FLEET) enema Place 1 enema rectally at bedtime as needed (for constipation.).    Yes [provider]  triamcinolone cream (KENALOG) 0.1 % APPLY TO AFFECTED AREA DAILY AS NEEDED FOR ITCHING OR RASH 12/18/14  Yes [provider]  XIFAXAN 550 MG TABS tablet TAKE ONE TABLET BY  MOUTH TWICE DAILY 07/08/17  Yes Gatha Mayer, MD  sildenafil (VIAGRA) 25 MG tablet Take 1 tablet (25 mg total) by mouth daily as needed for erectile dysfunction. Patient not taking: Reported on 10/02/2017 11/03/15   Tommy Medal, Lavell Islam, MD    Family History Family History  Problem Relation Age of Onset  . Hyperlipidemia Mother   . Hypertension Mother   . Breast cancer Mother        questionable  . Heart disease Mother   . Hypertension Father   . Irritable bowel syndrome Father   . Drug abuse Brother   . Heart disease Brother   . Breast cancer Maternal Aunt        maternal great aunt  . Alcohol abuse Other   . Heart disease Maternal Uncle   . Irritable bowel syndrome Paternal Aunt   . Colon cancer Neg Hx     Social History Social History   Tobacco Use  . Smoking status: Former Smoker    Packs/day: 0.10    Years: 10.00    Pack years: 1.00    Types: Cigarettes    Start date: 03/03/2014    Last attempt to quit: 04/03/2014    Years since quitting: 3.5  . Smokeless tobacco: Never Used  . Tobacco comment: Chews nicorette gum.  Substance Use Topics  . Alcohol use: Yes    Comment: pt is an alcoholic currently in [employer]  . Drug use: No     Allergies   Penicillins and Sulfa antibiotics   Review of Systems Review of Systems  All other systems reviewed and are negative.    Physical Exam Updated Vital Signs BP (!) 129/104   Pulse 74   Temp (!) 97.4 F (36.3 C) (Oral)   Resp 16   SpO2 98%   Physical Exam  Constitutional: He appears well-developed and well-nourished. He appears lethargic. No distress.  Morbidly obese   HENT:  Head: Normocephalic and atraumatic.  Right Ear: External ear normal.  Left Ear: External ear normal.  Eyes: Conjunctivae are normal. Right eye exhibits no discharge. Left eye exhibits no discharge. No scleral icterus.  Neck: Neck supple. No tracheal deviation present.  Cardiovascular: Normal rate, regular rhythm and  intact distal pulses.  Pulmonary/Chest: Effort normal and breath sounds normal. No stridor. No respiratory distress. He has no wheezes. He has no rales.  Abdominal: Soft. Bowel sounds are normal. He exhibits no distension. There is no tenderness. There is no rebound and no guarding.  Musculoskeletal: He exhibits no edema or tenderness.  Neurological: He has normal strength. He appears lethargic. No cranial nerve deficit (no facial droop, extraocular movements intact, no slurred speech) or sensory deficit. He exhibits normal muscle tone. He displays no seizure activity. Coordination normal. GCS eye subscore is 4. GCS verbal  subscore is 4. GCS motor subscore is 6.  Skin: Skin is warm and dry. No rash noted.  Psychiatric: He has a normal mood and affect.  Nursing note and vitals reviewed.    ED Treatments / Results  Labs (all labs ordered are listed, but only abnormal results are displayed) Labs Reviewed  COMPREHENSIVE METABOLIC PANEL - Abnormal; Notable for the following components:      Result Value   Glucose, Bld 107 (*)    All other components within normal limits  CBC WITH DIFFERENTIAL/PLATELET - Abnormal; Notable for the following components:   WBC 2.4 (*)    RBC 4.18 (*)    Hemoglobin 11.3 (*)    HCT 35.1 (*)    RDW 16.4 (*)    Platelets 66 (*)    Neutro Abs 1.4 (*)    Lymphs Abs 0.6 (*)    All other components within normal limits  ACETAMINOPHEN LEVEL - Abnormal; Notable for the following components:   Acetaminophen (Tylenol), Serum <10 (*)    All other components within normal limits  AMMONIA - Abnormal; Notable for the following components:   Ammonia 94 (*)    All other components within normal limits  ETHANOL  SALICYLATE LEVEL  RAPID URINE DRUG SCREEN, HOSP PERFORMED    EKG EKG Interpretation  Date/Time:  Sunday October 02 2017 19:25:06 EDT Ventricular Rate:  64 PR Interval:    QRS Duration: 107 QT Interval:  454 QTC Calculation: 469 R Axis:   37 Text  Interpretation:  Sinus rhythm Borderline prolonged PR interval Low voltage, precordial leads pr prolongation is new since last tracing Confirmed by Dorie Rank [phone]) on 10/02/2017 8:20:37 PM   Radiology Ct Head Wo Contrast  Result Date: 10/02/2017 CLINICAL DATA:  Altered level of consciousness EXAM: CT HEAD WITHOUT CONTRAST TECHNIQUE: Contiguous axial images were obtained from the base of the skull through the vertex without intravenous contrast. COMPARISON:  04/12/2014 FINDINGS: Brain: No acute intracranial abnormality. Specifically, no hemorrhage, hydrocephalus, mass lesion, acute infarction, or significant intracranial injury. Vascular: No hyperdense vessel or unexpected calcification. Skull: No acute calvarial abnormality. Sinuses/Orbits: Visualized paranasal sinuses and mastoids clear. Orbital soft tissues unremarkable. Other: None IMPRESSION: No acute intracranial abnormality. Electronically Signed   By: Rolm Baptise M.D.   On: 10/02/2017 19:29    Procedures Procedures (including critical care time)  Medications Ordered in ED Medications  lactulose (CHRONULAC) 10 GM/15ML solution 30 g (has no administration in time range)     Initial Impression / Assessment and Plan / ED Course  I have reviewed the triage vital signs and the nursing notes.  Pertinent labs & imaging results that were available during my care of the patient were reviewed by me and considered in my medical decision making (see chart for details).  Clinical Course as of Oct 03 2146  Sun Oct 02, 2017  2145 Patient's laboratory tests are consistent with his chronic liver disease.  Patient's ammonia is elevated however and this may be contributing to his mental status changes in addition to his overuse of benzodiazepines.  I will order a dose of lactulose and admitted to the hospital for further treatment.   [JK]    Clinical Course User Index [JK] Dorie Rank, MD   Patient presented to the emergency room for evaluation  of somnolence in the setting of excessive alcohol use and benzodiazepine use.  Patient has been depressed and suicidal.  Patient does have history of liver disease.  His ammonia level was elevated today  at 54.  I suspect this is contributing to his somnolence and difficulties with his coordination.  I have ordered lactulose.  I will consult the medical service for admission.  Patient will need psychiatric evaluation once his medical illnesses have cleared  Final Clinical Impressions(s) / ED Diagnoses   Final diagnoses:  Hepatic encephalopathy ([place])  Suicidal ideation     Dorie Rank, MD 10/02/17 2148

## 2017-10-03 ENCOUNTER — Other Ambulatory Visit: Payer: Self-pay

## 2017-10-03 DIAGNOSIS — F1020 Alcohol dependence, uncomplicated: Secondary | ICD-10-CM

## 2017-10-03 DIAGNOSIS — E66813 Obesity, class 3: Secondary | ICD-10-CM

## 2017-10-03 HISTORY — DX: Obesity, class 3: E66.813

## 2017-10-03 HISTORY — DX: Morbid (severe) obesity due to excess calories: E66.01

## 2017-10-03 HISTORY — DX: Alcohol dependence, uncomplicated: F10.20

## 2017-10-03 LAB — COMPREHENSIVE METABOLIC PANEL
ALBUMIN: 3.4 g/dL — AB (ref 3.5–5.0)
ALK PHOS: 101 U/L (ref 38–126)
ALT: 19 U/L (ref 17–63)
AST: 29 U/L (ref 15–41)
Anion gap: 13 (ref 5–15)
BILIRUBIN TOTAL: 1.8 mg/dL — AB (ref 0.3–1.2)
BUN: 13 mg/dL (ref 6–20)
CO2: 23 mmol/L (ref 22–32)
CREATININE: 1.03 mg/dL (ref 0.61–1.24)
Calcium: 9.2 mg/dL (ref 8.9–10.3)
Chloride: 109 mmol/L (ref 101–111)
GFR calc Af Amer: >60 mL/min (ref >60)
GLUCOSE: 94 mg/dL (ref 65–99)
POTASSIUM: 3.8 mmol/L (ref 3.5–5.1)
Sodium: 145 mmol/L (ref 135–145)
TOTAL PROTEIN: 6.2 g/dL — AB (ref 6.5–8.1)

## 2017-10-03 LAB — CBC
HCT: 35.3 % — ABNORMAL LOW (ref 39.0–52.0)
Hemoglobin: 11.4 g/dL — ABNORMAL LOW (ref 13.0–17.0)
MCH: 27.3 pg (ref 26.0–34.0)
MCHC: 32.3 g/dL (ref 30.0–36.0)
MCV: 84.7 fL (ref 78.0–100.0)
PLATELETS: 66 10*3/uL — AB (ref 150–400)
RBC: 4.17 MIL/uL — ABNORMAL LOW (ref 4.22–5.81)
RDW: 16.7 % — ABNORMAL HIGH (ref 11.5–15.5)
WBC: 2.3 10*3/uL — ABNORMAL LOW (ref 4.0–10.5)

## 2017-10-03 LAB — AMMONIA: Ammonia: 68 umol/L — ABNORMAL HIGH (ref 9–35)

## 2017-10-03 NOTE — Consult Note (Signed)
Patient is extremely sedated and unable to stay awake to participate in interview. Will attempt to interview patient again tomorrow. Please continue suicide precautions.   Buford Dresser, DO 10/03/17 12:57 PM

## 2017-10-03 NOTE — Progress Notes (Signed)
Patient arrived on the unit at approximately 2314. He is alert but drowsy and has slurred speech. 1:1 sitter in place. Denies trying to harm himself or has a plan to do same. Has some memory deficits and is impulsive at times. Will continue to monitor his mental status.

## 2017-10-03 NOTE — Progress Notes (Signed)
PROGRESS NOTE  [name] NIO:[id number] DOB: 1965-10-31 DOA: 10/02/2017 PCP: Greig Right, MD  Brief Narrative: 52 year old man PMH alcoholic cirrhosis, alcoholism, HIV, presented with history of confusion, reported history of Xanax misuse, excessive alcohol use, suicidal ideation.  He was admitted for hepatic encephalopathy, benzodiazepine misuse, suicidal ideation.  Assessment/Plan Acute hepatic encephalopathy secondary to alcoholic cirrhosis with associated complications of esophageal varices, thrombocytopenia, history of SBP --Serum ammonia trending down.  Continue lactulose and rifaximin as able.  Check ammonia level in the morning.  Suicidal ideation --Await psychiatric evaluation when the patient is able to participate.  Continue Air cabin crew.  Alcoholism --No evidence of withdrawal.  Xanax overuse.  Prescribed 60 tablets April 9.  Per family all pills gone by 4/21. --Expect this is the cause of his hypersomnolence.  However he is protecting his airway and following simple commands.  Continue to monitor closely.  Continue telemetry.  Grief reaction secondary to death of mother 19-Sep-2022. --Await psychiatry recommendations  HIV.  Last RNA quant was undetectable March 2019.  CD4 280 March 2019 --Continue dolutegravir, emtricitabine-tenofovir.  Acquired pancytopenia.  Likely secondary to cirrhosis and HIV. --Stable.  No further evaluation suggested.  Morbid obesity. Body mass index is 45.63 kg/m. --Recommend weight loss as an outpatient under supervision of physician   Continue to monitor closely  DVT prophylaxis: SCDs Code Status: full Family Communication: none Disposition Plan: pending    Murray Hodgkins, MD  [facility] Direct contact: [phone] --Via [place]  --[email or web address]; password [email or web address]  7PM-7AM contact night coverage as above 10/03/2017, 12:52 PM  LOS: 1 day   Consultants:    Procedures:    Antimicrobials:    Interval  history/Subjective: Feels okay.  Per nursing has been asleep most of the day and not able to take oral medications.  Objective: Vitals:  Vitals:   10/02/17 2347 10/03/17 0620  BP: 127/78 131/73  Pulse: 70 85  Resp: 16 20  Temp: 98.7 F (37.1 C) 97.6 F (36.4 C)  SpO2: 97% 98%    Exam:  Constitutional:  . Appears calm and comfortable.  Awakens to voice and follows simple commands. Eyes:  Marland Kitchen Normal lids  ENMT:  . grossly normal hearing  . Lips appear normal Respiratory:  . CTA bilaterally, no w/r/r.  . Respiratory effort normal Cardiovascular:  . RRR, no m/r/g Abdomen:  . Obese, soft, nontender Musculoskeletal:  . Moves arms and legs to command. Psychiatric:  . Mental status o Unable to assess mood or affect secondary to somnolence . Cannot assess orientation or judgment and insight.   I have personally reviewed the following:   Labs:  Basic metabolic panel unremarkable  Total bilirubin 1.8, transaminases and alkaline phosphatase within normal limits  Serum ammonia trending down, 94, 68  Platelets stable, 66.  WBC stable, 2.3.  Hemoglobin stable 11.4.  Imaging studies:  CT head no acute disease  Medical tests:  EKG sinus rhythm  Scheduled Meds: . aMILoride  15 mg Oral Daily  . dolutegravir  50 mg Oral Daily  . emtricitabine-tenofovir AF  1 tablet Oral Daily  . furosemide  40 mg Intravenous Daily  . lactulose  60 g Oral QID  . pantoprazole  40 mg Oral Daily  . potassium chloride SA  40 mEq Oral BID  . rifaximin  550 mg Oral BID  . sodium chloride flush  3 mL Intravenous Q12H   Continuous Infusions: . sodium chloride      Principal Problem:   Hepatic encephalopathy ([place])  Active Problems:   Human immunodeficiency virus (HIV) disease ([place])   Alcoholic cirrhosis of liver ([facility])   Acquired pancytopenia ([facility])   Suicidal ideation   Alcoholism ([place])   Obesity, Class III, BMI 40-49.9 (morbid obesity) ([place])   LOS: 1 day

## 2017-10-03 NOTE — Consult Note (Addendum)
[facility] Face-to-Face Psychiatry Consult   Reason for Consult:  Depression with SI. Referring Physician:  Dr. Sarajane Jews Patient Identification: CAIRO AGOSTINELLI MRN:  [id number] Principal Diagnosis: MDD (major depressive disorder), recurrent severe, without psychosis ([place]) Diagnosis:   Patient Active Problem List   Diagnosis Date Noted  . Hepatic encephalopathy ([place]) [K72.90] 10/02/2017  . Suicidal ideation [R45.851]   . Secondary esophageal varices with bleeding (HCC) [I85.11]   . Venous stasis [I87.8] 03/02/2016  . Secondary esophageal varices without bleeding (HCC) [I85.10]   . Erectile dysfunction [N52.9] 07/14/2015  . Encephalopathy, hepatic ([place]) [K72.90] 01/23/2015  . Peripheral edema [R60.9] 12/31/2014  . Constipation [K59.00] 07/15/2014  . HIV (human immunodeficiency virus infection) ([place]) [B20] 02/14/2014  . Fall [W19.XXXA] 01/07/2014  . Orthostatic hypotension [I95.1] 05/28/2013  . Personal history of failed moderate sedation- MUST HAVE MAC OR GENERAL [Z92.83] 05/01/2013  . Portal hypertensive gastropathy ([place]) [K76.6, K31.89] 01/02/2013  . Acquired pancytopenia ([place]) [Q94.765] 05/19/2011  . Insomnia [G47.00] 12/21/2010  . Alcoholic cirrhosis of liver ([place]) [K70.30] 07/15/2010  . Idiopathic peripheral autonomic neuropathy [G90.09] 05/25/2007  . Idiopathic peripheral autonomic neuropathy, unspecified [G90.09] 05/25/2007  . SINUSITIS, CHRONIC MAXILLARY [J32.0] 03/06/2007  . Human immunodeficiency virus (HIV) disease ([place]) [B20] 06/23/2006  . HYPERLIPIDEMIA [E78.5] 06/23/2006  . Anxiety state [F41.1] 06/23/2006  . Depression [F32.9] 06/23/2006  . HYPERTENSION [I10] 06/23/2006  . Reflux esophagitis [K21.0] 06/23/2006    Total Time spent with patient: 1 hour  Subjective:   [name] is a 52 y.o. male patient admitted with AMS.  HPI:   Per chart review, patient was admitted with AMS secondary to hepatic encephalopathy. He has been depressed since his mother died on  Oct 08, 2022. He was prescribed Xanax 1 mg tablets (#60) on 4/9. He told his family that he took 30 tablets of his Xanax. He reports that he does not want to live anymore. He has been drinking excessive alcohol.   On interview, Mr. Verdejo reports grieving the loss of his mother.  He reports that she died from UTI complications.  He is very close to his living family.  He denies a history of suicide attempts.  He reports of onset SI 3 days ago.  He reports that he does "not want to go on without my mother" so he overdosed on Xanax to end his life prior to admission.  He does not remember how he got to the hospital.   When asked about current SI, he reports "I do not know how I feel today."  He denies HI or AVH.  He reports a history of depression and has seen a therapist in the past.  He has been prescribed Xanax for many years for anxiety.  He denies problems with sleep or appetite.  Denies any history of manic symptoms (decreased need for sleep, increased energy or pressured speech).   Past Psychiatric History: Anxiety, depression and alcohol use disorder.   Risk to Self: Is patient at risk for suicide?: Yes Risk to Others:  None. Denies HI. Prior Inpatient Therapy:  Denies  Prior Outpatient Therapy:   He is followed by Dr. Carlean Purl.   Past Medical History:  Past Medical History:  Diagnosis Date  . Alcoholic cirrhosis of liver ([place])   . Alcoholism, chronic ([place])   . ANXIETY 06/23/2006  . Ascites 11/13/2009  . Ascites   . Avulsion fracture of middle phalanges of  3rd/4th fingers left hand 01/07/2014  . Blood dyscrasia    HIV  . Cellulitis of right  lower extremity 03/02/2016  . DEPRESSION 06/23/2006  . 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 Erectile dysfunction 07/14/2015  . 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 Gastric ulcer 06/2010  . GERD (gastroesophageal reflux disease)   . HIV DISEASE 06/23/2006  . HYPERLIPIDEMIA 06/23/2006  . HYPERTENSION 06/23/2006   denies  . IDIOPATHIC PERIPHERAL AUTONOMIC  NEUROPATHY UNSP 05/25/2007  . Iron deficiency anemia   . Left foot infection   . Obesity (BMI 30-39.9)    BMI 34 kg/m^2  . Portal hypertensive gastropathy ([place]) 01/02/2013  . Recent shoulder injury    left shoulder/fell in parking lot/ no surgery/December 26, 2016  . SBP (spontaneous bacterial peritonitis) ([place]) 05/03/2011   Suspected by high leukocytes on paracentesis. Clinical scenario also compatible. November 2012 responded to Levaquin. Started on trimethoprim-sulfamethoxazole double strength daily for prophylaxis.   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 Skin cancer    left calf  . STRAIN, CHEST WALL 03/15/2007  . Varices, esophageal ([place]) 06/2010  . Venous stasis 03/02/2016    Past Surgical History:  Procedure Laterality Date  . CARPAL TUNNEL RELEASE     left  . COLONOSCOPY  march 2013  . ESOPHAGEAL BANDING  05/01/2013   Procedure: ESOPHAGEAL BANDING;  Surgeon: Gatha Mayer, MD;  Location: [facility];  Service: Endoscopy;;  . ESOPHAGOGASTRODUODENOSCOPY  07/01/2010;  08/12/10   small varices, gastric ulcer  . ESOPHAGOGASTRODUODENOSCOPY  10/26/2011   Procedure: ESOPHAGOGASTRODUODENOSCOPY (EGD);  Surgeon: Gatha Mayer, MD;  Location: [facility];  Service: Endoscopy;  Laterality: N/A;  . ESOPHAGOGASTRODUODENOSCOPY N/A 01/02/2013   Procedure: ESOPHAGOGASTRODUODENOSCOPY (EGD);  Surgeon: Gatha Mayer, MD;  Location: [facility];  Service: Endoscopy;  Laterality: N/A;  . ESOPHAGOGASTRODUODENOSCOPY N/A 04/23/2013   Procedure: ESOPHAGOGASTRODUODENOSCOPY (EGD);  Surgeon: Gatha Mayer, MD;  Location: [facility];  Service: Endoscopy;  Laterality: N/A;  . ESOPHAGOGASTRODUODENOSCOPY N/A 05/01/2013   Procedure: ESOPHAGOGASTRODUODENOSCOPY (EGD);  Surgeon: Gatha Mayer, MD;  Location: [facility];  Service: Endoscopy;  Laterality: N/A;  follow-up varices and possibly band them  . ESOPHAGOGASTRODUODENOSCOPY N/A 09/04/2013   Procedure: ESOPHAGOGASTRODUODENOSCOPY (EGD);  Surgeon: Gatha Mayer, MD;  Location: [facility];  Service: Endoscopy;  Laterality: N/A;  . ESOPHAGOGASTRODUODENOSCOPY N/A 09/10/2014   Procedure: ESOPHAGOGASTRODUODENOSCOPY (EGD);  Surgeon: Gatha Mayer, MD;  Location: [facility];  Service: Endoscopy;  Laterality: N/A;  . ESOPHAGOGASTRODUODENOSCOPY (EGD) WITH PROPOFOL N/A 08/21/2015   Procedure: ESOPHAGOGASTRODUODENOSCOPY (EGD) WITH PROPOFOL;  Surgeon: Gatha Mayer, MD;  Location: [facility];  Service: Endoscopy;  Laterality: N/A;  . ESOPHAGOGASTRODUODENOSCOPY (EGD) WITH PROPOFOL N/A 04/06/2017   Procedure: ESOPHAGOGASTRODUODENOSCOPY (EGD) WITH PROPOFOL;  Surgeon: Gatha Mayer, MD;  [facility]: [facility];  Service: Endoscopy;  Laterality: N/A;  . ESOPHAGOGASTRODUODENOSCOPY W/ BANDING  06/26/2010   variceal ligation  . GASTRIC VARICES BANDING N/A 01/02/2013   Procedure: GASTRIC VARICES BANDING;  Surgeon: Gatha Mayer, MD;  Location: [facility];  Service: Endoscopy;  Laterality: N/A;  possible banding  . UPPER GASTROINTESTINAL ENDOSCOPY     Family History:  Family History  Problem Relation Age of Onset  . Hyperlipidemia Mother   . Hypertension Mother   . Breast cancer Mother        questionable  . Heart disease Mother   . Hypertension Father   . Irritable bowel syndrome Father   . Drug abuse Brother   . Heart disease Brother   . Breast cancer Maternal Aunt        maternal great aunt  . Alcohol abuse Other   .  Heart disease Maternal Uncle   . Irritable bowel syndrome Paternal Aunt   . Colon cancer Neg Hx    Family Psychiatric  History: Denies  Social History:  Social History   Substance and Sexual Activity  Alcohol Use Yes   Comment: pt is an alcoholic currently in [employer] meetings     Social History   Substance and Sexual Activity  Drug Use No    Social History   Socioeconomic History  . Marital status: Single    Spouse name: Not on file  . Number of children: 0  . Years of education: Not on file  . Highest  education level: Not on file  Occupational History  . Occupation: disability  Social Needs  . Financial resource strain: Not on file  . Food insecurity:    Worry: Not on file    Inability: Not on file  . Transportation needs:    Medical: Not on file    Non-medical: Not on file  Tobacco Use  . Smoking status: Former Smoker    Packs/day: 0.10    Years: 10.00    Pack years: 1.00    Types: Cigarettes    Start date: 03/03/2014    Last attempt to quit: 04/03/2014    Years since quitting: 3.5  . Smokeless tobacco: Never Used  . Tobacco comment: Chews nicorette gum.  Substance and Sexual Activity  . Alcohol use: Yes    Comment: pt is an alcoholic currently in [employer]  . Drug use: No  . Sexual activity: Yes    Partners: Male    Birth control/protection: Condom    Comment: pt. declined condoms  Lifestyle  . Physical activity:    Days per week: Not on file    Minutes per session: Not on file  . Stress: Not on file  Relationships  . Social connections:    Talks on phone: Not on file    Gets together: Not on file    Attends religious service: Not on file    Active member of club or organization: Not on file    Attends meetings of clubs or organizations: Not on file    Relationship status: Not on file  Other Topics Concern  . Not on file  Social History Narrative   Single, disabled hair stylist   Lives with parents in a home with a basement.  Parents do not like for him to go down stairs because they are steep.           Additional Social History: He lives with his father.  He has never been married and does not have children. He receives disability for medical conditions.  He denies alcohol or illicit substance use. Per chart review, he has a history of alcoholism and attends AA meetings.     Allergies:   Allergies  Allergen Reactions  . Penicillins     REACTION: hives Has patient had a PCN reaction causing immediate rash, facial/tongue/throat swelling, SOB  or lightheadedness with hypotension: Yes Has patient had a PCN reaction causing severe rash involving mucus membranes or skin necrosis: Yes Has patient had a PCN reaction that required hospitalization No Has patient had a PCN reaction occurring within the last 10 years: Yes If all of the above answers are "NO", then may proceed with Cephalosporin use./Per pt makes him feel weird!   . Sulfa Antibiotics     Pt states he did not have a reaction last time to Sulfa!    Labs:  Results for orders placed or performed during the hospital encounter of 10/02/17 (from the past 48 hour(s))  Comprehensive metabolic panel     Status: Abnormal   Collection Time: 10/02/17  7:12 PM  Result Value Ref Range   Sodium 142 135 - 145 mmol/L   Potassium 3.9 3.5 - 5.1 mmol/L   Chloride 105 101 - 111 mmol/L   CO2 25 22 - 32 mmol/L   Glucose, Bld 107 (H) 65 - 99 mg/dL   BUN 15 6 - 20 mg/dL   Creatinine, Ser 1.17 0.61 - 1.24 mg/dL   Calcium 9.6 8.9 - 10.3 mg/dL   Total Protein 6.5 6.5 - 8.1 g/dL   Albumin 3.7 3.5 - 5.0 g/dL   AST 28 15 - 41 U/L   ALT 20 17 - 63 U/L   Alkaline Phosphatase 112 38 - 126 U/L   Total Bilirubin 1.2 0.3 - 1.2 mg/dL   GFR calc non Af Amer >60 >60 mL/min   GFR calc Af Amer >60 >60 mL/min    Comment: (NOTE) The eGFR has been calculated using the CKD EPI equation. This calculation has not been validated in all clinical situations. eGFR's persistently <60 mL/min signify possible Chronic Kidney Disease.    Anion gap 12 5 - 15    Comment: Performed at [facility], [place] [address] [place], [place] [address]  Ethanol     Status: None   Collection Time: 10/02/17  7:12 PM  Result Value Ref Range   Alcohol, Ethyl (B) <10 <10 mg/dL    Comment:        LOWEST DETECTABLE LIMIT FOR SERUM ALCOHOL IS 10 mg/dL FOR MEDICAL PURPOSES ONLY Performed at [place] [address] [place], [place] [address]   CBC with Diff     Status: Abnormal    Collection Time: 10/02/17  7:12 PM  Result Value Ref Range   WBC 2.4 (L) 4.0 - 10.5 K/uL   RBC 4.18 (L) 4.22 - 5.81 MIL/uL   Hemoglobin 11.3 (L) 13.0 - 17.0 g/dL   HCT 35.1 (L) 39.0 - 52.0 %   MCV 84.0 78.0 - 100.0 fL   MCH 27.0 26.0 - 34.0 pg   MCHC 32.2 30.0 - 36.0 g/dL   RDW 16.4 (H) 11.5 - 15.5 %   Platelets 66 (L) 150 - 400 K/uL    Comment: SPECIMEN CHECKED FOR CLOTS REPEATED TO VERIFY PLATELET COUNT CONFIRMED BY SMEAR    Neutrophils Relative % 60 %   Neutro Abs 1.4 (L) 1.7 - 7.7 K/uL   Lymphocytes Relative 25 %   Lymphs Abs 0.6 (L) 0.7 - 4.0 K/uL   Monocytes Relative 12 %   Monocytes Absolute 0.3 0.1 - 1.0 K/uL   Eosinophils Relative 3 %   Eosinophils Absolute 0.1 0.0 - 0.7 K/uL   Basophils Relative 0 %   Basophils Absolute 0.0 0.0 - 0.1 K/uL    Comment: Performed at [facility], [place] [address], [place], [place] [address]  Acetaminophen level     Status: Abnormal   Collection Time: 10/02/17  7:12 PM  Result Value Ref Range   Acetaminophen (Tylenol), Serum <10 (L) 10 - 30 ug/mL    Comment:        THERAPEUTIC CONCENTRATIONS VARY SIGNIFICANTLY. A RANGE OF 10-30 ug/mL MAY BE AN EFFECTIVE CONCENTRATION FOR MANY PATIENTS. HOWEVER, SOME ARE BEST TREATED AT CONCENTRATIONS OUTSIDE THIS RANGE. ACETAMINOPHEN CONCENTRATIONS >150 ug/mL AT 4 HOURS AFTER INGESTION AND >50  ug/mL AT 12 HOURS AFTER INGESTION ARE OFTEN ASSOCIATED WITH TOXIC REACTIONS. Performed at [facility], [place] [address] [place], [place] [address]   Salicylate level     Status: None   Collection Time: 10/02/17  7:12 PM  Result Value Ref Range   Salicylate Lvl <4.0 2.8 - 30.0 mg/dL    Comment: Performed at [facility], [place] [address] [place], [place] [address]  Ammonia     Status: Abnormal   Collection Time: 10/02/17  8:30 PM  Result Value Ref Range   Ammonia 94 (H) 9 - 35 umol/L    Comment: Performed at [facility], [place]  [address] [place], [place] [address]  Comprehensive metabolic panel     Status: Abnormal   Collection Time: 10/03/17  6:00 AM  Result Value Ref Range   Sodium 145 135 - 145 mmol/L   Potassium 3.8 3.5 - 5.1 mmol/L   Chloride 109 101 - 111 mmol/L   CO2 23 22 - 32 mmol/L   Glucose, Bld 94 65 - 99 mg/dL   BUN 13 6 - 20 mg/dL   Creatinine, Ser 1.03 0.61 - 1.24 mg/dL   Calcium 9.2 8.9 - 10.3 mg/dL   Total Protein 6.2 (L) 6.5 - 8.1 g/dL   Albumin 3.4 (L) 3.5 - 5.0 g/dL   AST 29 15 - 41 U/L   ALT 19 17 - 63 U/L   Alkaline Phosphatase 101 38 - 126 U/L   Total Bilirubin 1.8 (H) 0.3 - 1.2 mg/dL   GFR calc non Af Amer >60 >60 mL/min   GFR calc Af Amer >60 >60 mL/min    Comment: (NOTE) The eGFR has been calculated using the CKD EPI equation. This calculation has not been validated in all clinical situations. eGFR's persistently <60 mL/min signify possible Chronic Kidney Disease.    Anion gap 13 5 - 15    Comment: Performed at [facility], [place] [address] [place], [place] [address]  CBC     Status: Abnormal   Collection Time: 10/03/17  6:00 AM  Result Value Ref Range   WBC 2.3 (L) 4.0 - 10.5 K/uL   RBC 4.17 (L) 4.22 - 5.81 MIL/uL   Hemoglobin 11.4 (L) 13.0 - 17.0 g/dL   HCT 35.3 (L) 39.0 - 52.0 %   MCV 84.7 78.0 - 100.0 fL   MCH 27.3 26.0 - 34.0 pg   MCHC 32.3 30.0 - 36.0 g/dL   RDW 16.7 (H) 11.5 - 15.5 %   Platelets 66 (L) 150 - 400 K/uL    Comment: CONSISTENT WITH PREVIOUS RESULT Performed at [place] [address] [place], [place] [address]     Current Facility-Administered Medications  Medication Dose Route Frequency Provider Last Rate Last Dose  . 0.9 %  sodium chloride infusion  250 mL Intravenous PRN Jani Gravel, MD      . acetaminophen (TYLENOL) tablet 650 mg  650 mg Oral Q6H PRN Jani Gravel, MD   650 mg at 10/03/17 5885   Or  . acetaminophen (TYLENOL) suppository 650 mg  650 mg Rectal Q6H PRN Jani Gravel, MD      . aMILoride  [facility]) tablet 15 mg  15 mg Oral Daily Jani Gravel, MD      . dolutegravir (TIVICAY) tablet 50 mg  50 mg Oral Daily Jani Gravel, MD      . emtricitabine-tenofovir AF (DESCOVY) 200-25 MG per tablet 1 tablet  1 tablet  Oral Daily Jani Gravel, MD      . furosemide (LASIX) injection 40 mg  40 mg Intravenous Daily Jani Gravel, MD      . furosemide (LASIX) tablet 40 mg  40 mg Oral BID Jani Gravel, MD      . lactulose [facility]) 10 GM/15ML solution 60 g  60 g Oral QID Jani Gravel, MD   Stopped at 10/03/17 [phone]  . pantoprazole (PROTONIX) EC tablet 40 mg  40 mg Oral Daily Jani Gravel, MD      . potassium chloride (K-DUR,KLOR-CON) CR tablet 40 mEq  40 mEq Oral BID Jani Gravel, MD   40 mEq at 10/03/17 0024  . rifaximin (XIFAXAN) tablet 550 mg  550 mg Oral BID Jani Gravel, MD   550 mg at 10/03/17 0025  . senna (SENOKOT) tablet 8.6 mg  1 tablet Oral TID Jani Gravel, MD   8.6 mg at 10/03/17 0024  . simethicone (MYLICON) chewable tablet 80 mg  80 mg Oral TID PRN Jani Gravel, MD      . sodium chloride flush (NS) 0.9 % injection 3 mL  3 mL Intravenous Q12H Jani Gravel, MD   3 mL at 10/03/17 0950  . sodium chloride flush (NS) 0.9 % injection 3 mL  3 mL Intravenous PRN Jani Gravel, MD      . sodium phosphate (FLEET) 7-19 GM/118ML enema 1 enema  1 enema Rectal QHS PRN Jani Gravel, MD        Musculoskeletal: Strength & Muscle Tone: Generalized weakness.  Gait & Station: UTA since patient was lying in bed. Patient leans: N/A  Psychiatric Specialty Exam: Physical Exam  Nursing note and vitals reviewed. Constitutional: He is oriented to person, place, and time. He appears well-developed and well-nourished.  HENT:  Head: Normocephalic and atraumatic.  Neck: Normal range of motion.  Respiratory: Effort normal.  Musculoskeletal: Normal range of motion.  Neurological: He is alert and oriented to person, place, and time.  Psychiatric: His speech is normal and behavior is normal. Thought content normal. Cognition and memory  are normal. He expresses impulsivity. He exhibits a depressed mood.    Review of Systems  Constitutional: Negative for chills and fever.  Cardiovascular: Negative for chest pain.  Gastrointestinal: Negative for abdominal pain, constipation, diarrhea, nausea and vomiting.  Psychiatric/Behavioral: Positive for depression and suicidal ideas. Negative for hallucinations and substance abuse. The patient is nervous/anxious. The patient does not have insomnia.   All other systems reviewed and are negative.   Blood pressure 131/73, pulse 85, temperature 97.6 F (36.4 C), temperature source Oral, resp. rate 20, height _0  (1.803 m), weight (!) 148.4 kg (327 lb 2.6 oz), SpO2 98 %.Body mass index is 45.63 kg/m.  General Appearance: Fairly Groomed, morbidly obese, middle aged, Caucasian male, wearing paper hospital scrubs with unshaved face and lying in bed. NAD.  Eye Contact:  Poor since mostly closes his eyes while speaking secondary to drowsiness.  Speech:  Slow and Slurred  Volume:  Decreased  Mood:  Depressed  Affect:  Congruent  Thought Process:  Goal Directed, Linear and Descriptions of Associations: Intact  Orientation:  Full (Time, Place, and Person)  Thought Content:  Logical  Suicidal Thoughts:  Yes.  with intent/plan  Homicidal Thoughts:  No  Memory:  Immediate;   Good Recent;   Good Remote;   Good  Judgement:  Fair  Insight:  Fair  Psychomotor Activity:  Normal  Concentration:  Concentration: Fair and Attention Span: Fair  Recall:  Good  Fund of Knowledge:  Good  Language:  Good  Akathisia:  No  Handed:  Right  AIMS (if indicated):   N/A  Assets:  Financial Resources/Insurance Housing  ADL's:  Intact  Cognition:  WNL  Sleep:   Okay   Assessment:  [name] is a 52 y.o. male who was admitted with AMS following a suicide attempt by Xanax overdose. He endorses SI due to grieving the loss of his mother. He reports that he cannot live without her. He warrants inpatient  psychiatric hospitalization due to high risk of harm to self.   Treatment Plan Summary: --Patient warrants inpatient psychiatric hospitalization given high risk of harm to self. -Continue bedside sitter.  -Continue to hold Xanax given recent overdose. Place on CIWA protocol to monitor for withdrawal.  -Please pursue involuntary commitment if patient refuses voluntary psychiatric hospitalization or attempts to leave the hospital.  -Will sign off on patient at this time. Please consult psychiatry again as needed.     Disposition: Recommend psychiatric Inpatient admission when medically cleared.  Faythe Dingwall, DO 10/03/2017 10:09 AM

## 2017-10-04 DIAGNOSIS — Z811 Family history of alcohol abuse and dependence: Secondary | ICD-10-CM

## 2017-10-04 DIAGNOSIS — Z87891 Personal history of nicotine dependence: Secondary | ICD-10-CM

## 2017-10-04 DIAGNOSIS — T1491XA Suicide attempt, initial encounter: Secondary | ICD-10-CM

## 2017-10-04 DIAGNOSIS — F332 Major depressive disorder, recurrent severe without psychotic features: Secondary | ICD-10-CM

## 2017-10-04 DIAGNOSIS — Z736 Limitation of activities due to disability: Secondary | ICD-10-CM

## 2017-10-04 DIAGNOSIS — R4182 Altered mental status, unspecified: Secondary | ICD-10-CM

## 2017-10-04 DIAGNOSIS — T424X2A Poisoning by benzodiazepines, intentional self-harm, initial encounter: Secondary | ICD-10-CM

## 2017-10-04 DIAGNOSIS — F419 Anxiety disorder, unspecified: Secondary | ICD-10-CM

## 2017-10-04 DIAGNOSIS — R450 Nervousness: Secondary | ICD-10-CM

## 2017-10-04 DIAGNOSIS — Z634 Disappearance and death of family member: Secondary | ICD-10-CM

## 2017-10-04 DIAGNOSIS — Z813 Family history of other psychoactive substance abuse and dependence: Secondary | ICD-10-CM

## 2017-10-04 LAB — BASIC METABOLIC PANEL
Anion gap: 13 (ref 5–15)
BUN: 10 mg/dL (ref 6–20)
CALCIUM: 8.9 mg/dL (ref 8.9–10.3)
CHLORIDE: 104 mmol/L (ref 101–111)
CO2: 25 mmol/L (ref 22–32)
CREATININE: 1.07 mg/dL (ref 0.61–1.24)
GFR calc Af Amer: >60 mL/min (ref >60)
GFR calc non Af Amer: >60 mL/min (ref >60)
Glucose, Bld: 129 mg/dL — ABNORMAL HIGH (ref 65–99)
Potassium: 3.9 mmol/L (ref 3.5–5.1)
Sodium: 142 mmol/L (ref 135–145)

## 2017-10-04 LAB — AMMONIA: Ammonia: 36 umol/L — ABNORMAL HIGH (ref 9–35)

## 2017-10-04 NOTE — Progress Notes (Signed)
PROGRESS NOTE  [name] BBC:[id number] DOB: [birth year] DOA: 10/02/2017 PCP: Greig Right, MD  Brief Narrative: 52 year old man PMH alcoholic cirrhosis, alcoholism, HIV, presented with history of confusion, reported history of Xanax misuse, excessive alcohol use, suicidal ideation.  He was admitted for hepatic encephalopathy, benzodiazepine misuse, suicidal ideation.  Assessment/Plan Acute hepatic encephalopathy secondary to alcoholic cirrhosis with associated complications of esophageal varices, thrombocytopenia, history of SBP --Improving clinically.  More responsive and awake.  Ammonia trending down.  Continue lactulose and rifaximin.  Taking medications today.  Suicidal ideation --Await psychiatric evaluation when the patient is able to participate.  Continue Air cabin crew.  Alcoholism --Now evidence of withdrawal.  Xanax overuse.  Prescribed 60 tablets April 9.  Per family all pills gone by 4/21. --Likely a major factor in his somnolence.  He is gradually improving.  Continues to protect airway.  Grief reaction secondary to death of mother September 25, 2022. --Await psychiatry recommendations  HIV.  Last RNA quant was undetectable March 2019.  CD4 280 March 2019 --Continue dolutegravir, emtricitabine-tenofovir.  Acquired pancytopenia.  Likely secondary to cirrhosis and HIV. --Was stable on last check.  No further evaluation suggested.  Morbid obesity. Body mass index is 45.02 kg/m. --Recommend weight loss as an outpatient under supervision of physician   Continue to monitor closely  DVT prophylaxis: SCDs Code Status: full Family Communication: none Disposition Plan: pending    Murray Hodgkins, MD  [facility] Direct contact: [phone] --[place] [place]  --[email or web address]; password [email or web address]  7PM-7AM contact night coverage as above 10/04/2017, 3:33 PM  LOS: 2 days   Consultants:    Procedures:    Antimicrobials:    Interval history/Subjective: Per  sitter at bedside, patient was awake this morning and ate breakfast.  He still is sleeping quite a bit.  Patient awakens to voice and answer simple questions.  Feels better.  Reports that he is catching up on sleep.  Objective: Vitals:  Vitals:   10/04/17 0550 10/04/17 1328  BP: 125/62 123/75  Pulse: 78 80  Resp: 20 16  Temp: 98.5 F (36.9 C) 98.6 F (37 C)  SpO2: 98% 94%    Exam:  Constitutional:   . Appears calm and comfortable Eyes:  . pupils and irises appear normal ENMT:  . grossly normal hearing  Respiratory:  . CTA bilaterally, no w/r/r.  . Respiratory effort normal Cardiovascular:  . RRR, no m/r/g . No LE extremity edema   Abdomen:  . Abdomen obese Musculoskeletal:  . RUE, LUE, RLE, LLE   o strength and tone normal, no atrophy, no abnormal movements . Moves all extremities to command Neurologic:  . Grossly nonfocal Psychiatric:  . Mental status . Difficult to assess mood, affect judgment or insight but he does awaken to voice and follows simple commands.  I have personally reviewed the following:   Labs:  Basic metabolic panel unremarkable.  Ammonia continues to trend hours, 36  Scheduled Meds: . aMILoride  15 mg Oral Daily  . dolutegravir  50 mg Oral Daily  . emtricitabine-tenofovir AF  1 tablet Oral Daily  . furosemide  40 mg Intravenous Daily  . lactulose  60 g Oral QID  . pantoprazole  40 mg Oral Daily  . potassium chloride SA  40 mEq Oral BID  . rifaximin  550 mg Oral BID  . sodium chloride flush  3 mL Intravenous Q12H   Continuous Infusions: . sodium chloride      Principal Problem:   Hepatic encephalopathy (HCC) Active Problems:  Human immunodeficiency virus (HIV) disease ([place])   Alcoholic cirrhosis of liver (HCC)   Acquired pancytopenia (HCC)   Suicidal ideation   Alcoholism ([place])   Obesity, Class III, BMI 40-49.9 (morbid obesity) ([place])   LOS: 2 days

## 2017-10-05 ENCOUNTER — Inpatient Hospital Stay (HOSPITAL_COMMUNITY)
Admission: AD | Admit: 2017-10-05 | Discharge: 2017-10-21 | DRG: 885 | Disposition: A | Discharge status: Home / Self Care | Payer: Medicaid Other | Source: Intra-hospital | Attending: Psychiatry | Admitting: Psychiatry

## 2017-10-05 ENCOUNTER — Telehealth: Payer: Self-pay | Admitting: Internal Medicine

## 2017-10-05 ENCOUNTER — Encounter (HOSPITAL_COMMUNITY): Payer: Self-pay

## 2017-10-05 ENCOUNTER — Other Ambulatory Visit: Payer: Self-pay

## 2017-10-05 DIAGNOSIS — F332 Major depressive disorder, recurrent severe without psychotic features: Secondary | ICD-10-CM | POA: Diagnosis present

## 2017-10-05 DIAGNOSIS — E785 Hyperlipidemia, unspecified: Secondary | ICD-10-CM | POA: Diagnosis present

## 2017-10-05 DIAGNOSIS — T424X2A Poisoning by benzodiazepines, intentional self-harm, initial encounter: Secondary | ICD-10-CM | POA: Diagnosis not present

## 2017-10-05 DIAGNOSIS — K7290 Hepatic failure, unspecified without coma: Secondary | ICD-10-CM

## 2017-10-05 DIAGNOSIS — K219 Gastro-esophageal reflux disease without esophagitis: Secondary | ICD-10-CM | POA: Diagnosis not present

## 2017-10-05 DIAGNOSIS — R45851 Suicidal ideations: Secondary | ICD-10-CM | POA: Diagnosis not present

## 2017-10-05 DIAGNOSIS — Z87891 Personal history of nicotine dependence: Secondary | ICD-10-CM

## 2017-10-05 DIAGNOSIS — Z9283 Personal history of failed moderate sedation: Secondary | ICD-10-CM

## 2017-10-05 DIAGNOSIS — B20 Human immunodeficiency virus [HIV] disease: Secondary | ICD-10-CM

## 2017-10-05 DIAGNOSIS — G4700 Insomnia, unspecified: Secondary | ICD-10-CM | POA: Diagnosis present

## 2017-10-05 DIAGNOSIS — F04 Amnestic disorder due to known physiological condition: Secondary | ICD-10-CM | POA: Diagnosis not present

## 2017-10-05 DIAGNOSIS — R451 Restlessness and agitation: Secondary | ICD-10-CM | POA: Diagnosis not present

## 2017-10-05 DIAGNOSIS — Z79899 Other long term (current) drug therapy: Secondary | ICD-10-CM | POA: Diagnosis not present

## 2017-10-05 DIAGNOSIS — F1020 Alcohol dependence, uncomplicated: Secondary | ICD-10-CM

## 2017-10-05 DIAGNOSIS — Z21 Asymptomatic human immunodeficiency virus [HIV] infection status: Secondary | ICD-10-CM | POA: Diagnosis present

## 2017-10-05 DIAGNOSIS — Z8249 Family history of ischemic heart disease and other diseases of the circulatory system: Secondary | ICD-10-CM

## 2017-10-05 DIAGNOSIS — R748 Abnormal levels of other serum enzymes: Secondary | ICD-10-CM | POA: Diagnosis not present

## 2017-10-05 DIAGNOSIS — I10 Essential (primary) hypertension: Secondary | ICD-10-CM | POA: Diagnosis not present

## 2017-10-05 DIAGNOSIS — F1021 Alcohol dependence, in remission: Secondary | ICD-10-CM | POA: Diagnosis present

## 2017-10-05 DIAGNOSIS — T510X2A Toxic effect of ethanol, intentional self-harm, initial encounter: Secondary | ICD-10-CM | POA: Diagnosis present

## 2017-10-05 DIAGNOSIS — R413 Other amnesia: Secondary | ICD-10-CM | POA: Diagnosis not present

## 2017-10-05 DIAGNOSIS — Z813 Family history of other psychoactive substance abuse and dependence: Secondary | ICD-10-CM | POA: Diagnosis not present

## 2017-10-05 DIAGNOSIS — T1491XA Suicide attempt, initial encounter: Secondary | ICD-10-CM | POA: Diagnosis not present

## 2017-10-05 DIAGNOSIS — Z85828 Personal history of other malignant neoplasm of skin: Secondary | ICD-10-CM | POA: Diagnosis not present

## 2017-10-05 DIAGNOSIS — D61818 Other pancytopenia: Secondary | ICD-10-CM | POA: Diagnosis not present

## 2017-10-05 DIAGNOSIS — K7030 Alcoholic cirrhosis of liver without ascites: Secondary | ICD-10-CM | POA: Diagnosis not present

## 2017-10-05 DIAGNOSIS — E876 Hypokalemia: Secondary | ICD-10-CM | POA: Diagnosis not present

## 2017-10-05 DIAGNOSIS — G8929 Other chronic pain: Secondary | ICD-10-CM | POA: Diagnosis not present

## 2017-10-05 DIAGNOSIS — Z6841 Body Mass Index (BMI) 40.0 and over, adult: Secondary | ICD-10-CM

## 2017-10-05 DIAGNOSIS — E512 Wernicke's encephalopathy: Secondary | ICD-10-CM | POA: Insufficient documentation

## 2017-10-05 DIAGNOSIS — K7682 Hepatic encephalopathy: Secondary | ICD-10-CM | POA: Diagnosis present

## 2017-10-05 DIAGNOSIS — Z811 Family history of alcohol abuse and dependence: Secondary | ICD-10-CM | POA: Diagnosis not present

## 2017-10-05 DIAGNOSIS — K7469 Other cirrhosis of liver: Secondary | ICD-10-CM | POA: Diagnosis not present

## 2017-10-05 MED ORDER — ACETAMINOPHEN 325 MG PO TABS: 650.0000 mg | ORAL_TABLET | Freq: Four times a day (QID) | ORAL | Status: DC | PRN

## 2017-10-05 MED ORDER — PANTOPRAZOLE SODIUM 40 MG PO TBEC
40.0000 mg | DELAYED_RELEASE_TABLET | Freq: Every day | ORAL | Status: DC
  Administered 2017-10-06 – 2017-10-21 (×16): 40 mg via ORAL
  Filled 2017-10-05 (×18): qty 1

## 2017-10-05 MED ORDER — AMILORIDE HCL 5 MG PO TABS
15.0000 mg | ORAL_TABLET | Freq: Every day | ORAL | Status: DC
  Administered 2017-10-06 – 2017-10-21 (×16): 15 mg via ORAL
  Filled 2017-10-05 (×18): qty 3

## 2017-10-05 MED ORDER — FUROSEMIDE 40 MG PO TABS
40.0000 mg | ORAL_TABLET | Freq: Every day | ORAL | Status: DC
  Administered 2017-10-06 – 2017-10-21 (×17): 40 mg via ORAL
  Filled 2017-10-05: qty 1
  Filled 2017-10-05: qty 2
  Filled 2017-10-05 (×17): qty 1

## 2017-10-05 MED ORDER — FUROSEMIDE 10 MG/ML IJ SOLN: 40.0000 mg | Freq: Every day | INTRAMUSCULAR | Status: DC

## 2017-10-05 MED ORDER — POTASSIUM CHLORIDE CRYS ER 20 MEQ PO TBCR
40.0000 meq | EXTENDED_RELEASE_TABLET | Freq: Two times a day (BID) | ORAL | Status: DC
  Administered 2017-10-05 – 2017-10-21 (×32): 40 meq via ORAL
  Filled 2017-10-05 (×38): qty 2

## 2017-10-05 MED ORDER — ACETAMINOPHEN 650 MG RE SUPP
650.0000 mg | Freq: Four times a day (QID) | RECTAL | Status: DC | PRN
  Filled 2017-10-05: qty 1

## 2017-10-05 MED ORDER — DOLUTEGRAVIR SODIUM 50 MG PO TABS
50.0000 mg | ORAL_TABLET | Freq: Every day | ORAL | Status: DC
  Administered 2017-10-06 – 2017-10-21 (×16): 50 mg via ORAL
  Filled 2017-10-05 (×18): qty 1

## 2017-10-05 MED ORDER — EMTRICITABINE-TENOFOVIR AF 200-25 MG PO TABS
1.0000 | ORAL_TABLET | Freq: Every day | ORAL | Status: DC
Start: 2017-10-06 — End: 2017-10-21
  Administered 2017-10-06 – 2017-10-21 (×16): 1 via ORAL
  Filled 2017-10-05 (×18): qty 1

## 2017-10-05 MED ORDER — SIMETHICONE 80 MG PO CHEW: 80.0000 mg | CHEWABLE_TABLET | Freq: Three times a day (TID) | ORAL | Status: DC | PRN

## 2017-10-05 MED ORDER — RIFAXIMIN 550 MG PO TABS
550.0000 mg | ORAL_TABLET | Freq: Two times a day (BID) | ORAL | Status: DC
  Administered 2017-10-05 – 2017-10-21 (×32): 550 mg via ORAL
  Filled 2017-10-05 (×39): qty 1

## 2017-10-05 NOTE — Progress Notes (Addendum)
PROGRESS NOTE    [name]  EXH:[id number] DOB: Jun 10, 1966 DOA: 10/02/2017 PCP: Greig Right, MD   Brief Narrative: [name] is a 52 y.o. male with a history of alcoholic cirrhosis, alcohol abuse, HIV.  Patient presented secondary to benzodiazepine overdose and significant grief reaction in setting of recent loss of his mother.  His hospitalization was also complicated by acute hepatic encephalopathy which has resolved with treatment.  Psychiatry has recommended inpatient behavioral health admission.  Recommends IVC if patient is unwilling to be admitted or tries to leave AMA.   Assessment & Plan:   Principal Problem:   MDD (major depressive disorder), recurrent severe, without psychosis ([place]) Active Problems:   Human immunodeficiency virus (HIV) disease ([place])   Alcoholic cirrhosis of liver (HCC)   Acquired pancytopenia ([place])   Hepatic encephalopathy (HCC)   Suicidal ideation   Alcoholism ([place])   Obesity, Class III, BMI 40-49.9 (morbid obesity) ([place])   Acute hepatic encephalopathy Secondary to alcoholic cirrhosis.  Patient was treated with lactulose and rifaximin.  Mental status has improved and is currently at baseline. -Continue lactulose and rifaximin; titrate lactulose for 2-3 soft bowel movements per day  Suicidal ideation/gesture Benzodiazepine overdose Grief reaction/Major depression Psychiatry evaluated and recommends inpatient behavioral health admission. -Social work consult for placement -If patient tries to leave AMA, will IVC.  HIV Last quant was undetectable in March 2019 last CD4 count of 280 March 2019. -Continue Tivicay and Descovy  Acquired pancytopenia In setting of cirrhosis and HIV. ANC of 1400 -Path smear review  Morbid obesity Body mass index is 45.54 kg/m.    DVT prophylaxis: SCDs Code Status:   Code Status: Full Code Family Communication: None at bedside Disposition Plan: Discharge to behavioral health inpatient  psychiatry   Consultants:   Psychiatry  Procedures:   None  Antimicrobials:  None   Subjective: Patient reports no issues overnight.  Objective: Vitals:   10/04/17 1328 10/04/17 2032 10/05/17 0447 10/05/17 0650  BP: 123/75 122/74  (!) 114/54  Pulse: 80 79  75  Resp: 16 18  16   Temp: 98.6 F (37 C) 98.6 F (37 C)  97.9 F (36.6 C)  TempSrc: Oral Oral  Oral  SpO2: 94% 93%  94%  Weight:   (!) 148.1 kg (326 lb 8 oz)   Height:        Intake/Output Summary (Last 24 hours) at 10/05/2017 0856 Last data filed at 10/05/2017 0745 Gross per 24 hour  Intake 2040 ml  Output 1000 ml  Net 1040 ml   Filed Weights   10/03/17 0237 10/04/17 0500 10/05/17 0447  Weight: (!) 148.4 kg (327 lb 2.6 oz) (!) 146.4 kg (322 lb 12.1 oz) (!) 148.1 kg (326 lb 8 oz)    Examination:  General exam: Appears calm and comfortable Respiratory system: Clear to auscultation. Respiratory effort normal. Cardiovascular system: S1 & S2 heard, RRR. No murmurs, rubs, gallops or clicks. Gastrointestinal system: Abdomen is nondistended, soft and nontender. No organomegaly or masses felt. Normal bowel sounds heard. Central nervous system: Alert and oriented. No focal neurological deficits. Extremities: No edema. No calf tenderness Skin: No cyanosis. No rashes Psychiatry: Denies suicidal ideation    Data Reviewed: I have personally reviewed following labs and imaging studies  CBC: Recent Labs  Lab 10/02/17 1912 10/03/17 0600  WBC 2.4* 2.3*  NEUTROABS 1.4*  --   HGB 11.3* 11.4*  HCT 35.1* 35.3*  MCV 84.0 84.7  PLT 66* 66*   Basic Metabolic Panel: Recent  Labs  Lab 10/02/17 1912 10/03/17 0600 10/04/17 0627  NA 142 145 142  K 3.9 3.8 3.9  CL 105 109 104  CO2 25 23 25   GLUCOSE 107* 94 129*  BUN 15 13 10   CREATININE 1.17 1.03 1.07  CALCIUM 9.6 9.2 8.9   GFR: Estimated Creatinine Clearance: 119.3 mL/min (by C-G formula based on SCr of 1.07 mg/dL). Liver Function Tests: Recent Labs   Lab 10/02/17 1912 10/03/17 0600  AST 28 29  ALT 20 19  ALKPHOS 112 101  BILITOT 1.2 1.8*  PROT 6.5 6.2*  ALBUMIN 3.7 3.4*   No results for input(s): LIPASE, AMYLASE in the last 168 hours. Recent Labs  Lab 10/02/17 2030 10/03/17 1003 10/04/17 0627  AMMONIA 94* 68* 36*   Coagulation Profile: No results for input(s): INR, PROTIME in the last 168 hours. Cardiac Enzymes: No results for input(s): CKTOTAL, CKMB, CKMBINDEX, TROPONINI in the last 168 hours. BNP (last 3 results) No results for input(s): PROBNP in the last 8760 hours. HbA1C: No results for input(s): HGBA1C in the last 72 hours. CBG: No results for input(s): GLUCAP in the last 168 hours. Lipid Profile: No results for input(s): CHOL, HDL, LDLCALC, TRIG, CHOLHDL, LDLDIRECT in the last 72 hours. Thyroid Function Tests: No results for input(s): TSH, T4TOTAL, FREET4, T3FREE, THYROIDAB in the last 72 hours. Anemia Panel: No results for input(s): VITAMINB12, FOLATE, FERRITIN, TIBC, IRON, RETICCTPCT in the last 72 hours. Sepsis Labs: No results for input(s): PROCALCITON, LATICACIDVEN in the last 168 hours.  No results found for this or any previous visit (from the past 240 hour(s)).       Radiology Studies: No results found.      Scheduled Meds: . aMILoride  15 mg Oral Daily  . dolutegravir  50 mg Oral Daily  . emtricitabine-tenofovir AF  1 tablet Oral Daily  . furosemide  40 mg Intravenous Daily  . lactulose  60 g Oral QID  . pantoprazole  40 mg Oral Daily  . potassium chloride SA  40 mEq Oral BID  . rifaximin  550 mg Oral BID  . sodium chloride flush  3 mL Intravenous Q12H   Continuous Infusions: . sodium chloride       LOS: 3 days     Cordelia Poche, MD [facility] 10/05/2017, 8:56 AM Pager: [phone]  If 7PM-7AM, please contact night-coverage [email or web address] Password [facility] 10/05/2017, 8:56 AM

## 2017-10-05 NOTE — Clinical Social Work Note (Signed)
Clinical Social Work Assessment  Patient Details  Name: [name] MRN: [id number] Date of Birth: Feb 28, 1966  Date of referral:  10/05/17               Reason for consult:  Facility Placement                Permission sought to share information with:  Case Manager, Customer service manager, Family Supports Permission granted to share information::  Yes, Verbal Permission Granted  Name::     Chartered certified accountant::  Inpatient Psych  Relationship::  Brother  Contact Information:     Housing/Transportation Living arrangements for the past 2 months:  Single Family Home Source of Information:  Patient Patient Interpreter Needed:  None Criminal Activity/Legal Involvement Pertinent to Current Situation/Hospitalization:  No - Comment as needed Significant Relationships:  Parents Lives with:  Parents Do you feel safe going back to the place where you live?  Yes Need for family participation in patient care:  Yes (Comment)  Care giving concerns:  No care giving concerns at the time of assessment.    Social Worker assessment / plan:  LCSW following for inpatient psych placement.   Patient admitted for weakness. Patient seen by psych due to suicidal ideation.   LCSW met at bedside with patient. Patient has a Actuary. No family present.   LCSW explained role and reason for visit. LCSW discussed inpatient psych recommendation.  Patient states that he has never been to an inpatient facility in the past. Patient states that he was seeing a therapist until about 3 months ago when he moved back to [place] from [place]. He reporsts that he was referred to a new therapist in [place], but he stated the counselor was not a good fit for him.   Patient reports that he has been depressed since his mother passed 3 weeks ago. Patient lives at home with his elderly father. Patient does not work and receives a disability check. Patient reports that he had a fall about a year ago that got him down due  to physical limitations the fall caused.   Patient is agreeable to go voluntary to inpatient psych.  PLAN: Inpatient psych when medically cleared.   Employment status:  Unemployed Forensic scientist:  Medicaid In [place] PT Recommendations:  Not assessed at this time Information / Referral to community resources:     Patient/Family's Response to care:  Patient is thank ful for LCSW visit and resources.   Patient/Family's Understanding of and Emotional Response to Diagnosis, Current Treatment, and Prognosis:  Patient stated that he feels he can manage at home, but understand the recommendation. Patient expressed that he needs outpatient follow up. Patient agreeable to inpatient psych.   Emotional Assessment Appearance:  Appears younger than stated age Attitude/Demeanor/Rapport:    Affect (typically observed):  Calm Orientation:  Oriented to Self, Oriented to Place, Oriented to  Time, Oriented to Situation Alcohol / Substance use:  Alcohol Use Psych involvement (Current and /or in the community):  No (Comment)  Discharge Needs  Concerns to be addressed:  No discharge needs identified Readmission within the last 30 days:  No Current discharge risk:  None Barriers to Discharge:  Continued Medical Work up, Psych Bed not available   Servando Snare, LCSW 10/05/2017, 10:08 AM

## 2017-10-05 NOTE — Telephone Encounter (Signed)
Aunt wanted [place] to know that [name] is admitted to [facility] for an overdose of Xanax.  He will be going to psych at some point and thanked [place] for all the care.  [name]'s brother and her will be helping [name] when he comes home.

## 2017-10-05 NOTE — Discharge Summary (Signed)
Physician Discharge Summary  [name] NTI:[id number] DOB: Oct 13, 1965 DOA: 10/02/2017  PCP: Greig Right, MD  Admit date: 10/02/2017 Discharge date: 10/05/2017  Admitted From: Home Disposition: Inpatient behavioral health  Recommendations for Outpatient Follow-up:  1. Inpatient behavioral health 2. 2-3 soft bowel movements per day; decrease lactulose if having watery or very frequent stools 3. Please follow up on the following pending results: None   Discharge Condition: Guarded CODE STATUS: Full code Diet recommendation: Sodium restricted (2 grams per day)   Brief/Interim Summary:  Admission HPI written by Jani Gravel, MD    HPI:   [name]  is a 52 y.o. male, w Hiv, Etoh cirrhosis apparently had his mother pass and his family (cousin) was telling him that she was stressed due to taking care of him and that this contributed to her death. Pt was feeling guilty and started drinking and apparently has become more somnolent.  Pt has been taking xanax more frequently as well .  Pt was brought to ED for AMS.   InEd,  CT brain  IMPRESSION: No acute intracranial abnormality.  Na 142, K 3.9, Bun 15, Creatinine 1.17 Ast 28, Alt 20 Etoh <10 Wbc 2.4, Hgb 11.3, Plt 66 Tylenol <10 Ammonia 94  Pt will be admitted for altered mental status secondary to hepatic encephalopathy      Hospital course:  Acute hepatic encephalopathy Secondary to alcoholic cirrhosis.  Patient was treated with lactulose and rifaximin.  Mental status has improved and is currently at baseline. Continue lactulose and rifaximin; titrate lactulose for 2-3 soft bowel movements per day  Suicidal ideation/gesture Benzodiazepine overdose Grief reaction/Major depression Psychiatry evaluated and recommends inpatient behavioral health admission.  HIV Last quant was undetectable in March 2019 last CD4 count of 280 March 2019. Continued Tivicay and Descovy  Acquired pancytopenia In setting  of cirrhosis and HIV; possibly medication mediated. Afebrile. ANC of 1400. Path smear review. Recommend outpatient hematology follow-up. Repeat CBC. No evidence of bleeding.  Morbid obesity Body mass index is 45.54 kg/m.   Discharge Diagnoses:  Principal Problem:   MDD (major depressive disorder), recurrent severe, without psychosis ([place]) Active Problems:   Human immunodeficiency virus (HIV) disease ([place])   Alcoholic cirrhosis of liver (HCC)   Acquired pancytopenia ([place])   Hepatic encephalopathy (HCC)   Suicidal ideation   Alcoholism ([place])   Obesity, Class III, BMI 40-49.9 (morbid obesity) ([place])    Discharge Instructions  Discharge Instructions    Increase activity slowly   Complete by:  As directed      Allergies as of 10/05/2017      Reactions   Penicillins    REACTION: hives Has patient had a PCN reaction causing immediate rash, facial/tongue/throat swelling, SOB or lightheadedness with hypotension: Yes Has patient had a PCN reaction causing severe rash involving mucus membranes or skin necrosis: Yes Has patient had a PCN reaction that required hospitalization No Has patient had a PCN reaction occurring within the last 10 years: Yes If all of the above answers are "NO", then may proceed with Cephalosporin use./Per pt makes him feel weird!   Sulfa Antibiotics    Pt states he did not have a reaction last time to Sulfa!      Medication List    STOP taking these medications   ALPRAZolam 1 MG tablet Commonly known as:  XANAX   amitriptyline 100 MG tablet Commonly known as:  ELAVIL   sildenafil 25 MG tablet Commonly known as:  VIAGRA   sodium  phosphate enema     TAKE these medications   AMBULATORY NON FORMULARY MEDICATION Lift Chair- Use as needed Dx: generalized weakness, hepatic encephalopathy   aMILoride 5 MG tablet Commonly known as:  MIDAMOR TAKE 3 TABLETS(15 MG) BY MOUTH DAILY   CONSTULOSE 10 GM/15ML solution Generic drug:  lactulose TAKE 60 ML  BY MOUTH 4 TIMES DAILY   dolutegravir 50 MG tablet Commonly known as:  TIVICAY Take 1 tablet (50 mg total) by mouth daily.   emtricitabine-tenofovir AF 200-25 MG tablet Commonly known as:  DESCOVY Take 1 tablet by mouth daily.   esomeprazole 40 MG capsule Commonly known as:  NEXIUM Take 1 capsule (40 mg total) by mouth daily before breakfast. What changed:  Another medication with the same name was removed. Continue taking this medication, and follow the directions you see here.   furosemide 40 MG tablet Commonly known as:  LASIX TAKE 2 TABLETS(80 MG) BY MOUTH TWICE DAILY   GAS-X PO Take 1-2 tablets by mouth 3 (three) times daily as needed (for flatulence).   potassium chloride SA 20 MEQ tablet Commonly known as:  K-DUR,KLOR-CON Take 2 tablets (40 mEq total) by mouth 2 (two) times daily.   senna 8.6 MG Tabs tablet Commonly known as:  SENOKOT TAKE 2 TABLETS(17.2 MG) BY MOUTH DAILY. START WITH 1 TABLET What changed:  See the new instructions.   triamcinolone cream 0.1 % Commonly known as:  KENALOG APPLY TO AFFECTED AREA DAILY AS NEEDED FOR ITCHING OR RASH   XIFAXAN 550 MG Tabs tablet Generic drug:  rifaximin TAKE ONE TABLET BY MOUTH TWICE DAILY       Allergies  Allergen Reactions  . Penicillins     REACTION: hives Has patient had a PCN reaction causing immediate rash, facial/tongue/throat swelling, SOB or lightheadedness with hypotension: Yes Has patient had a PCN reaction causing severe rash involving mucus membranes or skin necrosis: Yes Has patient had a PCN reaction that required hospitalization No Has patient had a PCN reaction occurring within the last 10 years: Yes If all of the above answers are "NO", then may proceed with Cephalosporin use./Per pt makes him feel weird!   . Sulfa Antibiotics     Pt states he did not have a reaction last time to Sulfa!    Consultations:  Psychiatry   Procedures/Studies: Ct Head Wo Contrast  Result Date:  10/02/2017 CLINICAL DATA:  Altered level of consciousness EXAM: CT HEAD WITHOUT CONTRAST TECHNIQUE: Contiguous axial images were obtained from the base of the skull through the vertex without intravenous contrast. COMPARISON:  04/12/2014 FINDINGS: Brain: No acute intracranial abnormality. Specifically, no hemorrhage, hydrocephalus, mass lesion, acute infarction, or significant intracranial injury. Vascular: No hyperdense vessel or unexpected calcification. Skull: No acute calvarial abnormality. Sinuses/Orbits: Visualized paranasal sinuses and mastoids clear. Orbital soft tissues unremarkable. Other: None IMPRESSION: No acute intracranial abnormality. Electronically Signed   By: Rolm Baptise M.D.   On: 10/02/2017 19:29      Subjective: Patient reports no issues overnight.  Discharge Exam: Vitals:   10/04/17 2032 10/05/17 0650  BP: 122/74 (!) 114/54  Pulse: 79 75  Resp: 18 16  Temp: 98.6 F (37 C) 97.9 F (36.6 C)  SpO2: 93% 94%   Vitals:   10/04/17 1328 10/04/17 2032 10/05/17 0447 10/05/17 0650  BP: 123/75 122/74  (!) 114/54  Pulse: 80 79  75  Resp: 16 18  16   Temp: 98.6 F (37 C) 98.6 F (37 C)  97.9 F (36.6 C)  TempSrc:  Oral Oral  Oral  SpO2: 94% 93%  94%  Weight:   (!) 148.1 kg (326 lb 8 oz)   Height:         General exam: Appears calm and comfortable Respiratory system: Clear to auscultation. Respiratory effort normal. Cardiovascular system: S1 & S2 heard, RRR. No murmurs, rubs, gallops or clicks. Gastrointestinal system: Abdomen is nondistended, soft and nontender. No organomegaly or masses felt. Normal bowel sounds heard. Central nervous system: Alert and oriented. No focal neurological deficits. Extremities: No edema. No calf tenderness Skin: No cyanosis. No rashes Psychiatry: Denies suicidal ideation     The results of significant diagnostics from this hospitalization (including imaging, microbiology, ancillary and laboratory) are listed below for reference.      Microbiology: No results found for this or any previous visit (from the past 240 hour(s)).   Labs: BNP (last 3 results) No results for input(s): BNP in the last 8760 hours. Basic Metabolic Panel: Recent Labs  Lab 10/02/17 1912 10/03/17 0600 10/04/17 0627  NA 142 145 142  K 3.9 3.8 3.9  CL 105 109 104  CO2 25 23 25   GLUCOSE 107* 94 129*  BUN 15 13 10   CREATININE 1.17 1.03 1.07  CALCIUM 9.6 9.2 8.9   Liver Function Tests: Recent Labs  Lab 10/02/17 1912 10/03/17 0600  AST 28 29  ALT 20 19  ALKPHOS 112 101  BILITOT 1.2 1.8*  PROT 6.5 6.2*  ALBUMIN 3.7 3.4*   No results for input(s): LIPASE, AMYLASE in the last 168 hours. Recent Labs  Lab 10/02/17 2030 10/03/17 1003 10/04/17 0627  AMMONIA 94* 68* 36*   CBC: Recent Labs  Lab 10/02/17 1912 10/03/17 0600  WBC 2.4* 2.3*  NEUTROABS 1.4*  --   HGB 11.3* 11.4*  HCT 35.1* 35.3*  MCV 84.0 84.7  PLT 66* 66*   Cardiac Enzymes: No results for input(s): CKTOTAL, CKMB, CKMBINDEX, TROPONINI in the last 168 hours. BNP: Invalid input(s): POCBNP CBG: No results for input(s): GLUCAP in the last 168 hours. D-Dimer No results for input(s): DDIMER in the last 72 hours. Hgb A1c No results for input(s): HGBA1C in the last 72 hours. Lipid Profile No results for input(s): CHOL, HDL, LDLCALC, TRIG, CHOLHDL, LDLDIRECT in the last 72 hours. Thyroid function studies No results for input(s): TSH, T4TOTAL, T3FREE, THYROIDAB in the last 72 hours.  Invalid input(s): FREET3 Anemia work up No results for input(s): VITAMINB12, FOLATE, FERRITIN, TIBC, IRON, RETICCTPCT in the last 72 hours. Urinalysis    Component Value Date/Time   COLORURINE YELLOW 04/15/2015 1712   APPEARANCEUR CLEAR 04/15/2015 1712   LABSPEC 1.010 04/15/2015 1712   PHURINE 7.5 04/15/2015 1712   GLUCOSEU NEGATIVE 04/15/2015 1712   HGBUR NEGATIVE 04/15/2015 1712   BILIRUBINUR NEGATIVE 04/15/2015 1712   KETONESUR NEGATIVE 04/15/2015 1712   PROTEINUR  NEGATIVE 12/02/2014 2140   UROBILINOGEN 1.0 04/15/2015 1712   NITRITE NEGATIVE 04/15/2015 1712   LEUKOCYTESUR NEGATIVE 04/15/2015 1712     SIGNED:   Cordelia Poche, MD [facility] 10/05/2017, 2:11 PM Pager [phone]  If 7PM-7AM, please contact night-coverage [email or web address] Password [email or web address]

## 2017-10-05 NOTE — Progress Notes (Signed)
Pharmacy at [facility] called in regards to pt Lasix 40 IV ordered (incorrect order). Writer notified Sh[place], NP., via telephone to obtain correct order. Writer reordered Lasix 40 mg po daily per NP.

## 2017-10-05 NOTE — Progress Notes (Signed)
Adult Psychoeducational Group Note  Date:  10/05/2017 Time:  10:20 PM  Group Topic/Focus:  Wrap-Up Group:   The focus of this group is to help patients review their daily goal of treatment and discuss progress on daily workbooks.  Participation Level:  Active  Participation Quality:  Appropriate  Affect:  Appropriate  Cognitive:  Appropriate  Insight: Appropriate  Engagement in Group:  Engaged  Modes of Intervention:  Discussion  Additional Comments:  Patient attended group and said that his day was a 5. His coping skills for today was sleeping and family support.   Tomekia Helton W Jasmina Gendron 02/05/538, 10:20 PM

## 2017-10-05 NOTE — Progress Notes (Signed)
[name] is a 52 year old male being admitted voluntarily to 402-2 from [facility] floor. He was admitted medically after intention OD on xanax due to the passing of his mother 2 weeks ago.  During [facility] admission, he was very drowsy but was able to answer questions appropriately.  He denies current SI and will contract for safety on the unit.  He denies HI or A/V hallucinations.  He states that he is remorseful about his attempt and wants to be around to help his father because "he has done so much for me in the past."  He has history of multiple health issues.  He denies any pain or discomfort and appears to be in physical distress.  He reported that he used to be an alcoholic in the past and has relapsed once since his mother's passing.  Oriented him to the unit.  Admission paperwork completed and signed.  Belongings searched and no locker needed on admission, no contraband found.  Skin assessment completed and no new skin issues noted-does have scaring on his right lower leg from infection a few months back.  Q 15 minute checks initiated for safety.  We will continue to monitor the progress towards his goals.

## 2017-10-05 NOTE — Progress Notes (Addendum)
LCSW following for inpatient psych placement.  Patient has bed at [facility]. Room 402 bed 2.  Attending: Mariea Clonts Accepting: [place]  Patient is voluntary and will transport by B[facility]y Pries.  Patient can transport at [place].   RN report #: [phone] Please call report prior to patient leaving.   Carolin Coy [place]

## 2017-10-05 NOTE — Progress Notes (Signed)
Chaplain providing support around suicidal ideation, grief around death of mother, inpatient admission.  Introduced spiritual care as resource and provided support at bedside.  PT is being transferred to [facility] 400 hall.  Will continue to follow for support following admission to [facility].    [facility] Chaplain Jerene Pitch, MDiv, [facility]

## 2017-10-05 NOTE — Tx Team (Signed)
Initial Treatment Plan 10/05/2017 5:41 PM [name] [id number]:[id number]    PATIENT STRESSORS: Health problems Loss of Mother died   PATIENT STRENGTHS: Capable of independent living Communication skills General fund of knowledge   PATIENT IDENTIFIED PROBLEMS: Depression  Suicidal ideation  "Get back to where I was before"                 DISCHARGE CRITERIA:  Improved stabilization in mood, thinking, and/or behavior Motivation to continue treatment in a less acute level of care Verbal commitment to aftercare and medication compliance  PRELIMINARY DISCHARGE PLAN: Outpatient therapy Medication management   PATIENT/FAMILY INVOLVEMENT: This treatment plan has been presented to and reviewed with the patient, [name].  The patient and family have been given the opportunity to ask questions and make suggestions.  Windell Moment, RN 10/05/2017, 5:41 PM

## 2017-10-05 NOTE — Progress Notes (Signed)
Report called to [facility] at [facility].

## 2017-10-05 NOTE — Progress Notes (Signed)
Writer introduced self to pt. Pt stated that he has not had supper. Writer provided pt with a sandwich tray and beverage. Pt noted to be drowsy on approach while sitting. Per admit RN report, pt admitted to the unit drowsy. Per report from [facility] pt drowsy due to hepatic encephalopathy. Pt informed of EKG order. Pt will notify RN., when he's ready to have EKG performed.

## 2017-10-06 ENCOUNTER — Telehealth: Payer: Self-pay

## 2017-10-06 MED ORDER — LORAZEPAM 0.5 MG PO TABS
0.5000 mg | ORAL_TABLET | Freq: Four times a day (QID) | ORAL | Status: DC | PRN
  Administered 2017-10-06: 0.5 mg via ORAL
  Filled 2017-10-06: qty 1

## 2017-10-06 MED ORDER — BENZOCAINE 10 % MT GEL
Freq: Two times a day (BID) | OROMUCOSAL | Status: DC | PRN
  Administered 2017-10-06: 21:00:00 via OROMUCOSAL
  Filled 2017-10-06: qty 9.4

## 2017-10-06 MED ORDER — AMITRIPTYLINE HCL 75 MG PO TABS
75.0000 mg | ORAL_TABLET | Freq: Every day | ORAL | Status: DC
  Administered 2017-10-06 – 2017-10-08 (×3): 75 mg via ORAL
  Filled 2017-10-06 (×3): qty 1
  Filled 2017-10-06: qty 3
  Filled 2017-10-06 (×2): qty 1

## 2017-10-06 MED ORDER — HYDROXYZINE HCL 25 MG PO TABS
25.0000 mg | ORAL_TABLET | ORAL | Status: DC | PRN
  Administered 2017-10-06: 25 mg via ORAL
  Filled 2017-10-06: qty 1

## 2017-10-06 MED ORDER — LACTULOSE 10 GM/15ML PO SOLN
10.0000 g | Freq: Three times a day (TID) | ORAL | Status: DC
  Administered 2017-10-06 – 2017-10-07 (×4): 10 g via ORAL
  Filled 2017-10-06 (×8): qty 15

## 2017-10-06 MED ORDER — IBUPROFEN 400 MG PO TABS
400.0000 mg | ORAL_TABLET | Freq: Four times a day (QID) | ORAL | Status: DC | PRN
  Administered 2017-10-16 – 2017-10-19 (×2): 400 mg via ORAL
  Filled 2017-10-06 (×2): qty 1

## 2017-10-06 NOTE — Progress Notes (Signed)
Adult Psychoeducational Group Note  Date:  10/06/2017 Time:  8:54 PM  Group Topic/Focus:  Wrap-Up Group:   The focus of this group is to help patients review their daily goal of treatment and discuss progress on daily workbooks.  Participation Level:  Active  Participation Quality:  Appropriate  Affect:  Appropriate  Cognitive:  Alert and Oriented  Insight: Good  Engagement in Group:  Engaged  Modes of Intervention:  Discussion  Additional Comments:  Pt attended group this evening and rated his day a 6/10.  [name] 10/06/2017, 8:54 PM
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
## 2017-10-06 NOTE — [facility] Group Notes (Signed)
[facility] LCSW Group Therapy Note  Date/Time: 10/06/17, 1315  Type of Therapy/Topic:  Group Therapy:  Balance in Life  Participation Level:  Did not attend  Description of Group:    This group will address the concept of balance and how it feels and looks when one is unbalanced. Patients will be encouraged to process areas in their lives that are out of balance, and identify reasons for remaining unbalanced. Facilitators will guide patients utilizing problem- solving interventions to address and correct the stressor making their life unbalanced. Understanding and applying boundaries will be explored and addressed for obtaining  and maintaining a balanced life. Patients will be encouraged to explore ways to assertively make their unbalanced needs known to significant others in their lives, using other group members and facilitator for support and feedback.  Therapeutic Goals: 1. Patient will identify two or more emotions or situations they have that consume much of in their lives. 2. Patient will identify signs/triggers that life has become out of balance:  3. Patient will identify two ways to set boundaries in order to achieve balance in their lives:  4. Patient will demonstrate ability to communicate their needs through discussion and/or role plays  Summary of Patient Progress:          Therapeutic Modalities:   Cognitive Behavioral Therapy Solution-Focused Therapy Assertiveness Training  Lurline Idol, LCSW
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
## 2017-10-06 NOTE — Telephone Encounter (Signed)
erro  neous encounter

## 2017-10-06 NOTE — BHH Suicide Risk Assessment (Signed)
[facility] Admission Suicide Risk Assessment   Nursing information obtained from:  Patient Demographic factors:  Male, [name], lesbian, or bisexual orientation, Unemployed Current Mental Status:  NA Loss Factors:  Loss of significant relationship, Decline in physical health Historical Factors:  NA Risk Reduction Factors:  Living with another person, especially a relative, Sense of responsibility to family  Total Time spent with patient: 1 hour Principal Problem: <principal problem not specified> Diagnosis:   Patient Active Problem List   Diagnosis Date Noted  . MDD (major depressive disorder), recurrent severe, without psychosis ([place]) [F33.2]   . Alcoholism ([place]) [F10.20] 10/03/2017  . Obesity, Class III, BMI 40-49.9 (morbid obesity) ([place]) [E66.01] 10/03/2017  . Hepatic encephalopathy ([place]) [K72.90] 10/02/2017  . Suicidal ideation [R45.851]   . Secondary esophageal varices with bleeding (HCC) [I85.11]   . Venous stasis [I87.8] 03/02/2016  . Secondary esophageal varices without bleeding (HCC) [I85.10]   . Erectile dysfunction [N52.9] 07/14/2015  . Encephalopathy, hepatic ([place]) [K72.90] 01/23/2015  . Peripheral edema [R60.9] 12/31/2014  . Constipation [K59.00] 07/15/2014  . Fall [W19.XXXA] 01/07/2014  . Orthostatic hypotension [I95.1] 05/28/2013  . Personal history of failed moderate sedation- MUST HAVE MAC OR GENERAL [Z92.83] 05/01/2013  . Portal hypertensive gastropathy ([place]) [K76.6, K31.89] 01/02/2013  . Acquired pancytopenia ([place]) [Z16.967] 05/19/2011  . Insomnia [G47.00] 12/21/2010  . Alcoholic cirrhosis of liver ([place]) [K70.30] 07/15/2010  . Idiopathic peripheral autonomic neuropathy [G90.09] 05/25/2007  . Idiopathic peripheral autonomic neuropathy, unspecified [G90.09] 05/25/2007  . SINUSITIS, CHRONIC MAXILLARY [J32.0] 03/06/2007  . Human immunodeficiency virus (HIV) disease ([place]) [B20] 06/23/2006  . HYPERLIPIDEMIA [E78.5] 06/23/2006  . Anxiety state [F41.1] 06/23/2006  . Depression  [F32.9] 06/23/2006  . HYPERTENSION [I10] 06/23/2006  . Reflux esophagitis [K21.0] 06/23/2006   Subjective Data: Patient is seen and examined.  Patient is a 52 year old male with a past psychiatric history significant for alcohol dependence and a past medical history significant for hepatic cirrhosis as well as HIV positive who originally presented to the [facility] emergency department on 10/02/2017 after an intentional overdose.  Patient stated that his mother had died on Sep 20, 2022.  He was quite depressed after that.  He had been her caretaker.  He stated that her death was unexpected.  He had been prescribed 60 Xanax tablets on 09/29/2022.  After her death his alcohol intake increased.  He took an overdose of the Xanax, and then felt guilty and bad about it and notified his family members.  He was transported to the hospital for evaluation.  He admitted this was a suicide attempt.  Also in the emergency room his ammonia was elevated at 94.  He was quite somnolent and admitted.  He was seen by the consult psychiatric service at [facility].  It was decided that he could be transferred to the psychiatric hospital once his medical situation was stabilized.  He was transferred to our facility on 4/24.  This morning he stated he is not currently suicidal.  He does not feel well physically.  He has not been receiving his lactulose.  His last ammonia was 36.  He admitted that he had been drinking daily for several months.  He denied any previous seizures or complicated withdrawal syndromes.  He stated that his last viral load was "a couple of points above nondetectable".  He admitted to helplessness, hopelessness and worthlessness.  He stated he had been treated for depression in the past by his primary physicians.  He was unable to tolerate many antidepressants.  This included  Paxil, Prozac, Zoloft, Wellbutrin and several others including Effexor.  He currently denied any acute suicidal thoughts.  Continued Clinical  Symptoms:  Alcohol Use Disorder Identification Test Final Score (AUDIT): 0 The "Alcohol Use Disorders Identification Test", Guidelines for Use in Primary Care, Second Edition.  World Pharmacologist [facility]). Score between 0-7:  no or low risk or alcohol related problems. Score between 8-15:  moderate risk of alcohol related problems. Score between 16-19:  high risk of alcohol related problems. Score 20 or above:  warrants further diagnostic evaluation for alcohol dependence and treatment.   CLINICAL FACTORS:   Alcohol/Substance Abuse/Dependencies Previous Psychiatric Diagnoses and Treatments   Musculoskeletal: Strength & Muscle Tone: within normal limits Gait & Station: unsteady Patient leans: N/A  Psychiatric Specialty Exam: Physical Exam  Nursing note and vitals reviewed. Constitutional: He is oriented to person, place, and time. He appears well-developed and well-nourished.  HENT:  Head: Normocephalic and atraumatic.  Respiratory: Effort normal.  Musculoskeletal: He exhibits tenderness.  Neurological: He is alert and oriented to person, place, and time.    ROS  Blood pressure 123/79, pulse 96, temperature 97.9 F (36.6 C), temperature source Oral, resp. rate 18, height _0  (1.803 m), weight (!) 147.9 kg (326 lb).Body mass index is 45.47 kg/m.  General Appearance: Disheveled  Eye Contact:  Minimal  Speech:  Normal Rate  Volume:  Decreased  Mood:  Depressed  Affect:  Congruent  Thought Process:  Coherent  Orientation:  Full (Time, Place, and Person)  Thought Content:  Logical  Suicidal Thoughts:  No  Homicidal Thoughts:  No  Memory:  Immediate;   Fair  Judgement:  Impaired  Insight:  Lacking  Psychomotor Activity:  Decreased  Concentration:  Concentration: Fair  Recall:  [employer] of Knowledge:  Fair  Language:  Fair  Akathisia:  Negative  Handed:  Right  AIMS (if indicated):     Assets:  Desire for Improvement Housing Resilience Social Support   ADL's:  Intact  Cognition:  WNL  Sleep:  Number of Hours: 6.75      COGNITIVE FEATURES THAT CONTRIBUTE TO RISK:  None    SUICIDE RISK:   Mild:  Suicidal ideation of limited frequency, intensity, duration, and specificity.  There are no identifiable plans, no associated intent, mild dysphoria and related symptoms, good self-control (both objective and subjective assessment), few other risk factors, and identifiable protective factors, including available and accessible social support.  PLAN OF CARE: Patient is seen and examined.  Patient is a 52 year old male with a past psychiatric history significant for alcohol use disorder, probable depression and multiple medical problems including HIV positivity as well as hepatic cirrhosis.  He was transferred to our facility after an intentional overdose.  He will be admitted to the inpatient unit.  He will be integrated into the milieu.  He will be encouraged to attend groups.  He will be monitored every 15 minutes for suicidal thoughts as well as alcohol withdrawal syndromes.  I have reviewed multiple medications for depression with the patient, and he is not really willing to try treatment again.  He was taking amitriptyline prior to the overdose, but I am really uncomfortable giving someone who was taken an intentional overdose already of putting medication which can be lethal in overdose.  My mind may change during the course of the hospitalization if I become more confident with his mental status.  I certify that inpatient services furnished can reasonably be expected to improve the patient's condition.  Sharma Covert, MD 10/06/2017, 10:42 AM

## 2017-10-06 NOTE — Progress Notes (Signed)
Pt presents with a flat affect and depressed mood. Pt rates depression 6/10. Pt denies SI/HI. Pt expressed feeling depressed today but remains hopeful. Pt expressed that his mother died about two weeks ago from a medication she was taking for a UTI.   Pt noted to be drowsy on approach. Pt is A&O. Pt gait slow and shuffled.  Pt expressed ongoing. generalized weakness since being in the hosp at [facility]. Pt verbalized that he hasn't slept in five days. Dr. Mallie Darting, made aware in progression meeting of pt's status.   Orders reviewed with pt. Verbal support provided. Pt encouraged to attend groups. PT consult ordered by MD. 15 minute checks performed for safety.

## 2017-10-06 NOTE — Progress Notes (Signed)
Adult Psychoeducational Group Note  Date:  10/06/2017 Time:  4:33 PM  Group Topic/Focus:  Personal develpment time  Participation Level:  Active  Participation Quality:  Appropriate  Affect:  Appropriate  Cognitive:  Appropriate  Insight: Appropriate  Engagement in Group:  Engaged  Modes of Intervention:  Education  Additional Comments:    Wandalee Klang L 10/06/2017, 4:33 PM

## 2017-10-06 NOTE — Progress Notes (Signed)
Patient ID: [name], male   DOB: Feb 06, 1966, 52 y.o.   MRN: [id number] DAR Note: Pt observed pacing very slowly on the unit. Pt presents with a sad/flat affect. Pt at the time of assessment denied pain however; endorsed moderated depression and anxiety; "I am just not too happy but I'm getting there." Pt denies SI/HI or AVH. Pt was med compliant. All patient's questions and concerns addressed. Support, encouragement, and safe environment provided. 15-minute safety checks continue. Pt attended wrap-up group.

## 2017-10-06 NOTE — BHH Counselor (Signed)
Adult Comprehensive Assessment  Patient ID: [name], male   DOB: 26-Aug-1965, 52 y.o.   MRN: [id number]  Information Source:    Current Stressors:     Living/Environment/Situation:  Living Arrangements: Parent  Family History:     Childhood History:     Education:     Employment/Work Situation:   Employment situation: On disability Why is patient on disability: liver disease How long has patient been on disability: 10 years Patient's job has been impacted by current illness: (na) What is the longest time patient has a held a job?: 8 years Where was the patient employed at that time?: [employer] Has patient ever been in the [employer]?: No Are There Guns or Other Weapons in [place]?: Yes Types of Guns/Weapons: 4-5 rifles Are These Psychologist, educational?: No Who Could Verify You Are Able To Have These Secured:: father  Pensions consultant:   Museum/gallery curator resources: [employer], Support from parents / caregiver, Physicist, medical, Medicaid Does patient have a Programmer, applications or guardian?: No  Alcohol/Substance Abuse:   What has been your use of drugs/alcohol within the last 12 months?: pt denies all If attempted suicide, did drugs/alcohol play a role in this?: No Alcohol/Substance Abuse Treatment Hx: Attends AA/NA, Past Tx, Outpatient Has alcohol/substance abuse ever caused legal problems?: No  Social Support System:   Pensions consultant Support System: Manufacturing engineer System: friend, father, family Type of faith/religion: pentecostal How does patient's faith help to cope with current illness?: I believe in the power of prayer  Leisure/Recreation:   Leisure and Hobbies: movies, out to eat  Strengths/Needs:   What things does the patient do well?: music In what areas does patient struggle / problems for patient: loss of mother  Discharge Plan:   Does patient have access to transportation?: Yes Will patient be returning to same living  situation after discharge?: Yes Currently receiving community mental health services: No If no, would patient like referral for services when discharged?: Yes (What county?)([facility]) Does patient have financial barriers related to discharge medications?: No  Summary/Recommendations:   Summary and Recommendations (to be completed by the evaluator): Pt is 52 year old male from [place]. [facility])  Pt is diagnosed with major depressive disorder and was admitted after an intentional overdose/suicide attempt.  Pt reports his mother died two weeks ago and that this is his only current stressor.  Recommendations for pt include crisis stabilization, therapeutic mileu, attend and participate in groups, medication management, and development of comprehensive mental wellness plan.  [name]. 10/06/2017

## 2017-10-06 NOTE — Progress Notes (Signed)
  DATA ACTION RESPONSE  Objective- Pt. is visible in the dayroom, seen eating a snack.Presents with an animated/anxious affect and mood.  Subjective- Denies having any SI/HI/AVH/Pain at this time.Pt. states he is going to spend more time with family after d/c. C/o of mouth sore this evening. Is cooperative and remains safe on the unit.  1:1 interaction in private to establish rapport. Encouragement, education, & support given from staff.  PRN oragel and vistaril requested and will re-eval accordingly.   Safety maintained with Q 15 checks. Continue with POC.

## 2017-10-07 LAB — HEPATIC FUNCTION PANEL
ALK PHOS: 117 U/L (ref 38–126)
ALT: 22 U/L (ref 17–63)
AST: 32 U/L (ref 15–41)
Albumin: 3.9 g/dL (ref 3.5–5.0)
BILIRUBIN DIRECT: 0.4 mg/dL (ref 0.1–0.5)
Indirect Bilirubin: 1.2 mg/dL — ABNORMAL HIGH (ref 0.3–0.9)
Total Bilirubin: 1.6 mg/dL — ABNORMAL HIGH (ref 0.3–1.2)
Total Protein: 7.1 g/dL (ref 6.5–8.1)

## 2017-10-07 LAB — AMMONIA: AMMONIA: 76 umol/L — AB (ref 9–35)

## 2017-10-07 MED ORDER — NICOTINE POLACRILEX 2 MG MT GUM
2.0000 mg | CHEWING_GUM | OROMUCOSAL | Status: DC | PRN
  Administered 2017-10-07 – 2017-10-20 (×25): 2 mg via ORAL
  Filled 2017-10-07: qty 10
  Filled 2017-10-07 (×11): qty 1

## 2017-10-07 MED ORDER — LACTULOSE 10 GM/15ML PO SOLN
20.0000 g | Freq: Three times a day (TID) | ORAL | Status: DC
  Administered 2017-10-07 – 2017-10-21 (×42): 20 g via ORAL
  Filled 2017-10-07 (×49): qty 30

## 2017-10-07 MED ORDER — LORAZEPAM 0.5 MG PO TABS
0.5000 mg | ORAL_TABLET | Freq: Three times a day (TID) | ORAL | Status: DC | PRN
  Administered 2017-10-07 – 2017-10-08 (×3): 0.5 mg via ORAL
  Filled 2017-10-07 (×3): qty 1

## 2017-10-07 NOTE — Tx Team (Signed)
Interdisciplinary Treatment and Diagnostic Plan Update  10/07/2017 Time of Session: [place] MRN: [id number]  Principal Diagnosis: <principal problem not specified>  Secondary Diagnoses: Active Problems:   MDD (major depressive disorder), recurrent severe, without psychosis ([place])   Current Medications:  Current Facility-Administered Medications  Medication Dose Route Frequency Provider Last Rate Last Dose  . aMILoride (MIDAMOR) tablet 15 mg  15 mg Oral Daily Rankin, Shuvon B, NP   15 mg at 10/07/17 0801  . amitriptyline (ELAVIL) tablet 75 mg  75 mg Oral QHS Sharma Covert, MD   75 mg at 10/06/17 2126  . benzocaine (ORAJEL) 10 % mucosal gel   Mouth/Throat BID PRN Lindon Romp A, NP      . dolutegravir (TIVICAY) tablet 50 mg  50 mg Oral Daily Rankin, Shuvon B, NP   50 mg at 10/07/17 0800  . emtricitabine-tenofovir AF (DESCOVY) 200-25 MG per tablet 1 tablet  1 tablet Oral Daily Rankin, Shuvon B, NP   1 tablet at 10/07/17 0801  . furosemide (LASIX) tablet 40 mg  40 mg Oral Daily Rankin, Shuvon B, NP   40 mg at 10/07/17 0801  . hydrOXYzine (ATARAX/VISTARIL) tablet 25 mg  25 mg Oral Q4H PRN Sharma Covert, MD   25 mg at 10/06/17 2126  . ibuprofen (ADVIL,MOTRIN) tablet 400 mg  400 mg Oral Q6H PRN Sharma Covert, MD      . lactulose ([place]) 10 GM/15ML solution 10 g  10 g Oral TID Sharma Covert, MD   10 g at 10/07/17 0803  . LORazepam (ATIVAN) tablet 0.5 mg  0.5 mg Oral Q6H PRN Sharma Covert, MD   0.5 mg at 10/06/17 1730  . pantoprazole (PROTONIX) EC tablet 40 mg  40 mg Oral Daily Rankin, Shuvon B, NP   40 mg at 10/07/17 0801  . potassium chloride SA (K-DUR,KLOR-CON) CR tablet 40 mEq  40 mEq Oral BID Rankin, Shuvon B, NP   40 mEq at 10/07/17 0800  . rifaximin (XIFAXAN) tablet 550 mg  550 mg Oral BID Rankin, Shuvon B, NP   550 mg at 10/07/17 0800  . simethicone (MYLICON) chewable tablet 80 mg  80 mg Oral TID PRN Rankin, Shuvon B, NP       PTA  Medications: Medications Prior to Admission  Medication Sig Dispense Refill Last Dose  . AMBULATORY NON FORMULARY MEDICATION Lift Chair- Use as needed Dx: generalized weakness, hepatic encephalopathy 1 each 0 Past Month at Unknown time  . aMILoride (MIDAMOR) 5 MG tablet TAKE 3 TABLETS(15 MG) BY MOUTH DAILY 90 tablet 11 10/01/2017 at Unknown time  . CONSTULOSE 10 GM/15ML solution TAKE 60 ML BY MOUTH 4 TIMES DAILY 3792 mL 5 10/01/2017 at Unknown time  . dolutegravir (TIVICAY) 50 MG tablet Take 1 tablet (50 mg total) by mouth daily. 30 tablet 11 10/01/2017 at noon  . emtricitabine-tenofovir AF (DESCOVY) 200-25 MG tablet Take 1 tablet by mouth daily. 30 tablet 11 10/01/2017 at noon  . esomeprazole (NEXIUM) 40 MG capsule Take 1 capsule (40 mg total) by mouth daily before breakfast. 30 capsule 0 Past Month at Unknown time  . furosemide (LASIX) 40 MG tablet TAKE 2 TABLETS(80 MG) BY MOUTH TWICE DAILY 120 tablet 11 10/01/2017 at Unknown time  . potassium chloride SA (K-DUR,KLOR-CON) 20 MEQ tablet Take 2 tablets (40 mEq total) by mouth 2 (two) times daily. 120 tablet 11 10/01/2017 at Unknown time  . senna (SENOKOT) 8.6 MG TABS tablet TAKE 2 TABLETS(17.2 MG) BY  MOUTH DAILY. START WITH 1 TABLET (Patient taking differently: 1 t po qd) 60 tablet 0 10/01/2017 at Unknown time  . Simethicone (GAS-X PO) Take 1-2 tablets by mouth 3 (three) times daily as needed (for flatulence).    Past Month at Unknown time  . triamcinolone cream (KENALOG) 0.1 % APPLY TO AFFECTED AREA DAILY AS NEEDED FOR ITCHING OR RASH  1 Past Month at Unknown time  . XIFAXAN 550 MG TABS tablet TAKE ONE TABLET BY MOUTH TWICE DAILY 60 tablet 11 10/01/2017 at Unknown time    Patient Stressors: Health problems Loss of Mother died  Patient Strengths: Capable of independent living Curator fund of knowledge  Treatment Modalities: Medication Management, Group therapy, Case management,  1 to 1 session with clinician, Psychoeducation,  Recreational therapy.   Physician Treatment Plan for Primary Diagnosis: <principal problem not specified> Long Term Goal(s): Improvement in symptoms so as ready for discharge Improvement in symptoms so as ready for discharge   Short Term Goals: Ability to identify changes in lifestyle to reduce recurrence of condition will improve Ability to verbalize feelings will improve Ability to disclose and discuss suicidal ideas Ability to demonstrate self-control will improve Ability to identify and develop effective coping behaviors will improve Ability to maintain clinical measurements within normal limits will improve Compliance with prescribed medications will improve Ability to identify triggers associated with substance abuse/mental health issues will improve Ability to identify changes in lifestyle to reduce recurrence of condition will improve Ability to verbalize feelings will improve Ability to disclose and discuss suicidal ideas Ability to demonstrate self-control will improve Ability to identify and develop effective coping behaviors will improve Ability to maintain clinical measurements within normal limits will improve Compliance with prescribed medications will improve Ability to identify triggers associated with substance abuse/mental health issues will improve  Medication Management: Evaluate patient's response, side effects, and tolerance of medication regimen.  Therapeutic Interventions: 1 to 1 sessions, Unit Group sessions and Medication administration.  Evaluation of Outcomes: Progressing  Physician Treatment Plan for Secondary Diagnosis: Active Problems:   MDD (major depressive disorder), recurrent severe, without psychosis ([place])  Long Term Goal(s): Improvement in symptoms so as ready for discharge Improvement in symptoms so as ready for discharge   Short Term Goals: Ability to identify changes in lifestyle to reduce recurrence of condition will improve Ability to  verbalize feelings will improve Ability to disclose and discuss suicidal ideas Ability to demonstrate self-control will improve Ability to identify and develop effective coping behaviors will improve Ability to maintain clinical measurements within normal limits will improve Compliance with prescribed medications will improve Ability to identify triggers associated with substance abuse/mental health issues will improve Ability to identify changes in lifestyle to reduce recurrence of condition will improve Ability to verbalize feelings will improve Ability to disclose and discuss suicidal ideas Ability to demonstrate self-control will improve Ability to identify and develop effective coping behaviors will improve Ability to maintain clinical measurements within normal limits will improve Compliance with prescribed medications will improve Ability to identify triggers associated with substance abuse/mental health issues will improve     Medication Management: Evaluate patient's response, side effects, and tolerance of medication regimen.  Therapeutic Interventions: 1 to 1 sessions, Unit Group sessions and Medication administration.  Evaluation of Outcomes: Progressing   RN Treatment Plan for Primary Diagnosis: <principal problem not specified> Long Term Goal(s): Knowledge of disease and therapeutic regimen to maintain health will improve  Short Term Goals: Ability to identify and develop effective coping  behaviors will improve and Compliance with prescribed medications will improve  Medication Management: RN will administer medications as ordered by provider, will assess and evaluate patient's response and provide education to patient for prescribed medication. RN will report any adverse and/or side effects to prescribing provider.  Therapeutic Interventions: 1 on 1 counseling sessions, Psychoeducation, Medication administration, Evaluate responses to treatment, Monitor vital signs and  CBGs as ordered, Perform/monitor CIWA, COWS, AIMS and Fall Risk screenings as ordered, Perform wound care treatments as ordered.  Evaluation of Outcomes: Progressing   LCSW Treatment Plan for Primary Diagnosis: <principal problem not specified> Long Term Goal(s): Safe transition to appropriate next level of care at discharge, Engage patient in therapeutic group addressing interpersonal concerns.  Short Term Goals: Engage patient in aftercare planning with referrals and resources, Increase social support and Increase skills for wellness and recovery  Therapeutic Interventions: Assess for all discharge needs, 1 to 1 time with Social worker, Explore available resources and support systems, Assess for adequacy in community support network, Educate family and significant other(s) on suicide prevention, Complete Psychosocial Assessment, Interpersonal group therapy.  Evaluation of Outcomes: Progressing   Progress in Treatment: Attending groups: No. Participating in groups: No. Taking medication as prescribed: Yes. Toleration medication: Yes. Family/Significant other contact made: No, will contact:  aunt Patient understands diagnosis: Yes. Discussing patient identified problems/goals with staff: Yes. Medical problems stabilized or resolved: Yes. Denies suicidal/homicidal ideation: Yes. Issues/concerns per patient self-inventory: No. Other: none  New problem(s) identified: No, Describe:  none  New Short Term/Long Term Goal(s):Pt goal: "to get clean, detox from xanax"  Discharge Plan or Barriers:   Reason for Continuation of Hospitalization: Depression Medication stabilization Withdrawal symptoms  Estimated Length of Stay: 2-4 days.  Attendees: Patient:[name] 10/07/2017   Physician: Dr Mallie Darting, MD 10/07/2017   Nursing: Lynnda Shields, RN 10/07/2017   RN Care Manager: 10/07/2017   Social Worker: Lurline Idol, LCSW 10/07/2017   Recreational Therapist:  10/07/2017   Other:  10/07/2017    Other:  10/07/2017   Other: 10/07/2017     Scribe for Treatment Team: [name], LCSW 10/07/2017 10:16 AM

## 2017-10-07 NOTE — [facility] Suicide Risk Assessment (Signed)
[facility] INPATIENT:  Family/Significant Other Suicide Prevention Education  Suicide Prevention Education:  Education Completed; [name], aunt, [phone], has been identified by the patient as the family member/significant other with whom the patient will be residing, and identified as the person(s) who will aid the patient in the event of a mental health crisis (suicidal ideations/suicide attempt).  With written consent from the patient, the family member/significant other has been provided the following suicide prevention education, prior to the and/or following the discharge of the patient.  The suicide prevention education provided includes the following:  Suicide risk factors  Suicide prevention and interventions  National Suicide Hotline telephone number  [facility] assessment telephone number  [facility] Emergency Assistance [place] and/or Residential Mobile Crisis Unit telephone number  Request Carl of family/significant other to:  Remove weapons (e.g., guns, rifles, knives), all items previously/currently identified as safety concern.  [name] is not sure about guns in the home.  Remove drugs/medications (over-the-counter, prescriptions, illicit drugs), all items previously/currently identified as a safety concern.  The family member/significant other verbalizes understanding of the suicide prevention education information provided.  The family member/significant other agrees to remove the items of safety concern listed above.   [name] reports that pt's mother was his caregiver--Carl sure he took his [name], Carl sure he used the bathroom.  Pt has been overwhelmed since she passed.  Pt also told [name] that his cousin called him just prior to his overdose and told him that it was his fault that his mother died because she had to worry about him all the time.  Pt's brother lives within walking distance from pt and will take over a lot of the stuff the  mother used to do.  Pt father is supportive but also getting older.  Carl Chars, LCSW 10/07/2017, 12:54 PM

## 2017-10-07 NOTE — Progress Notes (Signed)
D: Patient alert and oriented. Affect/mood: Flat in affect, though brightens during approach and interaction. Denies SI, HI, AVH at this time. Denies pain. Denies any discomfort to mouth today and did not request any oragel for mouth pain. Patient did receive PRN nicorette gum throughout the day as needed. Patient also received PRN ativan x1 today for complaints of anxiety without specific trigger. Goal: "to hope for better days". Patient has been observed present and interacting with peers in the dayroom, as well as present and engaged in groups on the unit. Patient reports "fair" sleep, "good" appetite and concentration per patient self inventory sheet. Patient rates feelings of depression "6", and anxiety "9". Patient does endorse that he has had feelings of harming himself today, though maintains that he can remain safe on the unit and denies SI at this time. Patient reported feelings of lightheadedness per inventory sheet though denies when asked.   A: Scheduled medications administered to patient per MD order. Support and encouragement provided. Routine safety checks conducted every 15 minutes. Patient informed to notify staff with problems or concerns. Encouraged to notify if overwhelming feelings of harm toward self or others arise. Patient agrees.  R: No adverse drug reactions noted. Patient contracts for safety at this time. Patient compliant with medications and treatment plan. Patient receptive and cooperative, though appears anxious at times. Patient interacts well with others on the unit. Patient remains safe at this time. Will continue to monitor.

## 2017-10-07 NOTE — Progress Notes (Signed)
[facility] MD Progress Note  10/07/2017 10:54 AM [name]  MRN:  [id number] Subjective: Patient is seen and examined.  Patient is a 52 year old male with a past psychiatric history significant for major depression as well as alcohol use disorder.  He has a past medical history significant for HIV as well as hepatic cirrhosis.  He seen in follow-up.  He said he feels better today.  He slept well last night with the reinstitution of the amitriptyline.  Unfortunately his ammonia was going up.  It was 76 this a.m.  His liver function enzymes were normal.  He denied any suicidal ideation today.  He stated he is having a good time meeting with patient's, playing cards, and going to the groups.  No gross encephalopathy.  No asterixis.  He does have some mild tremor.  He was seen in treatment team as well this a.m., and we made it clear that when he was discharged to be weaned completely off benzodiazepines. Principal Problem: <principal problem not specified> Diagnosis:   Patient Active Problem List   Diagnosis Date Noted  . MDD (major depressive disorder), recurrent severe, without psychosis ([place]) [F33.2]   . Alcoholism ([place]) [F10.20] 10/03/2017  . Obesity, Class III, BMI 40-49.9 (morbid obesity) ([place]) [E66.01] 10/03/2017  . Hepatic encephalopathy ([place]) [K72.90] 10/02/2017  . Suicidal ideation [R45.851]   . Secondary esophageal varices with bleeding (HCC) [I85.11]   . Venous stasis [I87.8] 03/02/2016  . Secondary esophageal varices without bleeding (HCC) [I85.10]   . Erectile dysfunction [N52.9] 07/14/2015  . Encephalopathy, hepatic ([place]) [K72.90] 01/23/2015  . Peripheral edema [R60.9] 12/31/2014  . Constipation [K59.00] 07/15/2014  . Fall [W19.XXXA] 01/07/2014  . Orthostatic hypotension [I95.1] 05/28/2013  . Personal history of failed moderate sedation- MUST HAVE MAC OR GENERAL [Z92.83] 05/01/2013  . Portal hypertensive gastropathy ([place]) [K76.6, K31.89] 01/02/2013  . Acquired pancytopenia ([place])  [D17.616] 05/19/2011  . Insomnia [G47.00] 12/21/2010  . Alcoholic cirrhosis of liver ([place]) [K70.30] 07/15/2010  . Idiopathic peripheral autonomic neuropathy [G90.09] 05/25/2007  . Idiopathic peripheral autonomic neuropathy, unspecified [G90.09] 05/25/2007  . SINUSITIS, CHRONIC MAXILLARY [J32.0] 03/06/2007  . Human immunodeficiency virus (HIV) disease ([place]) [B20] 06/23/2006  . HYPERLIPIDEMIA [E78.5] 06/23/2006  . Anxiety state [F41.1] 06/23/2006  . Depression [F32.9] 06/23/2006  . HYPERTENSION [I10] 06/23/2006  . Reflux esophagitis [K21.0] 06/23/2006   Total Time spent with patient: 20 minutes  Past Psychiatric History: See admission H&P  Past Medical History:  Past Medical History:  Diagnosis Date  . Alcoholic cirrhosis of liver ([place])   . Alcoholism, chronic ([place])   . ANXIETY 06/23/2006  . Ascites 11/13/2009  . Ascites   . Avulsion fracture of middle phalanges of  3rd/4th fingers left hand 01/07/2014  . Blood dyscrasia    HIV  . Cellulitis of right lower extremity 03/02/2016  . DEPRESSION 06/23/2006  . 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 Erectile dysfunction 07/14/2015  . 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 Gastric ulcer 06/2010  . GERD (gastroesophageal reflux disease)   . HIV DISEASE 06/23/2006  . HYPERLIPIDEMIA 06/23/2006  . HYPERTENSION 06/23/2006   denies  . 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 Iron deficiency anemia   . Left foot infection   . Obesity (BMI 30-39.9)    BMI 34 kg/m^2  . Portal hypertensive gastropathy ([place]) 01/02/2013  . Recent shoulder injury    left shoulder/fell in parking lot/ no surgery/December 26, 2016  . SBP (spontaneous bacterial peritonitis) ([place]) 05/03/2011   Suspected by high leukocytes on paracentesis.  Clinical scenario also compatible. November 2012 responded to Levaquin. Started on trimethoprim-sulfamethoxazole double strength daily for prophylaxis.   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 Skin cancer     left calf  . STRAIN, CHEST WALL 03/15/2007  . Varices, esophageal ([place]) 06/2010  . Venous stasis 03/02/2016    Past Surgical History:  Procedure Laterality Date  . CARPAL TUNNEL RELEASE     left  . COLONOSCOPY  march 2013  . ESOPHAGEAL BANDING  05/01/2013   Procedure: ESOPHAGEAL BANDING;  Surgeon: Gatha Mayer, MD;  Location: [facility];  Service: Endoscopy;;  . ESOPHAGOGASTRODUODENOSCOPY  07/01/2010;  08/12/10   small varices, gastric ulcer  . ESOPHAGOGASTRODUODENOSCOPY  10/26/2011   Procedure: ESOPHAGOGASTRODUODENOSCOPY (EGD);  Surgeon: Gatha Mayer, MD;  Location: [facility];  Service: Endoscopy;  Laterality: N/A;  . ESOPHAGOGASTRODUODENOSCOPY N/A 01/02/2013   Procedure: ESOPHAGOGASTRODUODENOSCOPY (EGD);  Surgeon: Gatha Mayer, MD;  Location: [facility];  Service: Endoscopy;  Laterality: N/A;  . ESOPHAGOGASTRODUODENOSCOPY N/A 04/23/2013   Procedure: ESOPHAGOGASTRODUODENOSCOPY (EGD);  Surgeon: Gatha Mayer, MD;  Location: [facility];  Service: Endoscopy;  Laterality: N/A;  . ESOPHAGOGASTRODUODENOSCOPY N/A 05/01/2013   Procedure: ESOPHAGOGASTRODUODENOSCOPY (EGD);  Surgeon: Gatha Mayer, MD;  Location: [facility];  Service: Endoscopy;  Laterality: N/A;  follow-up varices and possibly band them  . ESOPHAGOGASTRODUODENOSCOPY N/A 09/04/2013   Procedure: ESOPHAGOGASTRODUODENOSCOPY (EGD);  Surgeon: Gatha Mayer, MD;  Location: [facility];  Service: Endoscopy;  Laterality: N/A;  . ESOPHAGOGASTRODUODENOSCOPY N/A 09/10/2014   Procedure: ESOPHAGOGASTRODUODENOSCOPY (EGD);  Surgeon: Gatha Mayer, MD;  Location: [facility];  Service: Endoscopy;  Laterality: N/A;  . ESOPHAGOGASTRODUODENOSCOPY (EGD) WITH PROPOFOL N/A 08/21/2015   Procedure: ESOPHAGOGASTRODUODENOSCOPY (EGD) WITH PROPOFOL;  Surgeon: Gatha Mayer, MD;  Location: [facility];  Service: Endoscopy;  Laterality: N/A;  . ESOPHAGOGASTRODUODENOSCOPY (EGD) WITH PROPOFOL N/A 04/06/2017   Procedure: ESOPHAGOGASTRODUODENOSCOPY  (EGD) WITH PROPOFOL;  Surgeon: Gatha Mayer, MD;  Location: [facility];  Service: Endoscopy;  Laterality: N/A;  . ESOPHAGOGASTRODUODENOSCOPY W/ BANDING  06/26/2010   variceal ligation  . GASTRIC VARICES BANDING N/A 01/02/2013   Procedure: GASTRIC VARICES BANDING;  Surgeon: Gatha Mayer, MD;  Location: [facility];  Service: Endoscopy;  Laterality: N/A;  possible banding  . UPPER GASTROINTESTINAL ENDOSCOPY     Family History:  Family History  Problem Relation Age of Onset  . Hyperlipidemia Mother   . Hypertension Mother   . Breast cancer Mother        questionable  . Heart disease Mother   . Hypertension Father   . Irritable bowel syndrome Father   . Drug abuse Brother   . Heart disease Brother   . Breast cancer Maternal Aunt        maternal great aunt  . Alcohol abuse Other   . Heart disease Maternal Uncle   . Irritable bowel syndrome Paternal Aunt   . Colon cancer Neg Hx    Family Psychiatric  History: See admission H&P Social History:  Social History   Substance and Sexual Activity  Alcohol Use Yes   Comment: pt is an alcoholic currently in [employer] meetings     Social History   Substance and Sexual Activity  Drug Use No    Social History   Socioeconomic History  . Marital status: Single    Spouse name: Not on file  . Number of children: 0  . Years of education: Not on file  . Highest education level: Not on file  Occupational History  .  Occupation: disability  Social Needs  . Financial resource strain: Not on file  . Food insecurity:    Worry: Not on file    Inability: Not on file  . Transportation needs:    Medical: Not on file    Non-medical: Not on file  Tobacco Use  . Smoking status: Former Smoker    Packs/day: 0.10    Years: 10.00    Pack years: 1.00    Types: Cigarettes    Start date: 03/03/2014    Last attempt to quit: 04/03/2014    Years since quitting: 3.5  . Smokeless tobacco: Never Used  . Tobacco comment: Chews nicorette gum.   Substance and Sexual Activity  . Alcohol use: Yes    Comment: pt is an alcoholic currently in [employer]  . Drug use: No  . Sexual activity: Yes    Partners: Male    Birth control/protection: Condom    Comment: pt. declined condoms  Lifestyle  . Physical activity:    Days per week: Not on file    Minutes per session: Not on file  . Stress: Not on file  Relationships  . Social connections:    Talks on phone: Not on file    Gets together: Not on file    Attends religious service: Not on file    Active member of club or organization: Not on file    Attends meetings of clubs or organizations: Not on file    Relationship status: Not on file  Other Topics Concern  . Not on file  Social History Narrative   Single, disabled hair stylist   Lives with parents in a home with a basement.  Parents do not like for him to go down stairs because they are steep.           Additional Social History:                         Sleep: Fair  Appetite:  Fair  Current Medications: Current Facility-Administered Medications  Medication Dose Route Frequency Provider Last Rate Last Dose  . aMILoride (MIDAMOR) tablet 15 mg  15 mg Oral Daily Rankin, Shuvon B, NP   15 mg at 10/07/17 0801  . amitriptyline (ELAVIL) tablet 75 mg  75 mg Oral QHS Sharma Covert, MD   75 mg at 10/06/17 2126  . benzocaine (ORAJEL) 10 % mucosal gel   Mouth/Throat BID PRN Lindon Romp A, NP      . dolutegravir (TIVICAY) tablet 50 mg  50 mg Oral Daily Rankin, Shuvon B, NP   50 mg at 10/07/17 0800  . emtricitabine-tenofovir AF (DESCOVY) 200-25 MG per tablet 1 tablet  1 tablet Oral Daily Rankin, Shuvon B, NP   1 tablet at 10/07/17 0801  . furosemide (LASIX) tablet 40 mg  40 mg Oral Daily Rankin, Shuvon B, NP   40 mg at 10/07/17 0801  . hydrOXYzine (ATARAX/VISTARIL) tablet 25 mg  25 mg Oral Q4H PRN Sharma Covert, MD   25 mg at 10/06/17 2126  . ibuprofen (ADVIL,MOTRIN) tablet 400 mg  400 mg Oral Q6H  PRN Sharma Covert, MD      . lactulose (CHRONULAC) 10 GM/15ML solution 20 g  20 g Oral TID Sharma Covert, MD      . LORazepam (ATIVAN) tablet 0.5 mg  0.5 mg Oral Q6H PRN Sharma Covert, MD   0.5 mg at 10/06/17 1730  . nicotine polacrilex (NICORETTE) gum  2 mg  2 mg Oral PRN Sharma Covert, MD      . pantoprazole (PROTONIX) EC tablet 40 mg  40 mg Oral Daily Rankin, Shuvon B, NP   40 mg at 10/07/17 0801  . potassium chloride SA (K-DUR,KLOR-CON) CR tablet 40 mEq  40 mEq Oral BID Rankin, Shuvon B, NP   40 mEq at 10/07/17 0800  . rifaximin (XIFAXAN) tablet 550 mg  550 mg Oral BID Rankin, Shuvon B, NP   550 mg at 10/07/17 0800  . simethicone (MYLICON) chewable tablet 80 mg  80 mg Oral TID PRN Rankin, Shuvon B, NP        Lab Results:  Results for orders placed or performed during the hospital encounter of 10/05/17 (from the past 48 hour(s))  Hepatic function panel     Status: Abnormal   Collection Time: 10/07/17  6:26 AM  Result Value Ref Range   Total Protein 7.1 6.5 - 8.1 g/dL   Albumin 3.9 3.5 - 5.0 g/dL   AST 32 15 - 41 U/L   ALT 22 17 - 63 U/L   Alkaline Phosphatase 117 38 - 126 U/L   Total Bilirubin 1.6 (H) 0.3 - 1.2 mg/dL   Bilirubin, Direct 0.4 0.1 - 0.5 mg/dL   Indirect Bilirubin 1.2 (H) 0.3 - 0.9 mg/dL    Comment: Performed at [facility], [place] [address], [place], [place] [address]  Ammonia     Status: Abnormal   Collection Time: 10/07/17  6:26 AM  Result Value Ref Range   Ammonia 76 (H) 9 - 35 umol/L    Comment: Performed at [facility], [place] [address] [place], [place] [address]    Blood Alcohol level:  Lab Results  Component Value Date   ETH <10 10/02/2017   ETH <5 76/73/4193    Metabolic Disorder Labs: No results found for: HGBA1C, MPG No results found for: PROLACTIN Lab Results  Component Value Date   CHOL 152 08/22/2017   TRIG 68 08/22/2017   HDL 56 08/22/2017   CHOLHDL 2.7 08/22/2017   VLDL 23 08/10/2016    LDLCALC 82 08/22/2017   LDLCALC 57 08/10/2016    Physical Findings: AIMS: Facial and Oral Movements Muscles of Facial Expression: None, normal Lips and Perioral Area: None, normal Jaw: None, normal Tongue: None, normal,Extremity Movements Upper (arms, wrists, hands, fingers): None, normal Lower (legs, knees, ankles, toes): None, normal, Trunk Movements Neck, shoulders, hips: None, normal, Overall Severity Severity of abnormal movements (highest score from questions above): None, normal Incapacitation due to abnormal movements: None, normal Patient's awareness of abnormal movements (rate only patient's report): No Awareness, Dental Status Current problems with teeth and/or dentures?: No Does patient usually wear dentures?: No  CIWA:    COWS:     Musculoskeletal: Strength & Muscle Tone: within normal limits Gait & Station: normal Patient leans: N/A  Psychiatric Specialty Exam: Physical Exam  Nursing note and vitals reviewed. Constitutional: He is oriented to person, place, and time. He appears well-developed and well-nourished.  HENT:  Head: Normocephalic and atraumatic.  Respiratory: Effort normal.  Musculoskeletal: Normal range of motion.  Neurological: He is alert and oriented to person, place, and time.    ROS  Blood pressure 125/77, pulse 80, temperature 98 F (36.7 C), temperature source Oral, resp. rate 20, height _0  (1.803 m), weight (!) 147.9 kg (326 lb).Body mass index is 45.47 kg/m.  General Appearance: Casual  Eye Contact:  Fair  Speech:  Normal Rate  Volume:  Normal  Mood:  Dysphoric  Affect:  Congruent  Thought Process:  Coherent  Orientation:  Full (Time, Place, and Person)  Thought Content:  Logical  Suicidal Thoughts:  No  Homicidal Thoughts:  No  Memory:  Immediate;   Fair  Judgement:  Intact  Insight:  Fair  Psychomotor Activity:  Increased  Concentration:  Concentration: Fair  Recall:  [employer] of Knowledge:  Fair  Language:  Fair   Akathisia:  Negative  Handed:  Right  AIMS (if indicated):     Assets:  Desire for Improvement Housing Resilience Social Support  ADL's:  Intact  Cognition:  WNL  Sleep:  Number of Hours: 5.5     Treatment Plan Summary: Daily contact with patient to assess and evaluate symptoms and progress in treatment, Medication management and Plan Patient is seen and examined.  Patient is a 52 year old male with the above-stated past psychiatric history seen in follow-up.  He is improving from a depression standpoint.  His moods little bit better today, his sleep is improved.  He denied any side effects to his current medications.  He denied any suicidal ideation.  We will continue with the try cyclic antidepressants.  I am a bit uncomfortable with this, but given the intolerance to other medications this may be our best bet.  We also discussed his benzodiazepines, and he is aware that these will be completely weaned off.  His ammonia is a bit elevated today, and I am can increase his lactulose to 20 g p.o. 3 times daily versus the 10 g p.o. 3 times daily.  We will recheck his ammonia in 2 days.  His liver function enzymes are normal.  His blood pressure is stable, and he continues on his HIV medications without difficulty.  No other changes in his medications.  Sharma Covert, MD 10/07/2017, 10:54 AM

## 2017-10-07 NOTE — [facility] Group Notes (Signed)
10/07/17, 1315  Pt participate in unstructured group session today to discuss future directions for pt based on having a few days in the hospital to step back and consider their current situation.  Pt shared about his home situation, would like to start going to the [facility] again in the future. Winferd Humphrey, MSW, LCSW Clinical Social Worker 10/07/2017 2:22 PM

## 2017-10-07 NOTE — Progress Notes (Signed)
Recreation Therapy Notes  Date: 4.26.19 Time: 0930 Location: 300 Hall Dayroom  Group Topic: Stress Management  Goal Area(s) Addresses:  Patient will verbalize importance of using healthy stress management.  Patient will identify positive emotions associated with healthy stress management.   Intervention: Stress Management  Activity : Gratitude Meditation.  LRT played a meditation on gratitude.  Patients were to listen and follow along as the meditation played to fully engage in the activity.  Education:  Stress Management, Discharge Planning.   Education Outcome: Acknowledges edcuation/In group clarification offered/Needs additional education  Clinical Observations/Feedback: Pt did not attend group.    [name], LRT/CTRS         [name] A 10/07/2017 10:34 AM

## 2017-10-07 NOTE — Progress Notes (Signed)
D.  Pt pleasant on approach, denies complaints at this time.  Pt was positive for evening wrap up group.  Pt observed interacting appropriately with peers on the unit.  Pt denies SI/HI/AVH at this time.  A.  Support and encouragement offered, medication given as ordered  R.  Pt remains safe on the unit, will continue to monitor.

## 2017-10-07 NOTE — Progress Notes (Signed)
Adult Psychoeducational Group Note  Date:  10/07/2017 Time:  12:56 PM  Group Topic/Focus:  Recovery Goals:   The focus of this group is to identify appropriate goals for recovery and establish a plan to achieve them.  Participation Level:  Active  Participation Quality:  Appropriate  Affect:  Appropriate  Cognitive:  Appropriate  Insight: Good  Engagement in Group:  Engaged  Modes of Intervention:  Discussion  Additional Comments:  Pt talked about the loss of his mother and how it negatively effected him. He gave advice for other peers to rely on their faith and support groups to help them get through hard times. As well as not to take things on alone.    [name] 10/07/2017, 12:56 PM

## 2017-10-07 NOTE — Progress Notes (Addendum)
CSW left message for pt brother, [name], [phone], to check on status of guns in the home. Winferd Humphrey, MSW, LCSW Clinical Social Worker 10/07/2017 1:08 PM   CSW received call back from [place], who had several questions about medications and pt discharge date.  [name] did confirm that there are several hunting rifles in the home but he agreed to remove the guns and keep them at his house from now on. Winferd Humphrey, MSW, LCSW Clinical Social Worker 10/07/2017 2:15 PM

## 2017-10-08 DIAGNOSIS — Z813 Family history of other psychoactive substance abuse and dependence: Secondary | ICD-10-CM

## 2017-10-08 DIAGNOSIS — F1020 Alcohol dependence, uncomplicated: Secondary | ICD-10-CM

## 2017-10-08 DIAGNOSIS — Z811 Family history of alcohol abuse and dependence: Secondary | ICD-10-CM

## 2017-10-08 DIAGNOSIS — F332 Major depressive disorder, recurrent severe without psychotic features: Principal | ICD-10-CM

## 2017-10-08 DIAGNOSIS — Z87891 Personal history of nicotine dependence: Secondary | ICD-10-CM

## 2017-10-08 DIAGNOSIS — K7290 Hepatic failure, unspecified without coma: Secondary | ICD-10-CM

## 2017-10-08 DIAGNOSIS — K7469 Other cirrhosis of liver: Secondary | ICD-10-CM

## 2017-10-08 DIAGNOSIS — B20 Human immunodeficiency virus [HIV] disease: Secondary | ICD-10-CM

## 2017-10-08 DIAGNOSIS — Z6841 Body Mass Index (BMI) 40.0 and over, adult: Secondary | ICD-10-CM

## 2017-10-08 MED ORDER — HYDROXYZINE HCL 50 MG PO TABS
50.0000 mg | ORAL_TABLET | Freq: Once | ORAL | Status: AC | PRN
  Administered 2017-10-09: 50 mg via ORAL
  Filled 2017-10-08: qty 1

## 2017-10-08 MED ORDER — HYDROXYZINE HCL 25 MG PO TABS
25.0000 mg | ORAL_TABLET | ORAL | Status: DC | PRN
  Administered 2017-10-08 – 2017-10-11 (×3): 25 mg via ORAL
  Filled 2017-10-08 (×3): qty 1

## 2017-10-08 MED ORDER — LORAZEPAM 0.5 MG PO TABS: 0.5000 mg | ORAL_TABLET | Freq: Three times a day (TID) | ORAL | Status: DC | PRN

## 2017-10-08 NOTE — [facility] Group Notes (Signed)
Adult Psychoeducational Group Note  Date:  10/08/2017 Time:  8:56 AM  Group Topic/Focus:  Goals Group:   The focus of this group is to help patients establish daily goals to achieve during treatment and discuss how the patient can incorporate goal setting into their daily lives to aide in recovery.  Participation Level:  Active  Participation Quality:  Appropriate  Affect:  Appropriate  Cognitive:  Appropriate  Insight: Appropriate  Engagement in Group:  Engaged  Modes of Intervention:  Orientation  Additional Comments:  Pt attended and participated in orientation/goals group. Pt goal for today is to stay positive and hopeful.  Huel Cote 10/08/2017, 8:56 AM

## 2017-10-08 NOTE — Progress Notes (Signed)
D. Pt presents with a flat affect but brightens upon interactions. Pt observed interacting well among peers in dayroom. Pt reports having slept well last night and endorses having a good appetite and 'high' energy level on self inventory. Per pt's self inventory, pt rates his depression and anxiety both a 5/10 . Pt writes that his most important goal today is "staying positive". Pt currently denies SI/HI and AV hallucinations. A. Labs and vitals monitored. Pt compliant with medications. Pt supported emotionally and encouraged to express concerns and ask questions.   R. Pt remains safe with 15 minute checks. Will continue POC.

## 2017-10-08 NOTE — [facility] Group Notes (Signed)
LCSW Group Therapy Note  10/08/2017   10:00--11:00am   Type of Therapy and Topic:  Group Therapy: Anger Cues and Responses  Participation Level:  Active   Description of Group:   In this group, patients learned how to recognize the physical, cognitive, emotional, and behavioral responses they have to anger-provoking situations.  They identified a recent time they became angry and how they reacted.  They analyzed how their reaction was possibly beneficial and how it was possibly unhelpful.  The group discussed a variety of healthier coping skills that could help with such a situation in the future.  Deep breathing was practiced briefly.  Therapeutic Goals: 1. Patients will remember their last incident of anger and how they felt emotionally and physically, what their thoughts were at the time, and how they behaved. 2. Patients will identify how their behavior at that time worked for them, as well as how it worked against them. 3. Patients will explore possible new behaviors to use in future anger situations. 4. Patients will learn that anger itself is normal and cannot be eliminated, and that healthier reactions can assist with resolving conflict rather than worsening situations.  Summary of Patient Progress:  The patient shared that his most recent time of anger was last week and said he reacted by taking an overdose in a suicide attempt, which he regretted quickly.  He specified that he was already angry about his mother dying recently but then his cousin was harsh and critical and accused patient of "running my mother into the ground."  He had some difficulty staying on topic and told some lengthy and tangential anecdotes, but was engaged and interested in the topic, indicated a willingness to work in outpatient therapy on improving his self-awareness.  He also described techniques of breathing that work to calm himself down effectively.  Therapeutic Modalities:   Cognitive Behavioral  Therapy  Maretta Los

## 2017-10-08 NOTE — Progress Notes (Signed)
D.  Pt pleasant on approach, complaint of anxiety.  Pt requested the Ativan 0.5 that he had last night and it was found that the order had been discontinued.  Pt encouraged to speak to doctor about his concerns.  Pt agreed to take Vistaril for anxiety.  Pt was positive for evening wrap up group, has been observed engaged appropriately with peers on the unit.  Pt denies SI/HI/AVH at this time.  A.  Support and encouragement offered, medication given as ordered  R.  Pt remains safe on the unit, will continue to monitor.

## 2017-10-08 NOTE — Progress Notes (Signed)
[facility] MD Progress Note  10/08/2017 11:06 AM [name]  MRN:  [id number] Subjective: Patient is seen and examined.  Patient is a 52 year old male with a past psychiatric history significant for major depression as well as alcohol use disorder and a past medical history significant for HIV as well as hepatic cirrhosis.  He is seen in follow-up.  He seems a little bit better today.  His cognition is improving.  His lactulose was increased to 20 g p.o. 3 times daily yesterday, and that seems to be bringing down his ammonia little bit better.  His mood is also improved.  He talks about his plan after discharge, and I think he is got a little bit too much on his plate.  We talked about regulating how many activities he gets involved with after discharge.  We also talked about some grief issues with regard to his mother.  He denied any side effects to his current medications.  His blood pressure is controlled.  He denied any suicidal ideation today. Principal Problem: <principal problem not specified> Diagnosis:   Patient Active Problem List   Diagnosis Date Noted  . MDD (major depressive disorder), recurrent severe, without psychosis ([place]) [F33.2]   . Alcoholism ([place]) [F10.20] 10/03/2017  . Obesity, Class III, BMI 40-49.9 (morbid obesity) ([place]) [E66.01] 10/03/2017  . Hepatic encephalopathy ([place]) [K72.90] 10/02/2017  . Suicidal ideation [R45.851]   . Secondary esophageal varices with bleeding (HCC) [I85.11]   . Venous stasis [I87.8] 03/02/2016  . Secondary esophageal varices without bleeding (HCC) [I85.10]   . Erectile dysfunction [N52.9] 07/14/2015  . Encephalopathy, hepatic ([place]) [K72.90] 01/23/2015  . Peripheral edema [R60.9] 12/31/2014  . Constipation [K59.00] 07/15/2014  . Fall [W19.XXXA] 01/07/2014  . Orthostatic hypotension [I95.1] 05/28/2013  . Personal history of failed moderate sedation- MUST HAVE MAC OR GENERAL [Z92.83] 05/01/2013  . Portal hypertensive gastropathy ([place]) [K76.6,  K31.89] 01/02/2013  . Acquired pancytopenia ([place]) [W09.811] 05/19/2011  . Insomnia [G47.00] 12/21/2010  . Alcoholic cirrhosis of liver ([place]) [K70.30] 07/15/2010  . Idiopathic peripheral autonomic neuropathy [G90.09] 05/25/2007  . Idiopathic peripheral autonomic neuropathy, unspecified [G90.09] 05/25/2007  . SINUSITIS, CHRONIC MAXILLARY [J32.0] 03/06/2007  . Human immunodeficiency virus (HIV) disease ([place]) [B20] 06/23/2006  . HYPERLIPIDEMIA [E78.5] 06/23/2006  . Anxiety state [F41.1] 06/23/2006  . Depression [F32.9] 06/23/2006  . HYPERTENSION [I10] 06/23/2006  . Reflux esophagitis [K21.0] 06/23/2006   Total Time spent with patient: 20 minutes  Past Psychiatric History: See admission H&P  Past Medical History:  Past Medical History:  Diagnosis Date  . Alcoholic cirrhosis of liver ([place])   . Alcoholism, chronic ([place])   . ANXIETY 06/23/2006  . Ascites 11/13/2009  . Ascites   . Avulsion fracture of middle phalanges of  3rd/4th fingers left hand 01/07/2014  . Blood dyscrasia    HIV  . Cellulitis of right lower extremity 03/02/2016  . DEPRESSION 06/23/2006  . 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 Erectile dysfunction 07/14/2015  . 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 Gastric ulcer 06/2010  . GERD (gastroesophageal reflux disease)   . HIV DISEASE 06/23/2006  . HYPERLIPIDEMIA 06/23/2006  . HYPERTENSION 06/23/2006   denies  . 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 Iron deficiency anemia   . Left foot infection   . Obesity (BMI 30-39.9)    BMI 34 kg/m^2  . Portal hypertensive gastropathy ([place]) 01/02/2013  . Recent shoulder injury    left shoulder/fell in parking lot/ no surgery/December 26, 2016  . SBP (spontaneous bacterial peritonitis) ([place])  05/03/2011   Suspected by high leukocytes on paracentesis. Clinical scenario also compatible. November 2012 responded to Levaquin. Started on trimethoprim-sulfamethoxazole double strength daily for prophylaxis.   Marland Kitchen SINUSITIS,  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  . Skin cancer    left calf  . STRAIN, CHEST WALL 03/15/2007  . Varices, esophageal ([place]) 06/2010  . Venous stasis 03/02/2016    Past Surgical History:  Procedure Laterality Date  . CARPAL TUNNEL RELEASE     left  . COLONOSCOPY  march 2013  . ESOPHAGEAL BANDING  05/01/2013   Procedure: ESOPHAGEAL BANDING;  Surgeon: Gatha Mayer, MD;  Location: [facility];  Service: Endoscopy;;  . ESOPHAGOGASTRODUODENOSCOPY  07/01/2010;  08/12/10   small varices, gastric ulcer  . ESOPHAGOGASTRODUODENOSCOPY  10/26/2011   Procedure: ESOPHAGOGASTRODUODENOSCOPY (EGD);  Surgeon: Gatha Mayer, MD;  Location: [facility];  Service: Endoscopy;  Laterality: N/A;  . ESOPHAGOGASTRODUODENOSCOPY N/A 01/02/2013   Procedure: ESOPHAGOGASTRODUODENOSCOPY (EGD);  Surgeon: Gatha Mayer, MD;  Location: [facility];  Service: Endoscopy;  Laterality: N/A;  . ESOPHAGOGASTRODUODENOSCOPY N/A 04/23/2013   Procedure: ESOPHAGOGASTRODUODENOSCOPY (EGD);  Surgeon: Gatha Mayer, MD;  Location: [facility];  Service: Endoscopy;  Laterality: N/A;  . ESOPHAGOGASTRODUODENOSCOPY N/A 05/01/2013   Procedure: ESOPHAGOGASTRODUODENOSCOPY (EGD);  Surgeon: Gatha Mayer, MD;  Location: [facility];  Service: Endoscopy;  Laterality: N/A;  follow-up varices and possibly band them  . ESOPHAGOGASTRODUODENOSCOPY N/A 09/04/2013   Procedure: ESOPHAGOGASTRODUODENOSCOPY (EGD);  Surgeon: Gatha Mayer, MD;  Location: [facility];  Service: Endoscopy;  Laterality: N/A;  . ESOPHAGOGASTRODUODENOSCOPY N/A 09/10/2014   Procedure: ESOPHAGOGASTRODUODENOSCOPY (EGD);  Surgeon: Gatha Mayer, MD;  Location: [facility];  Service: Endoscopy;  Laterality: N/A;  . ESOPHAGOGASTRODUODENOSCOPY (EGD) WITH PROPOFOL N/A 08/21/2015   Procedure: ESOPHAGOGASTRODUODENOSCOPY (EGD) WITH PROPOFOL;  Surgeon: Gatha Mayer, MD;  Location: [facility];  Service: Endoscopy;  Laterality: N/A;  . ESOPHAGOGASTRODUODENOSCOPY (EGD) WITH PROPOFOL N/A  04/06/2017   Procedure: ESOPHAGOGASTRODUODENOSCOPY (EGD) WITH PROPOFOL;  Surgeon: Gatha Mayer, MD;  Location: [facility];  Service: Endoscopy;  Laterality: N/A;  . ESOPHAGOGASTRODUODENOSCOPY W/ BANDING  06/26/2010   variceal ligation  . GASTRIC VARICES BANDING N/A 01/02/2013   Procedure: GASTRIC VARICES BANDING;  Surgeon: Gatha Mayer, MD;  Location: [facility];  Service: Endoscopy;  Laterality: N/A;  possible banding  . UPPER GASTROINTESTINAL ENDOSCOPY     Family History:  Family History  Problem Relation Age of Onset  . Hyperlipidemia Mother   . Hypertension Mother   . Breast cancer Mother        questionable  . Heart disease Mother   . Hypertension Father   . Irritable bowel syndrome Father   . Drug abuse Brother   . Heart disease Brother   . Breast cancer Maternal Aunt        maternal great aunt  . Alcohol abuse Other   . Heart disease Maternal Uncle   . Irritable bowel syndrome Paternal Aunt   . Colon cancer Neg Hx    Family Psychiatric  History: See admission H&P Social History:  Social History   Substance and Sexual Activity  Alcohol Use Yes   Comment: pt is an alcoholic currently in [employer] meetings     Social History   Substance and Sexual Activity  Drug Use No    Social History   Socioeconomic History  . Marital status: Single    Spouse name: Not on file  . Number of children: 0  . Years of education: Not on file  . Highest education  level: Not on file  Occupational History  . Occupation: disability  Social Needs  . Financial resource strain: Not on file  . Food insecurity:    Worry: Not on file    Inability: Not on file  . Transportation needs:    Medical: Not on file    Non-medical: Not on file  Tobacco Use  . Smoking status: Former Smoker    Packs/day: 0.10    Years: 10.00    Pack years: 1.00    Types: Cigarettes    Start date: 03/03/2014    Last attempt to quit: 04/03/2014    Years since quitting: 3.5  . Smokeless tobacco:  Never Used  . Tobacco comment: Chews nicorette gum.  Substance and Sexual Activity  . Alcohol use: Yes    Comment: pt is an alcoholic currently in [employer]  . Drug use: No  . Sexual activity: Yes    Partners: Male    Birth control/protection: Condom    Comment: pt. declined condoms  Lifestyle  . Physical activity:    Days per week: Not on file    Minutes per session: Not on file  . Stress: Not on file  Relationships  . Social connections:    Talks on phone: Not on file    Gets together: Not on file    Attends religious service: Not on file    Active member of club or organization: Not on file    Attends meetings of clubs or organizations: Not on file    Relationship status: Not on file  Other Topics Concern  . Not on file  Social History Narrative   Single, disabled hair stylist   Lives with parents in a home with a basement.  Parents do not like for him to go down stairs because they are steep.           Additional Social History:                         Sleep: Good  Appetite:  Good  Current Medications: Current Facility-Administered Medications  Medication Dose Route Frequency Provider Last Rate Last Dose  . aMILoride (MIDAMOR) tablet 15 mg  15 mg Oral Daily Rankin, Shuvon B, NP   15 mg at 10/08/17 0752  . amitriptyline (ELAVIL) tablet 75 mg  75 mg Oral QHS Sharma Covert, MD   75 mg at 10/07/17 2133  . benzocaine (ORAJEL) 10 % mucosal gel   Mouth/Throat BID PRN Lindon Romp A, NP      . dolutegravir (TIVICAY) tablet 50 mg  50 mg Oral Daily Rankin, Shuvon B, NP   50 mg at 10/08/17 0753  . emtricitabine-tenofovir AF (DESCOVY) 200-25 MG per tablet 1 tablet  1 tablet Oral Daily Rankin, Shuvon B, NP   1 tablet at 10/08/17 0753  . furosemide (LASIX) tablet 40 mg  40 mg Oral Daily Rankin, Shuvon B, NP   40 mg at 10/08/17 0759  . hydrOXYzine (ATARAX/VISTARIL) tablet 25 mg  25 mg Oral Q4H PRN Cobos, Fernando A, MD      . ibuprofen (ADVIL,MOTRIN)  tablet 400 mg  400 mg Oral Q6H PRN Sharma Covert, MD      . lactulose (CHRONULAC) 10 GM/15ML solution 20 g  20 g Oral TID Sharma Covert, MD   20 g at 10/08/17 0754  . LORazepam (ATIVAN) tablet 0.5 mg  0.5 mg Oral Q8H PRN Cobos, Myer Peer, MD      .  nicotine polacrilex (NICORETTE) gum 2 mg  2 mg Oral PRN Sharma Covert, MD   2 mg at 10/08/17 0756  . pantoprazole (PROTONIX) EC tablet 40 mg  40 mg Oral Daily Rankin, Shuvon B, NP   40 mg at 10/08/17 0752  . potassium chloride SA (K-DUR,KLOR-CON) CR tablet 40 mEq  40 mEq Oral BID Rankin, Shuvon B, NP   40 mEq at 10/08/17 0753  . rifaximin (XIFAXAN) tablet 550 mg  550 mg Oral BID Rankin, Shuvon B, NP   550 mg at 10/08/17 0753  . simethicone (MYLICON) chewable tablet 80 mg  80 mg Oral TID PRN Rankin, Shuvon B, NP        Lab Results:  Results for orders placed or performed during the hospital encounter of 10/05/17 (from the past 48 hour(s))  Hepatic function panel     Status: Abnormal   Collection Time: 10/07/17  6:26 AM  Result Value Ref Range   Total Protein 7.1 6.5 - 8.1 g/dL   Albumin 3.9 3.5 - 5.0 g/dL   AST 32 15 - 41 U/L   ALT 22 17 - 63 U/L   Alkaline Phosphatase 117 38 - 126 U/L   Total Bilirubin 1.6 (H) 0.3 - 1.2 mg/dL   Bilirubin, Direct 0.4 0.1 - 0.5 mg/dL   Indirect Bilirubin 1.2 (H) 0.3 - 0.9 mg/dL    Comment: Performed at [facility], [place] [address] [place], [place] [address]  Ammonia     Status: Abnormal   Collection Time: 10/07/17  6:26 AM  Result Value Ref Range   Ammonia 76 (H) 9 - 35 umol/L    Comment: Performed at [facility], [place] [address] [place], [place] [address]    Blood Alcohol level:  Lab Results  Component Value Date   ETH <10 10/02/2017   ETH <5 95/32/0233    Metabolic Disorder Labs: No results found for: HGBA1C, MPG No results found for: PROLACTIN Lab Results  Component Value Date   CHOL 152 08/22/2017   TRIG 68 08/22/2017   HDL 56 08/22/2017    CHOLHDL 2.7 08/22/2017   VLDL 23 08/10/2016   LDLCALC 82 08/22/2017   LDLCALC 57 08/10/2016    Physical Findings: AIMS: Facial and Oral Movements Muscles of Facial Expression: None, normal Lips and Perioral Area: None, normal Jaw: None, normal Tongue: None, normal,Extremity Movements Upper (arms, wrists, hands, fingers): None, normal Lower (legs, knees, ankles, toes): None, normal, Trunk Movements Neck, shoulders, hips: None, normal, Overall Severity Severity of abnormal movements (highest score from questions above): None, normal Incapacitation due to abnormal movements: None, normal Patient's awareness of abnormal movements (rate only patient's report): No Awareness, Dental Status Current problems with teeth and/or dentures?: No Does patient usually wear dentures?: No  CIWA:    COWS:     Musculoskeletal: Strength & Muscle Tone: within normal limits Gait & Station: shuffle Patient leans: N/A  Psychiatric Specialty Exam: Physical Exam  Nursing note and vitals reviewed. Constitutional: He is oriented to person, place, and time. He appears well-developed and well-nourished.  HENT:  Head: Normocephalic and atraumatic.  Respiratory: Effort normal.  Musculoskeletal: Normal range of motion.  Neurological: He is alert and oriented to person, place, and time.    ROS  Blood pressure 118/69, pulse 96, temperature 98.8 F (37.1 C), temperature source Oral, resp. rate 20, height _0  (1.803 m), weight (!) 147.9 kg (326 lb).Body mass index is 45.47 kg/m.  General Appearance: Casual  Eye Contact:  Fair  Speech:  Normal Rate  Volume:  Normal  Mood:  Depressed  Affect:  Congruent  Thought Process:  Coherent  Orientation:  Full (Time, Place, and Person)  Thought Content:  Logical  Suicidal Thoughts:  No  Homicidal Thoughts:  No  Memory:  Immediate;   Fair  Judgement:  Intact  Insight:  Fair  Psychomotor Activity:  Normal  Concentration:  Concentration: Fair  Recall:   Good  Fund of Knowledge:  Good  Language:  Good  Akathisia:  No  Handed:  Right  AIMS (if indicated):     Assets:  Desire for Improvement Housing Resilience Social Support  ADL's:  Intact  Cognition:  WNL  Sleep:  Number of Hours: 4.5     Treatment Plan Summary: Daily contact with patient to assess and evaluate symptoms and progress in treatment, Medication management and Plan Patient is seen and examined.  Patient is a 52 year old male with the above-stated past psychiatric history seen in follow-up.  He seems to be doing better.  His mood seems to be slowly improving.  His cognition is improved with the increase lactulose, and hopefully decreased ammonia.  We will continue his amitriptyline at its current dosage.  I am going to make sure the benzodiazepines are completely off today.  We will repeat his ammonia and comprehensive metabolic panel tomorrow.  I anticipate will be able to discharge him either tomorrow or Monday depending on how he improves over the next 24 hours.  Sharma Covert, MD 10/08/2017, 11:06 AM

## 2017-10-08 NOTE — [facility] Group Notes (Signed)
[facility] Group Notes:  (Nursing)  Date:  10/08/2017  Time:  3:25 PM  Type of Therapy:  Nurse Education  Participation Level:  Active  Participation Quality:  Appropriate and Attentive  Affect:  Appropriate  Cognitive:  Alert and Appropriate  Insight:  Appropriate and Good  Engagement in Group:  Engaged  Modes of Intervention:  Discussion, Education and Problem-solving  Summary of Progress/Problems:   Patient was actively engaged in group. Group led by nurse consisted of playing a non competitive learning/communication board game that fosters listening skills as well as self expression.  Waymond Cera 10/08/2017, 3:25 PM

## 2017-10-09 LAB — COMPREHENSIVE METABOLIC PANEL
ALBUMIN: 3.8 g/dL (ref 3.5–5.0)
ALT: 21 U/L (ref 17–63)
AST: 32 U/L (ref 15–41)
Alkaline Phosphatase: 102 U/L (ref 38–126)
Anion gap: 9 (ref 5–15)
BUN: 13 mg/dL (ref 6–20)
CHLORIDE: 110 mmol/L (ref 101–111)
CO2: 22 mmol/L (ref 22–32)
Calcium: 9.5 mg/dL (ref 8.9–10.3)
Creatinine, Ser: 1.02 mg/dL (ref 0.61–1.24)
GFR calc Af Amer: >60 mL/min (ref >60)
GFR calc non Af Amer: >60 mL/min (ref >60)
Glucose, Bld: 75 mg/dL (ref 65–99)
POTASSIUM: 4.6 mmol/L (ref 3.5–5.1)
SODIUM: 141 mmol/L (ref 135–145)
Total Bilirubin: 1.6 mg/dL — ABNORMAL HIGH (ref 0.3–1.2)
Total Protein: 6.8 g/dL (ref 6.5–8.1)

## 2017-10-09 LAB — AMMONIA: Ammonia: 50 umol/L — ABNORMAL HIGH (ref 9–35)

## 2017-10-09 MED ORDER — LORAZEPAM 0.5 MG PO TABS
0.5000 mg | ORAL_TABLET | Freq: Four times a day (QID) | ORAL | Status: DC | PRN
  Administered 2017-10-09 – 2017-10-10 (×2): 0.5 mg via ORAL
  Filled 2017-10-09 (×2): qty 1

## 2017-10-09 MED ORDER — LORAZEPAM 0.5 MG PO TABS
0.5000 mg | ORAL_TABLET | Freq: Once | ORAL | Status: AC
Start: 2017-10-09 — End: 2017-10-09
  Administered 2017-10-09: 0.5 mg via ORAL
  Filled 2017-10-09: qty 1

## 2017-10-09 MED ORDER — AMITRIPTYLINE HCL 100 MG PO TABS
100.0000 mg | ORAL_TABLET | Freq: Every day | ORAL | Status: DC
  Administered 2017-10-09: 100 mg via ORAL
  Filled 2017-10-09 (×2): qty 1

## 2017-10-09 MED ORDER — DICLOFENAC SODIUM 1 % TD GEL
2.0000 g | Freq: Four times a day (QID) | TRANSDERMAL | Status: DC
  Administered 2017-10-09 – 2017-10-21 (×43): 2 g via TOPICAL
  Filled 2017-10-09 (×2): qty 100

## 2017-10-09 NOTE — Progress Notes (Signed)
[facility] MD Progress Note  10/09/2017 1:31 PM [name]  MRN:  [id number] Subjective: Patient is seen and examined.  Patient is a 52 year old male with a past psychiatric history significant for major depression as well as alcohol use disorder.  He also has a history of HIV as well as hepatic cirrhosis.  He seen in follow-up.  He stated his mood is doing fairly well.  He did not sleep very well last night.  His home dosage of amitriptyline is 100 mg and has been receiving 75.  We repeated his lab work again today and his ammonia is down to 50.  He continues on lactulose.  His CBC back on the 22nd showed that his platelets were at 66,000.  They appear to been going down over the last year.  This may be related either to his liver dysfunction or some of the HIV medications.  He denied any spontaneous bleeding.  He did complain of some arthritic pain in his hands, but otherwise denied suicidal or homicidal ideation.  His cognition is good.  Principal Problem: <principal problem not specified> Diagnosis:   Patient Active Problem List   Diagnosis Date Noted  . MDD (major depressive disorder), recurrent severe, without psychosis ([place]) [F33.2]   . Alcoholism ([place]) [F10.20] 10/03/2017  . Obesity, Class III, BMI 40-49.9 (morbid obesity) ([place]) [E66.01] 10/03/2017  . Hepatic encephalopathy ([place]) [K72.90] 10/02/2017  . Suicidal ideation [R45.851]   . Secondary esophageal varices with bleeding (HCC) [I85.11]   . Venous stasis [I87.8] 03/02/2016  . Secondary esophageal varices without bleeding (HCC) [I85.10]   . Erectile dysfunction [N52.9] 07/14/2015  . Encephalopathy, hepatic ([place]) [K72.90] 01/23/2015  . Peripheral edema [R60.9] 12/31/2014  . Constipation [K59.00] 07/15/2014  . Fall [W19.XXXA] 01/07/2014  . Orthostatic hypotension [I95.1] 05/28/2013  . Personal history of failed moderate sedation- MUST HAVE MAC OR GENERAL [Z92.83] 05/01/2013  . Portal hypertensive gastropathy ([place]) [K76.6, K31.89]  01/02/2013  . Acquired pancytopenia ([place]) [J00.938] 05/19/2011  . Insomnia [G47.00] 12/21/2010  . Alcoholic cirrhosis of liver ([place]) [K70.30] 07/15/2010  . Idiopathic peripheral autonomic neuropathy [G90.09] 05/25/2007  . Idiopathic peripheral autonomic neuropathy, unspecified [G90.09] 05/25/2007  . SINUSITIS, CHRONIC MAXILLARY [J32.0] 03/06/2007  . Human immunodeficiency virus (HIV) disease ([place]) [B20] 06/23/2006  . HYPERLIPIDEMIA [E78.5] 06/23/2006  . Anxiety state [F41.1] 06/23/2006  . Depression [F32.9] 06/23/2006  . HYPERTENSION [I10] 06/23/2006  . Reflux esophagitis [K21.0] 06/23/2006   Total Time spent with patient: 20 minutes  Past Psychiatric History: See admission H&P  Past Medical History:  Past Medical History:  Diagnosis Date  . Alcoholic cirrhosis of liver ([place])   . Alcoholism, chronic ([place])   . ANXIETY 06/23/2006  . Ascites 11/13/2009  . Ascites   . Avulsion fracture of middle phalanges of  3rd/4th fingers left hand 01/07/2014  . Blood dyscrasia    HIV  . Cellulitis of right lower extremity 03/02/2016  . DEPRESSION 06/23/2006  . 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 Erectile dysfunction 07/14/2015  . 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 Gastric ulcer 06/2010  . GERD (gastroesophageal reflux disease)   . HIV DISEASE 06/23/2006  . HYPERLIPIDEMIA 06/23/2006  . HYPERTENSION 06/23/2006   denies  . 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 Iron deficiency anemia   . Left foot infection   . Obesity (BMI 30-39.9)    BMI 34 kg/m^2  . Portal hypertensive gastropathy ([place]) 01/02/2013  . Recent shoulder injury    left shoulder/fell in parking lot/ no surgery/December 26, 2016  .  SBP (spontaneous bacterial peritonitis) (HCC) 05/03/2011   Suspected by high leukocytes on paracentesis. Clinical scenario also compatible. November 2012 responded to Levaquin. Started on trimethoprim-sulfamethoxazole double strength daily for prophylaxis.   . SINUSITIS, CHRONIC  MAXILLARY 03/06/2007  . Skin cancer    left calf  . STRAIN, CHEST WALL 03/15/2007  . Varices, esophageal (HCC) 06/2010  . Venous stasis 03/02/2016    Past Surgical History:  Procedure Laterality Date  . CARPAL TUNNEL RELEASE     left  . COLONOSCOPY  march 2013  . ESOPHAGEAL BANDING  05/01/2013   Procedure: ESOPHAGEAL BANDING;  Surgeon: Carl E Gessner, MD;  Location: [facility];  Service: Endoscopy;;  . ESOPHAGOGASTRODUODENOSCOPY  07/01/2010;  08/12/10   small varices, gastric ulcer  . ESOPHAGOGASTRODUODENOSCOPY  10/26/2011   Procedure: ESOPHAGOGASTRODUODENOSCOPY (EGD);  Surgeon: Carl E Gessner, MD;  Location: [facility];  Service: Endoscopy;  Laterality: N/A;  . ESOPHAGOGASTRODUODENOSCOPY N/A 01/02/2013   Procedure: ESOPHAGOGASTRODUODENOSCOPY (EGD);  Surgeon: Carl E Gessner, MD;  Location: [facility];  Service: Endoscopy;  Laterality: N/A;  . ESOPHAGOGASTRODUODENOSCOPY N/A 04/23/2013   Procedure: ESOPHAGOGASTRODUODENOSCOPY (EGD);  Surgeon: Carl E Gessner, MD;  Location: [facility];  Service: Endoscopy;  Laterality: N/A;  . ESOPHAGOGASTRODUODENOSCOPY N/A 05/01/2013   Procedure: ESOPHAGOGASTRODUODENOSCOPY (EGD);  Surgeon: Carl E Gessner, MD;  Location: [facility];  Service: Endoscopy;  Laterality: N/A;  follow-up varices and possibly band them  . ESOPHAGOGASTRODUODENOSCOPY N/A 09/04/2013   Procedure: ESOPHAGOGASTRODUODENOSCOPY (EGD);  Surgeon: Carl E Gessner, MD;  Location: [facility];  Service: Endoscopy;  Laterality: N/A;  . ESOPHAGOGASTRODUODENOSCOPY N/A 09/10/2014   Procedure: ESOPHAGOGASTRODUODENOSCOPY (EGD);  Surgeon: Carl E Gessner, MD;  [facility]: [facility];  Service: Endoscopy;  Laterality: N/A;  . ESOPHAGOGASTRODUODENOSCOPY (EGD) WITH PROPOFOL N/A 08/21/2015   Procedure: ESOPHAGOGASTRODUODENOSCOPY (EGD) WITH PROPOFOL;  Surgeon: Carl E Gessner, MD;  Location: [facility];  Service: Endoscopy;  Laterality: N/A;  . ESOPHAGOGASTRODUODENOSCOPY (EGD) WITH PROPOFOL N/A 04/06/2017    Procedure: ESOPHAGOGASTRODUODENOSCOPY (EGD) WITH PROPOFOL;  Surgeon: Gessner, Carl E, MD;  Location: [facility];  Service: Endoscopy;  Laterality: N/A;  . ESOPHAGOGASTRODUODENOSCOPY W/ BANDING  06/26/2010   variceal ligation  . GASTRIC VARICES BANDING N/A 01/02/2013   Procedure: GASTRIC VARICES BANDING;  Surgeon: Carl E Gessner, MD;  Location: [facility];  Service: Endoscopy;  Laterality: N/A;  possible banding  . UPPER GASTROINTESTINAL ENDOSCOPY     Family History:  Family History  Problem Relation Age of Onset  . Hyperlipidemia Mother   . Hypertension Mother   . Breast cancer Mother        questionable  . Heart disease Mother   . Hypertension Father   . Irritable bowel syndrome Father   . Drug abuse Brother   . Heart disease Brother   . Breast cancer Maternal Aunt        maternal great aunt  . Alcohol abuse Other   . Heart disease Maternal Uncle   . Irritable bowel syndrome Paternal Aunt   . Colon cancer Neg Hx    Family Psychiatric  History: See admission H&P Social History:  Social History   Substance and Sexual Activity  Alcohol Use Yes   Comment: pt is an alcoholic currently in remission-AA meetings     Social History   Substance and Sexual Activity  Drug Use No    Social History   Socioeconomic History  . Marital status: Single    Spouse name: Not on file  . Number of children: 0  . Years of education: Not on   file  . Highest education level: Not on file  Occupational History  . Occupation: disability  Social Needs  . Financial resource strain: Not on file  . Food insecurity:    Worry: Not on file    Inability: Not on file  . Transportation needs:    Medical: Not on file    Non-medical: Not on file  Tobacco Use  . Smoking status: Former Smoker    Packs/day: 0.10    Years: 10.00    Pack years: 1.00    Types: Cigarettes    Start date: 03/03/2014    Last attempt to quit: 04/03/2014    Years since quitting: 3.5  . Smokeless tobacco: Never Used  .  Tobacco comment: Chews nicorette gum.  Substance and Sexual Activity  . Alcohol use: Yes    Comment: pt is an alcoholic currently in [employer]  . Drug use: No  . Sexual activity: Yes    Partners: Male    Birth control/protection: Condom    Comment: pt. declined condoms  Lifestyle  . Physical activity:    Days per week: Not on file    Minutes per session: Not on file  . Stress: Not on file  Relationships  . Social connections:    Talks on phone: Not on file    Gets together: Not on file    Attends religious service: Not on file    Active member of club or organization: Not on file    Attends meetings of clubs or organizations: Not on file    Relationship status: Not on file  Other Topics Concern  . Not on file  Social History Narrative   Single, disabled hair stylist   Lives with parents in a home with a basement.  Parents do not like for him to go down stairs because they are steep.           Additional Social History:                         Sleep: Fair  Appetite:  Good  Current Medications: Current Facility-Administered Medications  Medication Dose Route Frequency Provider Last Rate Last Dose  . aMILoride (MIDAMOR) tablet 15 mg  15 mg Oral Daily Rankin, Shuvon B, NP   15 mg at 10/09/17 0753  . amitriptyline (ELAVIL) tablet 100 mg  100 mg Oral QHS Sharma Covert, MD      . benzocaine (ORAJEL) 10 % mucosal gel   Mouth/Throat BID PRN Lindon Romp A, NP      . diclofenac sodium (VOLTAREN) 1 % transdermal gel 2 g  2 g Topical QID Sharma Covert, MD      . dolutegravir (TIVICAY) tablet 50 mg  50 mg Oral Daily Rankin, Shuvon B, NP   50 mg at 10/09/17 0754  . emtricitabine-tenofovir AF (DESCOVY) 200-25 MG per tablet 1 tablet  1 tablet Oral Daily Rankin, Shuvon B, NP   1 tablet at 10/09/17 0753  . furosemide (LASIX) tablet 40 mg  40 mg Oral Daily Rankin, Shuvon B, NP   40 mg at 10/09/17 0754  . hydrOXYzine (ATARAX/VISTARIL) tablet 25 mg  25 mg  Oral Q4H PRN Cobos, Myer Peer, MD   25 mg at 10/08/17 2051  . ibuprofen (ADVIL,MOTRIN) tablet 400 mg  400 mg Oral Q6H PRN Sharma Covert, MD      . lactulose [facility]) 10 GM/15ML solution 20 g  20 g Oral TID Sharma Covert,  MD   20 g at 10/09/17 1303  . nicotine polacrilex (NICORETTE) gum 2 mg  2 mg Oral PRN ,  Lawson, MD   2 mg at 10/09/17 1305  . pantoprazole (PROTONIX) EC tablet 40 mg  40 mg Oral Daily Rankin, Shuvon B, NP   40 mg at 10/09/17 0754  . potassium chloride SA (K-DUR,KLOR-CON) CR tablet 40 mEq  40 mEq Oral BID Rankin, Shuvon B, NP   40 mEq at 10/09/17 0754  . rifaximin (XIFAXAN) tablet 550 mg  550 mg Oral BID Rankin, Shuvon B, NP   550 mg at 10/09/17 0753  . simethicone (MYLICON) chewable tablet 80 mg  80 mg Oral TID PRN Rankin, Shuvon B, NP        Lab Results:  Results for orders placed or performed during the hospital encounter of 10/05/17 (from the past 48 hour(s))  Ammonia     Status: Abnormal   Collection Time: 10/09/17  6:26 AM  Result Value Ref Range   Ammonia 50 (H) 9 - 35 umol/L    Comment: Performed at [facility], [address] [place], [place] [address]  Comprehensive metabolic panel     Status: Abnormal   Collection Time: 10/09/17  6:26 AM  Result Value Ref Range   Sodium 141 135 - 145 mmol/L   Potassium 4.6 3.5 - 5.1 mmol/L   Chloride 110 101 - 111 mmol/L   CO2 22 22 - 32 mmol/L   Glucose, Bld 75 65 - 99 mg/dL   BUN 13 6 - 20 mg/dL   Creatinine, Ser 1.02 0.61 - 1.24 mg/dL   Calcium 9.5 8.9 - 10.3 mg/dL   Total Protein 6.8 6.5 - 8.1 g/dL   Albumin 3.8 3.5 - 5.0 g/dL   AST 32 15 - 41 U/L   ALT 21 17 - 63 U/L   Alkaline Phosphatase 102 38 - 126 U/L   Total Bilirubin 1.6 (H) 0.3 - 1.2 mg/dL   GFR calc non Af Amer >60 >60 mL/min   GFR calc Af Amer >60 >60 mL/min    Comment: (NOTE) The eGFR has been calculated using the CKD EPI equation. This calculation has not been validated in all clinical situations. eGFR's  persistently <60 mL/min signify possible Chronic Kidney Disease.    Anion gap 9 5 - 15    Comment: Performed at [facility], [address] [place],  [address]    Blood Alcohol level:  Lab Results  Component Value Date   ETH <10 10/02/2017   ETH <5 12/02/2014    Metabolic Disorder Labs: No results found for: HGBA1C, MPG No results found for: PROLACTIN Lab Results  Component Value Date   CHOL 152 08/22/2017   TRIG 68 08/22/2017   HDL 56 08/22/2017   CHOLHDL 2.7 08/22/2017   VLDL 23 08/10/2016   LDLCALC 82 08/22/2017   LDLCALC 57 08/10/2016    Physical Findings: AIMS: Facial and Oral Movements Muscles of Facial Expression: None, normal Lips and Perioral Area: None, normal Jaw: None, normal Tongue: None, normal,Extremity Movements Upper (arms, wrists, hands, fingers): None, normal Lower (legs, knees, ankles, toes): None, normal, Trunk Movements Neck, shoulders, hips: None, normal, Overall Severity Severity of abnormal movements (highest score from questions above): None, normal Incapacitation due to abnormal movements: None, normal Patient's awareness of abnormal movements (rate only patient's report): No Awareness, Dental Status Current problems with teeth and/or dentures?: No Does patient usually wear dentures?: No  CIWA:      COWS:     Musculoskeletal: Strength & Muscle Tone: within normal limits Gait & Station: normal Patient leans: N/A  Psychiatric Specialty Exam: Physical Exam  Constitutional: He is oriented to person, place, and time. He appears well-developed and well-nourished.  HENT:  Head: Normocephalic and atraumatic.  Respiratory: Effort normal.  Neurological: He is alert and oriented to person, place, and time.    ROS  Blood pressure 133/69, pulse 79, temperature 97.9 F (36.6 C), temperature source Oral, resp. rate 20, height 5' 11" (1.803 m), weight (!) 147.9 kg (326 lb).Body mass index is 45.47 kg/m.  General  Appearance: Casual  Eye Contact:  Fair  Speech:  Clear and Coherent  Volume:  Normal  Mood:  Depressed  Affect:  Congruent  Thought Process:  Coherent  Orientation:  Full (Time, Place, and Person)  Thought Content:  Logical  Suicidal Thoughts:  No  Homicidal Thoughts:  No  Memory:  Immediate;   Fair  Judgement:  Fair  Insight:  Fair  Psychomotor Activity:  Normal  Concentration:  Concentration: Fair  Recall:  Good  Fund of Knowledge:  Fair  Language:  Fair  Akathisia:  No  Handed:  Right  AIMS (if indicated):     Assets:  Communication Skills Desire for Improvement Housing Resilience Social Support  ADL's:  Intact  Cognition:  WNL  Sleep:  Number of Hours: 3     Treatment Plan Summary: Daily contact with patient to assess and evaluate symptoms and progress in treatment, Medication management and Plan Patient seen and examined.  Patient is a 52 year old male with the above-stated past psychiatric history seen in follow-up.  His mood continues to slowly improve.  His cognition is improved as well.  His ammonia is down to 50.  His platelets were at 66,000 in the medical admission.  They seem to have been dropping over the last several months.  I will mention this to him so he can discuss it with his HIV doctors as well as gastroenterologist if related either to his HIV status or his liver status.  I am going to increase his amitriptyline up to 100 mg p.o. nightly.  Megan change any of his other sedating medications at this time.  Additionally he is complaining some arthritic pain in his hand, and rather than adding a medication that might drop his platelets further I am going to give him some Voltaren gel and see if that relieves some of his pain.  No other changes in his medications at this time.  Hopefully he will sleep better tonight and will be able to get him home tomorrow.  Sharma Covert, MD 10/09/2017, 1:31 PM

## 2017-10-09 NOTE — Plan of Care (Signed)
D: Pt denies SI/HI/AVH. Pt is pleasant and cooperative. Pt visible in the dayroom playing cards with peers and interacting appropriately.   A: Pt was offered support and encouragement. Pt was given scheduled medications. Pt was encourage to attend groups. Q 15 minute checks were done for safety.   R:Pt attends groups and interacts well with peers and staff. Pt is taking medication. Pt has no complaints.Pt receptive to treatment and safety maintained on unit.   Problem: Education: Goal: Mental status will improve Outcome: Progressing   Problem: Safety: Goal: Periods of time without injury will increase Outcome: Progressing   Problem: Self-Concept: Goal: Ability to disclose and discuss suicidal ideas will improve Outcome: Progressing

## 2017-10-09 NOTE — Progress Notes (Signed)
Adult Psychoeducational Group Note  Date:  10/09/2017 Time:  1320 Group Topic/Focus:  Coping With Mental Health Crisis:   The purpose of this group is to help patients identify strategies for coping with mental health crisis.  Group discusses possible causes of crisis and ways to manage them effectively.  Participation Level:  Active  Participation Quality:  Appropriate  Affect:  Appropriate  Cognitive:  Appropriate  Insight: Appropriate  Engagement in Group:  Engaged  Modes of Intervention:  Discussion  Additional Comments:    Brice Kossman L

## 2017-10-09 NOTE — Progress Notes (Signed)
Adult Psychoeducational Group Note  Date:  10/09/2017 Time:  9:46 PM  Group Topic/Focus:  Wrap-Up Group:   The focus of this group is to help patients review their daily goal of treatment and discuss progress on daily workbooks.  Participation Level:  Active  Participation Quality:  Appropriate  Affect:  Appropriate  Cognitive:  Alert and Oriented  Insight: Good  Engagement in Group:  Engaged  Modes of Intervention:  Discussion  Additional Comments:  Pt attended group this evening.  Milas Kocher 10/09/2017, 9:46 PM

## 2017-10-09 NOTE — Progress Notes (Signed)
Pt observed at this time bringing all his bags of belongings up to the main doors. Writer asked pt, "what are you doing with your bags", pt stated, "I'm waiting on my ride, so I can leave". Writer explained to the pt, for the fifth time that he was not discharging home today. Pt was previously made aware that he was not discharging home today by MD and Probation officer. Pt appears to have a difficult time processing information. MD is aware. Pt was previously administered Ativan due to increased anxiety and agitation about not discharging. Pt noted to be confused and requires frequent redirecting. Writer informed Dr. Mallie Darting again in regards to pt confusion and per MD. given another dose of Ativan.

## 2017-10-09 NOTE — [facility] Group Notes (Signed)
Adult Psychoeducational Group Note  Date:  10/09/2017 Time:  8:36 AM  Group Topic/Focus:  Goals Group:   The focus of this group is to help patients establish daily goals to achieve during treatment and discuss how the patient can incorporate goal setting into their daily lives to aide in recovery.  Participation Level:  Active  Participation Quality:  Appropriate  Affect:  Appropriate  Cognitive:  Appropriate  Insight: Appropriate  Engagement in Group:  Engaged  Modes of Intervention:  Orientation  Additional Comments:  Pt attended and participated in orientation/goals group. Pt goal for today is to talk with doctor about his hand. Pt has been experiencing pain in his fingers.   Huel Cote 10/09/2017, 8:36 AM

## 2017-10-09 NOTE — Progress Notes (Signed)
Pt presents with a flat affect and depressed mood. Pt rates depression 5/10. Anxiety 9/10. Pt denies SI/HI. Pt reports difficulty sleeping last night. Per prior Rn report, pt slept for 3 hours. Pt stated that he was given a little green pill last night that was helpful. Pt c/o pain to his right hand, two middle fingers. MD made aware of pt's complaint. Pt reports tolerating meds well and denies any side effects. Orders reviewed with pt. Verbal support provided. Pt encouraged to attend groups. 15 minute checks performed for safety. Pt compliant with tx plan.

## 2017-10-10 LAB — AMMONIA: Ammonia: 65 umol/L — ABNORMAL HIGH (ref 9–35)

## 2017-10-10 MED ORDER — RISPERIDONE 0.5 MG PO TABS
0.5000 mg | ORAL_TABLET | Freq: Every day | ORAL | Status: DC
  Filled 2017-10-10 (×2): qty 1

## 2017-10-10 MED ORDER — QUETIAPINE FUMARATE 50 MG PO TABS
50.0000 mg | ORAL_TABLET | Freq: Every day | ORAL | Status: DC
  Administered 2017-10-10 – 2017-10-20 (×11): 50 mg via ORAL
  Filled 2017-10-10 (×16): qty 1

## 2017-10-10 MED ORDER — AMITRIPTYLINE HCL 25 MG PO TABS
75.0000 mg | ORAL_TABLET | Freq: Every day | ORAL | Status: DC
  Administered 2017-10-10: 75 mg via ORAL
  Filled 2017-10-10 (×3): qty 3

## 2017-10-10 MED ORDER — QUETIAPINE FUMARATE 25 MG PO TABS: 25.0000 mg | ORAL_TABLET | Freq: Three times a day (TID) | ORAL | Status: DC | PRN

## 2017-10-10 MED ORDER — RISPERIDONE 0.5 MG PO TABS
0.5000 mg | ORAL_TABLET | Freq: Once | ORAL | Status: AC
  Administered 2017-10-10: 0.5 mg via ORAL
  Filled 2017-10-10 (×2): qty 1

## 2017-10-10 MED ORDER — LORAZEPAM 0.5 MG PO TABS
0.5000 mg | ORAL_TABLET | Freq: Three times a day (TID) | ORAL | Status: DC | PRN
  Administered 2017-10-10 – 2017-10-11 (×2): 0.5 mg via ORAL
  Filled 2017-10-10 (×2): qty 1

## 2017-10-10 NOTE — Progress Notes (Signed)
Pt continues to present as confused this evening, pt comes to the nursing station asking what he needs to do, pt informed that it was night time and he needed to go to sleep. Pt makes references to taking his TV out his room and continues to state" I just don't want to mess up".

## 2017-10-10 NOTE — Plan of Care (Signed)
  Problem: Education: Goal: Emotional status will improve Outcome: Progressing   Problem: Education: Goal: Emotional status will improve Outcome: Not Progressing Goal: Mental status will improve Outcome: Not Progressing Goal: Verbalization of understanding the information provided will improve Outcome: Not Progressing   Problem: Education: Goal: Ability to make informed decisions regarding treatment will improve Outcome: Not Progressing   Problem: Education: Goal: Mental status will improve Outcome: Not Progressing

## 2017-10-10 NOTE — Progress Notes (Signed)
1:1 note:  Pt was placed on 1:1 for safety d/t confusion shortly before the end of day shift.  At this time, he is in bed, but not yet asleep.  He went to bed in the nude, and the male sitter was switched out for a male sitter.  He is still presenting with mild confusion. He took his meds without incident.  He has been cooperative with staff.  Sitter at pt's bedside.  Support and encouragement offered.  Discharge plans are in process.  Safety is maintained at this time.

## 2017-10-10 NOTE — Progress Notes (Signed)
Recreation Therapy Notes  Date: 4.29.19 Time: 0930 Location: [address] Dayroom  Group Topic: Stress Management  Goal Area(s) Addresses:  Patient will verbalize importance of using healthy stress management.  Patient will identify positive emotions associated with healthy stress management.   Intervention: Stress Management  Activity :  Body Scan Meditation.  LRT introduced the stress management technique of meditation to patients.  LRT played a meditation that focused on identifying any feeling of tension and stress in the body.  Patients were to follow along as the meditation played.  Education:  Stress Management, Discharge Planning.   Education Outcome: Acknowledges edcuation/In group clarification offered/Needs additional education  Clinical Observations/Feedback: Pt did not attend group.    [name], LRT/CTRS         [name] A 10/10/2017 12:29 PM

## 2017-10-10 NOTE — [facility] Group Notes (Signed)
Patient brother drop of $10 dollars for Patient. There where two five dollar bills.

## 2017-10-10 NOTE — [facility] Group Notes (Signed)
[facility] LCSW Group Therapy Note  Date/Time: 10/10/17, 1315  Type of Therapy and Topic:  Group Therapy:  Overcoming Obstacles  Participation Level:  Did not attend  Description of Group:    In this group patients will be encouraged to explore what they see as obstacles to their own wellness and recovery. They will be guided to discuss their thoughts, feelings, and behaviors related to these obstacles. The group will process together ways to cope with barriers, with attention given to specific choices patients can make. Each patient will be challenged to identify changes they are motivated to make in order to overcome their obstacles. This group will be process-oriented, with patients participating in exploration of their own experiences as well as giving and receiving support and challenge from other group members.  Therapeutic Goals: 1. Patient will identify personal and current obstacles as they relate to admission. 2. Patient will identify barriers that currently interfere with their wellness or overcoming obstacles.  3. Patient will identify feelings, thought process and behaviors related to these barriers. 4. Patient will identify two changes they are willing to make to overcome these obstacles:    Summary of Patient Progress      Therapeutic Modalities:   Cognitive Behavioral Therapy Solution Focused Therapy Motivational Interviewing Relapse Prevention Therapy  Lurline Idol, LCSW

## 2017-10-10 NOTE — Progress Notes (Signed)
[name] Progress Note  10/10/2017 10:33 AM [name]  MRN:  [id number] Subjective: Patient is seen and examined.  Patient is a 52 year old male with a past psychiatric history significant for major depression as well as alcohol use disorder.  He also has a history of HIV as well as hepatic cirrhosis.  He is seen in follow-up.  He was more confused in the p.m. yesterday, and I called several family member several times and told him that he was being discharged.  We monitor him overnight.  He seems to be a little bit more collected today, but still confused.  He did not know where his room was.  There was concerned that this might be related to withdrawal from benzodiazepines given his history.  I was concerned about previous neural imaging.  His CT scans have been repeated x2 over the last several months and there was nothing there that showed anything significant.  The more worrisome finding was an MRI done in December which showed consistent changes with Warnicke's encephalopathy.  That would make complete sense given the potential for more alcohol withdrawal problems.  He still having difficulty with the year, but knows the month and date.  I did speak with his brother this morning and last night.  This morning he stated after he had visited him last night that his cognition was definitely less than it had been prior to the hospitalization.  I have discussed with social work and nursing attempting to get home health care nursing given the fact that it will be him and his 82+-year-old father in the home.  His brother stated that his father does have some memory issues.  We repeated his ammonia this morning, and it remains at 50. Principal Problem: <principal problem not specified> Diagnosis:   Patient Active Problem List   Diagnosis Date Noted  . MDD (major depressive disorder), recurrent severe, without psychosis ([place]) [F33.2]   . Alcoholism ([place]) [F10.20] 10/03/2017  . Obesity, Class III, BMI  40-49.9 (morbid obesity) ([place]) [E66.01] 10/03/2017  . Hepatic encephalopathy ([place]) [K72.90] 10/02/2017  . Suicidal ideation [R45.851]   . Secondary esophageal varices with bleeding ([facility]) [I85.11]   . Venous stasis [I87.8] 03/02/2016  . Secondary esophageal varices without bleeding ([facility]) [I85.10]   . Erectile dysfunction [N52.9] 07/14/2015  . Encephalopathy, hepatic ([place]) [K72.90] 01/23/2015  . Peripheral edema [R60.9] 12/31/2014  . Constipation [K59.00] 07/15/2014  . Fall [W19.XXXA] 01/07/2014  . Orthostatic hypotension [I95.1] 05/28/2013  . Personal history of failed moderate sedation- MUST HAVE MAC OR GENERAL [Z92.83] 05/01/2013  . Portal hypertensive gastropathy ([place]) [K76.6, K31.89] 01/02/2013  . Acquired pancytopenia ([place]) [X91.478] 05/19/2011  . Insomnia [G47.00] 12/21/2010  . Alcoholic cirrhosis of liver ([place]) [K70.30] 07/15/2010  . Idiopathic peripheral autonomic neuropathy [G90.09] 05/25/2007  . Idiopathic peripheral autonomic neuropathy, unspecified [G90.09] 05/25/2007  . SINUSITIS, CHRONIC MAXILLARY [J32.0] 03/06/2007  . Human immunodeficiency virus (HIV) disease ([place]) [B20] 06/23/2006  . HYPERLIPIDEMIA [E78.5] 06/23/2006  . Anxiety state [F41.1] 06/23/2006  . Depression [F32.9] 06/23/2006  . HYPERTENSION [I10] 06/23/2006  . Reflux esophagitis [K21.0] 06/23/2006   Total Time spent with patient: 30 minutes  Past Psychiatric History: See admission H&P  Past Medical History:  Past Medical History:  Diagnosis Date  . Alcoholic cirrhosis of liver ([place])   . Alcoholism, chronic ([place])   . ANXIETY 06/23/2006  . Ascites 11/13/2009  . Ascites   . Avulsion fracture of middle phalanges of  3rd/4th fingers left hand 01/07/2014  . Blood dyscrasia  HIV  . Cellulitis of right lower extremity 03/02/2016  . DEPRESSION 06/23/2006  . 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 Erectile dysfunction 07/14/2015  . 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 Gastric ulcer 06/2010  . GERD  (gastroesophageal reflux disease)   . HIV DISEASE 06/23/2006  . HYPERLIPIDEMIA 06/23/2006  . HYPERTENSION 06/23/2006   denies  . 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 Iron deficiency anemia   . Left foot infection   . Obesity (BMI 30-39.9)    BMI 34 kg/m^2  . Portal hypertensive gastropathy ([place]) 01/02/2013  . Recent shoulder injury    left shoulder/fell in parking lot/ no surgery/December 26, 2016  . SBP (spontaneous bacterial peritonitis) ([place]) 05/03/2011   Suspected by high leukocytes on paracentesis. Clinical scenario also compatible. November 2012 responded to Levaquin. Started on trimethoprim-sulfamethoxazole double strength daily for prophylaxis.   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 Skin cancer    left calf  . STRAIN, CHEST WALL 03/15/2007  . Varices, esophageal ([place]) 06/2010  . Venous stasis 03/02/2016    Past Surgical History:  Procedure Laterality Date  . CARPAL TUNNEL RELEASE     left  . COLONOSCOPY  march 2013  . ESOPHAGEAL BANDING  05/01/2013   Procedure: ESOPHAGEAL BANDING;  Surgeon: Gatha Mayer, [name];  Location: [facility];  Service: Endoscopy;;  . ESOPHAGOGASTRODUODENOSCOPY  07/01/2010;  08/12/10   small varices, gastric ulcer  . ESOPHAGOGASTRODUODENOSCOPY  10/26/2011   Procedure: ESOPHAGOGASTRODUODENOSCOPY (EGD);  Surgeon: Gatha Mayer, [name];  Location: [facility];  Service: Endoscopy;  Laterality: N/A;  . ESOPHAGOGASTRODUODENOSCOPY N/A 01/02/2013   Procedure: ESOPHAGOGASTRODUODENOSCOPY (EGD);  Surgeon: Gatha Mayer, [name];  Location: [facility];  Service: Endoscopy;  Laterality: N/A;  . ESOPHAGOGASTRODUODENOSCOPY N/A 04/23/2013   Procedure: ESOPHAGOGASTRODUODENOSCOPY (EGD);  Surgeon: Gatha Mayer, [name];  Location: [facility];  Service: Endoscopy;  Laterality: N/A;  . ESOPHAGOGASTRODUODENOSCOPY N/A 05/01/2013   Procedure: ESOPHAGOGASTRODUODENOSCOPY (EGD);  Surgeon: Gatha Mayer, [name];  Location: [facility];  Service: Endoscopy;   Laterality: N/A;  follow-up varices and possibly band them  . ESOPHAGOGASTRODUODENOSCOPY N/A 09/04/2013   Procedure: ESOPHAGOGASTRODUODENOSCOPY (EGD);  Surgeon: Gatha Mayer, [name];  Location: [facility];  Service: Endoscopy;  Laterality: N/A;  . ESOPHAGOGASTRODUODENOSCOPY N/A 09/10/2014   Procedure: ESOPHAGOGASTRODUODENOSCOPY (EGD);  Surgeon: Gatha Mayer, [name];  Location: [facility];  Service: Endoscopy;  Laterality: N/A;  . ESOPHAGOGASTRODUODENOSCOPY (EGD) WITH PROPOFOL N/A 08/21/2015   Procedure: ESOPHAGOGASTRODUODENOSCOPY (EGD) WITH PROPOFOL;  Surgeon: Gatha Mayer, [name];  Location: [facility];  Service: Endoscopy;  Laterality: N/A;  . ESOPHAGOGASTRODUODENOSCOPY (EGD) WITH PROPOFOL N/A 04/06/2017   Procedure: ESOPHAGOGASTRODUODENOSCOPY (EGD) WITH PROPOFOL;  Surgeon: Gatha Mayer, [name];  Location: [facility];  Service: Endoscopy;  Laterality: N/A;  . ESOPHAGOGASTRODUODENOSCOPY W/ BANDING  06/26/2010   variceal ligation  . GASTRIC VARICES BANDING N/A 01/02/2013   Procedure: GASTRIC VARICES BANDING;  Surgeon: Gatha Mayer, [name];  Location: [facility];  Service: Endoscopy;  Laterality: N/A;  possible banding  . UPPER GASTROINTESTINAL ENDOSCOPY     Family History:  Family History  Problem Relation Age of Onset  . Hyperlipidemia Mother   . Hypertension Mother   . Breast cancer Mother        questionable  . Heart disease Mother   . Hypertension Father   . Irritable bowel syndrome Father   . Drug abuse Brother   . Heart disease Brother   . Breast cancer Maternal Aunt        maternal great aunt  .  Alcohol abuse Other   . Heart disease Maternal Uncle   . Irritable bowel syndrome Paternal Aunt   . Colon cancer Neg Hx    Family Psychiatric  History: See admission H&P Social History:  Social History   Substance and Sexual Activity  Alcohol Use Yes   Comment: pt is an alcoholic currently in [employer] meetings     Social History   Substance and Sexual Activity  Drug Use No     Social History   Socioeconomic History  . Marital status: Single    Spouse name: Not on file  . Number of children: 0  . Years of education: Not on file  . Highest education level: Not on file  Occupational History  . Occupation: disability  Social Needs  . Financial resource strain: Not on file  . Food insecurity:    Worry: Not on file    Inability: Not on file  . Transportation needs:    Medical: Not on file    Non-medical: Not on file  Tobacco Use  . Smoking status: Former Smoker    Packs/day: 0.10    Years: 10.00    Pack years: 1.00    Types: Cigarettes    Start date: 03/03/2014    Last attempt to quit: 04/03/2014    Years since quitting: 3.5  . Smokeless tobacco: Never Used  . Tobacco comment: Chews nicorette gum.  Substance and Sexual Activity  . Alcohol use: Yes    Comment: pt is an alcoholic currently in [employer]  . Drug use: No  . Sexual activity: Yes    Partners: Male    Birth control/protection: Condom    Comment: pt. declined condoms  Lifestyle  . Physical activity:    Days per week: Not on file    Minutes per session: Not on file  . Stress: Not on file  Relationships  . Social connections:    Talks on phone: Not on file    Gets together: Not on file    Attends religious service: Not on file    Active member of club or organization: Not on file    Attends meetings of clubs or organizations: Not on file    Relationship status: Not on file  Other Topics Concern  . Not on file  Social History Narrative   Single, disabled hair stylist   Lives with parents in a home with a basement.  Parents do not like for him to go down stairs because they are steep.           Additional Social History:                         Sleep: Fair  Appetite:  Fair  Current Medications: Current Facility-Administered Medications  Medication Dose Route Frequency Provider Last Rate Last Dose  . aMILoride (MIDAMOR) tablet 15 mg  15 mg Oral  Daily Rankin, Shuvon B, NP   15 mg at 10/10/17 0817  . amitriptyline (ELAVIL) tablet 100 mg  100 mg Oral QHS Sharma Covert, [name]   100 mg at 10/09/17 2207  . benzocaine (ORAJEL) 10 % mucosal gel   Mouth/Throat BID PRN Lindon Romp A, NP      . diclofenac sodium (VOLTAREN) 1 % transdermal gel 2 g  2 g Topical QID Sharma Covert, [name]   2 g at 10/10/17 0816  . dolutegravir (TIVICAY) tablet 50 mg  50 mg Oral Daily Rankin, Shuvon B, NP  50 mg at 10/10/17 0818  . emtricitabine-tenofovir AF (DESCOVY) 200-25 MG per tablet 1 tablet  1 tablet Oral Daily Rankin, Shuvon B, NP   1 tablet at 10/10/17 0818  . furosemide (LASIX) tablet 40 mg  40 mg Oral Daily Rankin, Shuvon B, NP   40 mg at 10/10/17 0817  . hydrOXYzine (ATARAX/VISTARIL) tablet 25 mg  25 mg Oral Q4H PRN Cobos, Myer Peer, [name]   25 mg at 10/08/17 2051  . ibuprofen (ADVIL,MOTRIN) tablet 400 mg  400 mg Oral Q6H PRN Sharma Covert, [name]      . lactulose ([place]) 10 GM/15ML solution 20 g  20 g Oral TID Sharma Covert, [name]   20 g at 10/10/17 0816  . LORazepam (ATIVAN) tablet 0.5 mg  0.5 mg Oral Q6H PRN Sharma Covert, [name]   0.5 mg at 10/10/17 0128  . nicotine polacrilex (NICORETTE) gum 2 mg  2 mg Oral PRN Sharma Covert, [name]   2 mg at 10/09/17 2208  . pantoprazole (PROTONIX) EC tablet 40 mg  40 mg Oral Daily Rankin, Shuvon B, NP   40 mg at 10/10/17 0817  . potassium chloride SA (K-DUR,KLOR-CON) CR tablet 40 mEq  40 mEq Oral BID Rankin, Shuvon B, NP   40 mEq at 10/10/17 0817  . rifaximin (XIFAXAN) tablet 550 mg  550 mg Oral BID Rankin, Shuvon B, NP   550 mg at 10/10/17 0818  . simethicone (MYLICON) chewable tablet 80 mg  80 mg Oral TID PRN Rankin, Shuvon B, NP        Lab Results:  Results for orders placed or performed during the hospital encounter of 10/05/17 (from the past 48 hour(s))  Ammonia     Status: Abnormal   Collection Time: 10/09/17  6:26 AM  Result Value Ref Range   Ammonia 50 (H) 9 - 35 umol/L    Comment: Performed  at [facility] [place] [address], [place], [place] [address]  Comprehensive metabolic panel     Status: Abnormal   Collection Time: 10/09/17  6:26 AM  Result Value Ref Range   Sodium 141 135 - 145 mmol/L   Potassium 4.6 3.5 - 5.1 mmol/L   Chloride 110 101 - 111 mmol/L   CO2 22 22 - 32 mmol/L   Glucose, Bld 75 65 - 99 mg/dL   BUN 13 6 - 20 mg/dL   Creatinine, Ser 1.02 0.61 - 1.24 mg/dL   Calcium 9.5 8.9 - 10.3 mg/dL   Total Protein 6.8 6.5 - 8.1 g/dL   Albumin 3.8 3.5 - 5.0 g/dL   AST 32 15 - 41 U/L   ALT 21 17 - 63 U/L   Alkaline Phosphatase 102 38 - 126 U/L   Total Bilirubin 1.6 (H) 0.3 - 1.2 mg/dL   GFR calc non Af Amer >60 >60 mL/min   GFR calc Af Amer >60 >60 mL/min    Comment: (NOTE) The eGFR has been calculated using the CKD EPI equation. This calculation has not been validated in all clinical situations. eGFR's persistently <60 mL/min signify possible Chronic Kidney Disease.    Anion gap 9 5 - 15    Comment: Performed at [facility], [place] [address], [place], [place] [address]  Ammonia     Status: Abnormal   Collection Time: 10/10/17  6:29 AM  Result Value Ref Range   Ammonia 65 (H) 9 - 35 umol/L    Comment: Performed at [facility], [address] [place], [place] [address]    Blood Alcohol level:  Lab Results  Component Value Date   ETH <10 10/02/2017   ETH <5 60/45/4098    Metabolic Disorder Labs: No results found for: HGBA1C, MPG No results found for: PROLACTIN Lab Results  Component Value Date   CHOL 152 08/22/2017   TRIG 68 08/22/2017   HDL 56 08/22/2017   CHOLHDL 2.7 08/22/2017   VLDL 23 08/10/2016   LDLCALC 82 08/22/2017   LDLCALC 57 08/10/2016    Physical Findings: AIMS: Facial and Oral Movements Muscles of Facial Expression: None, normal Lips and Perioral Area: None, normal Jaw: None, normal Tongue: None, normal,Extremity Movements Upper (arms, wrists, hands, fingers): None,  normal Lower (legs, knees, ankles, toes): None, normal, Trunk Movements Neck, shoulders, hips: None, normal, Overall Severity Severity of abnormal movements (highest score from questions above): None, normal Incapacitation due to abnormal movements: None, normal Patient's awareness of abnormal movements (rate only patient's report): No Awareness, Dental Status Current problems with teeth and/or dentures?: No Does patient usually wear dentures?: No  CIWA:    COWS:     Musculoskeletal: Strength & Muscle Tone: within normal limits Gait & Station: shuffle Patient leans: N/A  Psychiatric Specialty Exam: Physical Exam  Constitutional: He appears well-developed and well-nourished.  HENT:  Head: Normocephalic and atraumatic.  Neurological: He is alert.    ROS  Blood pressure (!) 154/77, pulse 87, temperature 97.6 F (36.4 C), temperature source Oral, resp. rate 16, height _0  (1.803 m), weight (!) 147.9 kg (326 lb).Body mass index is 45.47 kg/m.  General Appearance: Casual  Eye Contact:  Fair  Speech:  Normal Rate  Volume:  Decreased  Mood:  Euthymic  Affect:  Congruent  Thought Process:  Goal Directed  Orientation:  Other:  Alert and oriented to time except for a year.  He was alert and oriented to place.  Thought Content:  No psychosis, but confused  Suicidal Thoughts:  No  Homicidal Thoughts:  No  Memory:  Immediate;   Fair  Judgement:  Impaired  Insight:  Fair  Psychomotor Activity:  Shuffling Gait  Concentration:  Concentration: Fair  Recall:  [employer] of Knowledge:  Fair  Language:  Fair  Akathisia:  No  Handed:  Right  AIMS (if indicated):     Assets:  Desire for Improvement Social Support  ADL's:  Intact  Cognition:  WNL  Sleep:  Number of Hours: 5.5     Treatment Plan Summary: Daily contact with patient to assess and evaluate symptoms and progress in treatment, Medication management and Plan Patient is seen and examined.  Patient is a 52 year old male  with the above-stated past psychiatric history seen in follow-up.  I did not see the results of the December 2018 MRI until this morning.  It shows some changes that are consistent with Warneke's encephalopathy.  That would explain a lot of his cognitive issues currently.  It may have been that his most recent alcohol binge led to worsening of his encephalopathy.  As well, he does have hepatic changes, and with his platelets dropping I would assume his liver function is getting worse.  His ammonia is at 50.  He is not suicidal or depressed right now.  Given his cognitive issues I think we should at least attempt to get home health care nursing to be able to go into the home and make sure it safe between him and his father.  His brother lives down the  road from him, and has been helping out for a while but the mother was clearly the caretaker in the home.  We will try and minimize the benzodiazepines today, and monitor for any kind of improvement.  No other changes in his medications currently.  Sharma Covert, [name] 10/10/2017, 10:33 AM

## 2017-10-10 NOTE — Progress Notes (Signed)
Adult Psychoeducational Group Note  Date:  10/10/2017 Time:  1:29 PM  Group Topic/Focus:  Goals Group:   The focus of this group is to help patients establish daily goals to achieve during treatment and discuss how the patient can incorporate goal setting into their daily lives to aide in recovery.  Participation Level:  Minimal  Participation Quality:  Inattentive  Affect:  Flat  Cognitive:  Confused  Insight: None  Engagement in Group:  Lacking  Modes of Intervention:  Discussion  Additional Comments:  Pt did come to group but left early.  Pt was not very focused on group and has been confused the majority of the day so far.  [name] R Bingham [place] 10/10/2017, 1:29 PM

## 2017-10-10 NOTE — Progress Notes (Signed)
1:1 observation note:  Patient moved from 400 to 500 hall due to confusion and needing direction from staff.  Patient had removed his bottoms and walked out in the hall naked.  He also was wandering over to the 300 hall.  Patient keeps stating, "I'm going to call my brother to come get me."  Patient has been informed several times that he is not being discharged.  He denies any thoughts of self harm.  He has a PT consult ordered before d/c and MD has requested a home health consult prior to discharge.  Patient is redirectable and pleasant.  He remains confused and is not processing his thoughts well.

## 2017-10-11 DIAGNOSIS — R451 Restlessness and agitation: Secondary | ICD-10-CM

## 2017-10-11 DIAGNOSIS — T1491XA Suicide attempt, initial encounter: Secondary | ICD-10-CM

## 2017-10-11 DIAGNOSIS — T510X2A Toxic effect of ethanol, intentional self-harm, initial encounter: Secondary | ICD-10-CM

## 2017-10-11 DIAGNOSIS — E876 Hypokalemia: Secondary | ICD-10-CM

## 2017-10-11 DIAGNOSIS — T424X2A Poisoning by benzodiazepines, intentional self-harm, initial encounter: Secondary | ICD-10-CM

## 2017-10-11 DIAGNOSIS — K219 Gastro-esophageal reflux disease without esophagitis: Secondary | ICD-10-CM

## 2017-10-11 DIAGNOSIS — F04 Amnestic disorder due to known physiological condition: Secondary | ICD-10-CM

## 2017-10-11 DIAGNOSIS — F1020 Alcohol dependence, uncomplicated: Secondary | ICD-10-CM

## 2017-10-11 DIAGNOSIS — I10 Essential (primary) hypertension: Secondary | ICD-10-CM

## 2017-10-11 DIAGNOSIS — G4700 Insomnia, unspecified: Secondary | ICD-10-CM

## 2017-10-11 HISTORY — DX: Alcohol dependence, uncomplicated: F10.20

## 2017-10-11 MED ORDER — ESCITALOPRAM OXALATE 5 MG PO TABS
5.0000 mg | ORAL_TABLET | Freq: Every day | ORAL | Status: DC
  Administered 2017-10-11 – 2017-10-21 (×11): 5 mg via ORAL
  Filled 2017-10-11 (×14): qty 1

## 2017-10-11 MED ORDER — HYDROXYZINE HCL 50 MG PO TABS
50.0000 mg | ORAL_TABLET | Freq: Four times a day (QID) | ORAL | Status: DC | PRN
  Administered 2017-10-11 – 2017-10-19 (×6): 50 mg via ORAL
  Filled 2017-10-11 (×5): qty 1

## 2017-10-11 MED ORDER — TRAZODONE HCL 50 MG PO TABS
50.0000 mg | ORAL_TABLET | Freq: Every evening | ORAL | Status: DC | PRN
  Administered 2017-10-11 – 2017-10-12 (×2): 50 mg via ORAL
  Filled 2017-10-11 (×2): qty 1

## 2017-10-11 NOTE — Progress Notes (Signed)
Recreation Therapy Notes  Date: 4.30.19 Time: 1000 Location: 500 Hall Dayroom  Group Topic: Self-Esteem  Goal Area(s) Addresses:  Patient will successfully identify positive attributes about themselves.  Patient will successfully identify benefit of improved self-esteem.   Behavioral Response: Minimal  Intervention: Magazines, colored pencils, markers, scissors, glue  Activity: Advertisement.  Patients were to create an advertisement highlighting their positive qualities.  Education:  Self-Esteem, Dentist.   Education Outcome: Acknowledges education/In group clarification offered/Needs additional education  Clinical Observations/Feedback: Pt was confused.  Pt stated talking about his morning and how he wanted to prove to the government he wasn't wasting tax payer money being here.    [name], LRT/CTRS         Ria Comment, Arhaan Chesnut A 10/11/2017 11:59 AM

## 2017-10-11 NOTE — Progress Notes (Signed)
[facility] MD Progress Note  10/11/2017 2:59 PM [name]  MRN:  [id number] Subjective:    [name] is a 52 y/o M with history of MDD, alcohol use disorder, HIV, hepatic cirrhosis, and recently elevated ammonia with hepatic encephalopathy who was admitted after intentional overdose of xanax and alcohol. Pt was found to have elevated ammonia, and it remains elevated during this admission despite trending down with use of lactulose. He was transferred to [facility] for additional treatment and stabilization of his psychiatric symptoms. He was continued on home medication of amitriptyline and monitored on the inpatient unit. Pt has been reporting improvement of his mood symptoms; however, he has episodic confusion and disorientation. Yesterday evening he disrobed and was confused, but he was able to be redirected. Due to his behaviors he was transferred to the 500 hall.  Today upon evaluation, pt reports he is doing well. He is somewhat circumstantial with his responses, and responds tangentially at times when asked directly about orientation questions. He is oriented to season and year. He is unsure of the date, month, and day of week. He is oriented that he is at [facility]. Pt was asked about his reasons for admission, and he shares, "I was already grieving when I lost my mother, and then I was devastated." Pt endorses that he had SI prior to admission, but he has poor recollection if he attempted an overdose. He denies current SI/HI/AH/VH. He reports that his hospitalization has been helpful, and he would like to return to living at home with his father after discharge. Pt is open to referral to home health care. Discussed with patient about reducing all medication which may cause sedation, and pt is in agreement to discontinue ativan (vistaril will be continued for PRN treatment of anxiety). Pt agrees to discontinue amitriptyline and instead attempt trial of lexapro. We will also make trazodone available as PRN  for sleep as we are discontinuing amitriptyline. Pt was in agreement with the above plan, and he had no further questions, comments, or concerns.  Principal Problem: MDD (major depressive disorder), recurrent severe, without psychosis ([place]) Diagnosis:   Patient Active Problem List   Diagnosis Date Noted  . Alcohol use disorder, severe, dependence ([place]) [F10.20] 10/11/2017  . MDD (major depressive disorder), recurrent severe, without psychosis ([place]) [F33.2]   . Alcoholism ([place]) [F10.20] 10/03/2017  . Obesity, Class III, BMI 40-49.9 (morbid obesity) ([place]) [E66.01] 10/03/2017  . Hepatic encephalopathy ([place]) [K72.90] 10/02/2017  . Suicidal ideation [R45.851]   . Secondary esophageal varices with bleeding (HCC) [I85.11]   . Venous stasis [I87.8] 03/02/2016  . Secondary esophageal varices without bleeding (HCC) [I85.10]   . Erectile dysfunction [N52.9] 07/14/2015  . Encephalopathy, hepatic ([place]) [K72.90] 01/23/2015  . Peripheral edema [R60.9] 12/31/2014  . Constipation [K59.00] 07/15/2014  . Fall [W19.XXXA] 01/07/2014  . Orthostatic hypotension [I95.1] 05/28/2013  . Personal history of failed moderate sedation- MUST HAVE MAC OR GENERAL [Z92.83] 05/01/2013  . Portal hypertensive gastropathy ([place]) [K76.6, K31.89] 01/02/2013  . Acquired pancytopenia ([place]) [F02.774] 05/19/2011  . Insomnia [G47.00] 12/21/2010  . Alcoholic cirrhosis of liver ([place]) [K70.30] 07/15/2010  . Idiopathic peripheral autonomic neuropathy [G90.09] 05/25/2007  . Idiopathic peripheral autonomic neuropathy, unspecified [G90.09] 05/25/2007  . SINUSITIS, CHRONIC MAXILLARY [J32.0] 03/06/2007  . Human immunodeficiency virus (HIV) disease ([place]) [B20] 06/23/2006  . HYPERLIPIDEMIA [E78.5] 06/23/2006  . Anxiety state [F41.1] 06/23/2006  . Depression [F32.9] 06/23/2006  . HYPERTENSION [I10] 06/23/2006  . Reflux esophagitis [K21.0] 06/23/2006   Total Time spent  with patient: 30 minutes  Past Psychiatric History: see H&P  Past  Medical History:  Past Medical History:  Diagnosis Date  . Alcoholic cirrhosis of liver ([place])   . Alcoholism, chronic ([place])   . ANXIETY 06/23/2006  . Ascites 11/13/2009  . Ascites   . Avulsion fracture of middle phalanges of  3rd/4th fingers left hand 01/07/2014  . Blood dyscrasia    HIV  . Cellulitis of right lower extremity 03/02/2016  . DEPRESSION 06/23/2006  . 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 Erectile dysfunction 07/14/2015  . 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 Gastric ulcer 06/2010  . GERD (gastroesophageal reflux disease)   . HIV DISEASE 06/23/2006  . HYPERLIPIDEMIA 06/23/2006  . HYPERTENSION 06/23/2006   denies  . 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 Iron deficiency anemia   . Left foot infection   . Obesity (BMI 30-39.9)    BMI 34 kg/m^2  . Portal hypertensive gastropathy ([place]) 01/02/2013  . Recent shoulder injury    left shoulder/fell in parking lot/ no surgery/December 26, 2016  . SBP (spontaneous bacterial peritonitis) ([place]) 05/03/2011   Suspected by high leukocytes on paracentesis. Clinical scenario also compatible. November 2012 responded to Levaquin. Started on trimethoprim-sulfamethoxazole double strength daily for prophylaxis.   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 Skin cancer    left calf  . STRAIN, CHEST WALL 03/15/2007  . Varices, esophageal ([place]) 06/2010  . Venous stasis 03/02/2016    Past Surgical History:  Procedure Laterality Date  . CARPAL TUNNEL RELEASE     left  . COLONOSCOPY  march 2013  . ESOPHAGEAL BANDING  05/01/2013   Procedure: ESOPHAGEAL BANDING;  Surgeon: Gatha Mayer, MD;  Location: [facility] ENDOSCOPY;  Service: Endoscopy;;  . ESOPHAGOGASTRODUODENOSCOPY  07/01/2010;  08/12/10   small varices, gastric ulcer  . ESOPHAGOGASTRODUODENOSCOPY  10/26/2011   Procedure: ESOPHAGOGASTRODUODENOSCOPY (EGD);  Surgeon: Gatha Mayer, MD;  Location: [facility];  Service: Endoscopy;  Laterality: N/A;  .  ESOPHAGOGASTRODUODENOSCOPY N/A 01/02/2013   Procedure: ESOPHAGOGASTRODUODENOSCOPY (EGD);  Surgeon: Gatha Mayer, MD;  Location: [facility];  Service: Endoscopy;  Laterality: N/A;  . ESOPHAGOGASTRODUODENOSCOPY N/A 04/23/2013   Procedure: ESOPHAGOGASTRODUODENOSCOPY (EGD);  Surgeon: Gatha Mayer, MD;  Location: [facility];  Service: Endoscopy;  Laterality: N/A;  . ESOPHAGOGASTRODUODENOSCOPY N/A 05/01/2013   Procedure: ESOPHAGOGASTRODUODENOSCOPY (EGD);  Surgeon: Gatha Mayer, MD;  Location: [facility];  Service: Endoscopy;  Laterality: N/A;  follow-up varices and possibly band them  . ESOPHAGOGASTRODUODENOSCOPY N/A 09/04/2013   Procedure: ESOPHAGOGASTRODUODENOSCOPY (EGD);  Surgeon: Gatha Mayer, MD;  Location: [facility];  Service: Endoscopy;  Laterality: N/A;  . ESOPHAGOGASTRODUODENOSCOPY N/A 09/10/2014   Procedure: ESOPHAGOGASTRODUODENOSCOPY (EGD);  Surgeon: Gatha Mayer, MD;  Location: [facility];  Service: Endoscopy;  Laterality: N/A;  . ESOPHAGOGASTRODUODENOSCOPY (EGD) WITH PROPOFOL N/A 08/21/2015   Procedure: ESOPHAGOGASTRODUODENOSCOPY (EGD) WITH PROPOFOL;  Surgeon: Gatha Mayer, MD;  Location: [facility];  Service: Endoscopy;  Laterality: N/A;  . ESOPHAGOGASTRODUODENOSCOPY (EGD) WITH PROPOFOL N/A 04/06/2017   Procedure: ESOPHAGOGASTRODUODENOSCOPY (EGD) WITH PROPOFOL;  Surgeon: Gatha Mayer, MD;  Location: [facility];  Service: Endoscopy;  Laterality: N/A;  . ESOPHAGOGASTRODUODENOSCOPY W/ BANDING  06/26/2010   variceal ligation  . GASTRIC VARICES BANDING N/A 01/02/2013   Procedure: GASTRIC VARICES BANDING;  Surgeon: Gatha Mayer, MD;  Location: [facility] ENDOSCOPY;  Service: Endoscopy;  Laterality: N/A;  possible banding  . UPPER GASTROINTESTINAL ENDOSCOPY     Family History:  Family History  Problem Relation Age of Onset  .  Hyperlipidemia Mother   . Hypertension Mother   . Breast cancer Mother        questionable  . Heart disease Mother   . Hypertension Father   .  Irritable bowel syndrome Father   . Drug abuse Brother   . Heart disease Brother   . Breast cancer Maternal Aunt        maternal great aunt  . Alcohol abuse Other   . Heart disease Maternal Uncle   . Irritable bowel syndrome Paternal Aunt   . Colon cancer Neg Hx    Family Psychiatric  History: see H&P Social History:  Social History   Substance and Sexual Activity  Alcohol Use Yes   Comment: pt is an alcoholic currently in [place] meetings     Social History   Substance and Sexual Activity  Drug Use No    Social History   Socioeconomic History  . Marital status: Single    Spouse name: Not on file  . Number of children: 0  . Years of education: Not on file  . Highest education level: Not on file  Occupational History  . Occupation: disability  Social Needs  . Financial resource strain: Not on file  . Food insecurity:    Worry: Not on file    Inability: Not on file  . Transportation needs:    Medical: Not on file    Non-medical: Not on file  Tobacco Use  . Smoking status: Former Smoker    Packs/day: 0.10    Years: 10.00    Pack years: 1.00    Types: Cigarettes    Start date: 03/03/2014    Last attempt to quit: 04/03/2014    Years since quitting: 3.5  . Smokeless tobacco: Never Used  . Tobacco comment: Chews nicorette gum.  Substance and Sexual Activity  . Alcohol use: Yes    Comment: pt is an alcoholic currently in [employer]  . Drug use: No  . Sexual activity: Yes    Partners: Male    Birth control/protection: Condom    Comment: pt. declined condoms  Lifestyle  . Physical activity:    Days per week: Not on file    Minutes per session: Not on file  . Stress: Not on file  Relationships  . Social connections:    Talks on phone: Not on file    Gets together: Not on file    Attends religious service: Not on file    Active member of club or organization: Not on file    Attends meetings of clubs or organizations: Not on file     Relationship status: Not on file  Other Topics Concern  . Not on file  Social History Narrative   Single, disabled hair stylist   Lives with parents in a home with a basement.  Parents do not like for him to go down stairs because they are steep.           Additional Social History:                         Sleep: Fair  Appetite:  Good  Current Medications: Current Facility-Administered Medications  Medication Dose Route Frequency Provider Last Rate Last Dose  . aMILoride (MIDAMOR) tablet 15 mg  15 mg Oral Daily Rankin, Shuvon B, NP   15 mg at 10/11/17 0803  . benzocaine (ORAJEL) 10 % mucosal gel   Mouth/Throat BID PRN Rozetta Nunnery, NP      .  diclofenac sodium (VOLTAREN) 1 % transdermal gel 2 g  2 g Topical QID Sharma Covert, MD   2 g at 10/11/17 1212  . dolutegravir (TIVICAY) tablet 50 mg  50 mg Oral Daily Rankin, Shuvon B, NP   50 mg at 10/11/17 0804  . emtricitabine-tenofovir AF (DESCOVY) 200-25 MG per tablet 1 tablet  1 tablet Oral Daily Rankin, Shuvon B, NP   1 tablet at 10/11/17 0804  . escitalopram (LEXAPRO) tablet 5 mg  5 mg Oral Daily Leahmarie Gasiorowski T, MD      . furosemide (LASIX) tablet 40 mg  40 mg Oral Daily Rankin, Shuvon B, NP   40 mg at 10/11/17 0804  . hydrOXYzine (ATARAX/VISTARIL) tablet 50 mg  50 mg Oral Q6H PRN Pennelope Bracken, MD      . ibuprofen (ADVIL,MOTRIN) tablet 400 mg  400 mg Oral Q6H PRN Sharma Covert, MD      . lactulose ([place]) 10 GM/15ML solution 20 g  20 g Oral TID Sharma Covert, MD   20 g at 10/11/17 1212  . nicotine polacrilex (NICORETTE) gum 2 mg  2 mg Oral PRN Sharma Covert, MD   2 mg at 10/10/17 1305  . pantoprazole (PROTONIX) EC tablet 40 mg  40 mg Oral Daily Rankin, Shuvon B, NP   40 mg at 10/11/17 0803  . potassium chloride SA (K-DUR,KLOR-CON) CR tablet 40 mEq  40 mEq Oral BID Rankin, Shuvon B, NP   40 mEq at 10/11/17 0803  . QUEtiapine (SEROQUEL) tablet 25 mg  25 mg Oral Q8H PRN Sharma Covert, MD      . QUEtiapine (SEROQUEL) tablet 50 mg  50 mg Oral QHS Sharma Covert, MD   50 mg at 10/10/17 2131  . rifaximin (XIFAXAN) tablet 550 mg  550 mg Oral BID Rankin, Shuvon B, NP   550 mg at 10/11/17 0804  . simethicone (MYLICON) chewable tablet 80 mg  80 mg Oral TID PRN Rankin, Shuvon B, NP      . traZODone (DESYREL) tablet 50 mg  50 mg Oral QHS PRN Pennelope Bracken, MD        Lab Results:  Results for orders placed or performed during the hospital encounter of 10/05/17 (from the past 48 hour(s))  Ammonia     Status: Abnormal   Collection Time: 10/10/17  6:29 AM  Result Value Ref Range   Ammonia 65 (H) 9 - 35 umol/L    Comment: Performed at [facility], [place] [address], [place], [place] [address]    Blood Alcohol level:  Lab Results  Component Value Date   ETH <10 10/02/2017   ETH <5 40/81/4481    Metabolic Disorder Labs: No results found for: HGBA1C, MPG No results found for: PROLACTIN Lab Results  Component Value Date   CHOL 152 08/22/2017   TRIG 68 08/22/2017   HDL 56 08/22/2017   CHOLHDL 2.7 08/22/2017   VLDL 23 08/10/2016   LDLCALC 82 08/22/2017   LDLCALC 57 08/10/2016    Physical Findings: AIMS: Facial and Oral Movements Muscles of Facial Expression: None, normal Lips and Perioral Area: None, normal Jaw: None, normal Tongue: None, normal,Extremity Movements Upper (arms, wrists, hands, fingers): None, normal Lower (legs, knees, ankles, toes): None, normal, Trunk Movements Neck, shoulders, hips: None, normal, Overall Severity Severity of abnormal movements (highest score from questions above): None, normal Incapacitation due to abnormal movements: None, normal Patient's awareness of abnormal movements (rate only patient's report):  No Awareness, Dental Status Current problems with teeth and/or dentures?: No Does patient usually wear dentures?: No  CIWA:    COWS:     Musculoskeletal: Strength & Muscle Tone: within  normal limits Gait & Station: normal Patient leans: N/A  Psychiatric Specialty Exam: Physical Exam  Nursing note and vitals reviewed.   Review of Systems  Constitutional: Negative for chills and fever.  Respiratory: Negative for cough and shortness of breath.   Cardiovascular: Negative for chest pain.  Gastrointestinal: Negative for abdominal pain, heartburn, nausea and vomiting.  Psychiatric/Behavioral: Negative for depression, hallucinations and suicidal ideas. The patient is not nervous/anxious and does not have insomnia.     Blood pressure (!) 154/77, pulse 87, temperature 97.6 F (36.4 C), temperature source Oral, resp. rate 16, height _0  (1.803 m), weight (!) 147.9 kg (326 lb).Body mass index is 45.47 kg/m.  General Appearance: Fairly Groomed  Eye Contact:  Good  Speech:  Clear and Coherent and Normal Rate  Volume:  Normal  Mood:  Euthymic  Affect:  Congruent and Constricted  Thought Process:  Disorganized and Goal Directed  Orientation:  Other:  not oriented to date/month/day of week. Oriented to year and season. Oriented to location.  Thought Content:  Tangential  Suicidal Thoughts:  No  Homicidal Thoughts:  No  Memory:  Immediate;   Fair Recent;   Poor Remote;   Poor  Judgement:  Poor  Insight:  Lacking  Psychomotor Activity:  Normal  Concentration:  Concentration: Fair  Recall:  Poor  Fund of Knowledge:  Poor  Language:  Fair  Akathisia:  No  Handed:    AIMS (if indicated):     Assets:  Housing Resilience Social Support  ADL's:  Intact  Cognition:  WNL  Sleep:  Number of Hours: 5.25   Treatment Plan Summary: Daily contact with patient to assess and evaluate symptoms and progress in treatment and Medication management   -Continue inpatient hospitalization  -MDD, recurrent, severe, without psychosis   -DC amitriptyline   - Start lexapro 69m po qDay   -Continue seroquel 532mpo qhs  -Anxiety  -DC ativan   -Change vistaril 2573mo q4h to vistaril  32m64m q6h prn anxiety  -Agitation   -Continue seroquel 25mg71mq8h prn agitation  -Insomnia   -Start trazodone 32mg 72mhs prn insomnia  -HIV   -Continue tivicay 32mg p37may   -Continue descovy 200-25mg pe80mblet, take 1 tablet daily  -Elevated ammonia   -Continue lactulose 20g TID  -Hepatic encephalopathy   -Continue rifaximin 532mg po 76m -HTN   -Continue amiloride 15mg po q90m  - Continue lasix 40mg po qD42m-hypokalemia   -Continue  Potassium chloride 40 mEq po BID  -GERD   -Continue protonix 40mg po qDa53mAcute pain   -Continue voltaren gel 1%, 2g apply topically to affected area QID  -Encourage participation in groups and therapeutic milieu  -Disposition planning will be ongoing   Mataya Kilduff Pennelope Bracken19, 2:59 PM

## 2017-10-11 NOTE — Progress Notes (Signed)
Did not attend group 

## 2017-10-11 NOTE — Progress Notes (Signed)
[place] [facility] 1:1 Observation Documentation  For the first (8) hours following discontinuation of 1:1 precautions, a progress note entry by nursing staff should be documented at least every 2 hours, reflecting the patient's behavior, condition, mood, and conversation.  Use the progress notes for additional entries.  Time 1:1 discontinued:  09 AM  Patient's Behavior: Patient visible in milieu watching TV.   Patient's Condition: Patient is alert and oriented to person.   Patient's Conversation: Minimal interaction with staff.   [name] Straw 10/11/2017, 7:04 PM

## 2017-10-11 NOTE — Progress Notes (Signed)
[place] [facility] 1:1 Observation Documentation  For the first (8) hours following discontinuation of 1:1 precautions, a progress note entry by nursing staff should be documented at least every 2 hours, reflecting the patient's behavior, condition, mood, and conversation.  Use the progress notes for additional entries.  Time 1:1 discontinued:  09 AM  Patient's Behavior: Patient is visible in milieu.  Calm and cooperative.   Patient's Condition:  No behavioral issues noted.  Patient in the dayroom watching TV.  Patient's Conversation: No interaction with staff or peers.   Coralyn Mark Rontavious Albright 10/11/2017, 1:19 PM

## 2017-10-11 NOTE — Progress Notes (Signed)
[facility] 1:1 Observation Documentation  For the first (8) hours following discontinuation of 1:1 precautions, a progress note entry by nursing staff should be documented at least every 2 hours, reflecting the patient's behavior, condition, mood, and conversation.  Use the progress notes for additional entries.  Time 1:1 discontinued:  09 AM  Patient's Behavior: Patient is disorganized, confused and needs a lot of redirection on the unit.   Patient's Condition:  Patient is calm and cooperative on the unit.  Patient's Conversation: minimal interaction with staff and tangential thoughts process.   [name] 10/11/2017, 1:05 PM

## 2017-10-11 NOTE — [facility] Group Notes (Signed)
[place] LCSW Group Therapy 10/11/2017 1:15pm  Type of Therapy: Group Therapy- Feelings Around Discharge & Establishing a Supportive Framework  Participation Level:  Active  Description of Group:   What is a supportive framework? What does it look like feel like and how do I discern it from and unhealthy non-supportive network? Learn how to cope when supports are not helpful and don't support you. Discuss what to do when your family/friends are not supportive.  Summary of Patient Progress Stayed the entire time, appeared engaged.  Offered nothing without being invited, but rambled on for quite awhile once he got started.  Talked about losing his mother recently, but was more focused on why he can't get his license back after a DUI than anything else. Cited a brother as supportive.  Therapeutic Modalities:   Cognitive Behavioral Therapy Person-Centered Therapy Motivational Interviewing   Trish Mage, [place] 10/11/2017 3:27 PM

## 2017-10-11 NOTE — Progress Notes (Signed)
Pt is awake and fidgety in bed.  He thinks it is later than 0230 and was going to get up, but sitter told him it is still the middle of the night.  Pt was given Ativan 0.5 mg and Vistaril 25 mg prn to relax and go back to sleep.  Pt was cooperative with taking the medication.  Continue 1:1 for safety.  Sitter at bedside.  Pt is safe at this time.

## 2017-10-11 NOTE — Progress Notes (Signed)
[place] [facility] 1:1 Observation Documentation  For the first (8) hours following discontinuation of 1:1 precautions, a progress note entry by nursing staff should be documented at least every 2 hours, reflecting the patient's behavior, condition, mood, and conversation.  Use the progress notes for additional entries.  Time 1:1 discontinued:  09 AM  Patient's Behavior:  Patient outside in the courtyard for recreation.  Patient's Condition: Patient is calm and cooperative.   Patient's Conversation: No interaction with staff or peers.   [name] 10/11/2017, 4:36 PM

## 2017-10-11 NOTE — Progress Notes (Signed)
1:1 progress note:  Pt is up at this time getting ready for the day.  He says he was able to go back to sleep after taking the Ativan and Vistaril at 0244.  He voices no complaints at this time.  He continues on 1:1 for safety d/t episodes of confusion and unsteadiness.  Sitter with patient.  Pt remains safe.

## 2017-10-11 NOTE — Progress Notes (Addendum)
PT Cancellation Note  Patient Details Name: [name] MRN: [id number] DOB: 04-12-1966   Cancelled Treatment:    Reason Eval/Treat Not Completed: PT screened, no needs identified, will sign off Pt seen and evaluated on 10/06/17.  Recommended pt continue with his HEP from [name].  Per recent progress notes, pt up and ambulating in hallways and more confused recently.  Recommend HHPT for home safety evaluation.  PT to sign off.   Presli Fanguy,KATHrine E 10/11/2017, 8:28 AM Carmelia Bake, PT, DPT 10/11/2017 Pager: [phone]

## 2017-10-12 DIAGNOSIS — F04 Amnestic disorder due to known physiological condition: Secondary | ICD-10-CM

## 2017-10-12 DIAGNOSIS — T424X2A Poisoning by benzodiazepines, intentional self-harm, initial encounter: Secondary | ICD-10-CM

## 2017-10-12 DIAGNOSIS — E512 Wernicke's encephalopathy: Secondary | ICD-10-CM | POA: Insufficient documentation

## 2017-10-12 HISTORY — DX: Poisoning by benzodiazepines, intentional self-harm, initial encounter: T42.4X2A

## 2017-10-12 HISTORY — DX: Amnestic disorder due to known physiological condition: F04

## 2017-10-12 HISTORY — DX: Wernicke's encephalopathy: E51.2

## 2017-10-12 MED ORDER — VITAMIN B-1 100 MG PO TABS
100.0000 mg | ORAL_TABLET | Freq: Every day | ORAL | Status: DC
  Administered 2017-10-12 – 2017-10-21 (×10): 100 mg via ORAL
  Filled 2017-10-12 (×12): qty 1

## 2017-10-12 MED ORDER — TRAZODONE HCL 100 MG PO TABS
100.0000 mg | ORAL_TABLET | Freq: Every evening | ORAL | Status: DC | PRN
  Administered 2017-10-12 – 2017-10-20 (×15): 100 mg via ORAL
  Filled 2017-10-12 (×26): qty 1

## 2017-10-12 NOTE — Progress Notes (Addendum)
[name] MD Progress Note  10/12/2017 3:57 PM [name]  MRN:  [id number] Subjective:   [name] is a 52 y/o M with history of MDD, alcohol use disorder, HIV, hepatic cirrhosis, and recently elevated ammonia with hepatic encephalopathy who was admitted after intentional overdose of xanax and alcohol. Pt was found to have elevated ammonia, and it remains elevated during this admission despite trending down with use of lactulose. He was transferred to [facility] for additional treatment and stabilization of his psychiatric symptoms. He was continued on home medication of amitriptyline and monitored on the inpatient unit. Pt has been reporting improvement of his mood symptoms; however, he has episodic confusion and disorientation. Yesterday evening he disrobed and was confused, but he was able to be redirected. Due to his behaviors he was transferred to the 500 hall. He was changed from amitriptyline to lexapro due reduced sedation and he was discontinued from ativan. Pt has been reporting improvement of his mood symptoms, but he remains confused and disoriented on the unit.  RN staff report that patient disrobed again last evening and was acting confused, but he was able to be redirected by staff. He remains on close observation due to his behaviors and poor insight.  Today upon evaluation, pt is pleasant and cooperative with the interview, but he remains confused, disoriented, and circumstantial/tangential with his responses. He appears to confabulate at times, specifically when asked orientation questions. He shares, "I'm good, maybe just a little stressed." he continues, "It's a depressive auto immune thing - I can't remember the name for it. I just take it out on the nearest thing." Pt is vague in his descriptions, but he suggests that he has been struggling with some cravings for alcohol, but those have been improving during his stay. He denies SI/HI/AH/VH. He is sleeping well. His appetite is good. He  denies physical complaints. He has no recollection of his behaviors of disrobing. When asked orientation questions he reports that the date is "September 21st." However, he correctly identifies the season as Spring. He is unsure of the year initially and after thinking he forgets the question, but when asked again he correctly identifies year as 2019. He is unable to describe what kind of facility he is in but when prompted with multiple choices such as Art therapist, hospital, and school, he replies, "Behavioral." He is aware he is in [place], [place]. Pt reports his medications have been helpful and he is in agreement to continue his current regimen. 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  MOCA was completed with patient and overall score was 6/30 with subcategory score breakdown as follows: Visuospatial/executive (1/5), naming (1/3), attention (1/6), language (0/3), abstraction (0/2), delayed recall (0/5), and orientation (3/6). Pt reported date as September 21st and day of week as Saturday, despite being corrected earlier in interview. He was able to correctly identify the year, his location in the hospital, and city as [name].   Principal Problem: Korsakoff syndrome Diagnosis:   Patient Active Problem List   Diagnosis Date Noted  . 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 Alcohol use disorder, severe, dependence ([place]) [F10.20] 10/11/2017  . MDD (major depressive disorder), recurrent severe, without psychosis ([place]) [F33.2]   . Alcoholism ([place]) [F10.20] 10/03/2017  . Obesity, Class III, BMI 40-49.9 (morbid obesity) ([place]) [E66.01] 10/03/2017  . Hepatic encephalopathy ([place]) [K72.90] 10/02/2017  . Suicidal ideation [R45.851]   . Secondary  esophageal varices with bleeding ([place]) [I85.11]   . Venous stasis [I87.8] 03/02/2016  . Secondary esophageal varices without bleeding ([facility]) [I85.10]    . Erectile dysfunction [N52.9] 07/14/2015  . Encephalopathy, hepatic ([place]) [K72.90] 01/23/2015  . Peripheral edema [R60.9] 12/31/2014  . Constipation [K59.00] 07/15/2014  . Fall [W19.XXXA] 01/07/2014  . Orthostatic hypotension [I95.1] 05/28/2013  . Personal history of failed moderate sedation- MUST HAVE MAC OR GENERAL [Z92.83] 05/01/2013  . Portal hypertensive gastropathy ([place]) [K76.6, K31.89] 01/02/2013  . Acquired pancytopenia ([place]) [M09.470] 05/19/2011  . Insomnia [G47.00] 12/21/2010  . Alcoholic cirrhosis of liver ([place]) [K70.30] 07/15/2010  . Idiopathic peripheral autonomic neuropathy [G90.09] 05/25/2007  . Idiopathic peripheral autonomic neuropathy, unspecified [G90.09] 05/25/2007  . SINUSITIS, CHRONIC MAXILLARY [J32.0] 03/06/2007  . Human immunodeficiency virus (HIV) disease ([place]) [B20] 06/23/2006  . HYPERLIPIDEMIA [E78.5] 06/23/2006  . Anxiety state [F41.1] 06/23/2006  . Depression [F32.9] 06/23/2006  . HYPERTENSION [I10] 06/23/2006  . Reflux esophagitis [K21.0] 06/23/2006   Total Time spent with patient: 30 minutes  Past Psychiatric History: see H&P  Past Medical History:  Past Medical History:  Diagnosis Date  . Alcoholic cirrhosis of liver ([place])   . Alcoholism, chronic ([place])   . ANXIETY 06/23/2006  . Ascites 11/13/2009  . Ascites   . Avulsion fracture of middle phalanges of  3rd/4th fingers left hand 01/07/2014  . Blood dyscrasia    HIV  . Cellulitis of right lower extremity 03/02/2016  . DEPRESSION 06/23/2006  . 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 Erectile dysfunction 07/14/2015  . 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 Gastric ulcer 06/2010  . GERD (gastroesophageal reflux disease)   . HIV DISEASE 06/23/2006  . HYPERLIPIDEMIA 06/23/2006  . HYPERTENSION 06/23/2006   denies  . 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 Iron deficiency anemia   . Left foot infection   . Obesity (BMI 30-39.9)    BMI 34 kg/m^2  . Portal hypertensive  gastropathy ([place]) 01/02/2013  . Recent shoulder injury    left shoulder/fell in parking lot/ no surgery/December 26, 2016  . SBP (spontaneous bacterial peritonitis) ([place]) 05/03/2011   Suspected by high leukocytes on paracentesis. Clinical scenario also compatible. November 2012 responded to Levaquin. Started on trimethoprim-sulfamethoxazole double strength daily for prophylaxis.   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 Skin cancer    left calf  . STRAIN, CHEST WALL 03/15/2007  . Varices, esophageal ([place]) 06/2010  . Venous stasis 03/02/2016    Past Surgical History:  Procedure Laterality Date  . CARPAL TUNNEL RELEASE     left  . COLONOSCOPY  march 2013  . ESOPHAGEAL BANDING  05/01/2013   Procedure: ESOPHAGEAL BANDING;  Surgeon: Gatha Mayer, MD;  Location: [facility] ENDOSCOPY;  Service: Endoscopy;;  . ESOPHAGOGASTRODUODENOSCOPY  07/01/2010;  08/12/10   small varices, gastric ulcer  . ESOPHAGOGASTRODUODENOSCOPY  10/26/2011   Procedure: ESOPHAGOGASTRODUODENOSCOPY (EGD);  Surgeon: Gatha Mayer, MD;  Location: [facility];  Service: Endoscopy;  Laterality: N/A;  . ESOPHAGOGASTRODUODENOSCOPY N/A 01/02/2013   Procedure: ESOPHAGOGASTRODUODENOSCOPY (EGD);  Surgeon: Gatha Mayer, MD;  Location: [facility];  Service: Endoscopy;  Laterality: N/A;  . ESOPHAGOGASTRODUODENOSCOPY N/A 04/23/2013   Procedure: ESOPHAGOGASTRODUODENOSCOPY (EGD);  Surgeon: Gatha Mayer, MD;  Location: [facility];  Service: Endoscopy;  Laterality: N/A;  . ESOPHAGOGASTRODUODENOSCOPY N/A 05/01/2013   Procedure: ESOPHAGOGASTRODUODENOSCOPY (EGD);  Surgeon: Gatha Mayer, MD;  Location: [facility];  Service: Endoscopy;  Laterality: N/A;  follow-up varices and possibly band them  . ESOPHAGOGASTRODUODENOSCOPY N/A 09/04/2013   Procedure: ESOPHAGOGASTRODUODENOSCOPY (  EGD);  Surgeon: Gatha Mayer, MD;  Location: [facility];  Service: Endoscopy;  Laterality: N/A;  . ESOPHAGOGASTRODUODENOSCOPY N/A 09/10/2014   Procedure:  ESOPHAGOGASTRODUODENOSCOPY (EGD);  Surgeon: Gatha Mayer, MD;  Location: [facility];  Service: Endoscopy;  Laterality: N/A;  . ESOPHAGOGASTRODUODENOSCOPY (EGD) WITH PROPOFOL N/A 08/21/2015   Procedure: ESOPHAGOGASTRODUODENOSCOPY (EGD) WITH PROPOFOL;  Surgeon: Gatha Mayer, MD;  Location: [facility];  Service: Endoscopy;  Laterality: N/A;  . ESOPHAGOGASTRODUODENOSCOPY (EGD) WITH PROPOFOL N/A 04/06/2017   Procedure: ESOPHAGOGASTRODUODENOSCOPY (EGD) WITH PROPOFOL;  Surgeon: Gatha Mayer, MD;  Location: [facility];  Service: Endoscopy;  Laterality: N/A;  . ESOPHAGOGASTRODUODENOSCOPY W/ BANDING  06/26/2010   variceal ligation  . GASTRIC VARICES BANDING N/A 01/02/2013   Procedure: GASTRIC VARICES BANDING;  Surgeon: Gatha Mayer, MD;  Location: [facility];  Service: Endoscopy;  Laterality: N/A;  possible banding  . UPPER GASTROINTESTINAL ENDOSCOPY     Family History:  Family History  Problem Relation Age of Onset  . Hyperlipidemia Mother   . Hypertension Mother   . Breast cancer Mother        questionable  . Heart disease Mother   . Hypertension Father   . Irritable bowel syndrome Father   . Drug abuse Brother   . Heart disease Brother   . Breast cancer Maternal Aunt        maternal great aunt  . Alcohol abuse Other   . Heart disease Maternal Uncle   . Irritable bowel syndrome Paternal Aunt   . Colon cancer Neg Hx    Family Psychiatric  History: see H&P Social History:  Social History   Substance and Sexual Activity  Alcohol Use Yes   Comment: pt is an alcoholic currently in [place] meetings     Social History   Substance and Sexual Activity  Drug Use No    Social History   Socioeconomic History  . Marital status: Single    Spouse name: Not on file  . Number of children: 0  . Years of education: Not on file  . Highest education level: Not on file  Occupational History  . Occupation: disability  Social Needs  . Financial resource strain: Not on file  .  Food insecurity:    Worry: Not on file    Inability: Not on file  . Transportation needs:    Medical: Not on file    Non-medical: Not on file  Tobacco Use  . Smoking status: Former Smoker    Packs/day: 0.10    Years: 10.00    Pack years: 1.00    Types: Cigarettes    Start date: 03/03/2014    Last attempt to quit: 04/03/2014    Years since quitting: 3.5  . Smokeless tobacco: Never Used  . Tobacco comment: Chews nicorette gum.  Substance and Sexual Activity  . Alcohol use: Yes    Comment: pt is an alcoholic currently in [employer]  . Drug use: No  . Sexual activity: Yes    Partners: Male    Birth control/protection: Condom    Comment: pt. declined condoms  Lifestyle  . Physical activity:    Days per week: Not on file    Minutes per session: Not on file  . Stress: Not on file  Relationships  . Social connections:    Talks on phone: Not on file    Gets together: Not on file    Attends religious service: Not on file    Active member of club or organization:  Not on file    Attends meetings of clubs or organizations: Not on file    Relationship status: Not on file  Other Topics Concern  . Not on file  Social History Narrative   Single, disabled hair stylist   Lives with parents in a home with a basement.  Parents do not like for him to go down stairs because they are steep.           Additional Social History:                         Sleep: Fair  Appetite:  Good  Current Medications: Current Facility-Administered Medications  Medication Dose Route Frequency Provider Last Rate Last Dose  . aMILoride (MIDAMOR) tablet 15 mg  15 mg Oral Daily Rankin, Shuvon B, NP   15 mg at 10/12/17 1019  . benzocaine (ORAJEL) 10 % mucosal gel   Mouth/Throat BID PRN Lindon Romp A, NP      . diclofenac sodium (VOLTAREN) 1 % transdermal gel 2 g  2 g Topical QID Sharma Covert, MD   2 g at 10/12/17 1021  . dolutegravir (TIVICAY) tablet 50 mg  50 mg Oral Daily  Rankin, Shuvon B, NP   50 mg at 10/12/17 0744  . emtricitabine-tenofovir AF (DESCOVY) 200-25 MG per tablet 1 tablet  1 tablet Oral Daily Rankin, Shuvon B, NP   1 tablet at 10/12/17 0744  . escitalopram (LEXAPRO) tablet 5 mg  5 mg Oral Daily Maris Berger T, MD   5 mg at 10/12/17 1019  . furosemide (LASIX) tablet 40 mg  40 mg Oral Daily Rankin, Shuvon B, NP   40 mg at 10/12/17 0747  . hydrOXYzine (ATARAX/VISTARIL) tablet 50 mg  50 mg Oral Q6H PRN Pennelope Bracken, MD   50 mg at 10/11/17 1710  . ibuprofen (ADVIL,MOTRIN) tablet 400 mg  400 mg Oral Q6H PRN Sharma Covert, MD      . lactulose ([place]) 10 GM/15ML solution 20 g  20 g Oral TID Sharma Covert, MD   20 g at 10/12/17 1321  . nicotine polacrilex (NICORETTE) gum 2 mg  2 mg Oral PRN Sharma Covert, MD   2 mg at 10/12/17 1319  . pantoprazole (PROTONIX) EC tablet 40 mg  40 mg Oral Daily Rankin, Shuvon B, NP   40 mg at 10/12/17 0744  . potassium chloride SA (K-DUR,KLOR-CON) CR tablet 40 mEq  40 mEq Oral BID Rankin, Shuvon B, NP   40 mEq at 10/12/17 0744  . QUEtiapine (SEROQUEL) tablet 25 mg  25 mg Oral Q8H PRN Sharma Covert, MD      . QUEtiapine (SEROQUEL) tablet 50 mg  50 mg Oral QHS Sharma Covert, MD   50 mg at 10/11/17 2125  . rifaximin (XIFAXAN) tablet 550 mg  550 mg Oral BID Rankin, Shuvon B, NP   550 mg at 10/12/17 0744  . simethicone (MYLICON) chewable tablet 80 mg  80 mg Oral TID PRN Rankin, Shuvon B, NP      . thiamine (VITAMIN B-1) tablet 100 mg  100 mg Oral Daily Kelin Borum T, MD      . traZODone (DESYREL) tablet 50 mg  50 mg Oral QHS PRN Pennelope Bracken, MD   50 mg at 10/11/17 2125    Lab Results: No results found for this or any previous visit (from the past 75 hour(s)).  Blood Alcohol level:  Lab Results  Component Value Date   ETH <10 10/02/2017   ETH <5 40/03/2724    Metabolic Disorder Labs: No results found for: HGBA1C, MPG No results found for:  PROLACTIN Lab Results  Component Value Date   CHOL 152 08/22/2017   TRIG 68 08/22/2017   HDL 56 08/22/2017   CHOLHDL 2.7 08/22/2017   VLDL 23 08/10/2016   LDLCALC 82 08/22/2017   LDLCALC 57 08/10/2016    Physical Findings: AIMS: Facial and Oral Movements Muscles of Facial Expression: None, normal Lips and Perioral Area: None, normal Jaw: None, normal Tongue: None, normal,Extremity Movements Upper (arms, wrists, hands, fingers): None, normal Lower (legs, knees, ankles, toes): None, normal, Trunk Movements Neck, shoulders, hips: None, normal, Overall Severity Severity of abnormal movements (highest score from questions above): None, normal Incapacitation due to abnormal movements: None, normal Patient's awareness of abnormal movements (rate only patient's report): No Awareness, Dental Status Current problems with teeth and/or dentures?: No Does patient usually wear dentures?: No  CIWA:    COWS:     Musculoskeletal: Strength & Muscle Tone: within normal limits Gait & Station: normal Patient leans: N/A  Psychiatric Specialty Exam: Physical Exam  Nursing note and vitals reviewed.   Review of Systems  Constitutional: Negative for chills and fever.  Respiratory: Negative for cough and shortness of breath.   Cardiovascular: Negative for chest pain.  Gastrointestinal: Negative for abdominal pain, heartburn, nausea and vomiting.  Psychiatric/Behavioral: Positive for memory loss. Negative for depression, hallucinations and suicidal ideas. The patient is not nervous/anxious and does not have insomnia.     Blood pressure (!) 154/77, pulse 87, temperature 97.6 F (36.4 C), temperature source Oral, resp. rate 16, height _0  (1.803 m), weight (!) 147.9 kg (326 lb).Body mass index is 45.47 kg/m.  General Appearance: Casual and Fairly Groomed  Eye Contact:  Good  Speech:  Clear and Coherent and Normal Rate  Volume:  Normal  Mood:  Euthymic  Affect:  Congruent and Constricted   Thought Process:  Disorganized and Goal Directed  Orientation:  Other:  oriented only to year and city  Thought Content:  confabulation  Suicidal Thoughts:  No  Homicidal Thoughts:  No  Memory:  Immediate;   Poor Recent;   Poor Remote;   Poor  Judgement:  Impaired  Insight:  Lacking  Psychomotor Activity:  Normal  Concentration:  Concentration: Poor  Recall:  Poor  Fund of Knowledge:  Fair  Language:  Poor  Akathisia:  No  Handed:    AIMS (if indicated):     Assets:  Resilience  ADL's:  Intact  Cognition:  WNL  Sleep:  Number of Hours: 5.75   Treatment Plan Summary: Daily contact with patient to assess and evaluate symptoms and progress in treatment and Medication management   -Continue inpatient hospitalization  -MDD, recurrent, severe, without psychosis            - Continue lexapro 75m po qDay             -Continue seroquel 557mpo qhs  -Wernicke-Korsakoff Syndrome   - Start thiamine 10015mo qDay   -SW investigating placement in memory care unit vs home health options due to severe cognitive impairment  -Anxiety           -Continue vistaril 32m52m q6h prn anxiety  -Agitation              -Continue seroquel 25mg70mq8h prn agitation  -Insomnia             -  Continue trazodone 77m po qhs prn insomnia  -HIV              -Continue tivicay 540mpo qDay             -Continue descovy 200-2541mer tablet, take 1 tablet daily  -Elevated ammonia              -Continue lactulose 20g TID   -Recheck ammonia on AM of 10/13/17  -Hepatic encephalopathy             -Continue rifaximin 550m46m BID  -HTN              -Continue amiloride 15mg40mqDay             - Continue lasix 40mg 65mDay  -hypokalemia             -Continue  Potassium chloride 40 mEq po BID   -Recheck CMP on AM of 10/13/17  -GERD              -Continue protonix 40mg p34may  -Acute pain              -Continue voltaren gel 1%, 2g apply topically to affected area QID  -Encourage  participation in groups and therapeutic milieu  -Disposition planning will be ongoing  ChristoPennelope Bracken1/2019, 3:57 PM

## 2017-10-12 NOTE — Progress Notes (Signed)
Recreation Therapy Notes  Date: 5.1.19 Time: 1000 Location: 500 Hall Dayroom  Group Topic: Anxiety  Goal Area(s) Addresses:  Patient will identify triggers for anxiety.  Patient will verbalize symptoms of anxiety. Patient will identify coping skill for anxiety post d/c.  Behavioral Response: Minimal  Intervention: Worksheet, pencils  Activity: Anxiety.  Patients were given a worksheet to identify triggers, symptoms, thoughts and coping skills related to anxiety.  Education: Communication, Discharge Planning  Education Outcome: Acknowledges understanding/In group clarification offered/Needs additional education.   Clinical Observations/Feedback: Pt arrived late.  Pt stated his triggers for anxiety were waiting, throwing in the towel, fast heartbeat and hopelessness.  Pt stated his coping skills were "whatever is legal and his dog".    [name], LRT/CTRS     [name] A 10/12/2017 11:55 AM

## 2017-10-12 NOTE — Progress Notes (Signed)
Patient remains on close obs due to increase confusion, wandering and disrobing.  Patient requires much redirection but did not attempt to disrobe this shift.  Patient was often noted to ask staff what he should be doing next and needing staff guidance.  Patient remains free of injury and close obs staff is in close proximity to patient.  Continue to monitor as planned.

## 2017-10-12 NOTE — Progress Notes (Signed)
Patient ID: [name], male   DOB: 12-Aug-1965, 52 y.o.   MRN: [id number] DAR Note: Pt continue to be very confused. Observed walking out of room naked a couple of time. Pt continue to be a very high fall risk. Pt at the time of assessment admitted to be confuse and can't tell what is real and what is not. Pt presents with a sad/flat affect. Pt endorsed moderated depression and anxiety. Pt denies pain, SI/HI or AVH. Pt was med compliant. All patient's questions and concerns addressed. Support, encouragement, and safe environment provided. 15-minute safety checks continue.

## 2017-10-12 NOTE — Progress Notes (Signed)
Patient ID: [name], male   DOB: 11-07-1965, 52 y.o.   MRN: [id number] Close Monitoring Note  Pt at this time is in bed resting with eyes closed. Pt does not look to be in any distress at this time. Close monitoring staff is present in room with Pt at this time. Close monitoring continues for Pt's safety. 15-minute safety checks also continues at this time.

## 2017-10-12 NOTE — BHH Group Notes (Signed)
LCSW Group Therapy Note  10/12/2017 1:15pm  Type of Therapy/Topic:  Group Therapy:  Balance in Life  Participation Level:  Minimal  Description of Group:    This group will address the concept of balance and how it feels and looks when one is unbalanced. Patients will be encouraged to process areas in their lives that are out of balance and identify reasons for remaining unbalanced. Facilitators will guide patients in utilizing problem-solving interventions to address and correct the stressor making their life unbalanced. Understanding and applying boundaries will be explored and addressed for obtaining and maintaining a balanced life. Patients will be encouraged to explore ways to assertively make their unbalanced needs known to significant others in their lives, using other group members and facilitator for support and feedback.  Therapeutic Goals: 1. Patient will identify two or more emotions or situations they have that consume much of in their lives. 2. Patient will identify signs/triggers that life has become out of balance:  3. Patient will identify two ways to set boundaries in order to achieve balance in their lives:  4. Patient will demonstrate ability to communicate their needs through discussion and/or role plays  Summary of Patient Progress:  Stayed the entire time, appeared engaged.  Unable to contribute anything related to the topic.  "The pain in my joints and my arms is holding me back."    Therapeutic Modalities:   Cognitive Behavioral Therapy Solution-Focused Therapy Assertiveness Training  Trish Mage, LCSW 10/12/2017 3:53 PM

## 2017-10-12 NOTE — Plan of Care (Signed)
D: Pt denies SI/HI/AVH. Pt is pleasant and cooperative. Pt continues to present confused, pt continues on close observation for safety  A: Pt was offered support and encouragement. Pt was given scheduled medications. Pt was encourage to attend groups. Q 15 minute checks were done for safety.   R:Pt attends groups and interacts well with peers and staff. Pt is taking medication. Pt has no complaints.Pt receptive to treatment and safety maintained on unit.  Problem: Activity: Goal: Interest or engagement in activities will improve Outcome: Progressing   Problem: Activity: Goal: Sleeping patterns will improve Outcome: Progressing   Problem: Education: Goal: Emotional status will improve Outcome: Progressing   Problem: Safety: Goal: Periods of time without injury will increase Outcome: Progressing

## 2017-10-13 DIAGNOSIS — R748 Abnormal levels of other serum enzymes: Secondary | ICD-10-CM

## 2017-10-13 LAB — COMPREHENSIVE METABOLIC PANEL
ALK PHOS: 95 U/L (ref 38–126)
ALT: 20 U/L (ref 17–63)
ANION GAP: 10 (ref 5–15)
AST: 32 U/L (ref 15–41)
Albumin: 3.7 g/dL (ref 3.5–5.0)
BILIRUBIN TOTAL: 1.5 mg/dL — AB (ref 0.3–1.2)
BUN: 12 mg/dL (ref 6–20)
CO2: 21 mmol/L — AB (ref 22–32)
CREATININE: 0.96 mg/dL (ref 0.61–1.24)
Calcium: 9 mg/dL (ref 8.9–10.3)
Chloride: 112 mmol/L — ABNORMAL HIGH (ref 101–111)
GFR calc Af Amer: >60 mL/min (ref >60)
Glucose, Bld: 88 mg/dL (ref 65–99)
Potassium: 4.1 mmol/L (ref 3.5–5.1)
SODIUM: 143 mmol/L (ref 135–145)
TOTAL PROTEIN: 6.6 g/dL (ref 6.5–8.1)

## 2017-10-13 LAB — AMMONIA: AMMONIA: 44 umol/L — AB (ref 9–35)

## 2017-10-13 NOTE — Progress Notes (Signed)
Patient remains on close obs due to increase confusion, wandering and disrobing.  Patient requires much redirection but did not attempt to disrobe this shift.  Patient was often noted to ask staff what he should be doing next and needing staff guidance.  Patient remains free of injury and close obs staff is in close proximity to patient.  Continue to monitor as planned.

## 2017-10-13 NOTE — BHH Counselor (Signed)
Submitted PASRR on patient today.

## 2017-10-13 NOTE — Progress Notes (Signed)
Pt $10.00 was found in the dryer with his clothes. Pt money was given to pt @5 :45 .

## 2017-10-13 NOTE — Progress Notes (Signed)
Recreation Therapy Notes  Date: 5.2.19 Time: 0950 Location: 500 Hall  Group Topic: Support Systems  Goal Area(s) Addresses:  Patient will be able to identify the benefits of healthy support systems. Patient will be able to identify the positive effects of support systems. Patient will be able to identify how support systems can be used post d/c.  Behavioral Response: Engaged  Intervention: Hydrologist  Activity: Sharks in [employer].  Each patient was given a rubber disc and the group was one disc more than the number of people in the group.  Patients were to use the discs to maneuver the group from the nurses station to the end of hall and back.  Education: Communication, Discharge Planning  Education Outcome: Acknowledges understanding/In group clarification offered/Needs additional education.   Clinical Observations/Feedback: Pt was bright and engaged during activity.  Pt was a little unsteady on his feet but was able to complete the activity.     [name], LRT/CTRS      Ria Comment, [name] A 10/13/2017 11:26 AM

## 2017-10-13 NOTE — [facility] Group Notes (Signed)
[facility] Group Therapy  10/13/2017 , 1:39 PM    Type of Therapy:  Mental Health Association Presentation  Participation Level:  Active  Participation Quality:  Attentive  Affect:  Blunted  Cognitive:  Oriented  Insight:  Limited  Engagement in Therapy:  Engaged  Modes of Intervention:  Discussion, Education and Socialization  Summary of Progress/Problems:  [name]  from [facility] came to present her recovery story, encourage group  members to share something about their story, and present information about the MHA.   Stayed the entire time, engaged throughout. When sharing at the end, he stated "It only works if you work it." [place] 10/13/2017 , 1:39 PM

## 2017-10-13 NOTE — Progress Notes (Signed)
[facility] MD Progress Note  10/13/2017 2:46 PM YUE GLASHEEN  MRN:  [id number]  Subjective: Kohle reports, "It is going well. I'm feeling upbeat today. I'm doing good on the regimen, I'm going by the schedule. Sleep was not so good last night. I slept for about 6:30 hours. I felt nauseated earlier, I took some medicine, it helped a little, nothing to write home about, a lot of communicable cable stuff".  Marquel Pottenger is a 52 y/o M with history of MDD, alcohol use disorder, HIV, hepatic cirrhosis, and recently elevated ammonia with hepatic encephalopathy who was admitted after intentional overdose of xanax and alcohol. Pt was found to have elevated ammonia, and it remains elevated during this admission despite trending down with use of lactulose. He was transferred to [facility] for additional treatment and stabilization of his psychiatric symptoms. He was continued on home medication of amitriptyline and monitored on the inpatient unit. Pt has been reporting improvement of his mood symptoms; however, he has episodic confusion and disorientation. Yesterday evening he disrobed and was confused, but he was able to be redirected. Due to his behaviors he was transferred to the 500 hall. He was changed from amitriptyline to lexapro due reduced sedation and he was discontinued from ativan. Pt has been reporting improvement of his mood symptoms, but he remains confused and disoriented on the unit.  Staff reports that patient remains very confused, needing a lot of re-direction.  Today upon evaluation, nothing has changed from previous reports from staff assessement & observation. During this follow-up care assessment, pt presents pleasant and cooperative with the interview, but he remains confused, disoriented and circumstantial/tangential with his responses. He appears to confabulate at times, specifically when asked orientation questions. Pt is vague in his descriptions of things, information & circumstances. He denies  SI/HI/AH/VH. He is sleeping well. His appetite is good. He denies physical complaints. He has no recollection of his behaviors of inappropriate behaviors, such as disrobing. Pt reports his medications have been helpful and he is in agreement to continue his current regimen. 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  Per the attending psychiatrist yesterday: MOCA was completed with patient and overall score was 6/30 with subcategory score breakdown as follows: Visuospatial/executive (1/5), naming (1/3), attention (1/6), language (0/3), abstraction (0/2), delayed recall (0/5), and orientation (3/6). Pt reported date as September 21st and day of week as Saturday, despite being corrected earlier in interview. He was able to correctly identify the year, his location in the hospital, and city as [place].  Principal Problem: Korsakoff syndrome Diagnosis:   Patient Active Problem List   Diagnosis Date Noted  . 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 Alcohol use disorder, severe, dependence ([place]) [F10.20] 10/11/2017  . MDD (major depressive disorder), recurrent severe, without psychosis ([place]) [F33.2]   . Alcoholism ([place]) [F10.20] 10/03/2017  . Obesity, Class III, BMI 40-49.9 (morbid obesity) ([place]) [E66.01] 10/03/2017  . Hepatic encephalopathy ([place]) [K72.90] 10/02/2017  . Suicidal ideation [R45.851]   . Secondary esophageal varices with bleeding (HCC) [I85.11]   . Venous stasis [I87.8] 03/02/2016  . Secondary esophageal varices without bleeding (HCC) [I85.10]   . Erectile dysfunction [N52.9] 07/14/2015  . Encephalopathy, hepatic ([place]) [K72.90] 01/23/2015  . Peripheral edema [R60.9] 12/31/2014  . Constipation [K59.00] 07/15/2014  . Fall [W19.XXXA] 01/07/2014  . Orthostatic hypotension [I95.1] 05/28/2013  . Personal history of failed moderate  sedation- MUST HAVE MAC OR GENERAL [Z92.83] 05/01/2013  . Portal hypertensive gastropathy ([place]) [K76.6, K31.89] 01/02/2013  . Acquired pancytopenia ([place]) [K27.062] 05/19/2011  . Insomnia [G47.00] 12/21/2010  . Alcoholic cirrhosis of liver ([place]) [K70.30] 07/15/2010  . Idiopathic peripheral autonomic neuropathy [G90.09] 05/25/2007  . Idiopathic peripheral autonomic neuropathy, unspecified [G90.09] 05/25/2007  . SINUSITIS, CHRONIC MAXILLARY [J32.0] 03/06/2007  . Human immunodeficiency virus (HIV) disease ([place]) [B20] 06/23/2006  . HYPERLIPIDEMIA [E78.5] 06/23/2006  . Anxiety state [F41.1] 06/23/2006  . Depression [F32.9] 06/23/2006  . HYPERTENSION [I10] 06/23/2006  . Reflux esophagitis [K21.0] 06/23/2006   Total Time spent with patient: 15 minutes  Past Psychiatric History: see H&P  Past Medical History:  Past Medical History:  Diagnosis Date  . Alcoholic cirrhosis of liver ([place])   . Alcoholism, chronic ([place])   . ANXIETY 06/23/2006  . Ascites 11/13/2009  . Ascites   . Avulsion fracture of middle phalanges of  3rd/4th fingers left hand 01/07/2014  . Blood dyscrasia    HIV  . Cellulitis of right lower extremity 03/02/2016  . DEPRESSION 06/23/2006  . 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 Erectile dysfunction 07/14/2015  . 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 Gastric ulcer 06/2010  . GERD (gastroesophageal reflux disease)   . HIV DISEASE 06/23/2006  . HYPERLIPIDEMIA 06/23/2006  . HYPERTENSION 06/23/2006   denies  . 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 Iron deficiency anemia   . Left foot infection   . Obesity (BMI 30-39.9)    BMI 34 kg/m^2  . Portal hypertensive gastropathy ([place]) 01/02/2013  . Recent shoulder injury    left shoulder/fell in parking lot/ no surgery/December 26, 2016  . SBP (spontaneous bacterial peritonitis) ([place]) 05/03/2011   Suspected by high leukocytes on paracentesis. Clinical scenario also compatible. November 2012 responded to Levaquin.  Started on trimethoprim-sulfamethoxazole double strength daily for prophylaxis.   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 Skin cancer    left calf  . STRAIN, CHEST WALL 03/15/2007  . Varices, esophageal ([place]) 06/2010  . Venous stasis 03/02/2016    Past Surgical History:  Procedure Laterality Date  . CARPAL TUNNEL RELEASE     left  . COLONOSCOPY  march 2013  . ESOPHAGEAL BANDING  05/01/2013   Procedure: ESOPHAGEAL BANDING;  Surgeon: Gatha Mayer, MD;  Location: [facility] END[facility]COPY;  Service: Endoscopy;;  . ESOPHAGOGASTRODUODENOSCOPY  07/01/2010;  08/12/10   small varices, gastric ulcer  . ESOPHAGOGASTRODUODENOSCOPY  10/26/2011   Procedure: ESOPHAGOGASTRODUODENOSCOPY (EGD);  Surgeon: Gatha Mayer, MD;  Location: [facility];  Service: Endoscopy;  Laterality: N/A;  . ESOPHAGOGASTRODUODENOSCOPY N/A 01/02/2013   Procedure: ESOPHAGOGASTRODUODENOSCOPY (EGD);  Surgeon: Gatha Mayer, MD;  Location: [facility];  Service: Endoscopy;  Laterality: N/A;  . ESOPHAGOGASTRODUODENOSCOPY N/A 04/23/2013   Procedure: ESOPHAGOGASTRODUODENOSCOPY (EGD);  Surgeon: Gatha Mayer, MD;  Location: [facility];  Service: Endoscopy;  Laterality: N/A;  . ESOPHAGOGASTRODUODENOSCOPY N/A 05/01/2013   Procedure: ESOPHAGOGASTRODUODENOSCOPY (EGD);  Surgeon: Gatha Mayer, MD;  Location: [facility];  Service: Endoscopy;  Laterality: N/A;  follow-up varices and possibly band them  . ESOPHAGOGASTRODUODENOSCOPY N/A 09/04/2013   Procedure: ESOPHAGOGASTRODUODENOSCOPY (EGD);  Surgeon: Gatha Mayer, MD;  Location: [facility];  Service: Endoscopy;  Laterality: N/A;  . ESOPHAGOGASTRODUODENOSCOPY N/A 09/10/2014   Procedure: ESOPHAGOGASTRODUODENOSCOPY (EGD);  Surgeon: Gatha Mayer, MD;  Location: [facility];  Service: Endoscopy;  Laterality: N/A;  . ESOPHAGOGASTRODUODENOSCOPY (EGD) WITH PROPOFOL N/A 08/21/2015   Procedure: ESOPHAGOGASTRODUODENOSCOPY (EGD) WITH PROPOFOL;  Surgeon: Gatha Mayer, MD;  Location:  [facility];   Service: Endoscopy;  Laterality: N/A;  . ESOPHAGOGASTRODUODENOSCOPY (EGD) WITH PROPOFOL N/A 04/06/2017   Procedure: ESOPHAGOGASTRODUODENOSCOPY (EGD) WITH PROPOFOL;  Surgeon: Gatha Mayer, MD;  [facility]: [facility];  Service: Endoscopy;  Laterality: N/A;  . ESOPHAGOGASTRODUODENOSCOPY W/ BANDING  06/26/2010   variceal ligation  . GASTRIC VARICES BANDING N/A 01/02/2013   Procedure: GASTRIC VARICES BANDING;  Surgeon: Gatha Mayer, MD;  Location: [facility];  Service: Endoscopy;  Laterality: N/A;  possible banding  . UPPER GASTROINTESTINAL ENDOSCOPY     Family History:  Family History  Problem Relation Age of Onset  . Hyperlipidemia Mother   . Hypertension Mother   . Breast cancer Mother        questionable  . Heart disease Mother   . Hypertension Father   . Irritable bowel syndrome Father   . Drug abuse Brother   . Heart disease Brother   . Breast cancer Maternal Aunt        maternal great aunt  . Alcohol abuse Other   . Heart disease Maternal Uncle   . Irritable bowel syndrome Paternal Aunt   . Colon cancer Neg Hx    Family Psychiatric  History: see H&P Social History:  Social History   Substance and Sexual Activity  Alcohol Use Yes   Comment: pt is an alcoholic currently in [place] meetings     Social History   Substance and Sexual Activity  Drug Use No    Social History   Socioeconomic History  . Marital status: Single    Spouse name: Not on file  . Number of children: 0  . Years of education: Not on file  . Highest education level: Not on file  Occupational History  . Occupation: disability  Social Needs  . Financial resource strain: Not on file  . Food insecurity:    Worry: Not on file    Inability: Not on file  . Transportation needs:    Medical: Not on file    Non-medical: Not on file  Tobacco Use  . Smoking status: Former Smoker    Packs/day: 0.10    Years: 10.00    Pack years: 1.00    Types: Cigarettes    Start date: 03/03/2014    Last  attempt to quit: 04/03/2014    Years since quitting: 3.5  . Smokeless tobacco: Never Used  . Tobacco comment: Chews nicorette gum.  Substance and Sexual Activity  . Alcohol use: Yes    Comment: pt is an alcoholic currently in [employer]  . Drug use: No  . Sexual activity: Yes    Partners: Male    Birth control/protection: Condom    Comment: pt. declined condoms  Lifestyle  . Physical activity:    Days per week: Not on file    Minutes per session: Not on file  . Stress: Not on file  Relationships  . Social connections:    Talks on phone: Not on file    Gets together: Not on file    Attends religious service: Not on file    Active member of club or organization: Not on file    Attends meetings of clubs or organizations: Not on file    Relationship status: Not on file  Other Topics Concern  . Not on file  Social History Narrative   Single, disabled hair stylist   Lives with parents in a home with a basement.  Parents do not like for him to go down stairs because they  are steep.           Additional Social History:   Sleep: Fair  Appetite:  Good  Current Medications: Current Facility-Administered Medications  Medication Dose Route Frequency Provider Last Rate Last Dose  . aMILoride (MIDAMOR) tablet 15 mg  15 mg Oral Daily Rankin, Shuvon B, NP   15 mg at 10/13/17 0804  . benzocaine (ORAJEL) 10 % mucosal gel   Mouth/Throat BID PRN Lindon Romp A, NP      . diclofenac sodium (VOLTAREN) 1 % transdermal gel 2 g  2 g Topical QID Sharma Covert, MD   2 g at 10/13/17 1116  . dolutegravir (TIVICAY) tablet 50 mg  50 mg Oral Daily Rankin, Shuvon B, NP   50 mg at 10/13/17 0805  . emtricitabine-tenofovir AF (DESCOVY) 200-25 MG per tablet 1 tablet  1 tablet Oral Daily Rankin, Shuvon B, NP   1 tablet at 10/13/17 0806  . escitalopram (LEXAPRO) tablet 5 mg  5 mg Oral Daily Pennelope Bracken, MD   5 mg at 10/13/17 0806  . furosemide (LASIX) tablet 40 mg  40 mg Oral  Daily Rankin, Shuvon B, NP   40 mg at 10/13/17 0806  . hydrOXYzine (ATARAX/VISTARIL) tablet 50 mg  50 mg Oral Q6H PRN Pennelope Bracken, MD   50 mg at 10/12/17 2054  . ibuprofen (ADVIL,MOTRIN) tablet 400 mg  400 mg Oral Q6H PRN Sharma Covert, MD      . lactulose ([place]) 10 GM/15ML solution 20 g  20 g Oral TID Sharma Covert, MD   20 g at 10/13/17 1116  . nicotine polacrilex (NICORETTE) gum 2 mg  2 mg Oral PRN Sharma Covert, MD   2 mg at 10/12/17 1319  . pantoprazole (PROTONIX) EC tablet 40 mg  40 mg Oral Daily Rankin, Shuvon B, NP   40 mg at 10/13/17 0806  . potassium chloride SA (K-DUR,KLOR-CON) CR tablet 40 mEq  40 mEq Oral BID Rankin, Shuvon B, NP   40 mEq at 10/13/17 0806  . QUEtiapine (SEROQUEL) tablet 25 mg  25 mg Oral Q8H PRN Sharma Covert, MD      . QUEtiapine (SEROQUEL) tablet 50 mg  50 mg Oral QHS Sharma Covert, MD   50 mg at 10/12/17 2054  . rifaximin (XIFAXAN) tablet 550 mg  550 mg Oral BID Rankin, Shuvon B, NP   550 mg at 10/13/17 0806  . simethicone (MYLICON) chewable tablet 80 mg  80 mg Oral TID PRN Rankin, Shuvon B, NP      . thiamine (VITAMIN B-1) tablet 100 mg  100 mg Oral Daily Pennelope Bracken, MD   100 mg at 10/13/17 0807  . traZODone (DESYREL) tablet 100 mg  100 mg Oral QHS,MR X 1 Laverle Hobby, PA-C   100 mg at 10/12/17 2249   Lab Results:  Results for orders placed or performed during the hospital encounter of 10/05/17 (from the past 48 hour(s))  Comprehensive metabolic panel     Status: Abnormal   Collection Time: 10/13/17  7:17 AM  Result Value Ref Range   Sodium 143 135 - 145 mmol/L   Potassium 4.1 3.5 - 5.1 mmol/L   Chloride 112 (H) 101 - 111 mmol/L   CO2 21 (L) 22 - 32 mmol/L   Glucose, Bld 88 65 - 99 mg/dL   BUN 12 6 - 20 mg/dL   Creatinine, Ser 0.96 0.61 - 1.24 mg/dL   Calcium 9.0 8.9 -  10.3 mg/dL   Total Protein 6.6 6.5 - 8.1 g/dL   Albumin 3.7 3.5 - 5.0 g/dL   AST 32 15 - 41 U/L   ALT 20 17 - 63 U/L    Alkaline Phosphatase 95 38 - 126 U/L   Total Bilirubin 1.5 (H) 0.3 - 1.2 mg/dL   GFR calc non Af Amer >60 >60 mL/min   GFR calc Af Amer >60 >60 mL/min    Comment: (NOTE) The eGFR has been calculated using the CKD EPI equation. This calculation has not been validated in all clinical situations. eGFR's persistently <60 mL/min signify possible Chronic Kidney Disease.    Anion gap 10 5 - 15    Comment: Performed at [facility], [place] [address] [place], [place] [address]  Ammonia     Status: Abnormal   Collection Time: 10/13/17  7:17 AM  Result Value Ref Range   Ammonia 44 (H) 9 - 35 umol/L    Comment: Performed at [facility], [place] [address] [place], [place] [address]   Blood Alcohol level:  Lab Results  Component Value Date   ETH <10 10/02/2017   ETH <5 40/03/2724   Metabolic Disorder Labs: No results found for: HGBA1C, MPG No results found for: PROLACTIN Lab Results  Component Value Date   CHOL 152 08/22/2017   TRIG 68 08/22/2017   HDL 56 08/22/2017   CHOLHDL 2.7 08/22/2017   VLDL 23 08/10/2016   LDLCALC 82 08/22/2017   LDLCALC 57 08/10/2016   Physical Findings: AIMS: Facial and Oral Movements Muscles of Facial Expression: None, normal Lips and Perioral Area: None, normal Jaw: None, normal Tongue: None, normal,Extremity Movements Upper (arms, wrists, hands, fingers): None, normal Lower (legs, knees, ankles, toes): None, normal, Trunk Movements Neck, shoulders, hips: None, normal, Overall Severity Severity of abnormal movements (highest score from questions above): None, normal Incapacitation due to abnormal movements: None, normal Patient's awareness of abnormal movements (rate only patient's report): No Awareness, Dental Status Current problems with teeth and/or dentures?: No Does patient usually wear dentures?: No  CIWA:    COWS:     Musculoskeletal: Strength & Muscle Tone: within normal limits Gait & Station:  normal Patient leans: N/A  Psychiatric Specialty Exam: Physical Exam  Nursing note and vitals reviewed.   Review of Systems  Constitutional: Negative for chills and fever.  Respiratory: Negative for cough and shortness of breath.   Cardiovascular: Negative for chest pain.  Gastrointestinal: Negative for abdominal pain, heartburn, nausea and vomiting.  Psychiatric/Behavioral: Positive for memory loss. Negative for depression, hallucinations and suicidal ideas. The patient is not nervous/anxious and does not have insomnia.     Blood pressure (!) 154/77, pulse 87, temperature 97.6 F (36.4 C), temperature source Oral, resp. rate 16, height _0  (1.803 m), weight (!) 147.9 kg (326 lb).Body mass index is 45.47 kg/m.  General Appearance: Casual and Fairly Groomed  Eye Contact:  Good  Speech:  Clear and Coherent and Normal Rate  Volume:  Normal  Mood:  Euthymic  Affect:  Congruent and Constricted  Thought Process:  Disorganized and Goal Directed  Orientation:  Other:  oriented only to year and city  Thought Content:  confabulation  Suicidal Thoughts:  No  Homicidal Thoughts:  No  Memory:  Immediate;   Poor Recent;   Poor Remote;   Poor  Judgement:  Impaired  Insight:  Lacking  Psychomotor Activity:  Normal  Concentration:  Concentration: Poor  Recall:  Poor  Fund of  Knowledge:  Fair  Language:  Poor  Akathisia:  No  Handed:    AIMS (if indicated):     Assets:  Resilience  ADL's:  Intact  Cognition:  WNL  Sleep:  Number of Hours: 3   Treatment Plan Summary: Daily contact with patient to assess and evaluate symptoms and progress in treatment and Medication management   -Continue inpatient hospitalization.  -Will continue today 10/13/2017 plan as below except where it is noted.  -MDD, recurrent, severe, without psychosis            - Continue lexapro 61m po qDay             -Continue seroquel 534mpo qhs  -Wernicke-Korsakoff Syndrome   - Continue thiamine 10037mo  qDay   -SW investigating placement in memory care unit vs home health options due to severe cognitive impairment  -Anxiety           -Continue vistaril 32m36m q6h prn anxiety  -Agitation              -Continue seroquel 25mg61mq8h prn agitation  -Insomnia             -Continue trazodone 32mg 16mhs prn insomnia  -HIV              -Continue tivicay 32mg p54may             -Continue descovy 200-25mg pe62mblet, take 1 tablet daily  -Elevated ammonia              -Continue lactulose 20g TID   -Recheck ammonia on AM of 10/13/17  -Hepatic encephalopathy             -Continue rifaximin 532mg po 38m -HTN              -Continue amiloride 15mg po q79m            - Continue lasix 40mg po qD13m-hypokalemia             -Continue  Potassium chloride 40 mEq po BID   -Recheck CMP on AM of 10/13/17  -GERD              -Continue protonix 40mg po qDa36mAcute pain              -Continue voltaren gel 1%, 2g apply topically to affected area QID  -Encourage participation in groups and therapeutic milieu  -Disposition planning will be ongoing  Myrka Sylva,Lindell Spar FNP-BC 10/13/2017, 2:46 PMPatient ID: Nezar D HugVladimir Creeks: [birth year]MR3 [id number]

## 2017-10-13 NOTE — Progress Notes (Signed)
Nursing 1:1 note D:Pt observed lying in bed. RR even and unlabored. No distress noted. A: 1:1 observation continues for safety  R: pt remains safe  

## 2017-10-13 NOTE — Progress Notes (Signed)
Nursing 1:1 note D:Pt observed sitting in bed RR even and unlabored. No distress noted.  A: 1:1 observation continues for safety  R: pt remains safe  

## 2017-10-13 NOTE — Progress Notes (Signed)
Ddi not attend group.

## 2017-10-14 NOTE — BHH Counselor (Signed)
Spoke with Cleon Dew at [facility] who stated they are not equipped to handle patient for placement.  He suggested I call Levin Erp at [facility] as they have a memory care unit.  Spoke with Ms Olena Heckle, who denied patient based on recent suicide attempt.  Left message for Rosario Adie at [facility] [phone]

## 2017-10-14 NOTE — Progress Notes (Signed)
Patient remains on close obs due to increase confusion, wandering and disrobing. Patient requires much redirection but did not attempt to disrobe this shift. Patient was often noted to ask staff what he should be doing next and needing staff guidance. Patient remains free of injury and close obs staff is in close proximity to patient. Continue to monitor as planned.

## 2017-10-14 NOTE — Progress Notes (Signed)
D: Pt denies SI/HI/AV hallucinations. Pt is pleasant and cooperative. Pt continues on close observation for confusion, wandering and disrobing. Patient with staff in close proximity. Pt. Remains free of falls and injury. Patient with family in to visit and interacted well.  A: Pt was offered support and encouragement. Pt was given scheduled medications. Pt was encourage to attend groups. Q 15 minute checks were done for safety.  R:Pt did not attend group and interacts well with staff. Pt is taking medication. Pt has no complaints.Pt receptive to treatment and safety maintained on unit.

## 2017-10-14 NOTE — Progress Notes (Signed)
Recreation Therapy Notes  Date: 5.3.19 Time: 1000 Location: 500 Hall Dayroom  Group Topic: Music Therapy  Goal Area(s) Addresses:  Patient will be able to identify the benefits of music. Patient will be able to identify emotions that come from music. Patient will identify how music can be used post d/c.   Behavioral Response: Engaged  Intervention: Music  Activity: Music Therapy.  Patients were to identify some of the feelings they have when listening to music.  Patients also shared with the group some of their favorite songs if appropriate.  Education: Communication, Discharge Planning  Education Outcome: Acknowledges understanding/In group clarification offered/Needs additional education.   Clinical Observations/Feedback:  Pt stated "I like a little of everything it just depends on my mood".  Pt appeared relaxed and sat with his eyes closed listening to the music.  Pt was appropriate during group.     [name], LRT/CTRS     [name] A 10/14/2017 12:13 PM

## 2017-10-14 NOTE — Tx Team (Signed)
Interdisciplinary Treatment and Diagnostic Plan Update  10/14/2017 Time of Session: [place] MRN: [id number]  Principal Diagnosis: Korsakoff syndrome  Secondary Diagnoses: Principal Problem:   Korsakoff syndrome Active Problems:   Encephalopathy, hepatic (HCC)   MDD (major depressive disorder), recurrent severe, without psychosis ([place])   Alcohol use disorder, severe, dependence ([place])   Current Medications:  Current Facility-Administered Medications  Medication Dose Route Frequency Provider Last Rate Last Dose  . aMILoride (MIDAMOR) tablet 15 mg  15 mg Oral Daily Rankin, Shuvon B, NP   15 mg at 10/14/17 0731  . benzocaine (ORAJEL) 10 % mucosal gel   Mouth/Throat BID PRN Lindon Romp A, NP      . diclofenac sodium (VOLTAREN) 1 % transdermal gel 2 g  2 g Topical QID Sharma Covert, MD   2 g at 10/14/17 0730  . dolutegravir (TIVICAY) tablet 50 mg  50 mg Oral Daily Rankin, Shuvon B, NP   50 mg at 10/14/17 0731  . emtricitabine-tenofovir AF (DESCOVY) 200-25 MG per tablet 1 tablet  1 tablet Oral Daily Rankin, Shuvon B, NP   1 tablet at 10/14/17 0731  . escitalopram (LEXAPRO) tablet 5 mg  5 mg Oral Daily Pennelope Bracken, MD   5 mg at 10/14/17 0731  . furosemide (LASIX) tablet 40 mg  40 mg Oral Daily Rankin, Shuvon B, NP   40 mg at 10/14/17 0731  . hydrOXYzine (ATARAX/VISTARIL) tablet 50 mg  50 mg Oral Q6H PRN Pennelope Bracken, MD   50 mg at 10/12/17 2054  . ibuprofen (ADVIL,MOTRIN) tablet 400 mg  400 mg Oral Q6H PRN Sharma Covert, MD      . lactulose ([place]) 10 GM/15ML solution 20 g  20 g Oral TID Sharma Covert, MD   20 g at 10/14/17 0730  . nicotine polacrilex (NICORETTE) gum 2 mg  2 mg Oral PRN Sharma Covert, MD   2 mg at 10/12/17 1319  . pantoprazole (PROTONIX) EC tablet 40 mg  40 mg Oral Daily Rankin, Shuvon B, NP   40 mg at 10/14/17 0731  . potassium chloride SA (K-DUR,KLOR-CON) CR tablet 40 mEq  40 mEq Oral BID Rankin, Shuvon B, NP   40  mEq at 10/14/17 0731  . QUEtiapine (SEROQUEL) tablet 25 mg  25 mg Oral Q8H PRN Sharma Covert, MD      . QUEtiapine (SEROQUEL) tablet 50 mg  50 mg Oral QHS Sharma Covert, MD   50 mg at 10/13/17 2129  . rifaximin (XIFAXAN) tablet 550 mg  550 mg Oral BID Rankin, Shuvon B, NP   550 mg at 10/14/17 0731  . simethicone (MYLICON) chewable tablet 80 mg  80 mg Oral TID PRN Rankin, Shuvon B, NP      . thiamine (VITAMIN B-1) tablet 100 mg  100 mg Oral Daily Pennelope Bracken, MD   100 mg at 10/14/17 0731  . traZODone (DESYREL) tablet 100 mg  100 mg Oral QHS,MR X 1 Laverle Hobby, PA-C   100 mg at 10/13/17 2129   PTA Medications: Medications Prior to Admission  Medication Sig Dispense Refill Last Dose  . AMBULATORY NON FORMULARY MEDICATION Lift Chair- Use as needed Dx: generalized weakness, hepatic encephalopathy 1 each 0 Past Month at Unknown time  . aMILoride (MIDAMOR) 5 MG tablet TAKE 3 TABLETS(15 MG) BY MOUTH DAILY 90 tablet 11 10/01/2017 at Unknown time  . CONSTULOSE 10 GM/15ML solution TAKE 60 ML BY MOUTH 4 TIMES DAILY 3792  mL 5 10/01/2017 at Unknown time  . dolutegravir (TIVICAY) 50 MG tablet Take 1 tablet (50 mg total) by mouth daily. 30 tablet 11 10/01/2017 at noon  . emtricitabine-tenofovir AF (DESCOVY) 200-25 MG tablet Take 1 tablet by mouth daily. 30 tablet 11 10/01/2017 at noon  . esomeprazole (NEXIUM) 40 MG capsule Take 1 capsule (40 mg total) by mouth daily before breakfast. 30 capsule 0 Past Month at Unknown time  . furosemide (LASIX) 40 MG tablet TAKE 2 TABLETS(80 MG) BY MOUTH TWICE DAILY 120 tablet 11 10/01/2017 at Unknown time  . potassium chloride SA (K-DUR,KLOR-CON) 20 MEQ tablet Take 2 tablets (40 mEq total) by mouth 2 (two) times daily. 120 tablet 11 10/01/2017 at Unknown time  . senna (SENOKOT) 8.6 MG TABS tablet TAKE 2 TABLETS(17.2 MG) BY MOUTH DAILY. START WITH 1 TABLET (Patient taking differently: 1 t po qd) 60 tablet 0 10/01/2017 at Unknown time  . Simethicone (GAS-X  PO) Take 1-2 tablets by mouth 3 (three) times daily as needed (for flatulence).    Past Month at Unknown time  . triamcinolone cream (KENALOG) 0.1 % APPLY TO AFFECTED AREA DAILY AS NEEDED FOR ITCHING OR RASH  1 Past Month at Unknown time  . XIFAXAN 550 MG TABS tablet TAKE ONE TABLET BY MOUTH TWICE DAILY 60 tablet 11 10/01/2017 at Unknown time    Patient Stressors: Health problems Loss of Mother died  Patient Strengths: Capable of independent living [employer]ator fund of knowledge  Treatment Modalities: Medication Management, Group therapy, Case management,  1 to 1 session with clinician, Psychoeducation, Recreational therapy.   Physician Treatment Plan for Primary Diagnosis: Korsakoff syndrome Long Term Goal(s): Improvement in symptoms so as ready for discharge Improvement in symptoms so as ready for discharge   Short Term Goals: Ability to identify changes in lifestyle to reduce recurrence of condition will improve Ability to verbalize feelings will improve Ability to disclose and discuss suicidal ideas Ability to demonstrate self-control will improve Ability to identify and develop effective coping behaviors will improve Ability to maintain clinical measurements within normal limits will improve Compliance with prescribed medications will improve Ability to identify triggers associated with substance abuse/mental health issues will improve Ability to identify changes in lifestyle to reduce recurrence of condition will improve Ability to verbalize feelings will improve Ability to disclose and discuss suicidal ideas Ability to demonstrate self-control will improve Ability to identify and develop effective coping behaviors will improve Ability to maintain clinical measurements within normal limits will improve Compliance with prescribed medications will improve Ability to identify triggers associated with substance abuse/mental health issues will improve  Medication  Management: Evaluate patient's response, side effects, and tolerance of medication regimen.  Therapeutic Interventions: 1 to 1 sessions, Unit Group sessions and Medication administration.  Evaluation of Outcomes: Progressing   5/3:  MOCA was completed with patient and overall score was 6/30 with subcategory score breakdown as follows: Visuospatial/executive (1/5), naming (1/3), attention (1/6), language (0/3), abstraction (0/2), delayed recall (0/5), and orientation (3/6). Pt reported date as September 21st and day of week as Saturday, despite being corrected earlier in interview. He was able to correctly identify the year, his location in the hospital, and city as [place]. Primary diagnosis is Korsakoff syndrome.   CSW to work on getting patient into memory care unit.    Physician Treatment Plan for Secondary Diagnosis: Principal Problem:   Korsakoff syndrome Active Problems:   Encephalopathy, hepatic (HCC)   MDD (major depressive disorder), recurrent severe, without psychosis ([place])  Alcohol use disorder, severe, dependence ([place])  Long Term Goal(s): Improvement in symptoms so as ready for discharge Improvement in symptoms so as ready for discharge   Short Term Goals: Ability to identify changes in lifestyle to reduce recurrence of condition will improve Ability to verbalize feelings will improve Ability to disclose and discuss suicidal ideas Ability to demonstrate self-control will improve Ability to identify and develop effective coping behaviors will improve Ability to maintain clinical measurements within normal limits will improve Compliance with prescribed medications will improve Ability to identify triggers associated with substance abuse/mental health issues will improve Ability to identify changes in lifestyle to reduce recurrence of condition will improve Ability to verbalize feelings will improve Ability to disclose and discuss suicidal ideas Ability to demonstrate  self-control will improve Ability to identify and develop effective coping behaviors will improve Ability to maintain clinical measurements within normal limits will improve Compliance with prescribed medications will improve Ability to identify triggers associated with substance abuse/mental health issues will improve     Medication Management: Evaluate patient's response, side effects, and tolerance of medication regimen.  Therapeutic Interventions: 1 to 1 sessions, Unit Group sessions and Medication administration.  Evaluation of Outcomes: Progressing   RN Treatment Plan for Primary Diagnosis: Korsakoff syndrome Long Term Goal(s): Knowledge of disease and therapeutic regimen to maintain health will improve  Short Term Goals: Ability to identify and develop effective coping behaviors will improve and Compliance with prescribed medications will improve  Medication Management: RN will administer medications as ordered by provider, will assess and evaluate patient's response and provide education to patient for prescribed medication. RN will report any adverse and/or side effects to prescribing provider.  Therapeutic Interventions: 1 on 1 counseling sessions, Psychoeducation, Medication administration, Evaluate responses to treatment, Monitor vital signs and CBGs as ordered, Perform/monitor CIWA, COWS, AIMS and Fall Risk screenings as ordered, Perform wound care treatments as ordered.  Evaluation of Outcomes: Progressing   LCSW Treatment Plan for Primary Diagnosis: Korsakoff syndrome Long Term Goal(s): Safe transition to appropriate next level of care at discharge, Engage patient in therapeutic group addressing interpersonal concerns.  Short Term Goals: Engage patient in aftercare planning with referrals and resources, Increase social support and Increase skills for wellness and recovery  Therapeutic Interventions: Assess for all discharge needs, 1 to 1 time with Social worker, Explore  available resources and support systems, Assess for adequacy in community support network, Educate family and significant other(s) on suicide prevention, Complete Psychosocial Assessment, Interpersonal group therapy.  Evaluation of Outcomes: Progressing   Progress in Treatment: Attending groups: Yes Participating in groups: Minimally Taking medication as prescribed: Yes. Toleration medication: Yes. Family/Significant other contact made: No, will contact:  aunt Patient understands diagnosis: No Discussing patient identified problems/goals with staff: Yes. Medical problems stabilized or resolved: Yes. Denies suicidal/homicidal ideation: Yes. Issues/concerns per patient self-inventory: No. Other: none  New problem(s) identified: No, Describe:  none  New Short Term/Long Term Goal(s):Pt goal: "to get clean, detox from xanax"  Discharge Plan or Barriers:   Reason for Continuation of Hospitalization: Depression Medication stabilization Withdrawal symptoms  Estimated Length of Stay: 2-4 days.  Attendees: Patient: 10/14/2017   Physician: Maris Berger, MD 10/14/2017   Nursing: Elesa Massed RN 10/14/2017   RN Care Manager: 10/14/2017   Social Worker: Roque Lias, [place] 10/14/2017   Recreational Therapist:  10/14/2017   Other:  10/14/2017   Other:  10/14/2017   Other: 10/14/2017     Scribe for Treatment Team: Trish Mage, LCSW 10/14/2017 10:44  AM

## 2017-10-14 NOTE — Progress Notes (Signed)
D: Patient continues on close observation and staff remains with patient at all times. Patient continues with confusion and wandering. He has not disrobed this morning. Patient remains free of falls and injury.  A: Pt was offered support and encouragement. Pt was given scheduled medications. Q 15 minute checks were done for safety.  R: Pt is taking medication. Pt has no complaints.Pt receptive to treatment and safety maintained on unit.

## 2017-10-14 NOTE — Progress Notes (Signed)
[facility] 1:1 Observation Documentation  For the first (8) hours following discontinuation of 1:1 precautions, a progress note entry by nursing staff should be documented at least every 2 hours, reflecting the patient's behavior, condition, mood, and conversation.  Use the progress notes for additional entries.  Time 1:1 discontinued:  1600 Patient's Behavior:  Patient is calm and cooperative and easy to redirect.    Patient's Condition:  Patient is free of harm to self and others.    Patient's Conversation:   Patient is currently having a family visit and engaged in appropriate conversation with family.   Clarita Crane 10/14/2017, 6:50 PM

## 2017-10-14 NOTE — Progress Notes (Signed)
[facility] MD Progress Note  10/14/2017 12:03 PM [name]  MRN:  [id number]  Subjective: [name] reports, "It is going good.  I slept well, was up for may be an hour or two. My mood is good. I'm attending group sessions. I did attend one & half groups yesterday".  [name] is a 52 y/o M with history of MDD, alcohol use disorder, HIV, hepatic cirrhosis, and recently elevated ammonia with hepatic encephalopathy who was admitted after intentional overdose of xanax and alcohol. Pt was found to have elevated ammonia, and it remains elevated during this admission despite trending down with use of lactulose. He was transferred to [facility] for additional treatment and stabilization of his psychiatric symptoms. He was continued on home medication of amitriptyline and monitored on the inpatient unit. Pt has been reporting improvement of his mood symptoms; however, he has episodic confusion and disorientation. Yesterday evening he disrobed and was confused, but he was able to be redirected. Due to his behaviors he was transferred to the 500 hall. He was changed from amitriptyline to lexapro due reduced sedation and he was discontinued from ativan. Pt has been reporting improvement of his mood symptoms, but he remains confused and disoriented on the unit.  Staff reports that patient remains very confused, needing a lot of re-direction.  Today upon evaluation, nothing has not changed from previous reports from staff assessement & observation. During this follow-up care assessment, pt presents pleasant and cooperative with the interview, but remains confused, disoriented and circumstantial/tangential with his responses. He appears to confabulate at times, specifically when asked orientation questions. Pt is vague in his descriptions of things, information & circumstances. He denies SI/HI/AH/VH. He is sleeping well. His appetite is good. He denies physical complaints. Pt reports his medications have been helpful and he is  in agreement to continue his current regimen. 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 Reviewed current lad reports. His current his current ammonium level is 44, still elevated, but threading down.  Per the attending psychiatrist 2 day ago : [facility] was completed with patient and overall score was 6/30 with subcategory score breakdown as follows: Visuospatial/executive (1/5), naming (1/3), attention (1/6), language (0/3), abstraction (0/2), delayed recall (0/5), and orientation (3/6). Pt reported date as September 21st and day of week as Saturday, despite being corrected earlier in interview. He was able to correctly identify the year, his location in the hospital, and city as [place].  Principal Problem: Korsakoff syndrome Diagnosis:   Patient Active Problem List   Diagnosis Date Noted  . 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 Alcohol use disorder, severe, dependence ([place]) [F10.20] 10/11/2017  . MDD (major depressive disorder), recurrent severe, without psychosis ([place]) [F33.2]   . Alcoholism ([place]) [F10.20] 10/03/2017  . Obesity, Class III, BMI 40-49.9 (morbid obesity) ([place]) [E66.01] 10/03/2017  . Hepatic encephalopathy ([place]) [K72.90] 10/02/2017  . Suicidal ideation [R45.851]   . Secondary esophageal varices with bleeding (HCC) [I85.11]   . Venous stasis [I87.8] 03/02/2016  . Secondary esophageal varices without bleeding (HCC) [I85.10]   . Erectile dysfunction [N52.9] 07/14/2015  . Encephalopathy, hepatic ([place]) [K72.90] 01/23/2015  . Peripheral edema [R60.9] 12/31/2014  . Constipation [K59.00] 07/15/2014  . Fall [W19.XXXA] 01/07/2014  . Orthostatic hypotension [I95.1] 05/28/2013  . Personal history of failed moderate sedation- MUST HAVE MAC OR GENERAL [Z92.83] 05/01/2013  . Portal hypertensive gastropathy (HCC) [K76.6,  K31.89] 01/02/2013  .  Acquired pancytopenia ([place]) [T61.443] 05/19/2011  . Insomnia [G47.00] 12/21/2010  . Alcoholic cirrhosis of liver ([place]) [K70.30] 07/15/2010  . Idiopathic peripheral autonomic neuropathy [G90.09] 05/25/2007  . Idiopathic peripheral autonomic neuropathy, unspecified [G90.09] 05/25/2007  . SINUSITIS, CHRONIC MAXILLARY [J32.0] 03/06/2007  . Human immunodeficiency virus (HIV) disease ([place]) [B20] 06/23/2006  . HYPERLIPIDEMIA [E78.5] 06/23/2006  . Anxiety state [F41.1] 06/23/2006  . Depression [F32.9] 06/23/2006  . HYPERTENSION [I10] 06/23/2006  . Reflux esophagitis [K21.0] 06/23/2006   Total Time spent with patient: 15 minutes  Past Psychiatric History: see H&P  Past Medical History:  Past Medical History:  Diagnosis Date  . Alcoholic cirrhosis of liver ([place])   . Alcoholism, chronic ([place])   . ANXIETY 06/23/2006  . Ascites 11/13/2009  . Ascites   . Avulsion fracture of middle phalanges of  3rd/4th fingers left hand 01/07/2014  . Blood dyscrasia    HIV  . Cellulitis of right lower extremity 03/02/2016  . DEPRESSION 06/23/2006  . 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 Erectile dysfunction 07/14/2015  . 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 Gastric ulcer 06/2010  . GERD (gastroesophageal reflux disease)   . HIV DISEASE 06/23/2006  . HYPERLIPIDEMIA 06/23/2006  . HYPERTENSION 06/23/2006   denies  . 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 Iron deficiency anemia   . Left foot infection   . Obesity (BMI 30-39.9)    BMI 34 kg/m^2  . Portal hypertensive gastropathy ([place]) 01/02/2013  . Recent shoulder injury    left shoulder/fell in parking lot/ no surgery/December 26, 2016  . SBP (spontaneous bacterial peritonitis) ([place]) 05/03/2011   Suspected by high leukocytes on paracentesis. Clinical scenario also compatible. November 2012 responded to Levaquin. Started on trimethoprim-sulfamethoxazole double strength daily for prophylaxis.   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  Skin cancer    left calf  . STRAIN, CHEST WALL 03/15/2007  . Varices, esophageal ([place]) 06/2010  . Venous stasis 03/02/2016    Past Surgical History:  Procedure Laterality Date  . CARPAL TUNNEL RELEASE     left  . COLONOSCOPY  march 2013  . ESOPHAGEAL BANDING  05/01/2013   Procedure: ESOPHAGEAL BANDING;  Surgeon: Gatha Mayer, MD;  Location: [facility] ENDOSCOPY;  Service: Endoscopy;;  . ESOPHAGOGASTRODUODENOSCOPY  07/01/2010;  08/12/10   small varices, gastric ulcer  . ESOPHAGOGASTRODUODENOSCOPY  10/26/2011   Procedure: ESOPHAGOGASTRODUODENOSCOPY (EGD);  Surgeon: Gatha Mayer, MD;  Location: [facility];  Service: Endoscopy;  Laterality: N/A;  . ESOPHAGOGASTRODUODENOSCOPY N/A 01/02/2013   Procedure: ESOPHAGOGASTRODUODENOSCOPY (EGD);  Surgeon: Gatha Mayer, MD;  Location: [facility];  Service: Endoscopy;  Laterality: N/A;  . ESOPHAGOGASTRODUODENOSCOPY N/A 04/23/2013   Procedure: ESOPHAGOGASTRODUODENOSCOPY (EGD);  Surgeon: Gatha Mayer, MD;  Location: [facility];  Service: Endoscopy;  Laterality: N/A;  . ESOPHAGOGASTRODUODENOSCOPY N/A 05/01/2013   Procedure: ESOPHAGOGASTRODUODENOSCOPY (EGD);  Surgeon: Gatha Mayer, MD;  Location: [facility];  Service: Endoscopy;  Laterality: N/A;  follow-up varices and possibly band them  . ESOPHAGOGASTRODUODENOSCOPY N/A 09/04/2013   Procedure: ESOPHAGOGASTRODUODENOSCOPY (EGD);  Surgeon: Gatha Mayer, MD;  Location: [facility];  Service: Endoscopy;  Laterality: N/A;  . ESOPHAGOGASTRODUODENOSCOPY N/A 09/10/2014   Procedure: ESOPHAGOGASTRODUODENOSCOPY (EGD);  Surgeon: Gatha Mayer, MD;  Location: [facility];  Service: Endoscopy;  Laterality: N/A;  . ESOPHAGOGASTRODUODENOSCOPY (EGD) WITH PROPOFOL N/A 08/21/2015   Procedure: ESOPHAGOGASTRODUODENOSCOPY (EGD) WITH PROPOFOL;  Surgeon: Gatha Mayer, MD;  Location: [facility];  Service: Endoscopy;  Laterality: N/A;  . ESOPHAGOGASTRODUODENOSCOPY (EGD) WITH PROPOFOL  N/A 04/06/2017   Procedure:  ESOPHAGOGASTRODUODENOSCOPY (EGD) WITH PROPOFOL;  Surgeon: Gatha Mayer, MD;  Location: [facility];  Service: Endoscopy;  Laterality: N/A;  . ESOPHAGOGASTRODUODENOSCOPY W/ BANDING  06/26/2010   variceal ligation  . GASTRIC VARICES BANDING N/A 01/02/2013   Procedure: GASTRIC VARICES BANDING;  Surgeon: Gatha Mayer, MD;  Location: [facility];  Service: Endoscopy;  Laterality: N/A;  possible banding  . UPPER GASTROINTESTINAL ENDOSCOPY     Family History:  Family History  Problem Relation Age of Onset  . Hyperlipidemia Mother   . Hypertension Mother   . Breast cancer Mother        questionable  . Heart disease Mother   . Hypertension Father   . Irritable bowel syndrome Father   . Drug abuse Brother   . Heart disease Brother   . Breast cancer Maternal Aunt        maternal great aunt  . Alcohol abuse Other   . Heart disease Maternal Uncle   . Irritable bowel syndrome Paternal Aunt   . Colon cancer Neg Hx    Family Psychiatric  History: see H&P Social History:  Social History   Substance and Sexual Activity  Alcohol Use Yes   Comment: pt is an alcoholic currently in [place] meetings     Social History   Substance and Sexual Activity  Drug Use No    Social History   Socioeconomic History  . Marital status: Single    Spouse name: Not on file  . Number of children: 0  . Years of education: Not on file  . Highest education level: Not on file  Occupational History  . Occupation: disability  Social Needs  . Financial resource strain: Not on file  . Food insecurity:    Worry: Not on file    Inability: Not on file  . Transportation needs:    Medical: Not on file    Non-medical: Not on file  Tobacco Use  . Smoking status: Former Smoker    Packs/day: 0.10    Years: 10.00    Pack years: 1.00    Types: Cigarettes    Start date: 03/03/2014    Last attempt to quit: 04/03/2014    Years since quitting: 3.5  . Smokeless tobacco: Never Used  . Tobacco comment:  Chews nicorette gum.  Substance and Sexual Activity  . Alcohol use: Yes    Comment: pt is an alcoholic currently in [employer]  . Drug use: No  . Sexual activity: Yes    Partners: Male    Birth control/protection: Condom    Comment: pt. declined condoms  Lifestyle  . Physical activity:    Days per week: Not on file    Minutes per session: Not on file  . Stress: Not on file  Relationships  . Social connections:    Talks on phone: Not on file    Gets together: Not on file    Attends religious service: Not on file    Active member of club or organization: Not on file    Attends meetings of clubs or organizations: Not on file    Relationship status: Not on file  Other Topics Concern  . Not on file  Social History Narrative   Single, disabled hair stylist   Lives with parents in a home with a basement.  Parents do not like for him to go down stairs because they are steep.           Additional Social  History:   Sleep: Fair  Appetite:  Good  Current Medications: Current Facility-Administered Medications  Medication Dose Route Frequency Provider Last Rate Last Dose  . aMILoride (MIDAMOR) tablet 15 mg  15 mg Oral Daily Rankin, Shuvon B, NP   15 mg at 10/14/17 0731  . benzocaine (ORAJEL) 10 % mucosal gel   Mouth/Throat BID PRN Lindon Romp A, NP      . diclofenac sodium (VOLTAREN) 1 % transdermal gel 2 g  2 g Topical QID Sharma Covert, MD   2 g at 10/14/17 0730  . dolutegravir (TIVICAY) tablet 50 mg  50 mg Oral Daily Rankin, Shuvon B, NP   50 mg at 10/14/17 0731  . emtricitabine-tenofovir AF (DESCOVY) 200-25 MG per tablet 1 tablet  1 tablet Oral Daily Rankin, Shuvon B, NP   1 tablet at 10/14/17 0731  . escitalopram (LEXAPRO) tablet 5 mg  5 mg Oral Daily Pennelope Bracken, MD   5 mg at 10/14/17 0731  . furosemide (LASIX) tablet 40 mg  40 mg Oral Daily Rankin, Shuvon B, NP   40 mg at 10/14/17 0731  . hydrOXYzine (ATARAX/VISTARIL) tablet 50 mg  50 mg Oral Q6H  PRN Pennelope Bracken, MD   50 mg at 10/12/17 2054  . ibuprofen (ADVIL,MOTRIN) tablet 400 mg  400 mg Oral Q6H PRN Sharma Covert, MD      . lactulose ([place]) 10 GM/15ML solution 20 g  20 g Oral TID Sharma Covert, MD   20 g at 10/14/17 0730  . nicotine polacrilex (NICORETTE) gum 2 mg  2 mg Oral PRN Sharma Covert, MD   2 mg at 10/12/17 1319  . pantoprazole (PROTONIX) EC tablet 40 mg  40 mg Oral Daily Rankin, Shuvon B, NP   40 mg at 10/14/17 0731  . potassium chloride SA (K-DUR,KLOR-CON) CR tablet 40 mEq  40 mEq Oral BID Rankin, Shuvon B, NP   40 mEq at 10/14/17 0731  . QUEtiapine (SEROQUEL) tablet 25 mg  25 mg Oral Q8H PRN Sharma Covert, MD      . QUEtiapine (SEROQUEL) tablet 50 mg  50 mg Oral QHS Sharma Covert, MD   50 mg at 10/13/17 2129  . rifaximin (XIFAXAN) tablet 550 mg  550 mg Oral BID Rankin, Shuvon B, NP   550 mg at 10/14/17 0731  . simethicone (MYLICON) chewable tablet 80 mg  80 mg Oral TID PRN Rankin, Shuvon B, NP      . thiamine (VITAMIN B-1) tablet 100 mg  100 mg Oral Daily Pennelope Bracken, MD   100 mg at 10/14/17 0731  . traZODone (DESYREL) tablet 100 mg  100 mg Oral QHS,MR X 1 Laverle Hobby, PA-C   100 mg at 10/13/17 2129   Lab Results:  Results for orders placed or performed during the hospital encounter of 10/05/17 (from the past 48 hour(s))  Comprehensive metabolic panel     Status: Abnormal   Collection Time: 10/13/17  7:17 AM  Result Value Ref Range   Sodium 143 135 - 145 mmol/L   Potassium 4.1 3.5 - 5.1 mmol/L   Chloride 112 (H) 101 - 111 mmol/L   CO2 21 (L) 22 - 32 mmol/L   Glucose, Bld 88 65 - 99 mg/dL   BUN 12 6 - 20 mg/dL   Creatinine, Ser 0.96 0.61 - 1.24 mg/dL   Calcium 9.0 8.9 - 10.3 mg/dL   Total Protein 6.6 6.5 - 8.1 g/dL   Albumin  3.7 3.5 - 5.0 g/dL   AST 32 15 - 41 U/L   ALT 20 17 - 63 U/L   Alkaline Phosphatase 95 38 - 126 U/L   Total Bilirubin 1.5 (H) 0.3 - 1.2 mg/dL   GFR calc non Af Amer >60 >60 mL/min    GFR calc Af Amer >60 >60 mL/min    Comment: (NOTE) The eGFR has been calculated using the CKD EPI equation. This calculation has not been validated in all clinical situations. eGFR's persistently <60 mL/min signify possible Chronic Kidney Disease.    Anion gap 10 5 - 15    Comment: Performed at [facility], [place] [address] [place], [place] [address]  Ammonia     Status: Abnormal   Collection Time: 10/13/17  7:17 AM  Result Value Ref Range   Ammonia 44 (H) 9 - 35 umol/L    Comment: Performed at [facility], [place] [address], [place], [place] [address]   Blood Alcohol level:  Lab Results  Component Value Date   ETH <10 10/02/2017   ETH <5 60/63/0160   Metabolic Disorder Labs: No results found for: HGBA1C, MPG No results found for: PROLACTIN Lab Results  Component Value Date   CHOL 152 08/22/2017   TRIG 68 08/22/2017   HDL 56 08/22/2017   CHOLHDL 2.7 08/22/2017   VLDL 23 08/10/2016   LDLCALC 82 08/22/2017   LDLCALC 57 08/10/2016   Physical Findings: AIMS: Facial and Oral Movements Muscles of Facial Expression: None, normal Lips and Perioral Area: None, normal Jaw: None, normal Tongue: None, normal,Extremity Movements Upper (arms, wrists, hands, fingers): None, normal Lower (legs, knees, ankles, toes): None, normal, Trunk Movements Neck, shoulders, hips: None, normal, Overall Severity Severity of abnormal movements (highest score from questions above): None, normal Incapacitation due to abnormal movements: None, normal Patient's awareness of abnormal movements (rate only patient's report): No Awareness, Dental Status Current problems with teeth and/or dentures?: No Does patient usually wear dentures?: No  CIWA:    COWS:     Musculoskeletal: Strength & Muscle Tone: within normal limits Gait & Station: normal Patient leans: N/A  Psychiatric Specialty Exam: Physical Exam  Nursing note and vitals reviewed.   Review of Systems   Constitutional: Negative for chills and fever.  Respiratory: Negative for cough and shortness of breath.   Cardiovascular: Negative for chest pain.  Gastrointestinal: Negative for abdominal pain, heartburn, nausea and vomiting.  Psychiatric/Behavioral: Positive for memory loss. Negative for depression, hallucinations and suicidal ideas. The patient is not nervous/anxious and does not have insomnia.     Blood pressure (!) 154/77, pulse 87, temperature 97.6 F (36.4 C), temperature source Oral, resp. rate 16, height _0  (1.803 m), weight (!) 147.9 kg (326 lb).Body mass index is 45.47 kg/m.  General Appearance: Casual and Fairly Groomed  Eye Contact:  Good  Speech:  Clear and Coherent and Normal Rate  Volume:  Normal  Mood:  Euthymic  Affect:  Congruent and Constricted  Thought Process:  Disorganized and Goal Directed  Orientation:  Other:  oriented only to year and city  Thought Content:  confabulation  Suicidal Thoughts:  No  Homicidal Thoughts:  No  Memory:  Immediate;   Poor Recent;   Poor Remote;   Poor  Judgement:  Impaired  Insight:  Lacking  Psychomotor Activity:  Normal  Concentration:  Concentration: Poor  Recall:  Poor  Fund of Knowledge:  Fair  Language:  Poor  Akathisia:  No  Handed:  AIMS (if indicated):     Assets:  Resilience  ADL's:  Intact  Cognition:  WNL  Sleep:  Number of Hours: 6.75   Treatment Plan Summary: Daily contact with patient to assess and evaluate symptoms and progress in treatment and Medication management   -Continue inpatient hospitalization.  -Will continue today 10/14/2017 plan as below except where it is noted.  -MDD, recurrent, severe, without psychosis            - Continue lexapro 20m po qDay             -Continue seroquel 560mpo qhs  -Wernicke-Korsakoff Syndrome   - Continue thiamine 10047mo qDay   -SW investigating placement in memory care unit vs home health options due to severe cognitive impairment  -Anxiety            -Continue vistaril 13m78m q6h prn anxiety  -Agitation              -Continue seroquel 25mg45mq8h prn agitation  -Insomnia             -Continue trazodone 13mg 24mhs prn insomnia  -HIV              -Continue tivicay 13mg p4may             -Continue descovy 200-25mg pe77mblet, take 1 tablet daily  -Elevated ammonia              -Continue lactulose 20g TID   -Reviewed result of the ammonia level done on AM of 10/13/17: 44, still high, but threading down.  -Hepatic encephalopathy             -Continue rifaximin 513mg po 64m -HTN              -Continue amiloride 15mg po q90m            - Continue lasix 40mg po qD68m-hypokalemia             -Continue  Potassium chloride 40 mEq po BID   -Recheck CMP on AM of 10/13/17  -GERD              -Continue protonix 40mg po qDa55mAcute pain              -Continue voltaren gel 1%, 2g apply topically to affected area QID  -Encourage participation in groups and therapeutic milieu  -Disposition planning will be ongoing  Margie Urbanowicz,Lindell Spar FNP-BC 10/14/2017, 12:03 PMPatient ID: Tyden D HugVladimir Creeks: [birth year], [id number] Juniper D HugMAOR MECKEL: [birth year], [id number]

## 2017-10-14 NOTE — Progress Notes (Signed)
D: Pt continues on close observation for confusion, wandering and disrobing. Patient with staff in close proximity. Pt. Remains free of falls and injury.   A: Pt with  Q 15 minute checks  for safety. Staff remains with patient. R: Pt has no complaints.Pt receptive to treatment and safety maintained on unit.

## 2017-10-14 NOTE — [facility] Group Notes (Signed)
[facility] Group Notes:  (Nursing/MHT/Case Management/Adjunct)  Date:  10/14/2017  Time:  11:53 AM  Type of Therapy:  Orientation and Goals Group  Participation Level:  Active  Participation Quality:  Appropriate and Supportive  Affect:  Appropriate  Cognitive:  Alert  Insight:  Appropriate and Good  Engagement in Group:  Engaged  Modes of Intervention:  Discussion and Orientation  Summary of Progress/Problems: His goal for today is to get the right resources so that he can get longer term rehab.   [place] 10/14/2017, 11:53 AM

## 2017-10-14 NOTE — Progress Notes (Signed)
[facility] 1:1 Observation Documentation  For the first (8) hours following discontinuation of 1:1 precautions, a progress note entry by nursing staff should be documented at least every 2 hours, reflecting the patient's behavior, condition, mood, and conversation.  Use the progress notes for additional entries.  Time 1:1 discontinued:  1600  Patient's Behavior:  Patient is calm cooperative and easy to redirect   Patient's Condition:  Patient is free of harm to self and other.      Patient's Conversation:  Patient     [name] 10/14/2017, 4:26 PM

## 2017-10-15 DIAGNOSIS — G8929 Other chronic pain: Secondary | ICD-10-CM

## 2017-10-15 DIAGNOSIS — R413 Other amnesia: Secondary | ICD-10-CM

## 2017-10-15 NOTE — Progress Notes (Addendum)
[facility] MD Progress Note  10/15/2017 4:29 PM NAREK KNISS  MRN:  [id number]   Subjective: Gerilyn Nestle awake alert and oriented to person, place and current event.  Patient presents pleasant slightly guarded.  Patient observed with eyes closed while talking during assessment.  Provided minimal eye contact.  Reports she is feeling a lot better today states he is hopeful to see his brother over the weekend.  However reports his brother lives in [place].  Rates his depression a 3/10 during this assessment.  Reports he is kept him up on last night because they were talking too loudly.  Reports taking medications as prescribed and tolerating them well denies medication side effects.  Discussed initiating additional lab collections with ammonia level.  NP monitoring for downward trend with electrolytes.  Patient was agreeable to plan.  Patient denies suicidal or homicidal ideations.  Chart reviewed noted patient to have memory concerns.  Patient is currently denying pain or discomfort.  Denies auditory or visual hallucinations during this assessment.  Support and encouragement and reassurance was provided.  History: Per admission assessment-Patient is seen and examined.  Patient is a 53 year old male with a past psychiatric history significant for alcohol dependence, probable depression and a past medical history significant for HIV positive as well as liver cirrhosis.  The patient stated he had been in his usual state of health until late 09/11/2017.  His mother died on October 03, 2022.  This was unexpected.  He was significantly depressed about this.  He drinks on a daily basis, but he stated he had began to drink more alcohol.  He is prescribed Xanax from 1 of his primary physicians.  He took an intentional overdose of the Xanax in conjunction with his alcohol intake.  He became frightened by the prospect of the lethal type of overdose, and contacted his family members.  They took him to the emergency room at [facility] on  4/21.  He was admitted at that time    Principal Problem: Korsakoff syndrome Diagnosis:   Patient Active Problem List   Diagnosis Date Noted  . 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 Alcohol use disorder, severe, dependence ([place]) [F10.20] 10/11/2017  . MDD (major depressive disorder), recurrent severe, without psychosis ([place]) [F33.2]   . Alcoholism ([place]) [F10.20] 10/03/2017  . Obesity, Class III, BMI 40-49.9 (morbid obesity) ([place]) [E66.01] 10/03/2017  . Hepatic encephalopathy ([place]) [K72.90] 10/02/2017  . Suicidal ideation [R45.851]   . Secondary esophageal varices with bleeding (HCC) [I85.11]   . Venous stasis [I87.8] 03/02/2016  . Secondary esophageal varices without bleeding (HCC) [I85.10]   . Erectile dysfunction [N52.9] 07/14/2015  . Encephalopathy, hepatic ([place]) [K72.90] 01/23/2015  . Peripheral edema [R60.9] 12/31/2014  . Constipation [K59.00] 07/15/2014  . Fall [W19.XXXA] 01/07/2014  . Orthostatic hypotension [I95.1] 05/28/2013  . Personal history of failed moderate sedation- MUST HAVE MAC OR GENERAL [Z92.83] 05/01/2013  . Portal hypertensive gastropathy ([place]) [K76.6, K31.89] 01/02/2013  . Acquired pancytopenia ([place]) [J50.093] 05/19/2011  . Insomnia [G47.00] 12/21/2010  . Alcoholic cirrhosis of liver ([place]) [K70.30] 07/15/2010  . Idiopathic peripheral autonomic neuropathy [G90.09] 05/25/2007  . Idiopathic peripheral autonomic neuropathy, unspecified [G90.09] 05/25/2007  . SINUSITIS, CHRONIC MAXILLARY [J32.0] 03/06/2007  . Human immunodeficiency virus (HIV) disease ([place]) [B20] 06/23/2006  . HYPERLIPIDEMIA [E78.5] 06/23/2006  . Anxiety state [F41.1] 06/23/2006  . Depression [F32.9] 06/23/2006  . HYPERTENSION [I10] 06/23/2006  . Reflux esophagitis [K21.0] 06/23/2006   Total Time spent with patient: 30 minutes  Past Psychiatric History:  Past Medical  History:  Past Medical History:  Diagnosis Date  . Alcoholic cirrhosis of liver ([place])   . Alcoholism, chronic ([place])    . ANXIETY 06/23/2006  . Ascites 11/13/2009  . Ascites   . Avulsion fracture of middle phalanges of  3rd/4th fingers left hand 01/07/2014  . Blood dyscrasia    HIV  . Cellulitis of right lower extremity 03/02/2016  . DEPRESSION 06/23/2006  . 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 Erectile dysfunction 07/14/2015  . 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 Gastric ulcer 06/2010  . GERD (gastroesophageal reflux disease)   . HIV DISEASE 06/23/2006  . HYPERLIPIDEMIA 06/23/2006  . HYPERTENSION 06/23/2006   denies  . 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 Iron deficiency anemia   . Left foot infection   . Obesity (BMI 30-39.9)    BMI 34 kg/m^2  . Portal hypertensive gastropathy ([place]) 01/02/2013  . Recent shoulder injury    left shoulder/fell in parking lot/ no surgery/December 26, 2016  . SBP (spontaneous bacterial peritonitis) ([place]) 05/03/2011   Suspected by high leukocytes on paracentesis. Clinical scenario also compatible. November 2012 responded to Levaquin. Started on trimethoprim-sulfamethoxazole double strength daily for prophylaxis.   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 Skin cancer    left calf  . STRAIN, CHEST WALL 03/15/2007  . Varices, esophageal ([place]) 06/2010  . Venous stasis 03/02/2016    Past Surgical History:  Procedure Laterality Date  . CARPAL TUNNEL RELEASE     left  . COLONOSCOPY  march 2013  . ESOPHAGEAL BANDING  05/01/2013   Procedure: ESOPHAGEAL BANDING;  Surgeon: Gatha Mayer, MD;  Location: [facility] ENDOSCOPY;  Service: Endoscopy;;  . ESOPHAGOGASTRODUODENOSCOPY  07/01/2010;  08/12/10   small varices, gastric ulcer  . ESOPHAGOGASTRODUODENOSCOPY  10/26/2011   Procedure: ESOPHAGOGASTRODUODENOSCOPY (EGD);  Surgeon: Gatha Mayer, MD;  Location: [facility];  Service: Endoscopy;  Laterality: N/A;  . ESOPHAGOGASTRODUODENOSCOPY N/A 01/02/2013   Procedure: ESOPHAGOGASTRODUODENOSCOPY (EGD);  Surgeon: Gatha Mayer, MD;  Location: [facility];  Service:  Endoscopy;  Laterality: N/A;  . ESOPHAGOGASTRODUODENOSCOPY N/A 04/23/2013   Procedure: ESOPHAGOGASTRODUODENOSCOPY (EGD);  Surgeon: Gatha Mayer, MD;  Location: [facility];  Service: Endoscopy;  Laterality: N/A;  . ESOPHAGOGASTRODUODENOSCOPY N/A 05/01/2013   Procedure: ESOPHAGOGASTRODUODENOSCOPY (EGD);  Surgeon: Gatha Mayer, MD;  Location: [facility];  Service: Endoscopy;  Laterality: N/A;  follow-up varices and possibly band them  . ESOPHAGOGASTRODUODENOSCOPY N/A 09/04/2013   Procedure: ESOPHAGOGASTRODUODENOSCOPY (EGD);  Surgeon: Gatha Mayer, MD;  Location: [facility];  Service: Endoscopy;  Laterality: N/A;  . ESOPHAGOGASTRODUODENOSCOPY N/A 09/10/2014   Procedure: ESOPHAGOGASTRODUODENOSCOPY (EGD);  Surgeon: Gatha Mayer, MD;  Location: [facility];  Service: Endoscopy;  Laterality: N/A;  . ESOPHAGOGASTRODUODENOSCOPY (EGD) WITH PROPOFOL N/A 08/21/2015   Procedure: ESOPHAGOGASTRODUODENOSCOPY (EGD) WITH PROPOFOL;  Surgeon: Gatha Mayer, MD;  Location: [facility];  Service: Endoscopy;  Laterality: N/A;  . ESOPHAGOGASTRODUODENOSCOPY (EGD) WITH PROPOFOL N/A 04/06/2017   Procedure: ESOPHAGOGASTRODUODENOSCOPY (EGD) WITH PROPOFOL;  Surgeon: Gatha Mayer, MD;  Location: [facility];  Service: Endoscopy;  Laterality: N/A;  . ESOPHAGOGASTRODUODENOSCOPY W/ BANDING  06/26/2010   variceal ligation  . GASTRIC VARICES BANDING N/A 01/02/2013   Procedure: GASTRIC VARICES BANDING;  Surgeon: Gatha Mayer, MD;  Location: [facility];  Service: Endoscopy;  Laterality: N/A;  possible banding  . UPPER GASTROINTESTINAL ENDOSCOPY     Family History:  Family History  Problem Relation Age of Onset  . Hyperlipidemia Mother   . Hypertension Mother   . Breast cancer  Mother        questionable  . Heart disease Mother   . Hypertension Father   . Irritable bowel syndrome Father   . Drug abuse Brother   . Heart disease Brother   . Breast cancer Maternal Aunt        maternal great aunt  . Alcohol  abuse Other   . Heart disease Maternal Uncle   . Irritable bowel syndrome Paternal Aunt   . Colon cancer Neg Hx    Family Psychiatric  History:  Social History:  Social History   Substance and Sexual Activity  Alcohol Use Yes   Comment: pt is an alcoholic currently in [place] meetings     Social History   Substance and Sexual Activity  Drug Use No    Social History   Socioeconomic History  . Marital status: Single    Spouse name: Not on file  . Number of children: 0  . Years of education: Not on file  . Highest education level: Not on file  Occupational History  . Occupation: disability  Social Needs  . Financial resource strain: Not on file  . Food insecurity:    Worry: Not on file    Inability: Not on file  . Transportation needs:    Medical: Not on file    Non-medical: Not on file  Tobacco Use  . Smoking status: Former Smoker    Packs/day: 0.10    Years: 10.00    Pack years: 1.00    Types: Cigarettes    Start date: 03/03/2014    Last attempt to quit: 04/03/2014    Years since quitting: 3.5  . Smokeless tobacco: Never Used  . Tobacco comment: Chews nicorette gum.  Substance and Sexual Activity  . Alcohol use: Yes    Comment: pt is an alcoholic currently in [employer]  . Drug use: No  . Sexual activity: Yes    Partners: Male    Birth control/protection: Condom    Comment: pt. declined condoms  Lifestyle  . Physical activity:    Days per week: Not on file    Minutes per session: Not on file  . Stress: Not on file  Relationships  . Social connections:    Talks on phone: Not on file    Gets together: Not on file    Attends religious service: Not on file    Active member of club or organization: Not on file    Attends meetings of clubs or organizations: Not on file    Relationship status: Not on file  Other Topics Concern  . Not on file  Social History Narrative   Single, disabled hair stylist   Lives with parents in a home with a  basement.  Parents do not like for him to go down stairs because they are steep.           Additional Social History:                         Sleep: Fair  Appetite:  Good  Current Medications: Current Facility-Administered Medications  Medication Dose Route Frequency Provider Last Rate Last Dose  . aMILoride (MIDAMOR) tablet 15 mg  15 mg Oral Daily Rankin, Shuvon B, NP   15 mg at 10/15/17 0929  . benzocaine (ORAJEL) 10 % mucosal gel   Mouth/Throat BID PRN Lindon Romp A, NP      . diclofenac sodium (VOLTAREN) 1 % transdermal gel 2 g  2 g Topical QID Sharma Covert, MD   2 g at 10/15/17 1335  . dolutegravir (TIVICAY) tablet 50 mg  50 mg Oral Daily Rankin, Shuvon B, NP   50 mg at 10/15/17 0930  . emtricitabine-tenofovir AF (DESCOVY) 200-25 MG per tablet 1 tablet  1 tablet Oral Daily Rankin, Shuvon B, NP   1 tablet at 10/15/17 0931  . escitalopram (LEXAPRO) tablet 5 mg  5 mg Oral Daily Pennelope Bracken, MD   5 mg at 10/15/17 0931  . furosemide (LASIX) tablet 40 mg  40 mg Oral Daily Rankin, Shuvon B, NP   40 mg at 10/15/17 0930  . hydrOXYzine (ATARAX/VISTARIL) tablet 50 mg  50 mg Oral Q6H PRN Pennelope Bracken, MD   50 mg at 10/14/17 2059  . ibuprofen (ADVIL,MOTRIN) tablet 400 mg  400 mg Oral Q6H PRN Sharma Covert, MD      . lactulose ([place]) 10 GM/15ML solution 20 g  20 g Oral TID Sharma Covert, MD   20 g at 10/15/17 1335  . nicotine polacrilex (NICORETTE) gum 2 mg  2 mg Oral PRN Sharma Covert, MD   2 mg at 10/12/17 1319  . pantoprazole (PROTONIX) EC tablet 40 mg  40 mg Oral Daily Rankin, Shuvon B, NP   40 mg at 10/15/17 0931  . potassium chloride SA (K-DUR,KLOR-CON) CR tablet 40 mEq  40 mEq Oral BID Rankin, Shuvon B, NP   40 mEq at 10/15/17 0930  . QUEtiapine (SEROQUEL) tablet 25 mg  25 mg Oral Q8H PRN Sharma Covert, MD      . QUEtiapine (SEROQUEL) tablet 50 mg  50 mg Oral QHS Sharma Covert, MD   50 mg at 10/14/17 2100  . rifaximin  (XIFAXAN) tablet 550 mg  550 mg Oral BID Rankin, Shuvon B, NP   550 mg at 10/15/17 0931  . simethicone (MYLICON) chewable tablet 80 mg  80 mg Oral TID PRN Rankin, Shuvon B, NP      . thiamine (VITAMIN B-1) tablet 100 mg  100 mg Oral Daily Pennelope Bracken, MD   100 mg at 10/15/17 0932  . traZODone (DESYREL) tablet 100 mg  100 mg Oral QHS,MR X 1 Laverle Hobby, PA-C   100 mg at 10/14/17 2232    Lab Results: No results found for this or any previous visit (from the past 48 hour(s)).  Blood Alcohol level:  Lab Results  Component Value Date   ETH <10 10/02/2017   ETH <5 27/08/5007    Metabolic Disorder Labs: No results found for: HGBA1C, MPG No results found for: PROLACTIN Lab Results  Component Value Date   CHOL 152 08/22/2017   TRIG 68 08/22/2017   HDL 56 08/22/2017   CHOLHDL 2.7 08/22/2017   VLDL 23 08/10/2016   LDLCALC 82 08/22/2017   LDLCALC 57 08/10/2016    Physical Findings: AIMS: Facial and Oral Movements Muscles of Facial Expression: None, normal Lips and Perioral Area: None, normal Jaw: None, normal Tongue: None, normal,Extremity Movements Upper (arms, wrists, hands, fingers): None, normal Lower (legs, knees, ankles, toes): None, normal, Trunk Movements Neck, shoulders, hips: None, normal, Overall Severity Severity of abnormal movements (highest score from questions above): None, normal Incapacitation due to abnormal movements: None, normal Patient's awareness of abnormal movements (rate only patient's report): No Awareness, Dental Status Current problems with teeth and/or dentures?: No Does patient usually wear dentures?: No  CIWA:    COWS:     Musculoskeletal: Strength &  Muscle Tone: within normal limits Gait & Station: normal Patient leans: N/A  Psychiatric Specialty Exam: Physical Exam  Review of Systems  Psychiatric/Behavioral: Positive for memory loss. Negative for depression and suicidal ideas. The patient has insomnia. The patient is not  nervous/anxious.     Blood pressure 124/63, pulse 76, temperature (!) 97.2 F (36.2 C), temperature source Oral, resp. rate 18, height _0  (1.803 m), weight (!) 147.9 kg (326 lb).Body mass index is 45.47 kg/m.  General Appearance: Casual  Eye Contact:  Minimal   Speech:  Clear and Coherent  Volume:  Normal  Mood:  Anxious and Depressed  Affect:  Congruent  Thought Process:  Coherent  Orientation:  Full (Time, Place, and Person)  Thought Content:  Logical and Hallucinations: None  Suicidal Thoughts:  No  Homicidal Thoughts:  No  Memory:  Immediate;   Fair Recent;   Fair Remote;   Fair  Judgement:  Fair  Insight:  Fair  Psychomotor Activity:  NA  Concentration:  Concentration: Fair  Recall:  [employer] of Knowledge:  Fair  Language:  Fair  Akathisia:  No  Handed:  Right  AIMS (if indicated):     Assets:  Communication Skills Desire for Improvement Social Support  ADL's:  Intact  Cognition:  WNL  Sleep:  Number of Hours: 6.75     Treatment Plan Summary: Daily contact with patient to assess and evaluate symptoms and progress in treatment and Medication management   Continue her current treatment plan listed below on 10/15/2017 except for noted  -MDD, recurrent, severe, without psychosis            - Continue lexapro 68m po qDay             -Continue seroquel 592mpo qhs  -Wernicke-Korsakoff Syndrome   - Continue thiamine 10066mo qDay   -SW investigating placement in memory care unit vs home health options due to severe cognitive impairment  -Anxiety           -Continue vistaril 40m70m q6h prn anxiety  -Agitation              -Continue seroquel 25mg79mq8h prn agitation  -Insomnia             -Continue trazodone 40mg 17mhs prn insomnia  -HIV              -Continue tivicay 40mg p61may             -Continue descovy 200-25mg pe23mblet, take 1 tablet daily  -Elevated ammonia              -Continue lactulose 20g TID   -Recheck ammonia on AM of  10/13/17- reviewed  downward trend. 44 uml/L will repeat on 10/16/2017  -Hepatic encephalopathy             -Continue rifaximin 540mg po 65m -HTN              -Continue amiloride 15mg po q24m            - Continue lasix 40mg po qD13m-hypokalemia             -Continue  Potassium chloride 40 mEq po BID   -Recheck CMP on AM of 10/13/17- labs reviewed K 4.1 on   -GERD              -Continue protonix 40mg po qDa42mAcute pain              -  Continue voltaren gel 1%, 2g apply topically to affected area QID  -Encourage participation in groups and therapeutic milieu -Disposition planning will be ongoing    Derrill Center, NP 10/15/2017, 4:29 PM   ..Agree with NP Progress Note

## 2017-10-15 NOTE — BHH Group Notes (Signed)
LCSW Therapy Group   10/15/2017 10:15AM               Type of Therapy and Topic:  Group Therapy: Anger Cues and Responses  Participation Level:  Active  In this group, patients learned how to recognize the physical, cognitive, emotional, and behavioral responses they have to anger-provoking situations.  They identified a recent time they became angry and how they reacted.  They analyzed how their reaction was possibly beneficial and how it was possibly unhelpful.  The group discussed anger warning signs and how to know when our anger can potentially become a problem.  Therapeutic Goals: 1. Patients will remember their last incident of anger and how they felt emotionally and physically, what their thoughts were at the time, and how they behaved. 2. Patients will identify how their behavior at that time worked for them, as well as how it worked against them. 3. Patients will explore how their body, mind and feelings play a role with anger. 4. Patients will learn that anger itself is normal and cannot be eliminated, and that healthier reactions can assist with resolving conflict rather than worsening situations. 5. Patients will complete a personal Anger Inventory worksheet to identify and scale their anger triggers, physical/emotional/behavioral reactions to anger and how their anger impacts them.  Summary of Patient Progress:   Pt attended group, and actively participated when prompted by CSW; however, pt's responses to questions were often not related to the group topic and pt was not able to respond appropriately. Pt was able to share that "standing in line at the grocery store," makes him angry because he "feels like it's cheaper to just eat out." Pt reported he likes to listen to music in order to cope with his anger. Pt will continue to work towards his tx goals throughout his admission.   Therapeutic Modalities:   Cognitive Behavioral Therapy  Alden Hipp, MSW, LCSW Clinical Social  Worker 10/15/2017 10:56 AM

## 2017-10-15 NOTE — Progress Notes (Signed)
Patient ID: [name], male   DOB: 03-09-1966, 52 y.o.   MRN: [id number]  DAR Note:  Pt continue to be very confused; "I just don't understand what is going on." Pt observed in the dayroom not interacting. Pt continue to endorse auditory hallucinations; "I don't think that those (the voices) will ever stop." Pt who presents with a sad/flat affect also endorsed moderated anxiety. Pt denied depression, pain, SI/HI or AVH. Pt was med compliant. All patient's questions and concerns addressed. Support, encouragement, and safe environment provided. 15-minute safety checks continue

## 2017-10-15 NOTE — Progress Notes (Signed)
[facility] 1:1 Observation Documentation  For the first (8) hours following discontinuation of 1:1 precautions, a progress note entry by nursing staff should be documented at least every 2 hours, reflecting the patient's behavior, condition, mood, and conversation.  Use the progress notes for additional entries.  Time 1:1 discontinued:  1600  Patient's Behavior: Patient is calm and cooperative   Patient's Condition:  Patient is free of harm to self and others  Patient's Conversation:  Alone in the dayroom  [name] 10/15/2017, 1:03 AMPatient ID: [name], male   DOB: 12/21/1965, 52 y.o.   MRN: [id number]

## 2017-10-15 NOTE — Progress Notes (Signed)
[facility] 1:1 Observation Documentation  For the first (8) hours following discontinuation of 1:1 precautions, a progress note entry by nursing staff should be documented at least every 2 hours, reflecting the patient's behavior, condition, mood, and conversation.  Use the progress notes for additional entries.  Time 1:1 discontinued:  1600  Patient's Behavior:  Calm and cooperative  Patient's Condition:  Patient is free of harm to self and others  Patient's Conversation:  Quiet In the dayroom  [place] 10/15/2017, 1:05 AMPatient ID: [name], male   DOB: 07-19-1965, 52 y.o.   MRN: [id number]

## 2017-10-16 LAB — AMMONIA: Ammonia: 83 umol/L — ABNORMAL HIGH (ref 9–35)

## 2017-10-16 MED ORDER — LIDOCAINE 5 % EX PTCH
1.0000 | MEDICATED_PATCH | CUTANEOUS | Status: DC
  Administered 2017-10-16 – 2017-10-20 (×5): 1 via TRANSDERMAL
  Filled 2017-10-16 (×8): qty 1

## 2017-10-16 NOTE — BHH Group Notes (Signed)
[place] [facility] Group Notes:  (Nursing/MHT/Case Management/Adjunct)  Date:  10/16/2017  Time:  10:31 AM  Type of Therapy:  Psychoeducational Skills  Participation Level:  Minimal  Participation Quality:  Resistant  Affect:  Resistant  Cognitive:  Disorganized  Insight:  Lacking  Engagement in Group:  Lacking  Modes of Intervention:  Problem-solving  Summary of Progress/Problems: Pt attended Psychoeducational group with topic healthy support system.   [name] 10/16/2017, 10:31 AM

## 2017-10-16 NOTE — BHH Group Notes (Signed)
[place] Group Notes:  (Nursing/MHT/Case Management/Adjunct)  Date:  10/16/2017  Time:  10:31 AM  Type of Therapy:  Orientation/Goals group  Participation Level:  Did Not Attend  Participation Quality:  Did Not Attend  Affect:  Did Not Attend  Cognitive:  Did Not Attend  Insight:  None  Engagement in Group:  Did Not Attend  Modes of Intervention:  Did Not Attend  Summary of Progress/Problems: Pt did not attend patient self inventory group/orientation group.  Benancio Deeds Shanta 10/16/2017, 10:31 AM

## 2017-10-16 NOTE — Progress Notes (Signed)
DAR Note:  Pt continue to be confused.  Pt observed in the dayroom not interacting. Pt continue to endorse auditory hallucinations; "I just continue to hear that woman talk." Pt also complained of moderate anxiety. Pt denied depression, pain or SI/HI. Pt was med compliant. All patient's questions and concerns addressed. Support, encouragement, and safe environment provided. 15-minute safety checks continue

## 2017-10-16 NOTE — Progress Notes (Signed)
Adult Psychoeducational Group Note  Date:  10/16/2017 Time:  1:31 AM  Group Topic/Focus:  Wrap-Up Group:   The focus of this group is to help patients review their daily goal of treatment and discuss progress on daily workbooks.  Participation Level:  Active  Participation Quality:  Appropriate  Affect:  Appropriate  Cognitive:  Appropriate  Insight: Appropriate  Engagement in Group:  Engaged  Modes of Intervention:  Discussion  Additional Comments:  Pt stated the goal for today was to commutate with his family. Pt stated he accomplished his goal and his father and brother even came by to visited him today. Pt rated his over all day a 6 out of 10. Pt stated he attend all groups held today.   Candy Sledge 10/16/2017, 1:31 AM

## 2017-10-16 NOTE — BHH Group Notes (Signed)
[facility] LCSW Group Therapy Note  Date/Time:  10/16/2017 11AM-12PM  Type of Therapy and Topic:  Group Therapy:  Healthy and Unhealthy Supports  Participation Level:  Did Not Attend   Description of Group:  Patients in this group were introduced to the idea of adding a variety of healthy supports to address the various needs in their lives.Patients discussed what additional healthy supports could be helpful in their recovery and wellness after discharge in order to prevent future hospitalizations.   An emphasis was placed on using counselor, doctor, therapy groups, 12-step groups, and problem-specific support groups to expand supports.  They also worked as a group on developing a specific plan for several patients to deal with unhealthy supports through [place], psychoeducation with loved ones, and even termination of relationships.   Therapeutic Goals:   1)  discuss importance of adding supports to stay well once out of the hospital  2)  compare healthy versus unhealthy supports and identify some examples of each  3)  generate ideas and descriptions of healthy supports that can be added  4)  offer mutual support about how to address unhealthy supports  5)  encourage active participation in and adherence to discharge plan    Summary of Patient Progress:  Patient was encouraged and invited to attend group. Patient did not attend group. Social worker will continue to encourage group participation in the future.    Therapeutic Modalities:   Motivational Interviewing Brief Solution-Focused Therapy  Darin Engels, LCSW @[facility]  @[facility] 

## 2017-10-16 NOTE — Progress Notes (Addendum)
[facility] MD Progress Note  10/16/2017 12:28 PM [name]  MRN:  [id number]   Subjective: [name] seen resting in bedroom.  Reports his mood is improving.  States his father and brother came to visit him on yesterday which gave him home to be discharging back home.  Reports slight depression.  Continues to to deny suicidal or homicidal ideation.  Denies thoughts of self-harm or self-injurious behaviors.   [name]  reports concerns with lower back pain due to the bed.  Discussed initiating the Lidoderm patch for symptoms.  NP ordered ammonia level-  pending results.  Patient denies nausea vomiting headaches.  Patient is alert and oriented to self, place and current events.  Reports a good appetite.  Support and encouragement and reassurance was provided.  History: Per admission assessment-Patient is seen and examined.  Patient is a 52 year old male with a past psychiatric history significant for alcohol dependence, probable depression and a past medical history significant for HIV positive as well as liver cirrhosis.  The patient stated he had been in his usual state of health until late 24-Sep-2017.  His mother died on 10/01/2022.  This was unexpected.  He was significantly depressed about this.  He drinks on a daily basis, but he stated he had began to drink more alcohol.  He is prescribed Xanax from 1 of his primary physicians.  He took an intentional overdose of the Xanax in conjunction with his alcohol intake.  He became frightened by the prospect of the lethal type of overdose, and contacted his family members.  They took him to the emergency room at [facility] on 4/21.  He was admitted at that time    Principal Problem: Korsakoff syndrome Diagnosis:   Patient Active Problem List   Diagnosis Date Noted  . 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 Alcohol use disorder, severe, dependence ([place]) [F10.20] 10/11/2017  . MDD (major depressive disorder), recurrent severe, without psychosis ([place]) [F33.2]   .  Alcoholism ([place]) [F10.20] 10/03/2017  . Obesity, Class III, BMI 40-49.9 (morbid obesity) ([place]) [E66.01] 10/03/2017  . Hepatic encephalopathy ([place]) [K72.90] 10/02/2017  . Suicidal ideation [R45.851]   . Secondary esophageal varices with bleeding (HCC) [I85.11]   . Venous stasis [I87.8] 03/02/2016  . Secondary esophageal varices without bleeding (HCC) [I85.10]   . Erectile dysfunction [N52.9] 07/14/2015  . Encephalopathy, hepatic ([place]) [K72.90] 01/23/2015  . Peripheral edema [R60.9] 12/31/2014  . Constipation [K59.00] 07/15/2014  . Fall [W19.XXXA] 01/07/2014  . Orthostatic hypotension [I95.1] 05/28/2013  . Personal history of failed moderate sedation- MUST HAVE MAC OR GENERAL [Z92.83] 05/01/2013  . Portal hypertensive gastropathy ([place]) [K76.6, K31.89] 01/02/2013  . Acquired pancytopenia ([place]) [J09.326] 05/19/2011  . Insomnia [G47.00] 12/21/2010  . Alcoholic cirrhosis of liver ([place]) [K70.30] 07/15/2010  . Idiopathic peripheral autonomic neuropathy [G90.09] 05/25/2007  . Idiopathic peripheral autonomic neuropathy, unspecified [G90.09] 05/25/2007  . SINUSITIS, CHRONIC MAXILLARY [J32.0] 03/06/2007  . Human immunodeficiency virus (HIV) disease ([place]) [B20] 06/23/2006  . HYPERLIPIDEMIA [E78.5] 06/23/2006  . Anxiety state [F41.1] 06/23/2006  . Depression [F32.9] 06/23/2006  . HYPERTENSION [I10] 06/23/2006  . Reflux esophagitis [K21.0] 06/23/2006   Total Time spent with patient: 30 minutes  Past Psychiatric History:  Past Medical History:  Past Medical History:  Diagnosis Date  . Alcoholic cirrhosis of liver ([place])   . Alcoholism, chronic ([place])   . ANXIETY 06/23/2006  . Ascites 11/13/2009  . Ascites   . Avulsion fracture of middle phalanges of  3rd/4th fingers left hand 01/07/2014  .  Blood dyscrasia    HIV  . Cellulitis of right lower extremity 03/02/2016  . DEPRESSION 06/23/2006  . 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 Erectile dysfunction 07/14/2015  . ERECTILE DYSFUNCTION, ORGANIC  07/11/2009  . Gastric ulcer 06/2010  . GERD (gastroesophageal reflux disease)   . HIV DISEASE 06/23/2006  . HYPERLIPIDEMIA 06/23/2006  . HYPERTENSION 06/23/2006   denies  . 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 Iron deficiency anemia   . Left foot infection   . Obesity (BMI 30-39.9)    BMI 34 kg/m^2  . Portal hypertensive gastropathy ([place]) 01/02/2013  . Recent shoulder injury    left shoulder/fell in parking lot/ no surgery/December 26, 2016  . SBP (spontaneous bacterial peritonitis) ([place]) 05/03/2011   Suspected by high leukocytes on paracentesis. Clinical scenario also compatible. November 2012 responded to Levaquin. Started on trimethoprim-sulfamethoxazole double strength daily for prophylaxis.   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 Skin cancer    left calf  . STRAIN, CHEST WALL 03/15/2007  . Varices, esophageal ([place]) 06/2010  . Venous stasis 03/02/2016    Past Surgical History:  Procedure Laterality Date  . CARPAL TUNNEL RELEASE     left  . COLONOSCOPY  march 2013  . ESOPHAGEAL BANDING  05/01/2013   Procedure: ESOPHAGEAL BANDING;  Surgeon: Gatha Mayer, MD;  Location: [facility];  Service: Endoscopy;;  . ESOPHAGOGASTRODUODENOSCOPY  07/01/2010;  08/12/10   small varices, gastric ulcer  . ESOPHAGOGASTRODUODENOSCOPY  10/26/2011   Procedure: ESOPHAGOGASTRODUODENOSCOPY (EGD);  Surgeon: Gatha Mayer, MD;  Location: [facility];  Service: Endoscopy;  Laterality: N/A;  . ESOPHAGOGASTRODUODENOSCOPY N/A 01/02/2013   Procedure: ESOPHAGOGASTRODUODENOSCOPY (EGD);  Surgeon: Gatha Mayer, MD;  Location: [facility];  Service: Endoscopy;  Laterality: N/A;  . ESOPHAGOGASTRODUODENOSCOPY N/A 04/23/2013   Procedure: ESOPHAGOGASTRODUODENOSCOPY (EGD);  Surgeon: Gatha Mayer, MD;  Location: [facility];  Service: Endoscopy;  Laterality: N/A;  . ESOPHAGOGASTRODUODENOSCOPY N/A 05/01/2013   Procedure: ESOPHAGOGASTRODUODENOSCOPY (EGD);  Surgeon: Gatha Mayer, MD;   Location: [facility];  Service: Endoscopy;  Laterality: N/A;  follow-up varices and possibly band them  . ESOPHAGOGASTRODUODENOSCOPY N/A 09/04/2013   Procedure: ESOPHAGOGASTRODUODENOSCOPY (EGD);  Surgeon: Gatha Mayer, MD;  Location: [facility];  Service: Endoscopy;  Laterality: N/A;  . ESOPHAGOGASTRODUODENOSCOPY N/A 09/10/2014   Procedure: ESOPHAGOGASTRODUODENOSCOPY (EGD);  Surgeon: Gatha Mayer, MD;  Location: [facility];  Service: Endoscopy;  Laterality: N/A;  . ESOPHAGOGASTRODUODENOSCOPY (EGD) WITH PROPOFOL N/A 08/21/2015   Procedure: ESOPHAGOGASTRODUODENOSCOPY (EGD) WITH PROPOFOL;  Surgeon: Gatha Mayer, MD;  Location: [facility];  Service: Endoscopy;  Laterality: N/A;  . ESOPHAGOGASTRODUODENOSCOPY (EGD) WITH PROPOFOL N/A 04/06/2017   Procedure: ESOPHAGOGASTRODUODENOSCOPY (EGD) WITH PROPOFOL;  Surgeon: Gatha Mayer, MD;  Location: [facility];  Service: Endoscopy;  Laterality: N/A;  . ESOPHAGOGASTRODUODENOSCOPY W/ BANDING  06/26/2010   variceal ligation  . GASTRIC VARICES BANDING N/A 01/02/2013   Procedure: GASTRIC VARICES BANDING;  Surgeon: Gatha Mayer, MD;  Location: [facility];  Service: Endoscopy;  Laterality: N/A;  possible banding  . UPPER GASTROINTESTINAL ENDOSCOPY     Family History:  Family History  Problem Relation Age of Onset  . Hyperlipidemia Mother   . Hypertension Mother   . Breast cancer Mother        questionable  . Heart disease Mother   . Hypertension Father   . Irritable bowel syndrome Father   . Drug abuse Brother   . Heart disease Brother   . Breast cancer Maternal Aunt  maternal great aunt  . Alcohol abuse Other   . Heart disease Maternal Uncle   . Irritable bowel syndrome Paternal Aunt   . Colon cancer Neg Hx    Family Psychiatric  History:  Social History:  Social History   Substance and Sexual Activity  Alcohol Use Yes   Comment: pt is an alcoholic currently in [place] meetings     Social History   Substance and  Sexual Activity  Drug Use No    Social History   Socioeconomic History  . Marital status: Single    Spouse name: Not on file  . Number of children: 0  . Years of education: Not on file  . Highest education level: Not on file  Occupational History  . Occupation: disability  Social Needs  . Financial resource strain: Not on file  . Food insecurity:    Worry: Not on file    Inability: Not on file  . Transportation needs:    Medical: Not on file    Non-medical: Not on file  Tobacco Use  . Smoking status: Former Smoker    Packs/day: 0.10    Years: 10.00    Pack years: 1.00    Types: Cigarettes    Start date: 03/03/2014    Last attempt to quit: 04/03/2014    Years since quitting: 3.5  . Smokeless tobacco: Never Used  . Tobacco comment: Chews nicorette gum.  Substance and Sexual Activity  . Alcohol use: Yes    Comment: pt is an alcoholic currently in [employer]  . Drug use: No  . Sexual activity: Yes    Partners: Male    Birth control/protection: Condom    Comment: pt. declined condoms  Lifestyle  . Physical activity:    Days per week: Not on file    Minutes per session: Not on file  . Stress: Not on file  Relationships  . Social connections:    Talks on phone: Not on file    Gets together: Not on file    Attends religious service: Not on file    Active member of club or organization: Not on file    Attends meetings of clubs or organizations: Not on file    Relationship status: Not on file  Other Topics Concern  . Not on file  Social History Narrative   Single, disabled hair stylist   Lives with parents in a home with a basement.  Parents do not like for him to go down stairs because they are steep.           Additional Social History:                         Sleep: Fair  Appetite:  Good  Current Medications: Current Facility-Administered Medications  Medication Dose Route Frequency Provider Last Rate Last Dose  . aMILoride (MIDAMOR)  tablet 15 mg  15 mg Oral Daily Rankin, Shuvon B, NP   15 mg at 10/16/17 1003  . benzocaine (ORAJEL) 10 % mucosal gel   Mouth/Throat BID PRN Lindon Romp A, NP      . diclofenac sodium (VOLTAREN) 1 % transdermal gel 2 g  2 g Topical QID Sharma Covert, MD   2 g at 10/16/17 1003  . dolutegravir (TIVICAY) tablet 50 mg  50 mg Oral Daily Rankin, Shuvon B, NP   50 mg at 10/16/17 1003  . emtricitabine-tenofovir AF (DESCOVY) 200-25 MG per tablet 1 tablet  1 tablet  Oral Daily Rankin, Shuvon B, NP   1 tablet at 10/16/17 1003  . escitalopram (LEXAPRO) tablet 5 mg  5 mg Oral Daily Pennelope Bracken, MD   5 mg at 10/16/17 1003  . furosemide (LASIX) tablet 40 mg  40 mg Oral Daily Rankin, Shuvon B, NP   40 mg at 10/16/17 1004  . hydrOXYzine (ATARAX/VISTARIL) tablet 50 mg  50 mg Oral Q6H PRN Pennelope Bracken, MD   50 mg at 10/14/17 2059  . ibuprofen (ADVIL,MOTRIN) tablet 400 mg  400 mg Oral Q6H PRN Sharma Covert, MD      . lactulose ([place]) 10 GM/15ML solution 20 g  20 g Oral TID Sharma Covert, MD   20 g at 10/16/17 1004  . lidocaine (LIDODERM) 5 % 1 patch  1 patch Transdermal Q24H Derrill Center, NP   1 patch at 10/16/17 1139  . nicotine polacrilex (NICORETTE) gum 2 mg  2 mg Oral PRN Sharma Covert, MD   2 mg at 10/12/17 1319  . pantoprazole (PROTONIX) EC tablet 40 mg  40 mg Oral Daily Rankin, Shuvon B, NP   40 mg at 10/16/17 1004  . potassium chloride SA (K-DUR,KLOR-CON) CR tablet 40 mEq  40 mEq Oral BID Rankin, Shuvon B, NP   40 mEq at 10/16/17 1004  . QUEtiapine (SEROQUEL) tablet 25 mg  25 mg Oral Q8H PRN Sharma Covert, MD      . QUEtiapine (SEROQUEL) tablet 50 mg  50 mg Oral QHS Sharma Covert, MD   50 mg at 10/15/17 2144  . rifaximin (XIFAXAN) tablet 550 mg  550 mg Oral BID Rankin, Shuvon B, NP   550 mg at 10/16/17 1005  . simethicone (MYLICON) chewable tablet 80 mg  80 mg Oral TID PRN Rankin, Shuvon B, NP      . thiamine (VITAMIN B-1) tablet 100 mg  100 mg Oral  Daily Pennelope Bracken, MD   100 mg at 10/16/17 1005  . traZODone (DESYREL) tablet 100 mg  100 mg Oral QHS,MR X 1 Laverle Hobby, PA-C   100 mg at 10/15/17 2144    Lab Results: No results found for this or any previous visit (from the past 48 hour(s)).  Blood Alcohol level:  Lab Results  Component Value Date   ETH <10 10/02/2017   ETH <5 16/03/9603    Metabolic Disorder Labs: No results found for: HGBA1C, MPG No results found for: PROLACTIN Lab Results  Component Value Date   CHOL 152 08/22/2017   TRIG 68 08/22/2017   HDL 56 08/22/2017   CHOLHDL 2.7 08/22/2017   VLDL 23 08/10/2016   LDLCALC 82 08/22/2017   LDLCALC 57 08/10/2016    Physical Findings: AIMS: Facial and Oral Movements Muscles of Facial Expression: None, normal Lips and Perioral Area: None, normal Jaw: None, normal Tongue: None, normal,Extremity Movements Upper (arms, wrists, hands, fingers): None, normal Lower (legs, knees, ankles, toes): None, normal, Trunk Movements Neck, shoulders, hips: None, normal, Overall Severity Severity of abnormal movements (highest score from questions above): None, normal Incapacitation due to abnormal movements: None, normal Patient's awareness of abnormal movements (rate only patient's report): No Awareness, Dental Status Current problems with teeth and/or dentures?: No Does patient usually wear dentures?: No  CIWA:    COWS:     Musculoskeletal: Strength & Muscle Tone: within normal limits Gait & Station: normal Patient leans: N/A  Psychiatric Specialty Exam: Physical Exam  Constitutional: He appears well-developed.  GI: Soft.  Review of Systems  Psychiatric/Behavioral: Positive for memory loss. Negative for depression and suicidal ideas. The patient has insomnia. The patient is not nervous/anxious.   All other systems reviewed and are negative.   Blood pressure (!) 128/58, pulse 74, temperature 98.5 F (36.9 C), temperature source Oral, resp. rate 18,  height 5' 11"  (1.803 m), weight (!) 147.9 kg (326 lb).Body mass index is 45.47 kg/m.  General Appearance: Casual  Eye Contact:  Minimal   Speech:  Clear and Coherent  Volume:  Normal  Mood:  Anxious and Depressed  Affect:  Congruent  Thought Process:  Coherent  Orientation:  Full (Time, Place, and Person)  Thought Content:  Logical and Hallucinations: None  Suicidal Thoughts:  No  Homicidal Thoughts:  No  Memory:  Immediate;   Fair Recent;   Fair Remote;   Fair  Judgement:  Fair  Insight:  Fair  Psychomotor Activity:  NA  Concentration:  Concentration: Fair  Recall:  [employer] of Knowledge:  Fair  Language:  Fair  Akathisia:  No  Handed:  Right  AIMS (if indicated):     Assets:  Communication Skills Desire for Improvement Social Support  ADL's:  Intact  Cognition:  WNL  Sleep:  Number of Hours: 6.25     Treatment Plan Summary: Daily contact with patient to assess and evaluate symptoms and progress in treatment and Medication management   Continue her current treatment plan listed below on 10/16/2017 except for noted  -MDD, recurrent, severe, without psychosis            - Continue lexapro 53m po qDay             -Continue seroquel 520mpo qhs  -Wernicke-Korsakoff Syndrome   - Continue thiamine 10063mo qDay   -SW investigating placement in memory care unit vs home health options due to severe cognitive impairment  -Anxiety           -Continue vistaril 31m55m q6h prn anxiety  -Agitation              -Continue seroquel 25mg95mq8h prn agitation  -Insomnia             -Continue trazodone 31mg 60mhs prn insomnia  -HIV              -Continue tivicay 31mg p51may             -Continue descovy 200-25mg pe11mblet, take 1 tablet daily  -Elevated ammonia              -Continue lactulose 20g TID   -Recheck ammonia on AM of 10/13/17- reviewed  downward trend. 44 uml/L will repeat on 10/16/2017  -Hepatic encephalopathy             -Continue rifaximin 531mg po 43m  -HTN              -Continue amiloride 15mg po q47m            - Continue lasix 40mg po qD42m-hypokalemia             -Continue  Potassium chloride 40 mEq po BID   -Recheck CMP on AM of 10/13/17- labs reviewed K 4.1 on   -GERD              -Continue protonix 40mg po qDa65mAcute pain              -Continue voltaren gel 1%, 2g  apply topically to affected area QID  -Encourage participation in groups and therapeutic milieu -Disposition planning will be ongoing    Derrill Center, NP 10/16/2017, 12:28 PM   ..Agree with NP Progress Note

## 2017-10-16 NOTE — Progress Notes (Signed)
Patient ID: [name], male   DOB: 1965-10-13, 52 y.o.   MRN: [id number]  Nursing Progress Note [id number]  Data: Patient presents with pleasant mood. Patient is cooperative with Probation officer and complaint with scheduled medications. Patient denies SI/HI/AVH. Patient contracts for safety on the unit at this time. Patient complains of chronic lower back pain and was provided first order of Lidocaine patch. Patient completed self-inventory sheet and rates depression, hopelessness, and anxiety fair/fair respectively. Patient rates their sleep and appetite as 0,0,0 respectively. Patient states goal for today is to "stop my muscle cramps". Patient provided but declined to complete their self-inventory sheet. Patient is seen up in the milieu. Patient does appear to have thought blocking at times but appears less confused. He speaks to Probation officer about grieving the loss of his mom and how he misses fresh fruit and vegetables from his home garden.  Action: Patient educated about and provided medication per provider's orders. Patient safety maintained with q15 min safety checks. High fall risk precautions in place. Emotional support given. 1:1 interaction and active listening provided. Patient encouraged to attend meals and groups. Labs, vital signs and patient behavior monitored throughout shift. Patient encouraged to work on treatment plan and goals.  Response: Patient remains safe on the unit at this time. Patient is interacting with peers appropriately on the unit. Will continue to support and monitor.

## 2017-10-17 NOTE — [facility] Group Notes (Signed)
LCSW Group Therapy Note   10/17/2017 1:15pm   Type of Therapy and Topic:  Group Therapy:  Overcoming Obstacles   Participation Level:  Minimal   Description of Group:    In this group patients will be encouraged to explore what they see as obstacles to their own wellness and recovery. They will be guided to discuss their thoughts, feelings, and behaviors related to these obstacles. The group will process together ways to cope with barriers, with attention given to specific choices patients can make. Each patient will be challenged to identify changes they are motivated to make in order to overcome their obstacles. This group will be process-oriented, with patients participating in exploration of their own experiences as well as giving and receiving support and challenge from other group members.   Therapeutic Goals: 1. Patient will identify personal and current obstacles as they relate to admission. 2. Patient will identify barriers that currently interfere with their wellness or overcoming obstacles.  3. Patient will identify feelings, thought process and behaviors related to these barriers. 4. Patient will identify two changes they are willing to make to overcome these obstacles:      Summary of Patient Progress   Stayed the entire time, appeared engaged throughout.  At one point stated he wants to move out of his place and back to [place], and talked about the work involved in packing up and moving.  "The key is to just focus on one box at a time."  Appeared more grounded in the present today, and able to thread together sequential thoughts that made sense.   Therapeutic Modalities:   Cognitive Behavioral Therapy Solution Focused Therapy Motivational Interviewing Relapse Prevention Therapy  Trish Mage, LCSW 10/17/2017 4:37 PM

## 2017-10-17 NOTE — Progress Notes (Signed)
Recreation Therapy Notes  Date: 5.6.19 Time: 1000 Location: 500 Hall Dayroom  Group Topic: Anger Management  Goal Area(s) Addresses:  Patient will identify triggers for anger.  Patient will identify physical reaction to anger.   Patient will identify benefit of using coping skills when angry.  Behavioral Response: Engaged  Intervention: Worksheet  Activity: Intro to Anger Management.  Patients were given a worksheet in which they were to identify what causes their anger, their reaction to anger and any problems they have encountered because of anger.  Education: Anger Management, Discharge Planning   Education Outcome: Acknowledges education/In group clarification offered/Needs additional education.   Clinical Observations/Feedback: Pt stated "friends talking about me, strangers talking about me and family garbage" leads to his anger.  Pt also explained anger leads to "drinking a lot, yelling and clean aggressively".  Pt stated the problems his anger has caused were "damaged relationships and missed deadlines".  Pt also stated he tenses up when angry.      [name], LRT/CTRS      [name] A 10/17/2017 11:36 AM

## 2017-10-17 NOTE — Progress Notes (Signed)
Patient ID: [name], male   DOB: 1965/10/14, 52 y.o.   MRN: [id number] DAR Note: Pt observed in the dayroom not interacting. Pt presents with a sad/flat affect. Pt at the time of assessment endorsed moderate chronic back pain. Pt also endorsed moderated depression and anxiety; "I can't wait till I go home." Pt denies SI/HI or AVH. Pt was med compliant. All patient's questions and concerns addressed. Support, encouragement, and safe environment provided. 15-minute safety checks continue. Pt attended wrap-up group.

## 2017-10-17 NOTE — Progress Notes (Signed)
Nursing Progress Note: 7p-7a D: Pt currently presents with a anxious/concrete affect and behavior. Pt states "I really want to go home." Interacting appropriately with the milieu. Pt reports good sleep during the previous night with current medication regimen. Pt did attend wrap-up group.  A: Pt provided with medications per providers orders. Pt's labs and vitals were monitored throughout the night. Pt supported emotionally and encouraged to express concerns and questions. Pt educated on medications.  R: Pt's safety ensured with 15 minute and environmental checks. Pt currently denies SI, HI, and AVH. Pt verbally contracts to seek staff if SI,HI, or AVH occurs and to consult with staff before acting on any harmful thoughts. Will continue to monitor.

## 2017-10-17 NOTE — Plan of Care (Signed)
  Problem: Safety: Goal: Periods of time without injury will increase 10/17/2017 1518 by Vela Prose, RN Outcome: Progressing 10/17/2017 1456 by Vela Prose, RN Outcome: Progressing   Problem: Education: Goal: Mental status will improve Outcome: Progressing   Problem: Health Behavior/Discharge Planning: Goal: Compliance with therapeutic regimen will improve Outcome: Progressing   Problem: Self-Concept: Goal: Level of anxiety will decrease Outcome: Progressing

## 2017-10-17 NOTE — Progress Notes (Signed)
[facility] MD Progress Note  10/17/2017 4:55 PM [name]  MRN:  [id number]  Subjective: [name] reports, "I'm ready to go home & sleep on my own bed. My mood is okay. I'm hopeful. I normally am still some depression, the holidays & stuff like that. I'm like this girl that talks to herself. I'm like, what a heck".  [name] is a 52 y/o M with history of MDD, alcohol use disorder, HIV, hepatic cirrhosis, and recently elevated ammonia with hepatic encephalopathy who was admitted after intentional overdose of xanax and alcohol. Pt was found to have elevated ammonia, and it remains elevated during this admission despite trending down with use of lactulose. He was transferred to [facility] for additional treatment and stabilization of his psychiatric symptoms. He was continued on home medication of amitriptyline and monitored on the inpatient unit. Pt has been reporting improvement of his mood symptoms; however, he has episodic confusion and disorientation. Yesterday evening he disrobed and was confused, but he was able to be redirected. Due to his behaviors he was transferred to the 500 hall. He was changed from amitriptyline to lexapro due reduced sedation and he was discontinued from ativan. Pt has been reporting improvement of his mood symptoms, but he remains confused and disoriented on the unit.  Staff reports that patient remains very confused, needing a lot of re-direction.  Today 10-17-17,  During this follow-up care assessment, pt presents pleasant and cooperative. He started by saying that he is doing well, ready to go home to sleep in his bed. He says the hospital bed is very uncomfortable to sleep in. Then, he says his mood is okay & he feels hopeful. It was after this last statement that patient becomes more circumstantial/tangential with his responses. He jumped from talking about his mood & how he has been doing to "I normally am depressed, the holidays & stuff like that. I'm like this girl that  talks to herself. I'm like what a heck". [name] is visible on the unit, attending group sessions & activities. He remains confused. He is taking & tolerating his treatment regimen, denies any side effects. His Ammonia level of 10-16-17 was 83. He has agreed to continue current plan of care already in progress.  Principal Problem: Korsakoff syndrome Diagnosis:   Patient Active Problem List   Diagnosis Date Noted  . 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 Alcohol use disorder, severe, dependence ([place]) [F10.20] 10/11/2017  . MDD (major depressive disorder), recurrent severe, without psychosis ([place]) [F33.2]   . Alcoholism ([place]) [F10.20] 10/03/2017  . Obesity, Class III, BMI 40-49.9 (morbid obesity) ([place]) [E66.01] 10/03/2017  . Hepatic encephalopathy ([place]) [K72.90] 10/02/2017  . Suicidal ideation [R45.851]   . Secondary esophageal varices with bleeding (HCC) [I85.11]   . Venous stasis [I87.8] 03/02/2016  . Secondary esophageal varices without bleeding (HCC) [I85.10]   . Erectile dysfunction [N52.9] 07/14/2015  . Encephalopathy, hepatic ([place]) [K72.90] 01/23/2015  . Peripheral edema [R60.9] 12/31/2014  . Constipation [K59.00] 07/15/2014  . Fall [W19.XXXA] 01/07/2014  . Orthostatic hypotension [I95.1] 05/28/2013  . Personal history of failed moderate sedation- MUST HAVE MAC OR GENERAL [Z92.83] 05/01/2013  . Portal hypertensive gastropathy ([place]) [K76.6, K31.89] 01/02/2013  . Acquired pancytopenia ([place]) [N36.144] 05/19/2011  . Insomnia [G47.00] 12/21/2010  . Alcoholic cirrhosis of liver ([place]) [K70.30] 07/15/2010  . Idiopathic peripheral autonomic neuropathy [G90.09] 05/25/2007  . Idiopathic peripheral autonomic neuropathy, unspecified [G90.09] 05/25/2007  . SINUSITIS, CHRONIC MAXILLARY [J32.0] 03/06/2007  . Human immunodeficiency virus (HIV) disease ([place]) [B20]  06/23/2006  . HYPERLIPIDEMIA [E78.5] 06/23/2006  . Anxiety state [F41.1] 06/23/2006  . Depression [F32.9] 06/23/2006  . HYPERTENSION  [I10] 06/23/2006  . Reflux esophagitis [K21.0] 06/23/2006   Total Time spent with patient: 15 minutes  Past Psychiatric History: see H&P  Past Medical History:  Past Medical History:  Diagnosis Date  . Alcoholic cirrhosis of liver ([place])   . Alcoholism, chronic ([place])   . ANXIETY 06/23/2006  . Ascites 11/13/2009  . Ascites   . Avulsion fracture of middle phalanges of  3rd/4th fingers left hand 01/07/2014  . Blood dyscrasia    HIV  . Cellulitis of right lower extremity 03/02/2016  . DEPRESSION 06/23/2006  . 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 Erectile dysfunction 07/14/2015  . 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 Gastric ulcer 06/2010  . GERD (gastroesophageal reflux disease)   . HIV DISEASE 06/23/2006  . HYPERLIPIDEMIA 06/23/2006  . HYPERTENSION 06/23/2006   denies  . 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 Iron deficiency anemia   . Left foot infection   . Obesity (BMI 30-39.9)    BMI 34 kg/m^2  . Portal hypertensive gastropathy ([place]) 01/02/2013  . Recent shoulder injury    left shoulder/fell in parking lot/ no surgery/December 26, 2016  . SBP (spontaneous bacterial peritonitis) ([place]) 05/03/2011   Suspected by high leukocytes on paracentesis. Clinical scenario also compatible. November 2012 responded to Levaquin. Started on trimethoprim-sulfamethoxazole double strength daily for prophylaxis.   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 Skin cancer    left calf  . STRAIN, CHEST WALL 03/15/2007  . Varices, esophageal ([place]) 06/2010  . Venous stasis 03/02/2016    Past Surgical History:  Procedure Laterality Date  . CARPAL TUNNEL RELEASE     left  . COLONOSCOPY  march 2013  . ESOPHAGEAL BANDING  05/01/2013   Procedure: ESOPHAGEAL BANDING;  Surgeon: Gatha Mayer, MD;  Location: [facility] ENDOSCOPY;  Service: Endoscopy;;  . ESOPHAGOGASTRODUODENOSCOPY  07/01/2010;  08/12/10   small varices, gastric ulcer  . ESOPHAGOGASTRODUODENOSCOPY  10/26/2011   Procedure:  ESOPHAGOGASTRODUODENOSCOPY (EGD);  Surgeon: Gatha Mayer, MD;  Location: [facility];  Service: Endoscopy;  Laterality: N/A;  . ESOPHAGOGASTRODUODENOSCOPY N/A 01/02/2013   Procedure: ESOPHAGOGASTRODUODENOSCOPY (EGD);  Surgeon: Gatha Mayer, MD;  Location: [facility];  Service: Endoscopy;  Laterality: N/A;  . ESOPHAGOGASTRODUODENOSCOPY N/A 04/23/2013   Procedure: ESOPHAGOGASTRODUODENOSCOPY (EGD);  Surgeon: Gatha Mayer, MD;  Location: [facility];  Service: Endoscopy;  Laterality: N/A;  . ESOPHAGOGASTRODUODENOSCOPY N/A 05/01/2013   Procedure: ESOPHAGOGASTRODUODENOSCOPY (EGD);  Surgeon: Gatha Mayer, MD;  Location: [facility];  Service: Endoscopy;  Laterality: N/A;  follow-up varices and possibly band them  . ESOPHAGOGASTRODUODENOSCOPY N/A 09/04/2013   Procedure: ESOPHAGOGASTRODUODENOSCOPY (EGD);  Surgeon: Gatha Mayer, MD;  Location: [facility];  Service: Endoscopy;  Laterality: N/A;  . ESOPHAGOGASTRODUODENOSCOPY N/A 09/10/2014   Procedure: ESOPHAGOGASTRODUODENOSCOPY (EGD);  Surgeon: Gatha Mayer, MD;  Location: [facility];  Service: Endoscopy;  Laterality: N/A;  . ESOPHAGOGASTRODUODENOSCOPY (EGD) WITH PROPOFOL N/A 08/21/2015   Procedure: ESOPHAGOGASTRODUODENOSCOPY (EGD) WITH PROPOFOL;  Surgeon: Gatha Mayer, MD;  Location: [facility];  Service: Endoscopy;  Laterality: N/A;  . ESOPHAGOGASTRODUODENOSCOPY (EGD) WITH PROPOFOL N/A 04/06/2017   Procedure: ESOPHAGOGASTRODUODENOSCOPY (EGD) WITH PROPOFOL;  Surgeon: Gatha Mayer, MD;  Location: [facility];  Service: Endoscopy;  Laterality: N/A;  . ESOPHAGOGASTRODUODENOSCOPY W/ BANDING  06/26/2010   variceal ligation  . GASTRIC VARICES BANDING N/A 01/02/2013   Procedure: GASTRIC VARICES BANDING;  Surgeon: Gatha Mayer, MD;  Location: [facility] ENDOS[facility];  Service: Endoscopy;  Laterality: N/A;  possible banding  . UPPER GASTROINTESTINAL ENDOSCOPY     Family History:  Family History  Problem Relation Age of Onset  . Hyperlipidemia  Mother   . Hypertension Mother   . Breast cancer Mother        questionable  . Heart disease Mother   . Hypertension Father   . Irritable bowel syndrome Father   . Drug abuse Brother   . Heart disease Brother   . Breast cancer Maternal Aunt        maternal great aunt  . Alcohol abuse Other   . Heart disease Maternal Uncle   . Irritable bowel syndrome Paternal Aunt   . Colon cancer Neg Hx    Family Psychiatric  History: see H&P Social History:  Social History   Substance and Sexual Activity  Alcohol Use Yes   Comment: pt is an alcoholic currently in [employer] meetings     Social History   Substance and Sexual Activity  Drug Use No    Social History   Socioeconomic History  . Marital status: Single    Spouse name: Not on file  . Number of children: 0  . Years of education: Not on file  . Highest education level: Not on file  Occupational History  . Occupation: disability  Social Needs  . Financial resource strain: Not on file  . Food insecurity:    Worry: Not on file    Inability: Not on file  . Transportation needs:    Medical: Not on file    Non-medical: Not on file  Tobacco Use  . Smoking status: Former Smoker    Packs/day: 0.10    Years: 10.00    Pack years: 1.00    Types: Cigarettes    Start date: 03/03/2014    Last attempt to quit: 04/03/2014    Years since quitting: 3.5  . Smokeless tobacco: Never Used  . Tobacco comment: Chews nicorette gum.  Substance and Sexual Activity  . Alcohol use: Yes    Comment: pt is an alcoholic currently in [employer]  . Drug use: No  . Sexual activity: Yes    Partners: Male    Birth control/protection: Condom    Comment: pt. declined condoms  Lifestyle  . Physical activity:    Days per week: Not on file    Minutes per session: Not on file  . Stress: Not on file  Relationships  . Social connections:    Talks on phone: Not on file    Gets together: Not on file    Attends religious service: Not on  file    Active member of club or organization: Not on file    Attends meetings of clubs or organizations: Not on file    Relationship status: Not on file  Other Topics Concern  . Not on file  Social History Narrative   Single, disabled hair stylist   Lives with parents in a home with a basement.  Parents do not like for him to go down stairs because they are steep.           Additional Social History:   Sleep: Fair  Appetite:  Good  Current Medications: Current Facility-Administered Medications  Medication Dose Route Frequency Provider Last Rate Last Dose  . aMILoride (MIDAMOR) tablet 15 mg  15 mg Oral Daily Rankin, Shuvon B, NP   15 mg at 10/17/17 0931  . benzocaine (ORAJEL) 10 % mucosal gel  Mouth/Throat BID PRN Lindon Romp A, NP      . diclofenac sodium (VOLTAREN) 1 % transdermal gel 2 g  2 g Topical QID Sharma Covert, MD   2 g at 10/17/17 1623  . dolutegravir (TIVICAY) tablet 50 mg  50 mg Oral Daily Rankin, Shuvon B, NP   50 mg at 10/17/17 0931  . emtricitabine-tenofovir AF (DESCOVY) 200-25 MG per tablet 1 tablet  1 tablet Oral Daily Rankin, Shuvon B, NP   1 tablet at 10/17/17 0931  . escitalopram (LEXAPRO) tablet 5 mg  5 mg Oral Daily Pennelope Bracken, MD   5 mg at 10/17/17 0932  . furosemide (LASIX) tablet 40 mg  40 mg Oral Daily Rankin, Shuvon B, NP   40 mg at 10/17/17 0932  . hydrOXYzine (ATARAX/VISTARIL) tablet 50 mg  50 mg Oral Q6H PRN Pennelope Bracken, MD   50 mg at 10/14/17 2059  . ibuprofen (ADVIL,MOTRIN) tablet 400 mg  400 mg Oral Q6H PRN Sharma Covert, MD   400 mg at 10/16/17 2135  . lactulose (CHRONULAC) 10 GM/15ML solution 20 g  20 g Oral TID Sharma Covert, MD   20 g at 10/17/17 1623  . lidocaine (LIDODERM) 5 % 1 patch  1 patch Transdermal Q24H Derrill Center, NP   1 patch at 10/17/17 0945  . nicotine polacrilex (NICORETTE) gum 2 mg  2 mg Oral PRN Sharma Covert, MD   2 mg at 10/17/17 1628  . pantoprazole (PROTONIX) EC tablet 40  mg  40 mg Oral Daily Rankin, Shuvon B, NP   40 mg at 10/17/17 0932  . potassium chloride SA (K-DUR,KLOR-CON) CR tablet 40 mEq  40 mEq Oral BID Rankin, Shuvon B, NP   40 mEq at 10/17/17 1623  . QUEtiapine (SEROQUEL) tablet 25 mg  25 mg Oral Q8H PRN Sharma Covert, MD      . QUEtiapine (SEROQUEL) tablet 50 mg  50 mg Oral QHS Sharma Covert, MD   50 mg at 10/16/17 2133  . rifaximin (XIFAXAN) tablet 550 mg  550 mg Oral BID Rankin, Shuvon B, NP   550 mg at 10/17/17 1625  . simethicone (MYLICON) chewable tablet 80 mg  80 mg Oral TID PRN Rankin, Shuvon B, NP      . thiamine (VITAMIN B-1) tablet 100 mg  100 mg Oral Daily Pennelope Bracken, MD   100 mg at 10/17/17 0932  . traZODone (DESYREL) tablet 100 mg  100 mg Oral QHS,MR X 1 Laverle Hobby, PA-C   100 mg at 10/16/17 2247   Lab Results:  Results for orders placed or performed during the hospital encounter of 10/05/17 (from the past 48 hour(s))  Ammonia     Status: Abnormal   Collection Time: 10/16/17  6:50 PM  Result Value Ref Range   Ammonia 83 (H) 9 - 35 umol/L    Comment: Performed at [facility], [place] [address], [place], [place] [address]   Blood Alcohol level:  Lab Results  Component Value Date   ETH <10 10/02/2017   ETH <5 94/76/5465   Metabolic Disorder Labs: No results found for: HGBA1C, MPG No results found for: PROLACTIN Lab Results  Component Value Date   CHOL 152 08/22/2017   TRIG 68 08/22/2017   HDL 56 08/22/2017   CHOLHDL 2.7 08/22/2017   VLDL 23 08/10/2016   LDLCALC 82 08/22/2017   LDLCALC 57 08/10/2016   Physical Findings: AIMS: Facial and Oral  Movements Muscles of Facial Expression: None, normal Lips and Perioral Area: None, normal Jaw: None, normal Tongue: None, normal,Extremity Movements Upper (arms, wrists, hands, fingers): None, normal Lower (legs, knees, ankles, toes): None, normal, Trunk Movements Neck, shoulders, hips: None, normal, Overall Severity Severity of  abnormal movements (highest score from questions above): None, normal Incapacitation due to abnormal movements: None, normal Patient's awareness of abnormal movements (rate only patient's report): No Awareness, Dental Status Current problems with teeth and/or dentures?: No Does patient usually wear dentures?: No  CIWA:    COWS:     Musculoskeletal: Strength & Muscle Tone: within normal limits Gait & Station: normal Patient leans: N/A  Psychiatric Specialty Exam: Physical Exam  Nursing note and vitals reviewed.   Review of Systems  Constitutional: Negative for chills and fever.  Respiratory: Negative for cough and shortness of breath.   Cardiovascular: Negative for chest pain.  Gastrointestinal: Negative for abdominal pain, heartburn, nausea and vomiting.  Psychiatric/Behavioral: Positive for memory loss. Negative for depression, hallucinations and suicidal ideas. The patient is not nervous/anxious and does not have insomnia.     Blood pressure (!) 128/58, pulse 74, temperature 98.5 F (36.9 C), temperature source Oral, resp. rate 18, height _0  (1.803 m), weight (!) 147.9 kg (326 lb).Body mass index is 45.47 kg/m.  General Appearance: Casual and Fairly Groomed  Eye Contact:  Good  Speech:  Clear and Coherent and Normal Rate  Volume:  Normal  Mood:  Euthymic  Affect:  Congruent and Constricted  Thought Process:  Disorganized and Goal Directed  Orientation:  Other:  oriented only to year and city  Thought Content:  confabulation  Suicidal Thoughts:  No  Homicidal Thoughts:  No  Memory:  Immediate;   Poor Recent;   Poor Remote;   Poor  Judgement:  Impaired  Insight:  Lacking  Psychomotor Activity:  Normal  Concentration:  Concentration: Poor  Recall:  Poor  Fund of Knowledge:  Fair  Language:  Poor  Akathisia:  No  Handed:    AIMS (if indicated):     Assets:  Resilience  ADL's:  Intact  Cognition:  WNL  Sleep:  Number of Hours: 6.25   Treatment Plan  Summary: Daily contact with patient to assess and evaluate symptoms and progress in treatment and Medication management   -Continue inpatient hospitalization.  -Will continue today 10/17/2017 plan as below except where it is noted.  -MDD, recurrent, severe, without psychosis            - Continue lexapro 61m po qDay            - Continue seroquel 561mpo qhs  -Wernicke-Korsakoff Syndrome   - Continue thiamine 10026mo qDay   - SW investigating placement in memory care unit vs home health options due to severe cognitive impairment  -Anxiety           -Continue vistaril 61m40m q6h prn anxiety  -Agitation              -Continue seroquel 25mg20mq8h prn agitation  -Insomnia             -Continue trazodone 61mg 1mhs prn insomnia  -HIV              -Continue tivicay 61mg p11may             -Continue descovy 200-25mg pe41mblet, take 1 tablet daily  -Elevated ammonia              -  Continue lactulose 20g TID   -Reviewed result of the ammonia level done on AM of 10/16/17, at 83, threading up from the last level of 10-13-17 at 44.  -Hepatic encephalopathy             -Continue rifaximin 535m po BID  -HTN              -Continue amiloride 159mpo qDay             - Continue lasix 4048mo qDay  -hypokalemia             -Continue  Potassium chloride 40 mEq po BID   -Reviewed CMP on AM of 10/13/17.  -GERD              -Continue protonix 71m61m qDay  -Acute pain              -Continue voltaren gel 1%, 2g apply topically to affected area QID  -Encourage participation in groups and therapeutic milieu  -Disposition planning will be ongoing  AgneLindell [name], PMHNP, FNP-BC 10/17/2017, 4:55 PMPatient ID: StevVladimir Creeksle   DOB: [birth year] y[birth year].   MRN: [id number]

## 2017-10-17 NOTE — Progress Notes (Signed)
Adult Psychoeducational Group Note  Date:  10/17/2017 Time:  9:26 PM  Group Topic/Focus:  Wrap-Up Group:   The focus of this group is to help patients review their daily goal of treatment and discuss progress on daily workbooks.  Participation Level:  Active  Participation Quality:  Appropriate  Affect:  Appropriate  Cognitive:  Appropriate  Insight: Appropriate  Engagement in Group:  Engaged  Modes of Intervention:  Discussion  Additional Comments:  The patient expressed that he attend groups.The patient also said that rates today a 8 and is ready for discharge.  Nash Shearer 10/17/2017, 9:26 PM
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
## 2017-10-18 MED ORDER — TUBERCULIN PPD 5 UNIT/0.1ML ID SOLN
5.0000 [IU] | Freq: Once | INTRADERMAL | Status: AC
  Administered 2017-10-18: 5 [IU] via INTRADERMAL

## 2017-10-18 NOTE — Plan of Care (Signed)
  Problem: Education: Goal: Mental status will improve Outcome: Progressing   Problem: Safety: Goal: Periods of time without injury will increase Outcome: Progressing   Problem: Self-Concept: Goal: Level of anxiety will decrease Outcome: Progressing  DAR NOTE: Patient appears calm and pleasant.  Denies suicidal thoughts, pain, auditory and visual hallucinations.  Rates depression at 5, hopelessness at 7, and anxiety at 0.  Maintained on routine safety checks.  Medications given as prescribed.  Support and encouragement offered as needed.  Attended group and participated.  States goal for today is discharge and set up new place."  Patient visible in milieu for therapy and activities.  PPD placed on left forearm.  Offered no complaint.

## 2017-10-18 NOTE — Progress Notes (Signed)
Nursing Progress Note: 7p-7a D: Pt currently presents with a anxious/concrete affect and behavior. Pt states "I want to go home on Thursday instead of Friday." Interacting appropriately with the milieu. Pt reports good sleep during the previous night with current medication regimen. Pt did attend wrap-up group.  A: Pt provided with medications per providers orders. Pt's labs and vitals were monitored throughout the night. Pt supported emotionally and encouraged to express concerns and questions. Pt educated on medications.  R: Pt's safety ensured with 15 minute and environmental checks. Pt currently denies SI, HI, and AVH. Pt verbally contracts to seek staff if SI,HI, or AVH occurs and to consult with staff before acting on any harmful thoughts. Will continue to monitor.

## 2017-10-18 NOTE — [facility] Group Notes (Signed)
LCSW Group Therapy Note  10/18/2017 1:15pm  Type of Therapy/Topic:  Group Therapy:  Feelings about Diagnosis  Participation Level:  Active   Description of Group:   This group will allow patients to explore their thoughts and feelings about diagnoses they have received. Patients will be guided to explore their level of understanding and acceptance of these diagnoses. Facilitator will encourage patients to process their thoughts and feelings about the reactions of others to their diagnosis and will guide patients in identifying ways to discuss their diagnosis with significant others in their lives. This group will be process-oriented, with patients participating in exploration of their own experiences, giving and receiving support, and processing challenge from other group members.   Therapeutic Goals: 1. Patient will demonstrate understanding of diagnosis as evidenced by identifying two or more symptoms of the disorder 2. Patient will be able to express two feelings regarding the diagnosis 3. Patient will demonstrate their ability to communicate their needs through discussion and/or role play  Summary of Patient Progress:  Left once, but returned. Engaged throughout, and contributed with thoughts in a linear fashion today.  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 Also talked about his family as supportive.     Therapeutic Modalities:   Cognitive Behavioral Therapy Brief Therapy Feelings Identification    Trish Mage, LCSW 10/18/2017 3:00 PM

## 2017-10-18 NOTE — Progress Notes (Signed)
Recreation Therapy Notes  Date: 5.7.19 Time: 0945 Location: 500 Hall Dayroom  Group Topic: Wellness  Goal Area(s) Addresses:  Patient will define components of whole wellness. Patient will verbalize benefit of whole wellness.  Behavioral Response: Engaged  Intervention: Music  Activity: Exercise.  LRT led group in four rounds of exercises.  LRT let each patient pick an exercise to lead the group in.  Education:Wellness, Discharge Planning.   Education Outcome: Acknowledges education/In group clarification offered/Needs additional education.   Clinical Observations/Feedback:  Pt stated exercise helps with blood circulation.  Pt completed the exercises to the best of his ability.  Pt was social with peers and bright.   [name], LRT/CTRS      Ria Comment, [name] 10/18/2017 11:05 AM

## 2017-10-18 NOTE — Progress Notes (Signed)
[facility] MD Progress Note  10/18/2017 3:22 PM DAJAUN GOLDRING  MRN:  [id number] Subjective:    [name] is a 52 y/o M with history of MDD, alcohol use disorder, HIV, hepatic cirrhosis, and recently elevated ammonia with hepatic encephalopathy who was admitted after intentional overdose of xanax and alcohol in context of increased depression related to death of his mother unexpectedly on 3/31. Pt was found to have elevated ammonia, and it remains elevated during this admission despite trending down with use of lactulose. He was transferred to [facility] for additional treatment and stabilization of his psychiatric symptoms. He was continued on home medication of amitriptyline and monitored on the inpatient unit. Pt has been reporting improvement of his mood symptoms; however, he has episodic confusion and disorientation. Yesterday evening he disrobed and was confused, but he was able to be redirected. Due to his behaviors he was transferred to the 500 hall. He was changed from amitriptyline to lexapro due reduced sedation and he was discontinued from ativan. Pt has been reporting improvement of his mood symptoms, but he remains confused and disoriented on the unit at times; however, he is able to be redirected.  Today upon evaluation, pt shares, "I'm good. Things are better than a month ago." He reports that his mood has been improving but he continues to endorse some anxiety and depression. He denies any specific concerns today. He denies SI/HI/AH/VH. He endorses some anxiety, and he shares, "I was going to ask for some ativan, but I decided against it." Pt has improved insight about the impact of his drinking and abuse of benzodiazepines in contributing to his worsened mood symptoms and worsened memory problems. Pt has improved linear thinking and ability to track during the interview. He is more alert compared to previous interactions. He is oriented to date/year and location today. He is able to correctly name 3/3  items on 5 minute recall. He is in agreement to continue his current treatment regimen.  Principal Problem: Korsakoff syndrome Diagnosis:   Patient Active Problem List   Diagnosis Date Noted  . 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 Alcohol use disorder, severe, dependence ([place]) [F10.20] 10/11/2017  . MDD (major depressive disorder), recurrent severe, without psychosis ([place]) [F33.2]   . Alcoholism ([place]) [F10.20] 10/03/2017  . Obesity, Class III, BMI 40-49.9 (morbid obesity) ([place]) [E66.01] 10/03/2017  . Hepatic encephalopathy ([place]) [K72.90] 10/02/2017  . Suicidal ideation [R45.851]   . Secondary esophageal varices with bleeding (HCC) [I85.11]   . Venous stasis [I87.8] 03/02/2016  . Secondary esophageal varices without bleeding (HCC) [I85.10]   . Erectile dysfunction [N52.9] 07/14/2015  . Encephalopathy, hepatic ([place]) [K72.90] 01/23/2015  . Peripheral edema [R60.9] 12/31/2014  . Constipation [K59.00] 07/15/2014  . Fall [W19.XXXA] 01/07/2014  . Orthostatic hypotension [I95.1] 05/28/2013  . Personal history of failed moderate sedation- MUST HAVE MAC OR GENERAL [Z92.83] 05/01/2013  . Portal hypertensive gastropathy ([place]) [K76.6, K31.89] 01/02/2013  . Acquired pancytopenia ([place]) [P10.258] 05/19/2011  . Insomnia [G47.00] 12/21/2010  . Alcoholic cirrhosis of liver ([place]) [K70.30] 07/15/2010  . Idiopathic peripheral autonomic neuropathy [G90.09] 05/25/2007  . Idiopathic peripheral autonomic neuropathy, unspecified [G90.09] 05/25/2007  . SINUSITIS, CHRONIC MAXILLARY [J32.0] 03/06/2007  . Human immunodeficiency virus (HIV) disease ([place]) [B20] 06/23/2006  . HYPERLIPIDEMIA [E78.5] 06/23/2006  . Anxiety state [F41.1] 06/23/2006  . Depression [F32.9] 06/23/2006  . HYPERTENSION [I10] 06/23/2006  . Reflux esophagitis [K21.0] 06/23/2006   Total Time spent with patient: 30 minutes  Past Psychiatric History: See H&P  Past Medical History:  Past Medical History:  Diagnosis Date  . Alcoholic  cirrhosis of liver ([place])   . Alcoholism, chronic ([place])   . ANXIETY 06/23/2006  . Ascites 11/13/2009  . Ascites   . Avulsion fracture of middle phalanges of  3rd/4th fingers left hand 01/07/2014  . Blood dyscrasia    HIV  . Cellulitis of right lower extremity 03/02/2016  . DEPRESSION 06/23/2006  . 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 Erectile dysfunction 07/14/2015  . 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 Gastric ulcer 06/2010  . GERD (gastroesophageal reflux disease)   . HIV DISEASE 06/23/2006  . HYPERLIPIDEMIA 06/23/2006  . HYPERTENSION 06/23/2006   denies  . 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 Iron deficiency anemia   . Left foot infection   . Obesity (BMI 30-39.9)    BMI 34 kg/m^2  . Portal hypertensive gastropathy ([place]) 01/02/2013  . Recent shoulder injury    left shoulder/fell in parking lot/ no surgery/December 26, 2016  . SBP (spontaneous bacterial peritonitis) ([place]) 05/03/2011   Suspected by high leukocytes on paracentesis. Clinical scenario also compatible. November 2012 responded to Levaquin. Started on trimethoprim-sulfamethoxazole double strength daily for prophylaxis.   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 Skin cancer    left calf  . STRAIN, CHEST WALL 03/15/2007  . Varices, esophageal ([place]) 06/2010  . Venous stasis 03/02/2016    Past Surgical History:  Procedure Laterality Date  . CARPAL TUNNEL RELEASE     left  . COLONOSCOPY  march 2013  . ESOPHAGEAL BANDING  05/01/2013   Procedure: ESOPHAGEAL BANDING;  Surgeon: Gatha Mayer, MD;  Location: [facility];  Service: Endoscopy;;  . ESOPHAGOGASTRODUODENOSCOPY  07/01/2010;  08/12/10   small varices, gastric ulcer  . ESOPHAGOGASTRODUODENOSCOPY  10/26/2011   Procedure: ESOPHAGOGASTRODUODENOSCOPY (EGD);  Surgeon: Gatha Mayer, MD;  Location: [facility];  Service: Endoscopy;  Laterality: N/A;  . ESOPHAGOGASTRODUODENOSCOPY N/A 01/02/2013   Procedure: ESOPHAGOGASTRODUODENOSCOPY (EGD);   Surgeon: Gatha Mayer, MD;  Location: [facility];  Service: Endoscopy;  Laterality: N/A;  . ESOPHAGOGASTRODUODENOSCOPY N/A 04/23/2013   Procedure: ESOPHAGOGASTRODUODENOSCOPY (EGD);  Surgeon: Gatha Mayer, MD;  Location: [facility];  Service: Endoscopy;  Laterality: N/A;  . ESOPHAGOGASTRODUODENOSCOPY N/A 05/01/2013   Procedure: ESOPHAGOGASTRODUODENOSCOPY (EGD);  Surgeon: Gatha Mayer, MD;  Location: [facility];  Service: Endoscopy;  Laterality: N/A;  follow-up varices and possibly band them  . ESOPHAGOGASTRODUODENOSCOPY N/A 09/04/2013   Procedure: ESOPHAGOGASTRODUODENOSCOPY (EGD);  Surgeon: Gatha Mayer, MD;  Location: [facility];  Service: Endoscopy;  Laterality: N/A;  . ESOPHAGOGASTRODUODENOSCOPY N/A 09/10/2014   Procedure: ESOPHAGOGASTRODUODENOSCOPY (EGD);  Surgeon: Gatha Mayer, MD;  Location: [facility];  Service: Endoscopy;  Laterality: N/A;  . ESOPHAGOGASTRODUODENOSCOPY (EGD) WITH PROPOFOL N/A 08/21/2015   Procedure: ESOPHAGOGASTRODUODENOSCOPY (EGD) WITH PROPOFOL;  Surgeon: Gatha Mayer, MD;  Location: [facility];  Service: Endoscopy;  Laterality: N/A;  . ESOPHAGOGASTRODUODENOSCOPY (EGD) WITH PROPOFOL N/A 04/06/2017   Procedure: ESOPHAGOGASTRODUODENOSCOPY (EGD) WITH PROPOFOL;  Surgeon: Gatha Mayer, MD;  Location: [facility];  Service: Endoscopy;  Laterality: N/A;  . ESOPHAGOGASTRODUODENOSCOPY W/ BANDING  06/26/2010   variceal ligation  . GASTRIC VARICES BANDING N/A 01/02/2013   Procedure: GASTRIC VARICES BANDING;  Surgeon: Gatha Mayer, MD;  Location: [facility];  Service: Endoscopy;  Laterality: N/A;  possible banding  . UPPER GASTROINTESTINAL ENDOSCOPY     Family History:  Family History  Problem Relation Age of Onset  . Hyperlipidemia Mother   . Hypertension Mother   . Breast cancer Mother  questionable  . Heart disease Mother   . Hypertension Father   . Irritable bowel syndrome Father   . Drug abuse Brother   . Heart disease Brother   .  Breast cancer Maternal Aunt        maternal great aunt  . Alcohol abuse Other   . Heart disease Maternal Uncle   . Irritable bowel syndrome Paternal Aunt   . Colon cancer Neg Hx    Family Psychiatric  History: see H&P Social History:  Social History   Substance and Sexual Activity  Alcohol Use Yes   Comment: pt is an alcoholic currently in [employer] meetings     Social History   Substance and Sexual Activity  Drug Use No    Social History   Socioeconomic History  . Marital status: Single    Spouse name: Not on file  . Number of children: 0  . Years of education: Not on file  . Highest education level: Not on file  Occupational History  . Occupation: disability  Social Needs  . Financial resource strain: Not on file  . Food insecurity:    Worry: Not on file    Inability: Not on file  . Transportation needs:    Medical: Not on file    Non-medical: Not on file  Tobacco Use  . Smoking status: Former Smoker    Packs/day: 0.10    Years: 10.00    Pack years: 1.00    Types: Cigarettes    Start date: 03/03/2014    Last attempt to quit: 04/03/2014    Years since quitting: 3.5  . Smokeless tobacco: Never Used  . Tobacco comment: Chews nicorette gum.  Substance and Sexual Activity  . Alcohol use: Yes    Comment: pt is an alcoholic currently in [employer]  . Drug use: No  . Sexual activity: Yes    Partners: Male    Birth control/protection: Condom    Comment: pt. declined condoms  Lifestyle  . Physical activity:    Days per week: Not on file    Minutes per session: Not on file  . Stress: Not on file  Relationships  . Social connections:    Talks on phone: Not on file    Gets together: Not on file    Attends religious service: Not on file    Active member of club or organization: Not on file    Attends meetings of clubs or organizations: Not on file    Relationship status: Not on file  Other Topics Concern  . Not on file  Social History Narrative    Single, disabled hair stylist   Lives with parents in a home with a basement.  Parents do not like for him to go down stairs because they are steep.           Additional Social History:                         Sleep: Good  Appetite:  Good  Current Medications: Current Facility-Administered Medications  Medication Dose Route Frequency Provider Last Rate Last Dose  . aMILoride (MIDAMOR) tablet 15 mg  15 mg Oral Daily Rankin, Shuvon B, NP   15 mg at 10/18/17 6578  . benzocaine (ORAJEL) 10 % mucosal gel   Mouth/Throat BID PRN Lindon Romp A, NP      . diclofenac sodium (VOLTAREN) 1 % transdermal gel 2 g  2 g Topical QID Sharma Covert,  MD   2 g at 10/18/17 1210  . dolutegravir (TIVICAY) tablet 50 mg  50 mg Oral Daily Rankin, Shuvon B, NP   50 mg at 10/18/17 7893  . emtricitabine-tenofovir AF (DESCOVY) 200-25 MG per tablet 1 tablet  1 tablet Oral Daily Rankin, Shuvon B, NP   1 tablet at 10/18/17 8101  . escitalopram (LEXAPRO) tablet 5 mg  5 mg Oral Daily Pennelope Bracken, MD   5 mg at 10/18/17 7510  . furosemide (LASIX) tablet 40 mg  40 mg Oral Daily Rankin, Shuvon B, NP   40 mg at 10/18/17 2585  . hydrOXYzine (ATARAX/VISTARIL) tablet 50 mg  50 mg Oral Q6H PRN Pennelope Bracken, MD   50 mg at 10/17/17 2132  . ibuprofen (ADVIL,MOTRIN) tablet 400 mg  400 mg Oral Q6H PRN Sharma Covert, MD   400 mg at 10/16/17 2135  . lactulose (CHRONULAC) 10 GM/15ML solution 20 g  20 g Oral TID Sharma Covert, MD   20 g at 10/18/17 1209  . lidocaine (LIDODERM) 5 % 1 patch  1 patch Transdermal Q24H Derrill Center, NP   1 patch at 10/18/17 1339  . nicotine polacrilex (NICORETTE) gum 2 mg  2 mg Oral PRN Sharma Covert, MD   2 mg at 10/18/17 1342  . pantoprazole (PROTONIX) EC tablet 40 mg  40 mg Oral Daily Rankin, Shuvon B, NP   40 mg at 10/18/17 2778  . potassium chloride SA (K-DUR,KLOR-CON) CR tablet 40 mEq  40 mEq Oral BID Rankin, Shuvon B, NP   40 mEq at 10/18/17 2423   . QUEtiapine (SEROQUEL) tablet 25 mg  25 mg Oral Q8H PRN Sharma Covert, MD      . QUEtiapine (SEROQUEL) tablet 50 mg  50 mg Oral QHS Sharma Covert, MD   50 mg at 10/17/17 2131  . rifaximin (XIFAXAN) tablet 550 mg  550 mg Oral BID Rankin, Shuvon B, NP   550 mg at 10/18/17 0821  . simethicone (MYLICON) chewable tablet 80 mg  80 mg Oral TID PRN Rankin, Shuvon B, NP      . thiamine (VITAMIN B-1) tablet 100 mg  100 mg Oral Daily Pennelope Bracken, MD   100 mg at 10/18/17 5361  . traZODone (DESYREL) tablet 100 mg  100 mg Oral QHS,MR X 1 Laverle Hobby, PA-C   100 mg at 10/17/17 2211    Lab Results:  Results for orders placed or performed during the hospital encounter of 10/05/17 (from the past 48 hour(s))  Ammonia     Status: Abnormal   Collection Time: 10/16/17  6:50 PM  Result Value Ref Range   Ammonia 83 (H) 9 - 35 umol/L    Comment: Performed at [facility], [place] [address] [place],  [address]    Blood Alcohol level:  Lab Results  Component Value Date   ETH <10 10/02/2017   ETH <5 40/01/6760    Metabolic Disorder Labs: No results found for: HGBA1C, MPG No results found for: PROLACTIN Lab Results  Component Value Date   CHOL 152 08/22/2017   TRIG 68 08/22/2017   HDL 56 08/22/2017   CHOLHDL 2.7 08/22/2017   VLDL 23 08/10/2016   LDLCALC 82 08/22/2017   LDLCALC 57 08/10/2016    Physical Findings: AIMS: Facial and Oral Movements Muscles of Facial Expression: None, normal Lips and Perioral Area: None, normal Jaw: None, normal Tongue: None, normal,Extremity Movements Upper (arms, wrists, hands, fingers): None,  normal Lower (legs, knees, ankles, toes): None, normal, Trunk Movements Neck, shoulders, hips: None, normal, Overall Severity Severity of abnormal movements (highest score from questions above): None, normal Incapacitation due to abnormal movements: None, normal Patient's awareness of abnormal movements (rate only patient's  report): No Awareness, Dental Status Current problems with teeth and/or dentures?: No Does patient usually wear dentures?: No  CIWA:    COWS:     Musculoskeletal: Strength & Muscle Tone: within normal limits Gait & Station: normal Patient leans: N/A  Psychiatric Specialty Exam: Physical Exam  Nursing note and vitals reviewed.   Review of Systems  Constitutional: Negative for chills and fever.  Respiratory: Negative for cough and shortness of breath.   Cardiovascular: Negative for chest pain.  Gastrointestinal: Negative for abdominal pain, heartburn, nausea and vomiting.  Psychiatric/Behavioral: Positive for depression and memory loss. Negative for hallucinations and suicidal ideas. The patient is nervous/anxious. The patient does not have insomnia.     Blood pressure (!) 128/58, pulse 74, temperature 98.5 F (36.9 C), temperature source Oral, resp. rate 18, height _0  (1.803 m), weight (!) 147.9 kg (326 lb).Body mass index is 45.47 kg/m.  General Appearance: Casual and Fairly Groomed  Eye Contact:  Good  Speech:  Clear and Coherent and Normal Rate  Volume:  Normal  Mood:  Anxious and Depressed  Affect:  Appropriate, Congruent and Constricted  Thought Process:  Coherent and Goal Directed  Orientation:  Full (Time, Place, and Person)  Thought Content:  Logical  Suicidal Thoughts:  No  Homicidal Thoughts:  No  Memory:  Immediate;   Fair Recent;   Fair Remote;   Fair  Judgement:  Fair  Insight:  Fair  Psychomotor Activity:  Normal  Concentration:  Concentration: Fair  Recall:  [employer] of Knowledge:  Fair  Language:  Fair  Akathisia:  No  Handed:    AIMS (if indicated):     Assets:  Communication Skills Desire for Improvement Housing Resilience Social Support  ADL's:  Intact  Cognition:  WNL  Sleep:  Number of Hours: 6.75    Treatment Plan Summary: Daily contact with patient to assess and evaluate symptoms and progress in treatment and Medication  management   -Continue inpatient hospitalization.  -MDD, recurrent, severe, without psychosis - Continue lexapro 70m po qDay - Continue seroquel 58mpo qhs  -Wernicke-Korsakoff Syndrome             - Continue thiamine 10078mo qDay             - SW investigating placement in memory care unit vs home health options due to severe cognitive impairment  -Anxiety -Continue vistaril 40m29m q6h prn anxiety  -Agitation -Continue seroquel 25mg65mq8h prn agitation  -Insomnia -Continue trazodone 40mg 6mhs prn insomnia  -HIV -Continue tivicay 40mg p46may -Continue descovy 200-25mg pe43mblet, take 1 tablet daily  -Elevated ammonia -Continue lactulose 20g TID             -Reviewed result of the ammonia level done on AM of 10/16/17, at 83, trending up from the last level of 10-13-17 at 44.  -Hepatic encephalopathy -Continue rifaximin 540mg po 87m -HTN -Continue amiloride 15mg po q64m- Continue lasix 40mg po qD19m-hypokalemia -Continue Potassium chloride 40 mEq po BID             -GERD -Continue protonix 40mg po qDa32mAcute pain -Continue voltaren gel 1%, 2g apply topically to affected area QID  -  Encourage participation in groups and therapeutic milieu  -Disposition planning will be ongoing  Pennelope Bracken, MD 10/18/2017, 3:22 PM

## 2017-10-19 LAB — COMPREHENSIVE METABOLIC PANEL
ALT: 18 U/L (ref 17–63)
ANION GAP: 9 (ref 5–15)
AST: 23 U/L (ref 15–41)
Albumin: 3.7 g/dL (ref 3.5–5.0)
Alkaline Phosphatase: 90 U/L (ref 38–126)
BUN: 14 mg/dL (ref 6–20)
CO2: 23 mmol/L (ref 22–32)
Calcium: 9.3 mg/dL (ref 8.9–10.3)
Chloride: 112 mmol/L — ABNORMAL HIGH (ref 101–111)
Creatinine, Ser: 0.97 mg/dL (ref 0.61–1.24)
GFR calc Af Amer: >60 mL/min (ref >60)
GFR calc non Af Amer: >60 mL/min (ref >60)
GLUCOSE: 86 mg/dL (ref 65–99)
POTASSIUM: 4.1 mmol/L (ref 3.5–5.1)
Sodium: 144 mmol/L (ref 135–145)
Total Bilirubin: 1.4 mg/dL — ABNORMAL HIGH (ref 0.3–1.2)
Total Protein: 6.3 g/dL — ABNORMAL LOW (ref 6.5–8.1)

## 2017-10-19 LAB — AMMONIA: AMMONIA: 69 umol/L — AB (ref 9–35)

## 2017-10-19 NOTE — Progress Notes (Signed)
The patient stated that he remained awake today since he had difficulty sleeping last evening. In addition, he stated that he felt laid back. His goal for tomorrow is to inquire about his discharge plans.

## 2017-10-19 NOTE — Progress Notes (Signed)
[facility] MD Progress Note  10/19/2017 2:46 PM [name]  MRN:  [id number] Subjective:   [name] is a 52 y/o M with history of MDD, alcohol use disorder, HIV, hepatic cirrhosis, and recently elevated ammonia with hepatic encephalopathy who was admitted after intentional overdose of xanax and alcohol in context of increased depression related to death of his mother unexpectedly on 3/31. Pt was found to have elevated ammonia, and it remains elevated during this admission despite trending down with use of lactulose. He was transferred to [facility] for additional treatment and stabilization of his psychiatric symptoms. He was continued on home medication of amitriptyline and monitored on the inpatient unit. Pt has been reporting improvement of his mood symptoms; however, he has episodic confusion and disorientation. Yesterday evening he disrobed and was confused, but he was able to be redirected. Due to his behaviors he was transferred to the 500 hall.He was changed from amitriptyline to lexapro due reduced sedation and he was discontinued from ativan. Pt has been reporting improvement of his mood symptoms, but he remains confused and disoriented on the unit at times; however, he is able to be redirected. He had MOCA administered with score of 6 out of 30 on 10/12/17. Pt had some improvement of his mood symptoms, confusion, and behavioral symptoms, but he remains disoriented at times while on the unit.   Today upon evaluation, pt shares, "I'm good. I just had trouble getting to sleep last night; I'm not used to going to bed at 10PM." He denies other specific concerns today. He reports the that his mood is "better." He denies SI/HI/AH/VH. In regards to anxiety, he shares, "I still have some but it's been than it has been." Pt reports that his medications have been helpful and he is tolerating them well. Pt denies current difficulties with his memory, but he acknowledges that he had some confusion during the earlier  part of his stay. He is oriented to location and month today, but he states that the date is "the 11th or 13th." Discussed with patient that we are coordinating his discharge plan with his family, and he verbalized good understanding. He had no further questions, comments, or concerns.  Principal Problem: Wernicke-Korsakoff syndrome Diagnosis:   Patient Active Problem List   Diagnosis Date Noted  . 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 Alcohol use disorder, severe, dependence ([place]) [F10.20] 10/11/2017  . MDD (major depressive disorder), recurrent severe, without psychosis ([place]) [F33.2]   . Alcoholism ([place]) [F10.20] 10/03/2017  . Obesity, Class III, BMI 40-49.9 (morbid obesity) ([place]) [E66.01] 10/03/2017  . Hepatic encephalopathy ([place]) [K72.90] 10/02/2017  . Suicidal ideation [R45.851]   . Secondary esophageal varices with bleeding (HCC) [I85.11]   . Venous stasis [I87.8] 03/02/2016  . Secondary esophageal varices without bleeding (HCC) [I85.10]   . Erectile dysfunction [N52.9] 07/14/2015  . Encephalopathy, hepatic ([place]) [K72.90] 01/23/2015  . Peripheral edema [R60.9] 12/31/2014  . Constipation [K59.00] 07/15/2014  . Fall [W19.XXXA] 01/07/2014  . Orthostatic hypotension [I95.1] 05/28/2013  . Personal history of failed moderate sedation- MUST HAVE MAC OR GENERAL [Z92.83] 05/01/2013  . Portal hypertensive gastropathy ([place]) [K76.6, K31.89] 01/02/2013  . Acquired pancytopenia ([place]) [K99.833] 05/19/2011  . Insomnia [G47.00] 12/21/2010  . Alcoholic cirrhosis of liver ([place]) [K70.30] 07/15/2010  . Idiopathic peripheral autonomic neuropathy [G90.09] 05/25/2007  . Idiopathic peripheral autonomic neuropathy, unspecified [G90.09] 05/25/2007  . SINUSITIS, CHRONIC MAXILLARY [J32.0] 03/06/2007  . Human immunodeficiency virus (HIV) disease ([place]) [B20] 06/23/2006  . HYPERLIPIDEMIA [E78.5] 06/23/2006  .  Anxiety state [F41.1] 06/23/2006  . Depression [F32.9] 06/23/2006  . HYPERTENSION [I10]  06/23/2006  . Reflux esophagitis [K21.0] 06/23/2006   Total Time spent with patient: 30 minutes  Past Psychiatric History: see H&P  Past Medical History:  Past Medical History:  Diagnosis Date  . Alcoholic cirrhosis of liver ([place])   . Alcoholism, chronic ([place])   . ANXIETY 06/23/2006  . Ascites 11/13/2009  . Ascites   . Avulsion fracture of middle phalanges of  3rd/4th fingers left hand 01/07/2014  . Blood dyscrasia    HIV  . Cellulitis of right lower extremity 03/02/2016  . DEPRESSION 06/23/2006  . 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 Erectile dysfunction 07/14/2015  . 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 Gastric ulcer 06/2010  . GERD (gastroesophageal reflux disease)   . HIV DISEASE 06/23/2006  . HYPERLIPIDEMIA 06/23/2006  . HYPERTENSION 06/23/2006   denies  . 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 Iron deficiency anemia   . Left foot infection   . Obesity (BMI 30-39.9)    BMI 34 kg/m^2  . Portal hypertensive gastropathy ([place]) 01/02/2013  . Recent shoulder injury    left shoulder/fell in parking lot/ no surgery/December 26, 2016  . SBP (spontaneous bacterial peritonitis) ([place]) 05/03/2011   Suspected by high leukocytes on paracentesis. Clinical scenario also compatible. November 2012 responded to Levaquin. Started on trimethoprim-sulfamethoxazole double strength daily for prophylaxis.   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 Skin cancer    left calf  . STRAIN, CHEST WALL 03/15/2007  . Varices, esophageal ([place]) 06/2010  . Venous stasis 03/02/2016    Past Surgical History:  Procedure Laterality Date  . CARPAL TUNNEL RELEASE     left  . COLONOSCOPY  march 2013  . ESOPHAGEAL BANDING  05/01/2013   Procedure: ESOPHAGEAL BANDING;  Surgeon: Gatha Mayer, MD;  Location: [facility];  Service: Endoscopy;;  . ESOPHAGOGASTRODUODENOSCOPY  07/01/2010;  08/12/10   small varices, gastric ulcer  . ESOPHAGOGASTRODUODENOSCOPY  10/26/2011   Procedure:  ESOPHAGOGASTRODUODENOSCOPY (EGD);  Surgeon: Gatha Mayer, MD;  Location: [facility];  Service: Endoscopy;  Laterality: N/A;  . ESOPHAGOGASTRODUODENOSCOPY N/A 01/02/2013   Procedure: ESOPHAGOGASTRODUODENOSCOPY (EGD);  Surgeon: Gatha Mayer, MD;  Location: [facility];  Service: Endoscopy;  Laterality: N/A;  . ESOPHAGOGASTRODUODENOSCOPY N/A 04/23/2013   Procedure: ESOPHAGOGASTRODUODENOSCOPY (EGD);  Surgeon: Gatha Mayer, MD;  Location: [facility];  Service: Endoscopy;  Laterality: N/A;  . ESOPHAGOGASTRODUODENOSCOPY N/A 05/01/2013   Procedure: ESOPHAGOGASTRODUODENOSCOPY (EGD);  Surgeon: Gatha Mayer, MD;  Location: [facility];  Service: Endoscopy;  Laterality: N/A;  follow-up varices and possibly band them  . ESOPHAGOGASTRODUODENOSCOPY N/A 09/04/2013   Procedure: ESOPHAGOGASTRODUODENOSCOPY (EGD);  Surgeon: Gatha Mayer, MD;  Location: [facility];  Service: Endoscopy;  Laterality: N/A;  . ESOPHAGOGASTRODUODENOSCOPY N/A 09/10/2014   Procedure: ESOPHAGOGASTRODUODENOSCOPY (EGD);  Surgeon: Gatha Mayer, MD;  Location: [facility];  Service: Endoscopy;  Laterality: N/A;  . ESOPHAGOGASTRODUODENOSCOPY (EGD) WITH PROPOFOL N/A 08/21/2015   Procedure: ESOPHAGOGASTRODUODENOSCOPY (EGD) WITH PROPOFOL;  Surgeon: Gatha Mayer, MD;  Location: [facility];  Service: Endoscopy;  Laterality: N/A;  . ESOPHAGOGASTRODUODENOSCOPY (EGD) WITH PROPOFOL N/A 04/06/2017   Procedure: ESOPHAGOGASTRODUODENOSCOPY (EGD) WITH PROPOFOL;  Surgeon: Gatha Mayer, MD;  Location: [facility];  Service: Endoscopy;  Laterality: N/A;  . ESOPHAGOGASTRODUODENOSCOPY W/ BANDING  06/26/2010   variceal ligation  . GASTRIC VARICES BANDING N/A 01/02/2013   Procedure: GASTRIC VARICES BANDING;  Surgeon: Gatha Mayer, MD;  Location: [facility];  Service: Endoscopy;  Laterality:  N/A;  possible banding  . UPPER GASTROINTESTINAL ENDOSCOPY     Family History:  Family History  Problem Relation Age of Onset  . Hyperlipidemia  Mother   . Hypertension Mother   . Breast cancer Mother        questionable  . Heart disease Mother   . Hypertension Father   . Irritable bowel syndrome Father   . Drug abuse Brother   . Heart disease Brother   . Breast cancer Maternal Aunt        maternal great aunt  . Alcohol abuse Other   . Heart disease Maternal Uncle   . Irritable bowel syndrome Paternal Aunt   . Colon cancer Neg Hx    Family Psychiatric  History: see H&P Social History:  Social History   Substance and Sexual Activity  Alcohol Use Yes   Comment: pt is an alcoholic currently in [place] meetings     Social History   Substance and Sexual Activity  Drug Use No    Social History   Socioeconomic History  . Marital status: Single    Spouse name: Not on file  . Number of children: 0  . Years of education: Not on file  . Highest education level: Not on file  Occupational History  . Occupation: disability  Social Needs  . Financial resource strain: Not on file  . Food insecurity:    Worry: Not on file    Inability: Not on file  . Transportation needs:    Medical: Not on file    Non-medical: Not on file  Tobacco Use  . Smoking status: Former Smoker    Packs/day: 0.10    Years: 10.00    Pack years: 1.00    Types: Cigarettes    Start date: 03/03/2014    Last attempt to quit: 04/03/2014    Years since quitting: 3.5  . Smokeless tobacco: Never Used  . Tobacco comment: Chews nicorette gum.  Substance and Sexual Activity  . Alcohol use: Yes    Comment: pt is an alcoholic currently in [employer]  . Drug use: No  . Sexual activity: Yes    Partners: Male    Birth control/protection: Condom    Comment: pt. declined condoms  Lifestyle  . Physical activity:    Days per week: Not on file    Minutes per session: Not on file  . Stress: Not on file  Relationships  . Social connections:    Talks on phone: Not on file    Gets together: Not on file    Attends religious service: Not on  file    Active member of club or organization: Not on file    Attends meetings of clubs or organizations: Not on file    Relationship status: Not on file  Other Topics Concern  . Not on file  Social History Narrative   Single, disabled hair stylist   Lives with parents in a home with a basement.  Parents do not like for him to go down stairs because they are steep.           Additional Social History:                         Sleep: Fair  Appetite:  Good  Current Medications: Current Facility-Administered Medications  Medication Dose Route Frequency Provider Last Rate Last Dose  . aMILoride (MIDAMOR) tablet 15 mg  15 mg Oral Daily Rankin, Shuvon B, NP  15 mg at 10/19/17 0802  . benzocaine (ORAJEL) 10 % mucosal gel   Mouth/Throat BID PRN Lindon Romp A, NP      . diclofenac sodium (VOLTAREN) 1 % transdermal gel 2 g  2 g Topical QID Sharma Covert, MD   2 g at 10/19/17 1158  . dolutegravir (TIVICAY) tablet 50 mg  50 mg Oral Daily Rankin, Shuvon B, NP   50 mg at 10/19/17 0802  . emtricitabine-tenofovir AF (DESCOVY) 200-25 MG per tablet 1 tablet  1 tablet Oral Daily Rankin, Shuvon B, NP   1 tablet at 10/19/17 0802  . escitalopram (LEXAPRO) tablet 5 mg  5 mg Oral Daily Pennelope Bracken, MD   5 mg at 10/19/17 0802  . furosemide (LASIX) tablet 40 mg  40 mg Oral Daily Rankin, Shuvon B, NP   40 mg at 10/19/17 0802  . hydrOXYzine (ATARAX/VISTARIL) tablet 50 mg  50 mg Oral Q6H PRN Pennelope Bracken, MD   50 mg at 10/18/17 2206  . ibuprofen (ADVIL,MOTRIN) tablet 400 mg  400 mg Oral Q6H PRN Sharma Covert, MD   400 mg at 10/19/17 0912  . lactulose (CHRONULAC) 10 GM/15ML solution 20 g  20 g Oral TID Sharma Covert, MD   20 g at 10/19/17 1205  . lidocaine (LIDODERM) 5 % 1 patch  1 patch Transdermal Q24H Derrill Center, NP   1 patch at 10/19/17 1059  . nicotine polacrilex (NICORETTE) gum 2 mg  2 mg Oral PRN Sharma Covert, MD   2 mg at 10/19/17 1201  .  pantoprazole (PROTONIX) EC tablet 40 mg  40 mg Oral Daily Rankin, Shuvon B, NP   40 mg at 10/19/17 0802  . potassium chloride SA (K-DUR,KLOR-CON) CR tablet 40 mEq  40 mEq Oral BID Rankin, Shuvon B, NP   40 mEq at 10/19/17 0801  . QUEtiapine (SEROQUEL) tablet 25 mg  25 mg Oral Q8H PRN Sharma Covert, MD      . QUEtiapine (SEROQUEL) tablet 50 mg  50 mg Oral QHS Sharma Covert, MD   50 mg at 10/18/17 2206  . rifaximin (XIFAXAN) tablet 550 mg  550 mg Oral BID Rankin, Shuvon B, NP   550 mg at 10/19/17 0802  . simethicone (MYLICON) chewable tablet 80 mg  80 mg Oral TID PRN Rankin, Shuvon B, NP      . thiamine (VITAMIN B-1) tablet 100 mg  100 mg Oral Daily Pennelope Bracken, MD   100 mg at 10/19/17 0802  . traZODone (DESYREL) tablet 100 mg  100 mg Oral QHS,MR X 1 Laverle Hobby, PA-C   100 mg at 10/18/17 2209  . tuberculin injection 5 Units  5 Units Intradermal Once Pennelope Bracken, MD   5 Units at 10/18/17 1601    Lab Results:  Results for orders placed or performed during the hospital encounter of 10/05/17 (from the past 48 hour(s))  Comprehensive metabolic panel     Status: Abnormal   Collection Time: 10/19/17  6:49 AM  Result Value Ref Range   Sodium 144 135 - 145 mmol/L   Potassium 4.1 3.5 - 5.1 mmol/L   Chloride 112 (H) 101 - 111 mmol/L   CO2 23 22 - 32 mmol/L   Glucose, Bld 86 65 - 99 mg/dL   BUN 14 6 - 20 mg/dL   Creatinine, Ser 0.97 0.61 - 1.24 mg/dL   Calcium 9.3 8.9 - 10.3 mg/dL   Total Protein 6.3 (L)  6.5 - 8.1 g/dL   Albumin 3.7 3.5 - 5.0 g/dL   AST 23 15 - 41 U/L   ALT 18 17 - 63 U/L   Alkaline Phosphatase 90 38 - 126 U/L   Total Bilirubin 1.4 (H) 0.3 - 1.2 mg/dL   GFR calc non Af Amer >60 >60 mL/min   GFR calc Af Amer >60 >60 mL/min    Comment: (NOTE) The eGFR has been calculated using the CKD EPI equation. This calculation has not been validated in all clinical situations. eGFR's persistently <60 mL/min signify possible Chronic Kidney Disease.     Anion gap 9 5 - 15    Comment: Performed at [facility], [place] [address] [place], [place] [address]  Ammonia     Status: Abnormal   Collection Time: 10/19/17  6:49 AM  Result Value Ref Range   Ammonia 69 (H) 9 - 35 umol/L    Comment: Performed at [facility], [place] [address] [place], [place] [address]    Blood Alcohol level:  Lab Results  Component Value Date   ETH <10 10/02/2017   ETH <5 97/35/3299    Metabolic Disorder Labs: No results found for: HGBA1C, MPG No results found for: PROLACTIN Lab Results  Component Value Date   CHOL 152 08/22/2017   TRIG 68 08/22/2017   HDL 56 08/22/2017   CHOLHDL 2.7 08/22/2017   VLDL 23 08/10/2016   LDLCALC 82 08/22/2017   LDLCALC 57 08/10/2016    Physical Findings: AIMS: Facial and Oral Movements Muscles of Facial Expression: None, normal Lips and Perioral Area: None, normal Jaw: None, normal Tongue: None, normal,Extremity Movements Upper (arms, wrists, hands, fingers): None, normal Lower (legs, knees, ankles, toes): None, normal, Trunk Movements Neck, shoulders, hips: None, normal, Overall Severity Severity of abnormal movements (highest score from questions above): None, normal Incapacitation due to abnormal movements: None, normal Patient's awareness of abnormal movements (rate only patient's report): No Awareness, Dental Status Current problems with teeth and/or dentures?: No Does patient usually wear dentures?: No  CIWA:    COWS:     Musculoskeletal: Strength & Muscle Tone: within normal limits Gait & Station: normal Patient leans: N/A  Psychiatric Specialty Exam: Physical Exam  Nursing note and vitals reviewed.   Review of Systems  Constitutional: Negative for chills and fever.  Respiratory: Negative for cough and shortness of breath.   Cardiovascular: Negative for chest pain.  Gastrointestinal: Negative for abdominal pain, heartburn, nausea and vomiting.   Psychiatric/Behavioral: Negative for depression, hallucinations and suicidal ideas. The patient is nervous/anxious and has insomnia.     Blood pressure (!) 129/103, pulse (!) 123, temperature 98.1 F (36.7 C), temperature source Oral, resp. rate 20, height 5' 11"  (1.803 m), weight (!) 147.9 kg (326 lb).Body mass index is 45.47 kg/m.  General Appearance: Casual and Fairly Groomed  Eye Contact:  Good  Speech:  Clear and Coherent and Normal Rate  Volume:  Normal  Mood:  Anxious and Depressed  Affect:  Appropriate, Congruent, Constricted, Depressed and Flat  Thought Process:  Coherent and Goal Directed  Orientation:  Other:  oriented to location and month only  Thought Content:  Logical  Suicidal Thoughts:  No  Homicidal Thoughts:  No  Memory:  Immediate;   Fair Recent;   Fair Remote;   Fair  Judgement:  Fair  Insight:  Lacking  Psychomotor Activity:  Normal  Concentration:  Concentration: Fair  Recall:  [employer] of Knowledge:  Fair  Language:  Fair  Akathisia:  No  Handed:    AIMS (if indicated):     Assets:  Communication Skills Resilience Social Support  ADL's:  Intact  Cognition:  WNL  Sleep:  Number of Hours: 5   Treatment Plan Summary: Daily contact with patient to assess and evaluate symptoms and progress in treatment and Medication management   -Continue inpatient hospitalization.  -MDD, recurrent, severe, without psychosis - Continue lexapro 70m po qDay - Continue seroquel 534mpo qhs  -Wernicke-Korsakoff Syndrome - Continue thiamine 10064mo qDay - SW investigating placement in memory care unit vs home health options due to severe cognitive impairment  -Anxiety -Continue vistaril 62m91m q6h prn anxiety  -Agitation -Continue seroquel 25mg65mq8h prn agitation  -Insomnia -Continue trazodone 62mg 43mhs prn insomnia  -HIV -Continue tivicay 62mg p81mDay -Continue descovy 200-25mg pe81mblet, take 1 tablet daily  -Elevated ammonia -Continue lactulose 20g TID -Reviewed result of the ammonia level done on AM of 10/16/17, at 83, trending up from the last level of 10-13-17 at 44.  -Hepatic encephalopathy -Continue rifaximin 562mg po 42m -HTN -Continue amiloride 15mg po q9m- Continue lasix 40mg po qD24m-hypokalemia -Continue Potassium chloride 40 mEq po BID  -GERD -Continue protonix 40mg po qDa59mAcute pain -Continue voltaren gel 1%, 2g apply topically to affected area QID  -Encourage participation in groups and therapeutic milieu  -Disposition planning will be ongoing  Anniyah Mood Pennelope Bracken9, 2:46 PM

## 2017-10-19 NOTE — Progress Notes (Signed)
Recreation Therapy Notes  Date: 5.8.19 Time: 1000 Location: 500 Hall Dayroom  Group Topic: Self-Esteem  Goal Area(s) Addresses:  Patient will successfully identify positive attributes about themselves.  Patient will successfully identify benefit of improved self-esteem.   Behavioral Response: Engaged  Intervention: Markers, blank crest  Activity: [place].  Patients were to identify things that mean something to them.  Patients could highlight their biggest accomplishments, proudest moment, best feature, something they are good at or something they value.  Education:  Self-Esteem, Dentist.   Education Outcome: Acknowledges education/In group clarification offered/Needs additional education  Clinical Observations/Feedback: Pt focused on his dog "Symone" because she is a good listener; his mother who recently passed because she was "really sweet" and his nephews [place] and [place].  Pt stated he used to be their hero but now "they are my heroes".  Pt was pleasant and able to focus on activity.  Pt was appropriate and bright.    [name], LRT/CTRS     Ria Comment, Branch Pacitti A 10/19/2017 11:25 AM

## 2017-10-19 NOTE — Progress Notes (Signed)
Nursing Progress Note: 7p-7a D: Pt currently presents with a pleasant affect and behavior. Pt states "I am ready to go home tomorrow. I feel so much better." Interacting appropriately with the milieu. Pt reports off and on sleep during the previous night with current medication regimen. Pt did attend wrap-up group.  A: Pt provided with medications per providers orders. Pt's labs and vitals were monitored throughout the night. Pt supported emotionally and encouraged to express concerns and questions. Pt educated on medications.  R: Pt's safety ensured with 15 minute and environmental checks. Pt currently denies SI, HI, and AVH. Pt verbally contracts to seek staff if SI,HI, or AVH occurs and to consult with staff before acting on any harmful thoughts. Will continue to monitor.

## 2017-10-19 NOTE — Tx Team (Signed)
Interdisciplinary Treatment and Diagnostic Plan Update  10/19/2017 Time of Session: [place] MRN: [id number]  Principal Diagnosis: Korsakoff syndrome  Secondary Diagnoses: Principal Problem:   Korsakoff syndrome Active Problems:   Encephalopathy, hepatic (HCC)   MDD (major depressive disorder), recurrent severe, without psychosis ([place])   Alcohol use disorder, severe, dependence ([place])   Current Medications:  Current Facility-Administered Medications  Medication Dose Route Frequency Provider Last Rate Last Dose  . aMILoride (MIDAMOR) tablet 15 mg  15 mg Oral Daily Rankin, Shuvon B, NP   15 mg at 10/19/17 0802  . benzocaine (ORAJEL) 10 % mucosal gel   Mouth/Throat BID PRN Lindon Romp A, NP      . diclofenac sodium (VOLTAREN) 1 % transdermal gel 2 g  2 g Topical QID Sharma Covert, MD   2 g at 10/19/17 0801  . dolutegravir (TIVICAY) tablet 50 mg  50 mg Oral Daily Rankin, Shuvon B, NP   50 mg at 10/19/17 0802  . emtricitabine-tenofovir AF (DESCOVY) 200-25 MG per tablet 1 tablet  1 tablet Oral Daily Rankin, Shuvon B, NP   1 tablet at 10/19/17 0802  . escitalopram (LEXAPRO) tablet 5 mg  5 mg Oral Daily Pennelope Bracken, MD   5 mg at 10/19/17 0802  . furosemide (LASIX) tablet 40 mg  40 mg Oral Daily Rankin, Shuvon B, NP   40 mg at 10/19/17 0802  . hydrOXYzine (ATARAX/VISTARIL) tablet 50 mg  50 mg Oral Q6H PRN Pennelope Bracken, MD   50 mg at 10/18/17 2206  . ibuprofen (ADVIL,MOTRIN) tablet 400 mg  400 mg Oral Q6H PRN Sharma Covert, MD   400 mg at 10/16/17 2135  . lactulose (CHRONULAC) 10 GM/15ML solution 20 g  20 g Oral TID Sharma Covert, MD   20 g at 10/19/17 0801  . lidocaine (LIDODERM) 5 % 1 patch  1 patch Transdermal Q24H Derrill Center, NP   1 patch at 10/18/17 1339  . nicotine polacrilex (NICORETTE) gum 2 mg  2 mg Oral PRN Sharma Covert, MD   2 mg at 10/19/17 0806  . pantoprazole (PROTONIX) EC tablet 40 mg  40 mg Oral Daily Rankin, Shuvon B,  NP   40 mg at 10/19/17 0802  . potassium chloride SA (K-DUR,KLOR-CON) CR tablet 40 mEq  40 mEq Oral BID Rankin, Shuvon B, NP   40 mEq at 10/19/17 0801  . QUEtiapine (SEROQUEL) tablet 25 mg  25 mg Oral Q8H PRN Sharma Covert, MD      . QUEtiapine (SEROQUEL) tablet 50 mg  50 mg Oral QHS Sharma Covert, MD   50 mg at 10/18/17 2206  . rifaximin (XIFAXAN) tablet 550 mg  550 mg Oral BID Rankin, Shuvon B, NP   550 mg at 10/19/17 0802  . simethicone (MYLICON) chewable tablet 80 mg  80 mg Oral TID PRN Rankin, Shuvon B, NP      . thiamine (VITAMIN B-1) tablet 100 mg  100 mg Oral Daily Pennelope Bracken, MD   100 mg at 10/19/17 0802  . traZODone (DESYREL) tablet 100 mg  100 mg Oral QHS,MR X 1 Laverle Hobby, PA-C   100 mg at 10/18/17 2209  . tuberculin injection 5 Units  5 Units Intradermal Once Pennelope Bracken, MD   5 Units at 10/18/17 1601   PTA Medications: Medications Prior to Admission  Medication Sig Dispense Refill Last Dose  . AMBULATORY NON FORMULARY MEDICATION Lift Chair- Use as needed  Dx: generalized weakness, hepatic encephalopathy 1 each 0 Past Month at Unknown time  . aMILoride (MIDAMOR) 5 MG tablet TAKE 3 TABLETS(15 MG) BY MOUTH DAILY 90 tablet 11 10/01/2017 at Unknown time  . CONSTULOSE 10 GM/15ML solution TAKE 60 ML BY MOUTH 4 TIMES DAILY 3792 mL 5 10/01/2017 at Unknown time  . dolutegravir (TIVICAY) 50 MG tablet Take 1 tablet (50 mg total) by mouth daily. 30 tablet 11 10/01/2017 at noon  . emtricitabine-tenofovir AF (DESCOVY) 200-25 MG tablet Take 1 tablet by mouth daily. 30 tablet 11 10/01/2017 at noon  . esomeprazole (NEXIUM) 40 MG capsule Take 1 capsule (40 mg total) by mouth daily before breakfast. 30 capsule 0 Past Month at Unknown time  . furosemide (LASIX) 40 MG tablet TAKE 2 TABLETS(80 MG) BY MOUTH TWICE DAILY 120 tablet 11 10/01/2017 at Unknown time  . potassium chloride SA (K-DUR,KLOR-CON) 20 MEQ tablet Take 2 tablets (40 mEq total) by mouth 2 (two) times  daily. 120 tablet 11 10/01/2017 at Unknown time  . senna (SENOKOT) 8.6 MG TABS tablet TAKE 2 TABLETS(17.2 MG) BY MOUTH DAILY. START WITH 1 TABLET (Patient taking differently: 1 t po qd) 60 tablet 0 10/01/2017 at Unknown time  . Simethicone (GAS-X PO) Take 1-2 tablets by mouth 3 (three) times daily as needed (for flatulence).    Past Month at Unknown time  . triamcinolone cream (KENALOG) 0.1 % APPLY TO AFFECTED AREA DAILY AS NEEDED FOR ITCHING OR RASH  1 Past Month at Unknown time  . XIFAXAN 550 MG TABS tablet TAKE ONE TABLET BY MOUTH TWICE DAILY 60 tablet 11 10/01/2017 at Unknown time    Patient Stressors: Health problems Loss of Mother died  Patient Strengths: Capable of independent living [employer]ator fund of knowledge  Treatment Modalities: Medication Management, Group therapy, Case management,  1 to 1 session with clinician, Psychoeducation, Recreational therapy.   Physician Treatment Plan for Primary Diagnosis: Korsakoff syndrome Long Term Goal(s): Improvement in symptoms so as ready for discharge Improvement in symptoms so as ready for discharge   Short Term Goals: Ability to identify changes in lifestyle to reduce recurrence of condition will improve Ability to verbalize feelings will improve Ability to disclose and discuss suicidal ideas Ability to demonstrate self-control will improve Ability to identify and develop effective coping behaviors will improve Ability to maintain clinical measurements within normal limits will improve Compliance with prescribed medications will improve Ability to identify triggers associated with substance abuse/mental health issues will improve Ability to identify changes in lifestyle to reduce recurrence of condition will improve Ability to verbalize feelings will improve Ability to disclose and discuss suicidal ideas Ability to demonstrate self-control will improve Ability to identify and develop effective coping behaviors will  improve Ability to maintain clinical measurements within normal limits will improve Compliance with prescribed medications will improve Ability to identify triggers associated with substance abuse/mental health issues will improve  Medication Management: Evaluate patient's response, side effects, and tolerance of medication regimen.  Therapeutic Interventions: 1 to 1 sessions, Unit Group sessions and Medication administration.  Evaluation of Outcomes: Progressing   5/3:  MOCA was completed with patient and overall score was 6/30 with subcategory score breakdown as follows: Visuospatial/executive (1/5), naming (1/3), attention (1/6), language (0/3), abstraction (0/2), delayed recall (0/5), and orientation (3/6). Pt reported date as September 21st and day of week as Saturday, despite being corrected earlier in interview. He was able to correctly identify the year, his location in the hospital, and city as [place]. Primary  diagnosis is Korsakoff syndrome.   CSW to work on getting patient into memory care unit.  5/8:He endorses some anxiety, and he shares, "I was going to ask for some ativan, but I decided against it." Pt has improved insight about the impact of his drinking and abuse of benzodiazepines in contributing to his worsened mood symptoms and worsened memory problems. Pt has improved linear thinking and ability to track during the interview. He is more alert compared to previous interactions. He is oriented to date/year and location today. He is able to correctly name 3/3 items on 5 minute recall -Wernicke-Korsakoff Syndrome - Continue thiamine 100mg  po qDay - SW investigating placement in memory care unit vs home health options due to severe cognitive impairment  -Anxiety -Continue vistaril 50mg  po q6h prn anxiety  -Agitation -Continue seroquel 25mg  po q8h prn agitation  -Insomnia -Continue trazodone 50mg  po qhs prn  insomnia  -HIV -Continue tivicay 50mg  po qDay -Continue descovy 200-25mg  per tablet, take 1 tablet daily  -Elevated ammonia -Continue lactulose 20g TID -Reviewed result of the ammonia level done on AM of 10/16/17, at 83, trending up from the last level of 10-13-17 at 44.  -Hepatic encephalopathy -Continue rifaximin 550mg  po BID     Physician Treatment Plan for Secondary Diagnosis: Principal Problem:   Korsakoff syndrome Active Problems:   Encephalopathy, hepatic (HCC)   MDD (major depressive disorder), recurrent severe, without psychosis ([place])   Alcohol use disorder, severe, dependence ([place])  Long Term Goal(s): Improvement in symptoms so as ready for discharge Improvement in symptoms so as ready for discharge   Short Term Goals: Ability to identify changes in lifestyle to reduce recurrence of condition will improve Ability to verbalize feelings will improve Ability to disclose and discuss suicidal ideas Ability to demonstrate self-control will improve Ability to identify and develop effective coping behaviors will improve Ability to maintain clinical measurements within normal limits will improve Compliance with prescribed medications will improve Ability to identify triggers associated with substance abuse/mental health issues will improve Ability to identify changes in lifestyle to reduce recurrence of condition will improve Ability to verbalize feelings will improve Ability to disclose and discuss suicidal ideas Ability to demonstrate self-control will improve Ability to identify and develop effective coping behaviors will improve Ability to maintain clinical measurements within normal limits will improve Compliance with prescribed medications will improve Ability to identify triggers associated with substance abuse/mental health issues will improve     Medication Management: Evaluate patient's response, side  effects, and tolerance of medication regimen.  Therapeutic Interventions: 1 to 1 sessions, Unit Group sessions and Medication administration.  Evaluation of Outcomes: Progressing   RN Treatment Plan for Primary Diagnosis: Korsakoff syndrome Long Term Goal(s): Knowledge of disease and therapeutic regimen to maintain health will improve  Short Term Goals: Ability to identify and develop effective coping behaviors will improve and Compliance with prescribed medications will improve  Medication Management: RN will administer medications as ordered by provider, will assess and evaluate patient's response and provide education to patient for prescribed medication. RN will report any adverse and/or side effects to prescribing provider.  Therapeutic Interventions: 1 on 1 counseling sessions, Psychoeducation, Medication administration, Evaluate responses to treatment, Monitor vital signs and CBGs as ordered, Perform/monitor CIWA, COWS, AIMS and Fall Risk screenings as ordered, Perform wound care treatments as ordered.  Evaluation of Outcomes: Progressing   LCSW Treatment Plan for Primary Diagnosis: Korsakoff syndrome Long Term Goal(s): Safe transition to appropriate next level of care at discharge, Engage patient  in therapeutic group addressing interpersonal concerns.  Short Term Goals: Engage patient in aftercare planning with referrals and resources, Increase social support and Increase skills for wellness and recovery  Therapeutic Interventions: Assess for all discharge needs, 1 to 1 time with Social worker, Explore available resources and support systems, Assess for adequacy in community support network, Educate family and significant other(s) on suicide prevention, Complete Psychosocial Assessment, Interpersonal group therapy.  Evaluation of Outcomes: Progressing   Progress in Treatment: Attending groups: Yes Participating in groups:Yes Taking medication as prescribed: Yes. Toleration  medication: Yes. Family/Significant other contact made: No, will contact:  aunt Patient understands diagnosis: No Discussing patient identified problems/goals with staff: Yes. Medical problems stabilized or resolved: Yes. Denies suicidal/homicidal ideation: Yes. Issues/concerns per patient self-inventory: No. Other: none  New problem(s) identified: No, Describe:  none  New Short Term/Long Term Goal(s):Pt goal: "to get clean, detox from xanax"  Discharge Plan or Barriers:   Reason for Continuation of Hospitalization: Depression Medication stabilization Withdrawal symptoms  Estimated Length of Stay: Likely d/c tomorrow, Friday at the latest  Attendees: Patient: 10/19/2017   Physician: Maris Berger, MD 10/19/2017   Nursing: Phillis Haggis RN 10/19/2017   RN Care Manager: 10/19/2017   Social Worker: Roque Lias, [place] 10/19/2017   Recreational Therapist:  10/19/2017   Other:  10/19/2017   Other:  10/19/2017   Other: 10/19/2017     Scribe for Treatment Team: Trish Mage, LCSW 10/19/2017 9:03 AM

## 2017-10-19 NOTE — Plan of Care (Signed)
  Problem: Education: Goal: Mental status will improve Outcome: Progressing   Problem: Safety: Goal: Periods of time without injury will increase Outcome: Progressing  D: Pt visible in milieu majority of this shift. Denies SI, HI, AVH and pain. Presents animated and in good spirits "I'm alright, I just want to go home". Presents more logical during conversations, interacts well with staff and peers. Reports he did not sleep well last night "I'm just anxious about going home". Reports his appetite is good. Rates his depression 4/10, anxiety 5/10 and hopelessness 0/10. Pt's goal this shift "sleep tonight and prepare for the weekend".  A: Introduced self to pt. Emotional support and availability provided to pt.  Scheduled medications administered with verbal education and effects monitored. Pt encouraged to voice concerns, attend to ADLs and comply with current treatment plan including groups. Safety checks continues at Q 15 minutes intervals without outburst or self harm gestures to note at this time.   R: Pt receptive to care. Compliant with medications when offered. Denies adverse drug reactions when assessed. Attended all scheduled groups and was engaged. Went off unit for activities, returned without issues. Tolerates all PO intake well. POC maintained for safety and mood stability.

## 2017-10-20 NOTE — Progress Notes (Signed)
Patient denies SI, Hi and AVH this shift.  Patient has been calm and cooperative, attended groups and engaged in unit activities.  Patient has had no incidents of behavioral dyscontrol.   Assess patient for safety offer medications as prescribed, engaged patient in 1:1 staff talks.   Patient able to contract for safety. Continue to monitor as planned.

## 2017-10-20 NOTE — Progress Notes (Signed)
  [facility] Adult Case Management Discharge Plan :  Will you be returning to the same living situation after discharge:  Yes,  home At discharge, do you have transportation home?: Yes,  brother Do you have the ability to pay for your medications: Yes,  MCD  Release of information consent forms completed and in the chart;  Patient's signature needed at discharge.  Patient to Follow up at: [facility]. Go on 10/25/2017.   Why:  Tuesday at 9:45 for your hospital follow up appointment.  Bring your ID, insurance card and your hospital d/c paperwork. Also, I tried to refer you for CST services tih [facility] at [phone], but they will not be up and running until June 1.   Contact information: [place] [address] [phone]           Next level of care provider has access to [facility] and Suicide Prevention discussed: Yes,  yes  Have you used any form of tobacco in the last 30 days? (Cigarettes, Smokeless Tobacco, Cigars, and/or Pipes): No  Has patient been referred to the Quitline?: N/A patient is not a smoker  Patient has been referred for addiction treatment: [place], LCSW 10/20/2017, 4:20 PM

## 2017-10-20 NOTE — Progress Notes (Signed)
Adult Psychoeducational Group Note  Date:  10/20/2017 Time:  8:34 PM  Group Topic/Focus:  Wrap-Up Group:   The focus of this group is to help patients review their daily goal of treatment and discuss progress on daily workbooks.  Participation Level:  Active  Participation Quality:  Appropriate  Affect:  Appropriate  Cognitive:  Alert  Insight: Appropriate  Engagement in Group:  Engaged  Modes of Intervention:  Discussion  Additional Comments:  Patient stated it has been a laid back day. Patient's goal for today was to talk to the doctor about a discharge plan. Patient met goal and will be discharging tomorrow.   [name] 10/20/2017, 8:34 PM

## 2017-10-20 NOTE — Progress Notes (Signed)
[facility] MD Progress Note  10/20/2017 3:46 PM ALEPH NICKSON  MRN:  [id number]  Subjective: Nachum reports, "I'm doing well, feeling great. I'm ready to go home to care for my dad. He is aging & not in the best of health. My mother recently passed away on 11/08/2017. That is how I got very depressed. Her death hit me hard. She was 78, born in 29. I was broken, then I started to over-medicate with Xanax 1 mg twice daily. I could not stay awake. I realized now that my body cannot handle that much Xanax in a day. I feel wonderful".   Raekwan Spelman is a 52 y/o M with history of MDD, alcohol use disorder, HIV, hepatic cirrhosis, and recently elevated ammonia with hepatic encephalopathy who was admitted after intentional overdose of xanax and alcohol in context of increased depression related to death of his mother unexpectedly on 3/31. Pt was found to have elevated ammonia, and it remains elevated during this admission despite trending down with use of lactulose. He was transferred to [facility] for additional treatment and stabilization of his psychiatric symptoms. He was continued on home medication of amitriptyline and monitored on the inpatient unit. Pt has been reporting improvement of his mood symptoms; however, he has episodic confusion and disorientation. Yesterday evening he disrobed and was confused, but he was able to be redirected. Due to his behaviors he was transferred to the 500 hall.He was changed from amitriptyline to lexapro due reduced sedation and he was discontinued from ativan. Pt has been reporting improvement of his mood symptoms, but he remains confused and disoriented on the unit at times; however, he is able to be redirected. He had MOCA administered with score of 6 out of 30 on 10/12/17. Pt had some improvement of his mood symptoms, confusion, and behavioral symptoms, but he remains disoriented at times while on the unit.   Tiquan is seen, chart reviewed. The chart findings discussed with the  treatment team. He presents alert, oriented & more away of situation than when first came in to the hospital. He is remembering more. He says he got more depressed because of the recent death of his mother whom he considered his best friend. He says he took his mother's death hard, it broke him. Then, he resort to taking more Xanax 1 mg tablets bid & slept more & more . He is currently oriented to location, month & year. However, he says today is the 12th instead of the 9th. While indirectly tasting his short term memory & orientation, patient remembered the attending psychiatrist testing him as such the day prior. When asked if he did remember what the psychiatrist asked him to do? He says, "he named 3 things & asked me to repeat them after 5 minutes" which he did state those to be [employer], table & red fox). He did well on this provider's memory questions as well (Apple, Pen, book). His discharge plan is now ongoing. He had no further questions, comments, or concerns. He has agreed to continue current plan of care without any problems. His ammonia level is at 69, gradually threading down again.  Principal Problem: Wernicke-Korsakoff syndrome Diagnosis:   Patient Active Problem List   Diagnosis Date Noted  . 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 Alcohol use disorder, severe, dependence ([place]) [F10.20] 10/11/2017  . MDD (major depressive disorder), recurrent severe, without psychosis ([place]) [F33.2]   . Alcoholism ([place]) [F10.20] 10/03/2017  . Obesity, Class III, BMI 40-49.9 (morbid obesity) ([place]) [E66.01]  10/03/2017  . Hepatic encephalopathy ([place]) [K72.90] 10/02/2017  . Suicidal ideation [R45.851]   . Secondary esophageal varices with bleeding (HCC) [I85.11]   . Venous stasis [I87.8] 03/02/2016  . Secondary esophageal varices without bleeding (HCC) [I85.10]   . Erectile dysfunction [N52.9] 07/14/2015  . Encephalopathy, hepatic ([place]) [K72.90] 01/23/2015  . Peripheral edema [R60.9] 12/31/2014   . Constipation [K59.00] 07/15/2014  . Fall [W19.XXXA] 01/07/2014  . Orthostatic hypotension [I95.1] 05/28/2013  . Personal history of failed moderate sedation- MUST HAVE MAC OR GENERAL [Z92.83] 05/01/2013  . Portal hypertensive gastropathy ([place]) [K76.6, K31.89] 01/02/2013  . Acquired pancytopenia ([place]) [W09.811] 05/19/2011  . Insomnia [G47.00] 12/21/2010  . Alcoholic cirrhosis of liver ([place]) [K70.30] 07/15/2010  . Idiopathic peripheral autonomic neuropathy [G90.09] 05/25/2007  . Idiopathic peripheral autonomic neuropathy, unspecified [G90.09] 05/25/2007  . SINUSITIS, CHRONIC MAXILLARY [J32.0] 03/06/2007  . Human immunodeficiency virus (HIV) disease ([place]) [B20] 06/23/2006  . HYPERLIPIDEMIA [E78.5] 06/23/2006  . Anxiety state [F41.1] 06/23/2006  . Depression [F32.9] 06/23/2006  . HYPERTENSION [I10] 06/23/2006  . Reflux esophagitis [K21.0] 06/23/2006   Total Time spent with patient: 15 minutes  Past Psychiatric History: See H&P  Past Medical History:  Past Medical History:  Diagnosis Date  . Alcoholic cirrhosis of liver ([place])   . Alcoholism, chronic ([place])   . ANXIETY 06/23/2006  . Ascites 11/13/2009  . Ascites   . Avulsion fracture of middle phalanges of  3rd/4th fingers left hand 01/07/2014  . Blood dyscrasia    HIV  . Cellulitis of right lower extremity 03/02/2016  . DEPRESSION 06/23/2006  . 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 Erectile dysfunction 07/14/2015  . 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 Gastric ulcer 06/2010  . GERD (gastroesophageal reflux disease)   . HIV DISEASE 06/23/2006  . HYPERLIPIDEMIA 06/23/2006  . HYPERTENSION 06/23/2006   denies  . 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 Iron deficiency anemia   . Left foot infection   . Obesity (BMI 30-39.9)    BMI 34 kg/m^2  . Portal hypertensive gastropathy ([place]) 01/02/2013  . Recent shoulder injury    left shoulder/fell in parking lot/ no surgery/December 26, 2016  . SBP (spontaneous  bacterial peritonitis) ([place]) 05/03/2011   Suspected by high leukocytes on paracentesis. Clinical scenario also compatible. November 2012 responded to Levaquin. Started on trimethoprim-sulfamethoxazole double strength daily for prophylaxis.   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 Skin cancer    left calf  . STRAIN, CHEST WALL 03/15/2007  . Varices, esophageal ([place]) 06/2010  . Venous stasis 03/02/2016    Past Surgical History:  Procedure Laterality Date  . CARPAL TUNNEL RELEASE     left  . COLONOSCOPY  march 2013  . ESOPHAGEAL BANDING  05/01/2013   Procedure: ESOPHAGEAL BANDING;  Surgeon: Gatha Mayer, MD;  Location: [facility];  Service: Endoscopy;;  . ESOPHAGOGASTRODUODENOSCOPY  07/01/2010;  08/12/10   small varices, gastric ulcer  . ESOPHAGOGASTRODUODENOSCOPY  10/26/2011   Procedure: ESOPHAGOGASTRODUODENOSCOPY (EGD);  Surgeon: Gatha Mayer, MD;  Location: [facility];  Service: Endoscopy;  Laterality: N/A;  . ESOPHAGOGASTRODUODENOSCOPY N/A 01/02/2013   Procedure: ESOPHAGOGASTRODUODENOSCOPY (EGD);  Surgeon: Gatha Mayer, MD;  Location: [facility];  Service: Endoscopy;  Laterality: N/A;  . ESOPHAGOGASTRODUODENOSCOPY N/A 04/23/2013   Procedure: ESOPHAGOGASTRODUODENOSCOPY (EGD);  Surgeon: Gatha Mayer, MD;  Location: [facility];  Service: Endoscopy;  Laterality: N/A;  . ESOPHAGOGASTRODUODENOSCOPY N/A 05/01/2013   Procedure: ESOPHAGOGASTRODUODENOSCOPY (EGD);  Surgeon: Gatha Mayer, MD;  Location: [facility];  Service: Endoscopy;  Laterality:  N/A;  follow-up varices and possibly band them  . ESOPHAGOGASTRODUODENOSCOPY N/A 09/04/2013   Procedure: ESOPHAGOGASTRODUODENOSCOPY (EGD);  Surgeon: Gatha Mayer, MD;  Location: [facility];  Service: Endoscopy;  Laterality: N/A;  . ESOPHAGOGASTRODUODENOSCOPY N/A 09/10/2014   Procedure: ESOPHAGOGASTRODUODENOSCOPY (EGD);  Surgeon: Gatha Mayer, MD;  Location: [facility];  Service: Endoscopy;  Laterality: N/A;  .  ESOPHAGOGASTRODUODENOSCOPY (EGD) WITH PROPOFOL N/A 08/21/2015   Procedure: ESOPHAGOGASTRODUODENOSCOPY (EGD) WITH PROPOFOL;  Surgeon: Gatha Mayer, MD;  Location: [facility];  Service: Endoscopy;  Laterality: N/A;  . ESOPHAGOGASTRODUODENOSCOPY (EGD) WITH PROPOFOL N/A 04/06/2017   Procedure: ESOPHAGOGASTRODUODENOSCOPY (EGD) WITH PROPOFOL;  Surgeon: Gatha Mayer, MD;  Location: [facility];  Service: Endoscopy;  Laterality: N/A;  . ESOPHAGOGASTRODUODENOSCOPY W/ BANDING  06/26/2010   variceal ligation  . GASTRIC VARICES BANDING N/A 01/02/2013   Procedure: GASTRIC VARICES BANDING;  Surgeon: Gatha Mayer, MD;  Location: [facility];  Service: Endoscopy;  Laterality: N/A;  possible banding  . UPPER GASTROINTESTINAL ENDOSCOPY     Family History:  Family History  Problem Relation Age of Onset  . Hyperlipidemia Mother   . Hypertension Mother   . Breast cancer Mother        questionable  . Heart disease Mother   . Hypertension Father   . Irritable bowel syndrome Father   . Drug abuse Brother   . Heart disease Brother   . Breast cancer Maternal Aunt        maternal great aunt  . Alcohol abuse Other   . Heart disease Maternal Uncle   . Irritable bowel syndrome Paternal Aunt   . Colon cancer Neg Hx    Family Psychiatric  History: See H&P Social History:  Social History   Substance and Sexual Activity  Alcohol Use Yes   Comment: pt is an alcoholic currently in [place] meetings     Social History   Substance and Sexual Activity  Drug Use No    Social History   Socioeconomic History  . Marital status: Single    Spouse name: Not on file  . Number of children: 0  . Years of education: Not on file  . Highest education level: Not on file  Occupational History  . Occupation: disability  Social Needs  . Financial resource strain: Not on file  . Food insecurity:    Worry: Not on file    Inability: Not on file  . Transportation needs:    Medical: Not on file     Non-medical: Not on file  Tobacco Use  . Smoking status: Former Smoker    Packs/day: 0.10    Years: 10.00    Pack years: 1.00    Types: Cigarettes    Start date: 03/03/2014    Last attempt to quit: 04/03/2014    Years since quitting: 3.5  . Smokeless tobacco: Never Used  . Tobacco comment: Chews nicorette gum.  Substance and Sexual Activity  . Alcohol use: Yes    Comment: pt is an alcoholic currently in [employer]  . Drug use: No  . Sexual activity: Yes    Partners: Male    Birth control/protection: Condom    Comment: pt. declined condoms  Lifestyle  . Physical activity:    Days per week: Not on file    Minutes per session: Not on file  . Stress: Not on file  Relationships  . Social connections:    Talks on phone: Not on file    Gets together: Not on file  Attends religious service: Not on file    Active member of club or organization: Not on file    Attends meetings of clubs or organizations: Not on file    Relationship status: Not on file  Other Topics Concern  . Not on file  Social History Narrative   Single, disabled hair stylist   Lives with parents in a home with a basement.  Parents do not like for him to go down stairs because they are steep.           Additional Social History:   Sleep: Fair  Appetite:  Good  Current Medications: Current Facility-Administered Medications  Medication Dose Route Frequency Provider Last Rate Last Dose  . aMILoride (MIDAMOR) tablet 15 mg  15 mg Oral Daily Rankin, Shuvon B, NP   15 mg at 10/20/17 0925  . benzocaine (ORAJEL) 10 % mucosal gel   Mouth/Throat BID PRN Lindon Romp A, NP      . diclofenac sodium (VOLTAREN) 1 % transdermal gel 2 g  2 g Topical QID Sharma Covert, MD   2 g at 10/20/17 1429  . dolutegravir (TIVICAY) tablet 50 mg  50 mg Oral Daily Rankin, Shuvon B, NP   50 mg at 10/20/17 0926  . emtricitabine-tenofovir AF (DESCOVY) 200-25 MG per tablet 1 tablet  1 tablet Oral Daily Rankin, Shuvon B, NP    1 tablet at 10/20/17 0927  . escitalopram (LEXAPRO) tablet 5 mg  5 mg Oral Daily Pennelope Bracken, MD   5 mg at 10/20/17 6160  . furosemide (LASIX) tablet 40 mg  40 mg Oral Daily Rankin, Shuvon B, NP   40 mg at 10/20/17 0927  . hydrOXYzine (ATARAX/VISTARIL) tablet 50 mg  50 mg Oral Q6H PRN Pennelope Bracken, MD   50 mg at 10/19/17 2130  . ibuprofen (ADVIL,MOTRIN) tablet 400 mg  400 mg Oral Q6H PRN Sharma Covert, MD   400 mg at 10/19/17 0912  . lactulose (CHRONULAC) 10 GM/15ML solution 20 g  20 g Oral TID Sharma Covert, MD   20 g at 10/20/17 1429  . lidocaine (LIDODERM) 5 % 1 patch  1 patch Transdermal Q24H Derrill Center, NP   1 patch at 10/20/17 1429  . nicotine polacrilex (NICORETTE) gum 2 mg  2 mg Oral PRN Sharma Covert, MD   2 mg at 10/19/17 2132  . pantoprazole (PROTONIX) EC tablet 40 mg  40 mg Oral Daily Rankin, Shuvon B, NP   40 mg at 10/20/17 0926  . potassium chloride SA (K-DUR,KLOR-CON) CR tablet 40 mEq  40 mEq Oral BID Rankin, Shuvon B, NP   40 mEq at 10/20/17 0926  . QUEtiapine (SEROQUEL) tablet 25 mg  25 mg Oral Q8H PRN Sharma Covert, MD      . QUEtiapine (SEROQUEL) tablet 50 mg  50 mg Oral QHS Sharma Covert, MD   50 mg at 10/19/17 2130  . rifaximin (XIFAXAN) tablet 550 mg  550 mg Oral BID Rankin, Shuvon B, NP   550 mg at 10/20/17 0925  . simethicone (MYLICON) chewable tablet 80 mg  80 mg Oral TID PRN Rankin, Shuvon B, NP      . thiamine (VITAMIN B-1) tablet 100 mg  100 mg Oral Daily Pennelope Bracken, MD   100 mg at 10/20/17 0926  . traZODone (DESYREL) tablet 100 mg  100 mg Oral QHS,MR X 1 Laverle Hobby, PA-C   100 mg at 10/19/17 2132  . tuberculin  injection 5 Units  5 Units Intradermal Once Pennelope Bracken, MD   5 Units at 10/18/17 1601    Lab Results:  Results for orders placed or performed during the hospital encounter of 10/05/17 (from the past 48 hour(s))  Comprehensive metabolic panel     Status: Abnormal    Collection Time: 10/19/17  6:49 AM  Result Value Ref Range   Sodium 144 135 - 145 mmol/L   Potassium 4.1 3.5 - 5.1 mmol/L   Chloride 112 (H) 101 - 111 mmol/L   CO2 23 22 - 32 mmol/L   Glucose, Bld 86 65 - 99 mg/dL   BUN 14 6 - 20 mg/dL   Creatinine, Ser 0.97 0.61 - 1.24 mg/dL   Calcium 9.3 8.9 - 10.3 mg/dL   Total Protein 6.3 (L) 6.5 - 8.1 g/dL   Albumin 3.7 3.5 - 5.0 g/dL   AST 23 15 - 41 U/L   ALT 18 17 - 63 U/L   Alkaline Phosphatase 90 38 - 126 U/L   Total Bilirubin 1.4 (H) 0.3 - 1.2 mg/dL   GFR calc non Af Amer >60 >60 mL/min   GFR calc Af Amer >60 >60 mL/min    Comment: (NOTE) The eGFR has been calculated using the CKD EPI equation. This calculation has not been validated in all clinical situations. eGFR's persistently <60 mL/min signify possible Chronic Kidney Disease.    Anion gap 9 5 - 15    Comment: Performed at [facility], [place] [address] [place], [place] [address]  Ammonia     Status: Abnormal   Collection Time: 10/19/17  6:49 AM  Result Value Ref Range   Ammonia 69 (H) 9 - 35 umol/L    Comment: Performed at [facility], [place] [address], [place], [place] [address]    Blood Alcohol level:  Lab Results  Component Value Date   ETH <10 10/02/2017   ETH <5 70/35/0093    Metabolic Disorder Labs: No results found for: HGBA1C, MPG No results found for: PROLACTIN Lab Results  Component Value Date   CHOL 152 08/22/2017   TRIG 68 08/22/2017   HDL 56 08/22/2017   CHOLHDL 2.7 08/22/2017   VLDL 23 08/10/2016   LDLCALC 82 08/22/2017   LDLCALC 57 08/10/2016    Physical Findings: AIMS: Facial and Oral Movements Muscles of Facial Expression: None, normal Lips and Perioral Area: None, normal Jaw: None, normal Tongue: None, normal,Extremity Movements Upper (arms, wrists, hands, fingers): None, normal Lower (legs, knees, ankles, toes): None, normal, Trunk Movements Neck, shoulders, hips: None, normal, Overall  Severity Severity of abnormal movements (highest score from questions above): None, normal Incapacitation due to abnormal movements: None, normal Patient's awareness of abnormal movements (rate only patient's report): No Awareness, Dental Status Current problems with teeth and/or dentures?: No Does patient usually wear dentures?: No  CIWA:    COWS:     Musculoskeletal: Strength & Muscle Tone: within normal limits Gait & Station: normal Patient leans: N/A  Psychiatric Specialty Exam: Physical Exam  Nursing note and vitals reviewed.   Review of Systems  Constitutional: Negative for chills and fever.  Respiratory: Negative for cough and shortness of breath.   Cardiovascular: Negative for chest pain.  Gastrointestinal: Negative for abdominal pain, heartburn, nausea and vomiting.  Psychiatric/Behavioral: Negative for depression, hallucinations and suicidal ideas. The patient is nervous/anxious and has insomnia.     Blood pressure (!) 129/103, pulse (!) 123, temperature 98.1 F (36.7 C), temperature source Oral,  resp. rate 20, height _0  (1.803 m), weight (!) 147.9 kg (326 lb).Body mass index is 45.47 kg/m.  General Appearance: Casual and Fairly Groomed  Eye Contact:  Good  Speech:  Clear and Coherent and Normal Rate  Volume:  Normal  Mood:  Anxious and Depressed  Affect:  Appropriate, Congruent, Constricted, Depressed and Flat  Thought Process:  Coherent and Goal Directed  Orientation:  Other:  oriented to location and month only  Thought Content:  Logical  Suicidal Thoughts:  No  Homicidal Thoughts:  No  Memory:  Immediate;   Fair Recent;   Fair Remote;   Fair  Judgement:  Fair  Insight:  Lacking  Psychomotor Activity:  Normal  Concentration:  Concentration: Fair  Recall:  [employer] of Knowledge:  Fair  Language:  Fair  Akathisia:  No  Handed:    AIMS (if indicated):     Assets:  Communication Skills Resilience Social Support  ADL's:  Intact  Cognition:  WNL   Sleep:  Number of Hours: 4.75   Treatment Plan Summary: Daily contact with patient to assess and evaluate symptoms and progress in treatment and Medication management   -Continue inpatient hospitalization.  -Will continue today 10/20/2017 plan as below except where it is noted.  -MDD, recurrent, severe, without psychosis - Continue lexapro 47m po qDay - Continue seroquel 570mpo qhs  -Wernicke-Korsakoff Syndrome - Continue thiamine 10074mo qDay - SW investigating placement in memory care unit vs home health options due to severe cognitive impairment  -Anxiety -Continue vistaril 16m4m q6h prn anxiety  -Agitation -Continue seroquel 25mg8mq8h prn agitation  -Insomnia -Continue trazodone 16mg 56mhs prn insomnia  -HIV -Continue tivicay 16mg p71may -Continue descovy 200-25mg pe17mblet, take 1 tablet daily  -Elevated ammonia -Continue lactulose 20g TID -Reviewed result of the ammonia level done on AM of 10/16/17, at 83, trending up from the last level of 10-13-17 at 44.             -Reviewed lab current ammonia level of 10-19-17 69.  -Hepatic encephalopathy -Continue rifaximin 516mg po 40m -HTN -Continue amiloride 15mg po q1m- Continue lasix 40mg po qD25m-hypokalemia -Continue Potassium chloride 40 mEq po BID  -GERD -Continue protonix 40mg po qDa48mAcute pain -Continue voltaren gel 1%, 2g apply topically to affected area QID  -Encourage participation in groups and therapeutic milieu  -Disposition planning will be ongoing  Tjuana Vickrey,Lindell Spar9, 3:46 PMPatient ID: Camryn D HugVladimir Creeks: [birth year], 5Mar 07, [id number]

## 2017-10-20 NOTE — BHH Group Notes (Signed)
LCSW Group Therapy Note   10/20/2017 1:15pm   Type of Therapy and Topic:  Group Therapy:  Positive Affirmations   Participation Level:  Active  Description of Group: This group addressed positive affirmation toward self and others. Patients went around the room and identified two positive things about themselves and two positive things about a peer in the room. Patients reflected on how it felt to share something positive with others, to identify positive things about themselves, and to hear positive things from others. Patients were encouraged to have a daily reflection of positive characteristics or circumstances.  Therapeutic Goals 1. Patient will verbalize two of their positive qualities 2. Patient will demonstrate empathy for others by stating two positive qualities about a peer in the group 3. Patient will verbalize their feelings when voicing positive self affirmations and when voicing positive affirmations of others 4. Patients will discuss the potential positive impact on their wellness/recovery of focusing on positive traits of self and others. Summary of Patient Progress:  Talked about his struggles with living a healthy lifestyle, one that involves not drinking, eating good food and exercising.  Therapeutic Modalities Cognitive Behavioral Therapy Motivational [place], [place] 10/20/2017 4:03 PM

## 2017-10-20 NOTE — Progress Notes (Signed)
Recreation Therapy Notes  Date: 5.9.19 Time: 1000 Location: 500 Hall Dayroom  Group Topic: Communication, Team Building, Problem Solving  Goal Area(s) Addresses:  Patient will effectively work with peer towards shared goal.  Patient will identify skill used to make activity successful.  Patient will identify how skills used during activity can be used to reach post d/c goals.   Behavioral Response: Engaged  Intervention: STEM Activity   Activity: Straw Bridge. In groups of 2, groups were to build a standing bridge that could hold the weight of a 60 piece puzzle box.  Each group was given 20 straws and a long piece of masking tape.  Education: Education officer, community, Dentist.   Education Outcome: Acknowledges education/In group clarification offered/Needs additional education.   Clinical Observations/Feedback: Pt worked well with his partner.  Pt stated they used "good support and communicated".   [name], LRT/CTRS      [name] A 10/20/2017 12:46 PM

## 2017-10-21 MED ORDER — HYDROXYZINE HCL 50 MG PO TABS: 50.0000 mg | ORAL_TABLET | Freq: Four times a day (QID) | ORAL | 0 refills | Status: DC | PRN

## 2017-10-21 MED ORDER — SIMETHICONE 80 MG PO CHEW: 80.0000 mg | CHEWABLE_TABLET | Freq: Three times a day (TID) | ORAL | 0 refills | Status: DC | PRN

## 2017-10-21 MED ORDER — TRAZODONE HCL 100 MG PO TABS: 100.0000 mg | ORAL_TABLET | Freq: Every evening | ORAL | 0 refills | Status: DC | PRN

## 2017-10-21 MED ORDER — ESCITALOPRAM OXALATE 5 MG PO TABS: 5.0000 mg | ORAL_TABLET | Freq: Every day | ORAL | 0 refills | Status: DC

## 2017-10-21 MED ORDER — NICOTINE POLACRILEX 2 MG MT GUM: 2.0000 mg | CHEWING_GUM | OROMUCOSAL | 0 refills | Status: DC | PRN

## 2017-10-21 MED ORDER — PANTOPRAZOLE SODIUM 40 MG PO TBEC: 40.0000 mg | DELAYED_RELEASE_TABLET | Freq: Every day | ORAL | Status: DC

## 2017-10-21 MED ORDER — LACTULOSE 10 GM/15ML PO SOLN: 20.0000 g | Freq: Three times a day (TID) | ORAL | 0 refills | Status: DC

## 2017-10-21 MED ORDER — DICLOFENAC SODIUM 1 % TD GEL: 2.0000 g | Freq: Four times a day (QID) | TRANSDERMAL | Status: DC

## 2017-10-21 MED ORDER — POTASSIUM CHLORIDE CRYS ER 20 MEQ PO TBCR: 40.0000 meq | EXTENDED_RELEASE_TABLET | Freq: Two times a day (BID) | ORAL | 0 refills | Status: DC

## 2017-10-21 MED ORDER — LIDOCAINE 5 % EX PTCH: 1.0000 | MEDICATED_PATCH | CUTANEOUS | 0 refills | Status: DC

## 2017-10-21 MED ORDER — BENZOCAINE 10 % MT GEL: Freq: Two times a day (BID) | OROMUCOSAL | 0 refills | Status: DC | PRN

## 2017-10-21 MED ORDER — THIAMINE HCL 100 MG PO TABS: 100.0000 mg | ORAL_TABLET | Freq: Every day | ORAL | 0 refills | Status: DC

## 2017-10-21 MED ORDER — AMILORIDE HCL 5 MG PO TABS: 15.0000 mg | ORAL_TABLET | Freq: Every day | ORAL | Status: DC

## 2017-10-21 MED ORDER — EMTRICITABINE-TENOFOVIR AF 200-25 MG PO TABS: 1.0000 | ORAL_TABLET | Freq: Every day | ORAL | 0 refills | Status: DC

## 2017-10-21 MED ORDER — DOLUTEGRAVIR SODIUM 50 MG PO TABS: 50.0000 mg | ORAL_TABLET | Freq: Every day | ORAL | 0 refills | Status: DC

## 2017-10-21 MED ORDER — FUROSEMIDE 40 MG PO TABS: 40.0000 mg | ORAL_TABLET | Freq: Every day | ORAL | 0 refills | Status: DC

## 2017-10-21 NOTE — Progress Notes (Signed)
D: Patient denies SI, HI or AVH this evening. Patient is visualized in the dayroom interacting with staff and others on the unit.  Pt. Is calm, appropriate to circumstance and otherwise pleasant and cooperative.  Pt.  Attended evening wrap up group, was attentive and participated stating, "it was a laid back day".  Pt. Stated that he worked on his discharge plan which was successful and he will discharge later today.  A: Patient given emotional support from RN. Patient encouraged to come to staff with concerns and/or questions. Patient's medication routine continued. Patient's orders and plan of care reviewed.   R: Patient remains appropriate and cooperative. Will continue to monitor patient q15 minutes for safety.

## 2017-10-21 NOTE — [facility] Suicide Risk Assessment (Signed)
[facility] Discharge Suicide Risk Assessment   Principal Problem: Wernicke-Korsakoff syndrome Discharge Diagnoses:  Patient Active Problem List   Diagnosis Date Noted  . Korsakoff syndrome [F04]   . 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 Alcohol use disorder, severe, dependence ([place]) [F10.20] 10/11/2017  . MDD (major depressive disorder), recurrent severe, without psychosis ([place]) [F33.2]   . Alcoholism ([place]) [F10.20] 10/03/2017  . Obesity, Class III, BMI 40-49.9 (morbid obesity) ([place]) [E66.01] 10/03/2017  . Hepatic encephalopathy ([place]) [K72.90] 10/02/2017  . Suicidal ideation [R45.851]   . Secondary esophageal varices with bleeding (HCC) [I85.11]   . Venous stasis [I87.8] 03/02/2016  . Secondary esophageal varices without bleeding (HCC) [I85.10]   . Erectile dysfunction [N52.9] 07/14/2015  . Encephalopathy, hepatic ([place]) [K72.90] 01/23/2015  . Peripheral edema [R60.9] 12/31/2014  . Constipation [K59.00] 07/15/2014  . Fall [W19.XXXA] 01/07/2014  . Orthostatic hypotension [I95.1] 05/28/2013  . Personal history of failed moderate sedation- MUST HAVE MAC OR GENERAL [Z92.83] 05/01/2013  . Portal hypertensive gastropathy ([place]) [K76.6, K31.89] 01/02/2013  . Acquired pancytopenia ([place]) [E42.353] 05/19/2011  . Insomnia [G47.00] 12/21/2010  . Alcoholic cirrhosis of liver ([place]) [K70.30] 07/15/2010  . Idiopathic peripheral autonomic neuropathy [G90.09] 05/25/2007  . Idiopathic peripheral autonomic neuropathy, unspecified [G90.09] 05/25/2007  . SINUSITIS, CHRONIC MAXILLARY [J32.0] 03/06/2007  . Human immunodeficiency virus (HIV) disease ([place]) [B20] 06/23/2006  . HYPERLIPIDEMIA [E78.5] 06/23/2006  . Anxiety state [F41.1] 06/23/2006  . Depression [F32.9] 06/23/2006  . HYPERTENSION [I10] 06/23/2006  . Reflux esophagitis [K21.0] 06/23/2006    Total Time spent with patient: 30 minutes  Musculoskeletal: Strength & Muscle Tone: within normal limits Gait & Station: normal Patient leans:  N/A  Psychiatric Specialty Exam: Review of Systems  Constitutional: Negative for chills and fever.  Respiratory: Negative for cough and shortness of breath.   Cardiovascular: Negative for chest pain.  Gastrointestinal: Negative for abdominal pain, heartburn, nausea and vomiting.  Psychiatric/Behavioral: Negative for depression, hallucinations and suicidal ideas. The patient is not nervous/anxious and does not have insomnia.     Blood pressure (!) 148/74, pulse 72, temperature 97.8 F (36.6 C), temperature source Oral, resp. rate 20, height 5' 11"  (1.803 m), weight (!) 147.9 kg (326 lb).Body mass index is 45.47 kg/m.  General Appearance: Casual and Fairly Groomed  Engineer, water::  Good  Speech:  Clear and Coherent and Normal Rate  Volume:  Normal  Mood:  Euthymic  Affect:  Appropriate, Congruent and Constricted  Thought Process:  Coherent and Goal Directed  Orientation:  Full (Time, Place, and Person)  Thought Content:  Logical  Suicidal Thoughts:  No  Homicidal Thoughts:  No  Memory:  Immediate;   Fair Recent;   Fair Remote;   Fair  Judgement:  Fair  Insight:  Fair  Psychomotor Activity:  Normal  Concentration:  Fair  Recall:  [employer] of Knowledge:Fair  Language: Fair  Akathisia:  No  Handed:    AIMS (if indicated):     Assets:  Resilience Social Support  Sleep:  Number of Hours: 5.75  Cognition: WNL  ADL's:  Intact   Mental Status Per Nursing Assessment::   On Admission:  NA  Demographic Factors:  Male, Caucasian, Gay, lesbian, or bisexual orientation, Low socioeconomic status and Unemployed  Loss Factors: Loss of significant relationship, Decline in physical health and Financial problems/change in socioeconomic status  Historical Factors: Prior suicide attempts, Family history of mental illness or substance abuse and Impulsivity  Risk Reduction Factors:   Sense of responsibility to family, Living with another  person, especially a relative, Positive social  support, Positive therapeutic relationship and Positive coping skills or problem solving skills  Continued Clinical Symptoms:  Severe Anxiety and/or Agitation Depression:   Comorbid alcohol abuse/dependence Impulsivity Severe Alcohol/Substance Abuse/Dependencies More than one psychiatric diagnosis Unstable or Poor Therapeutic Relationship Previous Psychiatric Diagnoses and Treatments Medical Diagnoses and Treatments/Surgeries  Cognitive Features That Contribute To Risk:  None    Suicide Risk:  Minimal: No identifiable suicidal ideation.  Patients presenting with no risk factors but with morbid ruminations; may be classified as minimal risk based on the severity of the depressive symptoms  Follow-up [place], [place]. Go on 10/25/2017.   Why:  Tuesday at 9:45 for your hospital follow up appointment.  Bring your ID, insurance card and your hospital d/c paperwork. Also, I tried to refer you for CST services tih [facility] at [phone], but they will not be up and running until June 1.   Contact information: [place] [address] [phone]         Subjective Data:  [name] is a 52 y/o M with history of MDD, alcohol use disorder, HIV, hepatic cirrhosis, and recently elevated ammonia with hepatic encephalopathy who was admitted after intentional overdose of xanax and alcoholin context of increased depression related to death of his mother unexpectedly on 3/31. Pt was found to have elevated ammonia, and it remains elevated during this admission despite trending down with use of lactulose. He was transferred to [facility] for additional treatment and stabilization of his psychiatric symptoms. He was continued on home medication of amitriptyline and monitored on the inpatient unit. Pt has been reporting improvement of his mood symptoms; however, he has episodic confusion and disorientation. Yesterday evening he disrobed and was confused, but he  was able to be redirected. Due to his behaviors he was transferred to the 500 hall.He was changed from amitriptyline to lexapro due reduced sedation and he was discontinued from ativan. Pt has been reporting improvement of his mood symptoms, but he remains confused and disoriented on the unitat times; however, he is able to be redirected. He had MOCA administered with score of 6 out of 30 on 10/12/17. Pt had some improvement of his mood symptoms, confusion, and behavioral symptoms, but he remains disoriented at times while on the unit. However, pt had overall improvement of his memory and mood symptoms, and remained mostly oriented consistently for several days on the unit.  Today upon evaluation, pt shares, "I'm good. No problems except for some low back pain, but that's nothing new." Pt reports that overall he is doing well. He denies other specific concerns. He is sleeping well. His appetite is good. He denies SI/HI/AH/VH. He is alert and oriented x3 today. He is tolerating his medications well, and he is in agreement to continue his current regimen without changes. He plans to follow up a [facility] for outpatient services. Discussed with patient at length about risks of alcohol use due to his recent history of memory difficulties which are consisted with Wernicke's and possible early stages of Wernicke-Korsakoff syndrome; discussed with patient that changes to brain consistent with Wernicke's had been observed in 2014 on MRI and he has continued to have substance use and withdrawal since that time which puts him at continued risk of developing permanent memory deficits. Also discussed that his history of cirrhosis and HIV puts him at additional risk. Pt verbalized good understanding and confirmed that he plans to avoid  all substance use including alcohol and benzodiazepines. He was able to engage in safety planning including plan to return to [facility] or contact emergency services if he feels unable to maintain his  own safety or the safety of others. Pt had no further questions, comments, or concerns.   Plan Of Care/Follow-up recommendations:   -Discharge to outpatient level of care  -MDD, recurrent, severe, without psychosis - Continue lexapro 79m po qDay - Continue seroquel 514mpo qhs  -Wernicke-Korsakoff Syndrome - Continue thiamine 10025mo qDay  -Anxiety -Continue vistaril 55m36m q6h prn anxiety  -Insomnia -Continue trazodone 55mg48mqhs prn insomnia  -HIV -Continue tivicay 55mg 54mDay -Continue descovy 200-25mg p29mablet, take 1 tablet daily  -Elevated ammonia -Continue lactulose 20g TID  -Hepatic encephalopathy -Continue rifaximin 555mg po33m  -HTN -Continue amiloride 15mg po 107m - Continue lasix 40mg po q45m -GERD -Continue protonix 40mg po qD41m-Acute pain -Continue voltaren gel 1%, 2g apply topically to affected area QID  Activity:  as tolerated Diet:  normal Tests:  NA Other:  see above for DC plan  ChristopherPennelope Bracken019, 9:50 AM

## 2017-10-21 NOTE — Discharge Summary (Addendum)
Physician Discharge Summary Note  Patient:  [name] is an 52 y.o., male MRN:  [id number] DOB:  07/10/1965 Patient phone:  [phone] (home)  Patient address:   [place] [address],  Total Time spent with patient: Greater than 30 minutes  Date of Admission:  10/05/2017 Date of Discharge: 10-21-17  Reason for Admission: Intentional overdose of xanax and alcoholin context of increased depression related to death of his mother unexpectedly on 3/31.  Principal Problem: Wernicke-Korsakoff syndrome  Discharge Diagnoses: Patient Active Problem List   Diagnosis Date Noted  . Korsakoff syndrome [F04]   . 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 Alcohol use disorder, severe, dependence ([place]) [F10.20] 10/11/2017  . MDD (major depressive disorder), recurrent severe, without psychosis ([place]) [F33.2]   . Alcoholism ([place]) [F10.20] 10/03/2017  . Obesity, Class III, BMI 40-49.9 (morbid obesity) ([place]) [E66.01] 10/03/2017  . Hepatic encephalopathy ([place]) [K72.90] 10/02/2017  . Suicidal ideation [R45.851]   . Secondary esophageal varices with bleeding (HCC) [I85.11]   . Venous stasis [I87.8] 03/02/2016  . Secondary esophageal varices without bleeding (HCC) [I85.10]   . Erectile dysfunction [N52.9] 07/14/2015  . Encephalopathy, hepatic ([place]) [K72.90] 01/23/2015  . Peripheral edema [R60.9] 12/31/2014  . Constipation [K59.00] 07/15/2014  . Fall [W19.XXXA] 01/07/2014  . Orthostatic hypotension [I95.1] 05/28/2013  . Personal history of failed moderate sedation- MUST HAVE MAC OR GENERAL [Z92.83] 05/01/2013  . Portal hypertensive gastropathy ([place]) [K76.6, K31.89] 01/02/2013  . Acquired pancytopenia ([place]) [W58.099] 05/19/2011  . Insomnia [G47.00] 12/21/2010  . Alcoholic cirrhosis of liver ([place]) [K70.30] 07/15/2010  . Idiopathic peripheral autonomic neuropathy [G90.09] 05/25/2007  . Idiopathic peripheral autonomic neuropathy, unspecified [G90.09] 05/25/2007  . SINUSITIS,  CHRONIC MAXILLARY [J32.0] 03/06/2007  . Human immunodeficiency virus (HIV) disease ([place]) [B20] 06/23/2006  . HYPERLIPIDEMIA [E78.5] 06/23/2006  . Anxiety state [F41.1] 06/23/2006  . Depression [F32.9] 06/23/2006  . HYPERTENSION [I10] 06/23/2006  . Reflux esophagitis [K21.0] 06/23/2006   Past Psychiatric History: See H&P  Past Medical History:  Past Medical History:  Diagnosis Date  . Alcoholic cirrhosis of liver ([place])   . Alcoholism, chronic ([place])   . ANXIETY 06/23/2006  . Ascites 11/13/2009  . Ascites   . Avulsion fracture of middle phalanges of  3rd/4th fingers left hand 01/07/2014  . Blood dyscrasia    HIV  . Cellulitis of right lower extremity 03/02/2016  . DEPRESSION 06/23/2006  . 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 Erectile dysfunction 07/14/2015  . 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 Gastric ulcer 06/2010  . GERD (gastroesophageal reflux disease)   . HIV DISEASE 06/23/2006  . HYPERLIPIDEMIA 06/23/2006  . HYPERTENSION 06/23/2006   denies  . 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 Iron deficiency anemia   . Left foot infection   . Obesity (BMI 30-39.9)    BMI 34 kg/m^2  . Portal hypertensive gastropathy ([place]) 01/02/2013  . Recent shoulder injury    left shoulder/fell in parking lot/ no surgery/December 26, 2016  . SBP (spontaneous bacterial peritonitis) ([place]) 05/03/2011   Suspected by high leukocytes on paracentesis. Clinical scenario also compatible. November 2012 responded to Levaquin. Started on trimethoprim-sulfamethoxazole double strength daily for prophylaxis.   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 Skin cancer    left calf  . STRAIN, CHEST WALL 03/15/2007  . Varices, esophageal ([place]) 06/2010  . Venous stasis 03/02/2016    Past Surgical History:  Procedure Laterality Date  . CARPAL TUNNEL RELEASE     left  . COLONOSCOPY  march  2013  . ESOPHAGEAL BANDING  05/01/2013   Procedure: ESOPHAGEAL BANDING;  Surgeon: Gatha Mayer, MD;   Location: [facility];  Service: Endoscopy;;  . ESOPHAGOGASTRODUODENOSCOPY  07/01/2010;  08/12/10   small varices, gastric ulcer  . ESOPHAGOGASTRODUODENOSCOPY  10/26/2011   Procedure: ESOPHAGOGASTRODUODENOSCOPY (EGD);  Surgeon: Gatha Mayer, MD;  Location: [facility];  Service: Endoscopy;  Laterality: N/A;  . ESOPHAGOGASTRODUODENOSCOPY N/A 01/02/2013   Procedure: ESOPHAGOGASTRODUODENOSCOPY (EGD);  Surgeon: Gatha Mayer, MD;  Location: [facility];  Service: Endoscopy;  Laterality: N/A;  . ESOPHAGOGASTRODUODENOSCOPY N/A 04/23/2013   Procedure: ESOPHAGOGASTRODUODENOSCOPY (EGD);  Surgeon: Gatha Mayer, MD;  Location: [facility];  Service: Endoscopy;  Laterality: N/A;  . ESOPHAGOGASTRODUODENOSCOPY N/A 05/01/2013   Procedure: ESOPHAGOGASTRODUODENOSCOPY (EGD);  Surgeon: Gatha Mayer, MD;  Location: [facility];  Service: Endoscopy;  Laterality: N/A;  follow-up varices and possibly band them  . ESOPHAGOGASTRODUODENOSCOPY N/A 09/04/2013   Procedure: ESOPHAGOGASTRODUODENOSCOPY (EGD);  Surgeon: Gatha Mayer, MD;  Location: [facility];  Service: Endoscopy;  Laterality: N/A;  . ESOPHAGOGASTRODUODENOSCOPY N/A 09/10/2014   Procedure: ESOPHAGOGASTRODUODENOSCOPY (EGD);  Surgeon: Gatha Mayer, MD;  Location: [facility];  Service: Endoscopy;  Laterality: N/A;  . ESOPHAGOGASTRODUODENOSCOPY (EGD) WITH PROPOFOL N/A 08/21/2015   Procedure: ESOPHAGOGASTRODUODENOSCOPY (EGD) WITH PROPOFOL;  Surgeon: Gatha Mayer, MD;  Location: [facility];  Service: Endoscopy;  Laterality: N/A;  . ESOPHAGOGASTRODUODENOSCOPY (EGD) WITH PROPOFOL N/A 04/06/2017   Procedure: ESOPHAGOGASTRODUODENOSCOPY (EGD) WITH PROPOFOL;  Surgeon: Gatha Mayer, MD;  Location: [facility];  Service: Endoscopy;  Laterality: N/A;  . ESOPHAGOGASTRODUODENOSCOPY W/ BANDING  06/26/2010   variceal ligation  . GASTRIC VARICES BANDING N/A 01/02/2013   Procedure: GASTRIC VARICES BANDING;  Surgeon: Gatha Mayer, MD;  Location: [facility];   Service: Endoscopy;  Laterality: N/A;  possible banding  . UPPER GASTROINTESTINAL ENDOSCOPY     Family History:  Family History  Problem Relation Age of Onset  . Hyperlipidemia Mother   . Hypertension Mother   . Breast cancer Mother        questionable  . Heart disease Mother   . Hypertension Father   . Irritable bowel syndrome Father   . Drug abuse Brother   . Heart disease Brother   . Breast cancer Maternal Aunt        maternal great aunt  . Alcohol abuse Other   . Heart disease Maternal Uncle   . Irritable bowel syndrome Paternal Aunt   . Colon cancer Neg Hx    Family Psychiatric  History: See H&P Social History:  Social History   Substance and Sexual Activity  Alcohol Use Yes   Comment: pt is an alcoholic currently in [place] meetings     Social History   Substance and Sexual Activity  Drug Use No    Social History   Socioeconomic History  . Marital status: Single    Spouse name: Not on file  . Number of children: 0  . Years of education: Not on file  . Highest education level: Not on file  Occupational History  . Occupation: disability  Social Needs  . Financial resource strain: Not on file  . Food insecurity:    Worry: Not on file    Inability: Not on file  . Transportation needs:    Medical: Not on file    Non-medical: Not on file  Tobacco Use  . Smoking status: Former Smoker    Packs/day: 0.10    Years: 10.00    Pack years: 1.00  Types: Cigarettes    Start date: 03/03/2014    Last attempt to quit: 04/03/2014    Years since quitting: 3.5  . Smokeless tobacco: Never Used  . Tobacco comment: Chews nicorette gum.  Substance and Sexual Activity  . Alcohol use: Yes    Comment: pt is an alcoholic currently in [employer]  . Drug use: No  . Sexual activity: Yes    Partners: Male    Birth control/protection: Condom    Comment: pt. declined condoms  Lifestyle  . Physical activity:    Days per week: Not on file    Minutes per  session: Not on file  . Stress: Not on file  Relationships  . Social connections:    Talks on phone: Not on file    Gets together: Not on file    Attends religious service: Not on file    Active member of club or organization: Not on file    Attends meetings of clubs or organizations: Not on file    Relationship status: Not on file  Other Topics Concern  . Not on file  Social History Narrative   Single, disabled hair stylist   Lives with parents in a home with a basement.  Parents do not like for him to go down stairs because they are steep.           Hospital Course: (Per Md's discharge SRA): [name] is a 52 y/o M with history of MDD, alcohol use disorder, HIV, hepatic cirrhosis, and recently elevated ammonia with hepatic encephalopathy who was admitted after intentional overdose of xanax and alcoholin context of increased depression related to death of his mother unexpectedly on 3/31. Pt was found to have elevated ammonia, and it remains elevated during this admission despite trending down with use of lactulose. He was transferred to [facility] for additional treatment and stabilization of his psychiatric symptoms. He was continued on home medication of amitriptyline and monitored on the inpatient unit. Pt has been reporting improvement of his mood symptoms; however, he has episodic confusion and disorientation. Yesterday evening he disrobed and was confused, but he was able to be redirected. Due to his behaviors he was transferred to the 500 hall.He was changed from amitriptyline to lexapro due reduced sedation and he was discontinued from ativan. Pt has been reporting improvement of his mood symptoms, but he remains confused and disoriented on the unitat times; however, he is able to be redirected.He had MOCA administered with score of 6 out of 30 on 10/12/17. Pt had some improvement of his mood symptoms, confusion, and behavioral symptoms, but he remains disoriented at times while on the  unit. However, pt had overall improvement of his memory and mood symptoms, and remained mostly oriented consistently for several days on the unit.  Besides the use of Lexapro 5 mg for depression, Willey was also medicated & discharged on; Hydroxyzine 50 mg prn for anxiety, Thiamine 100 mg for thiamine deficiency & Trazodone 100 mg for insomnia. He was enrolled & participated in the group counseling sessions being offered & held on this unit. He learned coping skills. He presented other significant pre-existing medical issues that required treatments. He was resumed & discharged on all his pertinent home medications for those health issues. He tolerated his treatment regimen without any adverse effects or reactions reported.   Today upon his discharge evaluation, pt shares, "I'm good. No problems except for some low back pain, but that's nothing new." Pt reports that overall he is doing  well. He denies other specific concerns. He is sleeping well. His appetite is good. He denies SI/HI/AH/VH. He is alert and oriented x3 today. He is tolerating his medications well, and he is in agreement to continue his current regimen without changes. He plans to follow up a [facility] for outpatient services. Discussed with patient at length about risks of alcohol use due to his recent history of memory difficulties which are consisted with Wernicke's and possible early stages of Wernicke-Korsakoff syndrome; discussed with patient that changes to brain consistent with Wernicke's had been observed in 2014 on MRI and he has continued to have substance use and withdrawal since that time which puts him at continued risk of developing permanent memory deficits. Also discussed that his history of cirrhosis and HIV puts him at additional risk. Pt verbalized good understanding and confirmed that he plans to avoid all substance use including alcohol and benzodiazepines. He was able to engage in safety planning including plan to return to  [facility] or contact emergency services if he feels unable to maintain his own safety or the safety of others. Pt had no further questions, comments, or concerns.  Upon discharge, [name] presents mentally & medically stable. He will continue mental health care on an outpatient basis as noted below. He is provided with all the necessary information needed to make this appointment without problems. He left [facility] with all personal belongings in no apparent distress. Transportation per family.  Physical Findings: AIMS: Facial and Oral Movements Muscles of Facial Expression: None, normal Lips and Perioral Area: None, normal Jaw: None, normal Tongue: None, normal,Extremity Movements Upper (arms, wrists, hands, fingers): None, normal Lower (legs, knees, ankles, toes): None, normal, Trunk Movements Neck, shoulders, hips: None, normal, Overall Severity Severity of abnormal movements (highest score from questions above): None, normal Incapacitation due to abnormal movements: None, normal Patient's awareness of abnormal movements (rate only patient's report): No Awareness, Dental Status Current problems with teeth and/or dentures?: No Does patient usually wear dentures?: No  CIWA:    COWS:     Musculoskeletal: Strength & Muscle Tone: within normal limits Gait & Station: normal Patient leans: N/A  Psychiatric Specialty Exam: Physical Exam  Constitutional: He appears well-developed.  HENT:  Head: Normocephalic.  Eyes: Pupils are equal, round, and reactive to light.  Neck: Normal range of motion.  Cardiovascular:  Hx. HTN  Respiratory: Effort normal.  GI: Soft.  Genitourinary:  Genitourinary Comments: Deferred  Musculoskeletal: Normal range of motion.  Neurological: He is alert.  Skin: Skin is warm.    Review of Systems  Constitutional: Negative.   HENT: Negative.   Eyes: Negative.   Respiratory: Negative.   Cardiovascular: Negative.   Gastrointestinal: Negative.   Genitourinary:  Negative.   Musculoskeletal: Negative.   Skin: Negative.   Neurological: Negative.   Endo/Heme/Allergies: Negative.   Psychiatric/Behavioral: Negative for depression, hallucinations, memory loss, substance abuse (Hx. Alcohol & Benzodiazepine use disorder) and suicidal ideas. The patient is not nervous/anxious and does not have insomnia (Stable).     Blood pressure (!) 148/74, pulse 72, temperature 97.8 F (36.6 C), temperature source Oral, resp. rate 20, height _0  (1.803 m), weight (!) 147.9 kg (326 lb).Body mass index is 45.47 kg/m.  See Md's SRA   Have you used any form of tobacco in the last 30 days? (Cigarettes, Smokeless Tobacco, Cigars, and/or Pipes): No  Has this patient used any form of tobacco in the last 30 days? (Cigarettes, Smokeless Tobacco, Cigars, and/or Pipes): Yes, an FDA-approved tobacco cessation  medication was offered at discharge.  Blood Alcohol level:  Lab Results  Component Value Date   ETH <10 10/02/2017   ETH <5 00/93/8182   Metabolic Disorder Labs:  No results found for: HGBA1C, MPG No results found for: PROLACTIN Lab Results  Component Value Date   CHOL 152 08/22/2017   TRIG 68 08/22/2017   HDL 56 08/22/2017   CHOLHDL 2.7 08/22/2017   VLDL 23 08/10/2016   LDLCALC 82 08/22/2017   LDLCALC 57 08/10/2016   See Psychiatric Specialty Exam and Suicide Risk Assessment completed by Attending Physician prior to discharge.  Discharge destination:  Home  Is patient on multiple antipsychotic therapies at discharge:  No   Has Patient had three or more failed trials of antipsychotic monotherapy by history:  No  Recommended Plan for Multiple Antipsychotic Therapies: NA  Allergies as of 10/21/2017      Reactions   Penicillins    REACTION: hives Has patient had a PCN reaction causing immediate rash, facial/tongue/throat swelling, SOB or lightheadedness with hypotension: Yes Has patient had a PCN reaction causing severe rash involving mucus membranes or  skin necrosis: Yes Has patient had a PCN reaction that required hospitalization No Has patient had a PCN reaction occurring within the last 10 years: Yes If all of the above answers are "NO", then may proceed with Cephalosporin use./Per pt makes him feel weird!   Sulfa Antibiotics    Pt states he did not have a reaction last time to Sulfa!      Medication List    STOP taking these medications   AMBULATORY NON FORMULARY MEDICATION   esomeprazole 40 MG capsule Commonly known as:  [place] by:  pantoprazole 40 MG tablet   senna 8.6 MG Tabs tablet Commonly known as:  SENOKOT   triamcinolone cream 0.1 % Commonly known as:  KENALOG   XIFAXAN 550 MG Tabs tablet Generic drug:  rifaximin     TAKE these medications     Indication  aMILoride 5 MG tablet Commonly known as:  MIDAMOR Take 3 tablets (15 mg total) by mouth daily. For high blood pressure Start taking on:  10/22/2017 What changed:    how much to take  how to take this  when to take this  additional instructions  Indication:  High Blood Pressure Disorder   benzocaine 10 % mucosal gel Commonly known as:  ORAJEL Use as directed in the mouth or throat 2 (two) times daily as needed for mouth pain.  Indication:  Mouth pain   diclofenac sodium 1 % Gel Commonly known as:  VOLTAREN Apply 2 g topically 4 (four) times daily.  Indication:  Joint Damage causing Pain and Loss of Function   dolutegravir 50 MG tablet Commonly known as:  TIVICAY Take 1 tablet (50 mg total) by mouth daily. For HIV infection. Start taking on:  10/22/2017 What changed:  additional instructions  Indication:  HIV Disease   emtricitabine-tenofovir AF 200-25 MG tablet Commonly known as:  DESCOVY Take 1 tablet by mouth daily. For HIV infection What changed:  additional instructions  Indication:  HIV Disease   escitalopram 5 MG tablet Commonly known as:  LEXAPRO Take 1 tablet (5 mg total) by mouth daily. For depression Start taking on:   10/22/2017  Indication:  Generalized Anxiety Disorder, Major Depressive Disorder   furosemide 40 MG tablet Commonly known as:  LASIX Take 1 tablet (40 mg total) by mouth daily. For swellings Start taking on:  10/22/2017 What changed:  how much to take  how to take this  when to take this  additional instructions  Indication:  Edema, High Blood Pressure Disorder   hydrOXYzine 50 MG tablet Commonly known as:  ATARAX/VISTARIL Take 1 tablet (50 mg total) by mouth every 6 (six) hours as needed (mild/moderate anxiety).  Indication:  Feeling Anxious   lactulose 10 GM/15ML solution Commonly known as:  CHRONULAC Take 30 mLs (20 g total) by mouth 3 (three) times daily. To rid ammonia build-up What changed:  See the new instructions.  Indication:  Impaired Brain Function due to Liver Disease   lidocaine 5 % Commonly known as:  LIDODERM Place 1 patch onto the skin daily. Remove & Discard patch within 12 hours or as directed by MD: For pain management  Indication:  pain management   nicotine polacrilex 2 MG gum Commonly known as:  NICORETTE Take 1 each (2 mg total) by mouth as needed for smoking cessation. (may purchase from over the counter): For smoking cessation  Indication:  Nicotine Addiction   pantoprazole 40 MG tablet Commonly known as:  PROTONIX Take 1 tablet (40 mg total) by mouth daily. For acid reflux Start taking on:  10/22/2017 Replaces:  esomeprazole 40 MG capsule  Indication:  Gastroesophageal Reflux Disease   potassium chloride SA 20 MEQ tablet Commonly known as:  K-DUR,KLOR-CON Take 2 tablets (40 mEq total) by mouth 2 (two) times daily. For low potassium replacement What changed:  additional instructions  Indication:  Low Amount of Potassium in the Blood   simethicone 80 MG chewable tablet Commonly known as:  GAS-X Chew 1 tablet (80 mg total) by mouth 3 (three) times daily as needed (for flatulence). What changed:    medication strength  how much to  take  Indication:  Gas   thiamine 100 MG tablet Take 1 tablet (100 mg total) by mouth daily. For thiamine replacement Start taking on:  10/22/2017  Indication:  Deficiency of Vitamin B1   traZODone 100 MG tablet Commonly known as:  DESYREL Take 1 tablet (100 mg total) by mouth at bedtime as needed for sleep. For sleep  Indication:  [place], Best boy. Go on 10/25/2017.   Why:  Tuesday at 9:45 for your hospital follow up appointment.  Bring your ID, insurance card and your hospital d/c paperwork. Also, I tried to refer you for CST services tih [facility] at [phone], but they will not be up and running until June 1.   Contact information: [place] [address] [phone]          Follow-up recommendations: Activity:  As tolerated Diet: As recommended by your primary care doctor. Keep all scheduled follow-up appointments as recommended.   Comments: Patient is instructed prior to discharge to: Take all medications as prescribed by his/her mental healthcare provider. Report any adverse effects and or reactions from the medicines to his/her outpatient provider promptly. Patient has been instructed & cautioned: To not engage in alcohol and or illegal drug use while on prescription medicines. In the event of worsening symptoms, patient is instructed to call the crisis hotline, 911 and or go to the nearest ED for appropriate evaluation and treatment of symptoms. To follow-up with his/her primary care provider for your other medical issues, concerns and or health care needs.   Signed: Lindell Spar, NP, PMHNP, FNP-BC 10/21/2017, 9:19 AM   Patient seen, Suicide Assessment Completed.  Disposition  Plan Reviewed

## 2017-10-21 NOTE — Progress Notes (Signed)
Patient ID: [name], male   DOB: 1965-10-31, 52 y.o.   MRN: [id number] Patient discharged to home/self care in the company of his family.  Patient denies SI, Hi and AVH upon discharge. Patient acknowledged understanding of all discharge instructions and receipt of personal belongings.  Patient was pleasant and bright upon discharge.

## 2017-10-21 NOTE — Progress Notes (Signed)
Recreation Therapy Notes  Date: 5.10.19 Time: 1000 Location: 500 Hall Dayroom  Group Topic: Stress Management  Goal Area(s) Addresses:  Patient will verbalize importance of using healthy stress management.  Patient will identify positive emotions associated with healthy stress management.   Behavioral Response: Engaged  Intervention: Stress Management  Activity :  Loving-Kindness Meditation.  LRT played [name] meditation on focusing on giving love to people in our lives.  Patients were to follow along as the meditation played.  Education:  Stress Management, Discharge Planning.   Education Outcome: Acknowledges edcuation/In group clarification offered/Needs additional education  Clinical Observations/Feedback: Pt was attentive and engaged throughout activity.  Pt was able to focus and stay on task.    [name], LRT/CTRS     Ria Comment, [name] 10/21/2017 12:12 PM

## 2017-10-26 ENCOUNTER — Telehealth: Payer: Self-pay | Admitting: Internal Medicine

## 2017-10-26 MED FILL — TIVICAY 50 MG TABLET: 50 | 30 days supply | Qty: 30 | Fill #2

## 2017-10-26 MED FILL — DESCOVY 200-25 MG TABS: 200-25 | 30 days supply | Qty: 30 | Fill #2

## 2017-10-26 NOTE — Telephone Encounter (Signed)
Left message for [name] to call back

## 2017-10-26 NOTE — Telephone Encounter (Signed)
I agree completely.

## 2017-10-26 NOTE — Telephone Encounter (Signed)
Patient's brother reports that patient was weaned off of Xanax when he was admitted to behavioral health and wants to make sure that if Armanii calls and asks for a refill of xanax that this is not granted.  He has an appt with [facility] on Friday and they will be in charge of prescribing any anxiolytics.    I did ask Johnny to come in and sign a release with B[facility] so we can continue to speak with him.  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

## 2017-10-26 NOTE — Telephone Encounter (Signed)
Patient states he needs all of his medications refilled due to his brother poring them down the drain. Please see note below. Not sure how to handle this.

## 2017-11-01 NOTE — Telephone Encounter (Signed)
Patient calling requesting to speak with Barbera Setters or PJ regarding mediation refill.

## 2017-11-02 NOTE — Telephone Encounter (Signed)
Cell phone voice mail full and cannot except any more messages. Home number rings busy, busy. I will try to reach him later.

## 2017-11-03 NOTE — Addendum Note (Signed)
Addended by: [place], Jaylan Hinojosa E on: 11/03/2017 12:14 PM   Modules accepted: Orders

## 2017-11-03 NOTE — Telephone Encounter (Signed)
Will not be refilling or rxing psych meds Understand this is a difficult time but he needs to follow through with psychiatric care

## 2017-11-03 NOTE — Telephone Encounter (Signed)
Patient informed. 

## 2017-11-03 NOTE — Telephone Encounter (Signed)
I reached [name] and he wants Dr Carlean Purl to refill his xanax. I told him we could not do that but he wants me to ask Dr Carlean Purl again. Lexapro he stopped on his own because it was making him sleepy and feel worse. He is taking amitripyline 100mg  one qhs to help him sleep. I told him I see he has an appointment with the [facility] tomorrow. He said he doesn't think he is going back there because they want to change all his medicines. He booked a follow up appointment with Dr Carlean Purl for his cirrhosis.

## 2017-11-09 ENCOUNTER — Other Ambulatory Visit: Payer: Self-pay | Admitting: Internal Medicine

## 2017-11-09 NOTE — Telephone Encounter (Signed)
Please advise , looks like another Dr stopped it.

## 2017-11-10 ENCOUNTER — Other Ambulatory Visit: Payer: Self-pay | Admitting: Internal Medicine

## 2017-11-10 NOTE — Telephone Encounter (Signed)
It looks like at dc he was now on 30 ml tid  Did he get that filled?  If he needs a refill we should do it for the dose at dc from behavioral health - assuming he is clear and without confusion

## 2017-11-10 NOTE — Telephone Encounter (Signed)
I spoke to [name] and he has plenty of his Lactulose so this must have been an automatic refill request. He is on 30mg  TID. He has an appointment with [place] next week. He sounded very clear, he knew who I was by my voice. I denied the refill request from the pharmacy for the Lactulose.

## 2017-11-11 ENCOUNTER — Telehealth: Payer: Self-pay | Admitting: Internal Medicine

## 2017-11-11 MED ORDER — LACTULOSE 10 GM/15ML PO SOLN: 20.0000 g | Freq: Three times a day (TID) | ORAL | 0 refills | Status: DC

## 2017-11-11 NOTE — Telephone Encounter (Signed)
I spoke with [name] and his brother [name] about what refills he needed. They don't need the Potassium, they have refills on that. He just needs his Lactolose. I confirmed the dosage with his brother. Dr Carlean Purl had approved this refill on 11/10/17 if he needed it. I will send it in now. We r/s'ed his appointment from 11/15/17 to 11/23/17 because of transportation.

## 2017-11-14 ENCOUNTER — Telehealth: Payer: Self-pay | Admitting: Internal Medicine

## 2017-11-14 NOTE — Telephone Encounter (Signed)
Patient is requesting "antianxiety" medicine. I told him per Dr Carlean Purl we are unable to prescribe that for him. I told him to call his PCP. He has an appointment with Dr Carlean Purl on 11/23/17 for follow up of his cirrhosis.

## 2017-11-15 ENCOUNTER — Other Ambulatory Visit: Payer: Self-pay

## 2017-11-15 ENCOUNTER — Ambulatory Visit: Payer: Self-pay | Admitting: Internal Medicine

## 2017-11-15 MED ORDER — LACTULOSE 10 GM/15ML PO SOLN: 20.0000 g | Freq: Three times a day (TID) | ORAL | 0 refills | Status: DC

## 2017-11-15 NOTE — Telephone Encounter (Signed)
Chronulac refilled as requested. The last refill was only enough to last a few days. He will see Dr Carlean Purl next week.

## 2017-11-21 ENCOUNTER — Other Ambulatory Visit: Payer: Medicaid Other

## 2017-11-21 DIAGNOSIS — K7290 Hepatic failure, unspecified without coma: Secondary | ICD-10-CM

## 2017-11-21 DIAGNOSIS — B20 Human immunodeficiency virus [HIV] disease: Secondary | ICD-10-CM

## 2017-11-21 DIAGNOSIS — Z113 Encounter for screening for infections with a predominantly sexual mode of transmission: Secondary | ICD-10-CM

## 2017-11-21 DIAGNOSIS — K7682 Hepatic encephalopathy: Secondary | ICD-10-CM

## 2017-11-21 DIAGNOSIS — K652 Spontaneous bacterial peritonitis: Secondary | ICD-10-CM

## 2017-11-21 DIAGNOSIS — K7030 Alcoholic cirrhosis of liver without ascites: Secondary | ICD-10-CM

## 2017-11-21 DIAGNOSIS — Z79899 Other long term (current) drug therapy: Secondary | ICD-10-CM

## 2017-11-22 LAB — CBC WITH DIFFERENTIAL/PLATELET
BASOS ABS: 31 {cells}/uL (ref 0–200)
Basophils Relative: 0.7 %
EOS PCT: 3.2 %
Eosinophils Absolute: 141 cells/uL (ref 15–500)
HCT: 37.1 % — ABNORMAL LOW (ref 38.5–50.0)
Hemoglobin: 12.2 g/dL — ABNORMAL LOW (ref 13.2–17.1)
Lymphs Abs: 876 cells/uL (ref 850–3900)
MCH: 26.2 pg — ABNORMAL LOW (ref 27.0–33.0)
MCHC: 32.9 g/dL (ref 32.0–36.0)
MCV: 79.6 fL — ABNORMAL LOW (ref 80.0–100.0)
MPV: 12.5 fL (ref 7.5–12.5)
Monocytes Relative: 12.4 %
NEUTROS ABS: 2807 {cells}/uL (ref 1500–7800)
Neutrophils Relative %: 63.8 %
Platelets: 111 10*3/uL — ABNORMAL LOW (ref 140–400)
RBC: 4.66 10*6/uL (ref 4.20–5.80)
RDW: 15.2 % — ABNORMAL HIGH (ref 11.0–15.0)
Total Lymphocyte: 19.9 %
WBC mixed population: 546 cells/uL (ref 200–950)
WBC: 4.4 10*3/uL (ref 3.8–10.8)

## 2017-11-22 LAB — RPR: RPR Ser Ql: NONREACTIVE

## 2017-11-22 LAB — LIPID PANEL
CHOL/HDL RATIO: 2.1 (calc) (ref <5.0)
Cholesterol: 127 mg/dL (ref <200)
HDL: 61 mg/dL (ref >40)
LDL Cholesterol (Calc): 52 mg/dL (calc)
Non-HDL Cholesterol (Calc): 66 mg/dL (calc) (ref <130)
TRIGLYCERIDES: 63 mg/dL (ref <150)

## 2017-11-23 ENCOUNTER — Other Ambulatory Visit (INDEPENDENT_AMBULATORY_CARE_PROVIDER_SITE_OTHER): Payer: Medicaid Other

## 2017-11-23 ENCOUNTER — Ambulatory Visit (INDEPENDENT_AMBULATORY_CARE_PROVIDER_SITE_OTHER): Payer: Medicaid Other | Admitting: Internal Medicine

## 2017-11-23 ENCOUNTER — Ambulatory Visit: Payer: Medicaid Other | Admitting: Infectious Disease

## 2017-11-23 ENCOUNTER — Encounter: Payer: Self-pay | Admitting: Internal Medicine

## 2017-11-23 VITALS — BP 142/90 | HR 92 | Ht 71.0 in | Wt 315.6 lb

## 2017-11-23 DIAGNOSIS — F3341 Major depressive disorder, recurrent, in partial remission: Secondary | ICD-10-CM | POA: Diagnosis not present

## 2017-11-23 DIAGNOSIS — K7682 Hepatic encephalopathy: Secondary | ICD-10-CM

## 2017-11-23 DIAGNOSIS — K7030 Alcoholic cirrhosis of liver without ascites: Secondary | ICD-10-CM

## 2017-11-23 DIAGNOSIS — I8510 Secondary esophageal varices without bleeding: Secondary | ICD-10-CM | POA: Diagnosis not present

## 2017-11-23 DIAGNOSIS — K7290 Hepatic failure, unspecified without coma: Secondary | ICD-10-CM | POA: Diagnosis not present

## 2017-11-23 DIAGNOSIS — F04 Amnestic disorder due to known physiological condition: Secondary | ICD-10-CM | POA: Diagnosis not present

## 2017-11-23 LAB — HIV-1 RNA QUANT-NO REFLEX-BLD
HIV 1 RNA Quant: <20 copies/mL
HIV-1 RNA Quant, Log: <1.3 Log copies/mL

## 2017-11-23 LAB — T-HELPER CELL (CD4) - (RCID CLINIC ONLY)
CD4 % Helper T Cell: 25 % — ABNORMAL LOW (ref 33–55)
CD4 T CELL ABS: 200 /uL — AB (ref 400–2700)

## 2017-11-23 NOTE — Progress Notes (Signed)
[name] 52 y.o. 08/03/1965 [id number]  Assessment & Plan:   Alcoholic cirrhosis of liver (HCC) Disease seems stable Situational stressors with mom's death led to relapse of alcohol use per psych notes. Labs ok - needs CBC and AFP and [place].  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 Had successful admit and is off alprazolam and receiving outpatient mental health care. He had episodic confusion and disorientation and was given dx of Wernicke-Korsakoff Syndrome. DXC on hydroxyzine, thiamine, Lexapro and trazodone prn sleep  He reports he feels well w/o edema or confusion at this time. Not drinking.  Has ID f/u next week  Wt Readings from Last 3 Encounters:  11/23/17 (!) 315 lb 9.6 oz (143.2 kg)  10/05/17 (!) 326 lb (147.9 kg)  10/05/17 (!) 326 lb 8 oz (148.1 kg)   Labs 6/10 CBC  Hgb 12. 2 MCV 79 PLT 111 WBC 4.2   Allergies  Allergen Reactions  . Penicillins     REACTION: hives Has patient had a PCN reaction causing immediate rash, facial/tongue/throat swelling, SOB or lightheadedness with hypotension: Yes Has patient had a PCN reaction causing severe rash involving mucus membranes or skin necrosis: Yes Has patient had a PCN reaction that required hospitalization No Has patient had a PCN reaction occurring within the last 10 years: Yes If all of the above answers are "NO", then may proceed  with Cephalosporin use./Per pt makes him feel weird!   . Sulfa Antibiotics     Pt states he did not have a reaction last time to Sulfa!   Current Meds  Medication Sig  . aMILoride (MIDAMOR) 5 MG tablet Take 3 tablets (15 mg total) by mouth daily. For high blood pressure  . amitriptyline (ELAVIL) 100 MG tablet Take 100 mg by mouth at bedtime.  . benzocaine (ORAJEL) 10 % mucosal gel Use as directed in the mouth or throat 2 (two) times daily as needed for mouth pain.  Marland Kitchen CIALIS 10 MG tablet TK 1 T PO  BEFORE SEXUAL ACTIVITY  . diclofenac sodium (VOLTAREN) 1 % GEL Apply 2 g topically 4 (four) times daily.  . dolutegravir (TIVICAY) 50 MG tablet Take 1 tablet (50 mg total) by mouth daily. For HIV infection.  Marland Kitchen emtricitabine-tenofovir AF (DESCOVY) 200-25 MG tablet Take 1 tablet by mouth daily. For HIV infection  . furosemide (LASIX) 40 MG tablet Take 1 tablet (40 mg total) by mouth daily. For swellings  . hydrOXYzine (ATARAX/VISTARIL) 50 MG tablet Take 1 tablet (50 mg total) by mouth every 6 (six) hours as needed (mild/moderate anxiety).  Marland Kitchen lactulose (CHRONULAC) 10 GM/15ML solution Take 30 mLs (20 g total) by mouth 3 (three) times daily. To rid ammonia build-up  . lidocaine (LIDODERM) 5 % Place 1 patch onto the skin daily. Remove & Discard patch within 12 hours or as directed by MD: For pain management  . NEXIUM 40 MG capsule TK 1 C PO QD B  BRE  . nicotine polacrilex (NICORETTE) 2 MG gum Take 1 each (2 mg total) by mouth as needed for smoking cessation. (may purchase from over the counter): For smoking cessation  . potassium chloride SA (K-DUR,KLOR-CON) 20 MEQ tablet Take 2 tablets (40 mEq total) by mouth 2 (two) times daily. For low potassium replacement  . simethicone (GAS-X) 80 MG chewable tablet Chew 1 tablet (80 mg total) by mouth 3 (three) times daily as needed (for flatulence).  . thiamine 100 MG tablet Take 1 tablet (100 mg total) by mouth daily. For thiamine replacement  . traZODone (DESYREL)  100 MG tablet Take 1 tablet (100 mg total) by mouth at bedtime as needed for sleep. For sleep  . ZOVIRAX 5 % APPLY A SUFFICENT AMOUNT TO AFFECTED AREA DAILY FOR 5 DAYS   Past Medical History:  Diagnosis Date  . Alcoholic cirrhosis of liver ([place])   . Alcoholism, chronic ([place])   . ANXIETY 06/23/2006  . Ascites 11/13/2009  . Ascites   . Avulsion fracture of middle phalanges of  3rd/4th fingers left hand 01/07/2014  . Blood dyscrasia    HIV  . Cellulitis of right lower extremity 03/02/2016  . DEPRESSION 06/23/2006  . 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 Erectile dysfunction 07/14/2015  . 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 Gastric ulcer 06/2010  . GERD (gastroesophageal reflux disease)   . HIV DISEASE 06/23/2006  . HYPERLIPIDEMIA 06/23/2006  . HYPERTENSION 06/23/2006   denies  . 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 Intentional benzodiazepine overdose ([place]) 10/2017  . Iron deficiency anemia   . Left foot infection   . Obesity (BMI 30-39.9)    BMI 34 kg/m^2  . Portal hypertensive gastropathy ([place]) 01/02/2013  . Recent shoulder injury    left shoulder/fell in parking lot/ no surgery/December 26, 2016  . SBP (spontaneous bacterial peritonitis) ([place]) 05/03/2011   Suspected by high leukocytes on paracentesis. Clinical scenario also compatible. November 2012 responded to Levaquin. Started on trimethoprim-sulfamethoxazole double strength daily for prophylaxis.   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 Skin cancer    left calf  . STRAIN, CHEST WALL 03/15/2007  . Suicidal ideation   . Varices, esophageal ([place]) 06/2010  . Venous stasis 03/02/2016  . 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 CARPAL TUNNEL RELEASE     left  . COLONOSCOPY  march 2013  . ESOPHAGEAL BANDING  05/01/2013   Procedure: ESOPHAGEAL BANDING;  Surgeon: Gatha Mayer, MD;  Location: [facility];  Service: Endoscopy;;  .  ESOPHAGOGASTRODUODENOSCOPY  07/01/2010;  08/12/10   small varices, gastric ulcer  . ESOPHAGOGASTRODUODENOSCOPY  10/26/2011   Procedure: ESOPHAGOGASTRODUODENOSCOPY (EGD);  Surgeon: Gatha Mayer, MD;  Location: [facility];  Service: Endoscopy;  Laterality: N/A;  . ESOPHAGOGASTRODUODENOSCOPY N/A 01/02/2013   Procedure: ESOPHAGOGASTRODUODENOSCOPY (EGD);  Surgeon: Gatha Mayer, MD;  Location: [facility];  Service: Endoscopy;  Laterality: N/A;  . ESOPHAGOGASTRODUODENOSCOPY N/A 04/23/2013   Procedure: ESOPHAGOGASTRODUODENOSCOPY (EGD);  Surgeon: Gatha Mayer, MD;  Location: [facility];  Service: Endoscopy;  Laterality: N/A;  . ESOPHAGOGASTRODUODENOSCOPY N/A 05/01/2013   Procedure: ESOPHAGOGASTRODUODENOSCOPY (EGD);  Surgeon: Gatha Mayer, MD;  Location: [facility];  Service: Endoscopy;  Laterality: N/A;  follow-up varices and possibly band them  . ESOPHAGOGASTRODUODENOSCOPY N/A 09/04/2013   Procedure: ESOPHAGOGASTRODUODENOSCOPY (EGD);  Surgeon: Gatha Mayer, MD;  Location: [facility];  Service: Endoscopy;  Laterality: N/A;  . ESOPHAGOGASTRODUODENOSCOPY N/A 09/10/2014   Procedure: ESOPHAGOGASTRODUODENOSCOPY (EGD);  Surgeon:  Gatha Mayer, MD;  Location: [facility];  Service: Endoscopy;  Laterality: N/A;  . ESOPHAGOGASTRODUODENOSCOPY (EGD) WITH PROPOFOL N/A 08/21/2015   Procedure: ESOPHAGOGASTRODUODENOSCOPY (EGD) WITH PROPOFOL;  Surgeon: Gatha Mayer, MD;  Location: [facility];  Service: Endoscopy;  Laterality: N/A;  . ESOPHAGOGASTRODUODENOSCOPY (EGD) WITH PROPOFOL N/A 04/06/2017   Procedure: ESOPHAGOGASTRODUODENOSCOPY (EGD) WITH PROPOFOL;  Surgeon: Gatha Mayer, MD;  Location: [facility];  Service: Endoscopy;  Laterality: N/A;  . ESOPHAGOGASTRODUODENOSCOPY W/ BANDING  06/26/2010   variceal ligation  . GASTRIC VARICES BANDING N/A 01/02/2013   Procedure: GASTRIC VARICES BANDING;  Surgeon: Gatha Mayer, MD;  Location: [facility];  Service: Endoscopy;  Laterality: N/A;  possible  banding  . UPPER GASTROINTESTINAL ENDOSCOPY     Social History   Social History Narrative   Single, disabled hair stylist   Lives with parents in a home with a basement.  Parents do not like for him to go down stairs because they are steep.           family history includes Alcohol abuse in [name] other; Breast cancer in [name] maternal aunt and mother; Drug abuse in [name] brother; Heart disease in [name] brother, maternal uncle, and mother; Hyperlipidemia in [name] mother; Hypertension in [name] father and mother; Irritable bowel syndrome in [name] father and paternal aunt.   Review of Systems As per HPI  Objective:   Physical Exam BP (!) 142/90   Pulse 92   Ht 5\' 11"  (1.803 m)   Wt (!) 315 lb 9.6 oz (143.2 kg)   SpO2 96%   BMI 44.02 kg/m  Obese wm nad Speech is clear mood appropriate affect NL No asterixis and alert and oriented x 3 Eyes anicteric Lungs cta cor s1s2 no rmg abd obese soft NT 'ext no c/c/e

## 2017-11-23 NOTE — Patient Instructions (Signed)
Your provider has requested that you go to the basement level for lab work before leaving today. Press "B" on the elevator. The lab is located at the first door on the left as you exit the elevator.  You have been scheduled for an abdominal ultrasound at [facility] (1st floor of hospital) on 12/06/17 at 12:30pm. Please arrive 15 minutes prior to your appointment for registration. Make certain not to have anything to eat or drink 6 hours prior to your appointment. Should you need to reschedule your appointment, please contact radiology at [phone]. This test typically takes about 30 minutes to perform.   Follow up with Dr Carlean Purl in 6 months.     I appreciate the opportunity to care for you. Silvano Rusk, MD, [facility]

## 2017-11-24 LAB — COMPREHENSIVE METABOLIC PANEL
ALT: 20 U/L (ref 0–53)
AST: 24 U/L (ref 0–37)
Albumin: 4 g/dL (ref 3.5–5.2)
Alkaline Phosphatase: 107 U/L (ref 39–117)
BUN: 10 mg/dL (ref 6–23)
CHLORIDE: 106 meq/L (ref 96–112)
CO2: 24 mEq/L (ref 19–32)
Calcium: 9.6 mg/dL (ref 8.4–10.5)
Creatinine, Ser: 0.93 mg/dL (ref 0.40–1.50)
GFR: 90.32 mL/min (ref >60.00)
GLUCOSE: 117 mg/dL — AB (ref 70–99)
POTASSIUM: 3.9 meq/L (ref 3.5–5.1)
Sodium: 137 mEq/L (ref 135–145)
Total Bilirubin: 0.8 mg/dL (ref 0.2–1.2)
Total Protein: 6.8 g/dL (ref 6.0–8.3)

## 2017-11-24 LAB — AFP TUMOR MARKER: AFP-Tumor Marker: 1.9 ng/mL (ref <6.1)

## 2017-11-26 ENCOUNTER — Encounter: Payer: Self-pay | Admitting: Internal Medicine

## 2017-11-26 NOTE — Assessment & Plan Note (Signed)
Due for surveillance 03/2018

## 2017-11-26 NOTE — Assessment & Plan Note (Signed)
Improved

## 2017-11-26 NOTE — Assessment & Plan Note (Signed)
Under control 

## 2017-11-26 NOTE — Assessment & Plan Note (Addendum)
Disease seems stable Situational stressors with mom's death led to relapse of alcohol use per psych notes. Labs ok - needs CMET and AFP and [place].

## 2017-11-26 NOTE — Assessment & Plan Note (Signed)
Seems possible but does not seem to have residual issues Appropriate to stay on thiamine

## 2017-11-27 NOTE — Progress Notes (Signed)
Labs ok My Chart message

## 2017-11-29 ENCOUNTER — Telehealth: Payer: Self-pay | Admitting: Internal Medicine

## 2017-11-29 NOTE — Telephone Encounter (Signed)
[name] needs his last office visit and labs faxed to his primary.  I sent them at his request.

## 2017-12-06 ENCOUNTER — Ambulatory Visit (INDEPENDENT_AMBULATORY_CARE_PROVIDER_SITE_OTHER): Payer: Medicaid Other | Admitting: Infectious Disease

## 2017-12-06 ENCOUNTER — Ambulatory Visit (HOSPITAL_COMMUNITY): Admission: RE | Admit: 2017-12-06 | Payer: Medicaid Other | Source: Ambulatory Visit

## 2017-12-06 ENCOUNTER — Telehealth: Payer: Self-pay

## 2017-12-06 ENCOUNTER — Encounter: Payer: Self-pay | Admitting: Infectious Disease

## 2017-12-06 VITALS — BP 112/74 | HR 73 | Ht 72.0 in | Wt 320.0 lb

## 2017-12-06 DIAGNOSIS — F04 Amnestic disorder due to known physiological condition: Secondary | ICD-10-CM | POA: Diagnosis not present

## 2017-12-06 DIAGNOSIS — F332 Major depressive disorder, recurrent severe without psychotic features: Secondary | ICD-10-CM

## 2017-12-06 DIAGNOSIS — K7290 Hepatic failure, unspecified without coma: Secondary | ICD-10-CM | POA: Diagnosis not present

## 2017-12-06 DIAGNOSIS — F1020 Alcohol dependence, uncomplicated: Secondary | ICD-10-CM

## 2017-12-06 DIAGNOSIS — B20 Human immunodeficiency virus [HIV] disease: Secondary | ICD-10-CM

## 2017-12-06 DIAGNOSIS — K7682 Hepatic encephalopathy: Secondary | ICD-10-CM

## 2017-12-06 NOTE — Telephone Encounter (Signed)
Did a prior re- authorization for patient's Nexium 40mg  capsules. Patient has tried Pantoprazole, omeprazole, and pepcid. It has been approve for a year, approval # [id number]. [facility]  In [employer] informed.
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
## 2017-12-12 MED FILL — TIVICAY 50 MG TABLET: 50 | 30 days supply | Qty: 30 | Fill #3

## 2017-12-12 MED FILL — DESCOVY 200-25 MG TABS: 200-25 | 30 days supply | Qty: 30 | Fill #3

## 2017-12-19 ENCOUNTER — Telehealth: Payer: Self-pay | Admitting: Internal Medicine

## 2017-12-19 NOTE — Telephone Encounter (Signed)
I will not authorize this at present. Si can make an appointment to discuss but his recent use of alcohol and lack of a social support system preclude transplant and actually he is stable and does not need one at present - doubt he would be listed

## 2017-12-19 NOTE — Telephone Encounter (Signed)
Is this ok to authorize referral?

## 2017-12-20 ENCOUNTER — Telehealth: Payer: Self-pay | Admitting: *Deleted

## 2017-12-20 NOTE — Telephone Encounter (Signed)
-----   Message from [place] sent at 12/20/2017  1:47 PM EDT ----- Contact: [phone] Patient has a question about his liver

## 2017-12-20 NOTE — Telephone Encounter (Signed)
Called patient back, he has questions about why he might not be able to get a liver transplant. RN encouraged patient to follow up, discuss this with Dr Carlean Purl. Landis Gandy, RN

## 2017-12-20 NOTE — Telephone Encounter (Signed)
I left a detailed message for [name] with Dr. Celesta Aver response

## 2017-12-20 NOTE — Telephone Encounter (Signed)
Patient called upset that Dr. Julio Sicks will not authorize his referral.  Patient is advised that he will need to come in and talk with Dr. Carlean Purl.  Patient is very upset threatening to bring a lawyer to the appt because Dr. Carlean Purl is discriminating against him and that he will seek a [employer] lawyer. [name] is advised that he should bring any correspondance with him to the appt and that he should come speak with Dr. Carlean Purl with an open mind and not be antagonistic nor threaten a lawyer. Dr. Carlean Purl simply wants to discuss with him the program and then will decide if he will authorize referral.

## 2017-12-23 ENCOUNTER — Encounter: Payer: Self-pay | Admitting: Internal Medicine

## 2017-12-23 ENCOUNTER — Ambulatory Visit (INDEPENDENT_AMBULATORY_CARE_PROVIDER_SITE_OTHER): Payer: Medicaid Other | Admitting: Internal Medicine

## 2017-12-23 VITALS — BP 140/74 | HR 80 | Ht 70.75 in | Wt 331.5 lb

## 2017-12-23 DIAGNOSIS — F04 Amnestic disorder due to known physiological condition: Secondary | ICD-10-CM | POA: Diagnosis not present

## 2017-12-23 DIAGNOSIS — K7031 Alcoholic cirrhosis of liver with ascites: Secondary | ICD-10-CM | POA: Diagnosis not present

## 2017-12-23 NOTE — Patient Instructions (Signed)
Dr Carlean Purl will work on getting your information to [facility] for you.  I appreciate the opportunity to care for you. Silvano Rusk, MD, [facility]

## 2017-12-23 NOTE — Assessment & Plan Note (Signed)
Will communicate with [facility]

## 2017-12-23 NOTE — Progress Notes (Signed)
[name] 52 y.o. 21-May-1966 [id number]  Assessment & Plan:   Alcoholic cirrhosis of liver [facility]) Will communicate with [facility]  Encounter Diagnoses  Name Primary?  . Alcoholic cirrhosis of liver with ascites (HCC) Yes  . Korsakoff syndrome ?      I did write a letter supporting his evaluation at [place] had concerns about recent psych issues and drinking and social situation. He assured me that he had spoken to and had been to [facility] and was ready to move forward. We have subsequently communicated with [facility] and they were not aware of alcohol and psych issues and have told him he needs 6 mos of alcohol counselling before he can be considered.  He was previously turned down for transplant at [facility] due to lack of social support.  Our communication with [facility] has confirmed my initial thoughts that [name] will not be a candidate for liver transplant at all.  At his psychiatric admission in the spring he was given a dx of Wernicke-Korsakoff Syndrome ? If he had/has this but seems possible - I wonder if he is not confabulating some  Need to consider neurologic evaluation  [id number], Carl Hauser, MD Hollie Salk, MD  Subjective:   Chief Complaint: Wants referral for liver transplant  HPI [name] is here today because he wants to discuss going to [facility] for a living donor liver transplant.  Apparently Medicaid has approved this, and he has a donor.  He needs a letter or some sort of information from me.  He is otherwise stable. Allergies  Allergen Reactions  . Penicillins     REACTION: hives Has patient had a PCN reaction causing immediate rash, facial/tongue/throat swelling, SOB or lightheadedness with hypotension: Yes Has patient had a PCN reaction causing severe rash involving mucus membranes or skin necrosis: Yes Has patient had a PCN reaction that required hospitalization No Has patient had a PCN reaction occurring within the last 10 years: Yes If all of the above answers are  "NO", then may proceed with Cephalosporin use./Per pt makes him feel weird!   . Sulfa Antibiotics     Pt states he did not have a reaction last time to Sulfa!   Current Meds  Medication Sig  . aMILoride (MIDAMOR) 5 MG tablet Take 3 tablets (15 mg total) by mouth daily. For high blood pressure  . amitriptyline (ELAVIL) 100 MG tablet Take 100 mg by mouth at bedtime.  . benzocaine (ORAJEL) 10 % mucosal gel Use as directed in the mouth or throat 2 (two) times daily as needed for mouth pain.  Marland Kitchen CIALIS 10 MG tablet TK 1 T PO  BEFORE SEXUAL ACTIVITY  . diclofenac sodium (VOLTAREN) 1 % GEL Apply 2 g topically 4 (four) times daily.  . dolutegravir (TIVICAY) 50 MG tablet Take 1 tablet (50 mg total) by mouth daily. For HIV infection.  Marland Kitchen emtricitabine-tenofovir AF (DESCOVY) 200-25 MG tablet Take 1 tablet by mouth daily. For HIV infection  . furosemide (LASIX) 40 MG tablet Take 1 tablet (40 mg total) by mouth daily. For swellings  . hydrOXYzine (ATARAX/VISTARIL) 50 MG tablet Take 1 tablet (50 mg total) by mouth every 6 (six) hours as needed (mild/moderate anxiety).  Marland Kitchen lactulose (CHRONULAC) 10 GM/15ML solution Take 30 mLs (20 g total) by mouth 3 (three) times daily. To rid ammonia build-up  . lidocaine (LIDODERM) 5 % Place 1 patch onto the skin daily. Remove & Discard patch within 12 hours or as directed by MD: For pain  management  . methocarbamol (ROBAXIN) 750 MG tablet Take 2 tablets by mouth 4 (four) times daily as needed.  Marland Kitchen NEXIUM 40 MG capsule TK 1 C PO QD B BRE  . nicotine polacrilex (NICORETTE) 2 MG gum Take 1 each (2 mg total) by mouth as needed for smoking cessation. (may purchase from over the counter): For smoking cessation  . potassium chloride SA (K-DUR,KLOR-CON) 20 MEQ tablet Take 2 tablets (40 mEq total) by mouth 2 (two) times daily. For low potassium replacement  . PROAIR HFA 108 (90 Base) MCG/ACT inhaler Inhale 1-2 puffs into the lungs every 4 (four) hours as needed.  . simethicone (GAS-X)  80 MG chewable tablet Chew 1 tablet (80 mg total) by mouth 3 (three) times daily as needed (for flatulence).  . thiamine 100 MG tablet Take 1 tablet (100 mg total) by mouth daily. For thiamine replacement  . traZODone (DESYREL) 100 MG tablet Take 1 tablet (100 mg total) by mouth at bedtime as needed for sleep. For sleep  . ZOVIRAX 5 % APPLY A SUFFICENT AMOUNT TO AFFECTED AREA DAILY FOR 5 DAYS   Past Medical History:  Diagnosis Date  . Alcoholic cirrhosis of liver ([place])   . Alcoholism, chronic ([place])   . ANXIETY 06/23/2006  . Ascites 11/13/2009  . Ascites   . Avulsion fracture of middle phalanges of  3rd/4th fingers left hand 01/07/2014  . Blood dyscrasia    HIV  . Cellulitis of right lower extremity 03/02/2016  . DEPRESSION 06/23/2006  . 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 Erectile dysfunction 07/14/2015  . 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 Gastric ulcer 06/2010  . GERD (gastroesophageal reflux disease)   . HIV DISEASE 06/23/2006  . HYPERLIPIDEMIA 06/23/2006  . HYPERTENSION 06/23/2006   denies  . 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 Intentional benzodiazepine overdose ([place]) 10/2017  . Iron deficiency anemia   . Left foot infection   . Obesity (BMI 30-39.9)    BMI 34 kg/m^2  . Portal hypertensive gastropathy ([place]) 01/02/2013  . Recent shoulder injury    left shoulder/fell in parking lot/ no surgery/December 26, 2016  . SBP (spontaneous bacterial peritonitis) ([place]) 05/03/2011   Suspected by high leukocytes on paracentesis. Clinical scenario also compatible. November 2012 responded to Levaquin. Started on trimethoprim-sulfamethoxazole double strength daily for prophylaxis.   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 Skin cancer    left calf  . STRAIN, CHEST WALL 03/15/2007  . Suicidal ideation   . 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 Venous stasis 03/02/2016  . 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  CARPAL TUNNEL RELEASE     left  . COLONOSCOPY  march 2013  . ESOPHAGEAL BANDING  05/01/2013   Procedure: ESOPHAGEAL BANDING;  Surgeon: Gatha Mayer, MD;  Location: [facility] ENDOSCOPY;  Service: Endoscopy;;  . ESOPHAGOGASTRODUODENOSCOPY  07/01/2010;  08/12/10   small varices, gastric ulcer  . ESOPHAGOGASTRODUODENOSCOPY  10/26/2011   Procedure: ESOPHAGOGASTRODUODENOSCOPY (EGD);  Surgeon: Gatha Mayer, MD;  Location: [facility];  Service: Endoscopy;  Laterality: N/A;  . ESOPHAGOGASTRODUODENOSCOPY N/A 01/02/2013   Procedure: ESOPHAGOGASTRODUODENOSCOPY (EGD);  Surgeon: Gatha Mayer, MD;  Location: [facility];  Service: Endoscopy;  Laterality: N/A;  . ESOPHAGOGASTRODUODENOSCOPY N/A 04/23/2013   Procedure: ESOPHAGOGASTRODUODENOSCOPY (EGD);  Surgeon: Gatha Mayer, MD;  Location: [facility];  Service: Endoscopy;  Laterality: N/A;  . ESOPHAGOGASTRODUODENOSCOPY N/A 05/01/2013   Procedure: ESOPHAGOGASTRODUODENOSCOPY (EGD);  Surgeon: Gatha Mayer, MD;  Location: [facility]  ENDOSCOPY;  Service: Endoscopy;  Laterality: N/A;  follow-up varices and possibly band them  . ESOPHAGOGASTRODUODENOSCOPY N/A 09/04/2013   Procedure: ESOPHAGOGASTRODUODENOSCOPY (EGD);  Surgeon: Gatha Mayer, MD;  Location: [facility];  Service: Endoscopy;  Laterality: N/A;  . ESOPHAGOGASTRODUODENOSCOPY N/A 09/10/2014   Procedure: ESOPHAGOGASTRODUODENOSCOPY (EGD);  Surgeon: Gatha Mayer, MD;  Location: [facility];  Service: Endoscopy;  Laterality: N/A;  . ESOPHAGOGASTRODUODENOSCOPY (EGD) WITH PROPOFOL N/A 08/21/2015   Procedure: ESOPHAGOGASTRODUODENOSCOPY (EGD) WITH PROPOFOL;  Surgeon: Gatha Mayer, MD;  Location: [facility] END[facility];  Service: Endoscopy;  Laterality: N/A;  . ESOPHAGOGASTRODUODENOSCOPY (EGD) WITH PROPOFOL N/A 04/06/2017   Procedure: ESOPHAGOGASTRODUODENOSCOPY (EGD) WITH PROPOFOL;  Surgeon: Gatha Mayer, MD;  Location: [facility] ENDOS[facility];  Service: Endoscopy;  Laterality: N/A;  . ESOPHAGOGASTRODUODENOSCOPY W/ BANDING  06/26/2010     variceal ligation  . GASTRIC VARICES BANDING N/A 01/02/2013   Procedure: GASTRIC VARICES BANDING;  Surgeon: Gatha Mayer, MD;  Location: [facility] ENDOSCOPY;  Service: Endoscopy;  Laterality: N/A;  possible banding  . UPPER GASTROINTESTINAL ENDOSCOPY     Social History   Social History Narrative   Single, disabled hair stylist   Lives with parents in a home with a basement.  Parents do not like for him to go down stairs because they are steep.           family history includes Alcohol abuse in his other; Breast cancer in his maternal aunt and mother; Drug abuse in his brother; Heart disease in his brother, maternal uncle, and mother; Hyperlipidemia in his mother; Hypertension in his father and mother; Irritable bowel syndrome in his father and paternal aunt.   Review of Systems As per HPI  Objective:   Physical Exam BP 140/74 (BP Location: Left Wrist, Patient Position: Sitting, Cuff Size: Normal)   Pulse 80   Ht 5' 10.75" (1.797 m)   Wt (!) 331 lb 8 oz (150.4 kg)   BMI 46.56 kg/m  NAD  15 minutes time spent with patient > half in counseling coordination of care

## 2017-12-26 ENCOUNTER — Telehealth: Payer: Self-pay | Admitting: Internal Medicine

## 2017-12-26 NOTE — Telephone Encounter (Signed)
Have you had a chance to write referral letter?

## 2017-12-27 ENCOUNTER — Encounter: Payer: Self-pay | Admitting: Internal Medicine

## 2017-12-27 ENCOUNTER — Telehealth: Payer: Self-pay | Admitting: *Deleted

## 2017-12-27 NOTE — Telephone Encounter (Signed)
Missy called back from  to report that in light of new information about patient's recent alcohol consumption they have told the patient that he will need to attend a documented drug and alcohol program for 6 months then will require clearance from GI, ID, and his primary care to continue in the program to be considered for transplant.  At this time he does not meet the criteria.

## 2017-12-27 NOTE — Telephone Encounter (Signed)
Dr. Carlean Purl has written letter.  According to Missy at [facility] is requiring a prior authorization for the patient to be considered for transplant.  I contacted [facility] [employer] at [phone] and was informed that referral must come from the patient's primary care.  I contacted Dr. Florina Ou office at [place] and I spoke with Estill Bamberg.  Dr. Burnett Sheng was not aware of the plans for transplant and at this time declines to make referral.  Patient must have clearance from ID then make an appt with Dr. Burnett Sheng to discuss.  Patient is notified of all of this. He verbalized understanding that the referral must come from his primary.    Dr. Celesta Aver letter and reference number [phone] from my call with Medicaid faxed to Dr. Stefan Church office at [phone]

## 2017-12-27 NOTE — Telephone Encounter (Signed)
Patient called and asked if we could fax records to his transplant doctor and possibly write a letter. Advised him as soon as he comes and signs a release we can send whatever he wants to his new doctors. He advised he will get here when he can.

## 2018-01-18 NOTE — Telephone Encounter (Signed)
Patient called again to have records sent to office in [place] to have procedure done. He also wants [place] to call him PCP office and have their notes sent here so that we can send them as well. Advised him we can not send notes from other offices and to have them fax notes directly. Advised him again he will need to come and sign a release of information to have before we can send. Also that there may be a charge as he is asking for the notes himself. He advised he understands and will come by no later than Friday to sing ROI.

## 2018-01-25 ENCOUNTER — Other Ambulatory Visit: Payer: Self-pay | Admitting: Internal Medicine

## 2018-01-25 NOTE — Telephone Encounter (Signed)
How many refills? 

## 2018-01-26 ENCOUNTER — Telehealth: Payer: Self-pay

## 2018-01-26 MED ORDER — LACTULOSE 10 GM/15ML PO SOLN: 20.0000 g | Freq: Three times a day (TID) | ORAL | 5 refills | Status: DC

## 2018-01-26 NOTE — Telephone Encounter (Signed)
Refilled as approved. 

## 2018-01-26 NOTE — Telephone Encounter (Signed)
Refill x 6 mos 

## 2018-01-26 NOTE — Telephone Encounter (Signed)
Pruitt's brother called and needs more lactulose sent in at a time. I called the pharmacy and gave it to them verbally for a larger amount.

## 2018-01-26 NOTE — Telephone Encounter (Signed)
Patient brother [name] states pt needs refill of medication lactulose sent to [facility] in [place].

## 2018-01-26 NOTE — Telephone Encounter (Signed)
Please advise [name], thank you. 

## 2018-01-31 ENCOUNTER — Other Ambulatory Visit: Payer: Self-pay | Admitting: *Deleted

## 2018-01-31 DIAGNOSIS — B20 Human immunodeficiency virus [HIV] disease: Secondary | ICD-10-CM

## 2018-01-31 MED ORDER — DOLUTEGRAVIR SODIUM 50 MG PO TABS: 50.0000 mg | ORAL_TABLET | Freq: Every day | ORAL | 3 refills | Status: DC

## 2018-01-31 MED ORDER — EMTRICITABINE-TENOFOVIR AF 200-25 MG PO TABS: 1.0000 | ORAL_TABLET | Freq: Every day | ORAL | 3 refills | Status: DC

## 2018-02-20 ENCOUNTER — Telehealth: Payer: Self-pay | Admitting: Internal Medicine

## 2018-02-20 ENCOUNTER — Telehealth: Payer: Self-pay | Admitting: Infectious Disease

## 2018-02-20 NOTE — Telephone Encounter (Signed)
Patient left message asking for nurse to call back.  RN attempted, received the message "Unable to complete call at this time" Landis Gandy, RN

## 2018-02-20 NOTE — Telephone Encounter (Signed)
Left message for patient to call back  

## 2018-02-21 NOTE — Telephone Encounter (Signed)
Left message for patient to call back  

## 2018-02-21 NOTE — Telephone Encounter (Signed)
Pt called asking to speak with PJ, I asked pt if call was regarding his call from yesterday and he said yes. Then, I told him that it was Dr. Celesta Aver nurse who needs to speak with him. He then said that he prefers to speak with Pj and to please have her call him.

## 2018-02-21 NOTE — Telephone Encounter (Signed)
Patient called today to follow up release of information form he filled out to be sent to doctors office in [place].  Patient states he is in the process of trying to get a Liver transplant and need the records faxed to get the process started.  Informed him the records were processed 02/15/2018.  Patient verbalized understanding. Pricilla Riffle RN

## 2018-02-22 NOTE — Telephone Encounter (Signed)
Patient needs his records sent to his insurance company. He is advised that he will need to go to [address] to sign a release.

## 2018-02-24 ENCOUNTER — Telehealth: Payer: Self-pay | Admitting: Internal Medicine

## 2018-02-24 NOTE — Telephone Encounter (Signed)
I spoke with Missy at the transplant center.  They are requesting a referral for [name].  I explained that [place] Medicaid requires referral come for the primary care, we are not able to make the referral. She will contact the patient's primary care.

## 2018-02-24 NOTE — Telephone Encounter (Signed)
Missy, transplant coordinator at the [facility] needs to speak with you regarding pt. Pls call her at [phone].

## 2018-03-01 ENCOUNTER — Telehealth: Payer: Self-pay | Admitting: Internal Medicine

## 2018-03-01 NOTE — Telephone Encounter (Signed)
Patient reports pain in his back.  He is gaining weight. Reports terrible pain in his back. Can't stand for more than 20 minutes due to the pain. He is asked to contact his primary care.  He does not have pain in his stomach. He will call back for GI concerns

## 2018-03-08 ENCOUNTER — Telehealth: Payer: Self-pay

## 2018-03-08 NOTE — Telephone Encounter (Signed)
Patient called today requesting a letter to be sent to [facility] in [place]. Patient states pain clinic is requesting a letter for clearance and stating that his HIV is being monitored by Dr. Tommy Medal. Patient would like letter faxed to [phone]. If any questions can call [facility] at [phone]. Patient would like a copy of letter as well. [place]

## 2018-03-22 ENCOUNTER — Telehealth: Payer: Self-pay | Admitting: Internal Medicine

## 2018-03-22 DIAGNOSIS — K7031 Alcoholic cirrhosis of liver with ascites: Secondary | ICD-10-CM

## 2018-03-22 NOTE — Telephone Encounter (Signed)
Pt called stating that he has gained significant weight due to a medication that he takes for HIV, he saw a dietitian last week, he wants to know Dr. Celesta Aver advise on this, basically he wants to know if Dr. Carlean Purl thinks that that is the best direction to take. Pt stated that his PCP does not want to give him any advise and he is looking for a new PCP. Pls call him.

## 2018-03-22 NOTE — Telephone Encounter (Signed)
That is a #20 weight gain  I would ? Ascites/edema  He needs a complete abd [place]   Dx Cirrhosis  He could die from weight loss surgery - at higher risk of complications and I would think his cirrhosis might preclude the surgery as well as needs psych eval usually also  We can ask about it BUT must know if he has ascites before I would consider even me asking the surgeons

## 2018-03-22 NOTE — Telephone Encounter (Signed)
Left message for patient to call back  

## 2018-03-22 NOTE — Telephone Encounter (Signed)
Patient hs gained a significant amount of weight.  He states he is almost 350 lbs.  He went to a nutritionist in [place] last week and is working on weight loss.  He would like to discuss weight loss surgery referral.  I discussed with him that we can't make a referral due to his Medicaid, but that [facility] requires that every patient visit one of their information sessions before they could be see.  I explained that he could go see what they have to say and they will explain the process and he can find out if he is a candidate from their guidelines. I let him know that he does not require a referral to go to an information session.  I provided him the information to sign up.  He is not happy with his primary care. Remo Lipps and I have discussed a few times that he should ask Medicaid to assign him another physician if he does not feel they have an effective patient/physician relationship. He is not happy at home living with his brother or his father and is looking to relocate.    Dr. Carlean Purl,  he would like to know your thoughts on weight loss advice.

## 2018-03-23 NOTE — Telephone Encounter (Signed)
[place] scheduled for 03/27/18 11:00 at [facility] Left message for patient to call back

## 2018-03-24 NOTE — Telephone Encounter (Signed)
Left message for patient to call back  

## 2018-03-27 ENCOUNTER — Ambulatory Visit (HOSPITAL_COMMUNITY): Payer: Medicaid Other

## 2018-03-27 NOTE — Telephone Encounter (Signed)
Patient notified of the recommendations Amed did not get the message about [place] that was scheduled for this am.   He will call [phone] and they will help him reschedule.

## 2018-03-27 NOTE — Telephone Encounter (Signed)
Left message for patient to call back  

## 2018-03-30 ENCOUNTER — Ambulatory Visit (HOSPITAL_COMMUNITY)
Admission: RE | Admit: 2018-03-30 | Discharge: 2018-03-30 | Disposition: A | Discharge status: Home / Self Care | Payer: Medicaid Other | Source: Ambulatory Visit | Attending: Internal Medicine | Admitting: Internal Medicine

## 2018-03-30 DIAGNOSIS — K7031 Alcoholic cirrhosis of liver with ascites: Secondary | ICD-10-CM | POA: Insufficient documentation

## 2018-03-30 DIAGNOSIS — R161 Splenomegaly, not elsewhere classified: Secondary | ICD-10-CM | POA: Insufficient documentation

## 2018-04-03 ENCOUNTER — Telehealth: Payer: Self-pay | Admitting: Internal Medicine

## 2018-04-03 NOTE — Telephone Encounter (Signed)
Patient wanting to know results from [place] on 10.17.19 and where to go from there.

## 2018-04-03 NOTE — Telephone Encounter (Signed)
US  Shows stable cirrhosis  No ascites to explain weight gain  I do not think the risks of bariatric surgery in his case would be acceptable and I doubt the surgeons would operate on him.  He should schedule a next available appt w/ me

## 2018-04-03 NOTE — Telephone Encounter (Signed)
Patient calling for [place] resuls

## 2018-04-03 NOTE — Telephone Encounter (Signed)
Left message for patient to call back  

## 2018-04-03 NOTE — Progress Notes (Signed)
See phone note No ascites

## 2018-04-04 NOTE — Telephone Encounter (Signed)
Patient notified of the results and recommendations Follow up scheduled for 05/16/18 11:00

## 2018-04-05 ENCOUNTER — Other Ambulatory Visit: Payer: Medicaid Other

## 2018-04-06 ENCOUNTER — Other Ambulatory Visit: Payer: Medicaid Other

## 2018-04-06 ENCOUNTER — Other Ambulatory Visit (HOSPITAL_COMMUNITY)
Admission: RE | Admit: 2018-04-06 | Discharge: 2018-04-06 | Disposition: A | Discharge status: Home / Self Care | Payer: Medicaid Other | Source: Ambulatory Visit | Attending: Infectious Disease | Admitting: Infectious Disease

## 2018-04-06 DIAGNOSIS — B20 Human immunodeficiency virus [HIV] disease: Secondary | ICD-10-CM | POA: Diagnosis not present

## 2018-04-07 LAB — T-HELPER CELL (CD4) - (RCID CLINIC ONLY)
CD4 % Helper T Cell: 24 % — ABNORMAL LOW (ref 33–55)
CD4 T Cell Abs: 160 /uL — ABNORMAL LOW (ref 400–2700)

## 2018-04-07 LAB — URINE CYTOLOGY ANCILLARY ONLY
Chlamydia: NEGATIVE
Neisseria Gonorrhea: NEGATIVE

## 2018-04-10 ENCOUNTER — Encounter: Payer: Self-pay | Admitting: Infectious Disease

## 2018-04-10 LAB — CBC WITH DIFFERENTIAL/PLATELET
BASOS ABS: 19 {cells}/uL (ref 0–200)
Basophils Relative: 0.6 %
EOS ABS: 122 {cells}/uL (ref 15–500)
Eosinophils Relative: 3.8 %
HCT: 36.8 % — ABNORMAL LOW (ref 38.5–50.0)
Hemoglobin: 12 g/dL — ABNORMAL LOW (ref 13.2–17.1)
Lymphs Abs: 621 cells/uL — ABNORMAL LOW (ref 850–3900)
MCH: 25.7 pg — ABNORMAL LOW (ref 27.0–33.0)
MCHC: 32.6 g/dL (ref 32.0–36.0)
MCV: 78.8 fL — AB (ref 80.0–100.0)
MONOS PCT: 8.6 %
MPV: 11.2 fL (ref 7.5–12.5)
NEUTROS ABS: 2163 {cells}/uL (ref 1500–7800)
NEUTROS PCT: 67.6 %
Platelets: 80 10*3/uL — ABNORMAL LOW (ref 140–400)
RBC: 4.67 10*6/uL (ref 4.20–5.80)
RDW: 15.6 % — ABNORMAL HIGH (ref 11.0–15.0)
TOTAL LYMPHOCYTE: 19.4 %
WBC mixed population: 275 cells/uL (ref 200–950)
WBC: 3.2 10*3/uL — ABNORMAL LOW (ref 3.8–10.8)

## 2018-04-10 LAB — COMPLETE METABOLIC PANEL WITH GFR
AG RATIO: 1.7 (calc) (ref 1.0–2.5)
ALT: 19 U/L (ref 9–46)
AST: 25 U/L (ref 10–35)
Albumin: 4.1 g/dL (ref 3.6–5.1)
Alkaline phosphatase (APISO): 95 U/L (ref 40–115)
BILIRUBIN TOTAL: 0.9 mg/dL (ref 0.2–1.2)
BUN: 10 mg/dL (ref 7–25)
CHLORIDE: 106 mmol/L (ref 98–110)
CO2: 20 mmol/L (ref 20–32)
Calcium: 9.3 mg/dL (ref 8.6–10.3)
Creat: 0.97 mg/dL (ref 0.70–1.33)
GFR, Est African American: 103 mL/min/{1.73_m2} (ref >=60)
GFR, Est Non African American: 89 mL/min/{1.73_m2} (ref >=60)
GLOBULIN: 2.4 g/dL (ref 1.9–3.7)
Glucose, Bld: 140 mg/dL — ABNORMAL HIGH (ref 65–99)
Potassium: 4 mmol/L (ref 3.5–5.3)
SODIUM: 136 mmol/L (ref 135–146)
Total Protein: 6.5 g/dL (ref 6.1–8.1)

## 2018-04-10 LAB — LIPID PANEL
Cholesterol: 136 mg/dL (ref <200)
HDL: 58 mg/dL (ref >40)
LDL CHOLESTEROL (CALC): 63 mg/dL
NON-HDL CHOLESTEROL (CALC): 78 mg/dL (ref <130)
TRIGLYCERIDES: 74 mg/dL (ref <150)
Total CHOL/HDL Ratio: 2.3 (calc) (ref <5.0)

## 2018-04-10 LAB — RPR: RPR: NONREACTIVE

## 2018-04-10 LAB — HIV-1 RNA QUANT-NO REFLEX-BLD
HIV 1 RNA QUANT: NOT DETECTED {copies}/mL
HIV-1 RNA QUANT, LOG: NOT DETECTED {Log_copies}/mL

## 2018-04-10 NOTE — Telephone Encounter (Signed)
I just signed form for this

## 2018-04-19 ENCOUNTER — Encounter: Payer: Self-pay | Admitting: Infectious Disease

## 2018-04-28 ENCOUNTER — Ambulatory Visit (INDEPENDENT_AMBULATORY_CARE_PROVIDER_SITE_OTHER): Payer: Medicaid Other | Admitting: Infectious Disease

## 2018-04-28 ENCOUNTER — Encounter: Payer: Self-pay | Admitting: Infectious Disease

## 2018-04-28 DIAGNOSIS — B20 Human immunodeficiency virus [HIV] disease: Secondary | ICD-10-CM | POA: Diagnosis present

## 2018-04-28 MED ORDER — NYSTATIN 100000 UNIT/GM EX POWD: Freq: Four times a day (QID) | CUTANEOUS | 0 refills | Status: DC

## 2018-04-28 MED ORDER — DORAVIRINE 100 MG PO TABS: 100.0000 mg | ORAL_TABLET | Freq: Every day | ORAL | 11 refills | Status: DC

## 2018-04-28 NOTE — Progress Notes (Signed)
Chief complaint: Here for follow-up for his HIV disease complaining of worsening weight gain what appears to be intertrigo under his right breast  Subjective:    Patient ID: [name], male    DOB: [birth year], 52 y.o.   MRN: [id number]    HPI  BRISTON LAX is a 52 y.o. male with HIV infection, alcoholic cirrhosis with esophageal varices. His HIV remains nicely controlled now on Nenzel and DESCOVY. I   Is complaining of worsening weight gain even in the last 6 months climbing is gained 50 pounds.  We discussed data concerning new or integrase strand transfer inhibitors such as TIVICAY and also issues with newer tenofovir TAF having an association with increased weight.  [name] however has been on [place]ia for probably 6 years or so at least certainly been on DESCOVY for some time as well was temporarily on BIKTARVY.  Certainly I think his weight gain is multifactorial but given the association with new or integrase strand transfer inhibitors I am open to changing that part of his regimen and will substitute DELSTRIGO for his Dazey.  To continue DESCOVY  Past Medical History:  Diagnosis Date  . Alcoholic cirrhosis of liver ([place])   . Alcoholism, chronic ([place])   . ANXIETY 06/23/2006  . Ascites 11/13/2009  . Ascites   . Avulsion fracture of middle phalanges of  3rd/4th fingers left hand 01/07/2014  . Blood dyscrasia    HIV  . Cellulitis of right lower extremity 03/02/2016  . DEPRESSION 06/23/2006  . 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 Erectile dysfunction 07/14/2015  . 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 Gastric ulcer 06/2010  . GERD (gastroesophageal reflux disease)   . HIV DISEASE 06/23/2006  . HYPERLIPIDEMIA 06/23/2006  . HYPERTENSION 06/23/2006   denies  . 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 Intentional benzodiazepine overdose ([place]) 10/2017  . Iron deficiency anemia   . Left foot infection   . Obesity (BMI 30-39.9)    BMI 34 kg/m^2  .  Portal hypertensive gastropathy ([place]) 01/02/2013  . Recent shoulder injury    left shoulder/fell in parking lot/ no surgery/December 26, 2016  . SBP (spontaneous bacterial peritonitis) ([place]) 05/03/2011   Suspected by high leukocytes on paracentesis. Clinical scenario also compatible. November 2012 responded to Levaquin. Started on trimethoprim-sulfamethoxazole double strength daily for prophylaxis.   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 Skin cancer    left calf  . STRAIN, CHEST WALL 03/15/2007  . Suicidal ideation   . Varices, esophageal ([place]) 06/2010  . Venous stasis 03/02/2016  . 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 CARPAL TUNNEL RELEASE     left  . COLONOSCOPY  march 2013  . ESOPHAGEAL BANDING  05/01/2013   Procedure: ESOPHAGEAL BANDING;  Surgeon: Gatha Mayer, MD;  Location: [facility];  Service: Endoscopy;;  . ESOPHAGOGASTRODUODENOSCOPY  07/01/2010;  08/12/10   small varices, gastric ulcer  . ESOPHAGOGASTRODUODENOSCOPY  10/26/2011   Procedure: ESOPHAGOGASTRODUODENOSCOPY (EGD);  Surgeon: Gatha Mayer, MD;  Location: [facility];  Service: Endoscopy;  Laterality: N/A;  . ESOPHAGOGASTRODUODENOSCOPY N/A 01/02/2013   Procedure: ESOPHAGOGASTRODUODENOSCOPY (EGD);  Surgeon: Gatha Mayer, MD;  Location: [facility];  Service: Endoscopy;  Laterality: N/A;  . ESOPHAGOGASTRODUODENOSCOPY N/A 04/23/2013   Procedure: ESOPHAGOGASTRODUODENOSCOPY (EGD);  Surgeon: Gatha Mayer, MD;  Location: [facility];  Service: Endoscopy;  Laterality: N/A;  . ESOPHAGOGASTRODUODENOSCOPY N/A 05/01/2013   Procedure: ESOPHAGOGASTRODUODENOSCOPY (EGD);  Surgeon: Gatha Mayer, MD;  Location: [facility];  Service: Endoscopy;  Laterality: N/A;  follow-up varices and possibly band them  . ESOPHAGOGASTRODUODENOSCOPY N/A 09/04/2013   Procedure: ESOPHAGOGASTRODUODENOSCOPY (EGD);  Surgeon: Gatha Mayer, MD;  Location: [facility];  Service:  Endoscopy;  Laterality: N/A;  . ESOPHAGOGASTRODUODENOSCOPY N/A 09/10/2014   Procedure: ESOPHAGOGASTRODUODENOSCOPY (EGD);  Surgeon: Gatha Mayer, MD;  Location: [facility];  Service: Endoscopy;  Laterality: N/A;  . ESOPHAGOGASTRODUODENOSCOPY (EGD) WITH PROPOFOL N/A 08/21/2015   Procedure: ESOPHAGOGASTRODUODENOSCOPY (EGD) WITH PROPOFOL;  Surgeon: Gatha Mayer, MD;  Location: [facility];  Service: Endoscopy;  Laterality: N/A;  . ESOPHAGOGASTRODUODENOSCOPY (EGD) WITH PROPOFOL N/A 04/06/2017   Procedure: ESOPHAGOGASTRODUODENOSCOPY (EGD) WITH PROPOFOL;  Surgeon: Gatha Mayer, MD;  Location: [facility];  Service: Endoscopy;  Laterality: N/A;  . ESOPHAGOGASTRODUODENOSCOPY W/ BANDING  06/26/2010   variceal ligation  . GASTRIC VARICES BANDING N/A 01/02/2013   Procedure: GASTRIC VARICES BANDING;  Surgeon: Gatha Mayer, MD;  Location: [facility];  Service: Endoscopy;  Laterality: N/A;  possible banding  . UPPER GASTROINTESTINAL ENDOSCOPY      Family History  Problem Relation Age of Onset  . Hyperlipidemia Mother   . Hypertension Mother   . Breast cancer Mother        questionable  . Heart disease Mother   . Hypertension Father   . Irritable bowel syndrome Father   . Drug abuse Brother   . Heart disease Brother   . Breast cancer Maternal Aunt        maternal great aunt  . Alcohol abuse Other   . Heart disease Maternal Uncle   . Irritable bowel syndrome Paternal Aunt   . Colon cancer Neg Hx       Social History   Socioeconomic History  . Marital status: Single    Spouse name: Not on file  . Number of children: 0  . Years of education: Not on file  . Highest education level: Not on file  Occupational History  . Occupation: disability  Social Needs  . Financial resource strain: Not on file  . Food insecurity:    Worry: Not on file    Inability: Not on file  . Transportation needs:    Medical: Not on file    Non-medical: Not on file  Tobacco Use  . Smoking status:  Former Smoker    Packs/day: 0.10    Years: 10.00    Pack years: 1.00    Types: Cigarettes    Start date: 03/03/2014    Last attempt to quit: 04/03/2014    Years since quitting: 4.0  . Smokeless tobacco: Never Used  . Tobacco comment: Chews nicorette gum.  Substance and Sexual Activity  . Alcohol use: Yes    Comment: pt is an alcoholic currently in [employer]  . Drug use: No  . Sexual activity: Yes    Partners: Male    Birth control/protection: Condom    Comment: pt. declined condoms  Lifestyle  . Physical activity:    Days per week: Not on file    Minutes per session: Not on file  . Stress: Not on file  Relationships  . Social connections:    Talks on phone: Not on file    Gets together: Not on file    Attends religious service: Not on file    Active member of club or organization: Not on file    Attends meetings of clubs or organizations: Not on file    Relationship status: Not on file  Other  Topics Concern  . Not on file  Social History Narrative   Single, disabled hair stylist   Lives with parents in a home with a basement.  Parents do not like for him to go down stairs because they are steep.            Allergies  Allergen Reactions  . Penicillins     REACTION: hives Has patient had a PCN reaction causing immediate rash, facial/tongue/throat swelling, SOB or lightheadedness with hypotension: Yes Has patient had a PCN reaction causing severe rash involving mucus membranes or skin necrosis: Yes Has patient had a PCN reaction that required hospitalization No Has patient had a PCN reaction occurring within the last 10 years: Yes If all of the above answers are "NO", then may proceed with Cephalosporin use./Per pt makes him feel weird!   . Sulfa Antibiotics     Pt states he did not have a reaction last time to Sulfa!     Current Outpatient Medications:  .  aMILoride (MIDAMOR) 5 MG tablet, Take 3 tablets (15 mg total) by mouth daily. For high blood  pressure, Disp: , Rfl:  .  amitriptyline (ELAVIL) 100 MG tablet, Take 100 mg by mouth at bedtime., Disp: , Rfl:  .  benzocaine (ORAJEL) 10 % mucosal gel, Use as directed in the mouth or throat 2 (two) times daily as needed for mouth pain., Disp: 5.3 g, Rfl: 0 .  CIALIS 10 MG tablet, TK 1 T PO  BEFORE SEXUAL ACTIVITY, Disp: , Rfl: 0 .  CONSTULOSE 10 GM/15ML solution,  TAKE 30 ML BY MOUTH 3 TIMES DAILY TO RID AMMONIA BUILD-UP, Disp: 240 mL, Rfl: 5 .  diclofenac sodium (VOLTAREN) 1 % GEL, Apply 2 g topically 4 (four) times daily., Disp: , Rfl:  .  dolutegravir (TIVICAY) 50 MG tablet, Take 1 tablet (50 mg total) by mouth daily. For HIV infection., Disp: 30 tablet, Rfl: 3 .  emtricitabine-tenofovir AF (DESCOVY) 200-25 MG tablet, Take 1 tablet by mouth daily. For HIV infection, Disp: 30 tablet, Rfl: 3 .  furosemide (LASIX) 40 MG tablet, Take 1 tablet (40 mg total) by mouth daily. For swellings, Disp: 1 tablet, Rfl: 0 .  hydrOXYzine (ATARAX/VISTARIL) 50 MG tablet, Take 1 tablet (50 mg total) by mouth every 6 (six) hours as needed (mild/moderate anxiety)., Disp: 75 tablet, Rfl: 0 .  lactulose (CHRONULAC) 10 GM/15ML solution, Take 30 mLs (20 g total) by mouth 3 (three) times daily. To rid ammonia build-up, Disp: 3200 mL, Rfl: 5 .  lidocaine (LIDODERM) 5 %, Place 1 patch onto the skin daily. Remove & Discard patch within 12 hours or as directed by MD: For pain management, Disp: 7 patch, Rfl: 0 .  methocarbamol (ROBAXIN) 750 MG tablet, Take 2 tablets by mouth 4 (four) times daily as needed., Disp: , Rfl: 0 .  NEXIUM 40 MG capsule, TK 1 C PO QD B BRE, Disp: , Rfl: 8 .  nicotine polacrilex (NICORETTE) 2 MG gum, Take 1 each (2 mg total) by mouth as needed for smoking cessation. (may purchase from over the counter): For smoking cessation, Disp: 100 tablet, Rfl: 0 .  potassium chloride SA (K-DUR,KLOR-CON) 20 MEQ tablet, Take 2 tablets (40 mEq total) by mouth 2 (two) times daily. For low potassium replacement, Disp: 1  tablet, Rfl: 0 .  PROAIR HFA 108 (90 Base) MCG/ACT inhaler, Inhale 1-2 puffs into the lungs every 4 (four) hours as needed., Disp: , Rfl: 0 .  simethicone (GAS-X) 80 MG  chewable tablet, Chew 1 tablet (80 mg total) by mouth 3 (three) times daily as needed (for flatulence)., Disp: 1 tablet, Rfl: 0 .  thiamine 100 MG tablet, Take 1 tablet (100 mg total) by mouth daily. For thiamine replacement, Disp: 30 tablet, Rfl: 0 .  traZODone (DESYREL) 100 MG tablet, Take 1 tablet (100 mg total) by mouth at bedtime as needed for sleep. For sleep, Disp: 30 tablet, Rfl: 0 .  ZOVIRAX 5 %, APPLY A SUFFICENT AMOUNT TO AFFECTED AREA DAILY FOR 5 DAYS, Disp: , Rfl: 0   Review of Systems  Constitutional: Positive for unexpected weight change. Negative for chills and fever.  HENT: Negative for congestion, rhinorrhea, sinus pressure, sneezing, sore throat and trouble swallowing.   Eyes: Negative for photophobia and visual disturbance.  Respiratory: Negative for cough, chest tightness, shortness of breath, wheezing and stridor.   Cardiovascular: Negative for chest pain, palpitations and leg swelling.  Gastrointestinal: Negative for abdominal distention, abdominal pain, anal bleeding, blood in stool, constipation, diarrhea, nausea and vomiting.  Genitourinary: Negative for difficulty urinating, dysuria, flank pain and hematuria.  Musculoskeletal: Negative for arthralgias, back pain, joint swelling and myalgias.  Skin: Negative for pallor, rash and wound.  Neurological: Negative for dizziness, tremors, seizures and light-headedness.  Hematological: Negative for adenopathy. Does not bruise/bleed easily.  Psychiatric/Behavioral: Positive for dysphoric mood. Negative for agitation, behavioral problems and decreased concentration. The patient is not nervous/anxious.        Objective:   Physical Exam  Constitutional: He is oriented to person, place, and time. He appears well-developed and well-nourished. No distress.    HENT:  Head: Normocephalic and atraumatic.    Mouth/Throat: Oropharynx is clear and moist. No oropharyngeal exudate.  Eyes: Pupils are equal, round, and reactive to light. Conjunctivae and EOM are normal. No scleral icterus.  Neck: Normal range of motion. Neck supple. No JVD present.  Cardiovascular: Normal rate, regular rhythm and normal heart sounds. Exam reveals no gallop and no friction rub.  No murmur heard. Pulmonary/Chest: Effort normal and breath sounds normal. No respiratory distress. He has no wheezes.  Abdominal: Soft. There is no tenderness.  Musculoskeletal: He exhibits no edema or tenderness.       Hands: Lymphadenopathy:    He has no cervical adenopathy.  Neurological: He is alert and oriented to person, place, and time. No sensory deficit. He exhibits normal muscle tone. Gait normal.  Skin: Skin is warm and dry. He is not diaphoretic. No pallor.  Psychiatric: Thought content normal. His speech is delayed.    There is some erythema under his right breast fold    Assessment & Plan:   HIV: To doravirine w DESCOVY, check labs in the next month.  See me back then  Weight gain likely multifactorial but we will get him off the integrase strand transfer inhibitor  Etoh induced cirrhosis with varices: followed by [facility], Dr. Carlean Purl.  He is also been given that diagnosis of Wernicke-Korsakoff syndrome.   Encephalopathy: taking lactulose he claims.  We will check a pneumonia level today for what it is worth  Yeast Infection will give him topical nystatin  I spent greater than 25 minutes with the patient including greater than 50% of time in face to face counsel of the patient guarding the data concerning weight gain associate with TAF and also with new or integrase strand transfer inhibitors also with regards to his new antiretroviral regimen and how to take this and in coordination of his  care.

## 2018-05-01 ENCOUNTER — Telehealth: Payer: Self-pay | Admitting: Internal Medicine

## 2018-05-16 ENCOUNTER — Ambulatory Visit: Payer: Self-pay | Admitting: Internal Medicine

## 2018-05-30 ENCOUNTER — Telehealth: Payer: Self-pay | Admitting: Internal Medicine

## 2018-05-30 ENCOUNTER — Other Ambulatory Visit: Payer: Self-pay

## 2018-05-30 NOTE — Telephone Encounter (Signed)
Patient states that for the last 3 weeks he's been having a lot of rectal bleeding "blood dripping"

## 2018-05-30 NOTE — Telephone Encounter (Signed)
Rectal bleeding for the last several weeks.  He will come in and see Tye Savoy RNP tomorrow at 2:00

## 2018-05-31 ENCOUNTER — Ambulatory Visit: Payer: Self-pay | Admitting: Nurse Practitioner

## 2018-06-12 ENCOUNTER — Other Ambulatory Visit: Payer: Medicaid Other

## 2018-06-20 ENCOUNTER — Other Ambulatory Visit: Payer: Self-pay | Admitting: Infectious Disease

## 2018-06-20 ENCOUNTER — Telehealth: Payer: Self-pay | Admitting: Internal Medicine

## 2018-06-20 DIAGNOSIS — B20 Human immunodeficiency virus [HIV] disease: Secondary | ICD-10-CM

## 2018-06-20 NOTE — Telephone Encounter (Signed)
Patient states he needs last 2 office visit notes sent to Medicaid to get [place]. Patient will get number and call back FYI. Medical records should handle this when pt calls back.

## 2018-06-28 ENCOUNTER — Ambulatory Visit: Payer: Self-pay | Admitting: Internal Medicine

## 2018-06-29 ENCOUNTER — Telehealth: Payer: Self-pay | Admitting: Internal Medicine

## 2018-06-29 NOTE — Telephone Encounter (Signed)
Left message for patient to call back  

## 2018-06-29 NOTE — Telephone Encounter (Signed)
Pt requests a CB.  Pt states he has issue with liver and questions about blood work.

## 2018-07-03 ENCOUNTER — Telehealth: Payer: Self-pay | Admitting: Internal Medicine

## 2018-07-03 NOTE — Telephone Encounter (Signed)
Pt would like a CB regarding paperwork for liver transplant. Pt reports he is having lower back pain. Pt requested a CB.

## 2018-07-03 NOTE — Telephone Encounter (Signed)
No return calls.  See new phone note from today.  I will close this encounter. Duplicate

## 2018-07-03 NOTE — Telephone Encounter (Signed)
Left message for patient to call back  

## 2018-07-04 NOTE — Telephone Encounter (Signed)
Left message for patient to call back  

## 2018-07-05 NOTE — Telephone Encounter (Signed)
No return call from patient. I left a detailed message he should see Dr. Carlean Purl as scheduled on 07/03/18, see his primary care for his low back pain.

## 2018-07-06 ENCOUNTER — Encounter: Payer: Self-pay | Admitting: Internal Medicine

## 2018-07-06 NOTE — Telephone Encounter (Signed)
Left message for patient to call back  

## 2018-07-06 NOTE — Telephone Encounter (Signed)
Pt calling back.  He has questions regarding Medicaid.

## 2018-07-10 NOTE — Telephone Encounter (Signed)
No return calls from patient.  

## 2018-07-21 ENCOUNTER — Other Ambulatory Visit: Payer: Medicaid Other

## 2018-07-27 ENCOUNTER — Telehealth: Payer: Self-pay | Admitting: Internal Medicine

## 2018-07-27 NOTE — Telephone Encounter (Signed)
Patient coming in to see Dr Carlean Purl on 08/03/2018 and wants to discuss the side effects of the Potassium and Amiloride that he read about last night. He's having hoarseness and gets winded walking short distances in the house and balance off at times. [place] last week leaving the [facility]. He wonders if these are from the medicines. These are not new medicines for him he has been on them for years he said.

## 2018-07-28 NOTE — Telephone Encounter (Signed)
Noted Do not think related to these medicines

## 2018-08-03 ENCOUNTER — Ambulatory Visit: Payer: Self-pay | Admitting: Internal Medicine

## 2018-08-04 ENCOUNTER — Encounter: Payer: Medicaid Other | Admitting: Infectious Diseases

## 2018-08-04 ENCOUNTER — Telehealth: Payer: Self-pay | Admitting: Internal Medicine

## 2018-08-04 NOTE — Telephone Encounter (Signed)
Pls call pt as he seems to be confused regarding having an appt with Dr. Carlean Purl. He called wanting to verified his appt with Dr. Carlean Purl next week but there is not appt, which I told pt, then he stated to have called yesterday and being told tha he had an appt on Monday. Then he wanted to make an appt with him but was not sure why, first he said that it was a f/u for labs done last year, then he was not sure if he needed to have labs before his appt. Could you please contact pt to clarify? Thank you.

## 2018-08-04 NOTE — Telephone Encounter (Signed)
Patient notified he was a no show for 08/03/18 appt.  He is rescheduled for 08/29/18 2:00

## 2018-08-07 ENCOUNTER — Other Ambulatory Visit: Payer: Medicaid Other

## 2018-08-07 ENCOUNTER — Other Ambulatory Visit: Payer: Self-pay

## 2018-08-07 DIAGNOSIS — B20 Human immunodeficiency virus [HIV] disease: Secondary | ICD-10-CM

## 2018-08-08 ENCOUNTER — Telehealth: Payer: Self-pay

## 2018-08-08 ENCOUNTER — Other Ambulatory Visit: Payer: Self-pay | Admitting: Internal Medicine

## 2018-08-08 MED ORDER — LACTULOSE 10 GM/15ML PO SOLN: 20.0000 g | Freq: Three times a day (TID) | ORAL | 11 refills | Status: DC

## 2018-08-08 NOTE — Telephone Encounter (Signed)
constulose refilled as approved.

## 2018-08-08 NOTE — Telephone Encounter (Signed)
Refill x 1 year 

## 2018-08-08 NOTE — Telephone Encounter (Signed)
Please advise [name]? 

## 2018-08-08 NOTE — Telephone Encounter (Signed)
Pharmacy requesting constulose refill [name], please advise, thank you.

## 2018-08-29 ENCOUNTER — Encounter: Payer: Medicaid Other | Admitting: Infectious Disease

## 2018-08-29 ENCOUNTER — Ambulatory Visit: Payer: Self-pay | Admitting: Internal Medicine

## 2018-09-29 ENCOUNTER — Telehealth: Payer: Self-pay | Admitting: Internal Medicine

## 2018-09-29 NOTE — Telephone Encounter (Signed)
Unable to reach [place] at either of his #'s. Reached his brother [name] and he said [name] is okay and doesn't need an antibiotic.

## 2018-10-25 ENCOUNTER — Other Ambulatory Visit: Payer: Self-pay | Admitting: Infectious Disease

## 2018-10-25 DIAGNOSIS — B20 Human immunodeficiency virus [HIV] disease: Secondary | ICD-10-CM

## 2018-10-25 NOTE — Telephone Encounter (Signed)
Hold until appointment 5/20

## 2018-10-26 ENCOUNTER — Other Ambulatory Visit: Payer: Self-pay

## 2018-10-26 DIAGNOSIS — B20 Human immunodeficiency virus [HIV] disease: Secondary | ICD-10-CM

## 2018-10-26 MED ORDER — EMTRICITABINE-TENOFOVIR AF 200-25 MG PO TABS: 1.0000 | ORAL_TABLET | Freq: Every day | ORAL | 5 refills | Status: DC

## 2018-10-26 MED ORDER — DORAVIRINE 100 MG PO TABS: 100.0000 mg | ORAL_TABLET | Freq: Every day | ORAL | 5 refills | Status: DC

## 2018-11-01 ENCOUNTER — Other Ambulatory Visit: Payer: Self-pay

## 2018-11-01 ENCOUNTER — Telehealth: Payer: Self-pay | Admitting: Infectious Disease

## 2018-11-01 ENCOUNTER — Telehealth: Payer: Self-pay | Admitting: *Deleted

## 2018-11-01 ENCOUNTER — Other Ambulatory Visit: Payer: Medicaid Other

## 2018-11-01 ENCOUNTER — Other Ambulatory Visit: Payer: Self-pay | Admitting: Infectious Disease

## 2018-11-01 ENCOUNTER — Other Ambulatory Visit: Payer: Self-pay | Admitting: Internal Medicine

## 2018-11-01 DIAGNOSIS — B20 Human immunodeficiency virus [HIV] disease: Secondary | ICD-10-CM

## 2018-11-01 NOTE — Telephone Encounter (Signed)
Patient here for labs, asked to speak with a nurse for "feeling sick, symptoms."  Patient wanted to let Dr Tommy Medal know he is not sleeping well since he is taking care of his father with dementia, his feet are swelling and he needs refill of medications from his primary care physician.  Patient has compression stockings, but states they are too tight.   RN asked if he had notified his PCP of all of these questions, patient stated that they wanted to see him before they would help him. He does not want to keep his PCP because she told him he "couldn't afford the liver transplant" and that the liver transplant "wouldn't help you lose weight."  He wants Dr Tommy Medal to do this, says that the cost would be $0 for a transplant if he goes to [facility].  RN reminded him that with his insurance, everything needs to come from PCP. RN encouraged patient to follow up with PCP until he is able to switch to another one.  He needs to work with social services to get a new pcp. Patient upset, states that I was no help, but knows he has to follow through with this process. Landis Gandy, RN

## 2018-11-01 NOTE — Telephone Encounter (Signed)
Patients K was 2.6  He should hold his lasix and increase his KCL intake though I need to know what he is doing in terms of current meds  I left message on his VM mobile  His Home phone does not work

## 2018-11-01 NOTE — Telephone Encounter (Signed)
He should follow-up with PCP with regards to these complaints  I would also CC Dr. Carlean Purl on this note  I am skeptical he will get a liver transplant RIGHT in middle  this environment of COVID

## 2018-11-02 LAB — T-HELPER CELL (CD4) - (RCID CLINIC ONLY)
CD4 % Helper T Cell: 31 % — ABNORMAL LOW (ref 33–65)
CD4 T Cell Abs: 309 /uL — ABNORMAL LOW (ref 400–1790)

## 2018-11-02 NOTE — Telephone Encounter (Signed)
3 refills 

## 2018-11-02 NOTE — Telephone Encounter (Signed)
Unsuccessful in attempt to reach patient, also had to leave voicemail. Will follow Friday 5/22.

## 2018-11-02 NOTE — Telephone Encounter (Signed)
How many refills [name]? Thank you. 

## 2018-11-03 NOTE — Telephone Encounter (Signed)
May need to call family member to make sure he is ok if he keeps not picking up phone

## 2018-11-03 NOTE — Telephone Encounter (Signed)
Spoke with Remo Lipps today. He saw the missed calls, but didn't check voicemail.  He will listen to your message now.

## 2018-11-09 ENCOUNTER — Other Ambulatory Visit: Payer: Self-pay | Admitting: Internal Medicine

## 2018-11-13 LAB — COMPLETE METABOLIC PANEL WITH GFR
AG Ratio: 1.5 (calc) (ref 1.0–2.5)
ALT: 21 U/L (ref 9–46)
AST: 31 U/L (ref 10–35)
Albumin: 4.2 g/dL (ref 3.6–5.1)
Alkaline phosphatase (APISO): 94 U/L (ref 35–144)
BUN/Creatinine Ratio: 11 (calc) (ref 6–22)
BUN: 15 mg/dL (ref 7–25)
CO2: 26 mmol/L (ref 20–32)
Calcium: 9.2 mg/dL (ref 8.6–10.3)
Chloride: 100 mmol/L (ref 98–110)
Creat: 1.34 mg/dL — ABNORMAL HIGH (ref 0.70–1.33)
GFR, Est African American: 70 mL/min/{1.73_m2} (ref >=60)
GFR, Est Non African American: 60 mL/min/{1.73_m2} (ref >=60)
Globulin: 2.8 g/dL (calc) (ref 1.9–3.7)
Glucose, Bld: 170 mg/dL — ABNORMAL HIGH (ref 65–99)
Potassium: 2.6 mmol/L — CL (ref 3.5–5.3)
Sodium: 139 mmol/L (ref 135–146)
Total Bilirubin: 1.4 mg/dL — ABNORMAL HIGH (ref 0.2–1.2)
Total Protein: 7 g/dL (ref 6.1–8.1)

## 2018-11-13 LAB — CBC WITH DIFFERENTIAL/PLATELET
Absolute Monocytes: 409 cells/uL (ref 200–950)
Basophils Absolute: 39 cells/uL (ref 0–200)
Basophils Relative: 0.7 %
Eosinophils Absolute: 162 cells/uL (ref 15–500)
Eosinophils Relative: 2.9 %
HCT: 38 % — ABNORMAL LOW (ref 38.5–50.0)
Hemoglobin: 12.7 g/dL — ABNORMAL LOW (ref 13.2–17.1)
Lymphs Abs: 1070 cells/uL (ref 850–3900)
MCH: 27.3 pg (ref 27.0–33.0)
MCHC: 33.4 g/dL (ref 32.0–36.0)
MCV: 81.5 fL (ref 80.0–100.0)
MPV: 11.6 fL (ref 7.5–12.5)
Monocytes Relative: 7.3 %
Neutro Abs: 3920 cells/uL (ref 1500–7800)
Neutrophils Relative %: 70 %
Platelets: 116 10*3/uL — ABNORMAL LOW (ref 140–400)
RBC: 4.66 10*6/uL (ref 4.20–5.80)
RDW: 16.7 % — ABNORMAL HIGH (ref 11.0–15.0)
Total Lymphocyte: 19.1 %
WBC: 5.6 10*3/uL (ref 3.8–10.8)

## 2018-11-13 LAB — HIV-1 RNA QUANT-NO REFLEX-BLD
HIV 1 RNA Quant: <20 copies/mL
HIV-1 RNA Quant, Log: <1.3 Log copies/mL

## 2018-11-15 ENCOUNTER — Encounter: Payer: Medicaid Other | Admitting: Infectious Disease

## 2018-11-15 ENCOUNTER — Telehealth: Payer: Self-pay | Admitting: Internal Medicine

## 2018-11-15 NOTE — Telephone Encounter (Signed)
Left message for patient to call back  

## 2018-11-15 NOTE — Telephone Encounter (Signed)
Patient called in requesting to speak to the nurse. He stated that he has some ongoing issues with his health that he wants to only discuss with the nurse.

## 2018-11-20 NOTE — Telephone Encounter (Signed)
Wants to speak to [facility] about his T-Cells?

## 2018-11-20 NOTE — Telephone Encounter (Signed)
Left message for patient to call back  

## 2018-11-21 NOTE — Telephone Encounter (Signed)
Returned call again.  NO return calls,  I tried both numbers.  Will await a return call from the patient.

## 2018-11-22 ENCOUNTER — Ambulatory Visit (INDEPENDENT_AMBULATORY_CARE_PROVIDER_SITE_OTHER): Payer: Medicaid Other | Admitting: Infectious Disease

## 2018-11-22 ENCOUNTER — Encounter: Payer: Self-pay | Admitting: Infectious Disease

## 2018-11-22 ENCOUNTER — Other Ambulatory Visit: Payer: Self-pay

## 2018-11-22 VITALS — BP 175/84 | HR 80 | Temp 98.1°F | Wt 345.0 lb

## 2018-11-22 DIAGNOSIS — K7682 Hepatic encephalopathy: Secondary | ICD-10-CM

## 2018-11-22 DIAGNOSIS — K7031 Alcoholic cirrhosis of liver with ascites: Secondary | ICD-10-CM | POA: Diagnosis not present

## 2018-11-22 DIAGNOSIS — B20 Human immunodeficiency virus [HIV] disease: Secondary | ICD-10-CM

## 2018-11-22 DIAGNOSIS — F411 Generalized anxiety disorder: Secondary | ICD-10-CM | POA: Diagnosis not present

## 2018-11-22 DIAGNOSIS — E876 Hypokalemia: Secondary | ICD-10-CM

## 2018-11-22 DIAGNOSIS — D61818 Other pancytopenia: Secondary | ICD-10-CM

## 2018-11-22 DIAGNOSIS — K7290 Hepatic failure, unspecified without coma: Secondary | ICD-10-CM | POA: Diagnosis not present

## 2018-11-22 LAB — COMPREHENSIVE METABOLIC PANEL
ALT: 23 U/L (ref 0–44)
AST: 30 U/L (ref 15–41)
Albumin: 3.7 g/dL (ref 3.5–5.0)
Alkaline Phosphatase: 79 U/L (ref 38–126)
Anion gap: 11 (ref 5–15)
BUN: 12 mg/dL (ref 6–20)
CO2: 23 mmol/L (ref 22–32)
Calcium: 8.6 mg/dL — ABNORMAL LOW (ref 8.9–10.3)
Chloride: 106 mmol/L (ref 98–111)
Creatinine, Ser: 1.13 mg/dL (ref 0.61–1.24)
GFR calc Af Amer: >60 mL/min (ref >60)
GFR calc non Af Amer: >60 mL/min (ref >60)
Glucose, Bld: 118 mg/dL — ABNORMAL HIGH (ref 70–99)
Potassium: 3.7 mmol/L (ref 3.5–5.1)
Sodium: 140 mmol/L (ref 135–145)
Total Bilirubin: 1.7 mg/dL — ABNORMAL HIGH (ref 0.3–1.2)
Total Protein: 6 g/dL — ABNORMAL LOW (ref 6.5–8.1)

## 2018-11-22 NOTE — Progress Notes (Signed)
Chief complaint: Here for follow-up for his HIV disease complaining of worsening weight gain what appears to be intertrigo under his right breast  Subjective:    Patient ID: [name], male    DOB: [birth year], 53 y.o.   MRN: [id number]    HPI  [name] is a 53 y.o. male with HIV infection, alcoholic cirrhosis with esophageal varices. His HIV remains nicely controlled now on Pifeltro and DESCOVY  When [name] came in for labs last visit his potassium had dropped to 2.6.  This did happen because he had suddenly decided that he thought taking potassium was a bad idea.  In the meantime he was continuing to take his Lasix.  My nurse was finally able to get a hold of him and he did restart the potassium that he is still was taking Lasix.  He did not take Lasix today or potassium.  He is not having symptoms of chest pain palpitations dizziness lightheadedness.  I obtained a 12-lead EKG that did show QT Procel to QTC prolongation to 470.  This is about 9 more milliseconds more than it was when he last had an EKG in 2012.     Past Medical History:  Diagnosis Date  . Alcoholic cirrhosis of liver ([place])   . Alcoholism, chronic ([place])   . ANXIETY 06/23/2006  . Ascites 11/13/2009  . Ascites   . Avulsion fracture of middle phalanges of  3rd/4th fingers left hand 01/07/2014  . Blood dyscrasia    HIV  . Cellulitis of right lower extremity 03/02/2016  . DEPRESSION 06/23/2006  . 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 Erectile dysfunction 07/14/2015  . 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 Gastric ulcer 06/2010  . GERD (gastroesophageal reflux disease)   . HIV DISEASE 06/23/2006  . HYPERLIPIDEMIA 06/23/2006  . HYPERTENSION 06/23/2006   denies  . 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 Intentional benzodiazepine overdose ([place]) 10/2017  . Iron deficiency anemia   . Left foot infection   . Obesity (BMI 30-39.9)    BMI 34 kg/m^2  . Portal hypertensive gastropathy  ([place]) 01/02/2013  . Recent shoulder injury    left shoulder/fell in parking lot/ no surgery/December 26, 2016  . SBP (spontaneous bacterial peritonitis) ([place]) 05/03/2011   Suspected by high leukocytes on paracentesis. Clinical scenario also compatible. November 2012 responded to Levaquin. Started on trimethoprim-sulfamethoxazole double strength daily for prophylaxis.   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 Skin cancer    left calf  . STRAIN, CHEST WALL 03/15/2007  . Suicidal ideation   . Varices, esophageal ([place]) 06/2010  . Venous stasis 03/02/2016  . 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 CARPAL TUNNEL RELEASE     left  . COLONOSCOPY  march 2013  . ESOPHAGEAL BANDING  05/01/2013   Procedure: ESOPHAGEAL BANDING;  Surgeon: Gatha Mayer, MD;  Location: [facility];  Service: Endoscopy;;  . ESOPHAGOGASTRODUODENOSCOPY  07/01/2010;  08/12/10   small varices, gastric ulcer  . ESOPHAGOGASTRODUODENOSCOPY  10/26/2011   Procedure: ESOPHAGOGASTRODUODENOSCOPY (EGD);  Surgeon: Gatha Mayer, MD;  Location: [facility];  Service: Endoscopy;  Laterality: N/A;  . ESOPHAGOGASTRODUODENOSCOPY N/A 01/02/2013   Procedure: ESOPHAGOGASTRODUODENOSCOPY (EGD);  Surgeon: Gatha Mayer, MD;  Location: [facility];  Service: Endoscopy;  Laterality: N/A;  . ESOPHAGOGASTRODUODENOSCOPY N/A 04/23/2013   Procedure: ESOPHAGOGASTRODUODENOSCOPY (EGD);  Surgeon: Gatha Mayer, MD;  Location: [facility];  Service: Endoscopy;  Laterality: N/A;  . ESOPHAGOGASTRODUODENOSCOPY N/A  05/01/2013   Procedure: ESOPHAGOGASTRODUODENOSCOPY (EGD);  Surgeon: Gatha Mayer, MD;  Location: [facility];  Service: Endoscopy;  Laterality: N/A;  follow-up varices and possibly band them  . ESOPHAGOGASTRODUODENOSCOPY N/A 09/04/2013   Procedure: ESOPHAGOGASTRODUODENOSCOPY (EGD);  Surgeon: Gatha Mayer, MD;  Location: [facility];  Service: Endoscopy;  Laterality: N/A;  .  ESOPHAGOGASTRODUODENOSCOPY N/A 09/10/2014   Procedure: ESOPHAGOGASTRODUODENOSCOPY (EGD);  Surgeon: Gatha Mayer, MD;  Location: [facility];  Service: Endoscopy;  Laterality: N/A;  . ESOPHAGOGASTRODUODENOSCOPY (EGD) WITH PROPOFOL N/A 08/21/2015   Procedure: ESOPHAGOGASTRODUODENOSCOPY (EGD) WITH PROPOFOL;  Surgeon: Gatha Mayer, MD;  Location: [facility];  Service: Endoscopy;  Laterality: N/A;  . ESOPHAGOGASTRODUODENOSCOPY (EGD) WITH PROPOFOL N/A 04/06/2017   Procedure: ESOPHAGOGASTRODUODENOSCOPY (EGD) WITH PROPOFOL;  Surgeon: Gatha Mayer, MD;  Location: [facility];  Service: Endoscopy;  Laterality: N/A;  . ESOPHAGOGASTRODUODENOSCOPY W/ BANDING  06/26/2010   variceal ligation  . GASTRIC VARICES BANDING N/A 01/02/2013   Procedure: GASTRIC VARICES BANDING;  Surgeon: Gatha Mayer, MD;  Location: [facility];  Service: Endoscopy;  Laterality: N/A;  possible banding  . UPPER GASTROINTESTINAL ENDOSCOPY      Family History  Problem Relation Age of Onset  . Hyperlipidemia Mother   . Hypertension Mother   . Breast cancer Mother        questionable  . Heart disease Mother   . Hypertension Father   . Irritable bowel syndrome Father   . Drug abuse Brother   . Heart disease Brother   . Breast cancer Maternal Aunt        maternal great aunt  . Alcohol abuse Other   . Heart disease Maternal Uncle   . Irritable bowel syndrome Paternal Aunt   . Colon cancer Neg Hx       Social History   Socioeconomic History  . Marital status: Single    Spouse name: Not on file  . Number of children: 0  . Years of education: Not on file  . Highest education level: Not on file  Occupational History  . Occupation: disability  Social Needs  . Financial resource strain: Not on file  . Food insecurity:    Worry: Not on file    Inability: Not on file  . Transportation needs:    Medical: Not on file    Non-medical: Not on file  Tobacco Use  . Smoking status: Former Smoker    Packs/day: 0.10     Years: 10.00    Pack years: 1.00    Types: Cigarettes    Start date: 03/03/2014    Last attempt to quit: 04/03/2014    Years since quitting: 4.6  . Smokeless tobacco: Never Used  . Tobacco comment: Chews nicorette gum.  Substance and Sexual Activity  . Alcohol use: Yes    Comment: pt is an alcoholic currently in [employer]  . Drug use: No  . Sexual activity: Yes    Partners: Male    Birth control/protection: Condom    Comment: pt. declined condoms  Lifestyle  . Physical activity:    Days per week: Not on file    Minutes per session: Not on file  . Stress: Not on file  Relationships  . Social connections:    Talks on phone: Not on file    Gets together: Not on file    Attends religious service: Not on file    Active member of club or organization: Not on file    Attends meetings of clubs or organizations:  Not on file    Relationship status: Not on file  Other Topics Concern  . Not on file  Social History Narrative   Single, disabled hair stylist   Lives with parents in a home with a basement.  Parents do not like for him to go down stairs because they are steep.            Allergies  Allergen Reactions  . Penicillins     REACTION: hives Has patient had a PCN reaction causing immediate rash, facial/tongue/throat swelling, SOB or lightheadedness with hypotension: Yes Has patient had a PCN reaction causing severe rash involving mucus membranes or skin necrosis: Yes Has patient had a PCN reaction that required hospitalization No Has patient had a PCN reaction occurring within the last 10 years: Yes If all of the above answers are "NO", then may proceed with Cephalosporin use./Per pt makes him feel weird!   . Sulfa Antibiotics     Pt states he did not have a reaction last time to Sulfa!     Current Outpatient Medications:  .  aMILoride (MIDAMOR) 5 MG tablet, Take 3 tablets (15 mg total) by mouth daily. For high blood pressure, Disp: , Rfl:  .  amitriptyline  (ELAVIL) 100 MG tablet, TAKE 1 TABLET(100 MG) BY MOUTH AT BEDTIME, Disp: 30 tablet, Rfl: 11 .  benzocaine (ORAJEL) 10 % mucosal gel, Use as directed in the mouth or throat 2 (two) times daily as needed for mouth pain., Disp: 5.3 g, Rfl: 0 .  CIALIS 10 MG tablet, TK 1 T PO  BEFORE SEXUAL ACTIVITY, Disp: , Rfl: 0 .  CONSTULOSE 10 GM/15ML solution,  TAKE 30 ML BY MOUTH 3 TIMES DAILY TO RID AMMONIA BUILD-UP, Disp: 240 mL, Rfl: 5 .  diclofenac sodium (VOLTAREN) 1 % GEL, Apply 2 g topically 4 (four) times daily., Disp: , Rfl:  .  doravirine (PIFELTRO) 100 MG TABS tablet, Take 1 tablet (100 mg total) by mouth daily. Refill with Descovy, Disp: 30 tablet, Rfl: 5 .  emtricitabine-tenofovir AF (DESCOVY) 200-25 MG tablet, Take 1 tablet by mouth daily. Refill with Pifeltro, Disp: 30 tablet, Rfl: 5 .  furosemide (LASIX) 40 MG tablet, TAKE 2 TABLETS(80 MG) BY MOUTH TWICE DAILY, Disp: 120 tablet, Rfl: 2 .  hydrOXYzine (ATARAX/VISTARIL) 50 MG tablet, Take 1 tablet (50 mg total) by mouth every 6 (six) hours as needed (mild/moderate anxiety)., Disp: 75 tablet, Rfl: 0 .  lactulose (CHRONULAC) 10 GM/15ML solution, Take 30 mLs (20 g total) by mouth 3 (three) times daily. To rid ammonia build-up, Disp: 3200 mL, Rfl: 11 .  lidocaine (LIDODERM) 5 %, Place 1 patch onto the skin daily. Remove & Discard patch within 12 hours or as directed by MD: For pain management, Disp: 7 patch, Rfl: 0 .  methocarbamol (ROBAXIN) 750 MG tablet, Take 2 tablets by mouth 4 (four) times daily as needed., Disp: , Rfl: 0 .  NEXIUM 40 MG capsule, TAKE 1 CAPSULE BY MOUTH EVERY DAY BEFORE BREAKFAST, Disp: 30 capsule, Rfl: 5 .  nicotine polacrilex (NICORETTE) 2 MG gum, Take 1 each (2 mg total) by mouth as needed for smoking cessation. (may purchase from over the counter): For smoking cessation, Disp: 100 tablet, Rfl: 0 .  nystatin (MYCOSTATIN/NYSTOP) powder, Apply topically 4 (four) times daily., Disp: 15 g, Rfl: 0 .  potassium chloride SA  (K-DUR,KLOR-CON) 20 MEQ tablet, Take 2 tablets (40 mEq total) by mouth 2 (two) times daily. For low potassium replacement, Disp: 1 tablet, Rfl:  0 .  PROAIR HFA 108 (90 Base) MCG/ACT inhaler, Inhale 1-2 puffs into the lungs every 4 (four) hours as needed., Disp: , Rfl: 0 .  simethicone (GAS-X) 80 MG chewable tablet, Chew 1 tablet (80 mg total) by mouth 3 (three) times daily as needed (for flatulence)., Disp: 1 tablet, Rfl: 0 .  thiamine 100 MG tablet, Take 1 tablet (100 mg total) by mouth daily. For thiamine replacement, Disp: 30 tablet, Rfl: 0 .  traZODone (DESYREL) 100 MG tablet, Take 1 tablet (100 mg total) by mouth at bedtime as needed for sleep. For sleep, Disp: 30 tablet, Rfl: 0 .  ZOVIRAX 5 %, APPLY A SUFFICENT AMOUNT TO AFFECTED AREA DAILY FOR 5 DAYS, Disp: , Rfl: 0   Review of Systems  Constitutional: Negative for chills, fever and unexpected weight change.  HENT: Negative for congestion, rhinorrhea, sinus pressure, sneezing, sore throat and trouble swallowing.   Eyes: Negative for photophobia and visual disturbance.  Respiratory: Negative for cough, chest tightness, shortness of breath, wheezing and stridor.   Cardiovascular: Negative for chest pain, palpitations and leg swelling.  Gastrointestinal: Negative for abdominal distention, abdominal pain, anal bleeding, blood in stool, constipation, diarrhea, nausea and vomiting.  Genitourinary: Negative for difficulty urinating, dysuria, flank pain and hematuria.  Musculoskeletal: Negative for arthralgias, back pain, joint swelling and myalgias.  Skin: Negative for pallor, rash and wound.  Neurological: Negative for dizziness, tremors, seizures and light-headedness.  Hematological: Negative for adenopathy. Does not bruise/bleed easily.  Psychiatric/Behavioral: Positive for dysphoric mood. Negative for agitation, behavioral problems and decreased concentration. The patient is nervous/anxious.        Objective:   Physical Exam   Constitutional: He is oriented to person, place, and time. He appears well-developed and well-nourished. No distress.  HENT:  Head: Normocephalic and atraumatic.    Mouth/Throat: Oropharynx is clear and moist. No oropharyngeal exudate.  Eyes: Pupils are equal, round, and reactive to light. Conjunctivae and EOM are normal. No scleral icterus.  Neck: Normal range of motion. Neck supple. No JVD present.  Cardiovascular: Normal rate, regular rhythm and normal heart sounds. Exam reveals no gallop and no friction rub.  No murmur heard. Pulmonary/Chest: Effort normal and breath sounds normal. No respiratory distress. He has no wheezes.  Abdominal: Soft. There is no abdominal tenderness.  Musculoskeletal:        General: No tenderness or edema.       Hands:  Lymphadenopathy:    He has no cervical adenopathy.  Neurological: He is alert and oriented to person, place, and time. No sensory deficit. He exhibits normal muscle tone. Gait normal.  Skin: Skin is warm and dry. He is not diaphoretic. No pallor.  Psychiatric: He has a normal mood and affect. His behavior is normal. Judgment and thought content normal. His speech is delayed.    There is some erythema under his right breast fold    Assessment & Plan:  Hypokalemia: I am checking a stat CMP and will come up with a plan for potassium replacement while holding his Lasix.  I making appointment for him to come see me next week as well and we can try to come up with a plan he does really need to be seen by primary care though he claims he does not have one now.  His EKG showed QTC prolongation of 470 which was slightly worse than when he had an EKG done in 2012.  He had T wave inversion in lead V1 but otherwise no other changes  that were significant between the 2 EKGs over the last 8 years.   HIV: Continue doravirine w DESCOVY,   Weight gain likely multifactorial   Etoh induced cirrhosis with varices: followed by [facility], Dr. Carlean Purl.  He is  also been given that diagnosis of Wernicke-Korsakoff syndrome.   Encephalopathy: taking lactulose    I spent greater than 40 minutes with the patient including greater than 50% of time in face to face counsel of the patient the risks of hypokalemia, QTC prolongation including lethal arrhythmias, the needs to always be taking potassium when he is taking a potassium wasting diuretic such as Lasix. And in coordination of his care.

## 2018-11-23 ENCOUNTER — Telehealth: Payer: Self-pay

## 2018-11-23 NOTE — Telephone Encounter (Signed)
-----   Message from Truman Hayward, MD sent at 11/23/2018  9:53 AM EDT ----- Patients K near normal now. He can resume taking both his lasix and potassium as previously prescribed but he MUST TAKE BOTH

## 2018-11-24 NOTE — Telephone Encounter (Signed)
HOw about this. Let him come in for a lab visit at time of his appt and we can do an e visit or in person visit afterwards to review his diuretics and KCL doses

## 2018-11-27 ENCOUNTER — Encounter: Payer: Self-pay | Admitting: Infectious Disease

## 2018-11-27 ENCOUNTER — Other Ambulatory Visit: Payer: Self-pay | Admitting: Infectious Disease

## 2018-11-27 ENCOUNTER — Other Ambulatory Visit: Payer: Self-pay

## 2018-11-27 ENCOUNTER — Ambulatory Visit: Payer: Medicaid Other | Admitting: Infectious Disease

## 2018-11-27 DIAGNOSIS — B20 Human immunodeficiency virus [HIV] disease: Secondary | ICD-10-CM

## 2018-11-27 DIAGNOSIS — E876 Hypokalemia: Secondary | ICD-10-CM

## 2018-11-27 DIAGNOSIS — F1020 Alcohol dependence, uncomplicated: Secondary | ICD-10-CM

## 2018-11-27 DIAGNOSIS — F411 Generalized anxiety disorder: Secondary | ICD-10-CM

## 2018-11-27 NOTE — Progress Notes (Signed)
[name] never picked up the phone after I called him on several occasions this morning.  He will need to reschedule his visit

## 2018-11-28 ENCOUNTER — Telehealth: Payer: Self-pay

## 2018-11-28 NOTE — Telephone Encounter (Signed)
Called patient to reschedule evisit due to provider not being able to reach him on 6/15. Patient needs repeat labs to check potassium  and follow up with evisit with Dr. Tommy Medal Eugenia Mcalpine, LPN

## 2018-12-04 ENCOUNTER — Telehealth: Payer: Self-pay

## 2018-12-04 ENCOUNTER — Other Ambulatory Visit: Payer: Self-pay

## 2018-12-04 DIAGNOSIS — B20 Human immunodeficiency virus [HIV] disease: Secondary | ICD-10-CM

## 2018-12-04 NOTE — Telephone Encounter (Signed)
Patient scheudled for repeat labs for hypokalemia and also scheduled follow up evisit with Dr. Tommy Medal Eugenia Mcalpine, LPN

## 2018-12-04 NOTE — Telephone Encounter (Signed)
Thanks Shaquenia!

## 2018-12-07 ENCOUNTER — Other Ambulatory Visit: Payer: Medicaid Other

## 2018-12-12 ENCOUNTER — Other Ambulatory Visit: Payer: Self-pay | Admitting: Infectious Disease

## 2018-12-12 DIAGNOSIS — B20 Human immunodeficiency virus [HIV] disease: Secondary | ICD-10-CM

## 2018-12-14 ENCOUNTER — Ambulatory Visit: Payer: Medicaid Other | Admitting: Infectious Disease

## 2018-12-14 ENCOUNTER — Other Ambulatory Visit: Payer: Self-pay

## 2018-12-21 ENCOUNTER — Telehealth: Payer: Self-pay | Admitting: *Deleted
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
## 2018-12-26 ENCOUNTER — Telehealth: Payer: Self-pay

## 2018-12-26 NOTE — Telephone Encounter (Signed)
Spoke with [employer] at [phone] to get a re-authorization for patient to get brand name Nexium. Patient ID# [id number]. Patient has tried and failed pantoprazole and omeprazole.  Dx:K21.9. Per Butch Penny it is approved, PA# [id number] thru 12/21/2019. [place] pharmacy informed in [place] # [phone].

## 2018-12-27 ENCOUNTER — Other Ambulatory Visit: Payer: Self-pay | Admitting: Infectious Disease

## 2019-01-03 ENCOUNTER — Encounter: Payer: Self-pay | Admitting: Infectious Disease

## 2019-01-03 ENCOUNTER — Ambulatory Visit (INDEPENDENT_AMBULATORY_CARE_PROVIDER_SITE_OTHER): Payer: Medicaid Other | Admitting: Infectious Disease

## 2019-01-03 DIAGNOSIS — F4321 Adjustment disorder with depressed mood: Secondary | ICD-10-CM | POA: Diagnosis not present

## 2019-01-03 DIAGNOSIS — F1020 Alcohol dependence, uncomplicated: Secondary | ICD-10-CM

## 2019-01-03 DIAGNOSIS — B20 Human immunodeficiency virus [HIV] disease: Secondary | ICD-10-CM

## 2019-01-03 DIAGNOSIS — T7611XD Adult physical abuse, suspected, subsequent encounter: Secondary | ICD-10-CM

## 2019-01-03 DIAGNOSIS — G9009 Other idiopathic peripheral autonomic neuropathy: Secondary | ICD-10-CM

## 2019-01-03 DIAGNOSIS — T7411XA Adult physical abuse, confirmed, initial encounter: Secondary | ICD-10-CM

## 2019-01-03 DIAGNOSIS — G4700 Insomnia, unspecified: Secondary | ICD-10-CM

## 2019-01-03 DIAGNOSIS — F04 Amnestic disorder due to known physiological condition: Secondary | ICD-10-CM

## 2019-01-03 DIAGNOSIS — K7031 Alcoholic cirrhosis of liver with ascites: Secondary | ICD-10-CM

## 2019-01-03 DIAGNOSIS — F331 Major depressive disorder, recurrent, moderate: Secondary | ICD-10-CM

## 2019-01-03 HISTORY — DX: Adult physical abuse, confirmed, initial encounter: T74.11XA

## 2019-01-03 HISTORY — DX: Adjustment disorder with depressed mood: F43.21

## 2019-01-03 MED ORDER — TRAZODONE HCL 100 MG PO TABS: 100.0000 mg | ORAL_TABLET | Freq: Every evening | ORAL | 11 refills | Status: DC | PRN
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
## 2019-01-11 ENCOUNTER — Ambulatory Visit: Payer: Medicaid Other | Admitting: Infectious Disease

## 2019-01-15 ENCOUNTER — Other Ambulatory Visit: Payer: Self-pay | Admitting: Internal Medicine

## 2019-02-21 ENCOUNTER — Telehealth: Payer: Self-pay

## 2019-02-21 NOTE — Telephone Encounter (Signed)
Ok thanks so much 

## 2019-02-21 NOTE — Telephone Encounter (Signed)
-----   Message from [place] sent at 02/21/2019  2:06 PM EDT ----- Regarding: Living Liver Donor Patient called wanting to let us know  that he is trying to get into the living liver donor program and said that [facility] might reach  out to [place] to ask some questions about him- Just wanted to give [place] the heads up

## 2019-02-22 ENCOUNTER — Other Ambulatory Visit: Payer: Self-pay | Admitting: Internal Medicine

## 2019-02-23 ENCOUNTER — Telehealth: Payer: Self-pay | Admitting: Internal Medicine

## 2019-02-23 NOTE — Telephone Encounter (Signed)
Please advise [name], thank you. 

## 2019-03-02 ENCOUNTER — Other Ambulatory Visit: Payer: Self-pay | Admitting: Internal Medicine

## 2019-03-08 ENCOUNTER — Telehealth: Payer: Self-pay
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
## 2019-03-09 ENCOUNTER — Telehealth: Payer: Self-pay

## 2019-03-09 NOTE — Telephone Encounter (Signed)
Opened in error
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
## 2019-03-12 NOTE — Telephone Encounter (Signed)
I made 4 phone calls to [place] on 2 different numbers this weekend

## 2019-03-29 ENCOUNTER — Other Ambulatory Visit: Payer: Self-pay | Admitting: Internal Medicine

## 2019-03-30 NOTE — Telephone Encounter (Signed)
Refill x 2 but he needs an OV alsonext available

## 2019-03-30 NOTE — Telephone Encounter (Signed)
Please advise [name], thank you. 

## 2019-04-08 ENCOUNTER — Inpatient Hospital Stay (HOSPITAL_COMMUNITY)
Admission: EM | Admit: 2019-04-08 | Discharge: 2019-04-12 | DRG: 441 | Disposition: A | Discharge status: Rehabilitation Facility | Payer: Medicaid Other | Attending: Internal Medicine | Admitting: Internal Medicine

## 2019-04-08 ENCOUNTER — Other Ambulatory Visit: Payer: Self-pay

## 2019-04-08 ENCOUNTER — Encounter (HOSPITAL_COMMUNITY): Payer: Self-pay | Admitting: Oncology

## 2019-04-08 ENCOUNTER — Emergency Department (HOSPITAL_COMMUNITY): Payer: Medicaid Other

## 2019-04-08 DIAGNOSIS — K766 Portal hypertension: Secondary | ICD-10-CM | POA: Diagnosis present

## 2019-04-08 DIAGNOSIS — D696 Thrombocytopenia, unspecified: Secondary | ICD-10-CM | POA: Diagnosis not present

## 2019-04-08 DIAGNOSIS — K7200 Acute and subacute hepatic failure without coma: Secondary | ICD-10-CM | POA: Diagnosis present

## 2019-04-08 DIAGNOSIS — D6959 Other secondary thrombocytopenia: Secondary | ICD-10-CM | POA: Diagnosis present

## 2019-04-08 DIAGNOSIS — F5104 Psychophysiologic insomnia: Secondary | ICD-10-CM | POA: Diagnosis present

## 2019-04-08 DIAGNOSIS — G9341 Metabolic encephalopathy: Secondary | ICD-10-CM | POA: Diagnosis present

## 2019-04-08 DIAGNOSIS — R296 Repeated falls: Secondary | ICD-10-CM | POA: Diagnosis present

## 2019-04-08 DIAGNOSIS — F32A Depression, unspecified: Secondary | ICD-10-CM | POA: Diagnosis present

## 2019-04-08 DIAGNOSIS — E7220 Disorder of urea cycle metabolism, unspecified: Secondary | ICD-10-CM

## 2019-04-08 DIAGNOSIS — K7030 Alcoholic cirrhosis of liver without ascites: Secondary | ICD-10-CM | POA: Diagnosis present

## 2019-04-08 DIAGNOSIS — Z882 Allergy status to sulfonamides status: Secondary | ICD-10-CM

## 2019-04-08 DIAGNOSIS — Z21 Asymptomatic human immunodeficiency virus [HIV] infection status: Secondary | ICD-10-CM | POA: Diagnosis present

## 2019-04-08 DIAGNOSIS — I10 Essential (primary) hypertension: Secondary | ICD-10-CM | POA: Diagnosis present

## 2019-04-08 DIAGNOSIS — I878 Other specified disorders of veins: Secondary | ICD-10-CM | POA: Diagnosis present

## 2019-04-08 DIAGNOSIS — K7031 Alcoholic cirrhosis of liver with ascites: Secondary | ICD-10-CM

## 2019-04-08 DIAGNOSIS — B20 Human immunodeficiency virus [HIV] disease: Secondary | ICD-10-CM | POA: Diagnosis present

## 2019-04-08 DIAGNOSIS — K7290 Hepatic failure, unspecified without coma: Secondary | ICD-10-CM | POA: Diagnosis not present

## 2019-04-08 DIAGNOSIS — Z6841 Body Mass Index (BMI) 40.0 and over, adult: Secondary | ICD-10-CM | POA: Diagnosis not present

## 2019-04-08 DIAGNOSIS — R7989 Other specified abnormal findings of blood chemistry: Secondary | ICD-10-CM | POA: Diagnosis not present

## 2019-04-08 DIAGNOSIS — F329 Major depressive disorder, single episode, unspecified: Secondary | ICD-10-CM | POA: Diagnosis present

## 2019-04-08 DIAGNOSIS — Z8249 Family history of ischemic heart disease and other diseases of the circulatory system: Secondary | ICD-10-CM

## 2019-04-08 DIAGNOSIS — K219 Gastro-esophageal reflux disease without esophagitis: Secondary | ICD-10-CM | POA: Diagnosis present

## 2019-04-08 DIAGNOSIS — Z8349 Family history of other endocrine, nutritional and metabolic diseases: Secondary | ICD-10-CM

## 2019-04-08 DIAGNOSIS — G9009 Other idiopathic peripheral autonomic neuropathy: Secondary | ICD-10-CM | POA: Diagnosis present

## 2019-04-08 DIAGNOSIS — E876 Hypokalemia: Secondary | ICD-10-CM | POA: Diagnosis present

## 2019-04-08 DIAGNOSIS — N529 Male erectile dysfunction, unspecified: Secondary | ICD-10-CM | POA: Diagnosis present

## 2019-04-08 DIAGNOSIS — D709 Neutropenia, unspecified: Secondary | ICD-10-CM | POA: Diagnosis present

## 2019-04-08 DIAGNOSIS — Z20828 Contact with and (suspected) exposure to other viral communicable diseases: Secondary | ICD-10-CM | POA: Diagnosis present

## 2019-04-08 DIAGNOSIS — Z8711 Personal history of peptic ulcer disease: Secondary | ICD-10-CM

## 2019-04-08 DIAGNOSIS — F411 Generalized anxiety disorder: Secondary | ICD-10-CM | POA: Diagnosis present

## 2019-04-08 DIAGNOSIS — Z91410 Personal history of adult physical and sexual abuse: Secondary | ICD-10-CM

## 2019-04-08 DIAGNOSIS — Z87891 Personal history of nicotine dependence: Secondary | ICD-10-CM

## 2019-04-08 DIAGNOSIS — R4182 Altered mental status, unspecified: Secondary | ICD-10-CM | POA: Diagnosis not present

## 2019-04-08 DIAGNOSIS — Z79899 Other long term (current) drug therapy: Secondary | ICD-10-CM | POA: Diagnosis not present

## 2019-04-08 DIAGNOSIS — K3189 Other diseases of stomach and duodenum: Secondary | ICD-10-CM | POA: Diagnosis present

## 2019-04-08 DIAGNOSIS — Z880 Allergy status to penicillin: Secondary | ICD-10-CM

## 2019-04-08 DIAGNOSIS — F04 Amnestic disorder due to known physiological condition: Secondary | ICD-10-CM | POA: Diagnosis present

## 2019-04-08 DIAGNOSIS — Z915 Personal history of self-harm: Secondary | ICD-10-CM

## 2019-04-08 DIAGNOSIS — E785 Hyperlipidemia, unspecified: Secondary | ICD-10-CM | POA: Diagnosis present

## 2019-04-08 DIAGNOSIS — Z85828 Personal history of other malignant neoplasm of skin: Secondary | ICD-10-CM

## 2019-04-08 DIAGNOSIS — K7682 Hepatic encephalopathy: Secondary | ICD-10-CM | POA: Diagnosis present

## 2019-04-08 LAB — GLUCOSE, CSF: Glucose, CSF: 65 mg/dL (ref 40–70)

## 2019-04-08 LAB — URINALYSIS, ROUTINE W REFLEX MICROSCOPIC
Bilirubin Urine: NEGATIVE
Glucose, UA: NEGATIVE mg/dL
Hgb urine dipstick: NEGATIVE
Ketones, ur: NEGATIVE mg/dL
Leukocytes,Ua: NEGATIVE
Nitrite: NEGATIVE
Protein, ur: NEGATIVE mg/dL
Specific Gravity, Urine: 1.006 (ref 1.005–1.030)
pH: 6 (ref 5.0–8.0)

## 2019-04-08 LAB — CBC
HCT: 38.3 % — ABNORMAL LOW (ref 39.0–52.0)
HCT: 39.2 % (ref 39.0–52.0)
Hemoglobin: 12.7 g/dL — ABNORMAL LOW (ref 13.0–17.0)
Hemoglobin: 12.7 g/dL — ABNORMAL LOW (ref 13.0–17.0)
MCH: 28.2 pg (ref 26.0–34.0)
MCH: 27.9 pg (ref 26.0–34.0)
MCHC: 33.2 g/dL (ref 30.0–36.0)
MCHC: 32.4 g/dL (ref 30.0–36.0)
MCV: 85.1 fL (ref 80.0–100.0)
MCV: 86 fL (ref 80.0–100.0)
Platelets: 88 10*3/uL — ABNORMAL LOW (ref 150–400)
Platelets: 96 10*3/uL — ABNORMAL LOW (ref 150–400)
RBC: 4.5 MIL/uL (ref 4.22–5.81)
RBC: 4.56 MIL/uL (ref 4.22–5.81)
RDW: 16 % — ABNORMAL HIGH (ref 11.5–15.5)
RDW: 16 % — ABNORMAL HIGH (ref 11.5–15.5)
WBC: 4.6 10*3/uL (ref 4.0–10.5)
WBC: 3.9 10*3/uL — ABNORMAL LOW (ref 4.0–10.5)
nRBC: 0 % (ref 0.0–0.2)
nRBC: 0 % (ref 0.0–0.2)

## 2019-04-08 LAB — RAPID URINE DRUG SCREEN, HOSP PERFORMED
Amphetamines: NOT DETECTED
Barbiturates: NOT DETECTED
Benzodiazepines: NOT DETECTED
Cocaine: NOT DETECTED
Opiates: NOT DETECTED
Tetrahydrocannabinol: NOT DETECTED

## 2019-04-08 LAB — COMPREHENSIVE METABOLIC PANEL
ALT: 25 U/L (ref 0–44)
AST: 43 U/L — ABNORMAL HIGH (ref 15–41)
Albumin: 3.8 g/dL (ref 3.5–5.0)
Alkaline Phosphatase: 89 U/L (ref 38–126)
Anion gap: 13 (ref 5–15)
BUN: 17 mg/dL (ref 6–20)
CO2: 24 mmol/L (ref 22–32)
Calcium: 9.1 mg/dL (ref 8.9–10.3)
Chloride: 99 mmol/L (ref 98–111)
Creatinine, Ser: 1.37 mg/dL — ABNORMAL HIGH (ref 0.61–1.24)
GFR calc Af Amer: >60 mL/min (ref >60)
GFR calc non Af Amer: 58 mL/min — ABNORMAL LOW (ref >60)
Glucose, Bld: 115 mg/dL — ABNORMAL HIGH (ref 70–99)
Potassium: 2.6 mmol/L — CL (ref 3.5–5.1)
Sodium: 136 mmol/L (ref 135–145)
Total Bilirubin: 1.3 mg/dL — ABNORMAL HIGH (ref 0.3–1.2)
Total Protein: 6.5 g/dL (ref 6.5–8.1)

## 2019-04-08 LAB — POCT I-STAT EG7
Acid-base deficit: 2 mmol/L (ref 0.0–2.0)
Bicarbonate: 23.3 mmol/L (ref 20.0–28.0)
Calcium, Ion: 1.04 mmol/L — ABNORMAL LOW (ref 1.15–1.40)
HCT: 33 % — ABNORMAL LOW (ref 39.0–52.0)
Hemoglobin: 11.2 g/dL — ABNORMAL LOW (ref 13.0–17.0)
O2 Saturation: 74 %
Potassium: 2.5 mmol/L — CL (ref 3.5–5.1)
Sodium: 145 mmol/L (ref 135–145)
TCO2: 24 mmol/L (ref 22–32)
pCO2, Ven: 40.2 mmHg — ABNORMAL LOW (ref 44.0–60.0)
pH, Ven: 7.37 (ref 7.250–7.430)
pO2, Ven: 40 mmHg (ref 32.0–45.0)

## 2019-04-08 LAB — CRYPTOCOCCAL ANTIGEN, CSF: Crypto Ag: NEGATIVE

## 2019-04-08 LAB — CSF CELL COUNT WITH DIFFERENTIAL
RBC Count, CSF: 0 /mm3
Tube #: 1
WBC, CSF: 4 /mm3 (ref 0–5)

## 2019-04-08 LAB — BASIC METABOLIC PANEL
Anion gap: 9 (ref 5–15)
BUN: 10 mg/dL (ref 6–20)
CO2: 24 mmol/L (ref 22–32)
Calcium: 7.7 mg/dL — ABNORMAL LOW (ref 8.9–10.3)
Chloride: 110 mmol/L (ref 98–111)
Creatinine, Ser: 1.11 mg/dL (ref 0.61–1.24)
GFR calc Af Amer: >60 mL/min (ref >60)
GFR calc non Af Amer: >60 mL/min (ref >60)
Glucose, Bld: 151 mg/dL — ABNORMAL HIGH (ref 70–99)
Potassium: 2.5 mmol/L — CL (ref 3.5–5.1)
Sodium: 143 mmol/L (ref 135–145)

## 2019-04-08 LAB — ETHANOL: Alcohol, Ethyl (B): <10 mg/dL (ref <10)

## 2019-04-08 LAB — SARS CORONAVIRUS 2 BY RT PCR (HOSPITAL ORDER, PERFORMED IN ~~LOC~~ HOSPITAL LAB): SARS Coronavirus 2: NEGATIVE

## 2019-04-08 LAB — CREATININE, SERUM
Creatinine, Ser: 1.16 mg/dL (ref 0.61–1.24)
GFR calc Af Amer: >60 mL/min (ref >60)
GFR calc non Af Amer: >60 mL/min (ref >60)

## 2019-04-08 LAB — AMMONIA: Ammonia: 68 umol/L — ABNORMAL HIGH (ref 9–35)

## 2019-04-08 LAB — PROTEIN, CSF: Total  Protein, CSF: 50 mg/dL — ABNORMAL HIGH (ref 15–45)

## 2019-04-08 LAB — PROTIME-INR
INR: 1.2 (ref 0.8–1.2)
Prothrombin Time: 15.5 seconds — ABNORMAL HIGH (ref 11.4–15.2)

## 2019-04-08 LAB — CBG MONITORING, ED: Glucose-Capillary: 107 mg/dL — ABNORMAL HIGH (ref 70–99)

## 2019-04-08 MED ORDER — FOLIC ACID 1 MG PO TABS
1.0000 mg | ORAL_TABLET | Freq: Every day | ORAL | Status: DC
  Administered 2019-04-08 – 2019-04-12 (×5): 1 mg via ORAL
  Filled 2019-04-08 (×5): qty 1

## 2019-04-08 MED ORDER — LIDOCAINE HCL 2 % IJ SOLN
20.0000 mL | Freq: Once | INTRAMUSCULAR | Status: AC
  Administered 2019-04-08: 400 mg
  Filled 2019-04-08: qty 20

## 2019-04-08 MED ORDER — POTASSIUM CHLORIDE CRYS ER 20 MEQ PO TBCR
40.0000 meq | EXTENDED_RELEASE_TABLET | Freq: Two times a day (BID) | ORAL | Status: DC
  Administered 2019-04-08: 40 meq via ORAL
  Filled 2019-04-08: qty 2

## 2019-04-08 MED ORDER — ENOXAPARIN SODIUM 80 MG/0.8ML ~~LOC~~ SOLN
70.0000 mg | SUBCUTANEOUS | Status: DC
  Administered 2019-04-08: 70 mg via SUBCUTANEOUS
  Filled 2019-04-08: qty 0.7

## 2019-04-08 MED ORDER — POTASSIUM CHLORIDE CRYS ER 20 MEQ PO TBCR
40.0000 meq | EXTENDED_RELEASE_TABLET | Freq: Once | ORAL | Status: AC
  Administered 2019-04-08: 40 meq via ORAL
  Filled 2019-04-08: qty 2

## 2019-04-08 MED ORDER — LACTULOSE 10 GM/15ML PO SOLN: 10.0000 g | Freq: Two times a day (BID) | ORAL | Status: DC

## 2019-04-08 MED ORDER — ENOXAPARIN SODIUM 80 MG/0.8ML ~~LOC~~ SOLN
0.5000 mg/kg | SUBCUTANEOUS | Status: DC
  Administered 2019-04-09 – 2019-04-12 (×4): 80 mg via SUBCUTANEOUS
  Filled 2019-04-08 (×4): qty 0.8

## 2019-04-08 MED ORDER — THIAMINE HCL 100 MG/ML IJ SOLN
Freq: Once | INTRAVENOUS | Status: AC
  Administered 2019-04-08: 11:00:00 via INTRAVENOUS
  Filled 2019-04-08: qty 1000

## 2019-04-08 MED ORDER — POTASSIUM CHLORIDE 10 MEQ/100ML IV SOLN
10.0000 meq | INTRAVENOUS | Status: AC
  Administered 2019-04-08 (×2): 10 meq via INTRAVENOUS
  Filled 2019-04-08 (×2): qty 100

## 2019-04-08 MED ORDER — SODIUM CHLORIDE 0.9% FLUSH
3.0000 mL | Freq: Once | INTRAVENOUS | Status: AC
  Administered 2019-04-08: 3 mL via INTRAVENOUS

## 2019-04-08 MED ORDER — ONDANSETRON HCL 4 MG/2ML IJ SOLN: 4.0000 mg | Freq: Four times a day (QID) | INTRAMUSCULAR | Status: DC | PRN

## 2019-04-08 MED ORDER — ONDANSETRON HCL 4 MG PO TABS: 4.0000 mg | ORAL_TABLET | Freq: Four times a day (QID) | ORAL | Status: DC | PRN

## 2019-04-08 MED ORDER — VITAMIN B-1 100 MG PO TABS
100.0000 mg | ORAL_TABLET | Freq: Every day | ORAL | Status: DC
  Administered 2019-04-08 – 2019-04-12 (×5): 100 mg via ORAL
  Filled 2019-04-08 (×5): qty 1

## 2019-04-08 MED ORDER — POTASSIUM CHLORIDE CRYS ER 20 MEQ PO TBCR: 40.0000 meq | EXTENDED_RELEASE_TABLET | Freq: Two times a day (BID) | ORAL | Status: DC

## 2019-04-08 MED ORDER — THIAMINE HCL 100 MG/ML IJ SOLN
100.0000 mg | Freq: Once | INTRAMUSCULAR | Status: AC
  Administered 2019-04-08: 100 mg via INTRAVENOUS
  Filled 2019-04-08: qty 2

## 2019-04-08 MED ORDER — LACTULOSE 10 GM/15ML PO SOLN
20.0000 g | Freq: Four times a day (QID) | ORAL | Status: DC
  Administered 2019-04-08 – 2019-04-12 (×14): 20 g via ORAL
  Filled 2019-04-08 (×14): qty 30

## 2019-04-08 MED ORDER — EMTRICITABINE-TENOFOVIR AF 200-25 MG PO TABS
1.0000 | ORAL_TABLET | Freq: Every day | ORAL | Status: DC
  Administered 2019-04-08 – 2019-04-12 (×5): 1 via ORAL
  Filled 2019-04-08 (×5): qty 1

## 2019-04-08 MED ORDER — TAB-A-VITE/IRON PO TABS
1.0000 | ORAL_TABLET | Freq: Every day | ORAL | Status: DC
  Administered 2019-04-08 – 2019-04-12 (×5): 1 via ORAL
  Filled 2019-04-08 (×5): qty 1

## 2019-04-08 MED ORDER — DORAVIRINE 100 MG PO TABS
100.0000 mg | ORAL_TABLET | Freq: Every day | ORAL | Status: DC
  Administered 2019-04-08 – 2019-04-12 (×5): 100 mg via ORAL
  Filled 2019-04-08 (×5): qty 1

## 2019-04-08 NOTE — ED Notes (Signed)
This RN called & updated pt's sister-in-law Ciel Arrendondo with pt's permission. His brother, Taite Malin gave his contact information to call him for anything concerning his brother [phone].
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
## 2019-04-08 NOTE — ED Notes (Signed)
Patient transported to CT 

## 2019-04-08 NOTE — ED Notes (Signed)
Regular lunch tray ordered 

## 2019-04-08 NOTE — ED Notes (Signed)
MD notified of critical lab: K+ 2.5

## 2019-04-08 NOTE — Progress Notes (Signed)
PROGRESS NOTE    [name]  [id number] DOB: 1965/10/05 DOA: 04/08/2019 PCP: Greig Right, MD   Brief Narrative:  [name] is [name] 53 y.o. male with medical history significant of alcohol abuse with cirrhosis, morbid obesity, HIV disease, GERD, hyperlipidemia, hypertension, with chronic alcoholism who has been sick apparently for the last couple of days.  Mainly confusion.  Patient has progressively getting weak and losing his balance.  He had known history of Korsakoff psychosis Kaylub Detienne while back but has improved.  He had [name] fall 2 nights and was brought in by ambulance confused.  Patient has had full work-up in the ER with no obvious cause.  No infectious source so far identified.  His ammonia level is 68.  He has not been on any lactulose.  He lives alone.  At this point suspicion is for possible hepatic encephalopathy or other causes of metabolic encephalopathy.  His HIV disease is under control and is taking his medications.  Patient slowly waking up mental status slowly improving but not back to baseline..  ED Course: Temperature 98.1 blood pressure 140/75 pulse 140 respiratory 20 oxygen sats 100% on room air.  Urine drug screen is negative.  Sodium 136 potassium 2.6 chloride 99 CO2 24 glucose 115 BUN 17 creatinine 1.37.  Ammonia level 68 total bili 1.3.  White count 4.6 with hemoglobin 12.7 and platelets of 88.  PT 15.5 INR 1.2.  Urinalysis negative.  LP for form showed no significant abnormalities.  Results are currently pending.  Assessment & Plan:   Principal Problem:   AMS (altered mental status) Active Problems:   Human immunodeficiency virus (HIV) disease ([place])   Anxiety state   Depression   Essential hypertension   Alcoholic cirrhosis of liver (HCC)   Hypokalemia   Hepatic encephalopathy (HCC)   #1 Acute Metabolic Encephalopathy: Suspected hepatic encephalopathy.  Pt with asterixis on exam and elevated ammonia.  He's [name]&Ox2-3 for me today. Follow [facility] Needs  paracentesis to r/o SBP - will hold off on abx given nontoxic CSF did not suggest meningitis (WBC 4, glucose wnl, total protein was slightly elevated).  Negative cryptococcal Ag.  Follow CSF cx. UA without evidence of infection. Continue lactulose QID Paracentesis ordered  #2 history of alcoholism: Patient denies any recent intake.  We will still watch him for possible withdrawal and if this happens we will initiate CIWA protocol.  Notes last drink was in April.  Thiamine, folate, mvi  #3 HIV disease: Continue descovy, pifeltro.    #4 anxiety with depression: Continue home regimen.  #5 hypokalemia: Most likely secondary to poor oral intake.  Replete potassium Follow repeat K this PM Follow mag  #6 hypertension: Continue home regimen.  #7 thrombocytopenia: Secondary to liver cirrhosis.  Continue to monitor platelets  #8 alcoholic cirrhosis: Patient is interested in liver transplant referral.  Encouraged to have GI follow-up first at discharge Holding amiloride and lasix for now  # Recurrent Falls: likely 2/2 hepatic encephalopathy.  Treat with lactulose.  PT eval.  DVT prophylaxis: lovenox Code Status: full  Family Communication: none at bedside Disposition Plan: pending further improvemetn   Consultants:   none  Procedures:  LP 10/24  Antimicrobials: Anti-infectives (From admission, onward)   Start     Dose/Rate Route Frequency Ordered Stop   04/08/19 1730  emtricitabine-tenofovir AF (DESCOVY) 200-25 MG per tablet 1 tablet     1 tablet Oral Daily 04/08/19 1726     04/08/19 1730  doravirine (PIFELTRO) tablet 100 mg  Note to Pharmacy: Refill with Descovy     100 mg Oral Daily 04/08/19 1726        Subjective: Came due to falls and AMS [name]&Ox2-3 (says [place], but knows [facility], October)  Objective: Vitals:   04/08/19 1130 04/08/19 1145 04/08/19 1200 04/08/19 1445  BP: (!) 98/47 (!) 100/46 (!) 94/52 (!) 144/67  Pulse: 65 65 66 69  Resp: 17 16 16 17   Temp:       TempSrc:      SpO2: 96% 96% 97% 98%  Weight:      Height:        Intake/Output Summary (Last 24 hours) at 04/08/2019 1658 Last data filed at 04/08/2019 0519 Gross per 24 hour  Intake 200 ml  Output -  Net 200 ml   Filed Weights   04/08/19 0026  Weight: (!) 156.5 kg    Examination:  General exam: Appears calm and comfortable  Respiratory system: Clear to auscultation. Respiratory effort normal. Cardiovascular system: S1 & S2 heard, RRR.  Gastrointestinal system: Abdomen is protuberant, soft, nontender Central nervous system: Alert and oriented x 2-3. No focal neurological deficits. Extremities: Symmetric 5 x 5 power. Skin: No rashes, lesions or ulcers Psychiatry: Judgement and insight appear normal. Mood & affect appropriate.     Data Reviewed: I have personally reviewed following labs and imaging studies  CBC: Recent Labs  Lab 04/08/19 0044 04/08/19 1057  WBC 4.6 3.9*  HGB 12.7* 12.7*  HCT 38.3* 39.2  MCV 85.1 86.0  PLT 88* 96*   Basic Metabolic Panel: Recent Labs  Lab 04/08/19 0044 04/08/19 1057  NA 136  --   K 2.6*  --   CL 99  --   CO2 24  --   GLUCOSE 115*  --   BUN 17  --   CREATININE 1.37* 1.16  CALCIUM 9.1  --    GFR: Estimated Creatinine Clearance: 112.4 mL/min (by C-G formula based on SCr of 1.16 mg/dL). Liver Function Tests: Recent Labs  Lab 04/08/19 0044  AST 43*  ALT 25  ALKPHOS 89  BILITOT 1.3*  PROT 6.5  ALBUMIN 3.8   No results for input(s): LIPASE, AMYLASE in the last 168 hours. Recent Labs  Lab 04/08/19 0045  AMMONIA 68*   Coagulation Profile: Recent Labs  Lab 04/08/19 0045  INR 1.2   Cardiac Enzymes: No results for input(s): CKTOTAL, CKMB, CKMBINDEX, TROPONINI in the last 168 hours. BNP (last 3 results) No results for input(s): PROBNP in the last 8760 hours. HbA1C: No results for input(s): HGBA1C in the last 72 hours. CBG: Recent Labs  Lab 04/08/19 0130  GLUCAP 107*   Lipid Profile: No results for  input(s): CHOL, HDL, LDLCALC, TRIG, CHOLHDL, LDLDIRECT in the last 72 hours. Thyroid Function Tests: No results for input(s): TSH, T4TOTAL, FREET4, T3FREE, THYROIDAB in the last 72 hours. Anemia Panel: No results for input(s): VITAMINB12, FOLATE, FERRITIN, TIBC, IRON, RETICCTPCT in the last 72 hours. Sepsis Labs: No results for input(s): PROCALCITON, LATICACIDVEN in the last 168 hours.  Recent Results (from the past 240 hour(s))  SARS Coronavirus 2 by RT PCR (hospital order, performed in [facility] hospital lab) Nasopharyngeal Urine, Clean Catch     Status: None   Collection Time: 04/08/19  2:48 AM   Specimen: Urine, Clean Catch; Nasopharyngeal  Result Value Ref Range Status   SARS Coronavirus 2 NEGATIVE NEGATIVE Final    Comment: (NOTE) If result is NEGATIVE SARS-CoV-2 target nucleic acids are NOT DETECTED. The SARS-CoV-2 RNA  is generally detectable in upper and lower  respiratory specimens during the acute phase of infection. The lowest  concentration of SARS-CoV-2 viral copies this assay can detect is 250  copies / mL. Parley Pidcock negative result does not preclude SARS-CoV-2 infection  and should not be used as the sole basis for treatment or other  patient management decisions.  Perry Brucato negative result may occur with  improper specimen collection / handling, submission of specimen other  than nasopharyngeal swab, presence of viral mutation(s) within the  areas targeted by this assay, and inadequate number of viral copies  (<250 copies / mL). Dameer Speiser negative result must be combined with clinical  observations, patient history, and epidemiological information. If result is POSITIVE SARS-CoV-2 target nucleic acids are DETECTED. The SARS-CoV-2 RNA is generally detectable in upper and lower  respiratory specimens dur ing the acute phase of infection.  Positive  results are indicative of active infection with SARS-CoV-2.  Clinical  correlation with patient history and other diagnostic information is   necessary to determine patient infection status.  Positive results do  not rule out bacterial infection or co-infection with other viruses. If result is PRESUMPTIVE POSTIVE SARS-CoV-2 nucleic acids MAY BE PRESENT.   Kinzlie Harney presumptive positive result was obtained on the submitted specimen  and confirmed on repeat testing.  While 2019 novel coronavirus  (SARS-CoV-2) nucleic acids may be present in the submitted sample  additional confirmatory testing may be necessary for epidemiological  and / or clinical management purposes  to differentiate between  SARS-CoV-2 and other Sarbecovirus currently known to infect humans.  If clinically indicated additional testing with an alternate test  methodology [phone]) is advised. The SARS-CoV-2 RNA is generally  detectable in upper and lower respiratory sp ecimens during the acute  phase of infection. The expected result is Negative. Fact Sheet for Patients:  StrictlyIdeas.no Fact Sheet for Healthcare Providers: BankingDealers.co.za This test is not yet approved or cleared by the [place] FDA and has been authorized for detection and/or diagnosis of SARS-CoV-2 by FDA under an Emergency Use Authorization (EUA).  This EUA will remain in effect (meaning this test can be used) for the duration of the COVID-19 declaration under Section 564(b)(1) of the Act, 21 U.S.C. section 360bbb-3(b)(1), unless the authorization is terminated or revoked sooner. Performed at [facility], [place] [address] [place], [place] [address]   CSF culture     Status: None (Preliminary result)   Collection Time: 04/08/19  4:45 AM   Specimen: CSF; Cerebrospinal Fluid  Result Value Ref Range Status   Specimen Description CSF  Final   Special Requests NONE  Final   Gram Stain   Final    WBC PRESENT, PREDOMINANTLY MONONUCLEAR NO ORGANISMS SEEN CYTOSPIN SMEAR Performed at [facility], [place] [address], [place],  [place] [address]    Culture PENDING  Incomplete   Report Status PENDING  Incomplete         Radiology Studies: Ct Head Wo Contrast  Result Date: 04/08/2019 CLINICAL DATA:  Change in mental status, fall EXAM: CT HEAD WITHOUT CONTRAST TECHNIQUE: Contiguous axial images were obtained from the base of the skull through the vertex without intravenous contrast. COMPARISON:  October 02, 2017 FINDINGS: Brain: No evidence of acute territorial infarction, hemorrhage, hydrocephalus,extra-axial collection or mass lesion/mass effect. Normal gray-white differentiation. Ventricles are normal in size and contour. Vascular: No hyperdense vessel or unexpected calcification. Skull: The skull is intact. No fracture or focal lesion identified. Sinuses/Orbits: The visualized paranasal sinuses  and mastoid air cells are clear. The orbits and globes intact. Other: None Cervical spine: Alignment: Physiologic Skull base and vertebrae: Visualized skull base is intact. No atlanto-occipital dissociation. The vertebral body heights are well maintained. No fracture or pathologic osseous lesion seen. Soft tissues and spinal canal: The visualized paraspinal soft tissues are unremarkable. No prevertebral soft tissue swelling is seen. The spinal canal is grossly unremarkable, no large epidural collection or significant canal narrowing. Disc levels: No significant canal or neural foraminal narrowing. There is mild disc height loss with anterior osteophytes seen at C5-C6 and C6-C7. Alani Sabbagh Upper chest: The lung apices are clear. Thoracic inlet is within normal limits. Other: None IMPRESSION: No acute intracranial abnormality. No acute fracture or malalignment of the spine. Electronically Signed   By: Prudencio Pair M.D.   On: 04/08/2019 02:10   Ct Cervical Spine Wo Contrast  Result Date: 04/08/2019 CLINICAL DATA:  Change in mental status, fall EXAM: CT HEAD WITHOUT CONTRAST TECHNIQUE: Contiguous axial images were obtained from the base of the skull  through the vertex without intravenous contrast. COMPARISON:  October 02, 2017 FINDINGS: Brain: No evidence of acute territorial infarction, hemorrhage, hydrocephalus,extra-axial collection or mass lesion/mass effect. Normal gray-white differentiation. Ventricles are normal in size and contour. Vascular: No hyperdense vessel or unexpected calcification. Skull: The skull is intact. No fracture or focal lesion identified. Sinuses/Orbits: The visualized paranasal sinuses and mastoid air cells are clear. The orbits and globes intact. Other: None Cervical spine: Alignment: Physiologic Skull base and vertebrae: Visualized skull base is intact. No atlanto-occipital dissociation. The vertebral body heights are well maintained. No fracture or pathologic osseous lesion seen. Soft tissues and spinal canal: The visualized paraspinal soft tissues are unremarkable. No prevertebral soft tissue swelling is seen. The spinal canal is grossly unremarkable, no large epidural collection or significant canal narrowing. Disc levels: No significant canal or neural foraminal narrowing. There is mild disc height loss with anterior osteophytes seen at C5-C6 and C6-C7. Lilo Wallington Upper chest: The lung apices are clear. Thoracic inlet is within normal limits. Other: None IMPRESSION: No acute intracranial abnormality. No acute fracture or malalignment of the spine. Electronically Signed   By: Prudencio Pair M.D.   On: 04/08/2019 02:10   Dg Chest Port 1 View  Result Date: 04/08/2019 CLINICAL DATA:  53 year old male with fall. Altered mental status. EXAM: PORTABLE CHEST 1 VIEW COMPARISON:  Chest radiograph dated 05/03/2011 FINDINGS: There is cardiomegaly. No focal consolidation, pleural effusion, or pneumothorax. No acute osseous pathology. IMPRESSION: Cardiomegaly. No focal consolidation. Electronically Signed   By: Anner Crete M.D.   On: 04/08/2019 01:40   Dg Shoulder Left Portable  Result Date: 04/08/2019 CLINICAL DATA:  Fall, pain EXAM: LEFT  SHOULDER - 1 VIEW COMPARISON:  None. FINDINGS: There is Authur Cubit fracture deformity of the left humeral head corresponding to the prior fracture in 2018. Surrounding bony callus formation is seen. No definite new fracture is identified. IMPRESSION: Healed fracture deformity of the humeral head. No definite acute fracture. Electronically Signed   By: Prudencio Pair M.D.   On: 04/08/2019 01:40        Scheduled Meds: . [START ON 04/09/2019] enoxaparin (LOVENOX) injection  0.5 mg/kg Subcutaneous Q24H  . lactulose  20 g Oral QID   Continuous Infusions:   LOS: 0 days    Time spent: over 30 min    Fayrene Helper, MD [facility] Pager AMION  If 7PM-7AM, please contact night-coverage [email or web address] Password [facility] 04/08/2019, 4:58 PM

## 2019-04-08 NOTE — ED Notes (Signed)
Dr. Betsey Holiday aware of K+ of 2.6.

## 2019-04-08 NOTE — ED Triage Notes (Signed)
Pt has had AMS intermittently x three days.  Pt lives alone.  Had multiple falls yesterday.  Hit back of head but denies LOC.  Pt A&O x 3. Disoriented to place.  Stated he was at [facility].

## 2019-04-08 NOTE — ED Notes (Signed)
Please call the family back to update them on patient's condition. Rodney Langton [phone]
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
## 2019-04-09 ENCOUNTER — Inpatient Hospital Stay (HOSPITAL_COMMUNITY): Payer: Medicaid Other

## 2019-04-09 DIAGNOSIS — R4182 Altered mental status, unspecified: Secondary | ICD-10-CM | POA: Diagnosis not present

## 2019-04-09 LAB — CBC
HCT: 37 % — ABNORMAL LOW (ref 39.0–52.0)
Hemoglobin: 12 g/dL — ABNORMAL LOW (ref 13.0–17.0)
MCH: 27.9 pg (ref 26.0–34.0)
MCHC: 32.4 g/dL (ref 30.0–36.0)
MCV: 86 fL (ref 80.0–100.0)
Platelets: 81 10*3/uL — ABNORMAL LOW (ref 150–400)
RBC: 4.3 MIL/uL (ref 4.22–5.81)
RDW: 16.4 % — ABNORMAL HIGH (ref 11.5–15.5)
WBC: 3.1 10*3/uL — ABNORMAL LOW (ref 4.0–10.5)
nRBC: 0 % (ref 0.0–0.2)

## 2019-04-09 LAB — COMPREHENSIVE METABOLIC PANEL
ALT: 25 U/L (ref 0–44)
AST: 41 U/L (ref 15–41)
Albumin: 3.2 g/dL — ABNORMAL LOW (ref 3.5–5.0)
Alkaline Phosphatase: 78 U/L (ref 38–126)
Anion gap: 9 (ref 5–15)
BUN: 10 mg/dL (ref 6–20)
CO2: 26 mmol/L (ref 22–32)
Calcium: 8.7 mg/dL — ABNORMAL LOW (ref 8.9–10.3)
Chloride: 107 mmol/L (ref 98–111)
Creatinine, Ser: 1.04 mg/dL (ref 0.61–1.24)
GFR calc Af Amer: >60 mL/min (ref >60)
GFR calc non Af Amer: >60 mL/min (ref >60)
Glucose, Bld: 124 mg/dL — ABNORMAL HIGH (ref 70–99)
Potassium: 3.1 mmol/L — ABNORMAL LOW (ref 3.5–5.1)
Sodium: 142 mmol/L (ref 135–145)
Total Bilirubin: 1.3 mg/dL — ABNORMAL HIGH (ref 0.3–1.2)
Total Protein: 5.9 g/dL — ABNORMAL LOW (ref 6.5–8.1)

## 2019-04-09 LAB — MAGNESIUM: Magnesium: 2 mg/dL (ref 1.7–2.4)

## 2019-04-09 LAB — HSV DNA BY PCR (REFERENCE LAB)
HSV 1 DNA: NEGATIVE
HSV 2 DNA: NEGATIVE

## 2019-04-09 LAB — VDRL, CSF: VDRL Quant, CSF: NONREACTIVE

## 2019-04-09 MED ORDER — POTASSIUM CHLORIDE CRYS ER 20 MEQ PO TBCR: 40.0000 meq | EXTENDED_RELEASE_TABLET | ORAL | Status: DC

## 2019-04-09 MED ORDER — ACETAMINOPHEN 500 MG PO TABS
500.0000 mg | ORAL_TABLET | Freq: Once | ORAL | Status: AC | PRN
  Administered 2019-04-10: 500 mg via ORAL
  Filled 2019-04-09: qty 1

## 2019-04-09 MED ORDER — LIDOCAINE HCL 1 % IJ SOLN
INTRAMUSCULAR | Status: AC
  Filled 2019-04-09: qty 20

## 2019-04-09 MED ORDER — POTASSIUM CHLORIDE CRYS ER 20 MEQ PO TBCR
40.0000 meq | EXTENDED_RELEASE_TABLET | ORAL | Status: AC
  Administered 2019-04-09 (×2): 40 meq via ORAL
  Filled 2019-04-09 (×2): qty 2
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
## 2019-04-09 NOTE — Plan of Care (Signed)
  Problem: Education: Goal: Knowledge of General Education information will improve Description: Including pain rating scale, medication(s)/side effects and non-pharmacologic comfort measures Outcome: Progressing   Problem: Health Behavior/Discharge Planning: Goal: Ability to manage health-related needs will improve Outcome: Progressing   Problem: Clinical Measurements: Goal: Ability to maintain clinical measurements within normal limits will improve Outcome: Progressing Goal: Will remain free from infection Outcome: Progressing Goal: Diagnostic test results will improve Outcome: Progressing Goal: Respiratory complications will improve Outcome: Progressing Goal: Cardiovascular complication will be avoided Outcome: Progressing   Problem: Coping: Goal: Level of anxiety will decrease Outcome: Progressing   Problem: Activity: Goal: Risk for activity intolerance will decrease Outcome: Progressing

## 2019-04-09 NOTE — Progress Notes (Signed)
Patient brought to radiology for possible paracentesis.  Limited ultrasound abdomen reveals no fluid.  No procedure performed.  Patient returned to unit.  Images saved for review.  Brynda Greathouse, MS RD PA-C

## 2019-04-09 NOTE — Progress Notes (Signed)
PROGRESS NOTE    [name]  [id number] DOB: 03-03-1966 DOA: 04/08/2019 PCP: Greig Right, MD   Brief Narrative:  [name] is [name] 53 y.o. male with medical history significant of alcohol abuse with cirrhosis, morbid obesity, HIV disease, GERD, hyperlipidemia, hypertension, with chronic alcoholism who has been sick apparently for the last couple of days.  Mainly confusion.  Patient has progressively getting weak and losing his balance.  He had known history of Korsakoff psychosis [name] while back but has improved.  He had [name] fall 2 nights and was brought in by ambulance confused.  Patient has had full work-up in the ER with no obvious cause.  No infectious source so far identified.  His ammonia level is 68.  He has not been on any lactulose.  He lives alone.  At this point suspicion is for possible hepatic encephalopathy or other causes of metabolic encephalopathy.  His HIV disease is under control and is taking his medications.  Patient slowly waking up mental status slowly improving but not back to baseline..  ED Course: Temperature 98.1 blood pressure 140/75 pulse 140 respiratory 20 oxygen sats 100% on room air.  Urine drug screen is negative.  Sodium 136 potassium 2.6 chloride 99 CO2 24 glucose 115 BUN 17 creatinine 1.37.  Ammonia level 68 total bili 1.3.  White count 4.6 with hemoglobin 12.7 and platelets of 88.  PT 15.5 INR 1.2.  Urinalysis negative.  LP for form showed no significant abnormalities.  Results are currently pending.  Assessment & Plan:   Principal Problem:   AMS (altered mental status) Active Problems:   Human immunodeficiency virus (HIV) disease ([place])   Anxiety state   Depression   Essential hypertension   Alcoholic cirrhosis of liver (HCC)   Hypokalemia   Hepatic encephalopathy (HCC)   #1 Acute Metabolic Encephalopathy: Suspected hepatic encephalopathy.  Pt with asterixis on exam and elevated ammonia.  He's [name]&Ox2-3 for me today. Follow [facility] Paracentesis  without fluid  CSF did not suggest meningitis (WBC 4, glucose wnl, total protein was slightly elevated).  Negative cryptococcal Ag.  Follow CSF cx. UA without evidence of infection. Continue lactulose QID - appears improved today, continued asterixis today though, continue  Paracentesis ordered  #2 history of alcoholism: Patient denies any recent intake.  We will still watch him for possible withdrawal and if this happens we will initiate CIWA protocol.  Notes last drink was in April.  Thiamine, folate, mvi  #3 HIV disease: Continue descovy, pifeltro.    #4 anxiety with depression: Continue home regimen.  #5 hypokalemia: Most likely secondary to poor oral intake.  Replete potassium Replace and follow  #6 hypertension: Continue home regimen.  #7 thrombocytopenia: Secondary to liver cirrhosis.  Continue to monitor platelets  #8 alcoholic cirrhosis: Patient is interested in liver transplant referral.  Encouraged to have GI follow-up first at discharge Holding amiloride and lasix for now  # Recurrent Falls: likely 2/2 hepatic encephalopathy.  Treat with lactulose.  PT eval.  DVT prophylaxis: lovenox Code Status: full  Family Communication: none at bedside Disposition Plan: pending further improvemetn - PT recommending SNF, will look into SNF placement   Consultants:   none  Procedures:  LP 10/24  Antimicrobials: Anti-infectives (From admission, onward)   Start     Dose/Rate Route Frequency Ordered Stop   04/08/19 1830  emtricitabine-tenofovir AF (DESCOVY) 200-25 MG per tablet 1 tablet     1 tablet Oral Daily 04/08/19 1726     04/08/19 1730  doravirine (PIFELTRO) tablet 100 mg    Note to Pharmacy: Refill with Descovy     100 mg Oral Daily 04/08/19 1726        Subjective: [name]&Ox3 today Feeling [name] little better  Objective: Vitals:   04/09/19 0118 04/09/19 0313 04/09/19 0957 04/09/19 1345  BP: (!) 148/75 (!) 126/57 (!) 102/57 133/62  Pulse: 67 73 70   Resp: 20 19  20    Temp: 97.8 F (36.6 C) 99 F (37.2 C) 98.1 F (36.7 C)   TempSrc: Oral Oral Oral   SpO2: 98% 99% 97%   Weight: (!) 155.1 kg     Height: 6' (1.829 m)       Intake/Output Summary (Last 24 hours) at 04/09/2019 1511 Last data filed at 04/09/2019 1424 Gross per 24 hour  Intake 2080 ml  Output --  Net 2080 ml   Filed Weights   04/08/19 0026 04/09/19 0118  Weight: (!) 156.5 kg (!) 155.1 kg    Examination:  General: No acute distress. Cardiovascular: Heart sounds show [name] regular rate, and rhythm. Lungs: Clear to auscultation bilaterally  Abdomen: Soft, nontender, nondistended  Neurological: Alert and oriented 3. Moves all extremities 4. Cranial nerves II through XII grossly intact. Asterixis, improved.  Skin: Warm and dry. No rashes or lesions. Extremities: No clubbing or cyanosis. No edema.   Data Reviewed: I have personally reviewed following labs and imaging studies  CBC: Recent Labs  Lab 04/08/19 0044 04/08/19 1057 04/08/19 2005 04/09/19 0545  WBC 4.6 3.9*  --  3.1*  HGB 12.7* 12.7* 11.2* 12.0*  HCT 38.3* 39.2 33.0* 37.0*  MCV 85.1 86.0  --  86.0  PLT 88* 96*  --  81*   Basic Metabolic Panel: Recent Labs  Lab 04/08/19 0044 04/08/19 1057 04/08/19 1958 04/08/19 2005 04/09/19 0545  NA 136  --  143 145 142  K 2.6*  --  2.5* 2.5* 3.1*  CL 99  --  110  --  107  CO2 24  --  24  --  26  GLUCOSE 115*  --  151*  --  124*  BUN 17  --  10  --  10  CREATININE 1.37* 1.16 1.11  --  1.04  CALCIUM 9.1  --  7.7*  --  8.7*  MG  --   --   --   --  2.0   GFR: Estimated Creatinine Clearance: 124.7 mL/min (by C-G formula based on SCr of 1.04 mg/dL). Liver Function Tests: Recent Labs  Lab 04/08/19 0044 04/09/19 0545  AST 43* 41  ALT 25 25  ALKPHOS 89 78  BILITOT 1.3* 1.3*  PROT 6.5 5.9*  ALBUMIN 3.8 3.2*   No results for input(s): LIPASE, AMYLASE in the last 168 hours. Recent Labs  Lab 04/08/19 0045  AMMONIA 68*   Coagulation Profile: Recent Labs  Lab  04/08/19 0045  INR 1.2   Cardiac Enzymes: No results for input(s): CKTOTAL, CKMB, CKMBINDEX, TROPONINI in the last 168 hours. BNP (last 3 results) No results for input(s): PROBNP in the last 8760 hours. HbA1C: No results for input(s): HGBA1C in the last 72 hours. CBG: Recent Labs  Lab 04/08/19 0130  GLUCAP 107*   Lipid Profile: No results for input(s): CHOL, HDL, LDLCALC, TRIG, CHOLHDL, LDLDIRECT in the last 72 hours. Thyroid Function Tests: No results for input(s): TSH, T4TOTAL, FREET4, T3FREE, THYROIDAB in the last 72 hours. Anemia Panel: No results for input(s): VITAMINB12, FOLATE, FERRITIN, TIBC, IRON, RETICCTPCT in the last 72  hours. Sepsis Labs: No results for input(s): PROCALCITON, LATICACIDVEN in the last 168 hours.  Recent Results (from the past 240 hour(s))  SARS Coronavirus 2 by RT PCR (hospital order, performed in [facility] hospital lab) Nasopharyngeal Urine, Clean Catch     Status: None   Collection Time: 04/08/19  2:48 AM   Specimen: Urine, Clean Catch; Nasopharyngeal  Result Value Ref Range Status   SARS Coronavirus 2 NEGATIVE NEGATIVE Final    Comment: (NOTE) If result is NEGATIVE SARS-CoV-2 target nucleic acids are NOT DETECTED. The SARS-CoV-2 RNA is generally detectable in upper and lower  respiratory specimens during the acute phase of infection. The lowest  concentration of SARS-CoV-2 viral copies this assay can detect is 250  copies / mL. Trent Gabler negative result does not preclude SARS-CoV-2 infection  and should not be used as the sole basis for treatment or other  patient management decisions.  Brax Walen negative result may occur with  improper specimen collection / handling, submission of specimen other  than nasopharyngeal swab, presence of viral mutation(s) within the  areas targeted by this assay, and inadequate number of viral copies  (<250 copies / mL). Jonee Lamore negative result must be combined with clinical  observations, patient history, and epidemiological  information. If result is POSITIVE SARS-CoV-2 target nucleic acids are DETECTED. The SARS-CoV-2 RNA is generally detectable in upper and lower  respiratory specimens dur ing the acute phase of infection.  Positive  results are indicative of active infection with SARS-CoV-2.  Clinical  correlation with patient history and other diagnostic information is  necessary to determine patient infection status.  Positive results do  not rule out bacterial infection or co-infection with other viruses. If result is PRESUMPTIVE POSTIVE SARS-CoV-2 nucleic acids MAY BE PRESENT.   Sahalie Beth presumptive positive result was obtained on the submitted specimen  and confirmed on repeat testing.  While 2019 novel coronavirus  (SARS-CoV-2) nucleic acids may be present in the submitted sample  additional confirmatory testing may be necessary for epidemiological  and / or clinical management purposes  to differentiate between  SARS-CoV-2 and other Sarbecovirus currently known to infect humans.  If clinically indicated additional testing with an alternate test  methodology [phone]) is advised. The SARS-CoV-2 RNA is generally  detectable in upper and lower respiratory sp ecimens during the acute  phase of infection. The expected result is Negative. Fact Sheet for Patients:  StrictlyIdeas.no Fact Sheet for Healthcare Providers: BankingDealers.co.za This test is not yet approved or cleared by the [place] FDA and has been authorized for detection and/or diagnosis of SARS-CoV-2 by FDA under an Emergency Use Authorization (EUA).  This EUA will remain in effect (meaning this test can be used) for the duration of the COVID-19 declaration under Section 564(b)(1) of the Act, 21 U.S.C. section 360bbb-3(b)(1), unless the authorization is terminated or revoked sooner. Performed at [facility], [place] [address], [place], [place] [address]   CSF culture     Status: None  (Preliminary result)   Collection Time: 04/08/19  4:45 AM   Specimen: CSF; Cerebrospinal Fluid  Result Value Ref Range Status   Specimen Description CSF  Final   Special Requests NONE  Final   Gram Stain   Final    WBC PRESENT, PREDOMINANTLY MONONUCLEAR NO ORGANISMS SEEN CYTOSPIN SMEAR    Culture   Final    NO GROWTH 1 DAY Performed at [facility], [place] [address], [place], [place] [address]    Report Status PENDING  Incomplete         Radiology Studies: Ct Head Wo Contrast  Result Date: 04/08/2019 CLINICAL DATA:  Change in mental status, fall EXAM: CT HEAD WITHOUT CONTRAST TECHNIQUE: Contiguous axial images were obtained from the base of the skull through the vertex without intravenous contrast. COMPARISON:  October 02, 2017 FINDINGS: Brain: No evidence of acute territorial infarction, hemorrhage, hydrocephalus,extra-axial collection or mass lesion/mass effect. Normal gray-white differentiation. Ventricles are normal in size and contour. Vascular: No hyperdense vessel or unexpected calcification. Skull: The skull is intact. No fracture or focal lesion identified. Sinuses/Orbits: The visualized paranasal sinuses and mastoid air cells are clear. The orbits and globes intact. Other: None Cervical spine: Alignment: Physiologic Skull base and vertebrae: Visualized skull base is intact. No atlanto-occipital dissociation. The vertebral body heights are well maintained. No fracture or pathologic osseous lesion seen. Soft tissues and spinal canal: The visualized paraspinal soft tissues are unremarkable. No prevertebral soft tissue swelling is seen. The spinal canal is grossly unremarkable, no large epidural collection or significant canal narrowing. Disc levels: No significant canal or neural foraminal narrowing. There is mild disc height loss with anterior osteophytes seen at C5-C6 and C6-C7. Ibraham Levi Upper chest: The lung apices are clear. Thoracic inlet is within normal limits. Other: None  IMPRESSION: No acute intracranial abnormality. No acute fracture or malalignment of the spine. Electronically Signed   By: Prudencio Pair M.D.   On: 04/08/2019 02:10   Ct Cervical Spine Wo Contrast  Result Date: 04/08/2019 CLINICAL DATA:  Change in mental status, fall EXAM: CT HEAD WITHOUT CONTRAST TECHNIQUE: Contiguous axial images were obtained from the base of the skull through the vertex without intravenous contrast. COMPARISON:  October 02, 2017 FINDINGS: Brain: No evidence of acute territorial infarction, hemorrhage, hydrocephalus,extra-axial collection or mass lesion/mass effect. Normal gray-white differentiation. Ventricles are normal in size and contour. Vascular: No hyperdense vessel or unexpected calcification. Skull: The skull is intact. No fracture or focal lesion identified. Sinuses/Orbits: The visualized paranasal sinuses and mastoid air cells are clear. The orbits and globes intact. Other: None Cervical spine: Alignment: Physiologic Skull base and vertebrae: Visualized skull base is intact. No atlanto-occipital dissociation. The vertebral body heights are well maintained. No fracture or pathologic osseous lesion seen. Soft tissues and spinal canal: The visualized paraspinal soft tissues are unremarkable. No prevertebral soft tissue swelling is seen. The spinal canal is grossly unremarkable, no large epidural collection or significant canal narrowing. Disc levels: No significant canal or neural foraminal narrowing. There is mild disc height loss with anterior osteophytes seen at C5-C6 and C6-C7. Jermiah Howton Upper chest: The lung apices are clear. Thoracic inlet is within normal limits. Other: None IMPRESSION: No acute intracranial abnormality. No acute fracture or malalignment of the spine. Electronically Signed   By: Prudencio Pair M.D.   On: 04/08/2019 02:10   Dg Chest Port 1 View  Result Date: 04/08/2019 CLINICAL DATA:  53 year old male with fall. Altered mental status. EXAM: PORTABLE CHEST 1 VIEW  COMPARISON:  Chest radiograph dated 05/03/2011 FINDINGS: There is cardiomegaly. No focal consolidation, pleural effusion, or pneumothorax. No acute osseous pathology. IMPRESSION: Cardiomegaly. No focal consolidation. Electronically Signed   By: Anner Crete M.D.   On: 04/08/2019 01:40   Dg Shoulder Left Portable  Result Date: 04/08/2019 CLINICAL DATA:  Fall, pain EXAM: LEFT SHOULDER - 1 VIEW COMPARISON:  None. FINDINGS: There is Janann Boeve fracture deformity of the left humeral head corresponding to the prior fracture in 2018. Surrounding bony callus formation is seen. No definite  new fracture is identified. IMPRESSION: Healed fracture deformity of the humeral head. No definite acute fracture. Electronically Signed   By: Prudencio Pair M.D.   On: 04/08/2019 01:40        Scheduled Meds:  doravirine  100 mg Oral Daily   emtricitabine-tenofovir AF  1 tablet Oral Daily   enoxaparin (LOVENOX) injection  0.5 mg/kg Subcutaneous [id number]   folic acid  1 mg Oral Daily   lactulose  20 g Oral QID   multivitamins with iron  1 tablet Oral Daily   thiamine  100 mg Oral Daily   Continuous Infusions:   LOS: 1 day    Time spent: over 30 min    Fayrene Helper, MD [facility] Pager AMION  If 7PM-7AM, please contact night-coverage [email or web address] Password [email or web address] 04/09/2019, 3:11 PM

## 2019-04-10 DIAGNOSIS — R4182 Altered mental status, unspecified: Secondary | ICD-10-CM | POA: Diagnosis not present

## 2019-04-10 LAB — COMPREHENSIVE METABOLIC PANEL
ALT: 24 U/L (ref 0–44)
AST: 37 U/L (ref 15–41)
Albumin: 3 g/dL — ABNORMAL LOW (ref 3.5–5.0)
Alkaline Phosphatase: 74 U/L (ref 38–126)
Anion gap: 10 (ref 5–15)
BUN: 10 mg/dL (ref 6–20)
CO2: 23 mmol/L (ref 22–32)
Calcium: 8.6 mg/dL — ABNORMAL LOW (ref 8.9–10.3)
Chloride: 108 mmol/L (ref 98–111)
Creatinine, Ser: 1.14 mg/dL (ref 0.61–1.24)
GFR calc Af Amer: >60 mL/min (ref >60)
GFR calc non Af Amer: >60 mL/min (ref >60)
Glucose, Bld: 114 mg/dL — ABNORMAL HIGH (ref 70–99)
Potassium: 3.6 mmol/L (ref 3.5–5.1)
Sodium: 141 mmol/L (ref 135–145)
Total Bilirubin: 0.9 mg/dL (ref 0.3–1.2)
Total Protein: 5.8 g/dL — ABNORMAL LOW (ref 6.5–8.1)

## 2019-04-10 LAB — CBC
HCT: 36.8 % — ABNORMAL LOW (ref 39.0–52.0)
Hemoglobin: 12 g/dL — ABNORMAL LOW (ref 13.0–17.0)
MCH: 28 pg (ref 26.0–34.0)
MCHC: 32.6 g/dL (ref 30.0–36.0)
MCV: 86 fL (ref 80.0–100.0)
Platelets: 86 10*3/uL — ABNORMAL LOW (ref 150–400)
RBC: 4.28 MIL/uL (ref 4.22–5.81)
RDW: 16.5 % — ABNORMAL HIGH (ref 11.5–15.5)
WBC: 2.9 10*3/uL — ABNORMAL LOW (ref 4.0–10.5)
nRBC: 0 % (ref 0.0–0.2)

## 2019-04-10 LAB — MAGNESIUM: Magnesium: 1.9 mg/dL (ref 1.7–2.4)

## 2019-04-10 MED ORDER — POTASSIUM CHLORIDE CRYS ER 20 MEQ PO TBCR
40.0000 meq | EXTENDED_RELEASE_TABLET | Freq: Two times a day (BID) | ORAL | Status: DC
  Administered 2019-04-10 – 2019-04-12 (×4): 40 meq via ORAL
  Filled 2019-04-10 (×4): qty 2

## 2019-04-10 MED ORDER — TRAZODONE HCL 50 MG PO TABS
50.0000 mg | ORAL_TABLET | Freq: Every evening | ORAL | Status: DC | PRN
  Administered 2019-04-10 (×2): 50 mg via ORAL
  Filled 2019-04-10 (×2): qty 1

## 2019-04-10 MED ORDER — AMILORIDE HCL 5 MG PO TABS
15.0000 mg | ORAL_TABLET | Freq: Every day | ORAL | Status: DC
  Administered 2019-04-10 – 2019-04-12 (×3): 15 mg via ORAL
  Filled 2019-04-10 (×3): qty 3

## 2019-04-10 MED ORDER — FUROSEMIDE 80 MG PO TABS
80.0000 mg | ORAL_TABLET | Freq: Two times a day (BID) | ORAL | Status: DC
  Administered 2019-04-10 – 2019-04-12 (×4): 80 mg via ORAL
  Filled 2019-04-10 (×4): qty 1

## 2019-04-10 NOTE — Progress Notes (Signed)
Rehab Admissions Coordinator Note:  Per PT recommendation, patient was screened by Michel Santee for appropriateness for an Inpatient Acute Rehab Consult.  At this time, we are recommending Inpatient Rehab consult.  Please place a consult order if pt would like to be considered.   Michel Santee 04/10/2019, 8:06 AM  I can be reached at [phone].
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
## 2019-04-11 DIAGNOSIS — R4182 Altered mental status, unspecified: Secondary | ICD-10-CM | POA: Diagnosis not present

## 2019-04-11 LAB — CBC
HCT: 39.1 % (ref 39.0–52.0)
Hemoglobin: 12.2 g/dL — ABNORMAL LOW (ref 13.0–17.0)
MCH: 27.3 pg (ref 26.0–34.0)
MCHC: 31.2 g/dL (ref 30.0–36.0)
MCV: 87.5 fL (ref 80.0–100.0)
Platelets: 96 10*3/uL — ABNORMAL LOW (ref 150–400)
RBC: 4.47 MIL/uL (ref 4.22–5.81)
RDW: 16.5 % — ABNORMAL HIGH (ref 11.5–15.5)
WBC: 2.9 10*3/uL — ABNORMAL LOW (ref 4.0–10.5)
nRBC: 0 % (ref 0.0–0.2)

## 2019-04-11 LAB — CSF CULTURE W GRAM STAIN: Culture: NO GROWTH

## 2019-04-11 LAB — COMPREHENSIVE METABOLIC PANEL
ALT: 24 U/L (ref 0–44)
AST: 34 U/L (ref 15–41)
Albumin: 3.2 g/dL — ABNORMAL LOW (ref 3.5–5.0)
Alkaline Phosphatase: 82 U/L (ref 38–126)
Anion gap: 8 (ref 5–15)
BUN: 10 mg/dL (ref 6–20)
CO2: 23 mmol/L (ref 22–32)
Calcium: 8.4 mg/dL — ABNORMAL LOW (ref 8.9–10.3)
Chloride: 109 mmol/L (ref 98–111)
Creatinine, Ser: 1.07 mg/dL (ref 0.61–1.24)
GFR calc Af Amer: >60 mL/min (ref >60)
GFR calc non Af Amer: >60 mL/min (ref >60)
Glucose, Bld: 128 mg/dL — ABNORMAL HIGH (ref 70–99)
Potassium: 3.6 mmol/L (ref 3.5–5.1)
Sodium: 140 mmol/L (ref 135–145)
Total Bilirubin: 1.2 mg/dL (ref 0.3–1.2)
Total Protein: 5.7 g/dL — ABNORMAL LOW (ref 6.5–8.1)

## 2019-04-11 LAB — MAGNESIUM: Magnesium: 1.8 mg/dL (ref 1.7–2.4)

## 2019-04-11 MED ORDER — IBUPROFEN 200 MG PO TABS
400.0000 mg | ORAL_TABLET | Freq: Four times a day (QID) | ORAL | Status: DC | PRN
  Administered 2019-04-11: 400 mg via ORAL
  Filled 2019-04-11: qty 2

## 2019-04-11 NOTE — TOC Progression Note (Addendum)
Transition of Care [facility]) - Progression Note    Patient Details  Name: Carl [name] MRN: [id number] Date of Birth: June 16, 1965  [facility]) CM/SW [place], LCSW Phone Number: 04/11/2019, 4:08 PM  Clinical Narrative:    CSW received voicemail from patient's brother, Carl [name] requesting a call back. Per patient, CSW does not have permission to contact his brother and share information, but that patient will call Carl [name] to see what he wants.   CSW returned [name]'s phone call and explained CSW did not have permission to share information but that CSW could listen to his concerns. Carl [name] reported that patient lives alone in his father's house. His father has moved in with [name] across the street due to requiring hospice. Carl [name] shares that two years ago patient was sent to [facility] for an overdose. Carl [name] is concerned that patient is not able to live alone and does not take his medications correctly. Carl [name] states that the house the patient is living in will go to [name]'s son upon their father's death and that patient has been searching for alternate housing, but he only makes $800/month and $40 in food stamps. He requested CSW provide patient with information on housing resources.    Expected Discharge Plan: OP Rehab Barriers to Discharge: Continued Medical Work up  Expected Discharge Plan and Services Expected Discharge Plan: OP Rehab In-house Referral: Clinical Social Work Discharge Planning Services: CM Consult Post Acute Care Choice: [place] arrangements for the past 2 months: [place] Determinants of Health (SDOH) Interventions    Readmission Risk Interventions Readmission Risk Prevention Plan 04/11/2019  Transportation Screening Complete  PCP or Specialist Appt within 5-7 Days Complete  Home Care Screening Complete  Medication Review (RN CM) Complete  Some recent data  might be hidden

## 2019-04-11 NOTE — Consult Note (Signed)
Inpatient Rehabilitation Admissions Coordinator  Inpatient rehab consult received. I met with patient at bedside for rehab assessment. Pt up with physical al therapy 150 feet min assist yesterday, but using condom catheter to urinate and STEDY to get up to bathroom with nursing. I spoke with Sian, RN to request use of urinal and up with nursing as well as therapy to assist with his wish to d/c directly home with [facility] follow up over the next day or so. Pt does not want an inpt rehab admit at this time.  Danne Baxter, RN, MSN Rehab Admissions Coordinator [phone] 04/11/2019 11:17 AM
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
## 2019-04-12 ENCOUNTER — Encounter (HOSPITAL_COMMUNITY): Payer: Self-pay

## 2019-04-12 ENCOUNTER — Inpatient Hospital Stay (HOSPITAL_COMMUNITY)
Admission: RE | Admit: 2019-04-12 | Discharge: 2019-04-27 | DRG: 442 | Disposition: A | Discharge status: Home Health | Payer: Medicaid Other | Source: Intra-hospital | Attending: Physical Medicine & Rehabilitation | Admitting: Physical Medicine & Rehabilitation

## 2019-04-12 ENCOUNTER — Other Ambulatory Visit: Payer: Self-pay

## 2019-04-12 DIAGNOSIS — K7031 Alcoholic cirrhosis of liver with ascites: Secondary | ICD-10-CM | POA: Diagnosis present

## 2019-04-12 DIAGNOSIS — K219 Gastro-esophageal reflux disease without esophagitis: Secondary | ICD-10-CM | POA: Diagnosis present

## 2019-04-12 DIAGNOSIS — B20 Human immunodeficiency virus [HIV] disease: Secondary | ICD-10-CM | POA: Diagnosis not present

## 2019-04-12 DIAGNOSIS — F17213 Nicotine dependence, cigarettes, with withdrawal: Secondary | ICD-10-CM | POA: Diagnosis present

## 2019-04-12 DIAGNOSIS — R278 Other lack of coordination: Secondary | ICD-10-CM | POA: Diagnosis present

## 2019-04-12 DIAGNOSIS — Z882 Allergy status to sulfonamides status: Secondary | ICD-10-CM

## 2019-04-12 DIAGNOSIS — Z880 Allergy status to penicillin: Secondary | ICD-10-CM | POA: Diagnosis not present

## 2019-04-12 DIAGNOSIS — G9009 Other idiopathic peripheral autonomic neuropathy: Secondary | ICD-10-CM | POA: Diagnosis present

## 2019-04-12 DIAGNOSIS — F1026 Alcohol dependence with alcohol-induced persisting amnestic disorder: Secondary | ICD-10-CM | POA: Diagnosis present

## 2019-04-12 DIAGNOSIS — Z9114 Patient's other noncompliance with medication regimen: Secondary | ICD-10-CM | POA: Diagnosis not present

## 2019-04-12 DIAGNOSIS — R4182 Altered mental status, unspecified: Secondary | ICD-10-CM

## 2019-04-12 DIAGNOSIS — K7290 Hepatic failure, unspecified without coma: Principal | ICD-10-CM | POA: Diagnosis present

## 2019-04-12 DIAGNOSIS — R5381 Other malaise: Secondary | ICD-10-CM | POA: Diagnosis present

## 2019-04-12 DIAGNOSIS — Z6841 Body Mass Index (BMI) 40.0 and over, adult: Secondary | ICD-10-CM

## 2019-04-12 DIAGNOSIS — E785 Hyperlipidemia, unspecified: Secondary | ICD-10-CM | POA: Diagnosis present

## 2019-04-12 DIAGNOSIS — I8510 Secondary esophageal varices without bleeding: Secondary | ICD-10-CM | POA: Diagnosis present

## 2019-04-12 DIAGNOSIS — E7220 Disorder of urea cycle metabolism, unspecified: Secondary | ICD-10-CM

## 2019-04-12 DIAGNOSIS — K7682 Hepatic encephalopathy: Secondary | ICD-10-CM | POA: Diagnosis present

## 2019-04-12 DIAGNOSIS — R7989 Other specified abnormal findings of blood chemistry: Secondary | ICD-10-CM | POA: Diagnosis not present

## 2019-04-12 DIAGNOSIS — Z85828 Personal history of other malignant neoplasm of skin: Secondary | ICD-10-CM

## 2019-04-12 DIAGNOSIS — F5104 Psychophysiologic insomnia: Secondary | ICD-10-CM | POA: Diagnosis present

## 2019-04-12 DIAGNOSIS — D709 Neutropenia, unspecified: Secondary | ICD-10-CM | POA: Diagnosis present

## 2019-04-12 DIAGNOSIS — K7030 Alcoholic cirrhosis of liver without ascites: Secondary | ICD-10-CM | POA: Diagnosis present

## 2019-04-12 DIAGNOSIS — R682 Dry mouth, unspecified: Secondary | ICD-10-CM | POA: Diagnosis not present

## 2019-04-12 DIAGNOSIS — E876 Hypokalemia: Secondary | ICD-10-CM | POA: Diagnosis present

## 2019-04-12 DIAGNOSIS — I878 Other specified disorders of veins: Secondary | ICD-10-CM | POA: Diagnosis present

## 2019-04-12 DIAGNOSIS — K766 Portal hypertension: Secondary | ICD-10-CM | POA: Diagnosis present

## 2019-04-12 DIAGNOSIS — I10 Essential (primary) hypertension: Secondary | ICD-10-CM | POA: Diagnosis present

## 2019-04-12 DIAGNOSIS — Z91410 Personal history of adult physical and sexual abuse: Secondary | ICD-10-CM | POA: Diagnosis not present

## 2019-04-12 LAB — AMMONIA: Ammonia: 105 umol/L — ABNORMAL HIGH (ref 9–35)

## 2019-04-12 MED ORDER — TRAZODONE HCL 50 MG PO TABS: 50.0000 mg | ORAL_TABLET | Freq: Every evening | ORAL | 0 refills | Status: DC | PRN

## 2019-04-12 MED ORDER — NICOTINE POLACRILEX 2 MG MT GUM: 2.0000 mg | CHEWING_GUM | OROMUCOSAL | 0 refills | Status: DC | PRN

## 2019-04-12 MED ORDER — PROCHLORPERAZINE MALEATE 5 MG PO TABS: 10.0000 mg | ORAL_TABLET | Freq: Four times a day (QID) | ORAL | Status: DC | PRN

## 2019-04-12 MED ORDER — FOLIC ACID 1 MG PO TABS
1.0000 mg | ORAL_TABLET | Freq: Every day | ORAL | Status: DC
  Administered 2019-04-13 – 2019-04-27 (×15): 1 mg via ORAL
  Filled 2019-04-12 (×15): qty 1

## 2019-04-12 MED ORDER — POTASSIUM CHLORIDE CRYS ER 20 MEQ PO TBCR: 20.0000 meq | EXTENDED_RELEASE_TABLET | Freq: Every day | ORAL | 0 refills | Status: DC

## 2019-04-12 MED ORDER — ONDANSETRON HCL 4 MG PO TABS: 4.0000 mg | ORAL_TABLET | Freq: Four times a day (QID) | ORAL | Status: DC | PRN

## 2019-04-12 MED ORDER — ONDANSETRON HCL 4 MG/2ML IJ SOLN: 4.0000 mg | Freq: Four times a day (QID) | INTRAMUSCULAR | Status: DC | PRN

## 2019-04-12 MED ORDER — ENOXAPARIN SODIUM 80 MG/0.8ML ~~LOC~~ SOLN
80.0000 mg | SUBCUTANEOUS | Status: DC
  Administered 2019-04-13: 80 mg via SUBCUTANEOUS
  Filled 2019-04-12 (×2): qty 0.8

## 2019-04-12 MED ORDER — HYDROXYZINE HCL 50 MG PO TABS: 50.0000 mg | ORAL_TABLET | Freq: Four times a day (QID) | ORAL | 0 refills | Status: DC | PRN

## 2019-04-12 MED ORDER — MUSCLE RUB 10-15 % EX CREA
1.0000 "application " | TOPICAL_CREAM | Freq: Three times a day (TID) | CUTANEOUS | Status: DC
  Administered 2019-04-13 – 2019-04-24 (×30): 1 via TOPICAL
  Filled 2019-04-12: qty 85

## 2019-04-12 MED ORDER — ALUM & MAG HYDROXIDE-SIMETH 200-200-20 MG/5ML PO SUSP
30.0000 mL | ORAL | Status: DC | PRN
  Administered 2019-04-13 – 2019-04-26 (×7): 30 mL via ORAL
  Filled 2019-04-12 (×7): qty 30

## 2019-04-12 MED ORDER — TAB-A-VITE/IRON PO TABS: 1.0000 | ORAL_TABLET | Freq: Every day | ORAL | 0 refills | Status: DC

## 2019-04-12 MED ORDER — TRAMADOL HCL 50 MG PO TABS
50.0000 mg | ORAL_TABLET | Freq: Four times a day (QID) | ORAL | Status: DC | PRN
  Administered 2019-04-12 – 2019-04-25 (×15): 50 mg via ORAL
  Filled 2019-04-12 (×15): qty 1

## 2019-04-12 MED ORDER — TRAZODONE HCL 50 MG PO TABS
100.0000 mg | ORAL_TABLET | Freq: Every day | ORAL | Status: DC
  Administered 2019-04-12 – 2019-04-26 (×15): 100 mg via ORAL
  Filled 2019-04-12 (×15): qty 2

## 2019-04-12 MED ORDER — LACTULOSE 10 GM/15ML PO SOLN
30.0000 g | Freq: Four times a day (QID) | ORAL | Status: DC
  Administered 2019-04-12: 30 g via ORAL
  Filled 2019-04-12 (×2): qty 60

## 2019-04-12 MED ORDER — VITAMIN B-1 100 MG PO TABS
100.0000 mg | ORAL_TABLET | Freq: Every day | ORAL | Status: DC
  Administered 2019-04-13 – 2019-04-27 (×15): 100 mg via ORAL
  Filled 2019-04-12 (×15): qty 1

## 2019-04-12 MED ORDER — EMTRICITABINE-TENOFOVIR AF 200-25 MG PO TABS
1.0000 | ORAL_TABLET | Freq: Every day | ORAL | Status: DC
  Administered 2019-04-13 – 2019-04-27 (×15): 1 via ORAL
  Filled 2019-04-12 (×16): qty 1

## 2019-04-12 MED ORDER — LACTULOSE 10 GM/15ML PO SOLN: 30.0000 g | Freq: Four times a day (QID) | ORAL | 0 refills | Status: DC

## 2019-04-12 MED ORDER — LACTULOSE 10 GM/15ML PO SOLN
30.0000 g | Freq: Four times a day (QID) | ORAL | Status: DC
  Administered 2019-04-12 – 2019-04-27 (×58): 30 g via ORAL
  Filled 2019-04-12 (×61): qty 45

## 2019-04-12 MED ORDER — FUROSEMIDE 80 MG PO TABS: 80.0000 mg | ORAL_TABLET | Freq: Two times a day (BID) | ORAL | 0 refills | Status: DC

## 2019-04-12 MED ORDER — FLEET ENEMA 7-19 GM/118ML RE ENEM: 1.0000 | ENEMA | Freq: Once | RECTAL | Status: DC | PRN

## 2019-04-12 MED ORDER — AMILORIDE HCL 5 MG PO TABS: 15.0000 mg | ORAL_TABLET | Freq: Every day | ORAL | 0 refills | Status: DC

## 2019-04-12 MED ORDER — FUROSEMIDE 40 MG PO TABS
80.0000 mg | ORAL_TABLET | Freq: Every day | ORAL | Status: DC
  Administered 2019-04-13 – 2019-04-27 (×15): 80 mg via ORAL
  Filled 2019-04-12 (×15): qty 2

## 2019-04-12 MED ORDER — FUROSEMIDE 80 MG PO TABS: 80.0000 mg | ORAL_TABLET | Freq: Every day | ORAL | Status: DC

## 2019-04-12 MED ORDER — METHOCARBAMOL 500 MG PO TABS
500.0000 mg | ORAL_TABLET | Freq: Four times a day (QID) | ORAL | Status: DC | PRN
  Administered 2019-04-13 – 2019-04-22 (×7): 500 mg via ORAL
  Filled 2019-04-12 (×7): qty 1

## 2019-04-12 MED ORDER — POTASSIUM CHLORIDE CRYS ER 20 MEQ PO TBCR
40.0000 meq | EXTENDED_RELEASE_TABLET | Freq: Two times a day (BID) | ORAL | Status: DC
  Administered 2019-04-12 – 2019-04-16 (×8): 40 meq via ORAL
  Filled 2019-04-12 (×8): qty 2

## 2019-04-12 MED ORDER — PROCHLORPERAZINE EDISYLATE 10 MG/2ML IJ SOLN: 10.0000 mg | Freq: Four times a day (QID) | INTRAMUSCULAR | Status: DC | PRN

## 2019-04-12 MED ORDER — FUROSEMIDE 80 MG PO TABS: 80.0000 mg | ORAL_TABLET | Freq: Every day | ORAL | 0 refills | Status: DC

## 2019-04-12 MED ORDER — DORAVIRINE 100 MG PO TABS
100.0000 mg | ORAL_TABLET | Freq: Every day | ORAL | Status: DC
  Administered 2019-04-13 – 2019-04-27 (×15): 100 mg via ORAL
  Filled 2019-04-12 (×17): qty 1

## 2019-04-12 MED ORDER — FOLIC ACID 1 MG PO TABS: 1.0000 mg | ORAL_TABLET | Freq: Every day | ORAL | 0 refills | Status: DC

## 2019-04-12 MED ORDER — HYDROXYZINE HCL 25 MG PO TABS: 50.0000 mg | ORAL_TABLET | Freq: Four times a day (QID) | ORAL | Status: DC | PRN

## 2019-04-12 MED ORDER — NYSTATIN 100000 UNIT/GM EX POWD
Freq: Two times a day (BID) | CUTANEOUS | Status: DC
  Administered 2019-04-12 – 2019-04-26 (×20): via TOPICAL
  Filled 2019-04-12 (×2): qty 15

## 2019-04-12 MED ORDER — HYDROXYZINE HCL 25 MG PO TABS
50.0000 mg | ORAL_TABLET | Freq: Four times a day (QID) | ORAL | Status: DC | PRN
  Administered 2019-04-20 – 2019-04-26 (×3): 50 mg via ORAL
  Filled 2019-04-12 (×3): qty 2

## 2019-04-12 MED ORDER — BISACODYL 10 MG RE SUPP: 10.0000 mg | Freq: Every day | RECTAL | Status: DC | PRN

## 2019-04-12 MED ORDER — GUAIFENESIN-DM 100-10 MG/5ML PO SYRP: 5.0000 mL | ORAL_SOLUTION | Freq: Four times a day (QID) | ORAL | Status: DC | PRN

## 2019-04-12 MED ORDER — TAB-A-VITE/IRON PO TABS
1.0000 | ORAL_TABLET | Freq: Every day | ORAL | Status: DC
  Administered 2019-04-13 – 2019-04-27 (×15): 1 via ORAL
  Filled 2019-04-12 (×16): qty 1

## 2019-04-12 MED ORDER — AMILORIDE HCL 5 MG PO TABS
15.0000 mg | ORAL_TABLET | Freq: Every day | ORAL | Status: DC
  Administered 2019-04-13 – 2019-04-27 (×15): 15 mg via ORAL
  Filled 2019-04-12 (×16): qty 3

## 2019-04-12 MED ORDER — DIPHENHYDRAMINE HCL 12.5 MG/5ML PO ELIX
12.5000 mg | ORAL_SOLUTION | Freq: Four times a day (QID) | ORAL | Status: DC | PRN
  Administered 2019-04-16: 22:00:00 12.5 mg via ORAL
  Administered 2019-04-17: 21:00:00 25 mg via ORAL
  Filled 2019-04-12 (×2): qty 10
  Filled 2019-04-12: qty 0

## 2019-04-12 MED ORDER — TRAZODONE HCL 50 MG PO TABS
25.0000 mg | ORAL_TABLET | Freq: Every evening | ORAL | Status: DC | PRN
  Filled 2019-04-12: qty 1

## 2019-04-12 MED ORDER — POLYETHYLENE GLYCOL 3350 17 G PO PACK: 17.0000 g | PACK | Freq: Every day | ORAL | Status: DC | PRN

## 2019-04-12 MED ORDER — THIAMINE HCL 100 MG PO TABS: 100.0000 mg | ORAL_TABLET | Freq: Every day | ORAL | 0 refills | Status: DC
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
## 2019-04-12 NOTE — TOC Transition Note (Signed)
Transition of Care [facility]) - CM/SW Discharge Note   Patient Details  Name: ZARIAN FENNELL MRN: [id number] Date of Birth: [birth year]  Transition of Care [facility]) CM/SW Contact:  Benard Halsted, LCSW Phone Number: 04/12/2019, 12:41 PM   Clinical Narrative:    Patient discharging to CIR. No other needs identified at this time.    Final next level of care: IP Rehab Facility Barriers to Discharge: No Barriers Identified   Patient Goals and CMS Choice Patient states their goals for this hospitalization and ongoing recovery are:: Get stronger CMS Medicare.gov Compare Post Acute Care list provided to:: Patient Choice offered to / list presented to : Patient  Discharge Placement                       Discharge Plan and Services In-house Referral: Clinical Social Work Discharge Planning Services: CM Consult Post Acute Care Choice: Home Health                               Social Determinants of Health (SDOH) Interventions     Readmission Risk Interventions Readmission Risk Prevention Plan 04/11/2019  Transportation Screening Complete  PCP or Specialist Appt within 5-7 Days Complete  Home Care Screening Complete  Medication Review (RN CM) Complete  Some recent data might be hidden
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
## 2019-04-12 NOTE — Progress Notes (Signed)
Update: During visit with patient this morning, he was receptive to inpatient rehab.
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
## 2019-04-12 NOTE — Progress Notes (Signed)
Pt arrived to unit via bed, pt is alert and oriented x 4 however has intermittent confusion.  Pt oriented to rehab and therapies for tomorrow, all needs met, call bell in reach, bed in low position, SR up x 3  
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
## 2019-04-12 NOTE — Progress Notes (Addendum)
Inpatient Rehabilitation Admissions Coordinator  I met with patient at bedside to discuss goals an expectations of an inpt rehab admit. He is now in agreement to admit for a possible 7 day LOS before returning home to prior living arrangements. I contacted Dr. Nevada Crane and SW, [place]. I will make the arrangements to admit today.Discussed with Dr. Naaman Plummer and he is  in agreement to admission.  Danne Baxter, RN, MSN Rehab Admissions Coordinator [phone] 04/12/2019 11:16 AM
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
## 2019-04-12 NOTE — Discharge Instructions (Signed)
Ammonia  Ammonia Test Why am I having this test? Ammonia testing is used to help diagnose and monitor severe liver diseases. It is also used to diagnose and monitor a brain disorder that can develop in individuals who have liver disease (hepatic encephalopathy). What is being tested? This test measures the levels of ammonia in the blood. Ammonia levels can rise when the liver and kidneys are not working well enough to get rid of urea. Urea is produced by the liver when it breaks down proteins from food. A buildup of ammonia in the body can cause mental and neurological changes that can lead to confusion, disorientation, sleepiness, and eventually coma and even death. Infants and children with increased ammonia levels may become irritable, may vomit often, and may become increasingly lethargic. If ammonia levels stay too high, they may experience seizures and breathing difficulties, and may go into a coma and die. What kind of sample is taken?  A blood sample is needed for this test. It is usually collected by inserting a needle into a blood vessel. How do I prepare for this test? Follow instructions from your health care provider or your child's health care provider about avoiding the following before the test:  Exercise.  Smoking cigarettes.  Certain medicines. Ask your health care provider what medicines to avoid. How are the results reported? The test results will be reported as ranges. The health care provider will compare the results to normal ranges that were established after testing a large group of people (reference ranges). Reference ranges may vary among labs and hospitals. For this test, common reference ranges are:  Adult: 10-80 mcg/dL or 6-47 micromol/L (SI units).  Child: 40-80 mcg/dL.  Newborn: 90-150 mcg/dL. What do the results mean? Increased levels of ammonia may mean that you have or your child has:  Liver disease.  Gastrointestinal (GI) bleeding or GI  obstruction.  Severe heart failure.  Hemolytic disease of the newborn.  Hepatic encephalopathy.  A genetic metabolic disorder.  Reye syndrome. Decreased levels of ammonia may mean that you have or your child has:  High blood pressure (hypertension).  A genetic metabolic syndrome. Talk with the health care provider about what the results mean. Questions to ask your health care provider Ask your health care provider, or the department that is doing the test:  When will my results be ready?  How will I get my results?  What are my treatment options?  What other tests do I need?  What are my next steps? Summary  Ammonia testing is used to help diagnose and monitor severe liver diseases.  This test measures the levels of ammonia in the blood. Ammonia levels can rise when the liver and kidneys are not working well enough to get rid of urea. Urea is produced by the liver when it breaks down proteins from food.  A buildup of ammonia in the body can cause mental and neurological changes that can lead to confusion, disorientation, sleepiness, and eventually coma and even death. This information is not intended to replace advice given to you by your health care provider. Make sure you discuss any questions you have with your health care provider. Document Released: 06/22/2004 Document Revised: 05/13/2017 Document Reviewed: 12/28/2016 Elsevier Patient Education  2020 [employer]. Hepatic Encephalopathy  Hepatic encephalopathy is a loss of brain function due to advanced liver disease. When the liver is damaged, harmful substances (toxins) can build up in the body. Some of these toxins, such as ammonia, can harm the  brain. The effects of the condition depend on the type of liver damage and how severe it is. In some cases, hepatic encephalopathy can be reversed. What are the causes? Certain things can trigger or worsen hepatic encephalopathy, such  as:  Infection.  Constipation.  Taking certain medicines, such as benzodiazepines.  Alcohol use.  Bleeding into the intestinal tract.  Imbalances in minerals (electrolytes) in the body.  Dehydration. Hepatic encephalopathy can sometimes be reversed if these triggers are resolved. What increases the risk? You are at risk of developing this condition if you have advanced liver disease (cirrhosis). Conditions that can cause liver disease include:  Infections in the liver, such as hepatitis C.  Infections in the blood.  Drinking a lot of alcohol over a long period of time.  Taking certain medicines, including tranquilizers, diuretics, antidepressants, sleeping pills, or acetaminophen.  Genetic diseases, such as Wilson's disease. What are the signs or symptoms? Symptoms may develop suddenly. Or, they may develop slowly and get worse gradually. Symptoms can range from mild to severe. Mild symptoms include:  Mild confusion.  Shortened attention span.  Personality and mood changes.  Anxiety and agitation.  Drowsiness. Symptoms of worsening or severe hepatic encephalopathy include:  Extreme confusion (disorientation).  Slowed movement.  Slurred speech.  Extreme personality changes.  Abnormal shaking or flapping of the hands.  Coma. How is this diagnosed? This condition may be diagnosed based on:  A physical exam.  Your symptoms and medical history.  Blood tests. These may be done to check levels of ammonia in your blood, measure how long it takes your blood to clot, or check for infection.  Liver function tests. These may be done to check how well your liver is working.  MRI and CT scans. These may be done to check for a brain disorder and to check for problems with your liver.  Electroencephalogram (EEG). This test measures the electrical activity in your brain. How is this treated? The first step in treatment is to identify and treat the cause of your  liver damage or triggering illness, if possible. The next step is taking medicine to lower the level of toxins in your body and prevent ammonia from building up. Treatment will depend on how severe your encephalopathy is, and may include:  Medicine to lower your ammonia level (lactulose).  Antibiotic medicine to reduce the amount of ammonia-producing bacteria in your gut.  Close monitoring of your blood pressure, heart rate, breathing, and oxygen levels.  Removal of fluid from your abdomen.  Close monitoring of how you think, feel, and act (mental status).  Dietary changes.  Liver transplant, in severe cases. Follow these instructions at home: Eating and drinking   Work with a dietitian or with your health care provider to make sure you are getting the right balance of protein and minerals.  Drink enough fluids to keep your urine pale yellow.  Do not drink alcohol or use drugs. General instructions  If you were prescribed an antibiotic, take it as told by your health care provider. Do not stop taking the antibiotic even if your condition improves.  Take other over-the-counter and prescription medicines only as told by your health care provider.  Do not start taking any new medicines, including over-the-counter medicines, without first checking with your health care provider.  Keep all follow-up visits as told by your health care provider. This is important. Contact a health care provider if:  You develop new symptoms.  Your symptoms change or get worse.  You have a fever.  You are constipated. Signs of constipation include having: ? Fewer bowel movements in a week than normal. ? Trouble having a bowel movement. ? Stools that are dry, hard, or larger than normal.  You have persistent nausea, vomiting, or diarrhea. Get help right away if:  You become very confused or drowsy.  You vomit blood or material that looks like coffee grounds.  Your stool is bloody, black,  or looks like tar. Summary  Hepatic encephalopathy is a loss of brain function due to advanced liver disease. When the liver is damaged, harmful substances (toxins) can build up in your body. Some of these toxins, such as ammonia, can harm your brain.  Certain things can trigger or worsen hepatic encephalopathy. Hepatic encephalopathy can sometimes be reversed if these triggers are resolved.  The first step in treatment is to identify and treat the cause of your liver damage or triggering illness, if possible. The next step is taking medicine to lower the level of toxins in your body and prevent ammonia from building up.  Your treatment will depend on how severe your hepatic encephalopathy is. This information is not intended to replace advice given to you by your health care provider. Make sure you discuss any questions you have with your health care provider. Document Released: 08/10/2006 Document Revised: 05/13/2017 Document Reviewed: 03/01/2017 Elsevier Patient Education  2020 [employer].

## 2019-04-13 ENCOUNTER — Inpatient Hospital Stay (HOSPITAL_COMMUNITY): Payer: Medicaid Other | Admitting: Occupational Therapy

## 2019-04-13 ENCOUNTER — Inpatient Hospital Stay (HOSPITAL_COMMUNITY): Payer: Medicaid Other

## 2019-04-13 DIAGNOSIS — K7031 Alcoholic cirrhosis of liver with ascites: Secondary | ICD-10-CM

## 2019-04-13 DIAGNOSIS — D696 Thrombocytopenia, unspecified: Secondary | ICD-10-CM

## 2019-04-13 DIAGNOSIS — K7290 Hepatic failure, unspecified without coma: Secondary | ICD-10-CM

## 2019-04-13 DIAGNOSIS — R7989 Other specified abnormal findings of blood chemistry: Secondary | ICD-10-CM | POA: Diagnosis not present

## 2019-04-13 DIAGNOSIS — E876 Hypokalemia: Secondary | ICD-10-CM | POA: Diagnosis not present

## 2019-04-13 LAB — COMPREHENSIVE METABOLIC PANEL
ALT: 26 U/L (ref 0–44)
AST: 30 U/L (ref 15–41)
Albumin: 3.3 g/dL — ABNORMAL LOW (ref 3.5–5.0)
Alkaline Phosphatase: 90 U/L (ref 38–126)
Anion gap: 9 (ref 5–15)
BUN: 14 mg/dL (ref 6–20)
CO2: 20 mmol/L — ABNORMAL LOW (ref 22–32)
Calcium: 9 mg/dL (ref 8.9–10.3)
Chloride: 108 mmol/L (ref 98–111)
Creatinine, Ser: 1.07 mg/dL (ref 0.61–1.24)
GFR calc Af Amer: >60 mL/min (ref >60)
GFR calc non Af Amer: >60 mL/min (ref >60)
Glucose, Bld: 117 mg/dL — ABNORMAL HIGH (ref 70–99)
Potassium: 4.4 mmol/L (ref 3.5–5.1)
Sodium: 137 mmol/L (ref 135–145)
Total Bilirubin: 1.1 mg/dL (ref 0.3–1.2)
Total Protein: 6.1 g/dL — ABNORMAL LOW (ref 6.5–8.1)

## 2019-04-13 LAB — ARBOVIRUS IGG, CSF
California Enceph IgG: <1:1 {titer}
Eastern eq encephalitis, IgG: <1:1 {titer}
St Louis encephalitis, IgG: <1:1 {titer}
West Nile IgG CSF: 0.12 IV (ref <1.30)
Western eq encephalitis, IgG: <1:1 {titer}

## 2019-04-13 LAB — CBC WITH DIFFERENTIAL/PLATELET
Abs Immature Granulocytes: 0.01 10*3/uL (ref 0.00–0.07)
Basophils Absolute: 0 10*3/uL (ref 0.0–0.1)
Basophils Relative: 1 %
Eosinophils Absolute: 0.2 10*3/uL (ref 0.0–0.5)
Eosinophils Relative: 5 %
HCT: 41.2 % (ref 39.0–52.0)
Hemoglobin: 13.1 g/dL (ref 13.0–17.0)
Immature Granulocytes: 0 %
Lymphocytes Relative: 25 %
Lymphs Abs: 0.8 10*3/uL (ref 0.7–4.0)
MCH: 27.9 pg (ref 26.0–34.0)
MCHC: 31.8 g/dL (ref 30.0–36.0)
MCV: 87.7 fL (ref 80.0–100.0)
Monocytes Absolute: 0.3 10*3/uL (ref 0.1–1.0)
Monocytes Relative: 10 %
Neutro Abs: 1.9 10*3/uL (ref 1.7–7.7)
Neutrophils Relative %: 59 %
Platelets: 103 10*3/uL — ABNORMAL LOW (ref 150–400)
RBC: 4.7 MIL/uL (ref 4.22–5.81)
RDW: 16.8 % — ABNORMAL HIGH (ref 11.5–15.5)
WBC: 3.2 10*3/uL — ABNORMAL LOW (ref 4.0–10.5)
nRBC: 0 % (ref 0.0–0.2)

## 2019-04-13 LAB — AMMONIA: Ammonia: 123 umol/L — ABNORMAL HIGH (ref 9–35)

## 2019-04-13 NOTE — Progress Notes (Signed)
[place] PHYSICAL MEDICINE & REHABILITATION PROGRESS NOTE   Subjective/Complaints: Had a fair night. Was a little restless getting used to new environs. Anxious about brother and social situation too  ROS: Patient denies fever, rash, sore throat, blurred vision, nausea, vomiting, diarrhea, cough, shortness of breath or chest pain, joint or back pain, headache, or mood change.    Objective:   No results found. Recent Labs    04/11/19 0332 04/13/19 0456  WBC 2.9* 3.2*  HGB 12.2* 13.1  HCT 39.1 41.2  PLT 96* 103*   Recent Labs    04/11/19 0332 04/13/19 0456  NA 140 137  K 3.6 4.4  CL 109 108  CO2 23 20*  GLUCOSE 128* 117*  BUN 10 14  CREATININE 1.07 1.07  CALCIUM 8.4* 9.0    Intake/Output Summary (Last 24 hours) at 04/13/2019 0941 Last data filed at 04/13/2019 0839 Gross per 24 hour  Intake 462 ml  Output 1725 ml  Net -1263 ml     Physical Exam: Vital Signs Blood pressure 138/79, pulse 67, temperature 97.9 F (36.6 C), resp. rate 18, height 6' (1.829 m), weight (!) 151.4 kg, SpO2 97 %. Constitutional: No distress . Vital signs reviewed. obese HEENT: EOMI, oral membranes moist Neck: no JVD Cardiovascular: RRR without murmur. No JVD    Respiratory: CTA Bilaterally without wheezes or rales. Normal effort    GI: BS +, non-tender, non-distended  Musculoskeletal:     Comments: LB TTP. Tr to 1+ edema b/l LE Left shoulder abduction limited to around 60 deg Neurological: He is alert and oriented to person, place, and time.  Tangential. Thought he had been here for 2 days. Normal language. Followed simple commands. Speech clear. Asterixis. Motor 4/5 UE, 3+ prox to 4/5 distally in LE's. No focal sensory findings.  Skin: Skin is warm and dry. He is not diaphoretic. tattoo left calf Venous stasis changes in LEs above ankle B/L    Psychiatric:  Pleasant and cooperative    Assessment/Plan: 1. Functional deficits secondary to hepatic encephalopathy which require 3+  hours per day of interdisciplinary therapy in a comprehensive inpatient rehab setting.  Physiatrist is providing close team supervision and 24 hour management of active medical problems listed below.  Physiatrist and rehab team continue to assess barriers to discharge/monitor patient progress toward functional and medical goals  Care Tool:  Bathing              Bathing assist       Upper Body Dressing/Undressing Upper body dressing   What is the patient wearing?: Hospital gown only    Upper body assist Assist Level: Set up assist    Lower Body Dressing/Undressing Lower body dressing            Lower body assist       Toileting Toileting    Toileting assist Assist for toileting: Moderate Assistance - Patient 50 - 74%     Transfers Chair/bed transfer  Transfers assist           Locomotion Ambulation   Ambulation assist              Walk 10 feet activity   Assist           Walk 50 feet activity   Assist           Walk 150 feet activity   Assist           Walk 10 feet on uneven surface  activity  Assist           Wheelchair     Assist               Wheelchair 50 feet with 2 turns activity    Assist            Wheelchair 150 feet activity     Assist          Blood pressure 138/79, pulse 67, temperature 97.9 F (36.6 C), resp. rate 18, height 6' (1.829 m), weight (!) 151.4 kg, SpO2 97 %. Medical Problem List and Plan: 1.  Functional deficits/balance deficits  secondary to hepatic encephalopathy  -beginning therapies today  -ELOS 5-7 days, pending medical stability 2.  Antithrombotics: -DVT/anticoagulation:  Pharmaceutical: Continue Lovenox 80 mg   -platelets up to 103k today  -recheck Monday             -antiplatelet therapy: N/A 3. Pain Management:   ice, heat, topical modalities as needed 4. Mood: LCSW to follow for evaluation and support.              -antipsychotic agents:  N/A 5. Neuropsych: This patient appears to be intermittently capable of making decisions on his own behalf. 6. Skin/Wound Care: Routine pressure relief measures.  7. Fluids/Electrolytes/Nutrition: Monitor I/O. Offer protein supplement for boderline albumin. 8. HIV with progressive neutropenia: On Descovy and Pifeltro.  9. Cirrhosis of liver: ammonia up to 123 today 10/30  -lactulose increased yesterday to 30mg  qid  -follow ammonia evels serially  -platelets ok for now  -LFT's normal 10. Ascites: Monitor daily weights. On Midamor and Lasix resumed 10/27   Filed Weights   04/12/19 1712 04/13/19 0500  Weight: (!) 150.7 kg (!) 151.4 kg    -lasix 80mg  daily, may need to increase further   -if further increase in weight, consider increasing dose   -potassium supplement (level 4.4 10/30) 11. Hypokalemia: has history of medication non compliance per records and per K+ was 2.6 on admission- has been repleted-continue to monitor 12. Acute on chronic insomnia: He has used elavil 100 mg in the past--+/- effective.   -continue scheduled trazodone (100mg ) and titrate up as needed.   -sleep hygiene has been discussed 13. Medical Noncompliance- has hx of not taking medications correctly, likely due to worsening encephalopathy- will monitor 14. Morbid Obesity- BMI 46- weight 155 kg-   bariatric bed and use bariatric equipment for pt-  15. 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 Venous stasis- changes on legs- will monitor for insufficiency ulcers.      LOS: 1 days A FACE TO [place] 04/13/2019, 9:41 AM

## 2019-04-13 NOTE — Plan of Care (Signed)
  Problem: RH BOWEL ELIMINATION Goal: RH STG MANAGE BOWEL WITH ASSISTANCE Description: STG Manage Bowel with Moderate Assistance. Outcome: Progressing   Problem: RH SKIN INTEGRITY Goal: RH STG SKIN FREE OF INFECTION/BREAKDOWN Outcome: Progressing Goal: RH STG MAINTAIN SKIN INTEGRITY WITH ASSISTANCE Description: STG Maintain Skin Integrity With minimal Assistance. Outcome: Progressing   Problem: RH SAFETY Goal: RH STG ADHERE TO SAFETY PRECAUTIONS W/ASSISTANCE/DEVICE Description: STG Adhere to Safety Precautions With minimal Assistance/Device. Outcome: Progressing   Problem: RH PAIN MANAGEMENT Goal: RH STG PAIN MANAGED AT OR BELOW PT'S PAIN GOAL Description: Pain < 3/10 with pain medication  Outcome: Progressing

## 2019-04-13 NOTE — Progress Notes (Signed)
Patient information reviewed and entered into [facility] by Becky Keylen Eckenrode, PPS coordinator. Information including medical coding, function ability, and quality indicators will be reviewed and updated through discharge.   

## 2019-04-13 NOTE — IPOC Note (Addendum)
Overall Plan of Care [facility]) Patient Details Name: RONOLD GLOMB MRN: [id number] DOB: 1965-10-12  Admitting Diagnosis: Encephalopathy, hepatic [facility])  Hospital Problems: Principal Problem:   Encephalopathy, hepatic ([place]) Active Problems:   Secondary esophageal varices without bleeding ([place])     Functional Problem List: Nursing Safety, Sensory, Medication Management  PT Balance, Safety, Sensory, Endurance, Motor, Pain  OT Balance, Cognition, Endurance, Pain, Safety  SLP Cognition  TR         Basic ADL's: OT Grooming, Bathing, Dressing, Toileting, Eating     Advanced  ADL's: OT Simple Meal Preparation, Laundry, Light Housekeeping     Transfers: PT Bed Mobility, Bed to Chair, Car, [employer], Floor  OT Toilet, Metallurgist: PT Ambulation, Emergency planning/management officer, Stairs     Additional Impairments: OT Fuctional Use of Upper Extremity  SLP Social Cognition   Attention, Memory, Problem Solving, Awareness  TR      Anticipated Outcomes Item Anticipated Outcome  Self Feeding independent  Swallowing      Basic self-care  independent  Toileting  mod I   Bathroom Transfers supervision  Bowel/Bladder  LBM 08/12/18  Transfers  supervision  Locomotion  supervision  Communication     Cognition  Min-Supervision A  Pain  less than 3 out of 10  Safety/Judgment  to remain fall free while in rehab   Therapy Plan: PT Intensity: Minimum of 1-2 x/day ,45 to 90 minutes PT Frequency: 5 out of 7 days PT Duration Estimated Length of Stay: 7 -9 days OT Intensity: Minimum of 1-2 x/day, 45 to 90 minutes OT Frequency: 5 out of 7 days OT Duration/Estimated Length of Stay: 1 week SLP Intensity: Minumum of 1-2 x/day, 30 to 90 minutes SLP Frequency: 3 to 5 out of 7 days SLP Duration/Estimated Length of Stay: 5-7 days   Due to the current state of emergency, patients may not be receiving their 3-hours of Medicare-mandated therapy.   Team Interventions: Nursing  Interventions Patient/Family Education, Discharge Planning, Medication Management, Bowel Management, Psychosocial Support, Disease Management/Prevention  PT interventions Ambulation/gait training, DME/adaptive equipment instruction, Neuromuscular re-education, Stair training, UE/LE Strength taining/ROM, Wheelchair propulsion/positioning, Training and development officer, Discharge planning, Therapeutic Activities, UE/LE Coordination activities, Disease management/prevention, Functional mobility training, Patient/family education, Therapeutic Exercise  OT Interventions Balance/vestibular training, Self Care/advanced ADL retraining, Therapeutic Exercise, Cognitive remediation/compensation, DME/adaptive equipment instruction, Pain management, UE/LE Strength taining/ROM, Community reintegration, Patient/family education, UE/LE Coordination activities, Discharge planning, Functional mobility training, Psychosocial support, Therapeutic Activities  SLP Interventions Cognitive remediation/compensation, Cueing hierarchy, Functional tasks, Internal/external aids, Patient/family education, Medication managment  TR Interventions    SW/CM Interventions Discharge Planning, Psychosocial Support, Patient/Family Education   Barriers to Discharge MD  Medical stability  Nursing Medication compliance    PT Decreased caregiver support, Home environment access/layout, Lack of/limited family support, Weight, Behavior poor insight, poor safety awareness, impulsivity w/mobility  OT Decreased caregiver support, Lack of/limited family support patient with cognitive/safety impairment may require supervision and support post discharge - need to identify who may be able to provide this assistance  SLP Decreased caregiver support, Lack of/limited family support    SW       Team Discharge Planning: Destination: PT-Home ,OT- Home , SLP-Home Projected Follow-up: PT-Home health PT, OT-  Home health OT, SLP-Home Health SLP, 24 hour  supervision/assistance Projected Equipment Needs: PT-To be determined, Rolling walker with 5" wheels, OT- To be determined, SLP-None recommended by SLP Equipment Details: PT- , OT-  Patient/family involved in discharge planning: PT- Patient,  OT-Patient, SLP-Patient  MD ELOS: 6-7 days Medical Rehab Prognosis:  Excellent Assessment: The patient has been admitted for CIR therapies with the diagnosis of hepatic encephalopathy. The team will be addressing functional mobility, strength, stamina, balance, safety, adaptive techniques and equipment, self-care, bowel and bladder mgt, patient and caregiver education, NMR, cognition, community reentry. Goals have been set at supervision for self-care and mobility and min assist to supervision with cognition.   Due to the current state of emergency, patients may not be receiving their 3 hours per day of Medicare-mandated therapy.    Meredith Staggers, MD, FAAPMR      See Team Conference Notes for weekly updates to the plan of care
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
## 2019-04-14 ENCOUNTER — Inpatient Hospital Stay (HOSPITAL_COMMUNITY): Payer: Medicaid Other | Admitting: Occupational Therapy

## 2019-04-14 ENCOUNTER — Inpatient Hospital Stay (HOSPITAL_COMMUNITY): Payer: Medicaid Other | Admitting: Speech Pathology

## 2019-04-14 ENCOUNTER — Inpatient Hospital Stay (HOSPITAL_COMMUNITY): Payer: Medicaid Other

## 2019-04-14 DIAGNOSIS — K7290 Hepatic failure, unspecified without coma: Secondary | ICD-10-CM | POA: Diagnosis not present

## 2019-04-14 LAB — AMMONIA: Ammonia: 89 umol/L — ABNORMAL HIGH (ref 9–35)

## 2019-04-14 MED ORDER — NICOTINE 7 MG/24HR TD PT24
7.0000 mg | MEDICATED_PATCH | Freq: Every day | TRANSDERMAL | Status: DC
  Administered 2019-04-14 – 2019-04-26 (×11): 7 mg via TRANSDERMAL
  Filled 2019-04-14 (×13): qty 1

## 2019-04-14 MED ORDER — PANTOPRAZOLE SODIUM 40 MG PO TBEC
40.0000 mg | DELAYED_RELEASE_TABLET | Freq: Every day | ORAL | Status: DC
  Administered 2019-04-14 – 2019-04-27 (×14): 40 mg via ORAL
  Filled 2019-04-14 (×14): qty 1

## 2019-04-14 MED ORDER — ENOXAPARIN SODIUM 80 MG/0.8ML ~~LOC~~ SOLN
80.0000 mg | SUBCUTANEOUS | Status: DC
  Administered 2019-04-14: 80 mg via SUBCUTANEOUS
  Filled 2019-04-14 (×2): qty 0.8

## 2019-04-14 NOTE — Progress Notes (Signed)
[facility] PHYSICAL MEDICINE & REHABILITATION PROGRESS NOTE   Subjective/Complaints:   Pt c/o heartburn "pretty bad"- used Nexium at home- also per nurse, pt feels like going into nicotine withdrawal - for some reason, even though been in a hospital a few days and hasn't smoked- will order patch.  ROS: Patient denies fever, rash, sore throat, blurred vision, nausea, vomiting, diarrhea, cough, shortness of breath or chest pain, joint or back pain, headache, or mood change.    Objective:   No results found. Recent Labs    04/13/19 0456  WBC 3.2*  HGB 13.1  HCT 41.2  PLT 103*   Recent Labs    04/13/19 0456  NA 137  K 4.4  CL 108  CO2 20*  GLUCOSE 117*  BUN 14  CREATININE 1.07  CALCIUM 9.0    Intake/Output Summary (Last 24 hours) at 04/14/2019 1257 Last data filed at 04/14/2019 0849 Gross per 24 hour  Intake 680 ml  Output -  Net 680 ml     Physical Exam: Vital Signs Blood pressure 120/62, pulse 66, temperature 97.9 F (36.6 C), temperature source Oral, resp. rate 18, height 6' (1.829 m), weight (!) 150.9 kg, SpO2 97 %. Constitutional: No distress . Vital signs reviewed. Obese Lying in bed; just woke up; cleaned plate for breakfast, NAD HEENT: EOMI, oral membranes moist Neck: no JVD Cardiovascular: RRR without murmur. No JVD    Respiratory: CTA Bilaterally without wheezes or rales. Normal effort    GI: BS +, non-tender, non-distended  Musculoskeletal:     Comments: LB TTP. Tr to 1+ edema b/l LE Left shoulder abduction limited to around 60 deg Neurological: He is alert and oriented to person, place, and time.  Tangential. Thought he had been here for 2 days. Normal language. Followed simple commands. Speech clear. (+) Asterixis. Motor 4/5 UE, 3+ prox to 4/5 distally in LE's. No focal sensory findings.  Skin: Skin is warm and dry. He is not diaphoretic. tattoo left calf Venous stasis changes in LEs above ankle B/L    Psychiatric:  Pleasant and  cooperative    Assessment/Plan: 1. Functional deficits secondary to hepatic encephalopathy which require 3+ hours per day of interdisciplinary therapy in a comprehensive inpatient rehab setting.  Physiatrist is providing close team supervision and 24 hour management of active medical problems listed below.  Physiatrist and rehab team continue to assess barriers to discharge/monitor patient progress toward functional and medical goals  Care Tool:  Bathing    Body parts bathed by patient: Right arm, Left arm, Chest, Abdomen, Front perineal area, Right upper leg, Left upper leg, Face   Body parts bathed by helper: Buttocks, Right lower leg, Left lower leg     Bathing assist Assist Level: Moderate Assistance - Patient 50 - 74%     Upper Body Dressing/Undressing Upper body dressing   What is the patient wearing?: Hospital gown only    Upper body assist Assist Level: Contact Guard/Touching assist Assistive Device Comment: no clothing available  Lower Body Dressing/Undressing Lower body dressing      What is the patient wearing?: Pants     Lower body assist Assist for lower body dressing: Moderate Assistance - Patient 50 - 74%     Toileting Toileting    Toileting assist Assist for toileting: Minimal Assistance - Patient > 75%     Transfers Chair/bed transfer  Transfers assist     Chair/bed transfer assist level: Contact Guard/Touching assist(and verbal cues for safety)  Locomotion Ambulation   Ambulation assist      Assist level: Minimal Assistance - Patient > 75% Assistive device: Walker-rolling Max distance: 250   Walk 10 feet activity   Assist     Assist level: Contact Guard/Touching assist Assistive device: Walker-rolling   Walk 50 feet activity   Assist    Assist level: Minimal Assistance - Patient > 75% Assistive device: Walker-rolling    Walk 150 feet activity   Assist    Assist level: Contact Guard/Touching assist(without  turns) Assistive device: Walker-rolling    Walk 10 feet on uneven surface  activity   Assist Walk 10 feet on uneven surfaces activity did not occur: Safety/medical concerns         Wheelchair     Assist Will patient use wheelchair at discharge?: No             Wheelchair 50 feet with 2 turns activity    Assist            Wheelchair 150 feet activity     Assist          Blood pressure 120/62, pulse 66, temperature 97.9 F (36.6 C), temperature source Oral, resp. rate 18, height 6' (1.829 m), weight (!) 150.9 kg, SpO2 97 %. Medical Problem List and Plan: 1.  Functional deficits/balance deficits  secondary to hepatic encephalopathy  -beginning therapies today  -ELOS 5-7 days, pending medical stability 2.  Antithrombotics: -DVT/anticoagulation:  Pharmaceutical: Continue Lovenox 80 mg   -platelets up to 103k today  -recheck Monday             -antiplatelet therapy: N/A 3. Pain Management:   ice, heat, topical modalities as needed 4. Mood: LCSW to follow for evaluation and support.              -antipsychotic agents: N/A 5. Neuropsych: This patient appears to be intermittently capable of making decisions on his own behalf. 6. Skin/Wound Care: Routine pressure relief measures.  7. Fluids/Electrolytes/Nutrition: Monitor I/O. Offer protein supplement for boderline albumin. 8. HIV with progressive neutropenia: On Descovy and Pifeltro.  9. Cirrhosis of liver: ammonia up to 123 today 10/30  -lactulose increased yesterday to 30mg  qid  -follow ammonia evels serially  -platelets ok for now  -LFT's normal 10. Ascites: Monitor daily weights. On Midamor and Lasix resumed 10/27   Filed Weights   04/12/19 1712 04/13/19 0500 04/14/19 0428  Weight: (!) 150.7 kg (!) 151.4 kg (!) 150.9 kg    -lasix 80mg  daily, may need to increase further   -if further increase in weight, consider increasing dose   -potassium supplement (level 4.4 10/30)  -10/31- weight down  1/2 kg 11. Hypokalemia: has history of medication non compliance per records and per K+ was 2.6 on admission- has been repleted-continue to monitor 12. Acute on chronic insomnia: He has used elavil 100 mg in the past--+/- effective.   -continue scheduled trazodone (100mg ) and titrate up as needed.   -sleep hygiene has been discussed 13. Medical Noncompliance- has hx of not taking medications correctly, likely due to worsening encephalopathy- will monitor 14. Morbid Obesity- BMI 46- weight 155 kg-   bariatric bed and use bariatric equipment for pt-  15. 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 Venous stasis- changes on legs- will monitor for insufficiency ulcers.  17. GERD- will order Protonix 40 mg daily 18. Nicotine dependence- will order Nicotine patch 7 mg daily  LOS: 2 days A FACE TO FACE EVALUATION WAS PERFORMED  [name] 04/14/2019, 12:57 PM

## 2019-04-14 NOTE — Progress Notes (Signed)
Occupational Therapy Session Note  Patient Details  Name: [name] MRN: [id number] Date of Birth: 02-13-1966  Today's Date: 04/14/2019 OT Individual Time: 0900-1000  And 7:30-8 OT Individual Time Calculation (min): 60 min and 30=90   Short Term Goals: Week 1:  OT Short Term Goal 1 (Week 1): STG = LTG  Skilled Therapeutic Interventions/Progress Updates:   1st session 30 minutes:Focus and participation as follows: Bed to edge of bed transfer=extra time due to fatigue and close S as he thrust him up with momentum Toilet transfer via RW to/fr bed/wc=close S Toileting sit to stand=close supervision Standing balance at toilet to wash periarea and buttocks after toieting= min A balance as patient was unable to wash and maintain balance.   This clincian attempted to educate him on wetting the middle and towel to "see saw back and forth" to wash periarea and buttocks, but he did not understand sequencing or returning demonstration and therefore required moderate handover hand assist to complete the washing and drying task for bilateral hand use  donned scrubs pants with extra time for processing task and close S for balance during sit to stand to pull up pants at walker    Sitting balance on toilet and edge of bed =distant S Patient often asked clinician to repeat statements and 4 x during the session, he misinterpreted instruction or task as action or task totally unrelated and required clarification.  Not sure of clinician wearing mask may have had some influence of decreased comprehension    at tne session patient lying bed with call bel and alarm engaged  2nd session 60 minutes  Focus and participation as follows:  supine to edge of bed transfer= close S as patient used momentum to thrust himselp up to side of bed Toilet transfer via walker=close S Toileting= S for voiding only this session Tub bench transfer=close S and min cues for technique as he had difficulty following  demonstrative cues standintg balance tasks= cues and extra time to allow him to problem solve for using one and bilateral hand tasks while stanindg  At end of session patient assisted back to bed with close S and alarms engaged  Continue OT Plan of Care     Therapy Documentation Precautions:  Precautions Precautions: Fall Precaution Comments: has significant mm tremors with mobility, improved with walking Restrictions Weight Bearing Restrictions: No Other Position/Activity Restrictions: chair with gait activity for safety  Pain:denied     Therapy/Group: Individual Therapy  Alfredia Ferguson [facility] 04/14/2019, 3:07 PM

## 2019-04-14 NOTE — Progress Notes (Signed)
Speech Language Pathology Daily Session Note  Patient Details  Name: [name] MRN: [id number] Date of Birth: 12-25-1965  Today's Date: 04/14/2019 SLP Individual Time: [id number] SLP Individual Time Calculation (min): 45 min  Short Term Goals: Week 1: SLP Short Term Goal 1 (Week 1): STG=LTG due to short ELOS  Skilled Therapeutic Interventions: Patient received skilled SLP services targeting cognitive goals. Patient required mod verbal cues to identify 3 external compensatory memory strategies and how he can implement these strategies at home (note taking, leaving important items in the same place every date, using a calendar). Patient participated in a selective attention task for 10 minutes requiring mod verbal cues to attend to task. During a functional sequencing task for home (items needed to prepare a meal, steps to preparing the meal) patient required min verbal cues. Patient recalled 3 tasks he completed with PT/OT this date independently. At the end of therapy session patient was upright in chair, chair alarm activated, and all needs within reach.  Pain Pain Assessment Pain Scale: 0-10 Pain Score: 0-No pain  Therapy/Group: Individual Therapy  Cristy Folks 04/14/2019, 2:02 PM

## 2019-04-14 NOTE — Progress Notes (Signed)
Physical Therapy Session Note  Patient Details  Name: SCOUT VIENS MRN: [id number] Date of Birth: January 25, 1966  Today's Date: 04/14/2019 PT Individual Time: 1109-1205 PT Individual Time Calculation (min): 56 min   Short Term Goals: Week 1:  PT Short Term Goal 1 (Week 1): =LTGS  Skilled Therapeutic Interventions/Progress Updates:    PAIN  Low back 2/5  Treatment to tolerance. Pt significantly less impulsive, less distracted, and much less issues w/tremors today.    Pt initially supine. Supine to sit w/verbal cues and supervision. STS w/cga, SPT w/min assist and direct cueing for safety. Pt instructed w/wc propulsion/turing/negotiaion of tight spaces. Pt transferred to gym for time efficiency.  Gait 250 w/RW and cga except turns require min assist and cues for safety.  In parallel bars - worked on step ups to improve ability to negotiate stairs and steps.  performed w/bars for balance 10reps repeated x 20 second effort sidestpping length of bars x 6 for balance and LE coordination w/cga, cues to attend to coordination. Standing marching w/single UE support x 20 performed for balance and hip strengthening Heel raises w/single UE support x 20reps, unable without UE support, mild post tendency.  Pt instructed w/wc parts management, continues to require assist w/legrests, reminders to lock brakes, differentiatie between locked and unlocked.   Gait 40ft wc to recliner.  W/cues for safety/sequencing.  Pt attempted to remove seat alarm and reminded pt of its purpose and necessity.  Pt agreed to use.  Pt left oob in recliner w/needs in reach and chair alarm set.  Therapy Documentation Precautions:  Precautions Precautions: Fall Precaution Comments: has significant mm tremors with mobility, improved with walking Restrictions Weight Bearing Restrictions: No Other Position/Activity Restrictions: chair with gait activity for safety    Therapy/Group: Individual Therapy  Callie Fielding, [place] 04/14/2019, 12:25 PM

## 2019-04-15 ENCOUNTER — Inpatient Hospital Stay (HOSPITAL_COMMUNITY): Payer: Medicaid Other | Admitting: Speech Pathology

## 2019-04-15 MED ORDER — ENOXAPARIN SODIUM 80 MG/0.8ML ~~LOC~~ SOLN
0.5000 mg/kg | SUBCUTANEOUS | Status: DC
  Administered 2019-04-15 – 2019-04-27 (×13): 75 mg via SUBCUTANEOUS
  Filled 2019-04-15 (×14): qty 0.75

## 2019-04-15 NOTE — Progress Notes (Signed)
Speech Language Pathology Daily Session Note  Patient Details  Name: [name] MRN: [id number] Date of Birth: 1966/05/18  Today's Date: 04/15/2019 SLP Individual Time: [id number] SLP Individual Time Calculation (min): 59 min  Short Term Goals: Week 1: SLP Short Term Goal 1 (Week 1): STG=LTG due to short ELOS  Skilled Therapeutic Interventions:  Pt was seen for skilled ST targeting cognitive goals.  Pt was received in recliner upon therapist's arrival and was noted to be verbose and tangential during conversations with therapist.  At times pt's responses were appropriate to therapist's questions; however, most often his responses were not pertinent to the topic at hand which SLP suspects to be related to the tangential nature of his verbal output.  SLP facilitated the session with a medication management task to address goals for recall and problem solving as pt will likely be discharging home alone.  Despite apparent cognitive deficits, pt was able to recall the function and frequency of his medications when named in >75% of opportunities.  It should be noted that most medications were known to him from prior to admission.  I would suggest practicing organizing a pill box during next scheduled ST appointment.  As therapist was departing, pt asked "Is there a gym I can go to?" Therapist informed pt that he must be accompanied by staff or an approved visitor with a grounds pass if he wishes to leave the unit.  Pt verbalized understanding but then asked "So, can I go for a walk?"  SLP reviewed and reinforced safety precautions and demonstrated use of call bell to communicate all needs to nursing.  SLP also discussed that pt should have someone in the room with him before he tries to get up.  Pt verbalized understanding and was left in recliner with chair alarm set and call bell within reach.  Continue per current plan of care.    Pain Pain Assessment Pain Scale: 0-10 Pain Score: 0-No  pain  Therapy/Group: Individual Therapy  Melodye Swor, [facility] 04/15/2019, 12:16 PM

## 2019-04-15 NOTE — Progress Notes (Signed)
**Note Carl-Identified via Obfuscation** [place] PHYSICAL MEDICINE & REHABILITATION PROGRESS NOTE   Subjective/Complaints:   Pt reports doing well- working on strengthening with therapy- likes tramadol for pain.  .  ROS: Patient denies fever, rash, sore throat, blurred vision, nausea, vomiting, diarrhea, cough, shortness of breath or chest pain, joint or back pain, headache, or mood change.    Objective:   No results found. Recent Labs    04/13/19 0456  WBC 3.2*  HGB 13.1  HCT 41.2  PLT 103*   Recent Labs    04/13/19 0456  NA 137  K 4.4  CL 108  CO2 20*  GLUCOSE 117*  BUN 14  CREATININE 1.07  CALCIUM 9.0    Intake/Output Summary (Last 24 hours) at 04/15/2019 1323 Last data filed at 04/15/2019 0900 Gross per 24 hour  Intake 960 ml  Output -  Net 960 ml     Physical Exam: Vital Signs Blood pressure 118/63, pulse 67, temperature 98.1 F (36.7 C), resp. rate 18, height 6' (1.829 m), weight (!) 150.3 kg, SpO2 94 %. Constitutional: No distress . Vital signs reviewed. Obese Lying in bed; just woke up; cleaned plate for breakfast again; didn't recognize physician today NAD HEENT: EOMI, oral membranes moist Neck: no JVD Cardiovascular: RRR without murmur. No JVD    Respiratory: CTA Bilaterally without wheezes or rales. Normal effort    GI: BS +, non-tender, non-distended  Musculoskeletal:     Comments: LB TTP. Tr to 1+ edema b/l LE Left shoulder abduction limited to around 60 deg Neurological: He is alert and oriented to person, place, and time.  Tangential. Thought he had been here for 2 days. Normal language. Followed simple commands. Speech clear. (+) Asterixis. Motor 4/5 UE, 3+ prox to 4/5 distally in LE's. No focal sensory findings.  Skin: Skin is warm and dry. He is not diaphoretic. tattoo left calf Venous stasis changes in LEs above ankle B/L    Psychiatric:  Pleasant and cooperative; poor memory    Assessment/Plan: 1. Functional deficits secondary to hepatic encephalopathy which require  3+ hours per day of interdisciplinary therapy in a comprehensive inpatient rehab setting.  Physiatrist is providing close team supervision and 24 hour management of active medical problems listed below.  Physiatrist and rehab team continue to assess barriers to discharge/monitor patient progress toward functional and medical goals  Care Tool:  Bathing    Body parts bathed by patient: Right arm, Left arm, Chest, Abdomen, Buttocks, Front perineal area, Right upper leg, Left upper leg, Face   Body parts bathed by helper: Right lower leg, Left lower leg     Bathing assist Assist Level: Minimal Assistance - Patient > 75%     Upper Body Dressing/Undressing Upper body dressing   What is the patient wearing?: Pull over shirt    Upper body assist Assist Level: Supervision/Verbal cueing Assistive Device Comment: no clothing available  Lower Body Dressing/Undressing Lower body dressing      What is the patient wearing?: Pants     Lower body assist Assist for lower body dressing: Moderate Assistance - Patient 50 - 74%     Toileting Toileting    Toileting assist Assist for toileting: Supervision/Verbal cueing     Transfers Chair/bed transfer  Transfers assist     Chair/bed transfer assist level: Contact Guard/Touching assist     Locomotion Ambulation   Ambulation assist      Assist level: Minimal Assistance - Patient > 75% Assistive device: Walker-rolling Max distance: 250   Walk 10  feet activity   Assist     Assist level: Contact Guard/Touching assist Assistive device: Walker-rolling   Walk 50 feet activity   Assist    Assist level: Minimal Assistance - Patient > 75% Assistive device: Walker-rolling    Walk 150 feet activity   Assist    Assist level: Contact Guard/Touching assist(without turns) Assistive device: Walker-rolling    Walk 10 feet on uneven surface  activity   Assist Walk 10 feet on uneven surfaces activity did not occur:  Safety/medical concerns         Wheelchair     Assist Will patient use wheelchair at discharge?: No             Wheelchair 50 feet with 2 turns activity    Assist            Wheelchair 150 feet activity     Assist          Blood pressure 118/63, pulse 67, temperature 98.1 F (36.7 C), resp. rate 18, height 6' (1.829 m), weight (!) 150.3 kg, SpO2 94 %. Medical Problem List and Plan: 1.  Functional deficits/balance deficits  secondary to hepatic encephalopathy  -beginning therapies today  -ELOS 5-7 days, pending medical stability 2.  Antithrombotics: -DVT/anticoagulation:  Pharmaceutical: Continue Lovenox 80 mg   -platelets up to 103k today  -recheck Monday             -antiplatelet therapy: N/A 3. Pain Management:   ice, heat, topical modalities as needed 4. Mood: LCSW to follow for evaluation and support.              -antipsychotic agents: N/A 5. Neuropsych: This patient appears to be intermittently capable of making decisions on his own behalf. 6. Skin/Wound Care: Routine pressure relief measures.  7. Fluids/Electrolytes/Nutrition: Monitor I/O. Offer protein supplement for boderline albumin. 8. HIV with progressive neutropenia: On Descovy and Pifeltro.  9. Cirrhosis of liver: ammonia up to 123 today 10/30  -lactulose increased yesterday to 30mg  qid  -follow ammonia evels serially  -platelets ok for now  -LFT's normal 10. Ascites: Monitor daily weights. On Midamor and Lasix resumed 10/27   Filed Weights   04/13/19 0500 04/14/19 0428 04/15/19 0523  Weight: (!) 151.4 kg (!) 150.9 kg (!) 150.3 kg    -lasix 80mg  daily, may need to increase further   -if further increase in weight, consider increasing dose   -potassium supplement (level 4.4 10/30)  -11/1- weight down 1/2 kg again 11. Hypokalemia: has history of medication non compliance per records and per K+ was 2.6 on admission- has been repleted-continue to monitor 12. Acute on chronic  insomnia: He has used elavil 100 mg in the past--+/- effective.   -continue scheduled trazodone (100mg ) and titrate up as needed.   -sleep hygiene has been discussed 13. Medical Noncompliance- has hx of not taking medications correctly, likely due to worsening encephalopathy- will monitor 14. Morbid Obesity- BMI 46- weight 155 kg-   bariatric bed and use bariatric equipment for pt-  15. 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 Venous stasis- changes on legs- will monitor for insufficiency ulcers.  17. GERD- will order Protonix 40 mg daily 18. Nicotine dependence- will order Nicotine patch 7 mg daily    LOS: 3 days A FACE TO FACE EVALUATION WAS PERFORMED  [name] 04/15/2019, 1:23 PM

## 2019-04-16 ENCOUNTER — Encounter (HOSPITAL_COMMUNITY): Payer: Medicaid Other | Admitting: Psychology

## 2019-04-16 ENCOUNTER — Other Ambulatory Visit: Payer: Self-pay | Admitting: Infectious Disease

## 2019-04-16 ENCOUNTER — Inpatient Hospital Stay (HOSPITAL_COMMUNITY): Payer: Medicaid Other | Admitting: Speech Pathology

## 2019-04-16 ENCOUNTER — Inpatient Hospital Stay (HOSPITAL_COMMUNITY): Payer: Medicaid Other | Admitting: Physical Therapy

## 2019-04-16 ENCOUNTER — Inpatient Hospital Stay (HOSPITAL_COMMUNITY): Payer: Medicaid Other | Admitting: Occupational Therapy

## 2019-04-16 LAB — CBC
HCT: 44.1 % (ref 39.0–52.0)
Hemoglobin: 13.9 g/dL (ref 13.0–17.0)
MCH: 27.6 pg (ref 26.0–34.0)
MCHC: 31.5 g/dL (ref 30.0–36.0)
MCV: 87.5 fL (ref 80.0–100.0)
Platelets: 101 10*3/uL — ABNORMAL LOW (ref 150–400)
RBC: 5.04 MIL/uL (ref 4.22–5.81)
RDW: 16.8 % — ABNORMAL HIGH (ref 11.5–15.5)
WBC: 3.2 10*3/uL — ABNORMAL LOW (ref 4.0–10.5)
nRBC: 0 % (ref 0.0–0.2)

## 2019-04-16 LAB — BASIC METABOLIC PANEL
Anion gap: 8 (ref 5–15)
BUN: 12 mg/dL (ref 6–20)
CO2: 25 mmol/L (ref 22–32)
Calcium: 9.3 mg/dL (ref 8.9–10.3)
Chloride: 108 mmol/L (ref 98–111)
Creatinine, Ser: 1.06 mg/dL (ref 0.61–1.24)
GFR calc Af Amer: >60 mL/min (ref >60)
GFR calc non Af Amer: >60 mL/min (ref >60)
Glucose, Bld: 96 mg/dL (ref 70–99)
Potassium: 5.1 mmol/L (ref 3.5–5.1)
Sodium: 141 mmol/L (ref 135–145)

## 2019-04-16 LAB — AMMONIA: Ammonia: 91 umol/L — ABNORMAL HIGH (ref 9–35)

## 2019-04-16 MED ORDER — POTASSIUM CHLORIDE CRYS ER 20 MEQ PO TBCR
40.0000 meq | EXTENDED_RELEASE_TABLET | Freq: Every day | ORAL | Status: DC
  Administered 2019-04-17 – 2019-04-27 (×11): 40 meq via ORAL
  Filled 2019-04-16 (×11): qty 2

## 2019-04-16 NOTE — Progress Notes (Signed)
Speech Language Pathology Daily Session Note  Patient Details  Name: SUHAYB SLEMMER MRN: [id number] Date of Birth: [birth year]  Today's Date: 04/16/2019 SLP Individual Time: [id number] SLP Individual Time Calculation (min): 55 min  Short Term Goals: Week 1: SLP Short Term Goal 1 (Week 1): STG=LTG due to short ELOS  Skilled Therapeutic Interventions: Skilled treatment session focused on cognitive goals. SLP facilitated session by providing Mod-Max A verbal cues for patient to self-monitor and correct errors during a BID pill organization task. Suspect increased errors were due to decreased selective attention to task due to verbosity in which he required Mod verbal cues for redirection. Patient left upright in bed with alarm on and all needs within reach. Continue with current plan of care.      Pain Pain Assessment Pain Scale: 0-10 Pain Score: 0-No pain  Therapy/Group: Individual Therapy  Ladana Chavero 04/16/2019, 12:54 PM
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
## 2019-04-16 NOTE — Progress Notes (Signed)
Occupational Therapy Session Note  Patient Details  Name: [name] MRN: [id number] Date of Birth: 26-Mar-1966  Today's Date: 04/16/2019 OT Individual Time: 1330-1445 OT Individual Time Calculation (min): 75 min    Short Term Goals: Week 1:  OT Short Term Goal 1 (Week 1): STG = LTG  Skilled Therapeutic Interventions/Progress Updates:    Patient in bed, alert and ready for therapy session.  Bed mobility with CS.  Ambulation in room with RW to/from bed, w/c, shower bench, arm chair CS level, min cues for walker placement.  Shower completed at supervision level.  He completed shaving and grooming CS.  Oral care independent.  UB dressing supervision/set up, LB dressing min A, he is able to donn slipper socks crossing legs with increased time.  Ambulation on unit to/from therapy gym with CS.  Completed perceptual puzzles with min/mod cues and increased time.  Returned to room, seated in bed with bed alarm set at close of session with call bell in reach.    Therapy Documentation Precautions:  Precautions Precautions: Fall Precaution Comments: has significant mm tremors with mobility, improved with walking Restrictions Weight Bearing Restrictions: No Other Position/Activity Restrictions: chair with gait activity for safety General:   Vital Signs: Therapy Vitals Temp: 98 F (36.7 C) Pulse Rate: 73 Resp: 18 BP: 121/76 Patient Position (if appropriate): Lying Oxygen Therapy SpO2: 98 % O2 Device: Room Air Pain: Pain Assessment Pain Scale: 0-10 Pain Score: 0-No pain Other Treatments:     Therapy/Group: Individual Therapy  [name] 04/16/2019, 3:49 PM

## 2019-04-16 NOTE — Care Management (Signed)
[facility] Individual Statement of Services  Patient Name:  [name]  Date:  04/16/2019  Welcome to the [facility].  Our goal is to provide you with an individualized program based on your diagnosis and situation, designed to meet your specific needs.  With this comprehensive rehabilitation program, you will be expected to participate in at least 3 hours of rehabilitation therapies Monday-Friday, with modified therapy programming on the weekends.  Your rehabilitation program will include the following services:  Physical Therapy (PT), Occupational Therapy (OT), Speech Therapy (ST), 24 hour per day rehabilitation nursing, Therapeutic Recreaction (TR), Neuropsychology, Case Management (Social Worker), Rehabilitation Medicine, Nutrition Services and Pharmacy Services  Weekly team conferences will be held on Tuesdays to discuss your progress.  Your Social Worker will talk with you frequently to get your input and to update you on team discussions.  Team conferences with you and your family in attendance may also be held.  Expected length of stay: 7-9 days   Overall anticipated outcome: supervision  Depending on your progress and recovery, your program may change. Your Social Worker will coordinate services and will keep you informed of any changes. Your Social Worker's name and contact numbers are listed  below.  The following services may also be recommended but are not provided by the [facility] will be made to provide these services after discharge if needed.  Arrangements include referral to agencies that provide these services.  Your insurance has been verified to be:  Medicaid Your primary doctor is:  Engineer, site  Pertinent information will be shared with your doctor and your  insurance company.  Social Worker:  [place], [place] or ([phone]   Information discussed with and copy given to patient by: Lennart Pall, 04/16/2019, 1:25 PM

## 2019-04-16 NOTE — Progress Notes (Signed)
[facility] PHYSICAL MEDICINE & REHABILITATION PROGRESS NOTE   Subjective/Complaints:  Says he's feeling a lot better than last week. Head is clearer. Happy with progress. Anxious to get home!  ROS: Patient denies fever, rash, sore throat, blurred vision, nausea, vomiting, diarrhea, cough, shortness of breath or chest pain, joint or back pain, headache, or mood change.    Objective:   No results found. Recent Labs    04/16/19 0520  WBC 3.2*  HGB 13.9  HCT 44.1  PLT 101*   Recent Labs    04/16/19 0520  NA 141  K 5.1  CL 108  CO2 25  GLUCOSE 96  BUN 12  CREATININE 1.06  CALCIUM 9.3    Intake/Output Summary (Last 24 hours) at 04/16/2019 0902 Last data filed at 04/15/2019 2000 Gross per 24 hour  Intake 300 ml  Output -  Net 300 ml     Physical Exam: Vital Signs Blood pressure 135/78, pulse 69, temperature (!) 97.5 F (36.4 C), temperature source Oral, resp. rate 18, height 6' (1.829 m), weight (!) 149 kg, SpO2 96 %. Constitutional: No distress . Vital signs reviewed. obese HEENT: EOMI, oral membranes moist Neck: supple Cardiovascular: RRR without murmur. No JVD    Respiratory: CTA Bilaterally without wheezes or rales. Normal effort    GI: BS +, non-tender, sl distended Musculoskeletal:     Comments: LB TTP. Tr to 1+ edema b/l LE Left shoulder abduction limited to around 60 deg Neurological: alert to person. Recalled me from Friday. Improving insight. Still with memory deficits. Normal language. Followed simple commands. Speech clear. (+) Asterixis. Motor 4/5 UE, 3+ prox to 4/5 distally in LE's. No focal sensory findings.  Skin: Skin is warm and dry. He is not diaphoretic. tattoo left calf Venous stasis changes in LEs above ankle B/L unchanged. A few areas of eschar on dorsum of right foot, toe nail injury/old blood    Psychiatric:  Pleasant and cooperative;verbose    Assessment/Plan: 1. Functional deficits secondary to hepatic encephalopathy which require 3+  hours per day of interdisciplinary therapy in a comprehensive inpatient rehab setting.  Physiatrist is providing close team supervision and 24 hour management of active medical problems listed below.  Physiatrist and rehab team continue to assess barriers to discharge/monitor patient progress toward functional and medical goals  Care Tool:  Bathing    Body parts bathed by patient: Right arm, Left arm, Chest, Abdomen, Buttocks, Front perineal area, Right upper leg, Left upper leg, Face   Body parts bathed by helper: Right lower leg, Left lower leg     Bathing assist Assist Level: Minimal Assistance - Patient > 75%     Upper Body Dressing/Undressing Upper body dressing   What is the patient wearing?: Pull over shirt    Upper body assist Assist Level: Supervision/Verbal cueing Assistive Device Comment: no clothing available  Lower Body Dressing/Undressing Lower body dressing      What is the patient wearing?: Pants     Lower body assist Assist for lower body dressing: Moderate Assistance - Patient 50 - 74%     Toileting Toileting    Toileting assist Assist for toileting: Supervision/Verbal cueing     Transfers Chair/bed transfer  Transfers assist     Chair/bed transfer assist level: Contact Guard/Touching assist     Locomotion Ambulation   Ambulation assist      Assist level: Minimal Assistance - Patient > 75% Assistive device: Walker-rolling Max distance: 250   Walk 10 feet activity  Assist     Assist level: Contact Guard/Touching assist Assistive device: Walker-rolling   Walk 50 feet activity   Assist    Assist level: Minimal Assistance - Patient > 75% Assistive device: Walker-rolling    Walk 150 feet activity   Assist    Assist level: Contact Guard/Touching assist(without turns) Assistive device: Walker-rolling    Walk 10 feet on uneven surface  activity   Assist Walk 10 feet on uneven surfaces activity did not occur:  Safety/medical concerns         Wheelchair     Assist Will patient use wheelchair at discharge?: No             Wheelchair 50 feet with 2 turns activity    Assist            Wheelchair 150 feet activity     Assist          Blood pressure 135/78, pulse 69, temperature (!) 97.5 F (36.4 C), temperature source Oral, resp. rate 18, height 6' (1.829 m), weight (!) 149 kg, SpO2 96 %. Medical Problem List and Plan: 1.  Functional deficits/balance deficits  secondary to hepatic encephalopathy  --Continue CIR therapies including PT, OT, and SLP   -team conference tomorrow 2.  Antithrombotics: -DVT/anticoagulation:  Pharmaceutical: Continue Lovenox 80 mg   -platelets holding 101k 11/2  -antiplatelet therapy: N/A 3. Pain Management:   ice, heat, topical modalities as needed 4. Mood: LCSW to follow for evaluation and support.              -antipsychotic agents: N/A 5. Neuropsych: This patient appears to be intermittently capable of making decisions on his own behalf. 6. Skin/Wound Care: Routine pressure relief measures.  7. Fluids/Electrolytes/Nutrition: Monitor I/O. Offer protein supplement for boderline albumin. 8. HIV with progressive neutropenia: On Descovy and Pifeltro.  9. Cirrhosis of liver: ammonia up to 123 today 10/30  -lactulose increased to 30mg  qid  -ammonia level 89 10/31---recheck tomorrow  -platelets remain stable  -LFT's normal 10. Ascites: Monitor daily weights. On Midamor and Lasix resumed 10/27   Filed Weights   04/14/19 0428 04/15/19 0523 04/16/19 0502  Weight: (!) 150.9 kg (!) 150.3 kg (!) 149 kg    -lasix 80mg  daily  -11/2 weights trending down 11. Hypokalemia: has history of medication non compliance per records and per K+ was 2.6 on admission  -potassium actually high today---reduce supplement to daily 12. Acute on chronic insomnia: He has used elavil 100 mg in the past--+/- effective.   -continue scheduled trazodone (100mg ) and  titrate up as needed.   -sleep hygiene has been discussed 13. Medical Noncompliance- has hx of not taking medications correctly, likely due to worsening encephalopathy.  14. Morbid Obesity- BMI 46- weight 155 kg-   bariatric bed and use bariatric equipment for pt-  15. 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 Venous stasis- changes on legs- will monitor for insufficiency ulcers.  17. GERD-  Protonix 40 mg daily 18. Nicotine dependence- continue Nicotine patch 7 mg daily    LOS: 4 days A FACE TO FACE EVALUATION WAS PERFORMED  Meredith Staggers 04/16/2019, 9:02 AM

## 2019-04-16 NOTE — Progress Notes (Signed)
Physical Therapy Session Note  Patient Details  Name: [name] MRN: [id number] Date of Birth: April 21, 1966  Today's Date: 04/16/2019 PT Individual Time: 1105-1202 PT Individual Time Calculation (min): 57 min   Short Term Goals: Week 1:  PT Short Term Goal 1 (Week 1): =LTGS  Skilled Therapeutic Interventions/Progress Updates:  Pt received in bed & agreeable to tx. Discussed d/c plans & locations with pt with pt demonstrating tangential conversation throughout. Pt ultimately reports he will d/c back to his homeplace where he has a ramp to enter the home from the garage. Pt reports his brother has a truck & a sedan & his friend has a Chief Executive Officer and he is unsure of who will pick him up upon d/c - educated pt that no matter who picks him up this therapist recommends they bring the [place]. Pt completes bed mobility with supervision & hospital bed features & ambulates in room & bathroom initially with CGA then close supervision. Pt completes 3/3 toileting steps & toilet transfer with supervision (continent void), then hand hygiene standing at sink with supervision with cuing to square up to sink. Transported pt to gym via w/c dependent assist for time management. Pt requires cuing throughout session for proper hand placement for sit<>stand transfers as pt attempts to pull up on RW. Pt completes car transfer at sedan simulated height & negotiates ramp with RW with supervision overall. Pt completes Berg Balance Test & scores 32/56; educated pt on interpretation of score & current fall risk, as well as safety recommendations (use RW at all times for mobility, recommendation of supervision at d/c). Patient demonstrates increased fall risk as noted by score of 32/56 on Berg Balance Scale.  (<36= high risk for falls, close to 100%; 37-45 significant >80%; 46-51 moderate >50%; 52-55 lower >25%). Pt returned to room & assisted to bed with supervision. Pt left in bed with alarm set & all needs at hand.  Pt demonstrates  very poor emergent awareness throughout session.    Therapy Documentation Precautions:  Precautions Precautions: Fall Precaution Comments: has significant mm tremors with mobility, improved with walking Restrictions Weight Bearing Restrictions: No Other Position/Activity Restrictions: chair with gait activity for safety  Pain: Pt reports "my back is a little sore but nothing like it was when I moved down here" - unrated pain, rest breaks taken PRN during session.  Balance: Balance Balance Assessed: Yes Standardized Balance Assessment Standardized Balance Assessment: Berg Balance Test Berg Balance Test Sit to Stand: Able to stand without using hands and stabilize independently Standing Unsupported: Able to stand 2 minutes with supervision Sitting with Back Unsupported but Feet Supported on Floor or Stool: Able to sit safely and securely 2 minutes Stand to Sit: Sits safely with minimal use of hands Transfers: Able to transfer with verbal cueing and /or supervision Standing Unsupported with Eyes Closed: Able to stand 10 seconds with supervision Standing Ubsupported with Feet Together: Able to place feet together independently and stand for 1 minute with supervision From Standing, Reach Forward with Outstretched Arm: Reaches forward but needs supervision From Standing Position, Pick up Object from Floor: Able to pick up shoe, needs supervision From Standing Position, Turn to Look Behind Over each Shoulder: Needs supervision when turning Turn 360 Degrees: Needs close supervision or verbal cueing Standing Unsupported, Alternately Place Feet on Step/Stool: Able to complete 4 steps without aid or supervision Standing Unsupported, One Foot in Front: Loses balance while stepping or standing(pt elects to attempt tandem stance, demonstrating impaired awareness of deficits) Standing  on One Leg: Tries to lift leg/unable to hold 3 seconds but remains standing independently Total Score:  32    Therapy/Group: Individual Therapy  Waunita Schooner 04/16/2019, 12:22 PM

## 2019-04-17 ENCOUNTER — Inpatient Hospital Stay (HOSPITAL_COMMUNITY): Payer: Medicaid Other | Admitting: Occupational Therapy

## 2019-04-17 ENCOUNTER — Inpatient Hospital Stay (HOSPITAL_COMMUNITY): Payer: Medicaid Other | Admitting: Physical Therapy

## 2019-04-17 ENCOUNTER — Inpatient Hospital Stay (HOSPITAL_COMMUNITY): Payer: Medicaid Other | Admitting: Speech Pathology

## 2019-04-17 MED ORDER — METHYLPHENIDATE HCL 5 MG PO TABS
5.0000 mg | ORAL_TABLET | Freq: Two times a day (BID) | ORAL | Status: DC
  Administered 2019-04-17 – 2019-04-27 (×21): 5 mg via ORAL
  Filled 2019-04-17 (×21): qty 1

## 2019-04-17 NOTE — Progress Notes (Signed)
[facility] PHYSICAL MEDICINE & REHABILITATION PROGRESS NOTE   Subjective/Complaints:  Had a good day with therapy. Able to sleep last night. Feeling stronger  ROS: Patient denies fever, rash, sore throat, blurred vision, nausea, vomiting, diarrhea, cough, shortness of breath or chest pain, joint or back pain, headache, or mood change.    Objective:   No results found. Recent Labs    04/16/19 0520  WBC 3.2*  HGB 13.9  HCT 44.1  PLT 101*   Recent Labs    04/16/19 0520  NA 141  K 5.1  CL 108  CO2 25  GLUCOSE 96  BUN 12  CREATININE 1.06  CALCIUM 9.3    Intake/Output Summary (Last 24 hours) at 04/17/2019 0924 Last data filed at 04/17/2019 0846 Gross per 24 hour  Intake 880 ml  Output -  Net 880 ml     Physical Exam: Vital Signs Blood pressure (!) 147/61, pulse 66, temperature 98 F (36.7 C), temperature source Oral, resp. rate 16, height 6' (1.829 m), weight (!) 150.3 kg, SpO2 99 %. Constitutional: No distress . Vital signs reviewed. obese HEENT: EOMI, oral membranes moist Neck: supple Cardiovascular: RRR without murmur. No JVD    Respiratory: CTA Bilaterally without wheezes or rales. Normal effort    GI: BS +, non-tender, non-distended  Musculoskeletal:     Comments: LB TTP. Tr to 1+ edema b/l LE Left shoulder abduction limited to around 60 deg Neurological: improved insight, tangential and distracted easily.  Speech clear. (+) Asterixis. Motor 4/5 UE, 3+ prox to 4/5 distally in LE's. No focal sensory findings.  Skin: Skin is warm and dry. He is not diaphoretic. tattoo left calf Venous stasis changes in LEs above ankle B/L perhaps a little better? A few areas of eschar on dorsum of right foot, toe nail injury/old blood    Psychiatric:  Pleasant and cooperative but still tangential and verbose    Assessment/Plan: 1. Functional deficits secondary to hepatic encephalopathy which require 3+ hours per day of interdisciplinary therapy in a comprehensive inpatient  rehab setting.  Physiatrist is providing close team supervision and 24 hour management of active medical problems listed below.  Physiatrist and rehab team continue to assess barriers to discharge/monitor patient progress toward functional and medical goals  Care Tool:  Bathing    Body parts bathed by patient: Right arm, Left arm, Chest, Abdomen, Buttocks, Front perineal area, Right upper leg, Left upper leg, Face   Body parts bathed by helper: Right lower leg, Left lower leg     Bathing assist Assist Level: Supervision/Verbal cueing     Upper Body Dressing/Undressing Upper body dressing   What is the patient wearing?: Pull over shirt    Upper body assist Assist Level: Supervision/Verbal cueing Assistive Device Comment: no clothing available  Lower Body Dressing/Undressing Lower body dressing      What is the patient wearing?: Pants     Lower body assist Assist for lower body dressing: Minimal Assistance - Patient > 75%     Toileting Toileting    Toileting assist Assist for toileting: Independent     Transfers Chair/bed transfer  Transfers assist     Chair/bed transfer assist level: Supervision/Verbal cueing     Locomotion Ambulation   Ambulation assist      Assist level: Independent Assistive device: Walker-rolling Max distance: 32ft   Walk 10 feet activity   Assist     Assist level: Independent Assistive device: Walker-rolling   Walk 50 feet activity   Assist  Assist level: Minimal Assistance - Patient > 75% Assistive device: Walker-rolling    Walk 150 feet activity   Assist    Assist level: Contact Guard/Touching assist(without turns) Assistive device: Walker-rolling    Walk 10 feet on uneven surface  activity   Assist Walk 10 feet on uneven surfaces activity did not occur: Safety/medical concerns         Wheelchair     Assist Will patient use wheelchair at discharge?: No             Wheelchair 50 feet  with 2 turns activity    Assist            Wheelchair 150 feet activity     Assist          Blood pressure (!) 147/61, pulse 66, temperature 98 F (36.7 C), temperature source Oral, resp. rate 16, height 6' (1.829 m), weight (!) 150.3 kg, SpO2 99 %. Medical Problem List and Plan: 1.  Functional deficits/balance deficits  secondary to hepatic encephalopathy  --Continue CIR therapies including PT, OT, and SLP   -team conference today 2.  Antithrombotics: -DVT/anticoagulation:  Pharmaceutical: Continue Lovenox 80 mg   -platelets holding 101k 11/2  -antiplatelet therapy: N/A 3. Pain Management:   ice, heat, topical modalities as needed 4. Mood: LCSW to follow for evaluation and support.              -antipsychotic agents: N/A 5. Neuropsych: This patient appears to be currently capable of making decisions on his own behalf.  -appreciate neuropsych eval  -I think a trial of ritalin is reasonable given his obvious attention issues which can be functionally inhibitive for him 6. Skin/Wound Care: Routine pressure relief measures.  7. Fluids/Electrolytes/Nutrition: Monitor I/O. Offer protein supplement for boderline albumin. 8. HIV with progressive neutropenia: On Descovy and Pifeltro.  9. Cirrhosis of liver: ammonia up to 123 today 10/30  -lactulose increased to 30mg  qid  -ammonia level 91 11/2  -platelets remain stable  -LFT's normal 10. Ascites: Monitor daily weights. On Midamor and Lasix resumed 10/27   Filed Weights   04/15/19 0523 04/16/19 0502 04/17/19 0500  Weight: (!) 150.3 kg (!) 149 kg (!) 150.3 kg    -lasix 80mg  daily currently  -11/3 weights stable 11. Hypokalemia: has history of medication non compliance per records and per K+ was 2.6 on admission  -potassium elevated 11/2---reduced to qd 12. Acute on chronic insomnia: He has used elavil 100 mg in the past--+/- effective.   -continue scheduled trazodone (100mg ) and titrate up as needed.   -sleeping  better 13. Medical Noncompliance- has hx of not taking medications correctly.   -attention is directly related to this 14. Morbid Obesity- BMI 46- weight 155 kg-   bariatric bed and use bariatric equipment for pt-  15. 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 Venous stasis- changes on legs- will monitor for insufficiency ulcers.  17. GERD-  Protonix 40 mg daily 18. Nicotine dependence- continue Nicotine patch 7 mg daily    LOS: 5 days A FACE TO FACE EVALUATION WAS PERFORMED  Meredith Staggers 04/17/2019, 9:24 AM

## 2019-04-17 NOTE — Progress Notes (Signed)
Speech Language Pathology Daily Session Note  Patient Details  Name: [name] MRN: [id number] Date of Birth: 03-31-1966  Today's Date: 04/17/2019 SLP Individual Time: [id number] SLP Individual Time Calculation (min): 55 min  Short Term Goals: Week 1: SLP Short Term Goal 1 (Week 1): STG=LTG due to short ELOS  Skilled Therapeutic Interventions: Skilled treatment session focused on cognitive goals. SLP facilitated session by providing extra time and supervision level verbal cues for organization during a complex calendar/appointment making task. Patient demonstrated appropriate selective attention to task for ~30 minutes. Patient requested to use the bathroom and required supervision level verbal cues for problem solving with task. Patient was continent of urine. Patient left upright in bed with alarm on and all needs within reach. Continue with current plan of care.      Pain No/Denies Pain   Therapy/Group: Individual Therapy  [name] 04/17/2019, 10:06 AM

## 2019-04-17 NOTE — Progress Notes (Signed)
Occupational Therapy Session Note  Patient Details  Name: [name] MRN: [id number] Date of Birth: Sep 09, 1965  Today's Date: 04/17/2019 OT Individual Time: 1100-1155 OT Individual Time Calculation (min): 55 min   Short Term Goals: Week 1:  OT Short Term Goal 1 (Week 1): STG = LTG  Skilled Therapeutic Interventions/Progress Updates:    Pt greeted standing up at the sink washing his face without assistance. OT asked pt what he was doing standing up unassisted. PT stated "I had to pee." Pt unaware of safety precautions. OT educated on importance of having staff with him at all times when OOB 2/2 high fall risk. Pt reported understanding, but still has very little safety awareness. PT declined bathing/dressing today stating he bathed yesterday. Pt ambulated to wc with RW and close supervision. Pt brought down to therapy gym and discussed home bathroom set-up. Introduced pt to tub bench and practice tub bench transfer with close supervision. Then practiced stepping over tub ledge to shower seat simulated like he has it already at home. Pt needed set-up A, verbal cues for technique and CGA for balance. Discussed use of grab bars and safety awareness within transfer. OT discussed kitchen set-up and home modifications for simple meal prep at home. OT provided pt with RW bag and educated on uses to increase safety within BADL tasks. OT placed fake fruit into microwave, freezer, fridge, and upper/lower cabinets. Pt needed verbal cues for RW positioning and safety when accessing kitchen, but overall supervision. Pt took rest break, then ambulated 30 feet with RW and close supervision. Pt left seated in recliner with chair alarm on and needs met awaiting lunch.   Therapy Documentation Precautions:  Precautions Precautions: Fall Precaution Comments: has significant mm tremors with mobility, improved with walking Restrictions Weight Bearing Restrictions: No Other Position/Activity Restrictions: chair  with gait activity for safety Pain:   denies pain  Therapy/Group: Individual Therapy  Valma Cava 04/17/2019, 11:30 AM
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
## 2019-04-17 NOTE — Progress Notes (Signed)
Physical Therapy Session Note  Patient Details  Name: [name] MRN: [id number] Date of Birth: [birth year]  Today's Date: 04/17/2019 PT Individual Time: 1418-1530 PT Individual Time Calculation (min): 72 min   Short Term Goals: Week 1:  PT Short Term Goal 1 (Week 1): =LTGS  Skilled Therapeutic Interventions/Progress Updates:  Pt received in bathroom with NT supervision & therapist taking over. After pt finished with continent void & BM pt performed peri hygiene with setup assist & toilet transfer with supervision. Pt ambulates out of bathroom with RW & supervision. Pt stands to perform hand hygiene with supervision and cuing to square up to sink. Transported pt to dayroom via w/c dependent assist for time management. Pt transfers w/c>mat table with RW & supervision via stand pivot, sit<>supine with supervision, with pt requiring max cuing for technique for supine>sitting EOB. Pt requesting to perform BLE stretches, so therapist instructed pt on how to perform BLE hamstring stretches with sheet. Pt ambulates 300 ft with RW & supervision. Pt demonstrates improved recall (only requiring min cuing on this date) for hand placement for sit<>stand transfers. Pt performed standing cone taps with min/mod assist for standing balance with max cuing for technique and task focusing on weight shifting L<>R, BLE coordination, and dynamic balance. Pt negotiates 8 steps (6") with B rails & supervision with step to pattern and decreased speed overall. Pt performed blocked practice (10x) sit<>stand with supervision with focus on hand placement for safety during transfers. Pt ambulates back to room with supervision and demonstrates impaired safety awareness as he attempts to retreive item from floor while UE are supported on rolling tray table with therapist providing education re: safety. Pt left in recliner with chair alarm donned & call bell in reach & instructions not to get up without assistance.   Pt with  ongoing impaired awareness as he attempts to stand without therapist assistance throughout session despite max education for safety.  Therapy Documentation Precautions:  Precautions Precautions: Fall Precaution Comments: has significant mm tremors with mobility, improved with walking Restrictions Weight Bearing Restrictions: No Other Position/Activity Restrictions: chair with gait activity for safety  Pain: Pt c/o 1 instance of shooting pain down his back that instantly went away.  At end of session pt requesting heating pad for low back 2/2 8/10 tightness & therapist provided it wrapped in a towel with RN clearance.    Therapy/Group: Individual Therapy  Waunita Schooner 04/17/2019, 3:37 PM

## 2019-04-18 ENCOUNTER — Inpatient Hospital Stay (HOSPITAL_COMMUNITY): Payer: Medicaid Other

## 2019-04-18 ENCOUNTER — Inpatient Hospital Stay (HOSPITAL_COMMUNITY): Payer: Medicaid Other | Admitting: Speech Pathology

## 2019-04-18 ENCOUNTER — Inpatient Hospital Stay (HOSPITAL_COMMUNITY): Payer: Medicaid Other | Admitting: Physical Therapy

## 2019-04-18 LAB — BASIC METABOLIC PANEL
Anion gap: 10 (ref 5–15)
BUN: 12 mg/dL (ref 6–20)
CO2: 25 mmol/L (ref 22–32)
Calcium: 9.1 mg/dL (ref 8.9–10.3)
Chloride: 104 mmol/L (ref 98–111)
Creatinine, Ser: 1.07 mg/dL (ref 0.61–1.24)
GFR calc Af Amer: >60 mL/min (ref >60)
GFR calc non Af Amer: >60 mL/min (ref >60)
Glucose, Bld: 102 mg/dL — ABNORMAL HIGH (ref 70–99)
Potassium: 3.9 mmol/L (ref 3.5–5.1)
Sodium: 139 mmol/L (ref 135–145)

## 2019-04-18 MED ORDER — NICOTINE POLACRILEX 2 MG MT GUM
2.0000 mg | CHEWING_GUM | OROMUCOSAL | Status: DC | PRN
  Administered 2019-04-18 (×2): 2 mg via ORAL
  Filled 2019-04-18 (×6): qty 1

## 2019-04-18 MED ORDER — NICOTINE POLACRILEX 2 MG MT GUM
2.0000 mg | CHEWING_GUM | OROMUCOSAL | Status: DC | PRN
  Administered 2019-04-19 – 2019-04-23 (×10): 6 mg via ORAL
  Administered 2019-04-23: 4 mg via ORAL
  Administered 2019-04-24 – 2019-04-27 (×6): 6 mg via ORAL
  Filled 2019-04-18 (×3): qty 3
  Filled 2019-04-18: qty 1
  Filled 2019-04-18 (×11): qty 3
  Filled 2019-04-18: qty 1
  Filled 2019-04-18 (×7): qty 3

## 2019-04-18 MED ORDER — BIOTENE DRY MOUTH MT LIQD: 15.0000 mL | OROMUCOSAL | Status: DC | PRN

## 2019-04-18 NOTE — Progress Notes (Signed)
Physical Therapy Discharge Summary  Patient Details  Name: [name] MRN: [id number] Date of Birth: [birth year]  Today's Date: 04/18/2019 PT Individual Time:  -       Patient has met 8 of 9 long term goals due to improved activity tolerance, improved balance, improved postural control, increased strength, ability to compensate for deficits and improved coordination.  Patient to discharge at an ambulatory level supervision with RW.   No family has been present for caregiver training and it is questionable whether pt has the recommended 24 hr supervision upon d/c. This therapist recommends pt have 24 hr supervision at d/c 2/2 impaired balance, as noted by Merrilee Jansky Balance Test score of 32/56 on 04/16/2019, and significantly impaired cognition (memory & awareness).   Reasons goals not met: pt requires heavy cuing for anticipatory awareness during functional mobility tasks 2/2 impaired cognition  Recommendation:  Patient will benefit from ongoing skilled PT services in home health setting to continue to advance safe functional mobility, address ongoing impairments in balance, endurance, gait, safety awareness, and minimize fall risk.  Equipment: RW  Reasons for discharge: treatment goals met and discharge from hospital  Patient/family agrees with progress made and goals achieved: Yes  PT Discharge Precautions/Restrictions Precautions Precautions: Fall Precaution Comments: mm tremors with mobility Restrictions Weight Bearing Restrictions: No  Vision/Perception      Cognition Overall Cognitive Status: Impaired/Different from baseline Arousal/Alertness: Awake/alert Orientation Level: Oriented X4 Attention: Sustained Sustained Attention: Impaired Sustained Attention Impairment: Verbal basic(pt internally distracted with tangential conversation throughout most PT sessions) Memory Impairment: Decreased recall of new information;Decreased short term memory Awareness:  Impaired Awareness Impairment: Emergent impairment;Anticipatory impairment Problem Solving: Impaired Problem Solving Impairment: Functional basic;Verbal basic Executive Function: Self Monitoring;Decision Making Decision Making: Impaired Self Monitoring: Impaired Self Monitoring Impairment: Functional basic Behaviors: Impulsive;Perseveration Safety/Judgment: Impaired Comments: tangental speech  Sensation Sensation Light Touch: Appears Intact Coordination Gross Motor Movements are Fluid and Coordinated: No Fine Motor Movements are Fluid and Coordinated: No Coordination and Movement Description: tremors in all extremities  Motor  Motor Motor: Abnormal postural alignment and control Motor - Discharge Observations: generalized weakness/deconditioning   Mobility Bed Mobility Bed Mobility: Rolling Right;Rolling Left;Right Sidelying to Sit;Sit to Supine Rolling Right: Supervision/verbal cueing Rolling Left: Supervision/Verbal cueing Right Sidelying to Sit: Supervision/Verbal cueing Left Sidelying to Sit: Supervision/Verbal cueing Sitting - Scoot to Edge of Bed: Supervision/Verbal cueing Sit to Supine: Supervision/Verbal cueing Sit to Sidelying Left: Supervision/Verbal cueing Transfers Transfers: Sit to Stand;Stand to Sit;Stand Pivot Transfers Sit to Stand: Supervision/Verbal cueing Stand to Sit: Supervision/Verbal cueing Stand Pivot Transfers: Supervision/Verbal cueing Stand Pivot Transfer Details: Verbal cues for sequencing Stand Pivot Transfer Details (indicate cue type and reason): limited recall, decreased safety awreness Transfer (Assistive device): Rolling walker Locomotion  Gait Ambulation: Yes Gait Assistance: Supervision/Verbal cueing Gait Distance (Feet): 150 Feet Assistive device: Rolling walker Gait Gait: No Gait Pattern: Impaired Gait velocity: decreased Stairs / Additional Locomotion Stairs: Yes Stairs Assistance: Supervision/Verbal cueing Stair  Management Technique: Two rails Number of Stairs: 8 Height of Stairs: 6(inches) Ramp: Supervision/Verbal cueing(ambulatory with RW) Wheelchair Mobility Wheelchair Mobility: No   Trunk/Postural Assessment  Cervical Assessment Cervical Assessment: Within Functional Limits Thoracic Assessment Thoracic Assessment: Within Functional Limits Lumbar Assessment Lumbar Assessment: Within Functional Limits Postural Control Postural Control: Deficits on evaluation Righting Reactions: slightly delayed Protective Responses: slightly delayed   Balance Balance Balance Assessed: Yes Dynamic Standing Balance Dynamic Standing - Level of Assistance: 5: Stand by assistance(gait with BUE support on RW)  [employer] on  04/16/2019: 32/56   Extremity Assessment  RUE Assessment RUE Assessment: Within Functional Limits LUE Assessment LUE Assessment: Within Functional Limits RLE Assessment RLE Assessment: Within Functional Limits LLE Assessment LLE Assessment: Within Functional Limits    Waunita Schooner 04/18/2019, 8:38 AM

## 2019-04-18 NOTE — Progress Notes (Signed)
Physical Therapy Session Note  Patient Details  Name: [name] MRN: [id number] Date of Birth: [birth year]  Today's Date: 04/18/2019 PT Individual Time: 1106-1203 PT Individual Time Calculation (min): 57 min   Short Term Goals: Week 1:  PT Short Term Goal 1 (Week 1): =LTGS  Skilled Therapeutic Interventions/Progress Updates:  Pt received in w/c & agreeable to tx. Pt transfers sit<>stand with supervision during session. Pt ambulates room>gym>dayroom>room with RW & supervision. Pt performed standing trampoline ball toss with wide & normal BOS & supervision overall. Pt performed sit<>stand from EOM while holding ball with BUE with task focusing on eccentric control with pt reporting feeling a "twinge" in his back with exercises so exercises ended. Pt transferred sit>supine with mod I, supine>sit with supervision & cuing for technique, and performed lumbar rotation stretching & BLE hamstring stretching with sheet. Pt reports his back feels "better" after performing stretches. Pt utilizes nu-step on level 3 x 10 minutes with all four extremities with task focusing on global strengthening & endurance training. Back in room, pt ambulates into bathroom and completes toilet transfer with supervision and clothing management with supervision. Pt with continent void on toilet and performs hand hygiene standing at sink with supervision.  Pt left in w/c with chair alarm donned & all needs in reach.  Therapy Documentation Precautions:  Precautions Precautions: Fall Precaution Comments: mm tremors with mobility Restrictions Weight Bearing Restrictions: No Other Position/Activity Restrictions: chair with gait activity for safety    Therapy/Group: Individual Therapy  Waunita Schooner 04/18/2019, 12:43 PM

## 2019-04-18 NOTE — Progress Notes (Signed)
Occupational Therapy Session Note  Patient Details  Name: [name] MRN: [id number] Date of Birth: 1966-03-22  1300-1410 47 min  Short Term Goals: Week 1:  OT Short Term Goal 1 (Week 1): STG = LTG  Skilled Therapeutic Interventions/Progress Updates:    1;1. Pt received in bed reporting need to toilet. Pt completes all functional mobility with RW with supervision and VC for safety awareness, RW mangement and hand placement. Pt completes toileting, bathing and dressing with supervision. OT demo use of sock aide to don sock d/t back pain and pt reports liking AE. Pt completes laundry task with supervision with VC for RW management and body positioning in relation to washer to decreasing twisting and bending which puts stress on back. Pt completes BLE HEP with blue theraband to increase BL Estrength required for BADLs and transfers Exited sessionw iht pt seated in bed, exit alamr on and call light in reach  Therapy Documentation Precautions:  Precautions Precautions: Fall Precaution Comments: mm tremors with mobility Restrictions Weight Bearing Restrictions: No Other Position/Activity Restrictions: chair with gait activity for safety General:   Vital Signs: Therapy Vitals Temp: 98.7 F (37.1 C) Temp Source: Oral Pulse Rate: 76 Resp: 19 BP: 136/79 Patient Position (if appropriate): Lying Oxygen Therapy SpO2: 96 % O2 Device: Room Air Pain:   ADL: ADL Eating: Set up Where Assessed-Eating: Chair Grooming: Contact guard Where Assessed-Grooming: Standing at sink Upper Body Bathing: Setup Where Assessed-Upper Body Bathing: Sitting at sink Lower Body Bathing: Moderate assistance Where Assessed-Lower Body Bathing: Sitting at sink Upper Body Dressing: Setup Where Assessed-Upper Body Dressing: Wheelchair Lower Body Dressing: Moderate assistance Where Assessed-Lower Body Dressing: Wheelchair Toileting: Minimal assistance, Minimal cueing Where Assessed-Toileting:  Glass blower/designer: Psychiatric nurse Method: Counselling psychologist: Grab bars ADL Comments: UB dressing with gown only as no other clothing available, cues for sequencing and safety, mild tremor L > R Vision   Perception    Praxis   Exercises:   Other Treatments:     Therapy/Group: Individual Therapy  Tonny Branch 04/18/2019, 3:17 PM

## 2019-04-18 NOTE — Progress Notes (Signed)
Speech Language Pathology Daily Session Note  Patient Details  Name: DORWIN CHAUNCEY MRN: [id number] Date of Birth: 05-19-1966  Today's Date: 04/18/2019 SLP Individual Time: [id number] SLP Individual Time Calculation (min): 55 min  Short Term Goals: Week 1: SLP Short Term Goal 1 (Week 1): STG=LTG due to short ELOS  Skilled Therapeutic Interventions: Skilled treatment session focused on cognitive goals. SLP facilitated a functional conversation that focused on anticipatory awareness and d/c planning. Patient required Max A verbal cues for problem solving and reasoning in regards to generating a list of people who may be able to assist at discharge. Patient perseverating on wanting to stay in [place] due to a potential apartment opening with Max verbal cues again needed for awareness in regards to why living in an apartment by himself within the next 1-2 weeks is not a safe decision based on our recommendations for 24 hour supervision. Patient required Mod verbal cues to initiate a "to-do" list to organize thoughts/tasks that need to be completed today with short-term deficits noted by patient repeating stories he had told the clinician yesterday. Patient left upright in bed with alarm on and all needs within reach. Continue with current plan of care.      Pain No/Denies Pain   Therapy/Group: Individual Therapy  Lenon Kuennen 04/18/2019, 12:18 PM

## 2019-04-18 NOTE — Progress Notes (Signed)
[facility] PHYSICAL MEDICINE & REHABILITATION PROGRESS NOTE   Subjective/Complaints:  [name] is feeling very well and hopeful. He says he feels 100% better than when he came in. He feels that the Adderal is helping him to concentrate better. He shares his concerns with being able to return to work given his left shoulder dysfunction. He is cognizant of the dietary recommendations provided to him and asks whether he should implement these now or after the holidays.   BMP this morning is stable.   ROS: Patient denies fever, rash, sore throat, blurred vision, nausea, vomiting, diarrhea, cough, shortness of breath or chest pain, joint or back pain, headache, or mood change.    Objective:   No results found. Recent Labs    04/16/19 0520  WBC 3.2*  HGB 13.9  HCT 44.1  PLT 101*   Recent Labs    04/16/19 0520 04/18/19 0527  NA 141 139  K 5.1 3.9  CL 108 104  CO2 25 25  GLUCOSE 96 102*  BUN 12 12  CREATININE 1.06 1.07  CALCIUM 9.3 9.1    Intake/Output Summary (Last 24 hours) at 04/18/2019 [id number] Last data filed at 04/18/2019 0700 Gross per 24 hour  Intake 1146 ml  Output -  Net 1146 ml     Physical Exam: Vital Signs Blood pressure (!) 146/80, pulse 72, temperature 97.6 F (36.4 C), temperature source Oral, resp. rate 17, height 6' (1.829 m), weight (!) 148.3 kg, SpO2 100 %. Constitutional: No distress . Vital signs reviewed. Obese, lying in bed, very pleasant HEENT: EOMI, oral membranes moist Neck: supple Cardiovascular: RRR without murmur. No JVD    Respiratory: CTA Bilaterally without wheezes or rales. Normal effort    GI: BS +, non-tender, non-distended  Musculoskeletal:     Comments: LB TTP. Tr to 1+ edema b/l LE Left shoulder abduction limited to around 70 deg Neurological: improved insight, tangential and distracted easily.  Speech clear. (+) Asterixis. Motor 4/5 UE, 3+ prox to 4/5 distally in LE's. No focal sensory findings.  Skin: Skin is warm and dry. He is  not diaphoretic. tattoo left calf Venous stasis changes in LEs above ankle B/L. A few areas of eschar on dorsum of right foot, toe nail injury/old blood    Psychiatric:  Pleasant and cooperative but still tangential and verbose    Assessment/Plan: 1. Functional deficits secondary to hepatic encephalopathy which require 3+ hours per day of interdisciplinary therapy in a comprehensive inpatient rehab setting.  Physiatrist is providing close team supervision and 24 hour management of active medical problems listed below.  Physiatrist and rehab team continue to assess barriers to discharge/monitor patient progress toward functional and medical goals  Care Tool:  Bathing    Body parts bathed by patient: Right arm, Left arm, Chest, Abdomen, Buttocks, Front perineal area, Right upper leg, Left upper leg, Face   Body parts bathed by helper: Right lower leg, Left lower leg     Bathing assist Assist Level: Supervision/Verbal cueing     Upper Body Dressing/Undressing Upper body dressing   What is the patient wearing?: Pull over shirt    Upper body assist Assist Level: Supervision/Verbal cueing Assistive Device Comment: no clothing available  Lower Body Dressing/Undressing Lower body dressing      What is the patient wearing?: Pants     Lower body assist Assist for lower body dressing: Minimal Assistance - Patient > 75%     Toileting Toileting    Toileting assist Assist for toileting:  Independent     Transfers Chair/bed transfer  Transfers assist     Chair/bed transfer assist level: Supervision/Verbal cueing     Locomotion Ambulation   Ambulation assist      Assist level: Supervision/Verbal cueing Assistive device: Walker-rolling Max distance: 300 ft   Walk 10 feet activity   Assist     Assist level: Supervision/Verbal cueing Assistive device: Walker-rolling   Walk 50 feet activity   Assist    Assist level: Supervision/Verbal cueing Assistive  device: Walker-rolling    Walk 150 feet activity   Assist    Assist level: Supervision/Verbal cueing Assistive device: Walker-rolling    Walk 10 feet on uneven surface  activity   Assist Walk 10 feet on uneven surfaces activity did not occur: Safety/medical concerns         Wheelchair     Assist Will patient use wheelchair at discharge?: No             Wheelchair 50 feet with 2 turns activity    Assist            Wheelchair 150 feet activity     Assist          Blood pressure (!) 146/80, pulse 72, temperature 97.6 F (36.4 C), temperature source Oral, resp. rate 17, height 6' (1.829 m), weight (!) 148.3 kg, SpO2 100 %. Medical Problem List and Plan: 1.  Functional deficits/balance deficits  secondary to hepatic encephalopathy  --Continue CIR therapies including PT, OT, and SLP   -team conference conducted yesterday.  2.  Antithrombotics: -DVT/anticoagulation:  Pharmaceutical: Continue Lovenox 80 mg   -platelets holding 101k 11/2  -antiplatelet therapy: N/A 3. Pain Management:   ice, heat, topical modalities as needed 4. Mood: LCSW to follow for evaluation and support.              -antipsychotic agents: N/A 5. Neuropsych: This patient appears to be currently capable of making decisions on his own behalf.  -appreciate neuropsych eval  -I think a trial of ritalin is reasonable given his obvious attention issues which can be functionally inhibitive for him 6. Skin/Wound Care: Routine pressure relief measures.  7. Fluids/Electrolytes/Nutrition: Monitor I/O. Offer protein supplement for boderline albumin. 8. HIV with progressive neutropenia: On Descovy and Pifeltro.  9. Cirrhosis of liver: ammonia up to 123 today 10/30  -lactulose increased to 30mg  qid  -ammonia level 91 11/2  -platelets remain stable  -LFT's normal 10. Ascites: Monitor daily weights. On Midamor and Lasix resumed 10/27   Filed Weights   04/16/19 0502 04/17/19 0500  04/18/19 0435  Weight: (!) 149 kg (!) 150.3 kg (!) 148.3 kg    -lasix 80mg  daily currently  -11/3 weights stable  -11/4 weight decreased from 150.3kg to 148.3kg.  11. Hypokalemia: has history of medication non compliance per records and per K+ was 2.6 on admission  -potassium elevated 11/2---reduced to qd 12. Acute on chronic insomnia: He has used elavil 100 mg in the past--+/- effective.   -continue scheduled trazodone (100mg ) and titrate up as needed.   -sleeping better 13. Medical Noncompliance- has hx of not taking medications correctly.   -attention is directly related to this 14. Morbid Obesity- BMI 46- weight 155 kg-   bariatric bed and use bariatric equipment for pt-  15. 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 Venous stasis- changes on legs- will monitor for insufficiency ulcers.  17. GERD-  Protonix 40 mg  daily 18. Nicotine dependence- continue Nicotine patch 7 mg daily 19. Biotene for dry mouth.  20. Diet: Discussed the dietary advise given by his hepatologist. [name] was able to repeat back much of the advice with good understanding. He has made goals to change his diet, such as eating more sweet rather than white potatoes, decreasing sweets, and eating fish twice per week rather than beef.     LOS: 6 days A FACE TO FACE EVALUATION WAS PERFORMED  Martha Clan P Paulkar 04/18/2019, 8:22 AM

## 2019-04-18 NOTE — Progress Notes (Signed)
Social Work Patient ID: [name], male   DOB: 02-13-1966, 53 y.o.   MRN: [id number]   Have spoken with pt several times since team conference yesterday.  He is aware of targeted d/c date of 11/6 and recommendation for 24/7 supervision.  He continues to offer various ideas of where he might be able to go or people who might be able to stay with him, however, STILL no confirmed plan.  I explained that the only other option I can offer is [facility].  Pt didn't decline, however, states he "would rather not."  He is currently on the phone working on arrangements.  His thought processes continue to be disjointed and tangential but relatively on target.  He did give permission for me to contact his brother and I have left a VM requesting call back to discuss the situation and the urgency with pending d/c in two days.  Will keep team posted.  Mekel Haverstock, LCSW

## 2019-04-19 ENCOUNTER — Inpatient Hospital Stay (HOSPITAL_COMMUNITY): Payer: Medicaid Other

## 2019-04-19 ENCOUNTER — Other Ambulatory Visit: Payer: Self-pay

## 2019-04-19 ENCOUNTER — Inpatient Hospital Stay (HOSPITAL_COMMUNITY): Payer: Medicaid Other | Admitting: Occupational Therapy

## 2019-04-19 ENCOUNTER — Inpatient Hospital Stay (HOSPITAL_COMMUNITY): Payer: Medicaid Other | Admitting: Speech Pathology

## 2019-04-19 LAB — COMPREHENSIVE METABOLIC PANEL
ALT: 25 U/L (ref 0–44)
AST: 28 U/L (ref 15–41)
Albumin: 3.5 g/dL (ref 3.5–5.0)
Alkaline Phosphatase: 87 U/L (ref 38–126)
Anion gap: 10 (ref 5–15)
BUN: 12 mg/dL (ref 6–20)
CO2: 23 mmol/L (ref 22–32)
Calcium: 9.3 mg/dL (ref 8.9–10.3)
Chloride: 105 mmol/L (ref 98–111)
Creatinine, Ser: 1.22 mg/dL (ref 0.61–1.24)
GFR calc Af Amer: >60 mL/min (ref >60)
GFR calc non Af Amer: >60 mL/min (ref >60)
Glucose, Bld: 100 mg/dL — ABNORMAL HIGH (ref 70–99)
Potassium: 3.7 mmol/L (ref 3.5–5.1)
Sodium: 138 mmol/L (ref 135–145)
Total Bilirubin: 1.5 mg/dL — ABNORMAL HIGH (ref 0.3–1.2)
Total Protein: 6.5 g/dL (ref 6.5–8.1)

## 2019-04-19 LAB — AMMONIA: Ammonia: 56 umol/L — ABNORMAL HIGH (ref 9–35)

## 2019-04-19 MED ORDER — NAPHAZOLINE-GLYCERIN 0.012-0.2 % OP SOLN
1.0000 [drp] | Freq: Four times a day (QID) | OPHTHALMIC | Status: DC | PRN
  Administered 2019-04-20: 08:00:00 2 [drp] via OPHTHALMIC
  Filled 2019-04-19: qty 15

## 2019-04-19 NOTE — Progress Notes (Signed)
[facility] PHYSICAL MEDICINE & REHABILITATION PROGRESS NOTE   Subjective/Complaints:  Pt c/o low back pain. States he had an ESI a few weeks ago. Thinking more clearly with methylphenidate  ROS: Patient denies fever, rash, sore throat, blurred vision, nausea, vomiting, diarrhea, cough, shortness of breath or chest pain,  headache, or mood change.     Objective:   No results found. No results for input(s): WBC, HGB, HCT, PLT in the last 72 hours. Recent Labs    04/18/19 0527 04/19/19 0527  NA 139 138  K 3.9 3.7  CL 104 105  CO2 25 23  GLUCOSE 102* 100*  BUN 12 12  CREATININE 1.07 1.22  CALCIUM 9.1 9.3    Intake/Output Summary (Last 24 hours) at 04/19/2019 1041 Last data filed at 04/19/2019 0900 Gross per 24 hour  Intake 960 ml  Output -  Net 960 ml     Physical Exam: Vital Signs Blood pressure (!) 151/77, pulse 73, temperature 97.9 F (36.6 C), temperature source Oral, resp. rate 16, height 6' (1.829 m), weight (!) 146.7 kg, SpO2 99 %. Constitutional: No distress . Vital signs reviewed. HEENT: EOMI, oral membranes moist Neck: supple Cardiovascular: RRR without murmur. No JVD    Respiratory: CTA Bilaterally without wheezes or rales. Normal effort    GI: BS +, non-tender, non-distended  Musculoskeletal:     Comments: LB remains TTP, some associated spasms. Tr LE edema Left shoulder abduction limited to around 70 deg Neurological: improved insight, tangential and distracted easily.  Speech clear. (+) Asterixis. Motor 4/5 UE, 3+ prox to 4/5 distally in LE's. No focal sensory findings.  Skin: Skin is warm and dry. He is not diaphoretic. tattoo left calf Venous stasis changes in LEs above ankle B/L. A few areas of eschar on dorsum of right foot, toe nail injury/old blood    Psychiatric:  Less tangential. pleasant    Assessment/Plan: 1. Functional deficits secondary to hepatic encephalopathy which require 3+ hours per day of interdisciplinary therapy in a  comprehensive inpatient rehab setting.  Physiatrist is providing close team supervision and 24 hour management of active medical problems listed below.  Physiatrist and rehab team continue to assess barriers to discharge/monitor patient progress toward functional and medical goals  Care Tool:  Bathing    Body parts bathed by patient: Right arm, Left arm, Chest, Abdomen, Buttocks, Front perineal area, Right upper leg, Left upper leg, Face, Right lower leg, Left lower leg   Body parts bathed by helper: Right lower leg, Left lower leg     Bathing assist Assist Level: Supervision/Verbal cueing     Upper Body Dressing/Undressing Upper body dressing   What is the patient wearing?: Pull over shirt    Upper body assist Assist Level: Set up assist Assistive Device Comment: no clothing available  Lower Body Dressing/Undressing Lower body dressing      What is the patient wearing?: Pants     Lower body assist Assist for lower body dressing: Supervision/Verbal cueing     Toileting Toileting    Toileting assist Assist for toileting: Supervision/Verbal cueing     Transfers Chair/bed transfer  Transfers assist     Chair/bed transfer assist level: Supervision/Verbal cueing     Locomotion Ambulation   Ambulation assist      Assist level: Supervision/Verbal cueing Assistive device: Walker-rolling Max distance: 200   Walk 10 feet activity   Assist     Assist level: Supervision/Verbal cueing Assistive device: Walker-rolling   Walk 50 feet activity  Assist    Assist level: Supervision/Verbal cueing Assistive device: Walker-rolling    Walk 150 feet activity   Assist    Assist level: Supervision/Verbal cueing Assistive device: Walker-rolling    Walk 10 feet on uneven surface  activity   Assist Walk 10 feet on uneven surfaces activity did not occur: Safety/medical concerns         Wheelchair     Assist Will patient use wheelchair at  discharge?: No             Wheelchair 50 feet with 2 turns activity    Assist            Wheelchair 150 feet activity     Assist          Blood pressure (!) 151/77, pulse 73, temperature 97.9 F (36.6 C), temperature source Oral, resp. rate 16, height 6' (1.829 m), weight (!) 146.7 kg, SpO2 99 %. Medical Problem List and Plan: 1.  Functional deficits/balance deficits  secondary to hepatic encephalopathy  --Continue CIR therapies including PT, OT, and SLP   -SW working on dispo. May stay with a friend 2.  Antithrombotics: -DVT/anticoagulation:  Pharmaceutical: Continue Lovenox 80 mg   -platelets holding 101k 11/2  -antiplatelet therapy: N/A 3. Pain Management:   ice, heat, topical modalities as needed 4. Mood: LCSW to follow for evaluation and support.              -antipsychotic agents: N/A 5. Neuropsych: This patient appears to be currently capable of making decisions on his own behalf.  -appreciate neuropsych eval  -continue ritalin trial for concentration 6. Skin/Wound Care: Routine pressure relief measures.  7. Fluids/Electrolytes/Nutrition: Monitor I/O.    -I personally reviewed all of the patient's labs today, and lab work is within normal limits.   8. HIV with progressive neutropenia: On Descovy and Pifeltro.  9. Cirrhosis of liver: ammonia up to 123 today 10/30  -lactulose increased to 30mg  qid  -ammonia level 91 11/2  -platelets remain stable  -LFT's normal 11/5 10. Ascites: Monitor daily weights. On Midamor and Lasix resumed 10/27   Filed Weights   04/17/19 0500 04/18/19 0435 04/19/19 0408  Weight: (!) 150.3 kg (!) 148.3 kg (!) 146.7 kg    -lasix 80mg  daily currently  -11/3 weights stable  -11/4 weight decreased from 150.3kg to 148.3kg.  11. Hypokalemia: has history of medication non compliance per records and per K+ was 2.6 on admission  -potassium 3.7 11/5---continue same supplement 12. Acute on chronic insomnia: He has used elavil 100 mg  in the past--+/- effective.   -continue scheduled trazodone (100mg ) and titrate up as needed.   -sleeping better 13. Medical Noncompliance- has hx of not taking medications correctly.   -attention is directly related to this 14. Morbid Obesity- BMI 46- weight 155 kg-   bariatric bed and use bariatric equipment for pt-  15. 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 Venous stasis- changes on legs- will monitor for insufficiency ulcers.  17. GERD-  Protonix 40 mg daily 18. Nicotine dependence- continue Nicotine patch 7 mg daily 19. Dry mouth: biotene.  20. Diet: working on better dietary choices as discussed yesterday    LOS: 7 days A FACE TO FACE EVALUATION WAS PERFORMED  Meredith Staggers 04/19/2019, 10:41 AM

## 2019-04-19 NOTE — Progress Notes (Signed)
Speech Language Pathology Daily Session Note  Patient Details  Name: SOROUSH DAI MRN: [id number] Date of Birth: 15-Sep-1965  Today's Date: 04/19/2019 SLP Individual Time: 1035-1100 SLP Individual Time Calculation (min): 25 min  Short Term Goals: Week 1: SLP Short Term Goal 1 (Week 1): STG=LTG due to short ELOS  Skilled Therapeutic Interventions: Skilled treatment session focused on cognitive goals. SLP facilitated session by providing education in regards to memory compensatory strategies and how to incorporate strategies at home to maximize recall and safety during functional tasks. Patient verbalized understanding but required Min A verbal cues for redirection to appropriate topics of conversation due to verbosity and tangents.  Patient left upright in bed with all needs within reach and alarm on. Continue with current plan of care.      Pain No/Denies Pain   Therapy/Group: Individual Therapy  Darreon Lutes 04/19/2019, 3:23 PM

## 2019-04-19 NOTE — Progress Notes (Deleted)
Occupational Therapy Discharge Summary  Patient Details  Name: [name] MRN: [id number] Date of Birth: 17-Jul-1965  Patient has met 12 of 15 long term goals due to improved activity tolerance, improved balance, postural control, ability to compensate for deficits, improved attention and improved awareness.  Patient to discharge at overall Supervision level.  Patient's care partner unavailable to provide the necessary assistance at discharge. Recommend dc to rest home.   Reasons goals not met: Pt continues to need supervision for functional transfers 2/2 poor safety awareness.  Recommendation:  Patient will benefit from ongoing skilled OT services in skilled nursing facility setting to continue to advance functional skills in the area of BADL.  Equipment: bariatric BSC  Reasons for discharge: treatment goals met and discharge from hospital  Patient/family agrees with progress made and goals achieved: Yes  OT Discharge Precautions/Restrictions  Precautions Precautions: Fall Restrictions Weight Bearing Restrictions: No Pain  denies pain ADL ADL Equipment Provided: Long-handled sponge Eating: Independent Where Assessed-Eating: Chair Grooming: Independent Where Assessed-Grooming: Standing at sink Upper Body Bathing: Independent Where Assessed-Upper Body Bathing: Sitting at sink Lower Body Bathing: Modified independent Where Assessed-Lower Body Bathing: Sitting at sink Upper Body Dressing: Independent Where Assessed-Upper Body Dressing: Wheelchair Lower Body Dressing: Supervision/safety Where Assessed-Lower Body Dressing: Wheelchair Toileting: Supervision/safety Where Assessed-Toileting: Glass blower/designer: Distant supervision Armed forces technical officer Method: Counselling psychologist: Energy manager: Close supervision ADL Comments: UB dressing with gown only as no other clothing available, cues for sequencing and safety, mild tremor L >  R Vision Eye Alignment: Within Functional Limits Perception  Perception: Within Functional Limits Praxis Praxis: Intact Cognition Overall Cognitive Status: Impaired/Different from baseline Arousal/Alertness: Awake/alert Orientation Level: Oriented X4 Self Monitoring: Impaired Behaviors: Impulsive;Perseveration Comments: tangental speech Sensation Sensation Light Touch: Appears Intact Proprioception: Appears Intact Coordination Gross Motor Movements are Fluid and Coordinated: No Fine Motor Movements are Fluid and Coordinated: No Coordination and Movement Description: tremors improved, mild coordination deficits continue Motor  Motor Motor - Discharge Observations: generalized weakness/deconditioning Mobility  Bed Mobility Bed Mobility: Rolling Right;Rolling Left;Right Sidelying to Sit;Sit to Supine Rolling Right: Supervision/verbal cueing Rolling Left: Supervision/Verbal cueing Right Sidelying to Sit: Supervision/Verbal cueing Left Sidelying to Sit: Supervision/Verbal cueing Sitting - Scoot to Edge of Bed: Supervision/Verbal cueing Sit to Supine: Supervision/Verbal cueing Sit to Sidelying Left: Supervision/Verbal cueing Transfers Sit to Stand: Supervision/Verbal cueing Stand to Sit: Supervision/Verbal cueing  Balance Berg Balance Test Standing on One Leg: Tries to lift leg/unable to hold 3 seconds but remains standing independently Static Sitting Balance Static Sitting - Balance Support: No upper extremity supported Static Sitting - Level of Assistance: 7: Independent Dynamic Sitting Balance Dynamic Sitting - Balance Support: No upper extremity supported Dynamic Sitting - Level of Assistance: 7: Independent Static Standing Balance Static Standing - Level of Assistance: 6: Modified independent (Device/Increase time) Dynamic Standing Balance Dynamic Standing - Level of Assistance: 5: Stand by assistance Extremity/Trunk Assessment RUE Assessment RUE Assessment: Within  Functional Limits General Strength Comments: 4+/5 LUE Assessment LUE Assessment: Within Functional Limits Passive Range of Motion (PROM) Comments: shoulder flex and abd to 90 after mobilization- baseline shoulder issues Active Range of Motion (AROM) Comments: able to ER to 45, flex and abd shouder to 70-80, distal [facility] General Strength Comments: elbow - hand 4+/5, shoulder painful AROM   [name] 04/20/2019, 1:32 PM

## 2019-04-19 NOTE — Progress Notes (Signed)
Physical Therapy Session Note  Patient Details  Name: [name] MRN: [id number] Date of Birth: 09/16/1965  Today's Date: 04/19/2019 PT Individual Time: 0830-0930 and 11:17 - 1202 PT Individual Time Calculation (min): 60 min and 45 min  Short Term Goals: Week 1:  PT Short Term Goal 1 (Week 1): =LTGS Week 2:    Week 3:     Skilled Therapeutic Interventions/Progress Updates:      Therapy Documentation Precautions:  Precautions Precautions: Fall Precaution Comments: mm tremors with mobility Restrictions Weight Bearing Restrictions: No Other Position/Activity Restrictions: chair with gait activity for safety    Other Treatments:     AM SESSION:  PAIN: 3/10 low back, instructed w/therex to address, provided w/written HEP  Pt initially in wc.  WC to bed SPT w/supervision.  Sit to supine w/ supervision.  Rolls w/supervision.  Supine to sit w/supervision.  SPT bed to wc w/supervison. wc propulsion x 56ft mod I including turns.  Gait 231ft w/RW and supervison including 2 turns. Car transfer performed w/supervision and verbal cues for safety using RW. Ascends and descends ramp w/RW w/supervison Gait 31ft on uneven surface/mulch including turning w/RW w/supervision. Pt able to pick clipboard up off of floor using RW for balance and supervision. Stairs - ascended/descended 12 steps w/2rails w/supervision. Pt required seated rest between efforts due to fatigue. MD requested PT session include low back therex due to c/o increasing LBP. Pt instructed w/and performed seated flexion stretch, alternating knees to chest, lower trunk rotation, prone press ups. Pt provided w/written HEP, reviewed w/pt.  Pt also reinstructed w/rolling to side and side to sit vs attempting to sit straight up.  Educated about body mechanics and decreased stress on back w/this sequencing.  Pt left supine w/rails up x 3, alarm set, bed in lowest position, and needs in reach.    second SESSION PAIN;   2/10 low back, treatment to tolerance Pt initially supine. Supine to sit w/cues for protection of low back via logrolling/side to sit.  Bed to wc w/supervision. Transported to pt gym for time efficiency. STS from wc w/supevison, gait 449ft w/RW and supervison including 3 turns and 2 min standing w/RW to get and drink cup of water.  Repeated STS from wc 2x10 w/cga performed for LE strengthening. Balance activities including sidestepping, retrogait w/RW and cga, cues for safety, attention to environment.  Standing w/RW marching x 10, heel raises x 12, repeated two sets of each w/rest between sets. Pt stated he needed to use BR.  Transported to room due to urgency.  Gait 68ft w/RW wc to commode, turn/sit w/supervision.  sts from commode w/supervision.  Gait 70ft to sink w/RW including threshold w/Supervison, stood at sink to wash and dry hands w/supervision and cues for walker placement.  Turn/sit to wc w/superivsion, cues.   Pt left out of bed in wc w/chair alarm set and needs in reach.  Pt reminded that he was to call his friend to confirm dc plans.  Pt stated that he understood.  Therapy/Group: Individual Therapy  Callie Fielding, [place] 04/19/2019, 12:28 PM

## 2019-04-19 NOTE — Progress Notes (Signed)
Occupational Therapy Session Note  Patient Details  Name: [name] MRN: [id number] Date of Birth: 03-Aug-1965  Today's Date: 04/19/2019 OT Individual Time: 1420-1505 OT Individual Time Calculation (min): 45 min   Short Term Goals: Week 1:  OT Short Term Goal 1 (Week 1): STG = LTG  Skilled Therapeutic Interventions/Progress Updates:    Pt greeted sitting in wc on the phone trying to talk to an attorney. Pt agreeable to get off the phone and participate in OT. Pt reported that he was worried his brother was trying to take his inheritance. Pt paranoid that his brother was trying to poison him to get his inheritance.  OT redirected pt to BADL tasks at hand. Pt ambulated into bathroom with RW and supervision. OT issued LH sponge to increase acess to LB and back. Bathing completed without OT assistance. Dressing completed sitting in wc with pt able to thread pant legs and stand to pull them up. OT provided verbal cues for pt to place LEs into figure 4 position, then he was able to thread socks. Pt reported he was trying to find someone to stay with at discharge. OT left pt sitting in wc at the window with phone to work on [employer]. Chair alarm on and call bell in reach.   Therapy Documentation Precautions:  Precautions Precautions: Fall Precaution Comments: mm tremors with mobility Restrictions Weight Bearing Restrictions: No Other Position/Activity Restrictions: chair with gait activity for safety Pain: Pt reports low back pain, but did not give number. Repositioned for comfort.  Therapy/Group: Individual Therapy  Valma Cava 04/19/2019, 2:28 PM

## 2019-04-20 NOTE — Progress Notes (Signed)
Social Work Patient ID: [name], male   DOB: Oct 29, 1965, 53 y.o.   MRN: [id number]  Have spoken with pt about his d/c plans as he has been unable to secure a d/c location + 24/7 supervision.  He continues to stay on the phone and call various people.  I have explained that I must begin search for [facility] bed and that he is welcome to continue to try to secure an alternate option but, when bed offer is received, he must d/c.  He is in agreement with this plan.  Vansh Reckart, LCSW

## 2019-04-20 NOTE — Progress Notes (Signed)
[facility] PHYSICAL MEDICINE & REHABILITATION PROGRESS NOTE   Subjective/Complaints:  Pt up in bed. Has a list of friends he's calling to see if he can find somewhere to stay. Still with low back pain  ROS: Patient denies fever, rash, sore throat, blurred vision, nausea, vomiting, diarrhea, cough, shortness of breath or chest pain,   headache, or mood change.      Objective:   No results found. No results for input(s): WBC, HGB, HCT, PLT in the last 72 hours. Recent Labs    04/18/19 0527 04/19/19 0527  NA 139 138  K 3.9 3.7  CL 104 105  CO2 25 23  GLUCOSE 102* 100*  BUN 12 12  CREATININE 1.07 1.22  CALCIUM 9.1 9.3    Intake/Output Summary (Last 24 hours) at 04/20/2019 1026 Last data filed at 04/19/2019 1900 Gross per 24 hour  Intake 240 ml  Output -  Net 240 ml     Physical Exam: Vital Signs Blood pressure (!) 111/55, pulse 64, temperature 98 F (36.7 C), resp. rate 19, height 6' (1.829 m), weight (!) 147.4 kg, SpO2 99 %. Constitutional: No distress . Vital signs reviewed. HEENT: EOMI, oral membranes moist Neck: supple Cardiovascular: RRR without murmur. No JVD    Respiratory: CTA Bilaterally without wheezes or rales. Normal effort    GI: BS +, non-tender, non-distended  Musculoskeletal:     Comments: LB TTP. Tr LE edema Left shoulder abduction limited to around 70 deg Neurological: improved insight, tangential and distracted easily.  Speech clear. (+) Asterixis. Motor 4/5 UE, 3+ prox to 4/5 distally in LE's. No focal sensory findings.  Skin: Skin is warm and dry. He is not diaphoretic. tattoo left calf Venous stasis changes in LEs above ankle B/L. A few areas of eschar on dorsum of right foot, toe nail injury/old blood    Psychiatric:  A little anxious    Assessment/Plan: 1. Functional deficits secondary to hepatic encephalopathy which require 3+ hours per day of interdisciplinary therapy in a comprehensive inpatient rehab setting.  Physiatrist is providing  close team supervision and 24 hour management of active medical problems listed below.  Physiatrist and rehab team continue to assess barriers to discharge/monitor patient progress toward functional and medical goals  Care Tool:  Bathing    Body parts bathed by patient: Right arm, Left arm, Chest, Abdomen, Buttocks, Front perineal area, Right upper leg, Left upper leg, Face, Right lower leg, Left lower leg   Body parts bathed by helper: Right lower leg, Left lower leg     Bathing assist Assist Level: Independent with assistive device Assistive Device Comment: LH sponge   Upper Body Dressing/Undressing Upper body dressing   What is the patient wearing?: Pull over shirt    Upper body assist Assist Level: Independent Assistive Device Comment: no clothing available  Lower Body Dressing/Undressing Lower body dressing      What is the patient wearing?: Pants     Lower body assist Assist for lower body dressing: Independent     Toileting Toileting    Toileting assist Assist for toileting: Supervision/Verbal cueing     Transfers Chair/bed transfer  Transfers assist     Chair/bed transfer assist level: Supervision/Verbal cueing     Locomotion Ambulation   Ambulation assist      Assist level: Supervision/Verbal cueing Assistive device: Walker-rolling Max distance: 200   Walk 10 feet activity   Assist     Assist level: Supervision/Verbal cueing Assistive device: Walker-rolling   Walk 50  feet activity   Assist    Assist level: Supervision/Verbal cueing Assistive device: Walker-rolling    Walk 150 feet activity   Assist    Assist level: Supervision/Verbal cueing Assistive device: Walker-rolling    Walk 10 feet on uneven surface  activity   Assist Walk 10 feet on uneven surfaces activity did not occur: Safety/medical concerns         Wheelchair     Assist Will patient use wheelchair at discharge?: No             Wheelchair  50 feet with 2 turns activity    Assist            Wheelchair 150 feet activity     Assist          Blood pressure (!) 111/55, pulse 64, temperature 98 F (36.7 C), resp. rate 19, height 6' (1.829 m), weight (!) 147.4 kg, SpO2 99 %. Medical Problem List and Plan: 1.  Functional deficits/balance deficits  secondary to hepatic encephalopathy  --Continue CIR therapies including PT, OT, and SLP   -dispo options dwindling. May need placement, FL2 sent 2.  Antithrombotics: -DVT/anticoagulation:  Pharmaceutical: Continue Lovenox 80 mg   -platelets holding 101k 11/2  -antiplatelet therapy: N/A 3. Pain Management:   ice, heat, topical modalities as needed 4. Mood: LCSW to follow for evaluation and support.              -antipsychotic agents: N/A 5. Neuropsych: This patient appears to be currently capable of making decisions on his own behalf.  -appreciate neuropsych eval  -continue ritalin trial for concentration 6. Skin/Wound Care: Routine pressure relief measures.  7. Fluids/Electrolytes/Nutrition: Monitor I/O.      8. HIV with progressive neutropenia: On Descovy and Pifeltro.  9. Cirrhosis of liver:    -lactulose   30mg  qid  -ammonia level down to 56 11/5  -platelets remain stable  -LFT's normal 11/5 10. Ascites: Monitor daily weights. On Midamor and Lasix resumed 10/27   Filed Weights   04/18/19 0435 04/19/19 0408 04/20/19 0637  Weight: (!) 148.3 kg (!) 146.7 kg (!) 147.4 kg    -lasix 80mg  daily currently  -11/3 weights stable  -11/4 weight decreased from 150.3kg to 147kg.  11. Hypokalemia: has history of medication non compliance per records and per K+ was 2.6 on admission  -potassium 3.7 11/5---continue same supplement 12. Acute on chronic insomnia: He has used elavil 100 mg in the past--+/- effective.   -continue scheduled trazodone (100mg ) and titrate up as needed.   -sleeping better 13. Medical Noncompliance- has hx of not taking medications correctly.    -attention is directly related to this 14. Morbid Obesity- BMI 46- weight 155 kg-   bariatric bed and use bariatric equipment for pt-  15. 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 Venous stasis- changes on legs- will monitor for insufficiency ulcers.  17. GERD-  Protonix 40 mg daily 18. Nicotine dependence- continue Nicotine patch 7 mg daily 19. Dry mouth: biotene.  20. Diet: working on better dietary choices per our discussions    LOS: 8 days A FACE TO [place] 04/20/2019, 10:26 AM

## 2019-04-20 NOTE — Plan of Care (Deleted)
  Problem: RH Balance Goal: LTG Patient will maintain dynamic standing with ADLs (OT) Description: LTG:  Patient will maintain dynamic standing balance with assist during activities of daily living (OT)  Outcome: Not Met (add Reason) Note: Supervision for dynamic standing   Problem: RH Dressing Goal: LTG Patient will perform lower body dressing w/assist (OT) Description: LTG: Patient will perform lower body dressing with assist, with/without cues in positioning using equipment (OT) Outcome: Not Met (add Reason) Note: Supervision for LB dressing   Problem: RH Toilet Transfers Goal: LTG Patient will perform toilet transfers w/assist (OT) Description: LTG: Patient will perform toilet transfers with assist, with/without cues using equipment (OT) Outcome: Not Met (add Reason) Note: Supervision for toilet transfers   Problem: RH Balance Goal: LTG: Patient will maintain dynamic sitting balance (OT) Description: LTG:  Patient will maintain dynamic sitting balance with assistance during activities of daily living (OT) Outcome: Completed/Met   Problem: RH Eating Goal: LTG Patient will perform eating w/assist, cues/equip (OT) Description: LTG: Patient will perform eating with assist, with/without cues using equipment (OT) Outcome: Completed/Met   Problem: RH Grooming Goal: LTG Patient will perform grooming w/assist,cues/equip (OT) Description: LTG: Patient will perform grooming with assist, with/without cues using equipment (OT) Outcome: Completed/Met   Problem: RH Bathing Goal: LTG Patient will bathe all body parts with assist levels (OT) Description: LTG: Patient will bathe all body parts with assist levels (OT) Outcome: Completed/Met   Problem: RH Dressing Goal: LTG Patient will perform upper body dressing (OT) Description: LTG Patient will perform upper body dressing with assist, with/without cues (OT). Outcome: Completed/Met   Problem: RH Toileting Goal: LTG Patient will perform  toileting task (3/3 steps) with assistance level (OT) Description: LTG: Patient will perform toileting task (3/3 steps) with assistance level (OT)  Outcome: Completed/Met   Problem: RH Simple Meal Prep Goal: LTG Patient will perform simple meal prep w/assist (OT) Description: LTG: Patient will perform simple meal prep with assistance, with/without cues (OT). Outcome: Completed/Met   Problem: RH Laundry Goal: LTG Patient will perform laundry w/assist, cues (OT) Description: LTG: Patient will perform laundry with assistance, with/without cues (OT). Outcome: Completed/Met   Problem: RH Light Housekeeping Goal: LTG Patient will perform light housekeeping w/assist (OT) Description: LTG: Patient will perform light housekeeping with assistance, with/without cues (OT). Outcome: Completed/Met   Problem: RH Tub/Shower Transfers Goal: LTG Patient will perform tub/shower transfers w/assist (OT) Description: LTG: Patient will perform tub/shower transfers with assist, with/without cues using equipment (OT) Outcome: Completed/Met   Problem: RH Memory Goal: LTG Patient will demonstrate ability for day to day recall/carry over during activities of daily living with assistance level (OT) Description: LTG:  Patient will demonstrate ability for day to day recall/carry over during activities of daily living with assistance level (OT). Outcome: Completed/Met

## 2019-04-20 NOTE — Progress Notes (Signed)
Speech Language Pathology Discharge Summary  Patient Details  Name: [name] MRN: [id number] Date of Birth: 1965/12/17   Patient has met 3 of 4 long term goals.  Patient to discharge at [facility] level.   Reasons goals not met: Patient continues to require Min-Mod A verbal cues for recall with use of strategies   Clinical Impression/Discharge Summary: Patient has made functional but inconsistent gains and has met 3 of 4 LTGs this admission. Currently, patient requires overall Min A verbal cues to complete functional and familiar tasks safely in regards to problem solving, attention, and awareness. Overall Mod A multimodal cues are needed for recall with use of strategies. Patient education is complete and patient will discharge with recommendations for 24 hour supervision for safety. Patient would benefit from f/u SLP services to maximize his cognitive functioning and overall functional independence prior to discharge.   Care Partner:  Caregiver Able to Provide Assistance: Yes  Type of Caregiver Assistance: Cognitive  Recommendation:  Home Health SLP;24 hour supervision/assistance  Rationale for SLP Follow Up: Maximize cognitive function and independence;Reduce caregiver burden   Equipment: N/A   Reasons for discharge: Discharged from hospital;Treatment goals met   Patient/Family Agrees with Progress Made and Goals Achieved: Yes    [name] 04/20/2019, 6:41 AM
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
## 2019-04-20 NOTE — Progress Notes (Signed)
Occupational Therapy Discharge Summary  Patient Details  Name: [name] MRN: [id number] Date of Birth: 17-May-1966  Patient has met 12 of 15 long term goals due to improved activity tolerance, improved balance, postural control, ability to compensate for deficits, improved attention and improved awareness.  Patient to discharge at overall Supervision level.  Patient's care partner unavailable to provide the necessary assistance at discharge. Recommend dc to rest home.   Reasons goals not met: Pt continues to need supervision for functional transfers 2/2 poor safety awareness.  Recommendation:  Patient will benefit from ongoing skilled OT services in skilled nursing facility setting to continue to advance functional skills in the area of BADL.  Equipment: bariatric BSC  Reasons for discharge: treatment goals met and discharge from hospital  Patient/family agrees with progress made and goals achieved: Yes  OT Discharge Precautions/Restrictions  Precautions Precautions: Fall Restrictions Weight Bearing Restrictions: No Pain  denies pain ADL ADL Equipment Provided: Long-handled sponge Eating: Independent Where Assessed-Eating: Chair Grooming: Independent Where Assessed-Grooming: Standing at sink Upper Body Bathing: Independent Where Assessed-Upper Body Bathing: Sitting at sink Lower Body Bathing: Modified independent Where Assessed-Lower Body Bathing: Sitting at sink Upper Body Dressing: Independent Where Assessed-Upper Body Dressing: Wheelchair Lower Body Dressing: Supervision/safety Where Assessed-Lower Body Dressing: Wheelchair Toileting: Supervision/safety Where Assessed-Toileting: Glass blower/designer: Distant supervision Armed forces technical officer Method: Counselling psychologist: Energy manager: Close supervision ADL Comments: UB dressing with gown only as no other clothing available, cues for sequencing and safety, mild tremor L >  R Vision Eye Alignment: Within Functional Limits Perception  Perception: Within Functional Limits Praxis Praxis: Intact Cognition Overall Cognitive Status: Impaired/Different from baseline Arousal/Alertness: Awake/alert Orientation Level: Oriented X4 Self Monitoring: Impaired Behaviors: Impulsive;Perseveration Comments: tangental speech Sensation Sensation Light Touch: Appears Intact Proprioception: Appears Intact Coordination Gross Motor Movements are Fluid and Coordinated: No Fine Motor Movements are Fluid and Coordinated: No Coordination and Movement Description: tremors improved, mild coordination deficits continue Motor  Motor Motor - Discharge Observations: generalized weakness/deconditioning Mobility  Bed Mobility Bed Mobility: Rolling Right;Rolling Left;Right Sidelying to Sit;Sit to Supine Rolling Right: Supervision/verbal cueing Rolling Left: Supervision/Verbal cueing Right Sidelying to Sit: Supervision/Verbal cueing Left Sidelying to Sit: Supervision/Verbal cueing Sitting - Scoot to Edge of Bed: Supervision/Verbal cueing Sit to Supine: Supervision/Verbal cueing Sit to Sidelying Left: Supervision/Verbal cueing Transfers Sit to Stand: Supervision/Verbal cueing Stand to Sit: Supervision/Verbal cueing  Balance Berg Balance Test Standing on One Leg: Tries to lift leg/unable to hold 3 seconds but remains standing independently Static Sitting Balance Static Sitting - Balance Support: No upper extremity supported Static Sitting - Level of Assistance: 7: Independent Dynamic Sitting Balance Dynamic Sitting - Balance Support: No upper extremity supported Dynamic Sitting - Level of Assistance: 7: Independent Static Standing Balance Static Standing - Level of Assistance: 6: Modified independent (Device/Increase time) Dynamic Standing Balance Dynamic Standing - Level of Assistance: 5: Stand by assistance Extremity/Trunk Assessment RUE Assessment RUE Assessment: Within  Functional Limits General Strength Comments: 4+/5 LUE Assessment LUE Assessment: Within Functional Limits Passive Range of Motion (PROM) Comments: shoulder flex and abd to 90 after mobilization- baseline shoulder issues Active Range of Motion (AROM) Comments: able to ER to 45, flex and abd shouder to 70-80, distal [facility] General Strength Comments: elbow - hand 4+/5, shoulder painful AROM   [name] Edie Vallandingham 04/24/2019, 1:32 PM

## 2019-04-21 ENCOUNTER — Inpatient Hospital Stay (HOSPITAL_COMMUNITY): Payer: Medicaid Other | Admitting: Physical Therapy

## 2019-04-21 NOTE — Progress Notes (Signed)
Physical Therapy Session Note  Patient Details  Name: [name] MRN: [id number] Date of Birth: 05-20-1966  Today's Date: 04/21/2019 PT Individual Time: 1015-1057 PT Individual Time Calculation (min): 42 min   Short Term Goals: Week 1:  PT Short Term Goal 1 (Week 1): =LTGS  Skilled Therapeutic Interventions/Progress Updates:  Pt performed all transfers with S. Pt ambulated 200 feet x 2 with rolling walker and S. Pt performed alternating cone taps and criss cross cone taps 3 sets x 10 reps each. Pt returned to room and left sitting up on edge of bed with bed alarm on and call bell within reach.   Therapy Documentation Precautions:  Precautions Precautions: Fall Precaution Comments: mm tremors with mobility Restrictions Weight Bearing Restrictions: No Other Position/Activity Restrictions: chair with gait activity for safety General:   Pain: Pt c/o mild back tightness.    Therapy/Group: Individual Therapy  [name] 04/21/2019, 12:17 PM

## 2019-04-21 NOTE — Progress Notes (Signed)
[place] PHYSICAL MEDICINE & REHABILITATION PROGRESS NOTE   Subjective/Complaints:  Still looking for a place to stay post discharge. No back pain complaints today  ROS: Patient deniesnausea, vomiting, diarrhea, cough, shortness of breath or chest pain,      Objective:   No results found. No results for input(s): WBC, HGB, HCT, PLT in the last 72 hours. Recent Labs    04/19/19 0527  NA 138  K 3.7  CL 105  CO2 23  GLUCOSE 100*  BUN 12  CREATININE 1.22  CALCIUM 9.3    Intake/Output Summary (Last 24 hours) at 04/21/2019 1042 Last data filed at 04/20/2019 2044 Gross per 24 hour  Intake 120 ml  Output -  Net 120 ml     Physical Exam: Vital Signs Blood pressure 138/77, pulse 66, temperature 98.3 F (36.8 C), resp. rate 20, height 6' (1.829 m), weight (!) 147.4 kg, SpO2 100 %. Constitutional: No distress . Vital signs reviewed. HEENT: EOMI, oral membranes moist Neck: supple Cardiovascular: RRR without murmur. No JVD    Respiratory: CTA Bilaterally without wheezes or rales. Normal effort    GI: BS +, non-tender, non-distended  Musculoskeletal:   No pain with upper or lower limb range of motion Left shoulder abduction limited to around 70 deg Neurological: improved insight, tangential and distracted easily.  Speech clear. (+) Asterixis. Motor 4/5 UE, 3+ prox to 4/5 distally in LE's. No focal sensory findings.  Skin: Skin is warm and dry. He is not diaphoretic. tattoo left calf Venous stasis changes in LEs above ankle B/L. Psychiatric:   anxious no lability or agitation   Assessment/Plan: 1. Functional deficits secondary to hepatic encephalopathy which require 3+ hours per day of interdisciplinary therapy in a comprehensive inpatient rehab setting.  Physiatrist is providing close team supervision and 24 hour management of active medical problems listed below.  Physiatrist and rehab team continue to assess barriers to discharge/monitor patient progress toward  functional and medical goals  Care Tool:  Bathing    Body parts bathed by patient: Right arm, Left arm, Chest, Abdomen, Buttocks, Front perineal area, Right upper leg, Left upper leg, Face, Right lower leg, Left lower leg   Body parts bathed by helper: Right lower leg, Left lower leg     Bathing assist Assist Level: Independent with assistive device Assistive Device Comment: LH sponge   Upper Body Dressing/Undressing Upper body dressing   What is the patient wearing?: Pull over shirt    Upper body assist Assist Level: Independent Assistive Device Comment: no clothing available  Lower Body Dressing/Undressing Lower body dressing      What is the patient wearing?: Pants     Lower body assist Assist for lower body dressing: Independent     Toileting Toileting    Toileting assist Assist for toileting: Supervision/Verbal cueing     Transfers Chair/bed transfer  Transfers assist     Chair/bed transfer assist level: Supervision/Verbal cueing     Locomotion Ambulation   Ambulation assist      Assist level: Supervision/Verbal cueing Assistive device: Walker-rolling Max distance: 200   Walk 10 feet activity   Assist     Assist level: Supervision/Verbal cueing Assistive device: Walker-rolling   Walk 50 feet activity   Assist    Assist level: Supervision/Verbal cueing Assistive device: Walker-rolling    Walk 150 feet activity   Assist    Assist level: Supervision/Verbal cueing Assistive device: Walker-rolling    Walk 10 feet on uneven surface  activity  Assist Walk 10 feet on uneven surfaces activity did not occur: Safety/medical concerns         Wheelchair     Assist Will patient use wheelchair at discharge?: No             Wheelchair 50 feet with 2 turns activity    Assist            Wheelchair 150 feet activity     Assist          Blood pressure 138/77, pulse 66, temperature 98.3 F (36.8 C),  resp. rate 20, height 6' (1.829 m), weight (!) 147.4 kg, SpO2 100 %. Medical Problem List and Plan: 1.  Functional deficits/balance deficits  secondary to hepatic encephalopathy  --Continue CIR therapies including PT, OT, and SLP   -dispo options dwindling. May need placement, FL2 sent, per social worker note, rest home is his option 2.  Antithrombotics: -DVT/anticoagulation:  Pharmaceutical: Continue Lovenox 80 mg   -platelets holding 101k 11/2  -antiplatelet therapy: N/A 3. Pain Management:   ice, heat, topical modalities as needed 4. Mood: LCSW to follow for evaluation and support.              -antipsychotic agents: N/A 5. Neuropsych: This patient appears to be currently capable of making decisions on his own behalf.  -appreciate neuropsych eval  -continue ritalin trial for concentration 6. Skin/Wound Care: Routine pressure relief measures.  7. Fluids/Electrolytes/Nutrition: Monitor I/O.      8. HIV with progressive neutropenia: On Descovy and Pifeltro.  9. Cirrhosis of liver:    -lactulose   30mg  qid  -ammonia level down to 56 11/5  -platelets remain stable  -LFT's normal 11/5 10. Ascites: Monitor daily weights. On Midamor and Lasix resumed 10/27   Filed Weights   04/19/19 0408 04/20/19 0637 04/21/19 [id number]  Weight: (!) 146.7 kg (!) 147.4 kg (!) 147.4 kg    -lasix 80mg  daily currently Weights are stable 11. Hypokalemia: has history of medication non compliance per records and per K+ was 2.6 on admission  -potassium 3.7 11/5---continue same supplement 12. Acute on chronic insomnia: He has used elavil 100 mg in the past--+/- effective.   -continue scheduled trazodone (100mg ) and titrate up as needed.   -sleeping better 13. Medical Noncompliance- has hx of not taking medications correctly.   -attention is directly related to this 14. Morbid Obesity- BMI 46- weight 155 kg-   bariatric bed and use bariatric equipment for pt-  15. Asterixis- due to hepatic encephalopathy; with  Korsakoff's psychosis/syndrome, esophageal varices, portal HTN  - will con't per #9 16. Venous stasis- changes on legs- will monitor for insufficiency ulcers.  17. GERD-  Protonix 40 mg daily 18. Nicotine dependence- continue Nicotine patch 7 mg daily 19. Dry mouth: biotene.  20. Diet: working on better dietary choices per our discussions    LOS: 9 days A FACE TO [place] 04/21/2019, 10:42 AM

## 2019-04-21 NOTE — Plan of Care (Signed)
  Problem: Consults Goal: RH GENERAL PATIENT EDUCATION Description: See Patient Education module for education specifics. Outcome: Progressing Goal: Skin Care Protocol Initiated - if Braden Score 18 or less Description: If consults are not indicated, leave blank or document N/A Outcome: Progressing   Problem: RH BOWEL ELIMINATION Goal: RH STG MANAGE BOWEL WITH ASSISTANCE Description: STG Manage Bowel with Moderate Assistance. Outcome: Progressing   Problem: RH SKIN INTEGRITY Goal: RH STG SKIN FREE OF INFECTION/BREAKDOWN Outcome: Progressing Goal: RH STG MAINTAIN SKIN INTEGRITY WITH ASSISTANCE Description: STG Maintain Skin Integrity With minimal Assistance. Outcome: Progressing   Problem: RH SAFETY Goal: RH STG ADHERE TO SAFETY PRECAUTIONS W/ASSISTANCE/DEVICE Description: STG Adhere to Safety Precautions With minimal Assistance/Device. Outcome: Progressing   Problem: RH PAIN MANAGEMENT Goal: RH STG PAIN MANAGED AT OR BELOW PT'S PAIN GOAL Description: Pain < 3/10 with pain medication  Outcome: Progressing   Problem: RH KNOWLEDGE DEFICIT GENERAL Goal: RH STG INCREASE KNOWLEDGE OF SELF CARE AFTER HOSPITALIZATION Outcome: Progressing

## 2019-04-22 NOTE — Plan of Care (Signed)
  Problem: Consults Goal: RH GENERAL PATIENT EDUCATION Description: See Patient Education module for education specifics. Outcome: Progressing Goal: Skin Care Protocol Initiated - if Braden Score 18 or less Description: If consults are not indicated, leave blank or document N/A Outcome: Progressing   Problem: RH BOWEL ELIMINATION Goal: RH STG MANAGE BOWEL WITH ASSISTANCE Description: STG Manage Bowel with Moderate Assistance. Outcome: Progressing   Problem: RH SKIN INTEGRITY Goal: RH STG SKIN FREE OF INFECTION/BREAKDOWN Outcome: Progressing Goal: RH STG MAINTAIN SKIN INTEGRITY WITH ASSISTANCE Description: STG Maintain Skin Integrity With minimal Assistance. Outcome: Progressing   Problem: RH SAFETY Goal: RH STG ADHERE TO SAFETY PRECAUTIONS W/ASSISTANCE/DEVICE Description: STG Adhere to Safety Precautions With minimal Assistance/Device. Outcome: Progressing   Problem: RH PAIN MANAGEMENT Goal: RH STG PAIN MANAGED AT OR BELOW PT'S PAIN GOAL Description: Pain < 3/10 with pain medication  Outcome: Progressing   Problem: RH KNOWLEDGE DEFICIT GENERAL Goal: RH STG INCREASE KNOWLEDGE OF SELF CARE AFTER HOSPITALIZATION Outcome: Progressing

## 2019-04-22 NOTE — Progress Notes (Addendum)
[place] PHYSICAL MEDICINE & REHABILITATION PROGRESS NOTE   Subjective/Complaints:  Still looking for a place to stay post discharge. Patient thinks a family member might be able to get him today.  We discussed that he would need 24/7 supervision not just a ride. Patient appears distractible.  He jumps from topic to topic.  He cannot recall whether he is received any discharge papers.  He looks through a notebook and showed some home exercise programs.  ROS: Patient deniesnausea, vomiting, diarrhea, cough, shortness of breath or chest pain,      Objective:   No results found. No results for input(s): WBC, HGB, HCT, PLT in the last 72 hours. No results for input(s): NA, K, CL, CO2, GLUCOSE, BUN, CREATININE, CALCIUM in the last 72 hours.  Intake/Output Summary (Last 24 hours) at 04/22/2019 1130 Last data filed at 04/21/2019 1447 Gross per 24 hour  Intake 240 ml  Output -  Net 240 ml     Physical Exam: Vital Signs Blood pressure 137/83, pulse 70, temperature 98.7 F (37.1 C), resp. rate 20, height 6' (1.829 m), weight (!) 147.4 kg, SpO2 97 %. Constitutional: No distress . Vital signs reviewed. HEENT: EOMI, oral membranes moist Neck: supple Cardiovascular: RRR without murmur. No JVD    Respiratory: CTA Bilaterally without wheezes or rales. Normal effort    GI: BS +, non-tender, non-distended  Musculoskeletal:   No pain with upper or lower limb range of motion Left shoulder abduction limited to around 70 deg Neurological: improved insight, tangential and distracted easily.  Speech clear. (+) Asterixis. Motor 4/5 UE, 3+ prox to 4/5 distally in LE's. No focal sensory findings.  Skin: Skin is warm and dry. He is not diaphoretic. tattoo left calf Venous stasis changes in LEs above ankle B/L. Psychiatric:   anxious no lability or agitation   Assessment/Plan: 1. Functional deficits secondary to hepatic encephalopathy which require 3+ hours per day of interdisciplinary therapy in a  comprehensive inpatient rehab setting.  Physiatrist is providing close team supervision and 24 hour management of active medical problems listed below.  Physiatrist and rehab team continue to assess barriers to discharge/monitor patient progress toward functional and medical goals  Care Tool:  Bathing    Body parts bathed by patient: Right arm, Left arm, Chest, Abdomen, Buttocks, Front perineal area, Right upper leg, Left upper leg, Face, Right lower leg, Left lower leg   Body parts bathed by helper: Right lower leg, Left lower leg     Bathing assist Assist Level: Independent with assistive device Assistive Device Comment: LH sponge   Upper Body Dressing/Undressing Upper body dressing   What is the patient wearing?: Pull over shirt    Upper body assist Assist Level: Independent Assistive Device Comment: no clothing available  Lower Body Dressing/Undressing Lower body dressing      What is the patient wearing?: Pants     Lower body assist Assist for lower body dressing: Independent     Toileting Toileting    Toileting assist Assist for toileting: Supervision/Verbal cueing     Transfers Chair/bed transfer  Transfers assist     Chair/bed transfer assist level: Supervision/Verbal cueing     Locomotion Ambulation   Ambulation assist      Assist level: Supervision/Verbal cueing Assistive device: Walker-rolling Max distance: 200   Walk 10 feet activity   Assist     Assist level: Supervision/Verbal cueing Assistive device: Walker-rolling   Walk 50 feet activity   Assist    Assist level: Supervision/Verbal  cueing Assistive device: Walker-rolling    Walk 150 feet activity   Assist    Assist level: Supervision/Verbal cueing Assistive device: Walker-rolling    Walk 10 feet on uneven surface  activity   Assist Walk 10 feet on uneven surfaces activity did not occur: Safety/medical concerns         Wheelchair     Assist Will  patient use wheelchair at discharge?: No             Wheelchair 50 feet with 2 turns activity    Assist            Wheelchair 150 feet activity     Assist          Blood pressure 137/83, pulse 70, temperature 98.7 F (37.1 C), resp. rate 20, height 6' (1.829 m), weight (!) 147.4 kg, SpO2 97 %. Medical Problem List and Plan: 1.  Functional deficits/balance deficits  secondary to hepatic encephalopathy  --Continue CIR therapies including PT, OT, and SLP   -dispo options dwindling. May need placement, FL2 sent, per social worker note, rest home is his option, requires 24/7 supervision not just a ride to someone's house. 2.  Antithrombotics: -DVT/anticoagulation:  Pharmaceutical: Continue Lovenox 80 mg   -platelets holding 101k 11/2  -antiplatelet therapy: N/A 3. Pain Management:   ice, heat, topical modalities as needed 4. Mood: LCSW to follow for evaluation and support.              -antipsychotic agents: N/A 5. Neuropsych: This patient appears to be currently capable of making decisions on his own behalf.  -appreciate neuropsych eval  -continue ritalin trial for concentration 6. Skin/Wound Care: Routine pressure relief measures.  7. Fluids/Electrolytes/Nutrition: Monitor I/O.      8. HIV with progressive neutropenia: On Descovy and Pifeltro.  9. Cirrhosis of liver:    -lactulose   30mg  qid  -ammonia level down to 56 11/5  -platelets remain stable  -LFT's normal 11/5 10. Ascites: Monitor daily weights. On Midamor and Lasix resumed 10/27   Filed Weights   04/20/19 [id number] 04/21/19 0608 04/22/19 0500  Weight: (!) 147.4 kg (!) 147.4 kg (!) 147.4 kg    -lasix 80mg  daily currently Weights are stable 11. Hypokalemia: has history of medication non compliance per records and per K+ was 2.6 on admission  -potassium 3.7 11/5---continue same supplement 12. Acute on chronic insomnia: He has used elavil 100 mg in the past--+/- effective.   -continue scheduled trazodone  (100mg ) and titrate up as needed.   -sleeping better 13. Medical Noncompliance- has hx of not taking medications correctly.   -attention is directly related to this 14. Morbid Obesity- BMI 46- weight 155 kg-   bariatric bed and use bariatric equipment for pt-  15. 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 Venous stasis- changes on legs- will monitor for insufficiency ulcers.  17. GERD-  Protonix 40 mg daily 18. Nicotine dependence- continue Nicotine patch 7 mg daily 19. Dry mouth: biotene.  20. Diet: working on better dietary choices per our discussions    LOS: 10 days A FACE TO [place] 04/22/2019, 11:30 AM

## 2019-04-23 ENCOUNTER — Telehealth: Payer: Self-pay

## 2019-04-23 LAB — BASIC METABOLIC PANEL
Anion gap: 9 (ref 5–15)
BUN: 13 mg/dL (ref 6–20)
CO2: 23 mmol/L (ref 22–32)
Calcium: 9.1 mg/dL (ref 8.9–10.3)
Chloride: 106 mmol/L (ref 98–111)
Creatinine, Ser: 1.23 mg/dL (ref 0.61–1.24)
GFR calc Af Amer: >60 mL/min (ref >60)
GFR calc non Af Amer: >60 mL/min (ref >60)
Glucose, Bld: 100 mg/dL — ABNORMAL HIGH (ref 70–99)
Potassium: 3.9 mmol/L (ref 3.5–5.1)
Sodium: 138 mmol/L (ref 135–145)

## 2019-04-23 LAB — CBC
HCT: 40.1 % (ref 39.0–52.0)
Hemoglobin: 13.1 g/dL (ref 13.0–17.0)
MCH: 28 pg (ref 26.0–34.0)
MCHC: 32.7 g/dL (ref 30.0–36.0)
MCV: 85.7 fL (ref 80.0–100.0)
Platelets: 85 10*3/uL — ABNORMAL LOW (ref 150–400)
RBC: 4.68 MIL/uL (ref 4.22–5.81)
RDW: 16.4 % — ABNORMAL HIGH (ref 11.5–15.5)
WBC: 3.6 10*3/uL — ABNORMAL LOW (ref 4.0–10.5)
nRBC: 0 % (ref 0.0–0.2)

## 2019-04-23 NOTE — Plan of Care (Signed)
  Problem: Consults Goal: RH GENERAL PATIENT EDUCATION Description: See Patient Education module for education specifics. Outcome: Progressing Goal: Skin Care Protocol Initiated - if Braden Score 18 or less Description: If consults are not indicated, leave blank or document N/A Outcome: Progressing   Problem: RH BOWEL ELIMINATION Goal: RH STG MANAGE BOWEL WITH ASSISTANCE Description: STG Manage Bowel with Moderate Assistance. Outcome: Progressing   Problem: RH SKIN INTEGRITY Goal: RH STG SKIN FREE OF INFECTION/BREAKDOWN Outcome: Progressing Goal: RH STG MAINTAIN SKIN INTEGRITY WITH ASSISTANCE Description: STG Maintain Skin Integrity With minimal Assistance. Outcome: Progressing   Problem: RH SAFETY Goal: RH STG ADHERE TO SAFETY PRECAUTIONS W/ASSISTANCE/DEVICE Description: STG Adhere to Safety Precautions With minimal Assistance/Device. Outcome: Progressing   Problem: RH PAIN MANAGEMENT Goal: RH STG PAIN MANAGED AT OR BELOW PT'S PAIN GOAL Description: Pain < 3/10 with pain medication  Outcome: Progressing   Problem: RH KNOWLEDGE DEFICIT GENERAL Goal: RH STG INCREASE KNOWLEDGE OF SELF CARE AFTER HOSPITALIZATION Outcome: Progressing

## 2019-04-23 NOTE — Progress Notes (Signed)
Social Work Patient ID: [name], male   DOB: [birth year], 53 y.o.   MRN: [id number]   Continue to seek RH bed and have sent out to all surrounding areas without any offers as of yet.  Will keep team posted.  Rema Lievanos, LCSW
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
## 2019-04-23 NOTE — Progress Notes (Signed)
[place] PHYSICAL MEDICINE & REHABILITATION PROGRESS NOTE   Subjective/Complaints:  Still looking at living options. Aware that SW is working on [facility] placement. Back feeling better  ROS: Patient denies fever, rash, sore throat, blurred vision, nausea, vomiting, diarrhea, cough, shortness of breath or chest pain,   headache, or mood change.   Objective:   No results found. Recent Labs    04/23/19 0631  WBC 3.6*  HGB 13.1  HCT 40.1  PLT 85*   Recent Labs    04/23/19 0631  NA 138  K 3.9  CL 106  CO2 23  GLUCOSE 100*  BUN 13  CREATININE 1.23  CALCIUM 9.1    Intake/Output Summary (Last 24 hours) at 04/23/2019 0950 Last data filed at 04/23/2019 0740 Gross per 24 hour  Intake 684 ml  Output -  Net 684 ml     Physical Exam: Vital Signs Blood pressure 119/71, pulse 67, temperature 98.2 F (36.8 C), resp. rate 16, height 6' (1.829 m), weight (!) 147.4 kg, SpO2 97 %. Constitutional: No distress . Vital signs reviewed. HEENT: EOMI, oral membranes moist Neck: supple Cardiovascular: RRR without murmur. No JVD    Respiratory: CTA Bilaterally without wheezes or rales. Normal effort    GI: BS +, non-tender, non-distended  Musculoskeletal: LBP less  No pain with upper or lower limb range of motion Left shoulder abduction limited to around 70 deg Neurological: still tangential  Speech clear. (+) Asterixis. Motor 4/5 UE, 3+ prox to 4/5 distally in LE's. No focal sensory findings.  Skin: Skin is warm and dry. He is not diaphoretic. tattoo left calf Venous stasis changes in LEs above ankle B/L---no changes Psychiatric:   pleasant   Assessment/Plan: 1. Functional deficits secondary to hepatic encephalopathy which require 3+ hours per day of interdisciplinary therapy in a comprehensive inpatient rehab setting.  Physiatrist is providing close team supervision and 24 hour management of active medical problems listed below.  Physiatrist and rehab team continue to assess barriers  to discharge/monitor patient progress toward functional and medical goals  Care Tool:  Bathing    Body parts bathed by patient: Right arm, Left arm, Chest, Abdomen, Buttocks, Front perineal area, Right upper leg, Left upper leg, Face, Right lower leg, Left lower leg   Body parts bathed by helper: Right lower leg, Left lower leg     Bathing assist Assist Level: Independent with assistive device Assistive Device Comment: LH sponge   Upper Body Dressing/Undressing Upper body dressing   What is the patient wearing?: Pull over shirt    Upper body assist Assist Level: Independent Assistive Device Comment: no clothing available  Lower Body Dressing/Undressing Lower body dressing      What is the patient wearing?: Pants     Lower body assist Assist for lower body dressing: Independent     Toileting Toileting    Toileting assist Assist for toileting: Supervision/Verbal cueing     Transfers Chair/bed transfer  Transfers assist     Chair/bed transfer assist level: Supervision/Verbal cueing     Locomotion Ambulation   Ambulation assist      Assist level: Supervision/Verbal cueing Assistive device: Walker-rolling Max distance: 200   Walk 10 feet activity   Assist     Assist level: Supervision/Verbal cueing Assistive device: Walker-rolling   Walk 50 feet activity   Assist    Assist level: Supervision/Verbal cueing Assistive device: Walker-rolling    Walk 150 feet activity   Assist    Assist level: Supervision/Verbal cueing Assistive device:  Walker-rolling    Walk 10 feet on uneven surface  activity   Assist Walk 10 feet on uneven surfaces activity did not occur: Safety/medical concerns         Wheelchair     Assist Will patient use wheelchair at discharge?: No             Wheelchair 50 feet with 2 turns activity    Assist            Wheelchair 150 feet activity     Assist          Blood pressure 119/71,  pulse 67, temperature 98.2 F (36.8 C), resp. rate 16, height 6' (1.829 m), weight (!) 147.4 kg, SpO2 97 %. Medical Problem List and Plan: 1.  Functional deficits/balance deficits  secondary to hepatic encephalopathy  --Continue CIR therapies including PT, OT, and SLP   -looking at [facility] as dispo option 2.  Antithrombotics: -DVT/anticoagulation:  Pharmaceutical: Continue Lovenox 80 mg   -platelets holding 101k 11/2  -antiplatelet therapy: N/A 3. Pain Management:   ice, heat, topical modalities as needed 4. Mood: LCSW to follow for evaluation and support.              -antipsychotic agents: N/A 5. Neuropsych: This patient appears to be currently capable of making decisions on his own behalf.  -appreciate neuropsych eval  -continue ritalin trial for concentration 6. Skin/Wound Care: Routine pressure relief measures.  7. Fluids/Electrolytes/Nutrition: Monitor I/O.      8. HIV with progressive neutropenia: On Descovy and Pifeltro.  9. Cirrhosis of liver:    -lactulose   30mg  qid  -ammonia level down to 56 11/5  -platelets remain stable  -LFT's normal 11/5 10. Ascites: Monitor daily weights. On Midamor and Lasix resumed 10/27   Filed Weights   04/20/19 [id number] 04/21/19 0608 04/22/19 0500  Weight: (!) 147.4 kg (!) 147.4 kg (!) 147.4 kg    -lasix 80mg  daily currently Weights are stable 11/8 11. Hypokalemia: has history of medication non compliance per records and per K+ was 2.6 on admission  -potassium 3.9 11/9---continue same supplement 12. Acute on chronic insomnia: He has used elavil 100 mg in the past--+/- effective.   -continue scheduled trazodone (100mg ) and titrate up as needed.   -sleeping better 13. Medical Noncompliance- has hx of not taking medications correctly.   -attention is directly related to this 14. Morbid Obesity- BMI 46- weight 155 kg-   bariatric bed and use bariatric equipment for pt-  15. 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 Venous stasis- changes on legs- will monitor for insufficiency ulcers.  17. GERD-  Protonix 40 mg daily 18. Nicotine dependence- continue Nicotine patch 7 mg daily 19. Dry mouth: biotene.   20. Diet: better dietary choices    LOS: 11 days A FACE TO FACE EVALUATION WAS PERFORMED  Meredith Staggers 04/23/2019, 9:50 AM

## 2019-04-24 NOTE — Plan of Care (Signed)
  Problem: Consults Goal: RH GENERAL PATIENT EDUCATION Description: See Patient Education module for education specifics. Outcome: Progressing Goal: Skin Care Protocol Initiated - if Braden Score 18 or less Description: If consults are not indicated, leave blank or document N/A Outcome: Progressing   Problem: RH BOWEL ELIMINATION Goal: RH STG MANAGE BOWEL WITH ASSISTANCE Description: STG Manage Bowel with Moderate Assistance. Outcome: Progressing   Problem: RH SKIN INTEGRITY Goal: RH STG SKIN FREE OF INFECTION/BREAKDOWN Outcome: Progressing Goal: RH STG MAINTAIN SKIN INTEGRITY WITH ASSISTANCE Description: STG Maintain Skin Integrity With minimal Assistance. Outcome: Progressing   Problem: RH SAFETY Goal: RH STG ADHERE TO SAFETY PRECAUTIONS W/ASSISTANCE/DEVICE Description: STG Adhere to Safety Precautions With minimal Assistance/Device. Outcome: Progressing   Problem: RH PAIN MANAGEMENT Goal: RH STG PAIN MANAGED AT OR BELOW PT'S PAIN GOAL Description: Pain < 3/10 with pain medication  Outcome: Progressing   Problem: RH KNOWLEDGE DEFICIT GENERAL Goal: RH STG INCREASE KNOWLEDGE OF SELF CARE AFTER HOSPITALIZATION Outcome: Progressing
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
## 2019-04-24 NOTE — Progress Notes (Signed)
[place] PHYSICAL MEDICINE & REHABILITATION PROGRESS NOTE   Subjective/Complaints:  No new issues overnight. Nothing has worked out as living arrangements are concerned  ROS: Patient denies fever, rash, sore throat, blurred vision, nausea, vomiting, diarrhea, cough, shortness of breath or chest pain, joint or back pain, headache, or mood change.   Objective:   No results found. Recent Labs    04/23/19 0631  WBC 3.6*  HGB 13.1  HCT 40.1  PLT 85*   Recent Labs    04/23/19 0631  NA 138  K 3.9  CL 106  CO2 23  GLUCOSE 100*  BUN 13  CREATININE 1.23  CALCIUM 9.1    Intake/Output Summary (Last 24 hours) at 04/24/2019 [id number] Last data filed at 04/23/2019 1815 Gross per 24 hour  Intake 600 ml  Output -  Net 600 ml     Physical Exam: Vital Signs Blood pressure 127/79, pulse 65, temperature 97.9 F (36.6 C), resp. rate 18, height 6' (1.829 m), weight (!) 146.9 kg, SpO2 97 %. Constitutional: No distress . Vital signs reviewed. HEENT: EOMI, oral membranes moist Neck: supple Cardiovascular: RRR without murmur. No JVD    Respiratory: CTA Bilaterally without wheezes or rales. Normal effort    GI: BS +, non-tender, non-distended  Musculoskeletal: LBP less  No pain with upper or lower limb range of motion Left shoulder abduction limited to around 70 deg Neurological: remains tangential  Speech clear. Ongoing Asterixis. Motor 4/5 UE, 3+ prox to 4/5 distally in LE's. No focal sensory findings.  Skin: Skin is warm and dry. He is not diaphoretic. tattoo left calf Venous stasis changes in LEs above ankle B/L---stable to improved Psychiatric:   pleasant   Assessment/Plan: 1. Functional deficits secondary to hepatic encephalopathy which require 3+ hours per day of interdisciplinary therapy in a comprehensive inpatient rehab setting.  Physiatrist is providing close team supervision and 24 hour management of active medical problems listed below.  Physiatrist and rehab team  continue to assess barriers to discharge/monitor patient progress toward functional and medical goals  Care Tool:  Bathing    Body parts bathed by patient: Right arm, Left arm, Chest, Abdomen, Buttocks, Front perineal area, Right upper leg, Left upper leg, Face, Right lower leg, Left lower leg   Body parts bathed by helper: Right lower leg, Left lower leg     Bathing assist Assist Level: Independent with assistive device Assistive Device Comment: LH sponge   Upper Body Dressing/Undressing Upper body dressing   What is the patient wearing?: Pull over shirt    Upper body assist Assist Level: Independent Assistive Device Comment: no clothing available  Lower Body Dressing/Undressing Lower body dressing      What is the patient wearing?: Pants     Lower body assist Assist for lower body dressing: Independent     Toileting Toileting    Toileting assist Assist for toileting: Supervision/Verbal cueing     Transfers Chair/bed transfer  Transfers assist     Chair/bed transfer assist level: Supervision/Verbal cueing     Locomotion Ambulation   Ambulation assist      Assist level: Supervision/Verbal cueing Assistive device: Walker-rolling Max distance: 200   Walk 10 feet activity   Assist     Assist level: Supervision/Verbal cueing Assistive device: Walker-rolling   Walk 50 feet activity   Assist    Assist level: Supervision/Verbal cueing Assistive device: Walker-rolling    Walk 150 feet activity   Assist    Assist level: Supervision/Verbal cueing Assistive device:  Walker-rolling    Walk 10 feet on uneven surface  activity   Assist Walk 10 feet on uneven surfaces activity did not occur: Safety/medical concerns         Wheelchair     Assist Will patient use wheelchair at discharge?: No             Wheelchair 50 feet with 2 turns activity    Assist            Wheelchair 150 feet activity     Assist           Blood pressure 127/79, pulse 65, temperature 97.9 F (36.6 C), resp. rate 18, height 6' (1.829 m), weight (!) 146.9 kg, SpO2 97 %. Medical Problem List and Plan: 1.  Functional deficits/balance deficits  secondary to hepatic encephalopathy  --Continue CIR therapies including PT, OT, and SLP ---team conference today  - RH placement pending 2.  Antithrombotics: -DVT/anticoagulation:  Pharmaceutical: Continue Lovenox 80 mg   -platelets holding 101k 11/2  -antiplatelet therapy: N/A 3. Pain Management:   ice, heat, topical modalities as needed 4. Mood: LCSW to follow for evaluation and support.              -antipsychotic agents: N/A 5. Neuropsych: This patient appears to be currently capable of making decisions on his own behalf.  -appreciate neuropsych eval  -continue ritalin trial for concentration 6. Skin/Wound Care: Routine pressure relief measures.  7. Fluids/Electrolytes/Nutrition: Monitor I/O.      8. HIV with progressive neutropenia: On Descovy and Pifeltro.  9. Cirrhosis of liver:    -lactulose   30mg  qid  -ammonia level down to 56 11/5---recheck in AM  -platelets remain stable  -LFT's normal 11/5 10. Ascites: Monitor daily weights. On Midamor and Lasix resumed 10/27   Filed Weights   04/21/19 0608 04/22/19 0500 04/24/19 0500  Weight: (!) 147.4 kg (!) 147.4 kg (!) 146.9 kg    -lasix 80mg  daily currently Weights are stable 11/10 11. Hypokalemia: has history of medication non compliance per records and per K+ was 2.6 on admission  -potassium 3.9 11/9---continue same supplement 12. Acute on chronic insomnia: He has used elavil 100 mg in the past--+/- effective.   -continue scheduled trazodone (100mg ) and titrate up as needed.   -sleeping better as a whole--no changes 13. Medical Noncompliance- has hx of not taking medications correctly.   -attention is directly related to this 14. Morbid Obesity- BMI 46- weight 155 kg-   bariatric bed and use bariatric equipment for pt-   15. 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 Venous stasis- changes on legs- will monitor for insufficiency ulcers.  17. GERD-  Protonix 40 mg daily 18. Nicotine dependence- continue Nicotine patch 7 mg daily 19. Dry mouth: biotene.        LOS: 12 days A FACE TO FACE EVALUATION WAS PERFORMED  Meredith Staggers 04/24/2019, 9:37 AM

## 2019-04-25 NOTE — Progress Notes (Signed)
[place] PHYSICAL MEDICINE & REHABILITATION PROGRESS NOTE   Subjective/Complaints:  No new issues overnight. Nothing has worked out as living arrangements are concerned. He is going to contact more of his family today. Denies pain, constipation.   ROS: Patient denies fever, rash, sore throat, blurred vision, nausea, vomiting, diarrhea, cough, shortness of breath or chest pain, joint or back pain, headache, or mood change.   Objective:   No results found. Recent Labs    04/23/19 0631  WBC 3.6*  HGB 13.1  HCT 40.1  PLT 85*   Recent Labs    04/23/19 0631  NA 138  K 3.9  CL 106  CO2 23  GLUCOSE 100*  BUN 13  CREATININE 1.23  CALCIUM 9.1    Intake/Output Summary (Last 24 hours) at 04/25/2019 [id number] Last data filed at 04/24/2019 1820 Gross per 24 hour  Intake 600 ml  Output -  Net 600 ml     Physical Exam: Vital Signs Blood pressure 128/64, pulse 67, temperature 98.1 F (36.7 C), temperature source Oral, resp. rate 15, height 6' (1.829 m), weight (!) 157 kg, SpO2 97 %. Constitutional: No distress . Vital signs reviewed, lying in bed.  HEENT: EOMI, oral membranes moist Neck: supple Cardiovascular: RRR without murmur. No JVD    Respiratory: CTA Bilaterally without wheezes or rales. Normal effort    GI: BS +, non-tender, non-distended  Musculoskeletal: LBP less  No pain with upper or lower limb range of motion Left shoulder abduction limited to around 70 deg Neurological: remains tangential  Speech clear. Ongoing Asterixis. Motor 4/5 UE, 3+ prox to 4/5 distally in LE's. No focal sensory findings.  Skin: Skin is warm and dry. He is not diaphoretic. tattoo left calf Venous stasis changes in LEs above ankle B/L---stable to improved Psychiatric:   pleasant   Assessment/Plan: 1. Functional deficits secondary to hepatic encephalopathy which require 3+ hours per day of interdisciplinary therapy in a comprehensive inpatient rehab setting.  Physiatrist is providing  close team supervision and 24 hour management of active medical problems listed below.  Physiatrist and rehab team continue to assess barriers to discharge/monitor patient progress toward functional and medical goals  Care Tool:  Bathing    Body parts bathed by patient: Right arm, Left arm, Chest, Abdomen, Buttocks, Front perineal area, Right upper leg, Left upper leg, Face, Right lower leg, Left lower leg   Body parts bathed by helper: Right lower leg, Left lower leg     Bathing assist Assist Level: Independent with assistive device Assistive Device Comment: LH sponge   Upper Body Dressing/Undressing Upper body dressing   What is the patient wearing?: Pull over shirt    Upper body assist Assist Level: Independent Assistive Device Comment: no clothing available  Lower Body Dressing/Undressing Lower body dressing      What is the patient wearing?: Pants     Lower body assist Assist for lower body dressing: Independent     Toileting Toileting    Toileting assist Assist for toileting: Supervision/Verbal cueing     Transfers Chair/bed transfer  Transfers assist     Chair/bed transfer assist level: Supervision/Verbal cueing     Locomotion Ambulation   Ambulation assist      Assist level: Supervision/Verbal cueing Assistive device: Walker-rolling Max distance: 200   Walk 10 feet activity   Assist     Assist level: Supervision/Verbal cueing Assistive device: Walker-rolling   Walk 50 feet activity   Assist    Assist level: Supervision/Verbal cueing Assistive  device: Walker-rolling    Walk 150 feet activity   Assist    Assist level: Supervision/Verbal cueing Assistive device: Walker-rolling    Walk 10 feet on uneven surface  activity   Assist Walk 10 feet on uneven surfaces activity did not occur: Safety/medical concerns         Wheelchair     Assist Will patient use wheelchair at discharge?: No             Wheelchair  50 feet with 2 turns activity    Assist            Wheelchair 150 feet activity     Assist          Blood pressure 128/64, pulse 67, temperature 98.1 F (36.7 C), temperature source Oral, resp. rate 15, height 6' (1.829 m), weight (!) 157 kg, SpO2 97 %. Medical Problem List and Plan: 1.  Functional deficits/balance deficits  secondary to hepatic encephalopathy  --Continue CIR therapies including PT, OT, and SLP  - RH placement pending 2.  Antithrombotics: -DVT/anticoagulation:  Pharmaceutical: Continue Lovenox 80 mg   -platelets holding 101k 11/2  -antiplatelet therapy: N/A 3. Pain Management:   ice, heat, topical modalities as needed 4. Mood: LCSW to follow for evaluation and support.              -antipsychotic agents: N/A 5. Neuropsych: This patient appears to be currently capable of making decisions on his own behalf.  -appreciate neuropsych eval  -continue ritalin trial for concentration 6. Skin/Wound Care: Routine pressure relief measures.  7. Fluids/Electrolytes/Nutrition: Monitor I/O.     8. HIV with progressive neutropenia: On Descovy and Pifeltro.  9. Cirrhosis of liver:    -lactulose   30mg  qid  -ammonia level down to 56 11/5---recheck in AM  -platelets remain stable  -LFT's normal 11/5 10. Ascites: Monitor daily weights. On Midamor and Lasix resumed 10/27   Filed Weights   04/22/19 0500 04/24/19 0500 04/25/19 0549  Weight: (!) 147.4 kg (!) 146.9 kg (!) 157 kg    -lasix 80mg  daily currently Weights are stable 11/10, large increase on 11/11, perhaps because taken in the middle of the night, usually taken at 5am.  11. Hypokalemia: has history of medication non compliance per records and per K+ was 2.6 on admission  -potassium 3.9 11/9---continue same supplement 12. Acute on chronic insomnia: He has used elavil 100 mg in the past--+/- effective.   -continue scheduled trazodone (100mg ) and titrate up as needed.   -sleeping better as a whole--no  changes 13. Medical Noncompliance- has hx of not taking medications correctly.   -attention is directly related to this 14. Morbid Obesity- BMI 46- weight 155 kg-   bariatric bed and use bariatric equipment for pt. 15. 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 Venous stasis- changes on legs- will monitor for insufficiency ulcers.  17. GERD-  Protonix 40 mg daily 18. Nicotine dependence- continue Nicotine patch 7 mg daily 19. Dry mouth: biotene.   20. Disposition: Patient will continue to contact family today to see who may be able to take him home.    LOS: 13 days A FACE TO FACE EVALUATION WAS PERFORMED  Daylee Delahoz P Paulkar 04/25/2019, 9:03 AM

## 2019-04-25 NOTE — Plan of Care (Signed)
  Problem: Consults Goal: RH GENERAL PATIENT EDUCATION Description: See Patient Education module for education specifics. Outcome: Progressing Goal: Skin Care Protocol Initiated - if Braden Score 18 or less Description: If consults are not indicated, leave blank or document N/A Outcome: Progressing   Problem: RH BOWEL ELIMINATION Goal: RH STG MANAGE BOWEL WITH ASSISTANCE Description: STG Manage Bowel with Moderate Assistance. Outcome: Progressing   Problem: RH SKIN INTEGRITY Goal: RH STG SKIN FREE OF INFECTION/BREAKDOWN Outcome: Progressing Goal: RH STG MAINTAIN SKIN INTEGRITY WITH ASSISTANCE Description: STG Maintain Skin Integrity With minimal Assistance. Outcome: Progressing   Problem: RH SAFETY Goal: RH STG ADHERE TO SAFETY PRECAUTIONS W/ASSISTANCE/DEVICE Description: STG Adhere to Safety Precautions With minimal Assistance/Device. Outcome: Progressing   Problem: RH PAIN MANAGEMENT Goal: RH STG PAIN MANAGED AT OR BELOW PT'S PAIN GOAL Description: Pain < 3/10 with pain medication  Outcome: Progressing   Problem: RH KNOWLEDGE DEFICIT GENERAL Goal: RH STG INCREASE KNOWLEDGE OF SELF CARE AFTER HOSPITALIZATION Outcome: Progressing
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
## 2019-04-26 LAB — AMMONIA: Ammonia: 110 umol/L — ABNORMAL HIGH (ref 9–35)

## 2019-04-26 NOTE — Plan of Care (Signed)
  Problem: RH BOWEL ELIMINATION Goal: RH STG MANAGE BOWEL WITH ASSISTANCE Description: STG Manage Bowel with Mod I Assistance. Outcome: Progressing   Problem: RH SKIN INTEGRITY Goal: RH STG MAINTAIN SKIN INTEGRITY WITH ASSISTANCE Description: STG Maintain Skin Integrity With mod I Assistance. Outcome: Progressing   Problem: RH SAFETY Goal: RH STG ADHERE TO SAFETY PRECAUTIONS W/ASSISTANCE/DEVICE Description: STG Adhere to Safety Precautions With cues and reminders Assistance/Device. Outcome: Progressing   Problem: RH PAIN MANAGEMENT Goal: RH STG PAIN MANAGED AT OR BELOW PT'S PAIN GOAL Description: Pain < 3/10 with pain medication  Outcome: Progressing

## 2019-04-26 NOTE — Progress Notes (Signed)
[place] PHYSICAL MEDICINE & REHABILITATION PROGRESS NOTE   Subjective/Complaints:  Feels a little nauseas and bloated this morning. Otherwise feeling ok. Still looking for somewhere to stay  ROS: Patient denies fever, rash, sore throat, blurred vision,  vomiting, diarrhea, cough, shortness of breath or chest pain, joint or back pain, headache, or mood change.   .   Objective:   No results found. No results for input(s): WBC, HGB, HCT, PLT in the last 72 hours. No results for input(s): NA, K, CL, CO2, GLUCOSE, BUN, CREATININE, CALCIUM in the last 72 hours.  Intake/Output Summary (Last 24 hours) at 04/26/2019 0933 Last data filed at 04/26/2019 0800 Gross per 24 hour  Intake 940 ml  Output -  Net 940 ml     Physical Exam: Vital Signs Blood pressure 129/67, pulse 62, temperature 98 F (36.7 C), temperature source Oral, resp. rate 15, height 6' (1.829 m), weight (!) 157.4 kg, SpO2 96 %. Constitutional: No distress . Vital signs reviewed. HEENT: EOMI, oral membranes moist Neck: supple Cardiovascular: RRR without murmur. No JVD    Respiratory: CTA Bilaterally without wheezes or rales. Normal effort    GI: BS +, non-tender, non-distended  Musculoskeletal: LBP less  No pain with upper or lower limb range of motion Left shoulder abduction limited to around 70 deg Neurological: remains tangential  Speech clear. Ongoing Asterixis. Motor 4/5 UE, 3+ prox to 4/5 distally in LE's. No focal sensory findings.  Skin: Skin is warm and dry. He is not diaphoretic. tattoo left calf Venous stasis changes in LEs above ankle B/L---actually look better Psychiatric:   pleasant   Assessment/Plan: 1. Functional deficits secondary to hepatic encephalopathy which require 3+ hours per day of interdisciplinary therapy in a comprehensive inpatient rehab setting.  Physiatrist is providing close team supervision and 24 hour management of active medical problems listed below.  Physiatrist and rehab  team continue to assess barriers to discharge/monitor patient progress toward functional and medical goals  Care Tool:  Bathing    Body parts bathed by patient: Right arm, Left arm, Chest, Abdomen, Buttocks, Front perineal area, Right upper leg, Left upper leg, Face, Right lower leg, Left lower leg   Body parts bathed by helper: Right lower leg, Left lower leg     Bathing assist Assist Level: Independent with assistive device Assistive Device Comment: LH sponge   Upper Body Dressing/Undressing Upper body dressing   What is the patient wearing?: Pull over shirt    Upper body assist Assist Level: Independent Assistive Device Comment: no clothing available  Lower Body Dressing/Undressing Lower body dressing      What is the patient wearing?: Pants     Lower body assist Assist for lower body dressing: Independent     Toileting Toileting    Toileting assist Assist for toileting: Supervision/Verbal cueing     Transfers Chair/bed transfer  Transfers assist     Chair/bed transfer assist level: Supervision/Verbal cueing     Locomotion Ambulation   Ambulation assist      Assist level: Supervision/Verbal cueing Assistive device: Walker-rolling Max distance: 200   Walk 10 feet activity   Assist     Assist level: Supervision/Verbal cueing Assistive device: Walker-rolling   Walk 50 feet activity   Assist    Assist level: Supervision/Verbal cueing Assistive device: Walker-rolling    Walk 150 feet activity   Assist    Assist level: Supervision/Verbal cueing Assistive device: Walker-rolling    Walk 10 feet on uneven surface  activity  Assist Walk 10 feet on uneven surfaces activity did not occur: Safety/medical concerns         Wheelchair     Assist Will patient use wheelchair at discharge?: No             Wheelchair 50 feet with 2 turns activity    Assist            Wheelchair 150 feet activity     Assist           Blood pressure 129/67, pulse 62, temperature 98 F (36.7 C), temperature source Oral, resp. rate 15, height 6' (1.829 m), weight (!) 157.4 kg, SpO2 96 %. Medical Problem List and Plan: 1.  Functional deficits/balance deficits  secondary to hepatic encephalopathy  --pt has been discharged from therapy  - RH placement still pending 2.  Antithrombotics: -DVT/anticoagulation:  Pharmaceutical: Continue Lovenox 80 mg   -platelets holding 101k 11/2  -antiplatelet therapy: N/A 3. Pain Management:   ice, heat, topical modalities as needed 4. Mood: LCSW to follow for evaluation and support.              -antipsychotic agents: N/A 5. Neuropsych: This patient appears to be currently capable of making decisions on his own behalf.  -appreciate neuropsych eval  -continue ritalin trial for concentration 6. Skin/Wound Care: Routine pressure relief measures.  7. Fluids/Electrolytes/Nutrition: Monitor I/O.      8. HIV with progressive neutropenia: On Descovy and Pifeltro.  9. Cirrhosis of liver:    -lactulose   30mg  qid  -ammonia level down to 56 11/5---recheck today  -platelets remain stable  -LFT's normal 11/5 10. Ascites: Monitor daily weights. On Midamor and Lasix resumed 10/27   Filed Weights   04/24/19 0500 04/25/19 0549 04/26/19 0503  Weight: (!) 146.9 kg (!) 157 kg (!) 157.4 kg    -lasix 80mg  daily currently Weights are stable 11/12 11. Hypokalemia: has history of medication non compliance per records and per K+ was 2.6 on admission  -potassium 3.9 11/9---continue same supplement 12. Acute on chronic insomnia: He has used elavil 100 mg in the past--+/- effective.   -continue scheduled trazodone (100mg ) and titrate up as needed.   -sleeping better as a whole--no changes 13. Medical Noncompliance- has hx of not taking medications correctly.   -attention is directly related to this 14. Morbid Obesity- BMI 46- weight 155 kg-   bariatric bed and use bariatric equipment for pt-   15. 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 Venous stasis- changes on legs- will monitor for insufficiency ulcers.  17. GERD-  Protonix 40 mg daily 18. Nicotine dependence- continue Nicotine patch 7 mg daily 19. Dry mouth: biotene.        LOS: 14 days A FACE TO FACE EVALUATION WAS PERFORMED  Meredith Staggers 04/26/2019, 9:33 AM

## 2019-04-27 MED ORDER — POTASSIUM CHLORIDE CRYS ER 20 MEQ PO TBCR: 40.0000 meq | EXTENDED_RELEASE_TABLET | Freq: Every day | ORAL | 0 refills | Status: DC

## 2019-04-27 MED ORDER — AMILORIDE HCL 5 MG PO TABS: 15.0000 mg | ORAL_TABLET | Freq: Every day | ORAL | 0 refills | Status: DC

## 2019-04-27 MED ORDER — LACTULOSE 10 GM/15ML PO SOLN: 30.0000 g | Freq: Four times a day (QID) | ORAL | 0 refills | Status: AC

## 2019-04-27 MED ORDER — HYDROXYZINE HCL 50 MG PO TABS: 50.0000 mg | ORAL_TABLET | Freq: Three times a day (TID) | ORAL | 0 refills | Status: DC | PRN

## 2019-04-27 MED ORDER — DESCOVY 200-25 MG PO TABS: 1.0000 | ORAL_TABLET | Freq: Every day | ORAL | 5 refills | Status: DC

## 2019-04-27 MED ORDER — NEXIUM 40 MG PO CPDR: 40.0000 mg | DELAYED_RELEASE_CAPSULE | Freq: Every day | ORAL | 0 refills | Status: DC

## 2019-04-27 MED ORDER — FOLIC ACID 1 MG PO TABS: 1.0000 mg | ORAL_TABLET | Freq: Every day | ORAL | 0 refills | Status: DC

## 2019-04-27 MED ORDER — TRAMADOL HCL 50 MG PO TABS: 50.0000 mg | ORAL_TABLET | Freq: Two times a day (BID) | ORAL | 0 refills | Status: DC | PRN

## 2019-04-27 MED ORDER — NICOTINE 7 MG/24HR TD PT24: 7.0000 mg | MEDICATED_PATCH | Freq: Every day | TRANSDERMAL | 0 refills | Status: DC

## 2019-04-27 MED ORDER — FUROSEMIDE 80 MG PO TABS: 80.0000 mg | ORAL_TABLET | Freq: Every day | ORAL | 0 refills | Status: DC

## 2019-04-27 MED ORDER — METHOCARBAMOL 500 MG PO TABS: 500.0000 mg | ORAL_TABLET | Freq: Four times a day (QID) | ORAL | 0 refills | Status: DC | PRN

## 2019-04-27 MED ORDER — DORAVIRINE 100 MG PO TABS: 100.0000 mg | ORAL_TABLET | Freq: Every day | ORAL | 0 refills | Status: DC

## 2019-04-27 MED ORDER — NYSTATIN 100000 UNIT/GM EX POWD: Freq: Two times a day (BID) | CUTANEOUS | 0 refills | Status: DC

## 2019-04-27 MED ORDER — TRAZODONE HCL 50 MG PO TABS: 25.0000 mg | ORAL_TABLET | Freq: Every evening | ORAL | 0 refills | Status: DC | PRN

## 2019-04-27 MED ORDER — NAPHAZOLINE-GLYCERIN 0.012-0.2 % OP SOLN: 1.0000 [drp] | Freq: Four times a day (QID) | OPHTHALMIC | 0 refills | Status: DC | PRN

## 2019-04-27 MED ORDER — MUSCLE RUB 10-15 % EX CREA: 1.0000 "application " | TOPICAL_CREAM | Freq: Three times a day (TID) | CUTANEOUS | 0 refills | Status: DC

## 2019-04-27 NOTE — Discharge Instructions (Signed)
Inpatient Rehab Discharge Instructions  [name] Discharge date and time: 04/27/19   Activities/Precautions/ Functional Status: Activity: no lifting, driving, or strenuous exercise for till cleared by MD Diet: cardiac diet--Low salt Wound Care: none needed   Functional status:  ___ No restrictions     ___ Walk up steps independently _X__ 24/7 supervision/assistance   ___ Walk up steps with assistance ___ Intermittent supervision/assistance  ___ Bathe/dress independently _X__ Walk with walker    ___ Bathe/dress with assistance ___ Walk Independently    ___ Shower independently ___ Walk with assistance    _X__ Shower with assistance _X__ No alcohol     ___ Return to work/school ________   Special Instructions: 1. Family or friend needs to assist with  Medication management/administration.   Marland KitchenCOMMUNITY REFERRALS UPON DISCHARGE:    Home Health:   PT     OT     ST    - these services are recommended - will refer once d/c location can be confirmed    Medical Equipment/Items Ordered: bari walker and bari commode                                                     Agency/Supplier:  [employer] @ [phone]    My questions have been answered and I understand these instructions. I will adhere to these goals and the provided educational materials after my discharge from the hospital.  Patient/Caregiver Signature _______________________________ Date __________  Clinician Signature _______________________________________ Date __________  Please bring this form and your medication list with you to all your follow-up doctor's appointments.

## 2019-04-27 NOTE — Progress Notes (Signed)
Patient discharged to "a friend's home" with patient belongs. Patient provided discharge instructions from Algis Liming, PA-C with verbal understanding. Patient discharged by way of cab to "a friend's home". Dr. Naaman Plummer aware of situation and feels patient is competent to make his on decision.
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
## 2019-04-27 NOTE — Progress Notes (Addendum)
Social Work Patient ID: [name], male   DOB: 25-May-1966, 53 y.o.   MRN: [id number]  Pt's friend did arrive and spoke with pt but declined to provide him with d/c transportation or a place to live.  Pt now planning to call a cab and "go to a friend's home".  Explained to pt, again, the risk this poses if he does not have support.  I have alerted Dr. Naaman Plummer who states he is ok with pt d/c and feels pt is competent to make this decision.  PA has provided pt with d/c paperwork.  Cannot arrange [place] as I do not have an address for where pt will be.  He declined to take the DME that had been ordered and states, "I don't need it."  Courtesy call made to pt's brother, [name], to alert him to the above information.  He plans to call pt to inform him that "if he leaves the hospital he can't come here and he's on his own."   Krisi Azua, LCSW

## 2019-04-27 NOTE — Progress Notes (Signed)
PHYSICAL MEDICINE & REHABILITATION PROGRESS NOTE   Subjective/Complaints:  No new issues. Anxious to leave hospital  ROS: Patient denies fever, rash, sore throat, blurred vision,   vomiting, diarrhea, cough, shortness of breath or chest pain, joint or back pain, headache, or mood change.  .   Objective:   No results found. No results for input(s): WBC, HGB, HCT, PLT in the last 72 hours. No results for input(s): NA, K, CL, CO2, GLUCOSE, BUN, CREATININE, CALCIUM in the last 72 hours.  Intake/Output Summary (Last 24 hours) at 04/27/2019 0904 Last data filed at 04/26/2019 1833 Gross per 24 hour  Intake 460 ml  Output -  Net 460 ml     Physical Exam: Vital Signs Blood pressure (!) 154/77, pulse 67, temperature 98.3 F (36.8 C), resp. rate 20, height 6' (1.829 m), weight (!) 147.4 kg, SpO2 98 %. Constitutional: No distress . Vital signs reviewed. HEENT: EOMI, oral membranes moist Neck: supple Cardiovascular: RRR without murmur. No JVD    Respiratory: CTA Bilaterally without wheezes or rales. Normal effort    GI: BS +, non-tender, non-distended  Musculoskeletal: LBP less  No pain with upper or lower limb range of motion Left shoulder abduction limited to around 70 deg Neurological: remains tangential--appears to be at cognitive baseline. Speech clear. persistent Asterixis. Motor 4/5 UE, 3+ prox to 4/5 distally in LE's. No focal sensory findings.  Skin: Skin is warm and dry. He is not diaphoretic. tattoo left calf Venous stasis changes in LEs above ankle B/L---actually look better Psychiatric:   pleasant   Assessment/Plan: 1. Functional deficits secondary to hepatic encephalopathy which require 3+ hours per day of interdisciplinary therapy in a comprehensive inpatient rehab setting.  Physiatrist is providing close team supervision and 24 hour management of active medical problems listed below.  Physiatrist and rehab team continue to assess barriers to  discharge/monitor patient progress toward functional and medical goals  Care Tool:  Bathing    Body parts bathed by patient: Right arm, Left arm, Chest, Abdomen, Buttocks, Front perineal area, Right upper leg, Left upper leg, Face, Right lower leg, Left lower leg   Body parts bathed by helper: Right lower leg, Left lower leg     Bathing assist Assist Level: Independent with assistive device Assistive Device Comment: LH sponge   Upper Body Dressing/Undressing Upper body dressing   What is the patient wearing?: Pull over shirt    Upper body assist Assist Level: Independent Assistive Device Comment: no clothing available  Lower Body Dressing/Undressing Lower body dressing      What is the patient wearing?: Pants     Lower body assist Assist for lower body dressing: Independent     Toileting Toileting    Toileting assist Assist for toileting: Supervision/Verbal cueing     Transfers Chair/bed transfer  Transfers assist     Chair/bed transfer assist level: Supervision/Verbal cueing     Locomotion Ambulation   Ambulation assist      Assist level: Supervision/Verbal cueing Assistive device: Walker-rolling Max distance: 200   Walk 10 feet activity   Assist     Assist level: Supervision/Verbal cueing Assistive device: Walker-rolling   Walk 50 feet activity   Assist    Assist level: Supervision/Verbal cueing Assistive device: Walker-rolling    Walk 150 feet activity   Assist    Assist level: Supervision/Verbal cueing Assistive device: Walker-rolling    Walk 10 feet on uneven surface  activity   Assist Walk 10 feet on uneven surfaces activity  did not occur: Safety/medical concerns         Wheelchair     Assist Will patient use wheelchair at discharge?: No             Wheelchair 50 feet with 2 turns activity    Assist            Wheelchair 150 feet activity     Assist          Blood pressure (!)  154/77, pulse 67, temperature 98.3 F (36.8 C), resp. rate 20, height 6' (1.829 m), weight (!) 147.4 kg, SpO2 98 %. Medical Problem List and Plan: 1.  Functional deficits/balance deficits  secondary to hepatic encephalopathy  --pt has been discharged from therapy  - RH placement still pending 2.  Antithrombotics: -DVT/anticoagulation:  Pharmaceutical: Continue Lovenox 80 mg   -platelets holding 101k 11/2  -antiplatelet therapy: N/A 3. Pain Management:   ice, heat, topical modalities as needed 4. Mood: LCSW to follow for evaluation and support.              -antipsychotic agents: N/A 5. Neuropsych: This patient appears to be currently capable of making decisions on his own behalf.  -appreciate neuropsych eval  -continue ritalin trial for concentration 6. Skin/Wound Care: Routine pressure relief measures.  7. Fluids/Electrolytes/Nutrition: Monitor I/O.      8. HIV with progressive neutropenia: On Descovy and Pifeltro.  9. Cirrhosis of liver:    -lactulose   30mg  qid  -ammonia level down to 56 11/5---up to 110 yesterday without any changes in his regimen.  -platelets remain stable  -LFT's normal 11/5 10. Ascites: Monitor daily weights. On Midamor and Lasix resumed 10/27   Filed Weights   04/25/19 0549 04/26/19 0503 04/27/19 0702  Weight: (!) 157 kg (!) 157.4 kg (!) 147.4 kg    -lasix 80mg  daily currently  -does not appear fluid overloaded. Suspect weights from 11/11 and 11/12 are inaccurate.  11. Hypokalemia: has history of medication non compliance per records and per K+ was 2.6 on admission  -potassium 3.9 11/9---continue same supplement 12. Acute on chronic insomnia: He has used elavil 100 mg in the past--+/- effective.   -continue scheduled trazodone (100mg )--reasonable results 13. Medical Noncompliance- has hx of not taking medications correctly.   -attention is directly related to this 14. Morbid Obesity- BMI 46- weight 155 kg-   bariatric bed and use bariatric equipment for  pt-  15. 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 Venous stasis- changes on legs- will monitor for insufficiency ulcers.  17. GERD-  Protonix 40 mg daily 18. Nicotine dependence- continue Nicotine patch 7 mg daily 19. Dry mouth: biotene.        LOS: 15 days A FACE TO FACE EVALUATION WAS PERFORMED  Meredith Staggers 04/27/2019, 9:04 AM

## 2019-04-29 ENCOUNTER — Inpatient Hospital Stay (HOSPITAL_COMMUNITY): Payer: Medicaid Other

## 2019-04-30 NOTE — Progress Notes (Signed)
Social Work Discharge Note   The overall goal for the admission was met for:   Discharge location: NO* - pt left the hospital to "get a cab and go to my friend's house."  He could not tell me address of where he was going.  MD ok'd his leaving.  (see prior SW progress notes)  Length of Stay: No - plan had changed to [facility] as supervision was recommended and stay prolonged due to no success in finding RH bed.  Discharge activity level: Yes - supervision  Home/community participation: Yes  Services provided included: MD, RD, PT, OT, SLP, RN, TR, Pharmacy, Neuropsych and SW  Financial Services: Medicaid  Follow-up services arranged: Home Health: None arranged as pt unable to provide address for where he was to d/c to., DME: Bari walker and bari comode ordered via [place] and pt declined to take with him at d/c and asked for it to be returned, "I don't need it." and Patient/Family has no preference for HH/DME agencies  Comments (or additional information):  Patient/Family verbalized understanding of follow-up arrangements:  NA (see above)  Individual responsible for coordination of the follow-up plan: pt - per MD, pt is capable of making decisions on his own behalf.  Confirmed correct DME delivered: Lennart Pall 04/30/2019    Chritopher Coster

## 2019-04-30 NOTE — Discharge Summary (Signed)
Physician Discharge Summary  Patient ID: [name] MRN: [id number] DOB/AGE: 08/11/1965 53 y.o.  Admit date: 04/12/2019 Discharge date: 04/27/2019  Discharge Diagnoses:  Principal Problem:   Encephalopathy, hepatic ([place]) Active Problems:   Human immunodeficiency virus (HIV) disease ([place])   Essential hypertension   Alcoholic cirrhosis of liver ([facility])   Secondary esophageal varices without bleeding ([facility])   Hyperammonemia ([facility])   Discharged Condition: stable   Significant Diagnostic Studies: N/A   Labs:  Basic Metabolic Panel: BMP Latest Ref Rng & Units 04/23/2019 04/19/2019 04/18/2019  Glucose 70 - 99 mg/dL 100(H) 100(H) 102(H)  BUN 6 - 20 mg/dL 13 12 12   Creatinine 0.61 - 1.24 mg/dL 1.23 1.22 1.07  BUN/Creat Ratio 6 - 22 (calc) - - -  Sodium 135 - 145 mmol/L 138 138 139  Potassium 3.5 - 5.1 mmol/L 3.9 3.7 3.9  Chloride 98 - 111 mmol/L 106 105 104  CO2 22 - 32 mmol/L 23 23 25   Calcium 8.9 - 10.3 mg/dL 9.1 9.3 9.1    CBC: CBC Latest Ref Rng & Units 04/23/2019 04/16/2019 04/13/2019  WBC 4.0 - 10.5 K/uL 3.6(L) 3.2(L) 3.2(L)  Hemoglobin 13.0 - 17.0 g/dL 13.1 13.9 13.1  Hematocrit 39.0 - 52.0 % 40.1 44.1 41.2  Platelets 150 - 400 K/uL 85(L) 101(L) 103(L)    CBG: No results for input(s): GLUCAP in the last 168 hours.  Brief HPI:   [name] is a 53 y.o. male with history of HTN , GERD, alcohol abuse with cirrhosis, morbid obesity and Korsakoff psychosis in the past who was admitted on 04/08/19 with progressive weakness, balance deficits, fall with decline in LOC. He was found to have asterixis with metabolic encephalopathy. He ws started on lactulose due to elevated ammonia levels and hypokalemia supplemented. Mentation improved but he continued to be limited by debility, cognitive deficits as well as tremulous movements BLE. Patient with decline in functional status and CIR recommended for follow up therapy.    Hospital Course: [name] was admitted to rehab  04/12/2019 for inpatient therapies to consist of PT, ST and OT at least three hours five days a week. Past admission physiatrist, therapy team and rehab RN have worked together to provide customized collaborative inpatient rehab.  Serial CBC shows that white count is improving and platelets are relatively stable. Ammonia levels have been monitored with serial checks and was elevated to 123 at admission therefore lactulose was increased to qid. Levels have varied from 56 to 110.  Check of lytes showed potassium levels to be stable on current dose of lasix. His mental status has been stable and recommend follow up labs in 1-2 weeks for monitoring. Dr. Sima Matas was consulted for input on mood and cognition and felt that patient was appropriate and competent to make decisions. He has progressed to supervision level and search for rest home was initiated due to lack of discharge location.  Patient reported that he was tired of the wait for facility and reported discharge to a friend's home.  Supervision is recommended for safety and medication compliance.  No follow up set up as address not provided by patient.    Rehab course: During patient's stay in rehab weekly team conferences were held to monitor patient's progress, set goals and discuss barriers to discharge. At admission, patient required CGA for mobility and mod assist with ADL tasks. He exhibited deficits in semi-complex problem solving. He  has had improvement in activity tolerance, balance, postural control as well as ability  to compensate for deficits. He is able to complete ADL tasks at modified independent level. He requires supervision with transfers and to ambulate 200' with RW.     Disposition: Home.  Diet: Regular  Special Instructions: 1. No strenuous activity. 2. Needs to follow up with PCP in 1-2 weeks.  3. Repeat CBC and ammonia levels in 1-2 weeks.    Allergies as of 04/27/2019      Reactions   Penicillins    REACTION:  hives Has patient had a PCN reaction causing immediate rash, facial/tongue/throat swelling, SOB or lightheadedness with hypotension: Yes Has patient had a PCN reaction causing severe rash involving mucus membranes or skin necrosis: Yes Has patient had a PCN reaction that required hospitalization No Has patient had a PCN reaction occurring within the last 10 years: Yes If all of the above answers are "NO", then may proceed with Cephalosporin use./Per pt makes him feel weird!   Sulfa Antibiotics    Pt states he did not have a reaction last time to Sulfa!      Medication List    STOP taking these medications   benzocaine 10 % mucosal gel Commonly known as: ORAJEL     TAKE these medications   aMILoride 5 MG tablet Commonly known as: MIDAMOR Take 3 tablets (15 mg total) by mouth daily.   Descovy 200-25 MG tablet Generic drug: emtricitabine-tenofovir AF Take 1 tablet by mouth daily.   doravirine 100 MG Tabs tablet Commonly known as: PIFELTRO Take 1 tablet (100 mg total) by mouth daily.   folic acid 1 MG tablet Commonly known as: FOLVITE Take 1 tablet (1 mg total) by mouth daily. Notes to patient: Over the counter   furosemide 80 MG tablet Commonly known as: LASIX Take 1 tablet (80 mg total) by mouth daily.   hydrOXYzine 50 MG tablet Commonly known as: ATARAX/VISTARIL Take 1 tablet (50 mg total) by mouth every 8 (eight) hours as needed for anxiety (mild/moderate anxiety). What changed: when to take this   lactulose 10 GM/15ML solution Commonly known as: CHRONULAC Take 45 mLs (30 g total) by mouth 4 (four) times daily.   methocarbamol 500 MG tablet Commonly known as: ROBAXIN Take 1 tablet (500 mg total) by mouth every 6 (six) hours as needed for muscle spasms. What changed:   medication strength  how much to take  when to take this  reasons to take this   multivitamins with iron Tabs tablet Take 1 tablet by mouth daily.   Muscle Rub 10-15 % Crea Apply 1  application topically 4 (four) times daily -  before meals and at bedtime.   naphazoline-glycerin 0.012-0.2 % Soln Commonly known as: CLEAR EYES REDNESS Place 1-2 drops into both eyes 4 (four) times daily as needed for eye irritation.   NexIUM 40 MG capsule Generic drug: esomeprazole Take 1 capsule (40 mg total) by mouth daily at 12 noon. What changed: See the new instructions.   nicotine 7 mg/24hr patch Commonly known as: NICODERM CQ - dosed in mg/24 hr Place 1 patch (7 mg total) onto the skin daily.   nicotine polacrilex 2 MG gum Commonly known as: NICORETTE Take 1 each (2 mg total) by mouth as needed for smoking cessation. (may purchase from over the counter): For smoking cessation   nystatin powder Commonly known as: MYCOSTATIN/NYSTOP Apply topically 2 (two) times daily.   potassium chloride SA 20 MEQ tablet Commonly known as: KLOR-CON Take 2 tablets (40 mEq total) by mouth daily. What changed:  how much to take   thiamine 100 MG tablet Take 1 tablet (100 mg total) by mouth daily.   traMADol 50 MG tablet Commonly known as: ULTRAM Take 1 tablet (50 mg total) by mouth every 12 (twelve) hours as needed for severe pain.   traZODone 50 MG tablet Commonly known as: DESYREL Take 0.5-1 tablets (25-50 mg total) by mouth at bedtime as needed for sleep. What changed: how much to take      Follow-up Information    Meredith Staggers, MD Follow up.   Specialty: Physical Medicine and Rehabilitation Why: office will call you with follow up appointment Contact information: [address] [place] [address] [phone]        Greig Right, MD. Call on 04/23/2019.   Specialty: Family Medicine Why: for post hospital follow up Contact information: [address] [place] [address] [place], Lavell Islam, MD. Call.   Specialty: Infectious Diseases Why: for routine follow up Contact information: [address] [place] [address] [phone]        Gatha Mayer, MD. Call on 04/30/2019.   Specialty: Gastroenterology Why: for follow up on Liver and any medication changes Contact information: [address] [place] [address] [phone]           Signed: Bary Leriche 04/30/2019, 10:29 AM

## 2019-05-01 ENCOUNTER — Telehealth: Payer: Self-pay | Admitting: *Deleted

## 2019-05-01 NOTE — Telephone Encounter (Signed)
Transitional care call attempted.  [phone] Contacted patient's phone #, recording states number has either been changed, discontinued or cancelled

## 2019-05-03 NOTE — Telephone Encounter (Signed)
Unable to reach patient. We were told this would be likely as patient appears to be without a place to call home.

## 2019-05-08 ENCOUNTER — Encounter
Payer: Medicaid Other | Attending: Physical Medicine and Rehabilitation | Admitting: Physical Medicine and Rehabilitation

## 2019-05-14 ENCOUNTER — Other Ambulatory Visit: Payer: Medicaid Other

## 2019-05-14 NOTE — Addendum Note (Signed)
Addended by: Dolan Amen D on: 05/14/2019 10:30 AM   Modules accepted: Orders

## 2019-05-18 DIAGNOSIS — Z789 Other specified health status: Secondary | ICD-10-CM | POA: Insufficient documentation

## 2019-05-18 HISTORY — DX: Other specified health status: Z78.9

## 2019-05-21 ENCOUNTER — Telehealth: Payer: Self-pay

## 2019-05-21 NOTE — Telephone Encounter (Signed)
Received a call today from Blima Rich, hopitalist at [facility] in [place], [place]. States that patient is currently admitted at hospital; has been homeless for 3 weeks now. Patient is unable to tell hospitalist the last time he took his medication. Pharmacy state that patient last picked up Pifeltro 10/8 and Descovy 11/13. Recent CD4 done was absolute: 279 and 31%. VL is still pending. Hospitalist would like MD to be aware of patient's current situation.  Will forward message to MD so he is aware. Hospitalist: [phone] Nurses line: [place], [place]
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
## 2019-05-29 ENCOUNTER — Ambulatory Visit: Payer: Medicaid Other | Admitting: Infectious Disease

## 2019-06-19 ENCOUNTER — Telehealth: Payer: Self-pay | Admitting: *Deleted

## 2019-06-19 NOTE — Telephone Encounter (Signed)
Per front desk, patient is at a Skilled nursing facility. They are unable to bring the patient in person as there is a covid outbreak going on in the facility.  They are asking for an e-visit if at all possible. Landis Gandy, RN

## 2019-06-20 ENCOUNTER — Inpatient Hospital Stay: Payer: Medicaid Other | Admitting: Infectious Disease

## 2019-06-20 NOTE — Telephone Encounter (Signed)
We can reschedule for an e visit. I would like to get a viral load on him at some point. Maybe one was done at other hospital?

## 2019-06-20 NOTE — Telephone Encounter (Signed)
Viral load available via care everywhere 05/29/19.

## 2019-06-21 NOTE — Telephone Encounter (Signed)
Thanks so much Michelle 

## 2019-06-26 ENCOUNTER — Telehealth: Payer: Self-pay | Admitting: *Deleted

## 2019-06-26 NOTE — Telephone Encounter (Signed)
RN returned the call (number is [place]), let him know that Dr Tommy Medal cannot admit him to or direct his physical rehab.  Was on hold for 5 minutes waiting for Amin before disconnecting the call. He is scheduled for evisit 1/18. Landis Gandy, RN

## 2019-06-26 NOTE — Telephone Encounter (Signed)
-----   Message from [facility] sent at 06/25/2019 12:03 PM EST ----- Patient called to set up appointment with Dr. Lucianne Lei dam, but already has an E-Visit set up for 07/02/19. He wants the provider to get him in with [facility] due to a fall he had over the holidays, patient is currently at  [facility], hospital and can be reached at [phone].

## 2019-07-02 ENCOUNTER — Ambulatory Visit (INDEPENDENT_AMBULATORY_CARE_PROVIDER_SITE_OTHER): Payer: Medicaid Other | Admitting: Infectious Disease

## 2019-07-02 ENCOUNTER — Encounter: Payer: Self-pay | Admitting: Infectious Disease

## 2019-07-02 ENCOUNTER — Other Ambulatory Visit: Payer: Self-pay

## 2019-07-02 DIAGNOSIS — K7290 Hepatic failure, unspecified without coma: Secondary | ICD-10-CM | POA: Diagnosis not present

## 2019-07-02 DIAGNOSIS — F332 Major depressive disorder, recurrent severe without psychotic features: Secondary | ICD-10-CM

## 2019-07-02 DIAGNOSIS — K7682 Hepatic encephalopathy: Secondary | ICD-10-CM

## 2019-07-02 DIAGNOSIS — F04 Amnestic disorder due to known physiological condition: Secondary | ICD-10-CM

## 2019-07-02 DIAGNOSIS — T7611XD Adult physical abuse, suspected, subsequent encounter: Secondary | ICD-10-CM

## 2019-07-02 DIAGNOSIS — F1020 Alcohol dependence, uncomplicated: Secondary | ICD-10-CM

## 2019-07-02 DIAGNOSIS — S42215G Unspecified nondisplaced fracture of surgical neck of left humerus, subsequent encounter for fracture with delayed healing: Secondary | ICD-10-CM

## 2019-07-02 DIAGNOSIS — S42309A Unspecified fracture of shaft of humerus, unspecified arm, initial encounter for closed fracture: Secondary | ICD-10-CM

## 2019-07-02 DIAGNOSIS — B20 Human immunodeficiency virus [HIV] disease: Secondary | ICD-10-CM

## 2019-07-02 HISTORY — DX: Unspecified fracture of shaft of humerus, unspecified arm, initial encounter for closed fracture: S42.309A
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
## 2019-08-09 ENCOUNTER — Other Ambulatory Visit: Payer: Self-pay

## 2019-08-09 ENCOUNTER — Encounter: Payer: Self-pay | Admitting: Infectious Disease

## 2019-08-09 ENCOUNTER — Ambulatory Visit (INDEPENDENT_AMBULATORY_CARE_PROVIDER_SITE_OTHER): Payer: Medicaid Other | Admitting: Infectious Disease

## 2019-08-09 DIAGNOSIS — F332 Major depressive disorder, recurrent severe without psychotic features: Secondary | ICD-10-CM

## 2019-08-09 DIAGNOSIS — M96622 Fracture of humerus following insertion of orthopedic implant, joint prosthesis, or bone plate, left arm: Secondary | ICD-10-CM

## 2019-08-09 DIAGNOSIS — D61818 Other pancytopenia: Secondary | ICD-10-CM | POA: Diagnosis not present

## 2019-08-09 DIAGNOSIS — F1020 Alcohol dependence, uncomplicated: Secondary | ICD-10-CM

## 2019-08-09 DIAGNOSIS — B20 Human immunodeficiency virus [HIV] disease: Secondary | ICD-10-CM | POA: Diagnosis not present

## 2019-08-09 DIAGNOSIS — T7611XD Adult physical abuse, suspected, subsequent encounter: Secondary | ICD-10-CM

## 2019-08-09 MED ORDER — DESCOVY 200-25 MG PO TABS: 1.0000 | ORAL_TABLET | Freq: Every day | ORAL | 11 refills | Status: DC

## 2019-08-09 MED ORDER — DORAVIRINE 100 MG PO TABS: 100.0000 mg | ORAL_TABLET | Freq: Every day | ORAL | 11 refills | Status: DC
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
## 2019-08-10 ENCOUNTER — Telehealth (INDEPENDENT_AMBULATORY_CARE_PROVIDER_SITE_OTHER): Payer: Medicaid Other | Admitting: Family Medicine

## 2019-08-10 ENCOUNTER — Encounter: Payer: Self-pay | Admitting: Family Medicine

## 2019-08-10 DIAGNOSIS — E785 Hyperlipidemia, unspecified: Secondary | ICD-10-CM | POA: Diagnosis not present

## 2019-08-10 DIAGNOSIS — E876 Hypokalemia: Secondary | ICD-10-CM

## 2019-08-10 DIAGNOSIS — G4700 Insomnia, unspecified: Secondary | ICD-10-CM | POA: Diagnosis not present

## 2019-08-10 DIAGNOSIS — B001 Herpesviral vesicular dermatitis: Secondary | ICD-10-CM

## 2019-08-10 DIAGNOSIS — N528 Other male erectile dysfunction: Secondary | ICD-10-CM | POA: Diagnosis not present

## 2019-08-10 HISTORY — DX: Herpesviral vesicular dermatitis: B00.1
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
## 2019-08-17 ENCOUNTER — Telehealth: Payer: Self-pay

## 2019-08-17 NOTE — Telephone Encounter (Signed)
Received fax from [facility] requesting last office note be faxed. Will fax copy of note to Ike Bene, HIM.  F [phone] Aundria Rud, CMA

## 2019-08-21 ENCOUNTER — Telehealth: Payer: Self-pay

## 2019-08-21 ENCOUNTER — Other Ambulatory Visit: Payer: Self-pay | Admitting: Internal Medicine

## 2019-08-21 DIAGNOSIS — B001 Herpesviral vesicular dermatitis: Secondary | ICD-10-CM

## 2019-08-21 MED ORDER — ACYCLOVIR 400 MG PO TABS: 400.0000 mg | ORAL_TABLET | Freq: Three times a day (TID) | ORAL | 3 refills | Status: DC

## 2019-08-21 NOTE — Telephone Encounter (Signed)
Acyclovir prescription sent.

## 2019-08-21 NOTE — Telephone Encounter (Signed)
Patient is calling for Zovirax refill. I do not see this in his medication list as active or past history. He states he normally takes this for sores in his mouth that usually move to his lips.  He was seen via a phone visit with Dr Tommy Medal on 08-09-2019 and this was not mentioned.  Please advise.  Laverle Patter, RN

## 2019-08-27 NOTE — Telephone Encounter (Signed)
Thanks John.

## 2019-09-07 ENCOUNTER — Encounter
Payer: Medicaid Other | Attending: Physical Medicine and Rehabilitation | Admitting: Physical Medicine and Rehabilitation

## 2019-09-10 ENCOUNTER — Ambulatory Visit: Payer: Medicaid Other | Admitting: Infectious Disease

## 2019-09-18 ENCOUNTER — Other Ambulatory Visit: Payer: Self-pay | Admitting: Internal Medicine

## 2019-09-18 NOTE — Telephone Encounter (Signed)
I have not seen Leto since 2019.  He needs to see me again  I will refill x 2 though

## 2019-09-18 NOTE — Telephone Encounter (Signed)
Left message for patient to call and make appointment. I refilled his medicine.

## 2019-09-18 NOTE — Telephone Encounter (Signed)
Please advise [name], thank you. 

## 2019-09-19 ENCOUNTER — Telehealth: Payer: Self-pay | Admitting: Internal Medicine

## 2019-09-19 NOTE — Telephone Encounter (Signed)
Patient's Aunt notified

## 2019-09-19 NOTE — Telephone Encounter (Signed)
Are you willing to fill out and sign form for them?

## 2019-09-19 NOTE — Telephone Encounter (Signed)
No Not seen since 2019

## 2019-09-19 NOTE — Telephone Encounter (Signed)
Pt's aunt Vaughan Basta called requesting if Dr. Carlean Purl could sign form for skilled nursing facility that pt is trying to move into. Pls call Linda at [phone].

## 2019-09-21 ENCOUNTER — Encounter (HOSPITAL_COMMUNITY): Payer: Self-pay

## 2019-09-21 ENCOUNTER — Other Ambulatory Visit: Payer: Self-pay

## 2019-09-21 ENCOUNTER — Emergency Department (HOSPITAL_COMMUNITY)
Admission: EM | Admit: 2019-09-21 | Discharge: 2019-09-21 | Disposition: A | Discharge status: Home / Self Care | Payer: Medicaid Other | Attending: Emergency Medicine | Admitting: Emergency Medicine

## 2019-09-21 DIAGNOSIS — Z79899 Other long term (current) drug therapy: Secondary | ICD-10-CM | POA: Diagnosis not present

## 2019-09-21 DIAGNOSIS — Z21 Asymptomatic human immunodeficiency virus [HIV] infection status: Secondary | ICD-10-CM | POA: Insufficient documentation

## 2019-09-21 DIAGNOSIS — G8929 Other chronic pain: Secondary | ICD-10-CM | POA: Diagnosis not present

## 2019-09-21 DIAGNOSIS — Z87891 Personal history of nicotine dependence: Secondary | ICD-10-CM | POA: Insufficient documentation

## 2019-09-21 DIAGNOSIS — I10 Essential (primary) hypertension: Secondary | ICD-10-CM | POA: Diagnosis not present

## 2019-09-21 DIAGNOSIS — M25512 Pain in left shoulder: Secondary | ICD-10-CM | POA: Diagnosis present

## 2019-09-21 MED ORDER — LIDOCAINE 5 % EX PTCH
1.0000 | MEDICATED_PATCH | CUTANEOUS | Status: DC
  Administered 2019-09-21: 14:00:00 1 via TRANSDERMAL
  Filled 2019-09-21: qty 1

## 2019-09-21 NOTE — Discharge Instructions (Addendum)
Contact Dr. Naaman Plummer office to schedule an appointment.

## 2019-09-21 NOTE — ED Triage Notes (Signed)
Pt from [place] [facility] for chronic back pain, pt needed a ride to his PCP office that is here in [place] but ems told him he could only be taken to the ER by them and pt agreed
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
## 2019-09-28 ENCOUNTER — Other Ambulatory Visit: Payer: Self-pay | Admitting: Internal Medicine

## 2019-10-02 ENCOUNTER — Telehealth: Payer: Self-pay | Admitting: Family Medicine

## 2019-10-02 ENCOUNTER — Other Ambulatory Visit: Payer: Self-pay | Admitting: Family Medicine

## 2019-10-02 ENCOUNTER — Other Ambulatory Visit: Payer: Self-pay | Admitting: *Deleted

## 2019-10-02 MED ORDER — FUROSEMIDE 80 MG PO TABS: 80.0000 mg | ORAL_TABLET | Freq: Every day | ORAL | 0 refills | Status: DC

## 2019-10-02 MED ORDER — NEXIUM 40 MG PO CPDR: 40.0000 mg | DELAYED_RELEASE_CAPSULE | Freq: Every day | ORAL | 0 refills | Status: DC

## 2019-10-02 NOTE — Telephone Encounter (Signed)
Patient called in needing Rx refills  He needs cream for his lips for fever blisters  BP medicine that he doesn't know the name  furosemide (LASIX) 80 MG tablet  NEXIUM 40 MG capsule   Send to:  [facility], [place] [address] Phone:  [phone]  Fax:  [phone]

## 2019-10-02 NOTE — Telephone Encounter (Signed)
Message Routed to PCP for review and approval. 

## 2019-10-08 ENCOUNTER — Other Ambulatory Visit: Payer: Self-pay | Admitting: Family Medicine

## 2019-10-08 ENCOUNTER — Encounter: Payer: Self-pay | Admitting: *Deleted

## 2019-10-08 MED ORDER — NEXIUM 40 MG PO CPDR: 40.0000 mg | DELAYED_RELEASE_CAPSULE | Freq: Every day | ORAL | 1 refills | Status: DC

## 2019-10-08 NOTE — Telephone Encounter (Signed)
Fever blister medication is being prescribed by ID. Nexium rx sent. Furosemide was sent on 10/02/19. Thanks, BJ

## 2019-10-08 NOTE — Telephone Encounter (Signed)
Patient notified

## 2019-10-11 ENCOUNTER — Telehealth: Payer: Self-pay | Admitting: Family Medicine

## 2019-10-11 NOTE — Telephone Encounter (Signed)
Pt stated he needs a referral to see Dr. Naaman Plummer for pain in shoulder (shattered). He has seen him last October.   Pt can be reached at [phone]

## 2019-10-12 ENCOUNTER — Other Ambulatory Visit: Payer: Self-pay | Admitting: *Deleted

## 2019-10-12 DIAGNOSIS — M25519 Pain in unspecified shoulder: Secondary | ICD-10-CM

## 2019-10-12 DIAGNOSIS — G8929 Other chronic pain: Secondary | ICD-10-CM

## 2019-10-12 NOTE — Telephone Encounter (Addendum)
Pt states he is in a lot of pain and is needing the referral sent to Dr. Naaman Plummer at the [facility] rehabilitation center. He is currently staying at hotels and is unable to continue due to being on disability and states he would like to get in the facility tonight.   Pt can be reached at [phone] or [phone]

## 2019-10-12 NOTE — Telephone Encounter (Signed)
Referral can be placed as requested. ?Thanks, ?BJ ?

## 2019-10-12 NOTE — Telephone Encounter (Signed)
Referral placed as requested.

## 2019-10-12 NOTE — Telephone Encounter (Signed)
Patient scheduled for 10/15/2019.

## 2019-10-12 NOTE — Telephone Encounter (Signed)
Please advise 

## 2019-10-15 ENCOUNTER — Telehealth: Payer: Medicaid Other | Admitting: Family Medicine

## 2019-10-15 ENCOUNTER — Telehealth: Payer: Self-pay | Admitting: *Deleted

## 2019-10-15 ENCOUNTER — Encounter: Payer: Self-pay | Admitting: Family Medicine
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
## 2019-10-15 NOTE — Telephone Encounter (Signed)
RN attempted to call Dimitris, went to voicemail. RN left message regarding his request for acyclovir refill, let him know it was sent with 3 refills to [facility]. This medication concern is mentioned in previous messages. RN attempted to call his Westley Foots, went to voicemail. RN left message with [facility]'s phone number regarding housing questions, asked her to call us regarding Alixander's questions if he still wanted to speak with Dr Hubert Azure, Lanice Schwab, RN

## 2019-10-16 ENCOUNTER — Ambulatory Visit: Payer: Medicaid Other | Attending: Family Medicine | Admitting: Physical Therapy

## 2019-10-16 ENCOUNTER — Other Ambulatory Visit: Payer: Self-pay

## 2019-10-16 ENCOUNTER — Encounter: Payer: Self-pay | Admitting: Physical Therapy

## 2019-10-16 ENCOUNTER — Telehealth (INDEPENDENT_AMBULATORY_CARE_PROVIDER_SITE_OTHER): Payer: Medicaid Other | Admitting: Family Medicine

## 2019-10-16 ENCOUNTER — Encounter: Payer: Self-pay | Admitting: Family Medicine

## 2019-10-16 ENCOUNTER — Telehealth: Payer: Self-pay

## 2019-10-16 DIAGNOSIS — M6281 Muscle weakness (generalized): Secondary | ICD-10-CM

## 2019-10-16 DIAGNOSIS — R296 Repeated falls: Secondary | ICD-10-CM | POA: Diagnosis not present

## 2019-10-16 DIAGNOSIS — G8929 Other chronic pain: Secondary | ICD-10-CM | POA: Insufficient documentation

## 2019-10-16 DIAGNOSIS — F331 Major depressive disorder, recurrent, moderate: Secondary | ICD-10-CM

## 2019-10-16 DIAGNOSIS — K7031 Alcoholic cirrhosis of liver with ascites: Secondary | ICD-10-CM | POA: Diagnosis not present

## 2019-10-16 DIAGNOSIS — M25512 Pain in left shoulder: Secondary | ICD-10-CM | POA: Diagnosis not present

## 2019-10-16 DIAGNOSIS — M25612 Stiffness of left shoulder, not elsewhere classified: Secondary | ICD-10-CM | POA: Diagnosis present

## 2019-10-16 DIAGNOSIS — Z598 Other problems related to housing and economic circumstances: Secondary | ICD-10-CM | POA: Diagnosis not present

## 2019-10-16 DIAGNOSIS — R293 Abnormal posture: Secondary | ICD-10-CM | POA: Insufficient documentation

## 2019-10-16 DIAGNOSIS — Z5989 Other problems related to housing and economic circumstances: Secondary | ICD-10-CM

## 2019-10-16 NOTE — Patient Instructions (Signed)
Access Code: [email or web address] URL: [email or web address] Date: 10/16/2019 Prepared by: Hilda Blades  Exercises Supine Shoulder Flexion Extension AAROM with Dowel - 1-2 x daily - 10 reps - 3-5 seconds hold Seated Shoulder Flexion Towel Slide at Table Top - 1-2 x daily - 10 reps - 3-5 seconds hold Seated Shoulder External Rotation with Dowel - 1-2 x daily - 10 reps - 3-5 seconds hold Shoulder Flexion Wall Slide with Towel - 1-2 x daily - 10 reps - 3-5 seconds hold Standing Shoulder Abduction AAROM with Dowel - 1-2 x daily - 10 reps - 3-5 seconds hold Standing Row with Anchored Resistance - 1-2 x daily - 2 sets - 10 reps Scapular Retraction with Resistance Advanced - 1-2 x daily - 2 sets - 10 reps

## 2019-10-16 NOTE — Progress Notes (Signed)
Virtual Visit via Telephone Note  I connected with [name] on 5/4/21at  2:30 PM EDT by telephone and verified that I am speaking with the correct person using two identifiers.   I discussed the limitations, risks, security and privacy concerns of performing an evaluation and management service by telephone and the availability of in person appointments. I also discussed with the patient that there may be a patient responsible charge related to this service. The patient expressed understanding and agreed to proceed.  Location patient: home Location provider: work office Participants present for the call: patient, provider Patient did not have a visit in the prior 7 days to address this/these issue(s).   History of Present Illness: [name] is a 54 yo male with Hx of alcoholism,hepatic encephalopathy,HIV,depression,anxietuy,and cirrhosis who needs a FL-2 completed. He is with his cousin's husband. He provides more of hx today. He thinks he needs to be admitted to inpatient for "complete care" and hoping I can have him admitted. He also mentions that he is trying to apply for affordable housing but there is a long waiting list.  He is homeless, staying in a motel for now. He is estranged from family.   At this time he is not using a cane or walker but his cousin's husband he needs to do so. He has had 5 falls in the past month. When asked [name] about ADL's, he states that he doe snot need help with bathing,dressing,or grooming. He does not have a drivers license. He was in a "rest home" and he left.  He also left the shelter and refuses to go back.   Intermittent episodes of confusion, daily.  Also he is in "a lot of pain", has not been able to establish with pain management. According to his cousin's husband, he also went to social service but he did not get much help.  Observations/Objective: Patient sounds cheerful and well on the phone. I do not appreciate any  SOB. Speech grossly intact. Patient reported vitals:N/A  Assessment and Plan:  1. Frequent falls Fall precautions discussed. Offered outpt PT but cousin's husband does not think it is possible at this time due to housing conditions.  2. Moderate episode of recurrent major depressive disorder ([place]) A list of psychiatrists will be posted in pt instructions, recommend arranging appt.   3. Alcoholic cirrhosis of liver with ascites ([place]) Strongly recommend avoiding alcohol intake. Hx of hepatic encephalopathy, which could be contributing factors for episodes of confusion. Instructed about warning signs.  4. Other problems related to housing and economic circumstances Explained that it is difficult to determine level of care needed through a phone call. Based on information, independent or assisted living are appropriate.  I will be compliting FL-2 and when he knows name of facility he is going to apply to let me know.  Follow Up Instructions:  No follow-ups on file.  I did not refer this patient for an OV in the next 24 hours for this/these issue(s).  I discussed the assessment and treatment plan with the patient. Carl. Maunu and his cousin's husband were provided an opportunity to ask questions and all were answered. they agreed with the plan and demonstrated an understanding of the instructions.    I provided 40 minutes of non-face-to-face time during this encounter.   Carl Yera Martinique, MD

## 2019-10-16 NOTE — Telephone Encounter (Signed)
Patient walked into office today with his brother in law.  They are confused on what to do for follow up  related to Asencion's pain , it seems he was routed to an outpatient facility and he is requesting in patient.  He would like to go back to the facility he was at previous

## 2019-10-16 NOTE — Patient Instructions (Signed)
PSYCHIATRIC OFFICES AND PSYCHIATRISTS IN THE AREA  When psychiatric evaluation is discussed or recommended referral is not necessary. This is a list of options around [place], [place] that you can call and arrange an appointment.  -[facility] [phone] [place] [phone] [facility] [phone] -[facility] [phone]  Dr Hardie Shackleton [phone] Dr Alexander Bergeron, MD [phone] Dr Chucky May, MD [phone])   Dr Allayne Butcher. Marcelino Freestone, MD [phone] Dr Geralyn [facility]  Dr Sheralyn Boatman, MD [phone])  Dr Fredonia Highland, MD [phone]  Dr Luz Lex [phone]

## 2019-10-16 NOTE — Telephone Encounter (Signed)
Patient's cousin, [name] is calling for assistance because she does not know what else to do.  [name] was discharged from rehab. center and is still having severe pain.  He is now homeless.  His cousin [name] states he has a FL-2 form completed but they are not able to find a facility.  She is very concerned and is not aware of resources available.  [name] is living in [place]. I will check with [facility] for resources.    The family is working to get him help but it seems they are confused about which type of rehab. He needs.  This situation is complicated and I will need to speak with the patient directly.   [name], cousin phone - [phone]  Carl Patter, RN
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
## 2019-10-18 ENCOUNTER — Telehealth: Payer: Self-pay | Admitting: Family Medicine

## 2019-10-18 NOTE — Telephone Encounter (Signed)
[name] returned phone call. Advised [name] during original call that Dr. Martinique does not work on Thursday.

## 2019-10-18 NOTE — Telephone Encounter (Signed)
Pt cousin Porfirio Oar called to find out what diagnosis is listed on FL2 form and if short or long term care is listed on FL2 form.  She is also needing assistance with find a facility to take him in due to being homeless. Most are saying he is too young, if it is short term care they will not take him with no where to go after.   [phone]

## 2019-10-18 NOTE — Telephone Encounter (Signed)
Left message informing [name] that Dr. Martinique and CMA are not in on Thursday.

## 2019-10-19 NOTE — Telephone Encounter (Signed)
Returned call to [facility] and recommendations per Dr. Martinique. [name] verbalized understanding. [name] stated that she had tried 3 counties and don't know what else to do because he cannot afford to stay in the hotel and he will be homeless really soon.

## 2019-10-19 NOTE — Telephone Encounter (Signed)
Please advise 

## 2019-10-19 NOTE — Telephone Encounter (Signed)
In regard to assistance with finding a facility, we discussed this and I recommended contacting social services for this purpose.  He can also find out about facilities around his area and fill an application.  FL-2 is being completed. I am writing Dx's listed in his medical records. Thanks, BJ

## 2019-10-24 ENCOUNTER — Other Ambulatory Visit: Payer: Self-pay | Admitting: Internal Medicine

## 2019-10-24 ENCOUNTER — Other Ambulatory Visit: Payer: Self-pay | Admitting: Infectious Disease

## 2019-10-25 NOTE — Telephone Encounter (Signed)
Please advise [name]? 

## 2019-10-25 NOTE — Telephone Encounter (Signed)
[name] needs an appointment with an APP or me to get refills

## 2019-10-25 NOTE — Telephone Encounter (Signed)
I  left him a detailed message that he needs an appointment so we can refill his medicine.

## 2019-10-25 NOTE — Telephone Encounter (Signed)
Spoke with [place] and he is going to call me right back, he was unable to talk.

## 2019-10-31 ENCOUNTER — Telehealth: Payer: Self-pay

## 2019-10-31 NOTE — Telephone Encounter (Signed)
OK to refill for 2 mos total I did see labs from 5/18 in Care everywhere so we can refill this  If he wants me to fill his meds any more though he needs to be seen - if not by me perhaps he can find another care provider to help with Rxs until he can get to see me

## 2019-10-31 NOTE — Telephone Encounter (Signed)
I spoke with Epimenio Sarin # [phone] with [facility] and told her we were refilling Yonis's amiloride and she said she will go pick it up for him later today. She will tell him that he needs an appointment with Dr Carlean Purl.

## 2019-10-31 NOTE — Telephone Encounter (Signed)
I called the home # in his chart and that is his cousin Aimee Fields #. She said Ancel is now homeless. He is being helped by Epimenio Sarin [phone] with [facility]. I spoke with [name] and she said he is living at the [facility] in [place] since last Thursday. Their # is [phone]. With this new information [name] may we refill his rx and get him to come in later?

## 2019-11-01 NOTE — Telephone Encounter (Signed)
I spoke with [name] , she went to the pharmacy and they told her it was too early to pick up [name]'s amiloride. She will call the shelter and make sure he has enough. They told her on June 3rd it could be picked up.

## 2019-11-01 NOTE — Telephone Encounter (Signed)
Debbie called asking to speak with you about pt. Pls call her at [phone]. Opt 3.

## 2019-11-02 ENCOUNTER — Other Ambulatory Visit: Payer: Self-pay | Admitting: Internal Medicine

## 2019-11-02 ENCOUNTER — Other Ambulatory Visit: Payer: Self-pay | Admitting: Family Medicine

## 2019-11-02 NOTE — Telephone Encounter (Signed)
Please advise [name]? He is living in the shelter in [facility] so I know this is not the current drug store. If okay to refill I can confirm pharmacy on Monday with [name] that is helping him.

## 2019-11-02 NOTE — Telephone Encounter (Signed)
1 month  Needs appt

## 2019-11-05 ENCOUNTER — Telehealth: Payer: Self-pay | Admitting: Family Medicine

## 2019-11-05 NOTE — Telephone Encounter (Signed)
I left Debbie a message to call me. I need to confirm pharmacy.

## 2019-11-05 NOTE — Telephone Encounter (Signed)
Pt states he knows that his PCP did not prescribe these medications b7ut he is out and needing a refill   Medication Refill: [place] Amitriptyline   Pharmacy: [facility] [address] FAX: [phone]

## 2019-11-05 NOTE — Telephone Encounter (Signed)
I spoke to [name] and she will go by and pick it up today at the [facility] at [facility] and [address]. I called the [facility] and they said it has already been transferred.

## 2019-11-05 NOTE — Telephone Encounter (Signed)
Message Routed to PCP for review and approval. 

## 2019-11-05 NOTE — Telephone Encounter (Signed)
[name] is returning your call

## 2019-11-06 ENCOUNTER — Other Ambulatory Visit: Payer: Self-pay | Admitting: Family Medicine

## 2019-11-06 DIAGNOSIS — G9009 Other idiopathic peripheral autonomic neuropathy: Secondary | ICD-10-CM

## 2019-11-06 MED ORDER — AMITRIPTYLINE HCL 100 MG PO TABS: ORAL_TABLET | ORAL | 0 refills | Status: DC

## 2019-11-06 NOTE — Telephone Encounter (Signed)
Please explain Mr Lano that [name] is a med prescribed by ID. Amitriptyline has been prescribed by his GI, I assume that it is to treat neuropathy, so prescription sent. Thanks, BJ

## 2019-11-06 NOTE — Telephone Encounter (Signed)
I called her back and we got the drug store questions answered.

## 2019-11-06 NOTE — Telephone Encounter (Signed)
Debbie returned your call.

## 2019-11-07 NOTE — Telephone Encounter (Signed)
Left detailed message for patient to return call to office. 

## 2019-11-08 ENCOUNTER — Telehealth: Payer: Self-pay | Admitting: Internal Medicine

## 2019-11-08 NOTE — Telephone Encounter (Signed)
Tried to call pt but no answer. Called other number and someone answered the phone and advised that pt was in a homeless shelter and he does not have a phone rn.

## 2019-11-08 NOTE — Telephone Encounter (Signed)
Advised that if pt calls her to have him call us back. Person verbalized understanding.

## 2019-11-08 NOTE — Telephone Encounter (Signed)
I tried to call him back and his voice mail is full and not accepting messages. I will try again later.

## 2019-11-09 NOTE — Telephone Encounter (Signed)
I tried to reach [name] and it goes to voicemail which is full and not accepting messages.

## 2019-11-13 ENCOUNTER — Other Ambulatory Visit: Payer: Self-pay | Admitting: Internal Medicine

## 2019-11-13 NOTE — Telephone Encounter (Signed)
Noted  

## 2019-11-13 NOTE — Telephone Encounter (Signed)
I spoke with [name] [phone] ,option #3) [facility] . She is going to check on this medicine. I don't see it on his list currently. She is going to check with his cousin and call me back.

## 2019-11-15 ENCOUNTER — Telehealth: Payer: Self-pay | Admitting: Family Medicine

## 2019-11-15 NOTE — Telephone Encounter (Signed)
Pt stated he has noticed that since the beginning of this year he has been very short winded. He stated that after an hr walk yesterday he would have to stop every few feet to catch his breathe.  He also complains about not being able to sleep at night and wanted to know if she would prescribe him anything for it.   Informed pt that Dr. Martinique is out on vacation until next week and he denied setting an appt    Pt can be reached at [phone] ext [phone] Or Aimee Fields (ok per [facility]) [phone]

## 2019-11-16 ENCOUNTER — Telehealth: Payer: Self-pay

## 2019-11-16 DIAGNOSIS — R188 Other ascites: Secondary | ICD-10-CM

## 2019-11-16 DIAGNOSIS — K7682 Hepatic encephalopathy: Secondary | ICD-10-CM

## 2019-11-16 NOTE — Telephone Encounter (Signed)
Debbie called me back and said that Levoy called this refill request in himself. But it needs to go to another drugstore not in [place] per [place] because she picks up his medicine.
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
## 2019-11-16 NOTE — Telephone Encounter (Signed)
I left Debbie a message to call me back.

## 2019-11-19 ENCOUNTER — Telehealth: Payer: Self-pay | Admitting: Family Medicine

## 2019-11-19 ENCOUNTER — Emergency Department (HOSPITAL_BASED_OUTPATIENT_CLINIC_OR_DEPARTMENT_OTHER)
Admission: EM | Admit: 2019-11-19 | Discharge: 2019-11-19 | Disposition: A | Discharge status: Home / Self Care | Payer: Medicaid Other | Attending: Emergency Medicine | Admitting: Emergency Medicine

## 2019-11-19 ENCOUNTER — Encounter (HOSPITAL_BASED_OUTPATIENT_CLINIC_OR_DEPARTMENT_OTHER): Payer: Self-pay | Admitting: *Deleted

## 2019-11-19 ENCOUNTER — Emergency Department (HOSPITAL_BASED_OUTPATIENT_CLINIC_OR_DEPARTMENT_OTHER): Payer: Medicaid Other

## 2019-11-19 ENCOUNTER — Other Ambulatory Visit: Payer: Self-pay

## 2019-11-19 DIAGNOSIS — E7220 Disorder of urea cycle metabolism, unspecified: Secondary | ICD-10-CM | POA: Diagnosis not present

## 2019-11-19 DIAGNOSIS — Z87891 Personal history of nicotine dependence: Secondary | ICD-10-CM | POA: Diagnosis not present

## 2019-11-19 DIAGNOSIS — K769 Liver disease, unspecified: Secondary | ICD-10-CM | POA: Insufficient documentation

## 2019-11-19 DIAGNOSIS — Z880 Allergy status to penicillin: Secondary | ICD-10-CM | POA: Insufficient documentation

## 2019-11-19 DIAGNOSIS — G4700 Insomnia, unspecified: Secondary | ICD-10-CM | POA: Diagnosis not present

## 2019-11-19 DIAGNOSIS — B20 Human immunodeficiency virus [HIV] disease: Secondary | ICD-10-CM | POA: Diagnosis not present

## 2019-11-19 DIAGNOSIS — R0602 Shortness of breath: Secondary | ICD-10-CM | POA: Insufficient documentation

## 2019-11-19 DIAGNOSIS — F419 Anxiety disorder, unspecified: Secondary | ICD-10-CM | POA: Diagnosis not present

## 2019-11-19 DIAGNOSIS — M25512 Pain in left shoulder: Secondary | ICD-10-CM | POA: Insufficient documentation

## 2019-11-19 LAB — CBC WITH DIFFERENTIAL/PLATELET
Abs Immature Granulocytes: 0.02 10*3/uL (ref 0.00–0.07)
Basophils Absolute: 0 10*3/uL (ref 0.0–0.1)
Basophils Relative: 1 %
Eosinophils Absolute: 0.1 10*3/uL (ref 0.0–0.5)
Eosinophils Relative: 3 %
HCT: 44 % (ref 39.0–52.0)
Hemoglobin: 14.2 g/dL (ref 13.0–17.0)
Immature Granulocytes: 0 %
Lymphocytes Relative: 31 %
Lymphs Abs: 1.4 10*3/uL (ref 0.7–4.0)
MCH: 27.6 pg (ref 26.0–34.0)
MCHC: 32.3 g/dL (ref 30.0–36.0)
MCV: 85.4 fL (ref 80.0–100.0)
Monocytes Absolute: 0.5 10*3/uL (ref 0.1–1.0)
Monocytes Relative: 11 %
Neutro Abs: 2.5 10*3/uL (ref 1.7–7.7)
Neutrophils Relative %: 54 %
Platelets: 102 10*3/uL — ABNORMAL LOW (ref 150–400)
RBC: 5.15 MIL/uL (ref 4.22–5.81)
RDW: 16.5 % — ABNORMAL HIGH (ref 11.5–15.5)
Smear Review: DECREASED
WBC: 4.6 10*3/uL (ref 4.0–10.5)
nRBC: 0 % (ref 0.0–0.2)

## 2019-11-19 LAB — COMPREHENSIVE METABOLIC PANEL
ALT: 26 U/L (ref 0–44)
AST: 31 U/L (ref 15–41)
Albumin: 4.1 g/dL (ref 3.5–5.0)
Alkaline Phosphatase: 104 U/L (ref 38–126)
Anion gap: 11 (ref 5–15)
BUN: 14 mg/dL (ref 6–20)
CO2: 21 mmol/L — ABNORMAL LOW (ref 22–32)
Calcium: 8.9 mg/dL (ref 8.9–10.3)
Chloride: 106 mmol/L (ref 98–111)
Creatinine, Ser: 1.12 mg/dL (ref 0.61–1.24)
GFR calc Af Amer: >60 mL/min (ref >60)
GFR calc non Af Amer: >60 mL/min (ref >60)
Glucose, Bld: 96 mg/dL (ref 70–99)
Potassium: 3.8 mmol/L (ref 3.5–5.1)
Sodium: 138 mmol/L (ref 135–145)
Total Bilirubin: 1 mg/dL (ref 0.3–1.2)
Total Protein: 7.4 g/dL (ref 6.5–8.1)

## 2019-11-19 LAB — BRAIN NATRIURETIC PEPTIDE: B Natriuretic Peptide: 27.7 pg/mL (ref 0.0–100.0)

## 2019-11-19 LAB — AMMONIA: Ammonia: 150 umol/L — ABNORMAL HIGH (ref 9–35)

## 2019-11-19 MED ORDER — HYDROXYZINE HCL 25 MG PO TABS: 25.0000 mg | ORAL_TABLET | Freq: Three times a day (TID) | ORAL | 0 refills | Status: DC | PRN

## 2019-11-19 NOTE — Telephone Encounter (Signed)
Refill x 2 mos

## 2019-11-19 NOTE — ED Triage Notes (Addendum)
Pt c/o anxiety and insomnia , also c/o SOB with exertion

## 2019-11-19 NOTE — ED Triage Notes (Signed)
Per EMS:  Pt from [facility], pt having anxiety and insomnia.  Pt has recent family member death.  Pt also c/o left shoulder pain.
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
## 2019-11-19 NOTE — Telephone Encounter (Signed)
Sig please [name], thank you.

## 2019-11-19 NOTE — Telephone Encounter (Signed)
I transferred pt to Nurse Triage because he stated he had shortness of breath and didn't feel good with his breathing.

## 2019-11-20 ENCOUNTER — Telehealth: Payer: Self-pay

## 2019-11-20 MED ORDER — LACTULOSE 10 GM/15ML PO SOLN: 20.0000 g | Freq: Three times a day (TID) | ORAL | 1 refills | Status: DC

## 2019-11-20 NOTE — Telephone Encounter (Signed)
[name] with [facility] notified and she will go get it tomorrow for him.

## 2019-11-20 NOTE — Telephone Encounter (Signed)
Tried calling patient, unable to reach. Spoke with Aimee, she stated that she would inform him if he contacted her.

## 2019-11-20 NOTE — Telephone Encounter (Signed)
20 g tid # 1 month supply w/ 1 refill

## 2019-11-20 NOTE — Telephone Encounter (Signed)
I have spoken to [place] with [facility] and she has updated me on his situation.

## 2019-11-20 NOTE — Telephone Encounter (Signed)
Patient called office today to inform MD he has been living at a men's shelter in [facility]. Is not sure when he will be able to schedule a appointment with the office, but would like for MD to know he is trying to get back on track. Is dealing with a lot right now and is trying to move back into [place] at a living facility.  [place]

## 2019-11-21 ENCOUNTER — Other Ambulatory Visit: Payer: Self-pay | Admitting: Family Medicine

## 2019-11-21 ENCOUNTER — Telehealth: Payer: Self-pay | Admitting: Family Medicine

## 2019-11-21 DIAGNOSIS — G9009 Other idiopathic peripheral autonomic neuropathy: Secondary | ICD-10-CM

## 2019-11-21 MED ORDER — AMITRIPTYLINE HCL 100 MG PO TABS: ORAL_TABLET | ORAL | 1 refills | Status: DC

## 2019-11-21 NOTE — Telephone Encounter (Signed)
I think we have spoken to [facility] about him as well. Maybe can see if Mitch or som3eon at [facility] has some ideas how to hlep [place]

## 2019-11-21 NOTE — Telephone Encounter (Signed)
Patient returned call to office on 11/21/2019 and stated that:   He is struggling with life, his brother just got killed in a car wreck.   He seen a Dr. In [facility] Monday or Tuesday for SOB and he's not diabetic and his BP is good.   He doesn't have a ride to the office and they are supposed to be putting him in an assisted living for rehabilitation for his shoulder.Nothing further needed at this time.

## 2019-11-21 NOTE — Telephone Encounter (Signed)
Patient called because he needs a refill on his amitriptyline (ELAVIL) 100 MG tablet  Sent to:  [place], [place] AT [place] Phone:  [phone]  Fax:  [phone]       He said that he just had them filled in May but when they moved him from [facility] to a different shelter that they didn't pack his Rx.  He is struggling with life, his brother just got killed in a car wreck.  He seen a Dr. In [facility] Monday or Tuesday for SOB and he's not diabetic and his BP is good.  He doesn't have a ride to the office and they are supposed to be putting him in an assisted living for rehabilitation for his shoulder.

## 2019-11-21 NOTE — Telephone Encounter (Signed)
Please advise 

## 2019-11-21 NOTE — Telephone Encounter (Signed)
Patient returned call to office and stated that:   He is struggling with life, his brother just got killed in a car wreck.   He seen a Dr. In [facility] Monday or Tuesday for SOB and he's not diabetic and his BP is good.   He doesn't have a ride to the office and they are supposed to be putting him in an assisted living for rehabilitation for his shoulder.  Nothing further needed at this time.

## 2019-11-21 NOTE — Telephone Encounter (Signed)
I sent 3 months supply on 11/06/19. I really do not feel comfortable prescribing this type of medication, I am concerned about following instructions, taking more than prescribed,or misplacing meds among some. I am sending 30 days supply at the time. Because Hx psychiatric disorders, he needs to establish with psychiatrist. Next follow up in office. Thanks, BJ

## 2019-11-22 NOTE — Telephone Encounter (Signed)
Patient notified of update  and verbalized understanding. 

## 2019-11-29 ENCOUNTER — Telehealth: Payer: Self-pay | Admitting: Family Medicine

## 2019-11-29 NOTE — Telephone Encounter (Signed)
Pt call and stated that the  amitriptyline (ELAVIL) 100 MG tablet make it worse and want Gabapentin call in.

## 2019-11-30 ENCOUNTER — Other Ambulatory Visit: Payer: Self-pay | Admitting: Infectious Disease

## 2019-11-30 ENCOUNTER — Telehealth: Payer: Self-pay

## 2019-11-30 MED ORDER — GABAPENTIN 300 MG PO CAPS: 600.0000 mg | ORAL_CAPSULE | Freq: Every day | ORAL | 3 refills | Status: DC

## 2019-11-30 NOTE — Telephone Encounter (Signed)
Patient called office today requesting prescription for Gabapentin. States last night he was in too much pain that he could not sleep. Would like prescription sent in before this weekend so he could see his father on Father's day.  Patient has contacted his PCP for prescription, but is not in office until Monday. Advised patient to continue contacting PCP office since we do not prescribe anything for pain. Patient states if he cannot get prescription from MD he would ask someone at the shelter for some.  Encouraged patient NOT to take somebody else's medication. Advised he ask PCP office schedule him with different provider if MD was not in office today. Patient will contact office again. [place]

## 2019-11-30 NOTE — Telephone Encounter (Signed)
Please advise 

## 2019-11-30 NOTE — Telephone Encounter (Signed)
I sent rx for gabapentin to pharmacy in [place]. Not sure where he is right now

## 2019-11-30 NOTE — Telephone Encounter (Signed)
Patient notified and stated that he would have to find someone to bring him and will call office to schedule.

## 2019-11-30 NOTE — Telephone Encounter (Signed)
He needs f/u appt in office. Thanks, BJ

## 2019-12-06 ENCOUNTER — Telehealth: Payer: Self-pay | Admitting: Internal Medicine

## 2019-12-06 NOTE — Telephone Encounter (Signed)
Pls call pt, he stated that he will be going to court soon so needs some documentation from Dr. Carlean Purl.

## 2019-12-07 NOTE — Telephone Encounter (Signed)
Called patient twice, busy tone each time. Unable to lm.

## 2019-12-10 ENCOUNTER — Emergency Department (HOSPITAL_COMMUNITY): Payer: Medicaid Other

## 2019-12-10 ENCOUNTER — Other Ambulatory Visit: Payer: Self-pay

## 2019-12-10 ENCOUNTER — Encounter (HOSPITAL_COMMUNITY): Payer: Self-pay | Admitting: Emergency Medicine

## 2019-12-10 ENCOUNTER — Emergency Department (HOSPITAL_COMMUNITY)
Admission: EM | Admit: 2019-12-10 | Discharge: 2019-12-10 | Disposition: A | Discharge status: Home / Self Care | Payer: Medicaid Other | Attending: Emergency Medicine | Admitting: Emergency Medicine

## 2019-12-10 DIAGNOSIS — R188 Other ascites: Secondary | ICD-10-CM

## 2019-12-10 DIAGNOSIS — Y9289 Other specified places as the place of occurrence of the external cause: Secondary | ICD-10-CM | POA: Insufficient documentation

## 2019-12-10 DIAGNOSIS — R2681 Unsteadiness on feet: Secondary | ICD-10-CM

## 2019-12-10 DIAGNOSIS — Y999 Unspecified external cause status: Secondary | ICD-10-CM | POA: Insufficient documentation

## 2019-12-10 DIAGNOSIS — S0990XA Unspecified injury of head, initial encounter: Secondary | ICD-10-CM | POA: Diagnosis not present

## 2019-12-10 DIAGNOSIS — B20 Human immunodeficiency virus [HIV] disease: Secondary | ICD-10-CM | POA: Insufficient documentation

## 2019-12-10 DIAGNOSIS — I10 Essential (primary) hypertension: Secondary | ICD-10-CM | POA: Diagnosis not present

## 2019-12-10 DIAGNOSIS — K7030 Alcoholic cirrhosis of liver without ascites: Secondary | ICD-10-CM | POA: Insufficient documentation

## 2019-12-10 DIAGNOSIS — Z5900 Homelessness unspecified: Secondary | ICD-10-CM

## 2019-12-10 DIAGNOSIS — Y9389 Activity, other specified: Secondary | ICD-10-CM | POA: Insufficient documentation

## 2019-12-10 DIAGNOSIS — W010XXA Fall on same level from slipping, tripping and stumbling without subsequent striking against object, initial encounter: Secondary | ICD-10-CM | POA: Insufficient documentation

## 2019-12-10 DIAGNOSIS — E7220 Disorder of urea cycle metabolism, unspecified: Secondary | ICD-10-CM | POA: Insufficient documentation

## 2019-12-10 DIAGNOSIS — F1020 Alcohol dependence, uncomplicated: Secondary | ICD-10-CM | POA: Diagnosis not present

## 2019-12-10 DIAGNOSIS — R2689 Other abnormalities of gait and mobility: Secondary | ICD-10-CM | POA: Diagnosis not present

## 2019-12-10 DIAGNOSIS — K7682 Hepatic encephalopathy: Secondary | ICD-10-CM

## 2019-12-10 DIAGNOSIS — R296 Repeated falls: Secondary | ICD-10-CM | POA: Insufficient documentation

## 2019-12-10 LAB — COMPREHENSIVE METABOLIC PANEL
ALT: 19 U/L (ref 0–44)
AST: 24 U/L (ref 15–41)
Albumin: 3.4 g/dL — ABNORMAL LOW (ref 3.5–5.0)
Alkaline Phosphatase: 76 U/L (ref 38–126)
Anion gap: 12 (ref 5–15)
BUN: 11 mg/dL (ref 6–20)
CO2: 21 mmol/L — ABNORMAL LOW (ref 22–32)
Calcium: 9.3 mg/dL (ref 8.9–10.3)
Chloride: 110 mmol/L (ref 98–111)
Creatinine, Ser: 1.23 mg/dL (ref 0.61–1.24)
GFR calc Af Amer: >60 mL/min (ref >60)
GFR calc non Af Amer: >60 mL/min (ref >60)
Glucose, Bld: 103 mg/dL — ABNORMAL HIGH (ref 70–99)
Potassium: 3.6 mmol/L (ref 3.5–5.1)
Sodium: 143 mmol/L (ref 135–145)
Total Bilirubin: 1.2 mg/dL (ref 0.3–1.2)
Total Protein: 6 g/dL — ABNORMAL LOW (ref 6.5–8.1)

## 2019-12-10 LAB — CBC WITH DIFFERENTIAL/PLATELET
Abs Immature Granulocytes: 0.01 10*3/uL (ref 0.00–0.07)
Basophils Absolute: 0 10*3/uL (ref 0.0–0.1)
Basophils Relative: 1 %
Eosinophils Absolute: 0.1 10*3/uL (ref 0.0–0.5)
Eosinophils Relative: 3 %
HCT: 39 % (ref 39.0–52.0)
Hemoglobin: 12.2 g/dL — ABNORMAL LOW (ref 13.0–17.0)
Immature Granulocytes: 0 %
Lymphocytes Relative: 25 %
Lymphs Abs: 0.7 10*3/uL (ref 0.7–4.0)
MCH: 27.3 pg (ref 26.0–34.0)
MCHC: 31.3 g/dL (ref 30.0–36.0)
MCV: 87.2 fL (ref 80.0–100.0)
Monocytes Absolute: 0.2 10*3/uL (ref 0.1–1.0)
Monocytes Relative: 8 %
Neutro Abs: 1.8 10*3/uL (ref 1.7–7.7)
Neutrophils Relative %: 63 %
Platelets: 84 10*3/uL — ABNORMAL LOW (ref 150–400)
RBC: 4.47 MIL/uL (ref 4.22–5.81)
RDW: 16.8 % — ABNORMAL HIGH (ref 11.5–15.5)
WBC: 2.8 10*3/uL — ABNORMAL LOW (ref 4.0–10.5)
nRBC: 0 % (ref 0.0–0.2)

## 2019-12-10 LAB — URINALYSIS, ROUTINE W REFLEX MICROSCOPIC
Bilirubin Urine: NEGATIVE
Glucose, UA: NEGATIVE mg/dL
Hgb urine dipstick: NEGATIVE
Ketones, ur: NEGATIVE mg/dL
Leukocytes,Ua: NEGATIVE
Nitrite: NEGATIVE
Protein, ur: NEGATIVE mg/dL
Specific Gravity, Urine: 1.01 (ref 1.005–1.030)
pH: 7 (ref 5.0–8.0)

## 2019-12-10 LAB — AMMONIA: Ammonia: 175 umol/L — ABNORMAL HIGH (ref 9–35)

## 2019-12-10 MED ORDER — LACTULOSE 10 GM/15ML PO SOLN: 20.0000 g | Freq: Three times a day (TID) | ORAL | 0 refills | Status: AC

## 2019-12-10 NOTE — Evaluation (Signed)
Physical Therapy Evaluation Patient Details Name: [name] MRN: [id number] DOB: 02/17/1966 Today's Date: 12/10/2019   History of Present Illness  Pt is a 54 y/o male admitted secondary to fall and increased tremors. PMH includes anxiety, alcohol abuse, HTN, and HIV.   Clinical Impression  Pt admitted secondary to problem above with deficits below. Pt requiring max A to come to long sitting on stretcher in ED. Pt with full body tremors upon coming to sitting. Unable to perform further mobility safely with +1 assist. Feel pt will benefit from SNF level therapies at d/c. Will continue to follow acutely to maximize functional mobility independence and safety.     Follow Up Recommendations SNF    Equipment Recommendations  Wheelchair (measurements PT);Wheelchair cushion (measurements PT)    Recommendations for Other Services       Precautions / Restrictions Precautions Precautions: Fall Precaution Comments: Has had multiple falls  Restrictions Weight Bearing Restrictions: No      Mobility  Bed Mobility Overal bed mobility: Needs Assistance Bed Mobility: Supine to Sit     Supine to sit: Max assist     General bed mobility comments: Max A to come into long sitting on stretcher. Pt used BUE to pull up on rails. Pt with full body tremors upon sitting up.   Transfers                 General transfer comment: unsafe to attempt with +1  Ambulation/Gait                Stairs            Wheelchair Mobility    Modified Rankin (Stroke Patients Only)       Balance Overall balance assessment: Needs assistance Sitting-balance support: Bilateral upper extremity supported Sitting balance-Leahy Scale: Poor Sitting balance - Comments: Required BUE and external support to maintain long sitting.                                      Pertinent Vitals/Pain Pain Assessment: Faces Faces Pain Scale: Hurts little more Pain Location:  generalized Pain Descriptors / Indicators: Guarding Pain Intervention(s): Limited activity within patient's tolerance;Monitored during session;Repositioned    Home Living Family/patient expects to be discharged to:: Unsure                 Additional Comments: Per pt he has been staying at [facility] for the last five weeks, but per notes, pt from homeless shelter.      Prior Function Level of Independence: Independent with assistive device(s)         Comments: reports he normally ambulates with RW.      Hand Dominance        Extremity/Trunk Assessment   Upper Extremity Assessment Upper Extremity Assessment: RUE deficits/detail;LUE deficits/detail RUE Deficits / Details: tremors noted with AROM throughout BUE  LUE Deficits / Details: Limited ROM at baseline at shoulder. Increased tremors noted with AROM.     Lower Extremity Assessment Lower Extremity Assessment: RLE deficits/detail;LLE deficits/detail RLE Deficits / Details: Able to perform SLR, however, pt with increased tremors.  LLE Deficits / Details: Able to perform SLR, however, pt with increased tremors.        Communication   Communication: No difficulties  Cognition Arousal/Alertness: Awake/alert Behavior During Therapy: Flat affect Overall Cognitive Status: No family/caregiver present to determine baseline cognitive functioning  General Comments: Pt reporting conflicting information about home information. Somewhat slow to respond at times.       General Comments      Exercises     Assessment/Plan    PT Assessment Patient needs continued PT services  PT Problem List Decreased strength;Decreased balance;Decreased mobility;Decreased activity tolerance;Decreased knowledge of use of DME;Decreased knowledge of precautions;Decreased cognition;Decreased safety awareness       PT Treatment Interventions DME instruction;Gait training;Functional  mobility training;Therapeutic exercise;Therapeutic activities;Balance training;Patient/family education;Cognitive remediation    PT Goals (Current goals can be found in the Care Plan section)  Acute Rehab PT Goals Patient Stated Goal: for tremors to stop PT Goal Formulation: With patient Time For Goal Achievement: 12/24/19 Potential to Achieve Goals: Good    Frequency Min 3X/week   Barriers to discharge Decreased caregiver support;Inaccessible home environment      Co-evaluation               AM-PAC PT "6 Clicks" Mobility  Outcome Measure Help needed turning from your back to your side while in a flat bed without using bedrails?: A Lot Help needed moving from lying on your back to sitting on the side of a flat bed without using bedrails?: Total Help needed moving to and from a bed to a chair (including a wheelchair)?: Total Help needed standing up from a chair using your arms (e.g., wheelchair or bedside chair)?: Total Help needed to walk in hospital room?: Total Help needed climbing 3-5 steps with a railing? : Total 6 Click Score: 7    End of Session   Activity Tolerance: Patient tolerated treatment well Patient left: in bed;with call bell/phone within reach (on stretcher in ED ) Nurse Communication: Mobility status PT Visit Diagnosis: Muscle weakness (generalized) (M62.81);History of falling (Z91.81);Difficulty in walking, not elsewhere classified (R26.2)    Time: 7048-8891 PT Time Calculation (min) (ACUTE ONLY): 11 min   Charges:   PT Evaluation $PT Eval Moderate Complexity: 1 Mod          Reuel Derby, PT, DPT  Acute Rehabilitation Services  Pager: [phone] Office: [phone]   Rudean Hitt 12/10/2019, 1:29 PM

## 2019-12-10 NOTE — ED Triage Notes (Addendum)
To ED via [facility] from homeless shelter in [facility]) after having a ground level fall- no injuries- EMS was called to assist pt to wheelchair. Pt having tremors - in legs and arms for 2 weeks-- seen at [facility] recently for same.  Does use walker and wheelchair. C/o legs sore- was able to walk all day yesterday without walker in [facility]
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
## 2019-12-10 NOTE — Progress Notes (Addendum)
3:10pm: CSW returned to patient's room to determine if he had made a decision regarding his discharge plan. Patient states he wants to return to the homeless shelter at discharge. Patient reports he spoke with his cousin Barbaraann Rondo who will continue checking on him at the shelter.  CSW encouraged patient to follow up with his PCP for placement if he desires placement in the future. CSW encouraged patient to utilize his income for the betterment of himself, including his health. No further questions or concerns to address.  Mariann Laster, RN CM will obtain a ride via Lyft for the patient once he is ready to return to [facility] in [place].  2:15pm: CSW received consult for patient for possible placement. CSW met with patient at bedside to complete discussion. Patient reports he receives Medicaid with a supplemental check of $700 monthly. Patient reports he has been homeless for several years, stating he was living in a hotel but decided to move out because it was so expensive. Patient reports spending all of his income monthly on miscellanous items. Patient reports he receives food stamps. Patient reports he has no children and has never been married. Patient reports his cousins Barbaraann Rondo and Amy are his only support people.  CSW explained PT recommendations for SNF placement. CSW and patient discussed barriers to placement including lack of stable discharge plan from facility, Medicaid benefits, and his physical size. Patient reports he was able to ambulate independently all over [facility] yesterday but after the fall he had this morning, he is increasingly weak. Patient requests time to think about decision for placement or returning back to the shelter.   CSW to return to patient's room at 3pm to determine what decision he has made.  Madilyn Fireman, MSW, LCSW-A Transitions of Care  Clinical Social Worker  [facility] Emergency Departments  Medical ICU [phone]

## 2019-12-10 NOTE — Discharge Instructions (Addendum)
You were seen in the ED for a fall.  Lab work showed elevated ammonia level which is similar to previous.  Please resume taking lactulose.  I have refilled this prescription  Physical therapy recommended skilled nursing facility placement however given your current living and financial situation placement into a skilled nursing facility is difficult.  Case management nurse spoke to you and you declined skilled nursing placement.  Call and schedule a follow up appointment with primary care doctor in the next 72 hours for further assistance   Use your walker at all times for ambulation.

## 2019-12-11 NOTE — Telephone Encounter (Signed)
Attempted to return call again.  Phone rings twice then a busy signal.  Unable to reach the patient.  I will await a return call from him.  Patient will need to obtain his documents from the medical records department.

## 2019-12-19 ENCOUNTER — Emergency Department (HOSPITAL_COMMUNITY)
Admission: EM | Admit: 2019-12-19 | Discharge: 2019-12-19 | Disposition: A | Discharge status: Home / Self Care | Payer: Medicaid Other | Attending: Emergency Medicine | Admitting: Emergency Medicine

## 2019-12-19 ENCOUNTER — Other Ambulatory Visit: Payer: Self-pay

## 2019-12-19 DIAGNOSIS — Z21 Asymptomatic human immunodeficiency virus [HIV] infection status: Secondary | ICD-10-CM | POA: Insufficient documentation

## 2019-12-19 DIAGNOSIS — Z79899 Other long term (current) drug therapy: Secondary | ICD-10-CM | POA: Diagnosis not present

## 2019-12-19 DIAGNOSIS — Z85828 Personal history of other malignant neoplasm of skin: Secondary | ICD-10-CM | POA: Insufficient documentation

## 2019-12-19 DIAGNOSIS — E7220 Disorder of urea cycle metabolism, unspecified: Secondary | ICD-10-CM | POA: Diagnosis not present

## 2019-12-19 DIAGNOSIS — I10 Essential (primary) hypertension: Secondary | ICD-10-CM | POA: Diagnosis not present

## 2019-12-19 DIAGNOSIS — Z87891 Personal history of nicotine dependence: Secondary | ICD-10-CM | POA: Diagnosis not present

## 2019-12-19 DIAGNOSIS — R197 Diarrhea, unspecified: Secondary | ICD-10-CM | POA: Diagnosis present

## 2019-12-19 LAB — CBC
HCT: 47.7 % (ref 39.0–52.0)
Hemoglobin: 15.5 g/dL (ref 13.0–17.0)
MCH: 27.5 pg (ref 26.0–34.0)
MCHC: 32.5 g/dL (ref 30.0–36.0)
MCV: 84.7 fL (ref 80.0–100.0)
Platelets: 147 10*3/uL — ABNORMAL LOW (ref 150–400)
RBC: 5.63 MIL/uL (ref 4.22–5.81)
RDW: 16.4 % — ABNORMAL HIGH (ref 11.5–15.5)
WBC: 7 10*3/uL (ref 4.0–10.5)
nRBC: 0 % (ref 0.0–0.2)

## 2019-12-19 LAB — COMPREHENSIVE METABOLIC PANEL
ALT: 22 U/L (ref 0–44)
AST: 28 U/L (ref 15–41)
Albumin: 4.5 g/dL (ref 3.5–5.0)
Alkaline Phosphatase: 118 U/L (ref 38–126)
Anion gap: 15 (ref 5–15)
BUN: 22 mg/dL — ABNORMAL HIGH (ref 6–20)
CO2: 22 mmol/L (ref 22–32)
Calcium: 9.9 mg/dL (ref 8.9–10.3)
Chloride: 99 mmol/L (ref 98–111)
Creatinine, Ser: 1.54 mg/dL — ABNORMAL HIGH (ref 0.61–1.24)
GFR calc Af Amer: 58 mL/min — ABNORMAL LOW (ref >60)
GFR calc non Af Amer: 50 mL/min — ABNORMAL LOW (ref >60)
Glucose, Bld: 167 mg/dL — ABNORMAL HIGH (ref 70–99)
Potassium: 3.1 mmol/L — ABNORMAL LOW (ref 3.5–5.1)
Sodium: 136 mmol/L (ref 135–145)
Total Bilirubin: 2 mg/dL — ABNORMAL HIGH (ref 0.3–1.2)
Total Protein: 8 g/dL (ref 6.5–8.1)

## 2019-12-19 LAB — PROTIME-INR
INR: 1.2 (ref 0.8–1.2)
Prothrombin Time: 14.3 seconds (ref 11.4–15.2)

## 2019-12-19 LAB — AMMONIA: Ammonia: 213 umol/L — ABNORMAL HIGH (ref 9–35)

## 2019-12-19 NOTE — Progress Notes (Signed)
TOC CM spoke to pt at bedside. Pt states he does want to go back to [facility] in [place]. TOC CM contacted Debbie, with [facility]. And pt is cleared to go back to [facility]. Pt signed waiver for [facility] transport. Scanned to [email or web address]. Placed copy in pt's medical record. [place], [place] ED TOC CM [phone]
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
## 2019-12-19 NOTE — ED Triage Notes (Signed)
EMS reports coming from [facility], Director called out for diarrhea, x 2 days. Pt states unaware he is having Diarrhea. Hx or neuropathy.  BP 130/88 HR 80 RR 18 Sp02 97 RA

## 2019-12-20 ENCOUNTER — Telehealth: Payer: Self-pay | Admitting: *Deleted

## 2019-12-20 NOTE — Telephone Encounter (Signed)
Patient called the after hours line. Patient reports he needs advice on diet and nutrition issues

## 2019-12-21 NOTE — Telephone Encounter (Signed)
Tried contacting pt, get message that call cannot be completed at this time. Will try again.

## 2019-12-21 NOTE — Telephone Encounter (Signed)
I tried contacting patient, unable to leave a voicemail due to voicemail not being set up.

## 2019-12-24 ENCOUNTER — Telehealth: Payer: Self-pay | Admitting: Family Medicine

## 2019-12-24 ENCOUNTER — Encounter: Payer: Self-pay | Admitting: Family Medicine

## 2019-12-24 NOTE — Telephone Encounter (Signed)
FYI

## 2019-12-24 NOTE — Telephone Encounter (Signed)
Patient called to let Dr. Martinique know that he is having a living donor transplant done.

## 2019-12-29 ENCOUNTER — Other Ambulatory Visit: Payer: Self-pay

## 2019-12-29 ENCOUNTER — Emergency Department (HOSPITAL_BASED_OUTPATIENT_CLINIC_OR_DEPARTMENT_OTHER)
Admission: EM | Admit: 2019-12-29 | Discharge: 2019-12-30 | Disposition: A | Discharge status: Home / Self Care | Payer: Medicaid Other | Attending: Emergency Medicine | Admitting: Emergency Medicine

## 2019-12-29 ENCOUNTER — Encounter (HOSPITAL_BASED_OUTPATIENT_CLINIC_OR_DEPARTMENT_OTHER): Payer: Self-pay | Admitting: Emergency Medicine

## 2019-12-29 DIAGNOSIS — Z79899 Other long term (current) drug therapy: Secondary | ICD-10-CM | POA: Insufficient documentation

## 2019-12-29 DIAGNOSIS — R278 Other lack of coordination: Secondary | ICD-10-CM

## 2019-12-29 DIAGNOSIS — E7220 Disorder of urea cycle metabolism, unspecified: Secondary | ICD-10-CM | POA: Insufficient documentation

## 2019-12-29 DIAGNOSIS — Z87891 Personal history of nicotine dependence: Secondary | ICD-10-CM | POA: Insufficient documentation

## 2019-12-29 DIAGNOSIS — Z803 Family history of malignant neoplasm of breast: Secondary | ICD-10-CM | POA: Insufficient documentation

## 2019-12-29 DIAGNOSIS — C44729 Squamous cell carcinoma of skin of left lower limb, including hip: Secondary | ICD-10-CM | POA: Insufficient documentation

## 2019-12-29 DIAGNOSIS — R251 Tremor, unspecified: Secondary | ICD-10-CM | POA: Diagnosis present

## 2019-12-29 LAB — CBC WITH DIFFERENTIAL/PLATELET
Abs Immature Granulocytes: 0.02 10*3/uL (ref 0.00–0.07)
Basophils Absolute: 0 10*3/uL (ref 0.0–0.1)
Basophils Relative: 1 %
Eosinophils Absolute: 0.1 10*3/uL (ref 0.0–0.5)
Eosinophils Relative: 3 %
HCT: 42.3 % (ref 39.0–52.0)
Hemoglobin: 13.7 g/dL (ref 13.0–17.0)
Immature Granulocytes: 0 %
Lymphocytes Relative: 21 %
Lymphs Abs: 1.2 10*3/uL (ref 0.7–4.0)
MCH: 27.7 pg (ref 26.0–34.0)
MCHC: 32.4 g/dL (ref 30.0–36.0)
MCV: 85.6 fL (ref 80.0–100.0)
Monocytes Absolute: 0.4 10*3/uL (ref 0.1–1.0)
Monocytes Relative: 8 %
Neutro Abs: 3.6 10*3/uL (ref 1.7–7.7)
Neutrophils Relative %: 67 %
Platelets: 119 10*3/uL — ABNORMAL LOW (ref 150–400)
RBC: 4.94 MIL/uL (ref 4.22–5.81)
RDW: 16.3 % — ABNORMAL HIGH (ref 11.5–15.5)
WBC: 5.4 10*3/uL (ref 4.0–10.5)
nRBC: 0 % (ref 0.0–0.2)

## 2019-12-29 LAB — COMPREHENSIVE METABOLIC PANEL
ALT: 22 U/L (ref 0–44)
AST: 27 U/L (ref 15–41)
Albumin: 4.1 g/dL (ref 3.5–5.0)
Alkaline Phosphatase: 99 U/L (ref 38–126)
Anion gap: 13 (ref 5–15)
BUN: 18 mg/dL (ref 6–20)
CO2: 24 mmol/L (ref 22–32)
Calcium: 9.4 mg/dL (ref 8.9–10.3)
Chloride: 104 mmol/L (ref 98–111)
Creatinine, Ser: 1.23 mg/dL (ref 0.61–1.24)
GFR calc Af Amer: >60 mL/min (ref >60)
GFR calc non Af Amer: >60 mL/min (ref >60)
Glucose, Bld: 112 mg/dL — ABNORMAL HIGH (ref 70–99)
Potassium: 3.2 mmol/L — ABNORMAL LOW (ref 3.5–5.1)
Sodium: 141 mmol/L (ref 135–145)
Total Bilirubin: 1.3 mg/dL — ABNORMAL HIGH (ref 0.3–1.2)
Total Protein: 7.2 g/dL (ref 6.5–8.1)

## 2019-12-29 LAB — AMMONIA: Ammonia: 148 umol/L — ABNORMAL HIGH (ref 9–35)

## 2019-12-29 LAB — ETHANOL: Alcohol, Ethyl (B): <10 mg/dL (ref <10)

## 2019-12-29 MED ORDER — POTASSIUM CHLORIDE CRYS ER 20 MEQ PO TBCR
40.0000 meq | EXTENDED_RELEASE_TABLET | Freq: Once | ORAL | Status: AC
  Administered 2019-12-29: 40 meq via ORAL
  Filled 2019-12-29: qty 2

## 2019-12-29 NOTE — ED Triage Notes (Signed)
Pt states he has had hx of tremors but they have worsened since Tuesday. Pt with extreme difficulty moving from stretcher to chair. Pt also with increased stutter and dyspnea on exertion.
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
## 2019-12-29 NOTE — ED Notes (Signed)
Pt placed on cardiac monitor 

## 2019-12-29 NOTE — ED Notes (Signed)
Pt states hx of seizures with last seizure this am.

## 2019-12-30 ENCOUNTER — Inpatient Hospital Stay (HOSPITAL_COMMUNITY)
Admission: EM | Admit: 2019-12-30 | Discharge: 2020-01-07 | DRG: 433 | Disposition: A | Discharge status: Skilled Nursing Facility | Payer: Medicaid Other | Attending: Internal Medicine | Admitting: Internal Medicine

## 2019-12-30 DIAGNOSIS — F419 Anxiety disorder, unspecified: Secondary | ICD-10-CM | POA: Diagnosis present

## 2019-12-30 DIAGNOSIS — K7031 Alcoholic cirrhosis of liver with ascites: Secondary | ICD-10-CM | POA: Diagnosis present

## 2019-12-30 DIAGNOSIS — R531 Weakness: Secondary | ICD-10-CM

## 2019-12-30 DIAGNOSIS — N179 Acute kidney failure, unspecified: Secondary | ICD-10-CM

## 2019-12-30 DIAGNOSIS — K219 Gastro-esophageal reflux disease without esophagitis: Secondary | ICD-10-CM | POA: Diagnosis present

## 2019-12-30 DIAGNOSIS — Z20822 Contact with and (suspected) exposure to covid-19: Secondary | ICD-10-CM | POA: Diagnosis present

## 2019-12-30 DIAGNOSIS — G9340 Encephalopathy, unspecified: Secondary | ICD-10-CM | POA: Diagnosis present

## 2019-12-30 DIAGNOSIS — E785 Hyperlipidemia, unspecified: Secondary | ICD-10-CM | POA: Diagnosis present

## 2019-12-30 DIAGNOSIS — K7040 Alcoholic hepatic failure without coma: Principal | ICD-10-CM | POA: Diagnosis present

## 2019-12-30 DIAGNOSIS — R188 Other ascites: Secondary | ICD-10-CM

## 2019-12-30 DIAGNOSIS — Z9114 Patient's other noncompliance with medication regimen: Secondary | ICD-10-CM

## 2019-12-30 DIAGNOSIS — F1026 Alcohol dependence with alcohol-induced persisting amnestic disorder: Secondary | ICD-10-CM | POA: Diagnosis present

## 2019-12-30 DIAGNOSIS — Z85828 Personal history of other malignant neoplasm of skin: Secondary | ICD-10-CM

## 2019-12-30 DIAGNOSIS — Z915 Personal history of self-harm: Secondary | ICD-10-CM

## 2019-12-30 DIAGNOSIS — Z21 Asymptomatic human immunodeficiency virus [HIV] infection status: Secondary | ICD-10-CM | POA: Diagnosis present

## 2019-12-30 DIAGNOSIS — Z79899 Other long term (current) drug therapy: Secondary | ICD-10-CM

## 2019-12-30 DIAGNOSIS — Z6841 Body Mass Index (BMI) 40.0 and over, adult: Secondary | ICD-10-CM

## 2019-12-30 DIAGNOSIS — E512 Wernicke's encephalopathy: Secondary | ICD-10-CM | POA: Diagnosis present

## 2019-12-30 DIAGNOSIS — F329 Major depressive disorder, single episode, unspecified: Secondary | ICD-10-CM | POA: Diagnosis present

## 2019-12-30 DIAGNOSIS — Z87891 Personal history of nicotine dependence: Secondary | ICD-10-CM

## 2019-12-30 DIAGNOSIS — K5900 Constipation, unspecified: Secondary | ICD-10-CM | POA: Diagnosis present

## 2019-12-30 DIAGNOSIS — I10 Essential (primary) hypertension: Secondary | ICD-10-CM | POA: Diagnosis present

## 2019-12-30 DIAGNOSIS — D6959 Other secondary thrombocytopenia: Secondary | ICD-10-CM | POA: Diagnosis present

## 2019-12-30 DIAGNOSIS — K7460 Unspecified cirrhosis of liver: Secondary | ICD-10-CM

## 2019-12-30 DIAGNOSIS — Z590 Homelessness: Secondary | ICD-10-CM

## 2019-12-30 DIAGNOSIS — R059 Cough, unspecified: Secondary | ICD-10-CM

## 2019-12-30 DIAGNOSIS — Z91410 Personal history of adult physical and sexual abuse: Secondary | ICD-10-CM

## 2019-12-30 DIAGNOSIS — G629 Polyneuropathy, unspecified: Secondary | ICD-10-CM | POA: Diagnosis present

## 2019-12-30 MED ORDER — HYDROXYZINE HCL 25 MG PO TABS
25.0000 mg | ORAL_TABLET | Freq: Three times a day (TID) | ORAL | Status: DC | PRN
  Administered 2020-01-04 – 2020-01-06 (×2): 25 mg via ORAL
  Filled 2019-12-30 (×2): qty 1

## 2019-12-30 MED ORDER — LACTULOSE 10 GM/15ML PO SOLN
20.0000 g | Freq: Three times a day (TID) | ORAL | Status: DC
  Administered 2019-12-31 – 2020-01-02 (×5): 20 g via ORAL
  Filled 2019-12-30 (×4): qty 30

## 2019-12-30 MED ORDER — FUROSEMIDE 40 MG PO TABS
80.0000 mg | ORAL_TABLET | Freq: Every day | ORAL | Status: DC
  Administered 2020-01-01 – 2020-01-07 (×7): 80 mg via ORAL
  Filled 2019-12-30 (×7): qty 2

## 2019-12-30 MED ORDER — PANTOPRAZOLE SODIUM 40 MG PO TBEC
40.0000 mg | DELAYED_RELEASE_TABLET | Freq: Every day | ORAL | Status: DC
  Administered 2020-01-01 – 2020-01-07 (×7): 40 mg via ORAL
  Filled 2019-12-30 (×7): qty 1

## 2019-12-30 MED ORDER — EMTRICITABINE-TENOFOVIR AF 200-25 MG PO TABS
1.0000 | ORAL_TABLET | Freq: Every day | ORAL | Status: DC
  Administered 2020-01-01 – 2020-01-07 (×7): 1 via ORAL
  Filled 2019-12-30 (×9): qty 1

## 2019-12-30 MED ORDER — THIAMINE HCL 100 MG PO TABS
100.0000 mg | ORAL_TABLET | Freq: Every day | ORAL | Status: DC
  Administered 2020-01-01 – 2020-01-07 (×7): 100 mg via ORAL
  Filled 2019-12-30 (×8): qty 1

## 2019-12-30 MED ORDER — POTASSIUM CHLORIDE CRYS ER 20 MEQ PO TBCR
40.0000 meq | EXTENDED_RELEASE_TABLET | Freq: Every day | ORAL | Status: DC
  Administered 2020-01-01 – 2020-01-07 (×7): 40 meq via ORAL
  Filled 2019-12-30 (×7): qty 2

## 2019-12-30 MED ORDER — AMILORIDE HCL 5 MG PO TABS
15.0000 mg | ORAL_TABLET | Freq: Every day | ORAL | Status: DC
  Administered 2020-01-01 – 2020-01-07 (×7): 15 mg via ORAL
  Filled 2019-12-30 (×8): qty 3

## 2019-12-30 MED ORDER — FOLIC ACID 1 MG PO TABS
1.0000 mg | ORAL_TABLET | Freq: Every day | ORAL | Status: DC
  Administered 2020-01-01 – 2020-01-07 (×7): 1 mg via ORAL
  Filled 2019-12-30 (×7): qty 1

## 2019-12-30 MED ORDER — AMITRIPTYLINE HCL 100 MG PO TABS
100.0000 mg | ORAL_TABLET | Freq: Every day | ORAL | Status: DC
  Administered 2019-12-31 – 2020-01-06 (×8): 100 mg via ORAL
  Filled 2019-12-30: qty 4
  Filled 2019-12-30 (×3): qty 1
  Filled 2019-12-30 (×2): qty 4
  Filled 2019-12-30 (×2): qty 1

## 2019-12-30 MED ORDER — GABAPENTIN 300 MG PO CAPS
600.0000 mg | ORAL_CAPSULE | Freq: Every day | ORAL | Status: DC
  Administered 2019-12-31 – 2020-01-02 (×3): 600 mg via ORAL
  Filled 2019-12-30 (×3): qty 2

## 2019-12-30 NOTE — ED Triage Notes (Addendum)
Patient bib [facility], Patient coming from homeless shelter in [place], ems state patient was put out of homeless shelter, ems states they brought patient here for alternate placement and want to speak with CSW. - patient was evaluated in ED last night for other complaints. Ems states patient was put out of shelter because of patients immobility. Patient states shelter was working on getting him to [facility]. VS per EMS 132/68, 84 HR, 20 RR, 96% RA , 128CBG, 97.9 temp

## 2019-12-30 NOTE — Evaluation (Signed)
Physical Therapy Evaluation Patient Details Name: [name] MRN: [id number] DOB: 1966-03-13 Today's Date: 12/30/2019   History of Present Illness  Pt admitted from homeless shelter with inability to ambulate 2* weakness.  Pt with hx of HIV, ETOH abuse, and peripheral neuropathy  Clinical Impression  Pt admitted as above and presenting with functional mobility limitations 2* generalized weakness, significant balance deficits and limited endurance.  Pt would greatly benefit from follow up rehab at SNF level to maximize IND and safety.    Follow Up Recommendations SNF    Equipment Recommendations  Wheelchair (measurements PT)    Recommendations for Other Services OT consult     Precautions / Restrictions Precautions Precautions: Fall Precaution Comments: Has had multiple falls  Restrictions Weight Bearing Restrictions: No      Mobility  Bed Mobility               General bed mobility comments: Pt up in wc in triage  Transfers Overall transfer level: Needs assistance Equipment used: Rolling walker (2 wheeled) Transfers: Sit to/from Stand Sit to Stand: Min assist;Mod assist;+2 physical assistance;+2 safety/equipment         General transfer comment: cues for use of UEs to self assist;  physical assist to bring wt up and fwd and to balance in standing  Ambulation/Gait Ambulation/Gait assistance: Min assist;+2 physical assistance;+2 safety/equipment Gait Distance (Feet): 24 Feet Assistive device: Rolling walker (2 wheeled) Gait Pattern/deviations: Step-to pattern;Decreased step length - right;Decreased step length - left;Shuffle;Trunk flexed Gait velocity: decr   General Gait Details: cues for posture and position from RW; Noted intermittent buckling at knees but pt able to largely compensate with UEs on RW; physical assist for RW management, balance and support  Stairs            Wheelchair Mobility    Modified Rankin (Stroke Patients Only)        Balance Overall balance assessment: Needs assistance Sitting-balance support: No upper extremity supported Sitting balance-Leahy Scale: Fair     Standing balance support: Bilateral upper extremity supported Standing balance-Leahy Scale: Poor Standing balance comment: Pt reliant on UEs on RW for balance/support                             Pertinent Vitals/Pain Pain Assessment: No/denies pain    Home Living Family/patient expects to be discharged to:: Unsure                 Additional Comments: Pt brought from homeless shelter for alternate placement    Prior Function Level of Independence: Independent         Comments: Pt states he "a few weeks ago"  he could ambulate IND and does not use a RW.  On hospital admit last month, pt reported using RW     Hand Dominance   Dominant Hand: Right    Extremity/Trunk Assessment   Upper Extremity Assessment Upper Extremity Assessment: Generalized weakness;RUE deficits/detail;LUE deficits/detail RUE Deficits / Details: tremors noted with AROM throughout BUE  LUE Deficits / Details: Limited ROM at baseline at shoulder. Increased tremors noted with AROM.     Lower Extremity Assessment Lower Extremity Assessment: Generalized weakness;RLE deficits/detail;LLE deficits/detail RLE Deficits / Details: Able to perform LAQ, however, pt with increased tremors.  Quad strength 4/5 RLE Sensation: history of peripheral neuropathy RLE Coordination: decreased gross motor LLE Deficits / Details: Able to perform LAQ, however, pt with increased tremors. Quad strength 4-5 LLE Sensation: history  of peripheral neuropathy LLE Coordination: decreased gross motor       Communication   Communication: No difficulties  Cognition Arousal/Alertness: Awake/alert Behavior During Therapy: Flat affect Overall Cognitive Status: No family/caregiver present to determine baseline cognitive functioning                                         General Comments      Exercises     Assessment/Plan    PT Assessment Patient needs continued PT services  PT Problem List Decreased strength;Decreased balance;Decreased mobility;Decreased activity tolerance;Decreased knowledge of use of DME;Decreased knowledge of precautions;Decreased cognition;Decreased safety awareness       PT Treatment Interventions DME instruction;Gait training;Functional mobility training;Therapeutic exercise;Therapeutic activities;Balance training;Patient/family education;Cognitive remediation    PT Goals (Current goals can be found in the Care Plan section)  Acute Rehab PT Goals Patient Stated Goal: Be able to walk again PT Goal Formulation: With patient Time For Goal Achievement: 01/12/20 Potential to Achieve Goals: Fair    Frequency Min 2X/week   Barriers to discharge Decreased caregiver support;Inaccessible home environment      Co-evaluation               AM-PAC PT "6 Clicks" Mobility  Outcome Measure Help needed turning from your back to your side while in a flat bed without using bedrails?: A Lot Help needed moving from lying on your back to sitting on the side of a flat bed without using bedrails?: A Lot Help needed moving to and from a bed to a chair (including a wheelchair)?: A Lot Help needed standing up from a chair using your arms (e.g., wheelchair or bedside chair)?: A Lot Help needed to walk in hospital room?: A Lot Help needed climbing 3-5 steps with a railing? : Total 6 Click Score: 11    End of Session Equipment Utilized During Treatment: Gait belt Activity Tolerance: Patient limited by fatigue Patient left: in chair;with call bell/phone within reach Nurse Communication: Mobility status PT Visit Diagnosis: Muscle weakness (generalized) (M62.81);History of falling (Z91.81);Difficulty in walking, not elsewhere classified (R26.2)    Time: 3383-2919 PT Time Calculation (min) (ACUTE ONLY): 16  min   Charges:              Debe Coder PT [place] Pager [phone] Office [phone]   [place] 12/30/2019, 3:52 PM

## 2019-12-30 NOTE — ED Notes (Signed)
PT evaluated and will recommend SNF

## 2019-12-30 NOTE — Progress Notes (Signed)
CSW met with Pt to provide community resources. CSW spoke with Pt about [facility] nurse. Pt expressed appreciation. CSW added [place] to Pt's AVS.
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
## 2019-12-30 NOTE — Progress Notes (Signed)
Per Pt, Pt has full Medicaid. Pt is also willing to give up disability payment for placement in long-term care facility. CSW will begin [facility] placement process.   Should placement be unsuccessful, Pt states that he has a cousin and her husband, Amy and Gretel Acre, with whom he can stay "for a day or two" in case of emergency.

## 2019-12-30 NOTE — ED Notes (Signed)
Pt. Stood up with no assist to use the urinal. Nurse aware.
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
## 2019-12-30 NOTE — ED Notes (Signed)
RN confirmed with staff at [facility] that there would be someone at the building; Cabs were called multiple times to no avail; [facility] contacted for transport; Pt stable

## 2019-12-30 NOTE — ED Notes (Signed)
Spoke to [facility], who are unable to get him a ride until the morning but confirmed he has a room there; Tourist information centre manager a cab voucher.

## 2019-12-30 NOTE — ED Notes (Signed)
Contacted [facility] for transport 

## 2019-12-31 MED ORDER — DORAVIRINE 100 MG PO TABS
100.0000 mg | ORAL_TABLET | Freq: Every day | ORAL | Status: DC
  Administered 2020-01-01 – 2020-01-07 (×7): 100 mg via ORAL
  Filled 2019-12-31 (×7): qty 1

## 2019-12-31 NOTE — ED Notes (Signed)
All meds labeled "not given" due to patient taking his home medication he has with him.

## 2019-12-31 NOTE — Progress Notes (Addendum)
TOC CM contacted listed SNF for long placement  [facility]-left message/declined/no beds  [facility]- admissions coordinator checking to see if Medicaid is in network and review notes [facility] -left message [facility] message   [place], [place] ED TOC CM [phone]  12/31/2019 1800 TOC CM received call from [facility], [phone], states pt cannot return to shelter. His care exceeds what can be managed at a shelter. [place], [place] ED TOC CM [phone]

## 2020-01-01 LAB — SARS CORONAVIRUS 2 BY RT PCR (HOSPITAL ORDER, PERFORMED IN ~~LOC~~ HOSPITAL LAB): SARS Coronavirus 2: NEGATIVE

## 2020-01-01 NOTE — ED Notes (Signed)
Pt repositioned in bed, coke provided at pts request.

## 2020-01-01 NOTE — Social Work (Signed)
CSW has asked for Covid test on pt.  [facility] [phone] is obtaining authorization for pt.    CSW will continue to follow for dc needs.  Lenny Bouchillon Tarpley-Carter, MSW, [place] [facility] Transitions of Engineer, building services Health [phone]

## 2020-01-02 ENCOUNTER — Inpatient Hospital Stay (HOSPITAL_COMMUNITY): Payer: Medicaid Other

## 2020-01-02 ENCOUNTER — Encounter (HOSPITAL_COMMUNITY): Payer: Self-pay | Admitting: Internal Medicine

## 2020-01-02 DIAGNOSIS — Z79899 Other long term (current) drug therapy: Secondary | ICD-10-CM | POA: Diagnosis not present

## 2020-01-02 DIAGNOSIS — Z590 Homelessness: Secondary | ICD-10-CM

## 2020-01-02 DIAGNOSIS — K7031 Alcoholic cirrhosis of liver with ascites: Secondary | ICD-10-CM | POA: Diagnosis present

## 2020-01-02 DIAGNOSIS — G9340 Encephalopathy, unspecified: Secondary | ICD-10-CM

## 2020-01-02 DIAGNOSIS — F1026 Alcohol dependence with alcohol-induced persisting amnestic disorder: Secondary | ICD-10-CM | POA: Diagnosis present

## 2020-01-02 DIAGNOSIS — Z9114 Patient's other noncompliance with medication regimen: Secondary | ICD-10-CM | POA: Diagnosis not present

## 2020-01-02 DIAGNOSIS — K219 Gastro-esophageal reflux disease without esophagitis: Secondary | ICD-10-CM | POA: Diagnosis present

## 2020-01-02 DIAGNOSIS — Z20822 Contact with and (suspected) exposure to covid-19: Secondary | ICD-10-CM | POA: Diagnosis present

## 2020-01-02 DIAGNOSIS — K7030 Alcoholic cirrhosis of liver without ascites: Secondary | ICD-10-CM | POA: Diagnosis not present

## 2020-01-02 DIAGNOSIS — Z6841 Body Mass Index (BMI) 40.0 and over, adult: Secondary | ICD-10-CM | POA: Diagnosis not present

## 2020-01-02 DIAGNOSIS — F419 Anxiety disorder, unspecified: Secondary | ICD-10-CM | POA: Diagnosis present

## 2020-01-02 DIAGNOSIS — D6959 Other secondary thrombocytopenia: Secondary | ICD-10-CM | POA: Diagnosis present

## 2020-01-02 DIAGNOSIS — Z85828 Personal history of other malignant neoplasm of skin: Secondary | ICD-10-CM | POA: Diagnosis not present

## 2020-01-02 DIAGNOSIS — Z87891 Personal history of nicotine dependence: Secondary | ICD-10-CM | POA: Diagnosis not present

## 2020-01-02 DIAGNOSIS — Z915 Personal history of self-harm: Secondary | ICD-10-CM | POA: Diagnosis not present

## 2020-01-02 DIAGNOSIS — K5900 Constipation, unspecified: Secondary | ICD-10-CM | POA: Diagnosis present

## 2020-01-02 DIAGNOSIS — K7290 Hepatic failure, unspecified without coma: Secondary | ICD-10-CM | POA: Diagnosis not present

## 2020-01-02 DIAGNOSIS — Z91410 Personal history of adult physical and sexual abuse: Secondary | ICD-10-CM | POA: Diagnosis not present

## 2020-01-02 DIAGNOSIS — E785 Hyperlipidemia, unspecified: Secondary | ICD-10-CM | POA: Diagnosis present

## 2020-01-02 DIAGNOSIS — I10 Essential (primary) hypertension: Secondary | ICD-10-CM | POA: Diagnosis present

## 2020-01-02 DIAGNOSIS — G629 Polyneuropathy, unspecified: Secondary | ICD-10-CM | POA: Diagnosis present

## 2020-01-02 DIAGNOSIS — K7040 Alcoholic hepatic failure without coma: Secondary | ICD-10-CM | POA: Diagnosis present

## 2020-01-02 DIAGNOSIS — E512 Wernicke's encephalopathy: Secondary | ICD-10-CM | POA: Diagnosis present

## 2020-01-02 DIAGNOSIS — R531 Weakness: Secondary | ICD-10-CM | POA: Diagnosis present

## 2020-01-02 DIAGNOSIS — F329 Major depressive disorder, single episode, unspecified: Secondary | ICD-10-CM | POA: Diagnosis present

## 2020-01-02 DIAGNOSIS — Z21 Asymptomatic human immunodeficiency virus [HIV] infection status: Secondary | ICD-10-CM | POA: Diagnosis present

## 2020-01-02 DIAGNOSIS — N179 Acute kidney failure, unspecified: Secondary | ICD-10-CM | POA: Diagnosis present

## 2020-01-02 HISTORY — DX: Encephalopathy, unspecified: G93.40

## 2020-01-02 LAB — BILIRUBIN, FRACTIONATED(TOT/DIR/INDIR)
Bilirubin, Direct: 0.3 mg/dL — ABNORMAL HIGH (ref 0.0–0.2)
Indirect Bilirubin: 1.2 mg/dL — ABNORMAL HIGH (ref 0.3–0.9)
Total Bilirubin: 1.5 mg/dL — ABNORMAL HIGH (ref 0.3–1.2)

## 2020-01-02 LAB — COMPREHENSIVE METABOLIC PANEL
ALT: 21 U/L (ref 0–44)
AST: 24 U/L (ref 15–41)
Albumin: 3.9 g/dL (ref 3.5–5.0)
Alkaline Phosphatase: 101 U/L (ref 38–126)
Anion gap: 13 (ref 5–15)
BUN: 25 mg/dL — ABNORMAL HIGH (ref 6–20)
CO2: 27 mmol/L (ref 22–32)
Calcium: 9.6 mg/dL (ref 8.9–10.3)
Chloride: 103 mmol/L (ref 98–111)
Creatinine, Ser: 1.67 mg/dL — ABNORMAL HIGH (ref 0.61–1.24)
GFR calc Af Amer: 53 mL/min — ABNORMAL LOW (ref >60)
GFR calc non Af Amer: 45 mL/min — ABNORMAL LOW (ref >60)
Glucose, Bld: 167 mg/dL — ABNORMAL HIGH (ref 70–99)
Potassium: 3.8 mmol/L (ref 3.5–5.1)
Sodium: 143 mmol/L (ref 135–145)
Total Bilirubin: 1.4 mg/dL — ABNORMAL HIGH (ref 0.3–1.2)
Total Protein: 7.1 g/dL (ref 6.5–8.1)

## 2020-01-02 LAB — CBC WITH DIFFERENTIAL/PLATELET
Abs Immature Granulocytes: 0.01 10*3/uL (ref 0.00–0.07)
Basophils Absolute: 0 10*3/uL (ref 0.0–0.1)
Basophils Relative: 1 %
Eosinophils Absolute: 0.1 10*3/uL (ref 0.0–0.5)
Eosinophils Relative: 3 %
HCT: 45.7 % (ref 39.0–52.0)
Hemoglobin: 14.4 g/dL (ref 13.0–17.0)
Immature Granulocytes: 0 %
Lymphocytes Relative: 25 %
Lymphs Abs: 1 10*3/uL (ref 0.7–4.0)
MCH: 27.9 pg (ref 26.0–34.0)
MCHC: 31.5 g/dL (ref 30.0–36.0)
MCV: 88.4 fL (ref 80.0–100.0)
Monocytes Absolute: 0.3 10*3/uL (ref 0.1–1.0)
Monocytes Relative: 7 %
Neutro Abs: 2.6 10*3/uL (ref 1.7–7.7)
Neutrophils Relative %: 64 %
Platelets: UNDETERMINED 10*3/uL (ref 150–400)
RBC: 5.17 MIL/uL (ref 4.22–5.81)
RDW: 16.8 % — ABNORMAL HIGH (ref 11.5–15.5)
WBC: 4 10*3/uL (ref 4.0–10.5)
nRBC: 0 % (ref 0.0–0.2)

## 2020-01-02 LAB — LIPASE, BLOOD: Lipase: 29 U/L (ref 11–51)

## 2020-01-02 LAB — AMMONIA: Ammonia: 136 umol/L — ABNORMAL HIGH (ref 9–35)

## 2020-01-02 LAB — PROTIME-INR
INR: 1.1 (ref 0.8–1.2)
Prothrombin Time: 14.1 seconds (ref 11.4–15.2)

## 2020-01-02 MED ORDER — RIFAXIMIN 550 MG PO TABS
550.0000 mg | ORAL_TABLET | Freq: Two times a day (BID) | ORAL | Status: DC
  Administered 2020-01-02 – 2020-01-07 (×10): 550 mg via ORAL
  Filled 2020-01-02 (×10): qty 1

## 2020-01-02 MED ORDER — ADULT MULTIVITAMIN W/MINERALS CH
1.0000 | ORAL_TABLET | Freq: Every day | ORAL | Status: DC
  Administered 2020-01-03 – 2020-01-07 (×5): 1 via ORAL
  Filled 2020-01-02 (×5): qty 1

## 2020-01-02 MED ORDER — LACTULOSE 10 GM/15ML PO SOLN
30.0000 g | Freq: Three times a day (TID) | ORAL | Status: DC
  Administered 2020-01-02 – 2020-01-03 (×2): 30 g via ORAL
  Filled 2020-01-02 (×2): qty 45

## 2020-01-02 MED ORDER — RIFAXIMIN 550 MG PO TABS
550.0000 mg | ORAL_TABLET | Freq: Three times a day (TID) | ORAL | Status: DC
  Filled 2020-01-02: qty 1

## 2020-01-02 MED ORDER — LACTULOSE ENEMA
300.0000 mL | Freq: Once | ORAL | Status: AC
  Administered 2020-01-02: 300 mL via RECTAL
  Filled 2020-01-02: qty 300

## 2020-01-02 NOTE — ED Notes (Signed)
Per Delo MD pt needs repeat labs; he will place orders.
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
## 2020-01-02 NOTE — ED Provider Notes (Signed)
I was asked to evaluate the patient regarding worsening mental status.  Patient is here awaiting placement at skilled nursing facility.  He has history of HIV disease as well as alcoholic cirrhosis.  He was found to have elevated ammonia level at outside facility.  Patient has no specific complaints other than generalized weakness and shakiness.  His vitals are stable, but on exam patient has asterixis.  I will obtain repeat laboratory studies as it has been 4 days since they were last checked.  Patient continues to run an elevated ammonia level.  According to physical therapy, he could walk 25 steps 2 days ago, however now is unable to stand.  He continues to exhibit asterixis and shakiness when he moves.  I highly suspect that this is hepatic encephalopathy.  I have discussed the care with the hospitalist who will admit.  I feel the patient requires medical work-up and care prior to transfer to SNF   Veryl Speak, MD 01/02/20 1315
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
## 2020-01-02 NOTE — ED Notes (Signed)
US at bedside

## 2020-01-02 NOTE — Progress Notes (Signed)
Physical Therapy Treatment Patient Details Name: [name] MRN: [id number] DOB: 1966-01-17 Today's Date: 01/02/2020    History of Present Illness Pt admitted from homeless shelter with inability to ambulate 2* weakness.  Pt with hx of HIV, ETOH abuse, and peripheral neuropathy    PT Comments    Pt is very cooperative but with noticed deterioration in strength and balance vs last visit.  Pt requiring increased assist for all tasks and unable to initiate step with either foot once assisted to standing.  At session end pt also noted to be confused on why he was here and why he was in the homeless shelter prior to this.  Pt also noted to be stuttering and having difficulty finding words at session end - RN present and witnessing same.   Follow Up Recommendations  SNF     Equipment Recommendations  Wheelchair (measurements PT)    Recommendations for Other Services OT consult     Precautions / Restrictions Precautions Precautions: Fall Precaution Comments: Has had multiple falls  Restrictions Weight Bearing Restrictions: No    Mobility  Bed Mobility Overal bed mobility: Needs Assistance Bed Mobility: Supine to Sit;Sit to Supine     Supine to sit: Max assist;+2 for physical assistance;+2 for safety/equipment Sit to supine: Mod assist;Min assist;+2 for physical assistance;+2 for safety/equipment   General bed mobility comments: Assist to manage LEs and to control trunk; increased assist to bring trunk up but pt reliant on gravity/momentum to return to bed  Transfers Overall transfer level: Needs assistance Equipment used: Rolling walker (2 wheeled) Transfers: Sit to/from Stand Sit to Stand: Mod assist;+2 physical assistance;+2 safety/equipment;From elevated surface         General transfer comment: cues for use of UEs to self assist;  physical assist to bring wt up and fwd and to balance in standing  Ambulation/Gait             General Gait Details: Pt stood at  bedside only with intermittent knee buckling.  Pt unable to initiate step with either LE   Stairs             Wheelchair Mobility    Modified Rankin (Stroke Patients Only)       Balance Overall balance assessment: Needs assistance Sitting-balance support: No upper extremity supported Sitting balance-Leahy Scale: Fair     Standing balance support: Bilateral upper extremity supported Standing balance-Leahy Scale: Poor Standing balance comment: Pt reliant on UEs on RW for balance/support                            Cognition Arousal/Alertness: Awake/alert Behavior During Therapy: Flat affect Overall Cognitive Status: No family/caregiver present to determine baseline cognitive functioning                                 General Comments: At session end pt confused on why he was here and then on why he was in the homeless shelter.      Exercises      General Comments        Pertinent Vitals/Pain Pain Assessment: No/denies pain    Home Living Family/patient expects to be discharged to:: Assisted living                    Prior Function            PT Goals (current goals  can now be found in the care plan section) Acute Rehab PT Goals Patient Stated Goal: Be able to walk again PT Goal Formulation: With patient Time For Goal Achievement: 01/12/20 Potential to Achieve Goals: Fair Progress towards PT goals: Not progressing toward goals - comment (Increased assist all task)    Frequency    Min 2X/week      PT Plan Current plan remains appropriate    Co-evaluation              AM-PAC PT "6 Clicks" Mobility   Outcome Measure  Help needed turning from your back to your side while in a flat bed without using bedrails?: A Lot Help needed moving from lying on your back to sitting on the side of a flat bed without using bedrails?: A Lot Help needed moving to and from a bed to a chair (including a wheelchair)?: A  Lot Help needed standing up from a chair using your arms (e.g., wheelchair or bedside chair)?: A Lot Help needed to walk in hospital room?: Total Help needed climbing 3-5 steps with a railing? : Total 6 Click Score: 10    End of Session Equipment Utilized During Treatment: Gait belt Activity Tolerance: Patient limited by fatigue Patient left: in bed;with call bell/phone within reach;with nursing/sitter in room Nurse Communication: Mobility status PT Visit Diagnosis: Muscle weakness (generalized) (M62.81);History of falling (Z91.81);Difficulty in walking, not elsewhere classified (R26.2)     Time: 2481-8590 PT Time Calculation (min) (ACUTE ONLY): 18 min  Charges:  $Therapeutic Activity: 8-22 mins                     [place] Pager [phone] Office [phone]    Allianna Beaubien 01/02/2020, 4:14 PM

## 2020-01-02 NOTE — Progress Notes (Signed)
TOC CM spoke to Howard, Admission Coordinator # [phone] to make aware pt will be admitted. Insurance Josem Kaufmann is still pending. Attempted call to pt's cousin, Amy per pt request. Left HIPAA compliant message for return call. [place], [place] ED TOC CM [phone]

## 2020-01-02 NOTE — ED Notes (Signed)
Patient provided a sierra mist. Pt states no other needs at this time.

## 2020-01-02 NOTE — Progress Notes (Addendum)
TOC CM spoke to pt at bedside and provided him with information on SNF that accepted. Pt is agreeable to [facility]. Requested CM call family, Amy # [phone]. TOC CM spoke to [employer], [employer]. States she is expecting Medicaid Auth today. Will notify TOC CM/CSW when complete for possible dc today. [place], [place] ED TOC CM [phone]
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
## 2020-01-03 ENCOUNTER — Inpatient Hospital Stay (HOSPITAL_COMMUNITY): Payer: Medicaid Other

## 2020-01-03 DIAGNOSIS — N179 Acute kidney failure, unspecified: Secondary | ICD-10-CM

## 2020-01-03 DIAGNOSIS — K7031 Alcoholic cirrhosis of liver with ascites: Secondary | ICD-10-CM

## 2020-01-03 DIAGNOSIS — K7030 Alcoholic cirrhosis of liver without ascites: Secondary | ICD-10-CM

## 2020-01-03 DIAGNOSIS — E512 Wernicke's encephalopathy: Secondary | ICD-10-CM

## 2020-01-03 LAB — CBC
HCT: 46.8 % (ref 39.0–52.0)
Hemoglobin: 14.9 g/dL (ref 13.0–17.0)
MCH: 28.4 pg (ref 26.0–34.0)
MCHC: 31.8 g/dL (ref 30.0–36.0)
MCV: 89.3 fL (ref 80.0–100.0)
Platelets: 99 10*3/uL — ABNORMAL LOW (ref 150–400)
RBC: 5.24 MIL/uL (ref 4.22–5.81)
RDW: 16.9 % — ABNORMAL HIGH (ref 11.5–15.5)
WBC: 4.4 10*3/uL (ref 4.0–10.5)
nRBC: 0 % (ref 0.0–0.2)

## 2020-01-03 LAB — URINALYSIS, ROUTINE W REFLEX MICROSCOPIC
Bilirubin Urine: NEGATIVE
Glucose, UA: NEGATIVE mg/dL
Hgb urine dipstick: NEGATIVE
Ketones, ur: NEGATIVE mg/dL
Leukocytes,Ua: NEGATIVE
Nitrite: NEGATIVE
Protein, ur: NEGATIVE mg/dL
Specific Gravity, Urine: 1.01 (ref 1.005–1.030)
pH: 7 (ref 5.0–8.0)

## 2020-01-03 LAB — COMPREHENSIVE METABOLIC PANEL
ALT: 21 U/L (ref 0–44)
AST: 23 U/L (ref 15–41)
Albumin: 3.9 g/dL (ref 3.5–5.0)
Alkaline Phosphatase: 101 U/L (ref 38–126)
Anion gap: 13 (ref 5–15)
BUN: 24 mg/dL — ABNORMAL HIGH (ref 6–20)
CO2: 25 mmol/L (ref 22–32)
Calcium: 9.7 mg/dL (ref 8.9–10.3)
Chloride: 107 mmol/L (ref 98–111)
Creatinine, Ser: 1.39 mg/dL — ABNORMAL HIGH (ref 0.61–1.24)
GFR calc Af Amer: >60 mL/min (ref >60)
GFR calc non Af Amer: 57 mL/min — ABNORMAL LOW (ref >60)
Glucose, Bld: 106 mg/dL — ABNORMAL HIGH (ref 70–99)
Potassium: 4.2 mmol/L (ref 3.5–5.1)
Sodium: 145 mmol/L (ref 135–145)
Total Bilirubin: 1.5 mg/dL — ABNORMAL HIGH (ref 0.3–1.2)
Total Protein: 7.1 g/dL (ref 6.5–8.1)

## 2020-01-03 LAB — AMMONIA: Ammonia: 95 umol/L — ABNORMAL HIGH (ref 9–35)

## 2020-01-03 MED ORDER — LACTULOSE 10 GM/15ML PO SOLN
30.0000 g | Freq: Four times a day (QID) | ORAL | Status: DC
  Administered 2020-01-03 – 2020-01-07 (×16): 30 g via ORAL
  Filled 2020-01-03 (×16): qty 45

## 2020-01-03 MED ORDER — ENOXAPARIN SODIUM 40 MG/0.4ML ~~LOC~~ SOLN
40.0000 mg | SUBCUTANEOUS | Status: DC
  Administered 2020-01-03 – 2020-01-07 (×5): 40 mg via SUBCUTANEOUS
  Filled 2020-01-03 (×5): qty 0.4

## 2020-01-03 NOTE — Progress Notes (Addendum)
Patient ID: [name], male   DOB: July 10, 1965, 54 y.o.   MRN: [id number]    Progress Note   Subjective   Day # 4  CC ; Hepatic encephalopathy   CXR - atelectasis  [place] for ascites - no ascites Complete Upper abd [place] - P  AFP - P, blood C-P   WBC  4.4, hgb 14.9, plts clumped  Creat 1.3ammonia down - 95  Patient recognized me from the door, and said hello.  He is having significant difficulty putting words together today, drowsy and with persistent significant asterixis-has been n.p.o. this morning for ultrasound so did not receive lactulose, he did have lactulose enema last night.     Objective   Vital signs in last 24 hours: Temp:  [97.8 F (36.6 C)-98.3 F (36.8 C)] 97.9 F (36.6 C) (07/22 0547) Pulse Rate:  [66-84] 66 (07/22 0547) Resp:  [16-20] 18 (07/22 0547) BP: (128-157)/(63-114) 135/65 (07/22 0547) SpO2:  [96 %-100 %] 99 % (07/22 0547) Weight:  [136.5 kg] 136.5 kg (07/22 0817)   General:    white male in nad, drowsy and was significant difficulty with word placement Heart:  Regular rate and rhythm; no murmurs Lungs: Respirations even and unlabored, lungs CTA bilaterally Abdomen:  Soft, obese, nontender and nondistended. Normal bowel sounds. Extremities:  Without edema. Neurologic:  Alert and orientedx3, significant asterixis Psych:  Cooperative. Normal mood and affect.  Intake/Output from previous day: 07/21 0701 - 07/22 0700 In: -  Out: 1900 [Urine:1900] Intake/Output this shift: No intake/output data recorded.  Lab Results: Recent Labs    01/02/20 1126 01/03/20 0752  WBC 4.0 4.4  HGB 14.4 14.9  HCT 45.7 46.8  PLT PLATELET CLUMPS NOTED ON SMEAR, UNABLE TO ESTIMATE PENDING   BMET Recent Labs    01/02/20 1126 01/03/20 0752  NA 143 145  K 3.8 4.2  CL 103 107  CO2 27 25  GLUCOSE 167* 106*  BUN 25* 24*  CREATININE 1.67* 1.39*  CALCIUM 9.6 9.7   LFT Recent Labs    01/02/20 1126 01/02/20 1126 01/03/20 0752  PROT 7.1   < > 7.1  ALBUMIN  3.9   < > 3.9  AST 24   < > 23  ALT 21   < > 21  ALKPHOS 101   < > 101  BILITOT 1.5*  1.4*   < > 1.5*  BILIDIR 0.3*  --   --   IBILI 1.2*  --   --    < > = values in this interval not displayed.   PT/INR Recent Labs    01/02/20 1126  LABPROT 14.1  INR 1.1    Studies/Results: DG CHEST PORT 1 VIEW  Result Date: 01/02/2020 CLINICAL DATA:  Inability to ambulate secondary to weakness EXAM: PORTABLE CHEST 1 VIEW COMPARISON:  Radiograph 11/19/2019 FINDINGS: There are chronically coarsened interstitial changes which are likely accentuated by some volume loss and atelectatic change. Features of mild vascular congestion are present as well with cephalized, indistinct pulmonary vascularity. Prominent cardiac silhouette may be accentuated by the portable technique and low volumes. Remaining cardiomediastinal contours are unremarkable. No acute osseous or soft tissue abnormality. IMPRESSION: Low volumes and atelectasis. Additional features may suggest mild interstitial edema superimposed on more chronic coarsened interstitial changes. Electronically Signed   By: Lovena Le M.D.   On: 01/02/2020 19:09   [place] ASCITES (ABDOMEN LIMITED)  Result Date: 01/02/2020 CLINICAL DATA:  Abdominal distension. EXAM: LIMITED ABDOMEN ULTRASOUND FOR ASCITES TECHNIQUE: Limited ultrasound survey  for ascites was performed in all four abdominal quadrants. COMPARISON:  None. FINDINGS: No ascites is seen in any quadrant of the abdomen. IMPRESSION: No ascites noted. Electronically Signed   By: Marijo Conception M.D.   On: 01/02/2020 14:53       Assessment / Plan:    Number one 54 year old white male with known EtOH induced cirrhosis, decompensated with history of chronic hepatic encephalopathy, esophageal varices, history of ascites and prior SBP remote who is admitted now with progressive hepatic encephalopathy  Infectious work-up thus far negative, no ascites on ultrasound. His ammonia level was 200 when he presented to  the emergency room 4 days ago.  I think he has been chronically encephalopathic in part due to inability to get medication etc. as he has been homeless.  Though his ammonia level is down today he has not had any improvement clinically.  #2 history of Wernicke's #3 chronic anxiety and depression #4 history of hyperlipidemia 5.  History of hypertension 6.  History of peripheral neuropathy  Plan; will increase lactulose to 30 g 4 times daily over the next day or 2 until we get his bowels moving well. Continue Xifaxan 550 twice daily which was started yesterday   Active Problems:   Encephalopathy     LOS: 1 day   Amy EsterwoodPA-C  01/03/2020, 8:32 AM    Attending physician's note   I have taken a history, examined the patient and reviewed the chart. I agree with the Advanced Practitioner's note, impression and recommendations.  54 year old with history of HIV, alcohol induced cirrhosis, Wernicke's, history of SBP in remote past [2012] decompensated with hepatic encephalopathy Complicated social situation.  He is homeless, med noncompliant [place] [address]  Admitted with worsening hepatic encephalopathy Infectious work-up negative so far UA negative, blood cultures no growth to date, no ascites on ultrasound AKI improving  Somnolent but arouses easily with appropriate conversation, oriented X 2 Asterixis++  Follow-up AFP and right upper quadrant ultrasound  Continue rifaximin 550 mg twice daily and lactulose with goal 3 bowel movements  GI will continue to follow along  Damaris Hippo , MD [phone]

## 2020-01-03 NOTE — Progress Notes (Signed)
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  Social worker has been involved in the ED for patient to be placed however became progressively more confused, he developed hepatic encephalopathy hospitalist were called for admission on 7/21.  GI was also consulted at that time.  Subjective / 24h Interval events: He seems to be quite slow on interview this morning.  Appears moderately confused, repetitive on questioning.  Denies any chest pain, denies any shortness of breath.  Assessment & Plan: Principal Problem Hepatic encephalopathy in the setting of underlying decompensated cirrhosis -Gastroenterology consulted, appreciate input.  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  It was believed that his worsening encephalopathy was due to constipation in the ED.  Currently he is on lactulose 30 g 3 times daily, I also believe he received lactulose enema yesterday.  He was also started on rifaximin.  Denies having a bowel movement yet but not sure how reliable he is since he did have an enema last night.  Blood work this morning is pending -Doppler ultrasound obtained by GI, pending this morning. -No ascites, he has grade 1 EV's but no bleeding noted.  Active Problems Acute kidney injury-creatinine 1.67.  Repeat value this morning pending.  If continues to remain elevated probably can hold Lasix for a day or 2  Generalized weakness-in the setting of advanced liver disease, physical therapy recommends SNF, social worker consulted  Homelessness-social worker consulted  HIV-continue home medications   Scheduled Meds: . aMILoride  15 mg Oral Daily  .  amitriptyline  100 mg Oral QHS  . doravirine  100 mg Oral Daily  . emtricitabine-tenofovir AF  1 tablet Oral Daily  . folic acid  1 mg Oral Daily  . furosemide  80 mg Oral Daily  . gabapentin  600 mg Oral QHS  . lactulose  30 g Oral TID  . multivitamin with minerals  1 tablet Oral Daily  . pantoprazole  40 mg Oral Daily  . potassium chloride SA  40 mEq Oral Daily  . rifaximin  550 mg Oral BID  . thiamine  100 mg Oral Daily   Continuous Infusions: PRN Meds:.hydrOXYzine  Diet Orders (From admission, onward)    Start     Ordered   01/02/20 1445  Diet 2 gram sodium Room service appropriate? Yes; Fluid consistency: Thin  Diet effective now       Question Answer Comment  Room service appropriate? Yes   Fluid consistency: Thin      01/02/20 1447          DVT prophylaxis:      Code Status: Prior  Family Communication: no family at bedside   Status is: Inpatient  Remains inpatient appropriate because:Inpatient level of care appropriate due to severity of illness   Dispo: The patient is from: homeless              Anticipated d/c is to: SNF              Anticipated d/c date is: 3 days              Patient currently is not medically stable to d/c.  Consultants:  GI  Procedures:  none  Microbiology  none  Antimicrobials: none    Objective: Vitals:   01/02/20 1330 01/02/20 2155 01/03/20 0221 01/03/20 0547  BP: (!) 156/83 (!) 143/63 (!) 142/67 135/65  Pulse: 71 72 69 66  Resp: 16 16 17 18   Temp:  97.8 F (36.6 C) 98.3 F (36.8 C) 97.9 F (36.6 C)  TempSrc:  Oral Oral Oral  SpO2: 100% 99% 96% 99%  Weight:      Height:        Intake/Output Summary (Last 24 hours) at 01/03/2020 0744 Last data filed at 01/02/2020 2230 Gross per 24 hour  Intake --  Output 1900 ml  Net -1900 ml   Filed Weights   12/30/19 0955  Weight: (!) 136.5 kg    Examination:  Constitutional: NAD Eyes: no scleral icterus ENMT: Mucous membranes are moist.  Neck: normal,  supple Respiratory: clear to auscultation bilaterally, no wheezing, no crackles.  Cardiovascular: Regular rate and rhythm, no murmurs / rubs / gallops. No LE edema.  Chronic venous stasis changes bilateral lower extremities Abdomen: non distended, no tenderness. Bowel sounds positive.  Musculoskeletal: no clubbing / cyanosis.  Skin: no rashes appreciated Neurologic: non focal   Data Reviewed: I have independently reviewed following labs and imaging studies   CBC: Recent Labs  Lab 12/29/19 2258 01/02/20 1126  WBC 5.4 4.0  NEUTROABS 3.6 2.6  HGB 13.7 14.4  HCT 42.3 45.7  MCV 85.6 88.4  PLT 119* PLATELET CLUMPS NOTED ON SMEAR, UNABLE TO ESTIMATE   Basic Metabolic Panel: Recent Labs  Lab 12/29/19 2258 01/02/20 1126  NA 141 143  K 3.2* 3.8  CL 104 103  CO2 24 27  GLUCOSE 112* 167*  BUN 18 25*  CREATININE 1.23 1.67*  CALCIUM 9.4 9.6   Liver Function Tests: Recent Labs  Lab 12/29/19 2258 01/02/20 1126  AST 27 24  ALT 22 21  ALKPHOS 99 101  BILITOT 1.3* 1.5*  1.4*  PROT 7.2 7.1  ALBUMIN 4.1 3.9   Coagulation Profile: Recent Labs  Lab 01/02/20 1126  INR 1.1   HbA1C: No results for input(s): HGBA1C in the last 72 hours. CBG: No results for input(s): GLUCAP in the last 168 hours.  Recent Results (from the past 240 hour(s))  SARS Coronavirus 2 by RT PCR (hospital order, performed in [facility] hospital lab) Nasopharyngeal Nasopharyngeal Swab     Status: None   Collection Time: 01/01/20 12:56 PM   Specimen: Nasopharyngeal Swab  Result Value Ref Range Status   SARS Coronavirus 2 NEGATIVE NEGATIVE Final    Comment: (NOTE) SARS-CoV-2 target nucleic acids are NOT DETECTED.  The SARS-CoV-2 RNA is generally detectable in upper and lower respiratory specimens during the acute phase of infection. The lowest concentration of SARS-CoV-2 viral copies this assay can detect is 250 copies / mL. A negative result does not preclude SARS-CoV-2 infection and should not be  used as the sole basis for treatment or other patient management decisions.  A negative result may occur with improper specimen collection / handling, submission of specimen other than nasopharyngeal swab, presence of viral mutation(s) within the areas targeted by this assay, and inadequate number of viral copies (<250 copies / mL). A negative result must be combined with clinical observations, patient history, and epidemiological information.  Fact Sheet for Patients:   StrictlyIdeas.no  Fact Sheet for Healthcare Providers: BankingDealers.co.za  This test is not yet approved or  cleared by the [place] FDA and has been  authorized for detection and/or diagnosis of SARS-CoV-2 by FDA under an Emergency Use Authorization (EUA).  This EUA will remain in effect (meaning this test can be used) for the duration of the COVID-19 declaration under Section 564(b)(1) of the Act, 21 U.S.C. section 360bbb-3(b)(1), unless the authorization is terminated or revoked sooner.  Performed at [facility], [place] [address], [place], [place] [address]      Radiology Studies: DG CHEST PORT 1 VIEW  Result Date: 01/02/2020 CLINICAL DATA:  Inability to ambulate secondary to weakness EXAM: PORTABLE CHEST 1 VIEW COMPARISON:  Radiograph 11/19/2019 FINDINGS: There are chronically coarsened interstitial changes which are likely accentuated by some volume loss and atelectatic change. Features of mild vascular congestion are present as well with cephalized, indistinct pulmonary vascularity. Prominent cardiac silhouette may be accentuated by the portable technique and low volumes. Remaining cardiomediastinal contours are unremarkable. No acute osseous or soft tissue abnormality. IMPRESSION: Low volumes and atelectasis. Additional features may suggest mild interstitial edema superimposed on more chronic coarsened interstitial changes. Electronically  Signed   By: Lovena Le M.D.   On: 01/02/2020 19:09   [place] ASCITES (ABDOMEN LIMITED)  Result Date: 01/02/2020 CLINICAL DATA:  Abdominal distension. EXAM: LIMITED ABDOMEN ULTRASOUND FOR ASCITES TECHNIQUE: Limited ultrasound survey for ascites was performed in all four abdominal quadrants. COMPARISON:  None. FINDINGS: No ascites is seen in any quadrant of the abdomen. IMPRESSION: No ascites noted. Electronically Signed   By: Marijo Conception M.D.   On: 01/02/2020 14:53    Marzetta Board, MD, PhD [facility]  Between 7 am - 7 pm I am available, please contact me via Amion or Securechat  Between 7 pm - 7 am I am not available, please contact night coverage MD/APP via Amion

## 2020-01-04 DIAGNOSIS — K7290 Hepatic failure, unspecified without coma: Secondary | ICD-10-CM

## 2020-01-04 LAB — COMPREHENSIVE METABOLIC PANEL
ALT: 22 U/L (ref 0–44)
AST: 24 U/L (ref 15–41)
Albumin: 3.7 g/dL (ref 3.5–5.0)
Alkaline Phosphatase: 96 U/L (ref 38–126)
Anion gap: 11 (ref 5–15)
BUN: 25 mg/dL — ABNORMAL HIGH (ref 6–20)
CO2: 24 mmol/L (ref 22–32)
Calcium: 9.3 mg/dL (ref 8.9–10.3)
Chloride: 107 mmol/L (ref 98–111)
Creatinine, Ser: 1.42 mg/dL — ABNORMAL HIGH (ref 0.61–1.24)
GFR calc Af Amer: >60 mL/min (ref >60)
GFR calc non Af Amer: 55 mL/min — ABNORMAL LOW (ref >60)
Glucose, Bld: 101 mg/dL — ABNORMAL HIGH (ref 70–99)
Potassium: 3.8 mmol/L (ref 3.5–5.1)
Sodium: 142 mmol/L (ref 135–145)
Total Bilirubin: 1.3 mg/dL — ABNORMAL HIGH (ref 0.3–1.2)
Total Protein: 6.8 g/dL (ref 6.5–8.1)

## 2020-01-04 LAB — PROTIME-INR
INR: 1.1 (ref 0.8–1.2)
Prothrombin Time: 14.1 seconds (ref 11.4–15.2)

## 2020-01-04 LAB — CBC
HCT: 45.7 % (ref 39.0–52.0)
Hemoglobin: 14.3 g/dL (ref 13.0–17.0)
MCH: 27.7 pg (ref 26.0–34.0)
MCHC: 31.3 g/dL (ref 30.0–36.0)
MCV: 88.6 fL (ref 80.0–100.0)
Platelets: 112 10*3/uL — ABNORMAL LOW (ref 150–400)
RBC: 5.16 MIL/uL (ref 4.22–5.81)
RDW: 16.9 % — ABNORMAL HIGH (ref 11.5–15.5)
WBC: 4.6 10*3/uL (ref 4.0–10.5)
nRBC: 0 % (ref 0.0–0.2)

## 2020-01-04 LAB — AFP TUMOR MARKER: AFP, Serum, Tumor Marker: 1.7 ng/mL (ref 0.0–8.3)

## 2020-01-04 LAB — MAGNESIUM: Magnesium: 2.2 mg/dL (ref 1.7–2.4)

## 2020-01-04 MED ORDER — LACTULOSE ENEMA
300.0000 mL | Freq: Once | ORAL | Status: DC
  Filled 2020-01-04: qty 300

## 2020-01-04 MED ORDER — GLYCERIN (LAXATIVE) 2.1 G RE SUPP
1.0000 | Freq: Once | RECTAL | Status: AC
  Administered 2020-01-04: 1 via RECTAL
  Filled 2020-01-04: qty 1

## 2020-01-04 NOTE — Progress Notes (Addendum)
Patient ID: [name], male   DOB: February 01, 1966, 54 y.o.   MRN: [id number]    Progress Note   Subjective   day # 5  CC ; hepatic encephalopathy  [place] ;Cirrhosis,no mass, no ascites  BUN 25/ creat 1.42 plts 112 hgb 14.3 INr 1.1  Patient more alert today, speech more clear and mentation better.  Per nursing he still has not had a bowel movement. Denies abdominal pain chest pain or dyspnea.   Complains of generalized fatigue and lethargy   Objective   Vital signs in last 24 hours: Temp:  [97.5 F (36.4 C)-98.4 F (36.9 C)] 97.7 F (36.5 C) (07/23 0635) Pulse Rate:  [65-75] 70 (07/23 0635) Resp:  [18] 18 (07/23 0635) BP: (134-139)/(73-84) 139/73 (07/23 0635) SpO2:  [97 %-99 %] 97 % (07/23 0635) Last BM Date: 12/31/19 General:    Older white male in NAD Heart:  Regular rate and rhythm; no murmurs Lungs: Respirations even and unlabored, lungs CTA bilaterally Abdomen:  Soft, morbidly obese no fluid wave, nontender nondistended. Normal bowel sounds. Extremities:  Without edema. Neurologic:  Alert and oriented, mentation improving still has asterixis but not as prominent Psych:  Cooperative. Normal mood and affect.  Intake/Output from previous day: 07/22 0701 - 07/23 0700 In: 560 [P.O.:560] Out: 2650 [Urine:2650] Intake/Output this shift: No intake/output data recorded.  Lab Results: Recent Labs    01/02/20 1126 01/03/20 0752 01/04/20 0449  WBC 4.0 4.4 4.6  HGB 14.4 14.9 14.3  HCT 45.7 46.8 45.7  PLT PLATELET CLUMPS NOTED ON SMEAR, UNABLE TO ESTIMATE 99* 112*   BMET Recent Labs    01/02/20 1126 01/03/20 0752 01/04/20 0449  NA 143 145 142  K 3.8 4.2 3.8  CL 103 107 107  CO2 27 25 24   GLUCOSE 167* 106* 101*  BUN 25* 24* 25*  CREATININE 1.67* 1.39* 1.42*  CALCIUM 9.6 9.7 9.3   LFT Recent Labs    01/02/20 1126 01/03/20 0752 01/04/20 0449  PROT 7.1   < > 6.8  ALBUMIN 3.9   < > 3.7  AST 24   < > 24  ALT 21   < > 22  ALKPHOS 101   < > 96  BILITOT  1.5*  1.4*   < > 1.3*  BILIDIR 0.3*  --   --   IBILI 1.2*  --   --    < > = values in this interval not displayed.   PT/INR Recent Labs    01/02/20 1126 01/04/20 0449  LABPROT 14.1 14.1  INR 1.1 1.1    Studies/Results: US Abdomen Complete  Result Date: 01/03/2020 CLINICAL DATA:  Cirrhosis EXAM: ABDOMEN ULTRASOUND COMPLETE COMPARISON:  CT 05/13/2019, sonogram 03/30/2018 FINDINGS: The examination is limited by the patient's body habitus and overlying bowel gas. Gallbladder: No gallstones or wall thickening visualized. No sonographic Murphy sign noted by sonographer. Common bile duct: The extrahepatic bile duct is not well visualized on this examination Liver: The liver parenchyma demonstrates diffuse coarsening of the echotexture and the contour is nodular in keeping with changes of cirrhosis. No focal intrahepatic masses identified. There is no intrahepatic biliary ductal dilation. The main portal vein is patent, though is of notably small caliber and demonstrates hepatofugal (reversed) flow. IVC: No abnormality visualized. Pancreas: Visualized portion unremarkable. Spleen: The spleen is mildly enlarged measuring 16.6 cm in greatest dimension. Right Kidney: Length: 11.5 cm. There is limited evaluation of the lower pole of the right kidney due to overlying bowel gas. Echogenicity  within normal limits. No mass or hydronephrosis visualized. Left Kidney: Length: 14.6 cm. Echogenicity within normal limits. No mass or hydronephrosis visualized. Abdominal aorta: No aneurysm visualized. Other findings: No ascites IMPRESSION: Technically limited examination with findings in keeping with cirrhosis. No focal intrahepatic mass identified. Small caliber portal vein with hepatofugal (reversed) flow. Mild splenomegaly, similar to prior examinations, in keeping with changes of portal venous hypertension. Electronically Signed   By: Fidela Salisbury MD   On: 01/03/2020 16:00   DG CHEST PORT 1 VIEW  Result Date:  01/02/2020 CLINICAL DATA:  Inability to ambulate secondary to weakness EXAM: PORTABLE CHEST 1 VIEW COMPARISON:  Radiograph 11/19/2019 FINDINGS: There are chronically coarsened interstitial changes which are likely accentuated by some volume loss and atelectatic change. Features of mild vascular congestion are present as well with cephalized, indistinct pulmonary vascularity. Prominent cardiac silhouette may be accentuated by the portable technique and low volumes. Remaining cardiomediastinal contours are unremarkable. No acute osseous or soft tissue abnormality. IMPRESSION: Low volumes and atelectasis. Additional features may suggest mild interstitial edema superimposed on more chronic coarsened interstitial changes. Electronically Signed   By: Lovena Le M.D.   On: 01/02/2020 19:09   [place] ASCITES (ABDOMEN LIMITED)  Result Date: 01/02/2020 CLINICAL DATA:  Abdominal distension. EXAM: LIMITED ABDOMEN ULTRASOUND FOR ASCITES TECHNIQUE: Limited ultrasound survey for ascites was performed in all four abdominal quadrants. COMPARISON:  None. FINDINGS: No ascites is seen in any quadrant of the abdomen. IMPRESSION: No ascites noted. Electronically Signed   By: Marijo Conception M.D.   On: 01/02/2020 14:53       Assessment / Plan:    #45 54 year old white male with known decompensated alcohol induced cirrhosis, HIV, history of chronic hepatic encephalopathy, esophageal varices and prior history of ascites with remote SBP who is admitted now with progressive hepatic encephalopathy. MELD  16 Infectious work-up negative, no ascites on ultrasound.  He has had significant improvement over the past 24 hours despite the fact that he still is not having bowel movements. I think he has been chronically encephalopathic, he has been homeless recently and unable to get his medications on a regular basis.  #2 Wernicke's #3 chronic anxiety and depression #4 peripheral neuropathy #5 hypertension  Plan; continue high-dose  lactulose 30 g 4 times daily over the next 1 to 2 days and once he starts having bowel movements may need to taper dose to at least 3 bowel movements per day. He is can have a suppository this morning, if that is ineffective we will order some tapwater enemas.  started Xifaxan this admission, plan to continue on discharge 550 twice daily He is stable on current dose of Lasix and potassium.  Anticipate he will be medically stable for transfer to SNF early next week GI will be available this weekend if needed, otherwise will reassess Monday  Active Problems:   Ascites   Wernicke's encephalopathy   Encephalopathy   Cirrhosis of liver ([place])   AKI (acute kidney injury) ([place])     LOS: 2 days   Amy EsterwoodPA-C  01/04/2020, 8:47 AM   I have discussed the case with the PA, and that is the plan I formulated. I personally interviewed and examined the patient.  His encephalopathy has been slow to improve, but seems to probably be turning the corner in the last day or so. High-dose lactulose has been given, little result yet.  I agree with rectal therapy to get his bowel movements going.  Abdomen exam somewhat limited due  to his morbid obesity, but it is soft, mildly distended with very active bowel sounds.  I suspect there will be activity soon.  No evidence GI bleeding or infection, no peripheral edema.  He has underlying end-stage liver disease, poor social situation and insight and is not a transplant candidate.  Unfortunately, his long-term prognosis is therefore quite poor.  We will follow.   25 minutes were spent on this encounter (including chart review, history/exam, counseling/coordination of care, and documentation)  Nelida Meuse III Office: [phone]

## 2020-01-04 NOTE — Progress Notes (Addendum)
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  Social worker has been involved in the ED for patient to be placed however became progressively more confused, he developed hepatic encephalopathy hospitalist were called for admission on 7/21.  GI was also consulted at that time.  Subjective / 24h Interval events: Speech seems to be better this morning, he appears more alert. Yesterday he could not form a sentence but today speaking more clear.  Denies any chest pain, no shortness of breath.  No abdominal pain, no nausea or vomiting.  He tells me he has not had a bowel movement yet.  Assessment & Plan: Principal Problem Hepatic encephalopathy in the setting of underlying decompensated cirrhosis -Gastroenterology consulted, appreciate input.  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  It was believed that his worsening encephalopathy was due to constipation in the ED.  Currently he is on lactulose 30 g 3 times daily, he also had a lactulose enema on admission.  Despite high-dose lactulose and rifaximin he has not had a bowel movement yet.  Will try suppository this morning at patient's request, if this does not work he will need enemas.  GI following, appreciate input.  He still has asterixis this morning  Active Problems Acute kidney injury-creatinine stable around 1.4.  May just be a new baseline  Generalized weakness-in the setting of advanced liver disease, physical therapy recommends SNF, social worker consulted, SNF placement pending improvement in his encephalopathy  Homelessness-social worker consulted  HIV-continue home  medications   Scheduled Meds: . aMILoride  15 mg Oral Daily  . amitriptyline  100 mg Oral QHS  . doravirine  100 mg Oral Daily  . emtricitabine-tenofovir AF  1 tablet Oral Daily  . enoxaparin (LOVENOX) injection  40 mg Subcutaneous Q24H  . folic acid  1 mg Oral Daily  . furosemide  80 mg Oral Daily  . Glycerin (Adult)  1 suppository Rectal Once  . lactulose  30 g Oral QID  . multivitamin with minerals  1 tablet Oral Daily  . pantoprazole  40 mg Oral Daily  . potassium chloride SA  40 mEq Oral Daily  . rifaximin  550 mg Oral BID  . thiamine  100 mg Oral Daily   Continuous Infusions: PRN Meds:.hydrOXYzine  Diet Orders (From admission, onward)    Start     Ordered   01/03/20 1210  Diet 2 gram sodium Room service appropriate? Yes; Fluid consistency: Thin  Diet effective now       Question Answer Comment  Room service appropriate? Yes   Fluid consistency: Thin      01/03/20 1209          DVT prophylaxis: enoxaparin (LOVENOX) injection 40 mg Start: 01/03/20 1000     Code Status: Full Code  Family Communication: no family at bedside   Status is: Inpatient  Remains inpatient appropriate because:Inpatient level of care appropriate due to severity of illness   Dispo: The patient is from: homeless              Anticipated d/c is to: SNF              Anticipated  d/c date is: 3 days              Patient currently is not medically stable to d/c.  Consultants:  GI  Procedures:  none  Microbiology  none  Antimicrobials: none    Objective: Vitals:   01/03/20 0933 01/03/20 1414 01/03/20 2203 01/04/20 0635  BP: 134/84 (!) 139/80 (!) 136/75 (!) 139/73  Pulse: 65 70 75 70  Resp: 18 18 18 18   Temp: (!) 97.5 F (36.4 C) 98 F (36.7 C) 98.4 F (36.9 C) 97.7 F (36.5 C)  TempSrc: Oral Oral Oral Oral  SpO2: 99% 98% 99% 97%  Weight:      Height:        Intake/Output Summary (Last 24 hours) at 01/04/2020 1034 Last data filed at 01/04/2020 1018 Gross per 24 hour   Intake 700 ml  Output 2750 ml  Net -2050 ml   Filed Weights   12/30/19 0955 01/03/20 0817  Weight: (!) 136.5 kg (!) 136.5 kg    Examination:  Constitutional: No distress, in bed. Eyes: No scleral icterus ENMT: Moist external drains Neck: normal, supple Respiratory: Lungs are clear, no wheezing, no crackles Cardiovascular: Regular rate and rhythm, no murmurs, no peripheral edema.  Chronic venous stasis changes bilateral lower extremities Abdomen: Soft, nontender, nondistended, bowel sounds positive Musculoskeletal: no clubbing / cyanosis.  Skin: no rashes appreciated Neurologic: No focal deficits, follows commands well, exhibits intentional tremor.  Positive for asterixis.  Data Reviewed: I have independently reviewed following labs and imaging studies   CBC: Recent Labs  Lab 12/29/19 2258 01/02/20 1126 01/03/20 0752 01/04/20 0449  WBC 5.4 4.0 4.4 4.6  NEUTROABS 3.6 2.6  --   --   HGB 13.7 14.4 14.9 14.3  HCT 42.3 45.7 46.8 45.7  MCV 85.6 88.4 89.3 88.6  PLT 119* PLATELET CLUMPS NOTED ON SMEAR, UNABLE TO ESTIMATE 99* 767*   Basic Metabolic Panel: Recent Labs  Lab 12/29/19 2258 01/02/20 1126 01/03/20 0752 01/04/20 0449  NA 141 143 145 142  K 3.2* 3.8 4.2 3.8  CL 104 103 107 107  CO2 24 27 25 24   GLUCOSE 112* 167* 106* 101*  BUN 18 25* 24* 25*  CREATININE 1.23 1.67* 1.39* 1.42*  CALCIUM 9.4 9.6 9.7 9.3  MG  --   --   --  2.2   Liver Function Tests: Recent Labs  Lab 12/29/19 2258 01/02/20 1126 01/03/20 0752 01/04/20 0449  AST 27 24 23 24   ALT 22 21 21 22   ALKPHOS 99 101 101 96  BILITOT 1.3* 1.5*  1.4* 1.5* 1.3*  PROT 7.2 7.1 7.1 6.8  ALBUMIN 4.1 3.9 3.9 3.7   Coagulation Profile: Recent Labs  Lab 01/02/20 1126 01/04/20 0449  INR 1.1 1.1   HbA1C: No results for input(s): HGBA1C in the last 72 hours. CBG: No results for input(s): GLUCAP in the last 168 hours.  Recent Results (from the past 240 hour(s))  SARS Coronavirus 2 by RT PCR  (hospital order, performed in [facility] hospital lab) Nasopharyngeal Nasopharyngeal Swab     Status: None   Collection Time: 01/01/20 12:56 PM   Specimen: Nasopharyngeal Swab  Result Value Ref Range Status   SARS Coronavirus 2 NEGATIVE NEGATIVE Final    Comment: (NOTE) SARS-CoV-2 target nucleic acids are NOT DETECTED.  The SARS-CoV-2 RNA is generally detectable in upper and lower respiratory specimens during the acute phase of infection. The lowest concentration of SARS-CoV-2 viral copies this assay can detect is 250 copies /  mL. A negative result does not preclude SARS-CoV-2 infection and should not be used as the sole basis for treatment or other patient management decisions.  A negative result may occur with improper specimen collection / handling, submission of specimen other than nasopharyngeal swab, presence of viral mutation(s) within the areas targeted by this assay, and inadequate number of viral copies (<250 copies / mL). A negative result must be combined with clinical observations, patient history, and epidemiological information.  Fact Sheet for Patients:   StrictlyIdeas.no  Fact Sheet for Healthcare Providers: BankingDealers.co.za  This test is not yet approved or  cleared by the [place] FDA and has been authorized for detection and/or diagnosis of SARS-CoV-2 by FDA under an Emergency Use Authorization (EUA).  This EUA will remain in effect (meaning this test can be used) for the duration of the COVID-19 declaration under Section 564(b)(1) of the Act, 21 U.S.C. section 360bbb-3(b)(1), unless the authorization is terminated or revoked sooner.  Performed at [facility], [place] [address], [place], [place] [address]   Culture, blood (routine x 2)     Status: None (Preliminary result)   Collection Time: 01/02/20  7:01 PM   Specimen: BLOOD  Result Value Ref Range Status   Specimen Description    Final    BLOOD BLOOD LEFT HAND Performed at [facility] [address], [place], [place] [address]    Special Requests   Final    BOTTLES DRAWN AEROBIC AND ANAEROBIC Blood Culture results may not be optimal due to an inadequate volume of blood received in culture bottles Performed at [facility] [address] [place], [place] [address]    Culture   Final    NO GROWTH 2 DAYS Performed at [facility] [address], [place], [place] [address]    Report Status PENDING  Incomplete  Culture, blood (routine x 2)     Status: None (Preliminary result)   Collection Time: 01/02/20  7:01 PM   Specimen: BLOOD  Result Value Ref Range Status   Specimen Description   Final    BLOOD BLOOD LEFT HAND Performed at [facility] [address], [place], [place] [address]    Special Requests   Final    BOTTLES DRAWN AEROBIC AND ANAEROBIC Blood Culture adequate volume Performed at [facility] [address], [place], [place] [address]    Culture   Final    NO GROWTH 2 DAYS Performed at [facility] [address] [place], [place] [address]    Report Status PENDING  Incomplete     Radiology Studies: US Abdomen Complete  Result Date: 01/03/2020 CLINICAL DATA:  Cirrhosis EXAM: ABDOMEN ULTRASOUND COMPLETE COMPARISON:  CT 05/13/2019, sonogram 03/30/2018 FINDINGS: The examination is limited by the patient's body habitus and overlying bowel gas. Gallbladder: No gallstones or wall thickening visualized. No sonographic Murphy sign noted by sonographer. Common bile duct: The extrahepatic bile duct is not well visualized on this examination Liver: The liver parenchyma demonstrates diffuse coarsening of the echotexture and the contour is nodular in keeping with changes of cirrhosis. No focal intrahepatic masses identified. There is no intrahepatic biliary ductal dilation. The main portal vein is patent, though is of  notably small caliber and demonstrates hepatofugal (reversed) flow. IVC: No abnormality visualized. Pancreas: Visualized portion unremarkable. Spleen: The spleen is mildly enlarged measuring 16.6 cm in greatest dimension. Right Kidney: Length: 11.5 cm. There is limited evaluation of the  lower pole of the right kidney due to overlying bowel gas. Echogenicity within normal limits. No mass or hydronephrosis visualized. Left Kidney: Length: 14.6 cm. Echogenicity within normal limits. No mass or hydronephrosis visualized. Abdominal aorta: No aneurysm visualized. Other findings: No ascites IMPRESSION: Technically limited examination with findings in keeping with cirrhosis. No focal intrahepatic mass identified. Small caliber portal vein with hepatofugal (reversed) flow. Mild splenomegaly, similar to prior examinations, in keeping with changes of portal venous hypertension. Electronically Signed   By: Fidela Salisbury MD   On: 01/03/2020 16:00    Marzetta Board, MD, PhD [facility]  Between 7 am - 7 pm I am available, please contact me via Amion or Securechat  Between 7 pm - 7 am I am not available, please contact night coverage MD/APP via Amion

## 2020-01-04 NOTE — Progress Notes (Signed)
Physical Therapy Treatment Patient Details Name: [name] MRN: [id number] DOB: 26-Aug-1965 Today's Date: 01/04/2020    History of Present Illness Pt admitted from homeless shelter with inability to ambulate 2* weakness and now dx with hepatic encephalopathy.  Pt with hx of HIV, ETOH abuse, and peripheral neuropathy    PT Comments    Pt states feeling better and with noted increased activity tolerance this session.  Pt up to EOB sitting to work on sitting balance and progressed to standing x 3 and able to side-step up side of bed but with noted instability and buckling at knees.   Follow Up Recommendations  SNF     Equipment Recommendations  Wheelchair (measurements PT)    Recommendations for Other Services OT consult     Precautions / Restrictions Precautions Precautions: Fall Precaution Comments: Has had multiple falls  Restrictions Weight Bearing Restrictions: No    Mobility  Bed Mobility Overal bed mobility: Needs Assistance Bed Mobility: Supine to Sit;Sit to Supine     Supine to sit: Mod assist;+2 for safety/equipment Sit to supine: Mod assist;Min assist;+2 for physical assistance;+2 for safety/equipment      Transfers Overall transfer level: Needs assistance Equipment used: Rolling walker (2 wheeled) Transfers: Sit to/from Stand Sit to Stand: +2 physical assistance;+2 safety/equipment;From elevated surface;Min assist;Mod assist         General transfer comment: cues for use of UEs to self assist;  physical assist to bring wt up and fwd and to balance in standing  Ambulation/Gait Ambulation/Gait assistance: Min assist;+2 physical assistance;+2 safety/equipment Gait Distance (Feet): 4 Feet Assistive device: Rolling walker (2 wheeled) Gait Pattern/deviations: Step-to pattern;Decreased step length - right;Decreased step length - left;Shuffle;Trunk flexed Gait velocity: decr   General Gait Details: Pt able to side step up side of bed only - unsafe to  step away from bedside 2* pt size and intermittent buckling at knees   Stairs             Wheelchair Mobility    Modified Rankin (Stroke Patients Only)       Balance Overall balance assessment: Needs assistance Sitting-balance support: No upper extremity supported Sitting balance-Leahy Scale: Fair     Standing balance support: Bilateral upper extremity supported Standing balance-Leahy Scale: Poor Standing balance comment: Pt reliant on UEs on RW for balance/support                            Cognition Arousal/Alertness: Awake/alert Behavior During Therapy: Flat affect Overall Cognitive Status: No family/caregiver present to determine baseline cognitive functioning                                 General Comments: Pt speaking more clearly but confusing timelines with intermittant stories      Exercises      General Comments        Pertinent Vitals/Pain Pain Assessment: No/denies pain    Home Living                      Prior Function            PT Goals (current goals can now be found in the care plan section) Acute Rehab PT Goals Patient Stated Goal: Be able to walk again PT Goal Formulation: With patient Time For Goal Achievement: 01/12/20 Potential to Achieve Goals: Fair Progress towards PT goals: Progressing toward goals  Frequency    Min 2X/week      PT Plan Current plan remains appropriate    Co-evaluation              AM-PAC PT "6 Clicks" Mobility   Outcome Measure  Help needed turning from your back to your side while in a flat bed without using bedrails?: A Lot Help needed moving from lying on your back to sitting on the side of a flat bed without using bedrails?: A Lot Help needed moving to and from a bed to a chair (including a wheelchair)?: A Lot Help needed standing up from a chair using your arms (e.g., wheelchair or bedside chair)?: A Lot Help needed to walk in hospital room?: A  Lot Help needed climbing 3-5 steps with a railing? : Total 6 Click Score: 11    End of Session Equipment Utilized During Treatment: Gait belt Activity Tolerance: Patient tolerated treatment well Patient left: in bed;with call bell/phone within reach Nurse Communication: Mobility status PT Visit Diagnosis: Muscle weakness (generalized) (M62.81);History of falling (Z91.81);Difficulty in walking, not elsewhere classified (R26.2)     Time: 7322-0254 PT Time Calculation (min) (ACUTE ONLY): 41 min  Charges:  $Gait Training: 8-22 mins $Therapeutic Activity: 23-37 mins                     [facility] Pager [phone] Office [phone]    Hasina Kreager 01/04/2020, 4:17 PM

## 2020-01-05 LAB — COMPREHENSIVE METABOLIC PANEL
ALT: 18 U/L (ref 0–44)
AST: 23 U/L (ref 15–41)
Albumin: 3.5 g/dL (ref 3.5–5.0)
Alkaline Phosphatase: 85 U/L (ref 38–126)
Anion gap: 11 (ref 5–15)
BUN: 25 mg/dL — ABNORMAL HIGH (ref 6–20)
CO2: 23 mmol/L (ref 22–32)
Calcium: 9 mg/dL (ref 8.9–10.3)
Chloride: 104 mmol/L (ref 98–111)
Creatinine, Ser: 1.36 mg/dL — ABNORMAL HIGH (ref 0.61–1.24)
GFR calc Af Amer: >60 mL/min (ref >60)
GFR calc non Af Amer: 58 mL/min — ABNORMAL LOW (ref >60)
Glucose, Bld: 101 mg/dL — ABNORMAL HIGH (ref 70–99)
Potassium: 4 mmol/L (ref 3.5–5.1)
Sodium: 138 mmol/L (ref 135–145)
Total Bilirubin: 1.2 mg/dL (ref 0.3–1.2)
Total Protein: 6.4 g/dL — ABNORMAL LOW (ref 6.5–8.1)

## 2020-01-05 LAB — CBC
HCT: 42.4 % (ref 39.0–52.0)
Hemoglobin: 13.5 g/dL (ref 13.0–17.0)
MCH: 28 pg (ref 26.0–34.0)
MCHC: 31.8 g/dL (ref 30.0–36.0)
MCV: 87.8 fL (ref 80.0–100.0)
Platelets: 105 10*3/uL — ABNORMAL LOW (ref 150–400)
RBC: 4.83 MIL/uL (ref 4.22–5.81)
RDW: 16.4 % — ABNORMAL HIGH (ref 11.5–15.5)
WBC: 5.1 10*3/uL (ref 4.0–10.5)
nRBC: 0 % (ref 0.0–0.2)

## 2020-01-05 NOTE — Progress Notes (Addendum)
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  Social worker has been involved in the ED for patient to be placed however became progressively more confused, he developed hepatic encephalopathy hospitalist were called for admission on 7/21.  GI was also consulted at that time.  Subjective / 24h Interval events: -Reports feeling better today, having bowel movements with lactulose, mind reportedly less foggy -Still complains of generalized weakness  Assessment & Plan:  Hepatic encephalopathy  Decompensated ALcoholic cirrhosis -Gastroenterology consult appreciated -Clinically improving on lactulose 30 g 3 times daily, also received lactulose enema on admission -Currently also started on rifaximin which may be cost prohibitive, also limited by lack of social support, homelessness -Asterixis still present -PT OT eval completed, SNF recommended versus short-term rehabilitation -Discharge planning  Acute kidney injury-creatinine stable around 1.4.  May just be a new baseline  Generalized weakness-in the setting of advanced liver disease, physical therapy recommends SNF, social worker consulted, SNF placement pending improvement in his encephalopathy  Homelessness-social worker consulted, SNF planned  HIV-continue home HAART therapy  DVT prophylaxis: enoxaparin (LOVENOX) Code Status: Full Code Family Communication: no family at bedside   Status is: Inpatient  Remains inpatient appropriate because:Inpatient level of care appropriate due to severity of illness,   Dispo: The patient is from: homeless              Anticipated d/c is to: SNF               Anticipated d/c date is: 2 to 3 days              Patient currently is not medically stable to d/c.  Consultants:  GI  Procedures:  none   Scheduled Meds: . aMILoride  15 mg Oral Daily  . amitriptyline  100 mg Oral QHS  . doravirine  100 mg Oral Daily  . emtricitabine-tenofovir AF  1 tablet Oral Daily  . enoxaparin (LOVENOX) injection  40 mg Subcutaneous Q24H  . folic acid  1 mg Oral Daily  . furosemide  80 mg Oral Daily  . lactulose  30 g Oral QID  . multivitamin with minerals  1 tablet Oral Daily  . pantoprazole  40 mg Oral Daily  . potassium chloride SA  40 mEq Oral Daily  . rifaximin  550 mg Oral BID  . thiamine  100 mg Oral Daily   Continuous Infusions: PRN Meds:.hydrOXYzine  Diet Orders (From admission, onward)    Start     Ordered   01/03/20 1210  Diet 2 gram sodium Room service appropriate? Yes; Fluid consistency: Thin  Diet effective now       Question Answer Comment  Room service appropriate? Yes   Fluid consistency: Thin      01/03/20 1209          Microbiology  none  Antimicrobials: none    Objective: Vitals:   01/04/20 0635 01/04/20 1615 01/04/20 2204 01/05/20 0549  BP: (!) 139/73 (!) 135/69 (!) 139/72 (!) 118/58  Pulse: 70 77 80 75  Resp: 18 18 16 16   Temp: 97.7 F (36.5 C) 98.3 F (36.8 C) 98.5 F (36.9 C) (!) 97.4 F (36.3 C)  TempSrc: Oral Oral Oral Oral  SpO2: 97% 99% 100%   Weight:      Height:        Intake/Output Summary (Last 24 hours) at 01/05/2020 1149 Last data filed at 01/05/2020 0850 Gross per 24 hour  Intake 2060 ml  Output 5250 ml  Net -3190 ml   Filed Weights   12/30/19 0955 01/03/20 0817  Weight: (!) 136.5 kg (!) 136.5 kg    Examination:  General, Pleasant obese male sitting up in bed, awake alert, oriented to self and partly to place HEENT: No icterus CVS: S1-S2, regular rate and rhythm Lungs clear anteriorly, decreased at the bases Abdomen: Obese, soft, nontender, mildly distended no fluid  thrill Extremities: Venous stasis changes, no edema Neuro: Positive asterixis, moves all extremities, no other localizing sign  Data Reviewed: I have independently reviewed following labs and imaging studies   CBC: Recent Labs  Lab 12/29/19 2258 01/02/20 1126 01/03/20 0752 01/04/20 0449 01/05/20 0606  WBC 5.4 4.0 4.4 4.6 5.1  NEUTROABS 3.6 2.6  --   --   --   HGB 13.7 14.4 14.9 14.3 13.5  HCT 42.3 45.7 46.8 45.7 42.4  MCV 85.6 88.4 89.3 88.6 87.8  PLT 119* PLATELET CLUMPS NOTED ON SMEAR, UNABLE TO ESTIMATE 99* 112* 798*   Basic Metabolic Panel: Recent Labs  Lab 12/29/19 2258 01/02/20 1126 01/03/20 0752 01/04/20 0449 01/05/20 0606  NA 141 143 145 142 138  K 3.2* 3.8 4.2 3.8 4.0  CL 104 103 107 107 104  CO2 24 27 25 24 23   GLUCOSE 112* 167* 106* 101* 101*  BUN 18 25* 24* 25* 25*  CREATININE 1.23 1.67* 1.39* 1.42* 1.36*  CALCIUM 9.4 9.6 9.7 9.3 9.0  MG  --   --   --  2.2  --    Liver Function Tests: Recent Labs  Lab 12/29/19 2258 01/02/20 1126 01/03/20 0752 01/04/20 0449 01/05/20 0606  AST 27 24 23 24 23   ALT 22 21 21 22 18   ALKPHOS 99 101 101 96 85  BILITOT 1.3* 1.5*  1.4* 1.5* 1.3* 1.2  PROT 7.2 7.1 7.1 6.8 6.4*  ALBUMIN 4.1 3.9 3.9 3.7 3.5   Coagulation Profile: Recent Labs  Lab 01/02/20 1126 01/04/20 0449  INR 1.1 1.1   HbA1C: No results for input(s): HGBA1C in the last 72 hours. CBG: No results for input(s): GLUCAP in the last 168 hours.  Recent Results (from the past 240 hour(s))  SARS Coronavirus 2 by RT PCR (hospital order, performed in [facility] hospital lab) Nasopharyngeal Nasopharyngeal Swab     Status: None   Collection Time: 01/01/20 12:56 PM   Specimen: Nasopharyngeal Swab  Result Value Ref Range Status   SARS Coronavirus 2 NEGATIVE NEGATIVE Final    Comment: (NOTE) SARS-CoV-2 target nucleic acids are NOT DETECTED.  The SARS-CoV-2 RNA is generally detectable in upper and lower respiratory specimens during the acute phase of  infection. The lowest concentration of SARS-CoV-2 viral copies this assay can detect is 250 copies / mL. A negative result does not preclude SARS-CoV-2 infection and should not be used as the sole basis for treatment or other patient management decisions.  A negative result may occur with improper specimen collection / handling, submission of specimen other than nasopharyngeal swab, presence of viral mutation(s) within the areas targeted by this assay, and inadequate number of viral copies (<250 copies / mL). A negative result must be combined with clinical observations, patient history, and epidemiological information.  Fact Sheet for Patients:   StrictlyIdeas.no  Fact Sheet for Healthcare Providers: BankingDealers.co.za  This test is not yet approved or  cleared by the [place] FDA and has been authorized for detection and/or diagnosis of SARS-CoV-2 by FDA under an Emergency Use Authorization (EUA).  This EUA will remain in effect (meaning this test can be used) for the duration of the COVID-19 declaration under Section 564(b)(1) of the Act, 21 U.S.C. section 360bbb-3(b)(1), unless the authorization is terminated or revoked sooner.  Performed at [facility], [place] [address] [place], [place] [address]   Culture, blood (routine x 2)     Status: None (Preliminary result)   Collection Time: 01/02/20  7:01 PM   Specimen: BLOOD  Result Value Ref Range Status   Specimen Description   Final    BLOOD BLOOD LEFT HAND Performed at [facility] [address], [place], [place] [address]    Special Requests   Final    BOTTLES DRAWN AEROBIC AND ANAEROBIC Blood Culture results may not be optimal due to an inadequate volume of blood received in culture bottles Performed at [facility] [address], [place], [place] [address]    Culture   Final    NO GROWTH 3 DAYS Performed at  [facility], [place] [address], [place], [place] [address]    Report Status PENDING  Incomplete  Culture, blood (routine x 2)     Status: None (Preliminary result)   Collection Time: 01/02/20  7:01 PM   Specimen: BLOOD  Result Value Ref Range Status   Specimen Description   Final    BLOOD BLOOD LEFT HAND Performed at [facility] [address] [place], [place] [address]    Special Requests   Final    BOTTLES DRAWN AEROBIC AND ANAEROBIC Blood Culture adequate volume Performed at [facility] [address] [place], [place] [address]    Culture   Final    NO GROWTH 3 DAYS Performed at [facility], [place] [address], [place],  [address]    Report Status PENDING  Incomplete     Radiology Studies: No results found.  Domenic Polite, MD [facility]

## 2020-01-06 LAB — BASIC METABOLIC PANEL
Anion gap: 10 (ref 5–15)
BUN: 25 mg/dL — ABNORMAL HIGH (ref 6–20)
CO2: 23 mmol/L (ref 22–32)
Calcium: 9.4 mg/dL (ref 8.9–10.3)
Chloride: 102 mmol/L (ref 98–111)
Creatinine, Ser: 1.13 mg/dL (ref 0.61–1.24)
GFR calc Af Amer: >60 mL/min (ref >60)
GFR calc non Af Amer: >60 mL/min (ref >60)
Glucose, Bld: 90 mg/dL (ref 70–99)
Potassium: 4.1 mmol/L (ref 3.5–5.1)
Sodium: 135 mmol/L (ref 135–145)

## 2020-01-06 LAB — CBC
HCT: 40.2 % (ref 39.0–52.0)
Hemoglobin: 13.4 g/dL (ref 13.0–17.0)
MCH: 28.3 pg (ref 26.0–34.0)
MCHC: 33.3 g/dL (ref 30.0–36.0)
MCV: 85 fL (ref 80.0–100.0)
Platelets: 95 10*3/uL — ABNORMAL LOW (ref 150–400)
RBC: 4.73 MIL/uL (ref 4.22–5.81)
RDW: 16.5 % — ABNORMAL HIGH (ref 11.5–15.5)
WBC: 5 10*3/uL (ref 4.0–10.5)
nRBC: 0 % (ref 0.0–0.2)

## 2020-01-06 NOTE — Progress Notes (Signed)
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  Social worker has been involved in the ED for patient to be placed however became progressively more confused, he developed hepatic encephalopathy hospitalist were called for admission on 7/21.  GI was also consulted at that time.  Subjective / 24h Interval events: -feels better today, continues to have bowel movements, reports less drowsiness  Assessment & Plan:  Hepatic encephalopathy  Decompensated ALcoholic cirrhosis -Gastroenterology consult appreciated -Clinically improving on lactulose 3 times daily, also received lactulose enema on admission -Currently also started on rifaximin which may be cost prohibitive, also limited by lack of social support, homelessness -Asterixis improving -PT OT eval completed, SNF recommended versus short-term rehabilitation -Discharge planning  Acute kidney injury- -creatinine improved from 1.6 down to 1.1  Generalized weakness-in the setting of advanced liver disease, physical therapy recommends SNF, social worker consulted, SNF placement pending improvement in his encephalopathy  Chronic thrombocytopenia -Secondary to liver cirrhosis and splenic sequestration  Homelessness-social worker consulted, SNF planned  HIV-continue home HAART therapy  DVT prophylaxis: enoxaparin (LOVENOX) Code Status: Full Code Family Communication: no family at bedside   Status is: Inpatient  Remains inpatient appropriate because:Inpatient level of care appropriate due to severity of illness,   Dispo: The patient is from: homeless              Anticipated d/c is to: SNF               Anticipated d/c date is: When SNF bed is available              Patient currently is medically stable to d/c.  Consultants:  GI  Procedures:  none   Scheduled Meds: . aMILoride  15 mg Oral Daily  . amitriptyline  100 mg Oral QHS  . doravirine  100 mg Oral Daily  . emtricitabine-tenofovir AF  1 tablet Oral Daily  . enoxaparin (LOVENOX) injection  40 mg Subcutaneous Q24H  . folic acid  1 mg Oral Daily  . furosemide  80 mg Oral Daily  . lactulose  30 g Oral QID  . multivitamin with minerals  1 tablet Oral Daily  . pantoprazole  40 mg Oral Daily  . potassium chloride SA  40 mEq Oral Daily  . rifaximin  550 mg Oral BID  . thiamine  100 mg Oral Daily   Continuous Infusions: PRN Meds:.hydrOXYzine  Diet Orders (From admission, onward)    Start     Ordered   01/03/20 1210  Diet 2 gram sodium Room service appropriate? Yes; Fluid consistency: Thin  Diet effective now       Question Answer Comment  Room service appropriate? Yes   Fluid consistency: Thin      01/03/20 1209          Microbiology  none  Antimicrobials: none    Objective: Vitals:   01/05/20 0549 01/05/20 1405 01/05/20 2123 01/06/20 0615  BP: (!) 118/58 (!) 129/82 (!) 131/81 114/72  Pulse: 75 73 73 71  Resp: 16  19 21   Temp: (!) 97.4 F (36.3 C) 98.1 F (36.7 C) 98.1 F (36.7 C) 98.8 F (37.1 C)  TempSrc: Oral Oral  SpO2:  95% 100% 96%  Weight:      Height:        Intake/Output Summary (Last 24 hours) at 01/06/2020 1230 Last data filed at 01/06/2020 0920 Gross per 24 hour  Intake 1380 ml  Output 1900 ml  Net -520 ml   Filed Weights   12/30/19 0955 01/03/20 0817  Weight: (!) 136.5 kg (!) 136.5 kg    Examination:  General, Pleasant obese male laying in bed, awake alert oriented to self, place and partly to time HEENT: No icterus CVS: S1-S2, regular rate rhythm Lungs: Clear anteriorly, decreased at bases Abdomen is obese, soft, nontender, mildly distended, no fluid thrill Extremities:  Venous stasis changes with no edema Neuro, mild asterixis and tremors overall improving   Data Reviewed: I have independently reviewed following labs and imaging studies   CBC: Recent Labs  Lab 01/02/20 1126 01/03/20 0752 01/04/20 0449 01/05/20 0606 01/06/20 0626  WBC 4.0 4.4 4.6 5.1 5.0  NEUTROABS 2.6  --   --   --   --   HGB 14.4 14.9 14.3 13.5 13.4  HCT 45.7 46.8 45.7 42.4 40.2  MCV 88.4 89.3 88.6 87.8 85.0  PLT PLATELET CLUMPS NOTED ON SMEAR, UNABLE TO ESTIMATE 99* 112* 105* 95*   Basic Metabolic Panel: Recent Labs  Lab 01/02/20 1126 01/03/20 0752 01/04/20 0449 01/05/20 0606 01/06/20 0626  NA 143 145 142 138 135  K 3.8 4.2 3.8 4.0 4.1  CL 103 107 107 104 102  CO2 27 25 24 23 23   GLUCOSE 167* 106* 101* 101* 90  BUN 25* 24* 25* 25* 25*  CREATININE 1.67* 1.39* 1.42* 1.36* 1.13  CALCIUM 9.6 9.7 9.3 9.0 9.4  MG  --   --  2.2  --   --    Liver Function Tests: Recent Labs  Lab 01/02/20 1126 01/03/20 0752 01/04/20 0449 01/05/20 0606  AST 24 23 24 23   ALT 21 21 22 18   ALKPHOS 101 101 96 85  BILITOT 1.5*  1.4* 1.5* 1.3* 1.2  PROT 7.1 7.1 6.8 6.4*  ALBUMIN 3.9 3.9 3.7 3.5   Coagulation Profile: Recent Labs  Lab 01/02/20 1126 01/04/20 0449  INR 1.1 1.1   HbA1C: No results for input(s): HGBA1C in the last 72 hours. CBG: No results for input(s): GLUCAP in the last 168 hours.  Recent Results (from the past 240 hour(s))  SARS Coronavirus 2 by RT PCR (hospital order, performed in [facility] hospital lab) Nasopharyngeal Nasopharyngeal Swab     Status: None   Collection Time: 01/01/20 12:56 PM   Specimen: Nasopharyngeal Swab  Result Value Ref Range Status   SARS Coronavirus 2 NEGATIVE NEGATIVE Final    Comment: (NOTE) SARS-CoV-2 target nucleic acids are NOT DETECTED.  The SARS-CoV-2 RNA is generally detectable in upper and lower respiratory specimens during the acute phase of infection. The lowest concentration of SARS-CoV-2 viral copies this assay can  detect is 250 copies / mL. A negative result does not preclude SARS-CoV-2 infection and should not be used as the sole basis for treatment or other patient management decisions.  A negative result may occur with improper specimen collection / handling, submission of specimen other than nasopharyngeal swab, presence of viral mutation(s) within the areas targeted by this assay, and inadequate number of viral copies (<250 copies / mL). A negative result must be combined with clinical observations, patient history, and epidemiological information.  Fact Sheet for Patients:   StrictlyIdeas.no  Fact Sheet for Healthcare Providers: BankingDealers.co.za  This test is not yet approved or  cleared by the [place] and has been authorized for detection and/or diagnosis of SARS-CoV-2 by FDA under an Emergency Use Authorization (EUA).  This EUA will remain in effect (meaning this test can be used) for the duration of the COVID-19 declaration under Section 564(b)(1) of the Act, 21 U.S.C. section 360bbb-3(b)(1), unless the authorization is terminated or revoked sooner.  Performed at [facility], [place] [address], [place], [place] [address]   Culture, blood (routine x 2)     Status: None (Preliminary result)   Collection Time: 01/02/20  7:01 PM   Specimen: BLOOD  Result Value Ref Range Status   Specimen Description   Final    BLOOD BLOOD LEFT HAND Performed at [place] [address], [place], [place] [address]    Special Requests   Final    BOTTLES DRAWN AEROBIC AND ANAEROBIC Blood Culture results may not be optimal due to an inadequate volume of blood received in culture bottles Performed at [facility] [address] [place], [place] [address]    Culture   Final    NO GROWTH 4 DAYS Performed at [facility], [place] [address], [place], [place] [address]    Report  Status PENDING  Incomplete  Culture, blood (routine x 2)     Status: None (Preliminary result)   Collection Time: 01/02/20  7:01 PM   Specimen: BLOOD  Result Value Ref Range Status   Specimen Description   Final    BLOOD BLOOD LEFT HAND Performed at [facility] [address], [place], [place] [address]    Special Requests   Final    BOTTLES DRAWN AEROBIC AND ANAEROBIC Blood Culture adequate volume Performed at [place] [address], [place], [place] [address]    Culture   Final    NO GROWTH 4 DAYS Performed at [facility], [place] [address] [place], [place] [address]    Report Status PENDING  Incomplete     Radiology Studies: No results found.  Domenic Polite, MD [facility]

## 2020-01-07 LAB — CULTURE, BLOOD (ROUTINE X 2)
Culture: NO GROWTH
Culture: NO GROWTH
Special Requests: ADEQUATE

## 2020-01-07 LAB — SARS CORONAVIRUS 2 BY RT PCR (HOSPITAL ORDER, PERFORMED IN ~~LOC~~ HOSPITAL LAB): SARS Coronavirus 2: NEGATIVE

## 2020-01-07 MED ORDER — RIFAXIMIN 550 MG PO TABS: 550.0000 mg | ORAL_TABLET | Freq: Two times a day (BID) | ORAL | 0 refills | Status: AC

## 2020-01-07 MED ORDER — GABAPENTIN 300 MG PO CAPS: 300.0000 mg | ORAL_CAPSULE | Freq: Every day | ORAL | Status: DC

## 2020-01-07 NOTE — TOC Transition Note (Signed)
Transition of Care [facility]) - CM/SW Discharge Note   Patient Details  Name: [name] MRN: [id number] Date of Birth: November 30, 1965  [facility] of [facility]) CM/SW Contact:  Lennart Pall, LCSW Phone Number: 01/07/2020, 11:08 AM   Clinical Narrative:    Have received insurance SNF authorization and MD notes pt medically clear for transfer.  Pt aware and agreeable with SNF plan and will transfer via [facility] to [facility] today.  No further TOC neeeds.    Final next level of care: Skilled Nursing Facility Barriers to Discharge: Barriers Resolved   Patient Goals and CMS Choice   CMS Medicare.gov Compare Post Acute Care list provided to:: Patient Choice offered to / list presented to : Patient  Discharge Placement              Patient chooses bed at: [facility] Patient to be transferred to facility by: [facility]   Patient and family notified of of transfer: 01/07/20  Discharge Plan and Services                DME Arranged: N/A DME Agency: NA       HH Arranged: NA HH Agency: NA        Social Determinants of Health ([place]) Interventions     Readmission Risk Interventions Readmission Risk Prevention Plan 04/11/2019  Transportation Screening Complete  PCP or Specialist Appt within 5-7 Days Complete  Home Care Screening Complete  Medication Review (RN CM) Complete  Some recent data might be hidden

## 2020-01-07 NOTE — Progress Notes (Signed)
Physical Therapy Treatment Patient Details Name: [name] MRN: [id number] DOB: 04-Jul-1965 Today's Date: 01/07/2020    History of Present Illness Pt admitted from homeless shelter with inability to ambulate 2* weakness and now dx with hepatic encephalopathy.  Pt with hx of HIV, ETOH abuse, and peripheral neuropathy    PT Comments    Pt ambulated in hallway short distance and requiring min assist today for steadying.  Pt pleased to be able to ambulate today and reports likely d/c to SNF later today.   Follow Up Recommendations  SNF     Equipment Recommendations  Wheelchair (measurements PT)    Recommendations for Other Services       Precautions / Restrictions Precautions Precautions: Fall Precaution Comments: Has had multiple falls     Mobility  Bed Mobility Overal bed mobility: Needs Assistance Bed Mobility: Supine to Sit     Supine to sit: Min assist     General bed mobility comments: pt able to better assist today, only requiring slight assist for trunk upright  Transfers Overall transfer level: Needs assistance Equipment used: Rolling walker (2 wheeled) Transfers: Sit to/from Stand Sit to Stand: Min assist         General transfer comment: verbal cues for hand placement and assist to rise and steady  Ambulation/Gait Ambulation/Gait assistance: Min assist Gait Distance (Feet): 30 Feet Assistive device: Rolling walker (2 wheeled) Gait Pattern/deviations: Trunk flexed;Step-through pattern;Decreased stride length     General Gait Details: verbal cues for use of RW, pt with tremulous UEs when not holding RW, assist for stability   Stairs             Wheelchair Mobility    Modified Rankin (Stroke Patients Only)       Balance                                            Cognition Arousal/Alertness: Awake/alert Behavior During Therapy: WFL for tasks assessed/performed Overall Cognitive Status: Within Functional Limits  for tasks assessed                                 General Comments: appropriate, appears aware of d/c events occurring today      Exercises      General Comments        Pertinent Vitals/Pain Pain Assessment: No/denies pain Pain Intervention(s): Monitored during session;Repositioned    Home Living                      Prior Function            PT Goals (current goals can now be found in the care plan section) Progress towards PT goals: Progressing toward goals    Frequency    Min 2X/week      PT Plan Current plan remains appropriate    Co-evaluation              AM-PAC PT "6 Clicks" Mobility   Outcome Measure  Help needed turning from your back to your side while in a flat bed without using bedrails?: A Little Help needed moving from lying on your back to sitting on the side of a flat bed without using bedrails?: A Little Help needed moving to and from a bed to a chair (including a wheelchair)?:  A Little Help needed standing up from a chair using your arms (e.g., wheelchair or bedside chair)?: A Little Help needed to walk in hospital room?: A Little Help needed climbing 3-5 steps with a railing? : Total 6 Click Score: 16    End of Session Equipment Utilized During Treatment: Gait belt Activity Tolerance: Patient tolerated treatment well Patient left: with call bell/phone within reach;in chair;with chair alarm set Nurse Communication: Mobility status PT Visit Diagnosis: Muscle weakness (generalized) (M62.81);History of falling (Z91.81);Difficulty in walking, not elsewhere classified (R26.2)     Time: 9987-2158 PT Time Calculation (min) (ACUTE ONLY): 19 min  Charges:  $Gait Training: 8-22 mins                    Arlyce Dice, DPT Acute Rehabilitation Services Pager: [phone] Office: [phone]  Trena Platt 01/07/2020, 3:44 PM

## 2020-01-07 NOTE — Progress Notes (Addendum)
     Progress Note  CC:    cirrhosis      ASSESSMENT AND PLAN:   # Decompensated etoh cirrhosis with hepatic encephalopathy. Hx of esophageal varices / remote SBP / diagnosis of Wernicke's encephalopathy --No Etoh in years he says. --Poor social situation / homelessness complicating care --Alert and oriented today. No asterixis on exam.   --Platelets 95, INR 1.1. Hgb 13.4.  --On Amiloride and lasix 80 mg daily at home but says he wasn't taking lasix everyday and wasn't following low salt diet. Unclear why he is not on Aldactone ?Marland Kitchen No ascites on [place] this admission and very little edema.  Admitted with AKI. Lasix 80 mg daily may be excessive for him.  --2 gram sodium diet.    --Remote SBP. No ascites this admisson --HE improving after enemas / po lactulose and xifaxan. Agree that Xifaxan will be cost-prohibitive at home but if going to SNF then maybe able to get it.  --Continue po lactulose QID. Goal is 2-3 BMs / day. May need to tirate dose downward to TID if having excessive BMs.  --HCC screening: Normal AFP, no focal liver lesions on [place] --Esophageal varices. Grade I esoph varices on last EGD Oct 2018. Due for follow up screening but will arrange as outpatient.  --Follow up made for Aug 18 at 2pm with Nicoletta Ba, P.A.    # AKI, resolved.   # HIV, on therapy.      SUBJECTIVE   Feels okay, having BMs. Up walking with PT now. Appetite is okay.   OBJECTIVE:     Vital signs in last 24 hours: Temp:  [97.5 F (36.4 C)-98.2 F (36.8 C)] 97.5 F (36.4 C) (07/26 0603) Pulse Rate:  [69-76] 69 (07/26 0603) Resp:  [18] 18 (07/26 0603) BP: (111-132)/(63-71) 132/71 (07/26 0603) SpO2:  [94 %-99 %] 94 % (07/26 0603) Last BM Date: 01/04/20 General:   Alert, in NAD Heart:  Regular rate and rhythm.  Minimal BLE edema   Pulm: Normal respiratory effort   Abdomen:  Soft,  Obese, nontender, nondistended.           Neurologic:  Alert and  oriented,  grossly normal neurologically. Psych:   Pleasant, cooperative.  Normal mood and affect.   Intake/Output from previous day: 07/25 0701 - 07/26 0700 In: 2760 [P.O.:2760] Out: 1000 [Urine:1000] Intake/Output this shift: No intake/output data recorded.  Lab Results: Recent Labs    01/05/20 0606 01/06/20 0626  WBC 5.1 5.0  HGB 13.5 13.4  HCT 42.4 40.2  PLT 105* 95*   BMET Recent Labs    01/05/20 0606 01/06/20 0626  NA 138 135  K 4.0 4.1  CL 104 102  CO2 23 23  GLUCOSE 101* 90  BUN 25* 25*  CREATININE 1.36* 1.13  CALCIUM 9.0 9.4   LFT Recent Labs    01/05/20 0606  PROT 6.4*  ALBUMIN 3.5  AST 23  ALT 18  ALKPHOS 85  BILITOT 1.2     LOS: 5 days   Tye Savoy ,NP 01/07/2020, 9:56 AM   ________________________________________________________________________  Velora Heckler GI MD note:  I personally examined the patient, reviewed the data and agree with the assessment and plan described above.  He is safe for d/c from GI perspective, already has follow up shcheduled  about 3 weeks in our office.  Please call or page with any further questions or concerns.   Owens Loffler, MD [facility] Pager [phone]

## 2020-01-07 NOTE — Discharge Summary (Signed)
Physician Discharge Summary  [name]:[id number] DOB: 10/27/1965 DOA: 12/30/2019  PCP: [place], Betty G, MD  Admit date: 12/30/2019 Discharge date: 01/07/2020  Time spent: 35 minutes  Recommendations for Outpatient Follow-up:  1. SNF for short-term rehab  2. Please check BMP in 1 week 3. Gastroenterology Dr. Carlean Purl in 2 weeks   Discharge Diagnoses:  Alcoholic liver cirrhosis Decompensated cirrhosis Hepatic encephalopathy Wernicke's encephalopathy Encephalopathy Cirrhosis of liver ([place]) AKI (acute kidney injury) ([place]) HIV Morbid obesity   Discharge Condition: Stable  Diet recommendation: Low-sodium Filed Weights   12/30/19 0955 01/03/20 0817  Weight: (!) 136.5 kg (!) 136.5 kg    History of present illness:  54 year old male with history of HIV on medications, liver cirrhosis due to EtOH in the past, follows with [facility], homelessness living in shelters who came into the ED on 7/18 for difficulty ambulating, caring for himself.  Social worker has been involved in the ED for patient to be placed however became progressively more confused, he developed hepatic encephalopathy and admitted to [facility] on 7/21  Hospital Course:   Hepatic encephalopathy  Decompensated ALcoholic cirrhosis -Gastroenterology consult appreciated -Clinically improving on lactulose 3 times daily, also received lactulose enema on admission -also started on rifaximin this admission -Clinically improving, mentation and asterixis have improved, abdominal ultrasound did not note any ascites at this time -Lasix resumed at 80 mg daily -Due to overall physical debility, morbid obesity physical therapy recommended short-term rehab -Discharged to SNF for short-term rehabilitation -Continue PPI daily along with lactulose, follow-up with Dr. Carlean Purl,  gastroenterology in 2 weeks  Acute kidney injury- -creatinine improved from 1.6 down to 1.1  Generalized weakness-in the setting of  advanced liver disease, physical therapy recommends SNF, social worker consulted, SNF placement planned  Chronic thrombocytopenia -Secondary to liver cirrhosis and splenic sequestration  Homelessness-social worker consulted,  -PT OT eval completed, SNF was recommended for rehab  -He will be discharged to short-term rehab   HIV-continue home HAART therapy -Follow-up with Dr. Drucilla Schmidt at infectious disease clinic  Consultants:  GI  Procedures:  none   Discharge Exam: Vitals:   01/06/20 2151 01/07/20 0603  BP: 111/70 (!) 132/71  Pulse: 70 69  Resp: 18 18  Temp: 98.1 F (36.7 C) (!) 97.5 F (36.4 C)  SpO2: 99% 94%    General: AAOx2, no distress Cardiovascular: S1S2/RRR Respiratory: CTAB  Discharge Instructions   Discharge Instructions    Diet - low sodium heart healthy   Complete by: As directed    Increase activity slowly   Complete by: As directed      Allergies as of 01/07/2020      Reactions   Penicillins    REACTION: hives Has patient had a PCN reaction causing immediate rash, facial/tongue/throat swelling, SOB or lightheadedness with hypotension: Yes Has patient had a PCN reaction causing severe rash involving mucus membranes or skin necrosis: Yes Has patient had a PCN reaction that required hospitalization No Has patient had a PCN reaction occurring within the last 10 years: Yes If all of the above answers are "NO", then may proceed with Cephalosporin use./Per pt makes him feel weird!   Sulfa Antibiotics    Pt states he did not have a reaction last time to Sulfa!      Medication List    STOP taking these medications   nystatin powder Commonly known as: MYCOSTATIN/NYSTOP     TAKE these medications   aMILoride 5 MG tablet Commonly known as: MIDAMOR TAKE 3 TABLETS(15  MG) BY MOUTH DAILY What changed: See the new instructions.   amitriptyline 100 MG tablet Commonly known as: ELAVIL TAKE 1 TABLET(100 MG) BY MOUTH AT BEDTIME What changed:    how much to take  how to take this  when to take this   Descovy 200-25 MG tablet Generic drug: emtricitabine-tenofovir AF Take 1 tablet by mouth daily.   furosemide 80 MG tablet Commonly known as: LASIX TAKE 1 TABLET(80 MG) BY MOUTH DAILY What changed: See the new instructions.   gabapentin 300 MG capsule Commonly known as: NEURONTIN Take 1 capsule (300 mg total) by mouth at bedtime. What changed: how much to take   lactulose 10 GM/15ML solution Commonly known as: CHRONULAC Take 30 mLs (20 g total) by mouth 3 (three) times daily.   NexIUM 40 MG capsule Generic drug: esomeprazole Take 1 capsule (40 mg total) by mouth daily at 12 noon.   Pifeltro 100 MG Tabs tablet Generic drug: doravirine Take 100 mg by mouth daily.   potassium chloride SA 20 MEQ tablet Commonly known as: KLOR-CON Take 2 tablets (40 mEq total) by mouth daily.   rifaximin 550 MG Tabs tablet Commonly known as: XIFAXAN Take 1 tablet (550 mg total) by mouth 2 (two) times daily.      Allergies  Allergen Reactions  . Penicillins     REACTION: hives Has patient had a PCN reaction causing immediate rash, facial/tongue/throat swelling, SOB or lightheadedness with hypotension: Yes Has patient had a PCN reaction causing severe rash involving mucus membranes or skin necrosis: Yes Has patient had a PCN reaction that required hospitalization No Has patient had a PCN reaction occurring within the last 10 years: Yes If all of the above answers are "NO", then may proceed with Cephalosporin use./Per pt makes him feel weird!   . Sulfa Antibiotics     Pt states he did not have a reaction last time to Sulfa!    Contact information for follow-up providers    [facility]. Call.   Why: Monday-Friday 9-5 to get connected to resources. Contact information:  [employer]. [place], [place] [address] [phone] . Fax: [place] [employer] [place], [place]  [address] [phone]        Gatha Mayer, MD. Schedule an appointment as soon as possible for a visit in 2 week(s).   Specialty: Gastroenterology Contact information: [address] [address] [phone]            Contact information for after-discharge care    Destination    [facility] .   Service: Skilled Nursing Contact information: [place] [phone]                   The results of significant diagnostics from this hospitalization (including imaging, microbiology, ancillary and laboratory) are listed below for reference.    Significant Diagnostic Studies: CT Head Wo Contrast  Result Date: 12/10/2019 CLINICAL DATA:  Head trauma secondary to a fall this morning. EXAM: CT HEAD WITHOUT CONTRAST TECHNIQUE: Contiguous axial images were obtained from the base of the skull through the vertex without intravenous contrast. COMPARISON:  12/05/2019 FINDINGS: Brain: No evidence of acute infarction, hemorrhage, hydrocephalus, extra-axial collection or mass lesion/mass effect. Vascular: No hyperdense vessel or unexpected calcification. Skull: Normal. Negative for fracture or focal lesion. Sinuses/Orbits: Normal. Other: None IMPRESSION: Normal exam. Electronically Signed   By: Lorriane Shire M.D.  On: 12/10/2019 13:15   US Abdomen Complete  Result Date: 01/03/2020 CLINICAL DATA:  Cirrhosis EXAM: ABDOMEN ULTRASOUND COMPLETE COMPARISON:  CT 05/13/2019, sonogram 03/30/2018 FINDINGS: The examination is limited by the patient's body habitus and overlying bowel gas. Gallbladder: No gallstones or wall thickening visualized. No sonographic Murphy sign noted by sonographer. Common bile duct: The extrahepatic bile duct is not well visualized on this examination Liver: The liver parenchyma demonstrates diffuse coarsening of the echotexture and the contour is nodular in keeping with changes of cirrhosis. No focal  intrahepatic masses identified. There is no intrahepatic biliary ductal dilation. The main portal vein is patent, though is of notably small caliber and demonstrates hepatofugal (reversed) flow. IVC: No abnormality visualized. Pancreas: Visualized portion unremarkable. Spleen: The spleen is mildly enlarged measuring 16.6 cm in greatest dimension. Right Kidney: Length: 11.5 cm. There is limited evaluation of the lower pole of the right kidney due to overlying bowel gas. Echogenicity within normal limits. No mass or hydronephrosis visualized. Left Kidney: Length: 14.6 cm. Echogenicity within normal limits. No mass or hydronephrosis visualized. Abdominal aorta: No aneurysm visualized. Other findings: No ascites IMPRESSION: Technically limited examination with findings in keeping with cirrhosis. No focal intrahepatic mass identified. Small caliber portal vein with hepatofugal (reversed) flow. Mild splenomegaly, similar to prior examinations, in keeping with changes of portal venous hypertension. Electronically Signed   By: Fidela Salisbury MD   On: 01/03/2020 16:00   DG CHEST PORT 1 VIEW  Result Date: 01/02/2020 CLINICAL DATA:  Inability to ambulate secondary to weakness EXAM: PORTABLE CHEST 1 VIEW COMPARISON:  Radiograph 11/19/2019 FINDINGS: There are chronically coarsened interstitial changes which are likely accentuated by some volume loss and atelectatic change. Features of mild vascular congestion are present as well with cephalized, indistinct pulmonary vascularity. Prominent cardiac silhouette may be accentuated by the portable technique and low volumes. Remaining cardiomediastinal contours are unremarkable. No acute osseous or soft tissue abnormality. IMPRESSION: Low volumes and atelectasis. Additional features may suggest mild interstitial edema superimposed on more chronic coarsened interstitial changes. Electronically Signed   By: Lovena Le M.D.   On: 01/02/2020 19:09   [place] ASCITES (ABDOMEN  LIMITED)  Result Date: 01/02/2020 CLINICAL DATA:  Abdominal distension. EXAM: LIMITED ABDOMEN ULTRASOUND FOR ASCITES TECHNIQUE: Limited ultrasound survey for ascites was performed in all four abdominal quadrants. COMPARISON:  None. FINDINGS: No ascites is seen in any quadrant of the abdomen. IMPRESSION: No ascites noted. Electronically Signed   By: Marijo Conception M.D.   On: 01/02/2020 14:53    Microbiology: Recent Results (from the past 240 hour(s))  SARS Coronavirus 2 by RT PCR (hospital order, performed in [facility] hospital lab) Nasopharyngeal Nasopharyngeal Swab     Status: None   Collection Time: 01/01/20 12:56 PM   Specimen: Nasopharyngeal Swab  Result Value Ref Range Status   SARS Coronavirus 2 NEGATIVE NEGATIVE Final    Comment: (NOTE) SARS-CoV-2 target nucleic acids are NOT DETECTED.  The SARS-CoV-2 RNA is generally detectable in upper and lower respiratory specimens during the acute phase of infection. The lowest concentration of SARS-CoV-2 viral copies this assay can detect is 250 copies / mL. A negative result does not preclude SARS-CoV-2 infection and should not be used as the sole basis for treatment or other patient management decisions.  A negative result may occur with improper specimen collection / handling, submission of specimen other than nasopharyngeal swab, presence of viral mutation(s) within the areas targeted by this assay, and inadequate number of viral copies (<250  copies / mL). A negative result must be combined with clinical observations, patient history, and epidemiological information.  Fact Sheet for Patients:   StrictlyIdeas.no  Fact Sheet for Healthcare Providers: BankingDealers.co.za  This test is not yet approved or  cleared by the [place] FDA and has been authorized for detection and/or diagnosis of SARS-CoV-2 by FDA under an Emergency Use Authorization (EUA).  This EUA will remain in  effect (meaning this test can be used) for the duration of the COVID-19 declaration under Section 564(b)(1) of the Act, 21 U.S.C. section 360bbb-3(b)(1), unless the authorization is terminated or revoked sooner.  Performed at [facility], [place] [address], [place], [place] [address]   Culture, blood (routine x 2)     Status: None (Preliminary result)   Collection Time: 01/02/20  7:01 PM   Specimen: BLOOD  Result Value Ref Range Status   Specimen Description   Final    BLOOD BLOOD LEFT HAND Performed at [place] [address] [place], [place] [address]    Special Requests   Final    BOTTLES DRAWN AEROBIC AND ANAEROBIC Blood Culture results may not be optimal due to an inadequate volume of blood received in culture bottles Performed at [place] [address] [place], [place] [address]    Culture   Final    NO GROWTH 4 DAYS Performed at [facility], [place] [address] [place], [place] [address]    Report Status PENDING  Incomplete  Culture, blood (routine x 2)     Status: None (Preliminary result)   Collection Time: 01/02/20  7:01 PM   Specimen: BLOOD  Result Value Ref Range Status   Specimen Description   Final    BLOOD BLOOD LEFT HAND Performed at [facility] [address], [place], [place] [address]    Special Requests   Final    BOTTLES DRAWN AEROBIC AND ANAEROBIC Blood Culture adequate volume Performed at [facility] [place] [address], [place], [place] [address]    Culture   Final    NO GROWTH 4 DAYS Performed at [facility], [place] [address] [place], [place] [address]    Report Status PENDING  Incomplete     Labs: Basic Metabolic Panel: Recent Labs  Lab 01/02/20 1126 01/03/20 0752 01/04/20 0449 01/05/20 0606 01/06/20 0626  NA 143 145 142 138 135  K 3.8 4.2 3.8 4.0 4.1  CL 103 107 107 104 102  CO2 27 25 24 23 23   GLUCOSE 167* 106* 101* 101*  90  BUN 25* 24* 25* 25* 25*  CREATININE 1.67* 1.39* 1.42* 1.36* 1.13  CALCIUM 9.6 9.7 9.3 9.0 9.4  MG  --   --  2.2  --   --    Liver Function Tests: Recent Labs  Lab 01/02/20 1126 01/03/20 0752 01/04/20 0449 01/05/20 0606  AST 24 23 24 23   ALT 21 21 22 18   ALKPHOS 101 101 96 85  BILITOT 1.5*  1.4* 1.5* 1.3* 1.2  PROT 7.1 7.1 6.8 6.4*  ALBUMIN 3.9 3.9 3.7 3.5   Recent Labs  Lab 01/02/20 1126  LIPASE 29   Recent Labs  Lab 01/02/20 1126 01/03/20 0752  AMMONIA 136* 95*   CBC: Recent Labs  Lab 01/02/20 1126 01/03/20 0752 01/04/20 0449 01/05/20 0606 01/06/20 0626  WBC 4.0 4.4 4.6 5.1 5.0  NEUTROABS 2.6  --   --   --   --  HGB 14.4 14.9 14.3 13.5 13.4  HCT 45.7 46.8 45.7 42.4 40.2  MCV 88.4 89.3 88.6 87.8 85.0  PLT PLATELET CLUMPS NOTED ON SMEAR, UNABLE TO ESTIMATE 99* 112* 105* 95*   Cardiac Enzymes: No results for input(s): CKTOTAL, CKMB, CKMBINDEX, TROPONINI in the last 168 hours. BNP: BNP (last 3 results) Recent Labs    11/19/19 1608  BNP 27.7    ProBNP (last 3 results) No results for input(s): PROBNP in the last 8760 hours.  CBG: No results for input(s): GLUCAP in the last 168 hours.  Signed:  Domenic Polite MD.  [facility] 01/07/2020, 10:18 AM

## 2020-01-07 NOTE — Progress Notes (Signed)
Attempted to call report to [facility] but no one available to take report will attempt again and will leave floor number on packet.

## 2020-01-28 ENCOUNTER — Ambulatory Visit: Payer: Medicaid Other | Admitting: Family Medicine

## 2020-01-30 ENCOUNTER — Ambulatory Visit (INDEPENDENT_AMBULATORY_CARE_PROVIDER_SITE_OTHER): Payer: Self-pay | Admitting: Physician Assistant

## 2020-01-30 ENCOUNTER — Other Ambulatory Visit (INDEPENDENT_AMBULATORY_CARE_PROVIDER_SITE_OTHER): Payer: Medicaid Other

## 2020-01-30 ENCOUNTER — Encounter: Payer: Self-pay | Admitting: Physician Assistant

## 2020-01-30 VITALS — BP 98/58 | HR 88 | Ht 72.0 in | Wt 329.6 lb

## 2020-01-30 DIAGNOSIS — K7290 Hepatic failure, unspecified without coma: Secondary | ICD-10-CM

## 2020-01-30 DIAGNOSIS — K7031 Alcoholic cirrhosis of liver with ascites: Secondary | ICD-10-CM

## 2020-01-30 DIAGNOSIS — K7682 Hepatic encephalopathy: Secondary | ICD-10-CM

## 2020-01-30 LAB — COMPREHENSIVE METABOLIC PANEL
ALT: 17 U/L (ref 0–53)
AST: 25 U/L (ref 0–37)
Albumin: 4.1 g/dL (ref 3.5–5.2)
Alkaline Phosphatase: 104 U/L (ref 39–117)
BUN: 14 mg/dL (ref 6–23)
CO2: 25 mEq/L (ref 19–32)
Calcium: 9.7 mg/dL (ref 8.4–10.5)
Chloride: 105 mEq/L (ref 96–112)
Creatinine, Ser: 1.27 mg/dL (ref 0.40–1.50)
GFR: 58.83 mL/min — ABNORMAL LOW (ref >60.00)
Glucose, Bld: 120 mg/dL — ABNORMAL HIGH (ref 70–99)
Potassium: 3.7 mEq/L (ref 3.5–5.1)
Sodium: 141 mEq/L (ref 135–145)
Total Bilirubin: 1 mg/dL (ref 0.2–1.2)
Total Protein: 7.1 g/dL (ref 6.0–8.3)

## 2020-01-30 LAB — CBC WITH DIFFERENTIAL/PLATELET
Basophils Absolute: 0 10*3/uL (ref 0.0–0.1)
Basophils Relative: 0.7 % (ref 0.0–3.0)
Eosinophils Absolute: 0.1 10*3/uL (ref 0.0–0.7)
Eosinophils Relative: 3.3 % (ref 0.0–5.0)
HCT: 42.1 % (ref 39.0–52.0)
Hemoglobin: 13.9 g/dL (ref 13.0–17.0)
Lymphocytes Relative: 26.9 % (ref 12.0–46.0)
Lymphs Abs: 1 10*3/uL (ref 0.7–4.0)
MCHC: 33 g/dL (ref 30.0–36.0)
MCV: 87.8 fl (ref 78.0–100.0)
Monocytes Absolute: 0.3 10*3/uL (ref 0.1–1.0)
Monocytes Relative: 8.8 % (ref 3.0–12.0)
Neutro Abs: 2.2 10*3/uL (ref 1.4–7.7)
Neutrophils Relative %: 60.3 % (ref 43.0–77.0)
Platelets: 92 10*3/uL — ABNORMAL LOW (ref 150.0–400.0)
RBC: 4.79 Mil/uL (ref 4.22–5.81)
RDW: 18.7 % — ABNORMAL HIGH (ref 11.5–15.5)
WBC: 3.7 10*3/uL — ABNORMAL LOW (ref 4.0–10.5)

## 2020-01-30 LAB — AMMONIA: Ammonia: 209 umol/L — ABNORMAL HIGH (ref 11–35)

## 2020-01-30 LAB — PROTIME-INR
INR: 1.2 ratio — ABNORMAL HIGH (ref 0.8–1.0)
Prothrombin Time: 12.9 s (ref 9.6–13.1)

## 2020-01-30 MED ORDER — LACTULOSE ENCEPHALOPATHY 10 GM/15ML PO SOLN: 30.0000 g | Freq: Three times a day (TID) | ORAL | 3 refills | Status: DC

## 2020-01-30 NOTE — Patient Instructions (Addendum)
  Your provider has requested that you go to the basement level for lab work before leaving today. Press "B" on the elevator. The lab is located at the first door on the left as you exit the elevator.   Continue:  Lasix 80mg  daily Omeprazole 20mg  daily Lactulose 30gm three times a day Xifaxan 569m twice a day  Continue 2gm sodium diet  Please follow up with Dr. Carlean Purl on 04/01/2020 at 2:10pm
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
## 2020-02-06 ENCOUNTER — Telehealth: Payer: Self-pay

## 2020-02-06 NOTE — Telephone Encounter (Signed)
Ok to just leave on 45 cc QID

## 2020-02-06 NOTE — Telephone Encounter (Signed)
-----   Message from Alfredia Ferguson, [place] sent at 02/04/2020  4:16 PM EDT ----- Please call Nursing Home - [facility] I believe- ammonia is 204 - they need to give him  2 extra doses of lactulose  30cc each today , then go to 45 cc lactulose  4 x daily  chronically

## 2020-02-06 NOTE — Telephone Encounter (Signed)
Called the [facility] to give new instructions for Lactulose. Patient's Lactulose was increased by the provider at the home to 45 ml PO QID from TID. Patient was then seen in the ED for urinary retention. Ammonia was checked. Level is now 140 down from 209.  Do you still want the 2 extra doses of Lactulose?

## 2020-02-07 ENCOUNTER — Other Ambulatory Visit: Payer: Self-pay | Admitting: Family Medicine

## 2020-02-08 ENCOUNTER — Ambulatory Visit: Payer: Medicaid Other | Admitting: Family Medicine

## 2020-02-11 ENCOUNTER — Ambulatory Visit (INDEPENDENT_AMBULATORY_CARE_PROVIDER_SITE_OTHER): Payer: Medicaid Other | Admitting: Infectious Disease

## 2020-02-11 ENCOUNTER — Encounter: Payer: Self-pay | Admitting: Infectious Disease

## 2020-02-11 ENCOUNTER — Other Ambulatory Visit: Payer: Self-pay

## 2020-02-11 VITALS — BP 121/76 | HR 90 | Temp 99.0°F

## 2020-02-11 DIAGNOSIS — I10 Essential (primary) hypertension: Secondary | ICD-10-CM

## 2020-02-11 DIAGNOSIS — K7031 Alcoholic cirrhosis of liver with ascites: Secondary | ICD-10-CM

## 2020-02-11 DIAGNOSIS — B20 Human immunodeficiency virus [HIV] disease: Secondary | ICD-10-CM

## 2020-02-11 DIAGNOSIS — K7682 Hepatic encephalopathy: Secondary | ICD-10-CM

## 2020-02-11 DIAGNOSIS — K7290 Hepatic failure, unspecified without coma: Secondary | ICD-10-CM

## 2020-02-11 DIAGNOSIS — K766 Portal hypertension: Secondary | ICD-10-CM

## 2020-02-11 DIAGNOSIS — K3189 Other diseases of stomach and duodenum: Secondary | ICD-10-CM

## 2020-02-11 DIAGNOSIS — T7611XD Adult physical abuse, suspected, subsequent encounter: Secondary | ICD-10-CM

## 2020-02-11 DIAGNOSIS — I8511 Secondary esophageal varices with bleeding: Secondary | ICD-10-CM

## 2020-02-11 DIAGNOSIS — E785 Hyperlipidemia, unspecified: Secondary | ICD-10-CM

## 2020-02-11 DIAGNOSIS — F411 Generalized anxiety disorder: Secondary | ICD-10-CM

## 2020-02-11 DIAGNOSIS — D61818 Other pancytopenia: Secondary | ICD-10-CM

## 2020-02-11 DIAGNOSIS — G9009 Other idiopathic peripheral autonomic neuropathy: Secondary | ICD-10-CM
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
## 2020-02-12 ENCOUNTER — Telehealth: Payer: Self-pay | Admitting: Family Medicine

## 2020-02-12 DIAGNOSIS — Z978 Presence of other specified devices: Secondary | ICD-10-CM

## 2020-02-12 LAB — T-HELPER CELL (CD4) - (RCID CLINIC ONLY)
CD4 % Helper T Cell: 33 % (ref 33–65)
CD4 T Cell Abs: 271 /uL — ABNORMAL LOW (ref 400–1790)

## 2020-02-12 NOTE — Telephone Encounter (Signed)
Pt stated he went to the ER dept [facility] and they put a catheter in since last Tuesday and he has been in pain since. He said it burns and he is very uncomfortable. He is wondering what he should do?   Pt can be reached at [phone]

## 2020-02-12 NOTE — Telephone Encounter (Signed)
Urine cath should not be causing pain. Is he supposed to follow with urologist?  If pain gets worse he may need to be evaluated again. Thanks, BJ

## 2020-02-12 NOTE — Telephone Encounter (Signed)
I spoke with patient. I do not see where a referral was placed to urology for him and he was not aware of one. Referral placed and marked urgent.

## 2020-02-13 LAB — COMPLETE METABOLIC PANEL WITH GFR
AG Ratio: 1.5 (calc) (ref 1.0–2.5)
ALT: 15 U/L (ref 9–46)
AST: 22 U/L (ref 10–35)
Albumin: 3.9 g/dL (ref 3.6–5.1)
Alkaline phosphatase (APISO): 108 U/L (ref 35–144)
BUN: 14 mg/dL (ref 7–25)
CO2: 23 mmol/L (ref 20–32)
Calcium: 9.3 mg/dL (ref 8.6–10.3)
Chloride: 105 mmol/L (ref 98–110)
Creat: 1.3 mg/dL (ref 0.70–1.33)
GFR, Est African American: 71 mL/min/{1.73_m2} (ref >=60)
GFR, Est Non African American: 61 mL/min/{1.73_m2} (ref >=60)
Globulin: 2.6 g/dL (calc) (ref 1.9–3.7)
Glucose, Bld: 150 mg/dL — ABNORMAL HIGH (ref 65–99)
Potassium: 3.7 mmol/L (ref 3.5–5.3)
Sodium: 139 mmol/L (ref 135–146)
Total Bilirubin: 1.3 mg/dL — ABNORMAL HIGH (ref 0.2–1.2)
Total Protein: 6.5 g/dL (ref 6.1–8.1)

## 2020-02-13 LAB — LIPID PANEL
Cholesterol: 132 mg/dL (ref <200)
HDL: 50 mg/dL (ref >=40)
LDL Cholesterol (Calc): 63 mg/dL (calc)
Non-HDL Cholesterol (Calc): 82 mg/dL (calc) (ref <130)
Total CHOL/HDL Ratio: 2.6 (calc) (ref <5.0)
Triglycerides: 108 mg/dL (ref <150)

## 2020-02-13 LAB — CBC WITH DIFFERENTIAL/PLATELET
Absolute Monocytes: 350 cells/uL (ref 200–950)
Basophils Absolute: 32 cells/uL (ref 0–200)
Basophils Relative: 0.7 %
Eosinophils Absolute: 78 cells/uL (ref 15–500)
Eosinophils Relative: 1.7 %
HCT: 37.6 % — ABNORMAL LOW (ref 38.5–50.0)
Hemoglobin: 12.7 g/dL — ABNORMAL LOW (ref 13.2–17.1)
Lymphs Abs: 727 cells/uL — ABNORMAL LOW (ref 850–3900)
MCH: 29 pg (ref 27.0–33.0)
MCHC: 33.8 g/dL (ref 32.0–36.0)
MCV: 85.8 fL (ref 80.0–100.0)
MPV: 11.9 fL (ref 7.5–12.5)
Monocytes Relative: 7.6 %
Neutro Abs: 3413 cells/uL (ref 1500–7800)
Neutrophils Relative %: 74.2 %
Platelets: 109 10*3/uL — ABNORMAL LOW (ref 140–400)
RBC: 4.38 10*6/uL (ref 4.20–5.80)
RDW: 16.6 % — ABNORMAL HIGH (ref 11.0–15.0)
Total Lymphocyte: 15.8 %
WBC: 4.6 10*3/uL (ref 3.8–10.8)

## 2020-02-13 LAB — HIV-1 RNA QUANT-NO REFLEX-BLD
HIV 1 RNA Quant: <20 Copies/mL
HIV-1 RNA Quant, Log: <1.3 Log cps/mL

## 2020-02-13 LAB — RPR: RPR Ser Ql: NONREACTIVE

## 2020-02-14 ENCOUNTER — Telehealth: Payer: Self-pay | Admitting: Family Medicine

## 2020-02-14 NOTE — Telephone Encounter (Signed)
PAtient needs Rx refilled  ZOVIRAX 5 % Wants a bigger tube if possible  amitriptyline (ELAVIL) 100 MG tablet    [id number] - [place], [name] - [address] AT [address] Phone:  [phone]  Fax:  [phone]

## 2020-02-15 ENCOUNTER — Ambulatory Visit: Payer: Medicaid Other

## 2020-02-15 NOTE — Telephone Encounter (Signed)
Needs appointment

## 2020-02-20 ENCOUNTER — Telehealth: Payer: Self-pay

## 2020-02-20 NOTE — Telephone Encounter (Signed)
Tried to contact patient to cancel appointment for 02/28/20. This appointment is not needed due to seeing him on 01/30/20 as originally scheduled. A MyChart message has been sent to notify the patient.

## 2020-02-28 ENCOUNTER — Ambulatory Visit: Payer: Medicaid Other | Admitting: Physician Assistant

## 2020-03-14 ENCOUNTER — Ambulatory Visit: Payer: Medicaid Other | Admitting: Internal Medicine

## 2020-03-25 ENCOUNTER — Telehealth: Payer: Self-pay | Admitting: *Deleted

## 2020-03-25 NOTE — Telephone Encounter (Signed)
Relayed to [facility], faxed note signed by [facility] for correct regimen of 1 Pifeltro daily + 1 Descovy daily. NO stribild.

## 2020-03-25 NOTE — Telephone Encounter (Signed)
Patient recently hospitalized at [facility]. He was discharged 03/24/20 on [place].  [place] at [facility] needs to know if Stribild is accurate as this will cost the facility $3000 if it is wrong. Please advise on current regimen. Unclear if patient actually received all 3 while hospitalized at [facility]. Landis Gandy, RN

## 2020-03-25 NOTE — Telephone Encounter (Signed)
Correct regimen should be Pifeltro + Descovy. No Stribild.. not sure where they got that from.

## 2020-03-26 NOTE — Telephone Encounter (Signed)
That's crazy

## 2020-04-01 ENCOUNTER — Encounter: Payer: Self-pay | Admitting: Internal Medicine

## 2020-04-01 ENCOUNTER — Telehealth: Payer: Self-pay | Admitting: Internal Medicine

## 2020-04-01 ENCOUNTER — Ambulatory Visit (INDEPENDENT_AMBULATORY_CARE_PROVIDER_SITE_OTHER): Payer: Medicaid Other | Admitting: Internal Medicine

## 2020-04-01 VITALS — BP 128/70 | HR 70 | Ht 72.0 in | Wt 328.0 lb

## 2020-04-01 DIAGNOSIS — K7290 Hepatic failure, unspecified without coma: Secondary | ICD-10-CM

## 2020-04-01 DIAGNOSIS — K652 Spontaneous bacterial peritonitis: Secondary | ICD-10-CM

## 2020-04-01 DIAGNOSIS — K7682 Hepatic encephalopathy: Secondary | ICD-10-CM

## 2020-04-01 DIAGNOSIS — K7031 Alcoholic cirrhosis of liver with ascites: Secondary | ICD-10-CM | POA: Diagnosis not present

## 2020-04-01 DIAGNOSIS — I8511 Secondary esophageal varices with bleeding: Secondary | ICD-10-CM

## 2020-04-01 MED ORDER — CIPROFLOXACIN HCL 500 MG PO TABS: 500.0000 mg | ORAL_TABLET | Freq: Every day | ORAL | 3 refills | Status: AC

## 2020-04-01 NOTE — Patient Instructions (Signed)
We will contact you in January 2022 about setting up an EGD.   We are providing you with a printed rx today for Cipro to take daily to prevent SBP.  I appreciate the opportunity to care for you. Silvano Rusk, MD, [facility]

## 2020-04-01 NOTE — Telephone Encounter (Signed)
[name] [name]
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
## 2020-04-02 NOTE — Telephone Encounter (Signed)
I called [facility] in [place] at [phone] and spoke with Reginal Lutes who is going to pass the information on to [place] the NP there. Brittany,RN said he is almost done with his BID cipro and then they will start the daily cipro to prevent SBP.

## 2020-04-02 NOTE — Telephone Encounter (Signed)
Start daily after finishes the bid cipro

## 2020-04-07 ENCOUNTER — Other Ambulatory Visit: Payer: Self-pay | Admitting: Internal Medicine

## 2020-04-07 DIAGNOSIS — E7220 Disorder of urea cycle metabolism, unspecified: Secondary | ICD-10-CM

## 2020-04-17 ENCOUNTER — Telehealth: Payer: Self-pay | Admitting: *Deleted

## 2020-04-17 DIAGNOSIS — B20 Human immunodeficiency virus [HIV] disease: Secondary | ICD-10-CM

## 2020-04-17 MED ORDER — DORAVIRINE 100 MG PO TABS: 100.0000 mg | ORAL_TABLET | Freq: Every day | ORAL | 5 refills | Status: DC

## 2020-04-17 MED ORDER — DESCOVY 200-25 MG PO TABS: 1.0000 | ORAL_TABLET | Freq: Every day | ORAL | 5 refills | Status: DC

## 2020-04-17 NOTE — Telephone Encounter (Signed)
Patient calling from nursing home, states he thinks he is going home today and he would like refills of his descovy/pifeltro sent to his local [facility] in [place].  RN advised him to ask for any unused medication at discharge from skilled facility, sent prescriptions to [facility].   Patient sounded confused on the phone. RN asked him to let us know when he does discharge. RN let [facility] know the situation, in case they are not able to process the refills due to a recent refill at the skilled facility. RN unable to contact the skilled facility - phone call not answered at [phone]. Landis Gandy, RN

## 2020-04-18 NOTE — Telephone Encounter (Signed)
Ok to fill rx at [facility] I hate how unavailable SNF are

## 2020-05-01 ENCOUNTER — Ambulatory Visit: Payer: Medicaid Other | Admitting: Adult Health

## 2020-05-01 ENCOUNTER — Telehealth: Payer: Self-pay | Admitting: *Deleted

## 2020-05-01 NOTE — Telephone Encounter (Signed)
Is there a CD4 threshold for shingles vaccine?I would think he should be able to get it esp the inactivated one
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
## 2020-05-13 ENCOUNTER — Telehealth: Payer: Self-pay

## 2020-05-13 NOTE — Telephone Encounter (Signed)
-----   Message from Truman Hayward, MD sent at 05/13/2020 11:18 AM EST ----- Regarding: I want Briar to come in at 68 or close then if possible rather than 1130 and then block 1130 appt I scheduled him at 930 (in addition to the 1230 he alrady has). I cannnot reach him on phone to ask him to come in to the earlier spot I booked him intto. Is mychart way to communicate w him?

## 2020-05-13 NOTE — Telephone Encounter (Signed)
Called SNF to cancel appointment for 12/1. Appointment has been rescheduled for 12/23. Spoke with Ladona Mow, RN who will update transportation and patient.  RN did confirm appointment change before ending call.

## 2020-05-13 NOTE — Telephone Encounter (Signed)
Can we reschedule [name] w me week before holidays ?

## 2020-05-13 NOTE — Telephone Encounter (Signed)
Patient is currently at [facility]. Spoke with RN who states facility has multiple residents with 9:30 appointment slot. They are unable to provide transportation for patient at this time.  Facility requesting to reschedule appointment or have appointment done as a virtual visit. Will forward message to MD to advise on appointment. Rn is requesting office call back with decision at [phone].

## 2020-05-14 ENCOUNTER — Telehealth: Payer: Self-pay

## 2020-05-14 ENCOUNTER — Ambulatory Visit: Payer: Medicaid Other | Admitting: Infectious Disease

## 2020-05-14 NOTE — Telephone Encounter (Signed)
Patient called requesting information about the HIV pill that makes it impossible to transmit HIV sexually. RN discussed U=U with patient and informed him that according to his labs he is currently undetectable as of 02/11/20. Patient verbalized understanding and has no further questions.    Patient also reports right foot pain, states he is having to use a walker to get around. Reports the pain as being muscular. Inquiring about podiatry referral. RN advised patient to reach out to PCP regarding podiatry referral. Patient has their number and says he will give them a call.   Beryle Flock, RN

## 2020-05-14 NOTE — Telephone Encounter (Signed)
Excellent thanks so much! Can we block the 2 slots he is scheduled for today?

## 2020-05-20 ENCOUNTER — Ambulatory Visit: Payer: Medicaid Other | Admitting: Family Medicine

## 2020-05-26 ENCOUNTER — Ambulatory Visit: Payer: Medicaid Other | Admitting: Family Medicine

## 2020-06-03 ENCOUNTER — Ambulatory Visit: Payer: Medicaid Other | Admitting: Physician Assistant

## 2020-06-05 ENCOUNTER — Ambulatory Visit: Payer: Medicaid Other | Admitting: Infectious Disease

## 2020-06-25 ENCOUNTER — Ambulatory Visit (INDEPENDENT_AMBULATORY_CARE_PROVIDER_SITE_OTHER): Payer: Medicaid Other | Admitting: Infectious Disease

## 2020-06-25 ENCOUNTER — Encounter: Payer: Self-pay | Admitting: Infectious Disease

## 2020-06-25 VITALS — BP 137/89 | HR 72 | Temp 98.0°F | Wt 324.0 lb

## 2020-06-25 DIAGNOSIS — B20 Human immunodeficiency virus [HIV] disease: Secondary | ICD-10-CM | POA: Diagnosis present

## 2020-06-25 DIAGNOSIS — K652 Spontaneous bacterial peritonitis: Secondary | ICD-10-CM | POA: Diagnosis not present

## 2020-06-25 DIAGNOSIS — F1020 Alcohol dependence, uncomplicated: Secondary | ICD-10-CM

## 2020-06-25 DIAGNOSIS — E66813 Obesity, class 3: Secondary | ICD-10-CM

## 2020-06-25 DIAGNOSIS — D61818 Other pancytopenia: Secondary | ICD-10-CM
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
## 2020-06-30 LAB — CBC WITH DIFFERENTIAL/PLATELET
Absolute Monocytes: 383 cells/uL (ref 200–950)
Basophils Absolute: 30 cells/uL (ref 0–200)
Basophils Relative: 0.7 %
Eosinophils Absolute: 142 cells/uL (ref 15–500)
Eosinophils Relative: 3.3 %
HCT: 38.3 % — ABNORMAL LOW (ref 38.5–50.0)
Hemoglobin: 12.7 g/dL — ABNORMAL LOW (ref 13.2–17.1)
Lymphs Abs: 1114 cells/uL (ref 850–3900)
MCH: 27.7 pg (ref 27.0–33.0)
MCHC: 33.2 g/dL (ref 32.0–36.0)
MCV: 83.4 fL (ref 80.0–100.0)
MPV: 11.4 fL (ref 7.5–12.5)
Monocytes Relative: 8.9 %
Neutro Abs: 2632 cells/uL (ref 1500–7800)
Neutrophils Relative %: 61.2 %
Platelets: 104 10*3/uL — ABNORMAL LOW (ref 140–400)
RBC: 4.59 10*6/uL (ref 4.20–5.80)
RDW: 14.6 % (ref 11.0–15.0)
Total Lymphocyte: 25.9 %
WBC: 4.3 10*3/uL (ref 3.8–10.8)

## 2020-06-30 LAB — COMPLETE METABOLIC PANEL WITH GFR
AG Ratio: 1.6 (calc) (ref 1.0–2.5)
ALT: 15 U/L (ref 9–46)
AST: 22 U/L (ref 10–35)
Albumin: 4.1 g/dL (ref 3.6–5.1)
Alkaline phosphatase (APISO): 153 U/L — ABNORMAL HIGH (ref 35–144)
BUN: 18 mg/dL (ref 7–25)
CO2: 25 mmol/L (ref 20–32)
Calcium: 9.3 mg/dL (ref 8.6–10.3)
Chloride: 105 mmol/L (ref 98–110)
Creat: 1.05 mg/dL (ref 0.70–1.33)
GFR, Est African American: 92 mL/min/{1.73_m2} (ref >=60)
GFR, Est Non African American: 80 mL/min/{1.73_m2} (ref >=60)
Globulin: 2.5 g/dL (calc) (ref 1.9–3.7)
Glucose, Bld: 72 mg/dL (ref 65–99)
Potassium: 3.9 mmol/L (ref 3.5–5.3)
Sodium: 140 mmol/L (ref 135–146)
Total Bilirubin: 1.1 mg/dL (ref 0.2–1.2)
Total Protein: 6.6 g/dL (ref 6.1–8.1)

## 2020-06-30 LAB — HIV-1 RNA QUANT-NO REFLEX-BLD
HIV 1 RNA Quant: <20 Copies/mL
HIV-1 RNA Quant, Log: <1.3 Log cps/mL

## 2020-06-30 LAB — RPR: RPR Ser Ql: NONREACTIVE

## 2020-07-08 ENCOUNTER — Ambulatory Visit: Payer: Medicaid Other | Admitting: Family Medicine

## 2020-07-21 ENCOUNTER — Encounter: Payer: Self-pay | Admitting: Internal Medicine

## 2020-07-23 ENCOUNTER — Ambulatory Visit (AMBULATORY_SURGERY_CENTER): Payer: Medicaid Other | Admitting: *Deleted

## 2020-07-23 ENCOUNTER — Other Ambulatory Visit: Payer: Self-pay

## 2020-07-23 VITALS — Ht 72.0 in | Wt 315.0 lb

## 2020-07-23 DIAGNOSIS — I8511 Secondary esophageal varices with bleeding: Secondary | ICD-10-CM

## 2020-07-23 DIAGNOSIS — K7031 Alcoholic cirrhosis of liver with ascites: Secondary | ICD-10-CM

## 2020-07-23 NOTE — Progress Notes (Signed)
Patient's pre-visit was done today over the phone with the patient due to COVID-19 pandemic. Name,DOB and address verified. Insurance verified. Patient denies any allergies to Eggs and Soy. Patient denies any problems with anesthesia/sedation. Patient denies taking diet pills or blood thinners. Packet of Prep instructions mailed to patient including a copy of a consent form -pt is aware. *Patient understands to call us back with any questions or concerns. COVID-19 vaccines completed x3, per patient. Patient is aware of our care-partner policy and [id number]-[id number] safety protocol.

## 2020-07-25 ENCOUNTER — Ambulatory Visit: Payer: Medicaid Other | Admitting: Family Medicine

## 2020-08-04 ENCOUNTER — Ambulatory Visit: Payer: Medicaid Other | Admitting: Family Medicine

## 2020-08-06 ENCOUNTER — Encounter: Payer: Medicaid Other | Admitting: Internal Medicine

## 2020-09-22 ENCOUNTER — Ambulatory Visit: Payer: Medicaid Other | Admitting: Family Medicine

## 2020-10-13 ENCOUNTER — Ambulatory Visit: Payer: Medicaid Other | Admitting: Family Medicine

## 2020-10-13 ENCOUNTER — Encounter: Payer: Self-pay | Admitting: Family Medicine

## 2020-10-22 ENCOUNTER — Other Ambulatory Visit: Payer: Medicaid Other

## 2020-10-23 ENCOUNTER — Other Ambulatory Visit: Payer: Medicaid Other

## 2020-10-23 ENCOUNTER — Telehealth: Payer: Self-pay
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
## 2020-10-30 ENCOUNTER — Other Ambulatory Visit: Payer: Medicaid Other

## 2020-11-05 ENCOUNTER — Telehealth: Payer: Self-pay

## 2020-11-05 ENCOUNTER — Telehealth (INDEPENDENT_AMBULATORY_CARE_PROVIDER_SITE_OTHER): Payer: Medicaid Other | Admitting: Infectious Disease

## 2020-11-05 ENCOUNTER — Other Ambulatory Visit: Payer: Self-pay

## 2020-11-05 DIAGNOSIS — B20 Human immunodeficiency virus [HIV] disease: Secondary | ICD-10-CM | POA: Diagnosis not present

## 2020-11-05 DIAGNOSIS — F331 Major depressive disorder, recurrent, moderate: Secondary | ICD-10-CM

## 2020-11-05 DIAGNOSIS — I10 Essential (primary) hypertension: Secondary | ICD-10-CM

## 2020-11-05 DIAGNOSIS — K7031 Alcoholic cirrhosis of liver with ascites: Secondary | ICD-10-CM | POA: Diagnosis not present

## 2020-11-05 MED ORDER — DESCOVY 200-25 MG PO TABS: 1.0000 | ORAL_TABLET | Freq: Every day | ORAL | 11 refills | Status: DC

## 2020-11-05 MED ORDER — DORAVIRINE 100 MG PO TABS: 100.0000 mg | ORAL_TABLET | Freq: Every day | ORAL | 11 refills | Status: DC

## 2020-11-05 NOTE — Telephone Encounter (Signed)
Patient called, the facility he is staying at is unable to bring him to his appointment today. RN offered video visit, he states he does not have a way to connect to video and would prefer a phone visit.   [phone] ask to speak to room 109A  Beryle Flock, RN
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
## 2020-11-21 ENCOUNTER — Ambulatory Visit: Payer: Medicaid Other | Admitting: Infectious Disease

## 2020-12-18 ENCOUNTER — Ambulatory Visit (INDEPENDENT_AMBULATORY_CARE_PROVIDER_SITE_OTHER): Payer: Medicaid Other | Admitting: Infectious Disease

## 2020-12-18 ENCOUNTER — Other Ambulatory Visit: Payer: Self-pay

## 2020-12-18 ENCOUNTER — Encounter: Payer: Self-pay | Admitting: Infectious Disease

## 2020-12-18 ENCOUNTER — Other Ambulatory Visit (HOSPITAL_COMMUNITY)
Admission: RE | Admit: 2020-12-18 | Discharge: 2020-12-18 | Disposition: A | Discharge status: Home / Self Care | Payer: Medicaid Other | Source: Ambulatory Visit | Attending: Infectious Disease | Admitting: Infectious Disease

## 2020-12-18 VITALS — BP 124/70 | HR 78 | Wt 342.0 lb

## 2020-12-18 DIAGNOSIS — B20 Human immunodeficiency virus [HIV] disease: Secondary | ICD-10-CM

## 2020-12-18 DIAGNOSIS — K7031 Alcoholic cirrhosis of liver with ascites: Secondary | ICD-10-CM | POA: Diagnosis not present

## 2020-12-18 DIAGNOSIS — D61818 Other pancytopenia: Secondary | ICD-10-CM | POA: Diagnosis not present

## 2020-12-18 DIAGNOSIS — K7290 Hepatic failure, unspecified without coma: Secondary | ICD-10-CM | POA: Diagnosis not present

## 2020-12-18 DIAGNOSIS — T7611XD Adult physical abuse, suspected, subsequent encounter: Secondary | ICD-10-CM

## 2020-12-18 DIAGNOSIS — F411 Generalized anxiety disorder: Secondary | ICD-10-CM | POA: Diagnosis not present

## 2020-12-18 DIAGNOSIS — K7682 Hepatic encephalopathy: Secondary | ICD-10-CM

## 2020-12-18 DIAGNOSIS — F332 Major depressive disorder, recurrent severe without psychotic features: Secondary | ICD-10-CM

## 2020-12-18 DIAGNOSIS — E512 Wernicke's encephalopathy: Secondary | ICD-10-CM | POA: Diagnosis not present
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
## 2020-12-19 LAB — URINE CYTOLOGY ANCILLARY ONLY
Chlamydia: NEGATIVE
Comment: NEGATIVE
Comment: NORMAL
Neisseria Gonorrhea: NEGATIVE

## 2020-12-19 LAB — T-HELPER CELL (CD4) - (RCID CLINIC ONLY)
CD4 % Helper T Cell: 24 % — ABNORMAL LOW (ref 33–65)
CD4 T Cell Abs: 349 /uL — ABNORMAL LOW (ref 400–1790)

## 2020-12-20 LAB — RPR: RPR Ser Ql: NONREACTIVE

## 2020-12-20 LAB — COMPLETE METABOLIC PANEL WITH GFR
AG Ratio: 1.7 (calc) (ref 1.0–2.5)
ALT: 17 U/L (ref 9–46)
AST: 25 U/L (ref 10–35)
Albumin: 4.1 g/dL (ref 3.6–5.1)
Alkaline phosphatase (APISO): 127 U/L (ref 35–144)
BUN: 10 mg/dL (ref 7–25)
CO2: 23 mmol/L (ref 20–32)
Calcium: 8.9 mg/dL (ref 8.6–10.3)
Chloride: 105 mmol/L (ref 98–110)
Creat: 1.05 mg/dL (ref 0.70–1.33)
GFR, Est African American: 92 mL/min/{1.73_m2} (ref >=60)
GFR, Est Non African American: 79 mL/min/{1.73_m2} (ref >=60)
Globulin: 2.4 g/dL (calc) (ref 1.9–3.7)
Glucose, Bld: 78 mg/dL (ref 65–99)
Potassium: 3.9 mmol/L (ref 3.5–5.3)
Sodium: 139 mmol/L (ref 135–146)
Total Bilirubin: 1.1 mg/dL (ref 0.2–1.2)
Total Protein: 6.5 g/dL (ref 6.1–8.1)

## 2020-12-20 LAB — HIV-1 RNA QUANT-NO REFLEX-BLD
HIV 1 RNA Quant: NOT DETECTED Copies/mL
HIV-1 RNA Quant, Log: NOT DETECTED Log cps/mL

## 2020-12-20 LAB — CBC WITH DIFFERENTIAL/PLATELET
Absolute Monocytes: 461 cells/uL (ref 200–950)
Basophils Absolute: 38 cells/uL (ref 0–200)
Basophils Relative: 0.8 %
Eosinophils Absolute: 149 cells/uL (ref 15–500)
Eosinophils Relative: 3.1 %
HCT: 40 % (ref 38.5–50.0)
Hemoglobin: 12.5 g/dL — ABNORMAL LOW (ref 13.2–17.1)
Lymphs Abs: 1469 cells/uL (ref 850–3900)
MCH: 25 pg — ABNORMAL LOW (ref 27.0–33.0)
MCHC: 31.3 g/dL — ABNORMAL LOW (ref 32.0–36.0)
MCV: 80 fL (ref 80.0–100.0)
MPV: 10.7 fL (ref 7.5–12.5)
Monocytes Relative: 9.6 %
Neutro Abs: 2683 cells/uL (ref 1500–7800)
Neutrophils Relative %: 55.9 %
Platelets: 104 10*3/uL — ABNORMAL LOW (ref 140–400)
RBC: 5 10*6/uL (ref 4.20–5.80)
RDW: 15.3 % — ABNORMAL HIGH (ref 11.0–15.0)
Total Lymphocyte: 30.6 %
WBC: 4.8 10*3/uL (ref 3.8–10.8)

## 2020-12-20 LAB — LIPID PANEL
Cholesterol: 149 mg/dL (ref <200)
HDL: 59 mg/dL (ref >=40)
LDL Cholesterol (Calc): 73 mg/dL (calc)
Non-HDL Cholesterol (Calc): 90 mg/dL (calc) (ref <130)
Total CHOL/HDL Ratio: 2.5 (calc) (ref <5.0)
Triglycerides: 89 mg/dL (ref <150)

## 2020-12-20 LAB — AMMONIA: Ammonia: 60 umol/L (ref <=72)

## 2020-12-22 NOTE — Progress Notes (Signed)
I left a detailed message with the transportation dept where he lives to call me back and we will set up an August appointment with Dr Carlean Purl.

## 2020-12-23 NOTE — Progress Notes (Signed)
Appointment has been made for 01/22/21 at 11:30am via Helene Kelp at [facility] [phone].

## 2021-01-22 ENCOUNTER — Ambulatory Visit: Payer: Medicaid Other | Admitting: Internal Medicine

## 2021-01-22 ENCOUNTER — Other Ambulatory Visit (INDEPENDENT_AMBULATORY_CARE_PROVIDER_SITE_OTHER): Payer: Medicaid Other

## 2021-01-22 ENCOUNTER — Ambulatory Visit (INDEPENDENT_AMBULATORY_CARE_PROVIDER_SITE_OTHER): Payer: Medicaid Other | Admitting: Internal Medicine

## 2021-01-22 ENCOUNTER — Encounter: Payer: Self-pay | Admitting: Internal Medicine

## 2021-01-22 VITALS — BP 124/70 | HR 78 | Ht 72.0 in | Wt 342.2 lb

## 2021-01-22 DIAGNOSIS — K7031 Alcoholic cirrhosis of liver with ascites: Secondary | ICD-10-CM | POA: Diagnosis not present

## 2021-01-22 DIAGNOSIS — K766 Portal hypertension: Secondary | ICD-10-CM | POA: Diagnosis not present

## 2021-01-22 DIAGNOSIS — I8511 Secondary esophageal varices with bleeding: Secondary | ICD-10-CM | POA: Diagnosis not present

## 2021-01-22 DIAGNOSIS — K3189 Other diseases of stomach and duodenum: Secondary | ICD-10-CM

## 2021-01-22 DIAGNOSIS — R161 Splenomegaly, not elsewhere classified: Secondary | ICD-10-CM | POA: Diagnosis not present

## 2021-01-22 DIAGNOSIS — K7682 Hepatic encephalopathy: Secondary | ICD-10-CM

## 2021-01-22 DIAGNOSIS — K7290 Hepatic failure, unspecified without coma: Secondary | ICD-10-CM

## 2021-01-22 LAB — PROTIME-INR
INR: 1.2 ratio — ABNORMAL HIGH (ref 0.8–1.0)
Prothrombin Time: 13.1 s (ref 9.6–13.1)

## 2021-01-22 NOTE — Patient Instructions (Signed)
Your provider has requested that you go to the basement level for lab work before leaving today. Press "B" on the elevator. The lab is located at the first door on the left as you exit the elevator.  Due to recent changes in healthcare laws, you may see the results of your imaging and laboratory studies on MyChart before your provider has had a chance to review them.  We understand that in some cases there may be results that are confusing or concerning to you. Not all laboratory results come back in the same time frame and the provider may be waiting for multiple results in order to interpret others.  Please give [place] 48 hours in order for your provider to thoroughly review all the results before contacting the office for clarification of your results.   You have been scheduled for an endoscopy. Please follow written instructions given to you at your visit today. If you use inhalers (even only as needed), please bring them with you on the day of your procedure.  You have been scheduled for an abdominal ultrasound at [facility], on 01/30/21 at 11:00am. Please arrive 15 minutes prior to your appointment for registration. Make certain not to have anything to eat or drink 6 hours prior to your appointment. Should you need to reschedule your appointment, please contact radiology at [phone]. This test typically takes about 30 minutes to perform.  Reduce your carb intake.  I appreciate the opportunity to care for you. Ronney Lion, [facility]
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
## 2021-01-23 LAB — AFP TUMOR MARKER: AFP-Tumor Marker: 2.5 ng/mL (ref <6.1)

## 2021-01-27 ENCOUNTER — Encounter: Payer: Self-pay | Admitting: Internal Medicine

## 2021-01-30 ENCOUNTER — Ambulatory Visit (HOSPITAL_BASED_OUTPATIENT_CLINIC_OR_DEPARTMENT_OTHER): Admission: RE | Admit: 2021-01-30 | Payer: Medicaid Other | Source: Ambulatory Visit

## 2021-02-19 ENCOUNTER — Telehealth: Payer: Self-pay | Admitting: Internal Medicine

## 2021-02-19 NOTE — Telephone Encounter (Signed)
Inbound call from patient. States in his last OV he was put on a special diet and want to discuss that with PJ. Best contact number [phone]

## 2021-02-19 NOTE — Telephone Encounter (Signed)
I spoke to [name] and we went back over the diet information from his last visit with Dr Carlean Purl. He said he is trying to walk and exercise as he wants to lose weight. He said he gets winded. I told him he should tell them at the facility he lives so either the Dr there or a PCP could exam him. He said his PCP Betty Martinique has dismissed him due to missing so many appointments. He has a phone # to call to get a new one he said. He has an October 13 th appointment for his EGD at [facility].

## 2021-03-17 ENCOUNTER — Encounter (HOSPITAL_COMMUNITY): Payer: Self-pay | Admitting: Internal Medicine

## 2021-03-23 ENCOUNTER — Ambulatory Visit: Payer: Medicaid Other | Admitting: Infectious Disease

## 2021-03-25 NOTE — Progress Notes (Signed)
Pre op call for endo procedure 10/13 done with Nurse Marcie Bal from pt facility [facility]) . I faxed information, but did not receive anything back. Follow up call- the nurse I spoke to stated the nurse who usually takes care of this is out. However, she did find the info I faxed over.   I notified her: -He will need to bring an updated MAR, and transportation info. - His arrival time at [facility] is 0800 am - Can take his usual morning meds with water except his lasix and lactulose - NPO after midnight - Her number if there's questions is [phone]  Ladona Mow

## 2021-03-26 ENCOUNTER — Other Ambulatory Visit: Payer: Self-pay

## 2021-03-26 ENCOUNTER — Encounter (HOSPITAL_COMMUNITY)
Admission: RE | Disposition: A | Discharge status: Home / Self Care | Payer: Self-pay | Source: Home / Self Care | Attending: Internal Medicine

## 2021-03-26 ENCOUNTER — Ambulatory Visit (HOSPITAL_COMMUNITY)
Admission: RE | Admit: 2021-03-26 | Discharge: 2021-03-26 | Disposition: A | Discharge status: Home / Self Care | Payer: Medicaid Other | Attending: Internal Medicine | Admitting: Internal Medicine

## 2021-03-26 ENCOUNTER — Ambulatory Visit (HOSPITAL_COMMUNITY): Payer: Medicaid Other | Admitting: Certified Registered Nurse Anesthetist

## 2021-03-26 ENCOUNTER — Encounter (HOSPITAL_COMMUNITY): Payer: Self-pay | Admitting: Internal Medicine

## 2021-03-26 DIAGNOSIS — Z6841 Body Mass Index (BMI) 40.0 and over, adult: Secondary | ICD-10-CM | POA: Insufficient documentation

## 2021-03-26 DIAGNOSIS — I8511 Secondary esophageal varices with bleeding: Secondary | ICD-10-CM

## 2021-03-26 DIAGNOSIS — Z87891 Personal history of nicotine dependence: Secondary | ICD-10-CM | POA: Insufficient documentation

## 2021-03-26 DIAGNOSIS — Z09 Encounter for follow-up examination after completed treatment for conditions other than malignant neoplasm: Secondary | ICD-10-CM | POA: Insufficient documentation

## 2021-03-26 DIAGNOSIS — K7031 Alcoholic cirrhosis of liver with ascites: Secondary | ICD-10-CM

## 2021-03-26 DIAGNOSIS — K2289 Other specified disease of esophagus: Secondary | ICD-10-CM | POA: Insufficient documentation

## 2021-03-26 DIAGNOSIS — I8510 Secondary esophageal varices without bleeding: Secondary | ICD-10-CM | POA: Diagnosis not present

## 2021-03-26 HISTORY — PX: ESOPHAGOGASTRODUODENOSCOPY (EGD) WITH PROPOFOL: SHX5813

## 2021-03-26 SURGERY — ESOPHAGOGASTRODUODENOSCOPY (EGD) WITH PROPOFOL
Anesthesia: Monitor Anesthesia Care

## 2021-03-26 MED ORDER — LACTATED RINGERS IV SOLN
INTRAVENOUS | Status: AC | PRN
  Administered 2021-03-26: 10 mL/h via INTRAVENOUS

## 2021-03-26 MED ORDER — SODIUM CHLORIDE 0.9 % IV SOLN: INTRAVENOUS | Status: DC

## 2021-03-26 MED ORDER — PROPOFOL 500 MG/50ML IV EMUL
INTRAVENOUS | Status: DC | PRN
  Administered 2021-03-26: 150 ug/kg/min via INTRAVENOUS

## 2021-03-26 SURGICAL SUPPLY — 15 items

## 2021-03-26 NOTE — Anesthesia Postprocedure Evaluation (Addendum)
Anesthesia Post Note  Patient: [name]  Procedure(s) Performed: ESOPHAGOGASTRODUODENOSCOPY (EGD) WITH PROPOFOL     Patient location during evaluation: PACU Anesthesia Type: MAC Level of consciousness: awake and alert Pain management: pain level controlled Vital Signs Assessment: post-procedure vital signs reviewed and stable Respiratory status: spontaneous breathing, nonlabored ventilation and respiratory function stable Cardiovascular status: blood pressure returned to baseline and stable Postop Assessment: no apparent nausea or vomiting Anesthetic complications: no   No notable events documented.  Last Vitals:  Vitals:   03/26/21 1010 03/26/21 1024  BP: (S) (!) 195/104 (!) 150/63  Pulse: 78 73  Resp: 17 17  Temp:    SpO2: 96% 99%    Last Pain:  Vitals:   03/26/21 1024  TempSrc:   PainSc: 0-No pain                 Pervis Hocking

## 2021-03-26 NOTE — Transfer of Care (Signed)
Immediate Anesthesia Transfer of Care Note  Patient: [name]  Procedure(s) Performed: Procedure(s): ESOPHAGOGASTRODUODENOSCOPY (EGD) WITH PROPOFOL (N/A)  Patient Location: PACU and Endoscopy Unit  Anesthesia Type:MAC  Level of Consciousness: awake, alert  and oriented  Airway & Oxygen Therapy: Patient Spontanous Breathing and Patient connected to nasal cannula oxygen  Post-op Assessment: Report given to RN and Post -op Vital signs reviewed and stable  Post vital signs: Reviewed and stable  Last Vitals:  Vitals:   03/26/21 0901  BP: (!) 177/76  Pulse: 78  Resp: (!) 25  Temp: 36.9 C  SpO2: 45%    Complications: No apparent anesthesia complications

## 2021-03-26 NOTE — Op Note (Signed)
[facility] Patient Name: [name] Procedure Date: 03/26/2021 MRN: [id number] Attending MD: Gatha Mayer , MD Date of Birth: 25-Jan-1966 CSN: [id number] Age: 55 Admit Type: Outpatient Procedure:                Upper GI endoscopy Indications:              Esophageal varices, Follow-up of esophageal                            varices, For therapy of esophageal varices Providers:                Gatha Mayer, MD, Particia Nearing, RN, Luan Moore, Technician, Cherylynn Ridges, Technician,                            Eliberto Ivory Referring MD:              Medicines:                Propofol per Anesthesia, Monitored Anesthesia Care Complications:            No immediate complications. Estimated Blood Loss:     Estimated blood loss: none. Procedure:                Pre-Anesthesia Assessment:                           - Prior to the procedure, a History and Physical                            was performed, and patient medications and                            allergies were reviewed. The patient's tolerance of                            previous anesthesia was also reviewed. The risks                            and benefits of the procedure and the sedation                            options and risks were discussed with the patient.                            All questions were answered, and informed consent                            was obtained. Prior Anticoagulants: The patient has                            taken no previous anticoagulant or antiplatelet  agents. ASA Grade Assessment: III - A patient with                            severe systemic disease. After reviewing the risks                            and benefits, the patient was deemed in                            satisfactory condition to undergo the procedure.                           After obtaining informed consent, the endoscope was                             passed under direct vision. Throughout the                            procedure, the patient's blood pressure, pulse, and                            oxygen saturations were monitored continuously. The                            [id number]-H190 ([id number]) Olympus endoscope was introduced                            through the mouth, and advanced to the second part                            of duodenum. The upper GI endoscopy was                            accomplished without difficulty. The patient                            tolerated the procedure well. Scope In: Scope Out: Findings:      Grade I, small (< 5 mm) varices were found in the lower third of the       esophagus. Estimated blood loss: none.      The exam was otherwise without abnormality except for incidental       irregular Z line and tiny antral submucosal bulge      The cardia and gastric fundus were normal on retroflexion. Impression:               - Grade I and small (< 5 mm) esophageal varices.                           - The examination was otherwise normal except for                            incidental irregular Z line and tiny antral  submucosal bulge                           - No specimens collected. Moderate Sedation:      Not Applicable - Patient had care per Anesthesia. Recommendation:           - Patient has a contact number available for                            emergencies. The signs and symptoms of potential                            delayed complications were discussed with the                            patient. Return to normal activities tomorrow.                            Written discharge instructions were provided to the                            patient.                           - Resume previous diet.                           - Continue present medications.                           - Repeat upper endoscopy in 2 years for                             surveillance.                           - Return to GI clinic in 4 months                           needs EGd recall 2 yeasr 03/2023 and colonoscopy                            recall 7 /2023 Procedure Code(s):        --- Professional ---                           [phone], Esophagogastroduodenoscopy, flexible,                            transoral; diagnostic, including collection of                            specimen(s) by brushing or washing, when performed                            (separate procedure) Diagnosis Code(s):        --- Professional ---  I85.00, Esophageal varices without bleeding CPT copyright 2019 American Medical Association. All rights reserved. The codes documented in this report are preliminary and upon coder review may  be revised to meet current compliance requirements. Gatha Mayer, MD 03/26/2021 10:13:49 AM This report has been signed electronically. Number of Addenda: 0
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
## 2021-03-26 NOTE — Anesthesia Preprocedure Evaluation (Addendum)
Anesthesia Evaluation  Patient identified by MRN, date of birth, ID band Patient awake    Reviewed: Allergy & Precautions, NPO status , Patient's Chart, lab work & pertinent test results  Airway Mallampati: III  TM Distance: >3 FB Neck ROM: Full    Dental  (+) Dental Advisory Given   Pulmonary former smoker,  Quit smoking 2015 Has never had sleep study   Pulmonary exam normal breath sounds clear to auscultation       Cardiovascular (-) hypertension (not currently on meds, SBP 170s in preop)Normal cardiovascular exam Rhythm:Regular Rate:Normal     Neuro/Psych PSYCHIATRIC DISORDERS Anxiety Depression    GI/Hepatic PUD, GERD  Medicated and Controlled,(+) Cirrhosis     substance abuse  alcohol use, Portal hypertensive gastropathy Denies current alcohol, tobacco or drug use   Endo/Other  Morbid obesityBMI 46  Renal/GU   negative genitourinary   Musculoskeletal negative musculoskeletal ROS (+)   Abdominal (+) + obese,   Peds  Hematology Last hct in July 2022: 40   Anesthesia Other Findings   Reproductive/Obstetrics negative OB ROS                            Anesthesia Physical Anesthesia Plan  ASA: 3  Anesthesia Plan: MAC   Post-op Pain Management:    Induction:   PONV Risk Score and Plan: 2 and Propofol infusion and TIVA  Airway Management Planned: Natural Airway and Simple Face Mask  Additional Equipment: None  Intra-op Plan:   Post-operative Plan:   Informed Consent: I have reviewed the patients History and Physical, chart, labs and discussed the procedure including the risks, benefits and alternatives for the proposed anesthesia with the patient or authorized representative who has indicated his/her understanding and acceptance.     Dental advisory given  Plan Discussed with: CRNA  Anesthesia Plan Comments:        Anesthesia Quick Evaluation

## 2021-03-26 NOTE — Discharge Instructions (Addendum)
Things look the same - stable. No need to place rubber bands. The veins 9varices) have remained very small for years now.  Will plan to look again in 2 years or so.  Office visit in February - approximately.  I appreciate the opportunity to care for you. Carl Mayer, MD, FACG  YOU HAD AN ENDOSCOPIC PROCEDURE TODAY: Refer to the procedure report and other information in the discharge instructions given to you for any specific questions about what was found during the examination. If this information does not answer your questions, please call Dr. Celesta Aver office at [phone] to clarify.   YOU SHOULD EXPECT: Some feelings of bloating in the abdomen. Passage of more gas than usual. Walking can help get rid of the air that was put into your GI tract during the procedure and reduce the bloating. If you had a lower endoscopy (such as a colonoscopy or flexible sigmoidoscopy) you may notice spotting of blood in your stool or on the toilet paper. Some abdominal soreness may be present for a day or two, also.  DIET: Your first meal following the procedure should be a light meal and then it is ok to progress to your normal diet. A half-sandwich or bowl of soup is an example of a good first meal. Heavy or fried foods are harder to digest and may make you feel nauseous or bloated. Drink plenty of fluids but you should avoid alcoholic beverages for 24 hours.   ACTIVITY: Your care partner should take you home directly after the procedure. You should plan to take it easy, moving slowly for the rest of the day. You can resume normal activity the day after the procedure however YOU SHOULD NOT DRIVE, use power tools, machinery or perform tasks that involve climbing or major physical exertion for 24 hours (because of the sedation medicines used during the test).   SYMPTOMS TO REPORT IMMEDIATELY: A gastroenterologist can be reached at any hour. Please call [phone]  for any of the following symptoms:     Following upper endoscopy (EGD, EUS, ERCP, esophageal dilation) Vomiting of blood or coffee ground material  New, significant abdominal pain  New, significant chest pain or pain under the shoulder blades  Painful or persistently difficult swallowing  New shortness of breath  Black, tarry-looking or red, bloody stools

## 2021-03-27 ENCOUNTER — Encounter (HOSPITAL_COMMUNITY): Payer: Self-pay | Admitting: Internal Medicine

## 2021-04-02 ENCOUNTER — Other Ambulatory Visit (HOSPITAL_COMMUNITY)
Admission: RE | Admit: 2021-04-02 | Discharge: 2021-04-02 | Disposition: A | Discharge status: Home / Self Care | Payer: Medicaid Other | Source: Ambulatory Visit | Attending: Infectious Disease | Admitting: Infectious Disease

## 2021-04-02 ENCOUNTER — Ambulatory Visit (INDEPENDENT_AMBULATORY_CARE_PROVIDER_SITE_OTHER): Payer: Medicaid Other | Admitting: Infectious Disease

## 2021-04-02 ENCOUNTER — Encounter: Payer: Self-pay | Admitting: Infectious Disease

## 2021-04-02 ENCOUNTER — Other Ambulatory Visit: Payer: Self-pay

## 2021-04-02 ENCOUNTER — Ambulatory Visit (INDEPENDENT_AMBULATORY_CARE_PROVIDER_SITE_OTHER): Payer: Medicaid Other

## 2021-04-02 VITALS — BP 166/72 | HR 88 | Temp 97.9°F | Ht 72.0 in | Wt 347.0 lb

## 2021-04-02 DIAGNOSIS — D61818 Other pancytopenia: Secondary | ICD-10-CM | POA: Diagnosis not present

## 2021-04-02 DIAGNOSIS — T7611XD Adult physical abuse, suspected, subsequent encounter: Secondary | ICD-10-CM | POA: Diagnosis not present

## 2021-04-02 DIAGNOSIS — K7031 Alcoholic cirrhosis of liver with ascites: Secondary | ICD-10-CM

## 2021-04-02 DIAGNOSIS — B20 Human immunodeficiency virus [HIV] disease: Secondary | ICD-10-CM

## 2021-04-02 DIAGNOSIS — F332 Major depressive disorder, recurrent severe without psychotic features: Secondary | ICD-10-CM

## 2021-04-02 DIAGNOSIS — Z23 Encounter for immunization: Secondary | ICD-10-CM

## 2021-04-02 DIAGNOSIS — I8511 Secondary esophageal varices with bleeding: Secondary | ICD-10-CM

## 2021-04-02 NOTE — Progress Notes (Signed)
   Covid-19 Vaccination Clinic  Name:  ODA LANSDOWNE    MRN: [id number] DOB: [birth year]  04/02/2021  Mr. Purohit was observed post Covid-19 immunization for 15 minutes without incident. He was provided with Vaccine Information Sheet and instruction to access the V-Safe system.   Mr. Mullenbach was instructed to call 911 with any severe reactions post vaccine: Difficulty breathing  Swelling of face and throat  A fast heartbeat  A bad rash all over body  Dizziness and weakness   Immunizations Administered     Name Date Dose VIS Date Route   Pfizer Covid-19 Vaccine Bivalent Booster 04/02/2021 11:30 AM 0.3 mL 02/11/2021 Intramuscular   Manufacturer: [place]   Lot: [place]: [place], RN
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
## 2021-04-03 LAB — URINE CYTOLOGY ANCILLARY ONLY
Chlamydia: NEGATIVE
Comment: NEGATIVE
Comment: NORMAL
Neisseria Gonorrhea: NEGATIVE

## 2021-04-03 LAB — T-HELPER CELL (CD4) - (RCID CLINIC ONLY)
CD4 % Helper T Cell: 30 % — ABNORMAL LOW (ref 33–65)
CD4 T Cell Abs: 299 /uL — ABNORMAL LOW (ref 400–1790)

## 2021-04-05 LAB — COMPLETE METABOLIC PANEL WITH GFR
AG Ratio: 1.6 (calc) (ref 1.0–2.5)
ALT: 16 U/L (ref 9–46)
AST: 22 U/L (ref 10–35)
Albumin: 3.9 g/dL (ref 3.6–5.1)
Alkaline phosphatase (APISO): 127 U/L (ref 35–144)
BUN: 12 mg/dL (ref 7–25)
CO2: 25 mmol/L (ref 20–32)
Calcium: 8.9 mg/dL (ref 8.6–10.3)
Chloride: 105 mmol/L (ref 98–110)
Creat: 0.99 mg/dL (ref 0.70–1.30)
Globulin: 2.5 g/dL (calc) (ref 1.9–3.7)
Glucose, Bld: 101 mg/dL — ABNORMAL HIGH (ref 65–99)
Potassium: 4.2 mmol/L (ref 3.5–5.3)
Sodium: 138 mmol/L (ref 135–146)
Total Bilirubin: 1.2 mg/dL (ref 0.2–1.2)
Total Protein: 6.4 g/dL (ref 6.1–8.1)
eGFR: 89 mL/min/{1.73_m2} (ref >=60)

## 2021-04-05 LAB — CBC WITH DIFFERENTIAL/PLATELET
Absolute Monocytes: 436 cells/uL (ref 200–950)
Basophils Absolute: 40 cells/uL (ref 0–200)
Basophils Relative: 0.9 %
Eosinophils Absolute: 123 cells/uL (ref 15–500)
Eosinophils Relative: 2.8 %
HCT: 39.7 % (ref 38.5–50.0)
Hemoglobin: 12.1 g/dL — ABNORMAL LOW (ref 13.2–17.1)
Lymphs Abs: 999 cells/uL (ref 850–3900)
MCH: 23.7 pg — ABNORMAL LOW (ref 27.0–33.0)
MCHC: 30.5 g/dL — ABNORMAL LOW (ref 32.0–36.0)
MCV: 77.8 fL — ABNORMAL LOW (ref 80.0–100.0)
MPV: 10.8 fL (ref 7.5–12.5)
Monocytes Relative: 9.9 %
Neutro Abs: 2803 cells/uL (ref 1500–7800)
Neutrophils Relative %: 63.7 %
Platelets: 103 10*3/uL — ABNORMAL LOW (ref 140–400)
RBC: 5.1 10*6/uL (ref 4.20–5.80)
RDW: 15.7 % — ABNORMAL HIGH (ref 11.0–15.0)
Total Lymphocyte: 22.7 %
WBC: 4.4 10*3/uL (ref 3.8–10.8)

## 2021-04-05 LAB — LIPID PANEL
Cholesterol: 139 mg/dL (ref <200)
HDL: 55 mg/dL (ref >=40)
LDL Cholesterol (Calc): 68 mg/dL (calc)
Non-HDL Cholesterol (Calc): 84 mg/dL (calc) (ref <130)
Total CHOL/HDL Ratio: 2.5 (calc) (ref <5.0)
Triglycerides: 77 mg/dL (ref <150)

## 2021-04-05 LAB — HIV-1 RNA QUANT-NO REFLEX-BLD
HIV 1 RNA Quant: NOT DETECTED Copies/mL
HIV-1 RNA Quant, Log: NOT DETECTED Log cps/mL

## 2021-04-05 LAB — RPR: RPR Ser Ql: NONREACTIVE

## 2021-07-16 ENCOUNTER — Other Ambulatory Visit: Payer: Self-pay

## 2021-07-16 ENCOUNTER — Ambulatory Visit (INDEPENDENT_AMBULATORY_CARE_PROVIDER_SITE_OTHER): Payer: Medicaid Other | Admitting: Infectious Disease

## 2021-07-16 VITALS — BP 138/72 | HR 70 | Temp 97.4°F | Wt 340.0 lb

## 2021-07-16 DIAGNOSIS — L819 Disorder of pigmentation, unspecified: Secondary | ICD-10-CM

## 2021-07-16 DIAGNOSIS — D61818 Other pancytopenia: Secondary | ICD-10-CM | POA: Diagnosis not present

## 2021-07-16 DIAGNOSIS — G9009 Other idiopathic peripheral autonomic neuropathy: Secondary | ICD-10-CM

## 2021-07-16 DIAGNOSIS — K7031 Alcoholic cirrhosis of liver with ascites: Secondary | ICD-10-CM | POA: Diagnosis not present

## 2021-07-16 DIAGNOSIS — I8510 Secondary esophageal varices without bleeding: Secondary | ICD-10-CM | POA: Diagnosis not present

## 2021-07-16 DIAGNOSIS — R21 Rash and other nonspecific skin eruption: Secondary | ICD-10-CM | POA: Diagnosis not present

## 2021-07-16 DIAGNOSIS — B20 Human immunodeficiency virus [HIV] disease: Secondary | ICD-10-CM

## 2021-07-16 DIAGNOSIS — E66813 Obesity, class 3: Secondary | ICD-10-CM

## 2021-07-16 DIAGNOSIS — Z23 Encounter for immunization: Secondary | ICD-10-CM | POA: Diagnosis not present
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
## 2021-07-20 LAB — COMPLETE METABOLIC PANEL WITH GFR
AG Ratio: 1.6 (calc) (ref 1.0–2.5)
ALT: 22 U/L (ref 9–46)
AST: 27 U/L (ref 10–35)
Albumin: 4.1 g/dL (ref 3.6–5.1)
Alkaline phosphatase (APISO): 132 U/L (ref 35–144)
BUN: 14 mg/dL (ref 7–25)
CO2: 23 mmol/L (ref 20–32)
Calcium: 9.5 mg/dL (ref 8.6–10.3)
Chloride: 105 mmol/L (ref 98–110)
Creat: 1.08 mg/dL (ref 0.70–1.30)
Globulin: 2.5 g/dL (calc) (ref 1.9–3.7)
Glucose, Bld: 85 mg/dL (ref 65–99)
Potassium: 4.1 mmol/L (ref 3.5–5.3)
Sodium: 138 mmol/L (ref 135–146)
Total Bilirubin: 1.3 mg/dL — ABNORMAL HIGH (ref 0.2–1.2)
Total Protein: 6.6 g/dL (ref 6.1–8.1)
eGFR: 81 mL/min/{1.73_m2} (ref >=60)

## 2021-07-20 LAB — CBC WITH DIFFERENTIAL/PLATELET
Absolute Monocytes: 466 cells/uL (ref 200–950)
Basophils Absolute: 48 cells/uL (ref 0–200)
Basophils Relative: 0.9 %
Eosinophils Absolute: 111 cells/uL (ref 15–500)
Eosinophils Relative: 2.1 %
HCT: 41 % (ref 38.5–50.0)
Hemoglobin: 12.2 g/dL — ABNORMAL LOW (ref 13.2–17.1)
Lymphs Abs: 1023 cells/uL (ref 850–3900)
MCH: 22.4 pg — ABNORMAL LOW (ref 27.0–33.0)
MCHC: 29.8 g/dL — ABNORMAL LOW (ref 32.0–36.0)
MCV: 75.4 fL — ABNORMAL LOW (ref 80.0–100.0)
Monocytes Relative: 8.8 %
Neutro Abs: 3652 cells/uL (ref 1500–7800)
Neutrophils Relative %: 68.9 %
Platelets: 111 10*3/uL — ABNORMAL LOW (ref 140–400)
RBC: 5.44 10*6/uL (ref 4.20–5.80)
RDW: 17.5 % — ABNORMAL HIGH (ref 11.0–15.0)
Total Lymphocyte: 19.3 %
WBC: 5.3 10*3/uL (ref 3.8–10.8)

## 2021-07-20 LAB — HIV-1 RNA QUANT-NO REFLEX-BLD
HIV 1 RNA Quant: NOT DETECTED Copies/mL
HIV-1 RNA Quant, Log: NOT DETECTED Log cps/mL

## 2021-07-20 LAB — RPR: RPR Ser Ql: NONREACTIVE

## 2021-08-07 ENCOUNTER — Telehealth: Payer: Self-pay

## 2021-08-07 NOTE — Telephone Encounter (Signed)
Patient called office to follow up on referral for Dermatology. States that when he called for an appointment he was told they were book out until April. Informed patient that most Dermatology offices are booked out pretty far.  Requested to speak with referral coordinator for assistance. Leatrice Jewels, RMA

## 2021-09-02 ENCOUNTER — Telehealth: Payer: Self-pay

## 2021-09-02 NOTE — Telephone Encounter (Signed)
Patient called office today requesting appt with Dr. Lucianne Lei dam to discuss some concerns. States that he has not been able to schedule appt with GI provider ( Dr. Carlean Purl). Was told by the scheduling staff follow up was not needed. ?Wanted to speak with Dr. Tommy Medal regarding concerns he had about his liver. Discuss his with provider during last visit.  ?Patient also has other concerns he would like to discuss with Dr. Tommy Medal. Patient is scheduled for phone visit tomorrow at 930. ?Leatrice Jewels, RMA ? ?

## 2021-09-03 ENCOUNTER — Other Ambulatory Visit: Payer: Self-pay

## 2021-09-03 ENCOUNTER — Encounter: Payer: Self-pay | Admitting: Infectious Disease

## 2021-09-03 ENCOUNTER — Ambulatory Visit (INDEPENDENT_AMBULATORY_CARE_PROVIDER_SITE_OTHER): Payer: Medicaid Other | Admitting: Infectious Disease

## 2021-09-03 VITALS — Resp 16 | Ht 72.0 in

## 2021-09-03 DIAGNOSIS — D61818 Other pancytopenia: Secondary | ICD-10-CM

## 2021-09-03 DIAGNOSIS — F1011 Alcohol abuse, in remission: Secondary | ICD-10-CM

## 2021-09-03 DIAGNOSIS — K7031 Alcoholic cirrhosis of liver with ascites: Secondary | ICD-10-CM

## 2021-09-03 DIAGNOSIS — B20 Human immunodeficiency virus [HIV] disease: Secondary | ICD-10-CM | POA: Insufficient documentation

## 2021-09-03 DIAGNOSIS — K766 Portal hypertension: Secondary | ICD-10-CM

## 2021-09-03 DIAGNOSIS — Z6841 Body Mass Index (BMI) 40.0 and over, adult: Secondary | ICD-10-CM

## 2021-09-03 DIAGNOSIS — T7611XD Adult physical abuse, suspected, subsequent encounter: Secondary | ICD-10-CM

## 2021-09-03 DIAGNOSIS — K3189 Other diseases of stomach and duodenum: Secondary | ICD-10-CM

## 2021-09-03 DIAGNOSIS — E669 Obesity, unspecified: Secondary | ICD-10-CM

## 2021-09-03 DIAGNOSIS — I10 Essential (primary) hypertension: Secondary | ICD-10-CM

## 2021-09-03 HISTORY — DX: Human immunodeficiency virus (HIV) disease: B20

## 2021-09-03 HISTORY — DX: Obesity, unspecified: E66.9
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
## 2021-09-11 ENCOUNTER — Telehealth: Payer: Self-pay

## 2021-09-11 NOTE — Telephone Encounter (Signed)
Unable to reach pt by phone: Unable to leave voice message  ?

## 2021-09-11 NOTE — Telephone Encounter (Signed)
-----   Message from Gatha Mayer, MD sent at 09/10/2021  5:32 PM EDT ----- ?Regarding: Appointment ?Please schedule appointment for this patient to see me in a follow-up slot ?----- Message ----- ?From: [name], Carl Islam, MD ?Sent: 09/03/2021   9:50 AM EDT ?To: Gatha Mayer, MD ? ?[name] can you connect [name] to seeing you he claims your front office staff will not schedule him. He is not taking his lactulose as prescribed (which may be part of the problem) he says he is having about 3 bowel movements a day as he is currently taking this. ? ?He also asked about colonoscopy and had multiple other concerns ? ?

## 2021-09-14 NOTE — Telephone Encounter (Signed)
Unable to reach pt by phone: Unable to leave voice message  ?

## 2021-09-15 NOTE — Telephone Encounter (Signed)
Through scheduling office  at [facility] where pt resides pt was scheduled for a Office Visit on 10/08/2021 at 10:10 with Dr. Carlean Purl ? ?

## 2021-10-08 ENCOUNTER — Other Ambulatory Visit (INDEPENDENT_AMBULATORY_CARE_PROVIDER_SITE_OTHER): Payer: Medicaid Other

## 2021-10-08 ENCOUNTER — Encounter: Payer: Self-pay | Admitting: Internal Medicine

## 2021-10-08 ENCOUNTER — Ambulatory Visit (INDEPENDENT_AMBULATORY_CARE_PROVIDER_SITE_OTHER): Payer: Medicaid Other | Admitting: Internal Medicine

## 2021-10-08 VITALS — BP 118/70 | HR 93 | Ht 72.0 in | Wt 341.0 lb

## 2021-10-08 DIAGNOSIS — K7682 Hepatic encephalopathy: Secondary | ICD-10-CM

## 2021-10-08 DIAGNOSIS — K625 Hemorrhage of anus and rectum: Secondary | ICD-10-CM

## 2021-10-08 DIAGNOSIS — K7031 Alcoholic cirrhosis of liver with ascites: Secondary | ICD-10-CM

## 2021-10-08 DIAGNOSIS — R161 Splenomegaly, not elsewhere classified: Secondary | ICD-10-CM | POA: Diagnosis not present

## 2021-10-08 LAB — PROTIME-INR
INR: 1.2 ratio — ABNORMAL HIGH (ref 0.8–1.0)
Prothrombin Time: 13.2 s — ABNORMAL HIGH (ref 9.6–13.1)
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
## 2021-10-08 NOTE — Patient Instructions (Signed)
Your provider has requested that you go to the basement level for lab work before leaving today. Press "B" on the elevator. The lab is located at the first door on the left as you exit the elevator. ? ?Due to recent changes in healthcare laws, you may see the results of your imaging and laboratory studies on MyChart before your provider has had a chance to review them.  We understand that in some cases there may be results that are confusing or concerning to you. Not all laboratory results come back in the same time frame and the provider may be waiting for multiple results in order to interpret others.  Please give [place] 48 hours in order for your provider to thoroughly review all the results before contacting the office for clarification of your results.  ? ?You have been scheduled for a colonoscopy. Please follow written instructions given to you at your visit today.  ?Please pick up your prep supplies at the pharmacy within the next 1-3 days. ?If you use inhalers (even only as needed), please bring them with you on the day of your procedure. ? ?You have been scheduled for an abdominal ultrasound at [facility] on ________________ at ____________________. Please arrive 15 minutes prior to your appointment for registration. Make certain not to have anything to eat or drink 6 hours prior to your appointment. Should you need to reschedule your appointment, please contact radiology at [phone]. This test typically takes about 30 minutes to perform. ? ? ?I appreciate the opportunity to care for you. ?Silvano Rusk, MD, [facility] ? ?

## 2021-10-09 LAB — AFP TUMOR MARKER: AFP-Tumor Marker: 3.5 ng/mL (ref <6.1)

## 2021-10-27 ENCOUNTER — Encounter (HOSPITAL_BASED_OUTPATIENT_CLINIC_OR_DEPARTMENT_OTHER): Payer: Self-pay

## 2021-10-27 ENCOUNTER — Ambulatory Visit (HOSPITAL_BASED_OUTPATIENT_CLINIC_OR_DEPARTMENT_OTHER)
Admission: RE | Admit: 2021-10-27 | Discharge: 2021-10-27 | Disposition: A | Discharge status: Home / Self Care | Payer: Medicaid Other | Source: Ambulatory Visit | Attending: Internal Medicine | Admitting: Internal Medicine

## 2021-10-27 DIAGNOSIS — R161 Splenomegaly, not elsewhere classified: Secondary | ICD-10-CM

## 2021-10-27 DIAGNOSIS — K7031 Alcoholic cirrhosis of liver with ascites: Secondary | ICD-10-CM

## 2021-10-29 ENCOUNTER — Telehealth (HOSPITAL_BASED_OUTPATIENT_CLINIC_OR_DEPARTMENT_OTHER): Payer: Self-pay

## 2021-11-10 ENCOUNTER — Telehealth: Payer: Self-pay

## 2021-11-10 NOTE — Telephone Encounter (Signed)
I have left several detailed messages at # [phone] where [name] lives. Once to a person and twice on there voicemail and I faxed over a request on 10/29/21 for them to call and set up his U/S before his colonoscopy on 12/04/21. Att: Verta Ellen (supervisor). They need to call [phone] to set this up, the order is in [place].

## 2021-11-11 NOTE — Telephone Encounter (Signed)
I spoke with Van's RN where he lives- [place] and she said they took him 10/27/2021 and he wasn't NPO so they are going to R/S and try again. I told her to try and do it before his colonoscopy on 12/04/21.

## 2021-11-12 NOTE — Telephone Encounter (Signed)
[name] has his U/S set up to be done 11/17/2021 at 9:00am.

## 2021-11-17 ENCOUNTER — Ambulatory Visit (HOSPITAL_BASED_OUTPATIENT_CLINIC_OR_DEPARTMENT_OTHER)
Admission: RE | Admit: 2021-11-17 | Discharge: 2021-11-17 | Disposition: A | Discharge status: Home / Self Care | Payer: Medicaid Other | Source: Ambulatory Visit | Attending: Internal Medicine | Admitting: Internal Medicine

## 2021-11-17 DIAGNOSIS — K7031 Alcoholic cirrhosis of liver with ascites: Secondary | ICD-10-CM | POA: Diagnosis not present

## 2021-11-17 DIAGNOSIS — R161 Splenomegaly, not elsewhere classified: Secondary | ICD-10-CM | POA: Insufficient documentation

## 2021-11-29 ENCOUNTER — Encounter: Payer: Self-pay | Admitting: Certified Registered Nurse Anesthetist

## 2021-12-04 ENCOUNTER — Encounter: Payer: Medicaid Other | Admitting: Internal Medicine

## 2021-12-04 ENCOUNTER — Telehealth: Payer: Self-pay | Admitting: Internal Medicine

## 2021-12-09 ENCOUNTER — Telehealth: Payer: Self-pay | Admitting: *Deleted

## 2021-12-09 NOTE — Telephone Encounter (Signed)
Patient did not show up for PV today. Called the number listed, was transferred to transportation dept left a message on "Helene Kelp" voice mail for them to call us back today to reschedule PV or the upcoming procedure would not be done.

## 2021-12-09 NOTE — Telephone Encounter (Signed)
Left message to call us back to reschedule PV.

## 2021-12-10 NOTE — Telephone Encounter (Signed)
Called again, no answer. Left a message on "Helene Kelp" voicemail to call us back today to reschedule PV or the procedure would be cancelled.

## 2021-12-10 NOTE — Telephone Encounter (Signed)
Called and spoke with pt's nurse "Katie". I explained that the pt missed PV yesterday and that messages was left with transport "[facility]ene Kel[facility]" but no one has called back to reschedule and that the colon would be cancelled if we do not make a new PV appointment.  She states she will text [facility]ene Kel[facility] about this so she can call us back to make [place] appointment. Will wait for return call.

## 2021-12-11 NOTE — Telephone Encounter (Signed)
Patient was rescheduled with [place] for 7/13 at 11: 00

## 2021-12-24 ENCOUNTER — Ambulatory Visit (AMBULATORY_SURGERY_CENTER): Payer: Self-pay | Admitting: *Deleted

## 2021-12-24 VITALS — Ht 72.0 in | Wt 350.0 lb

## 2021-12-24 DIAGNOSIS — Z1211 Encounter for screening for malignant neoplasm of colon: Secondary | ICD-10-CM

## 2021-12-24 NOTE — Progress Notes (Signed)
No egg or soy allergy known to patient  No issues known to pt with past sedation with any surgeries or procedures Patient denies ever being told they had issues or difficulty with intubation  No FH of Malignant Hyperthermia Pt is not on diet pills Pt is not on  home 02  Pt is not on blood thinners  Pt denies issues with constipation, uses Miralax daily. Encouraged to continue to use daily prior to procedure No A fib or A flutter   PV completed in person. Pt verified name, DOB.  Procedure explained to pt. Prep instructions reviewed, questions answered. Pt encouraged to call with questions or issues.  If pt has My chart, procedure instructions sent via My Chart

## 2022-01-03 ENCOUNTER — Encounter: Payer: Self-pay | Admitting: Certified Registered Nurse Anesthetist

## 2022-01-11 ENCOUNTER — Ambulatory Visit (AMBULATORY_SURGERY_CENTER): Payer: Medicaid Other | Admitting: Internal Medicine

## 2022-01-11 ENCOUNTER — Encounter: Payer: Self-pay | Admitting: Internal Medicine

## 2022-01-11 VITALS — BP 127/73 | HR 79 | Temp 99.8°F | Resp 15 | Ht 72.0 in | Wt 350.0 lb

## 2022-01-11 DIAGNOSIS — K648 Other hemorrhoids: Secondary | ICD-10-CM

## 2022-01-11 DIAGNOSIS — Z1211 Encounter for screening for malignant neoplasm of colon: Secondary | ICD-10-CM | POA: Diagnosis not present

## 2022-01-11 MED ORDER — SODIUM CHLORIDE 0.9 % IV SOLN: 500.0000 mL | Freq: Once | INTRAVENOUS | Status: DC

## 2022-01-11 NOTE — Patient Instructions (Addendum)
I did not see any polyps or cancer. The prep was not as good as we like but no large lesions seen. I did see hemorrhoids.  I appreciate the opportunity to care for you. Gatha Mayer, MD, [facility]   Resume previous diet and medications.  YOU HAD AN ENDOSCOPIC PROCEDURE TODAY AT [facility]:   Refer to the procedure report that was given to you for any specific questions about what was found during the examination.  If the procedure report does not answer your questions, please call your gastroenterologist to clarify.  If you requested that your care partner not be given the details of your procedure findings, then the procedure report has been included in a sealed envelope for you to review at your convenience later.  YOU SHOULD EXPECT: Some feelings of bloating in the abdomen. Passage of more gas than usual.  Walking can help get rid of the air that was put into your GI tract during the procedure and reduce the bloating. If you had a lower endoscopy (such as a colonoscopy or flexible sigmoidoscopy) you may notice spotting of blood in your stool or on the toilet paper. If you underwent a bowel prep for your procedure, you may not have a normal bowel movement for a few days.  Please Note:  You might notice some irritation and congestion in your nose or some drainage.  This is from the oxygen used during your procedure.  There is no need for concern and it should clear up in a day or so.  SYMPTOMS TO REPORT IMMEDIATELY:  Following lower endoscopy (colonoscopy or flexible sigmoidoscopy):  Excessive amounts of blood in the stool  Significant tenderness or worsening of abdominal pains  Swelling of the abdomen that is new, acute  Fever of 100F or higher   For urgent or emergent issues, a gastroenterologist can be reached at any hour by calling [phone]. Do not use MyChart messaging for urgent concerns.    DIET:  We do recommend a small meal at first, but then you may  proceed to your regular diet.  Drink plenty of fluids but you should avoid alcoholic beverages for 24 hours.  ACTIVITY:  You should plan to take it easy for the rest of today and you should NOT DRIVE or use heavy machinery until tomorrow (because of the sedation medicines used during the test).    FOLLOW UP: Our staff will call the number listed on your records the next business day following your procedure.  We will call around 7:15- 8:00 am to check on you and address any questions or concerns that you may have regarding the information given to you following your procedure. If we do not reach you, we will leave a message.  If you develop any symptoms (ie: fever, flu-like symptoms, shortness of breath, cough etc.) before then, please call [phone].  If you test positive for Covid 19 in the 2 weeks post procedure, please call and report this information to [place].    If any biopsies were taken you will be contacted by phone or by letter within the next 1-3 weeks.  Please call us at [phone] if you have not heard about the biopsies in 3 weeks.    SIGNATURES/CONFIDENTIALITY: You and/or your care partner have signed paperwork which will be entered into your electronic medical record.  These signatures attest to the fact that that the information above on your After Visit Summary has been reviewed and is understood.  Full responsibility of the confidentiality of this discharge information lies with you and/or your care-partner.  

## 2022-01-11 NOTE — Progress Notes (Signed)
Report given to PACU, vss 
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
## 2022-01-11 NOTE — Op Note (Signed)
[facility] Patient Name: [name] Procedure Date: 01/11/2022 3:03 PM MRN: [id number] Endoscopist: Gatha Mayer , MD Age: 56 Referring MD:  Date of Birth: 1965-08-25 Gender: Male Account #: [id number] Procedure:                Colonoscopy Indications:              Screening for colorectal malignant neoplasm Medicines:                Monitored Anesthesia Care Procedure:                Pre-Anesthesia Assessment:                           - Prior to the procedure, a History and Physical                            was performed, and patient medications and                            allergies were reviewed. The patient's tolerance of                            previous anesthesia was also reviewed. The risks                            and benefits of the procedure and the sedation                            options and risks were discussed with the patient.                            All questions were answered, and informed consent                            was obtained. Prior Anticoagulants: The patient has                            taken no previous anticoagulant or antiplatelet                            agents. ASA Grade Assessment: III - A patient with                            severe systemic disease. After reviewing the risks                            and benefits, the patient was deemed in                            satisfactory condition to undergo the procedure.                           After obtaining informed consent, the colonoscope  was passed under direct vision. Throughout the                            procedure, the patient's blood pressure, pulse, and                            oxygen saturations were monitored continuously. The                            CF [id number] #[id number] was introduced through the anus                            and advanced to the the cecum, identified by                            appendiceal orifice  and ileocecal valve. The                            colonoscopy was performed with difficulty due to                            significant looping and retained liquid prep and                            vegetable material.[Solution]. The quality of the                            bowel preparation was fair. The ileocecal valve,                            the appendiceal orifice and the rectum were                            photographed. The patient tolerated the procedure                            well. The bowel preparation used was Plenvu via                            split dose instruction. Scope In: 3:07:50 PM Scope Out: 1:02:58 PM Scope Withdrawal Time: 0 hours 16 minutes 0 seconds  Total Procedure Duration: 0 hours 39 minutes 7 seconds  Findings:                 The perianal and digital rectal examinations were                            normal.                           Internal hemorrhoids were found.                           The colon (entire examined portion) was  significantly redundant. Advancing the scope                            required applying abdominal pressure.                           The exam was otherwise without abnormality on                            direct and retroflexion views. Complications:            No immediate complications. Estimated Blood Loss:     Estimated blood loss: none. Impression:               - Preparation of the colon was fair.                           - Internal hemorrhoids.                           - Redundant colon.                           - The examination was otherwise normal on direct                            and retroflexion views.                           - No specimens collected. Recommendation:           - Patient has a contact number available for                            emergencies. The signs and symptoms of potential                            delayed complications were discussed with  the                            patient. Return to normal activities tomorrow.                            Written discharge instructions were provided to the                            patient.                           - Resume previous diet.                           - Continue present medications.                           - No recommendation at this time regarding repeat  colonoscopy. Am not iclined to repeat given health                            status and difficulty of this procedure. If we did                            would do double prep and consider abdominal binder                            XXL use. Gatha Mayer, MD 01/11/2022 3:55:55 PM This report has been signed electronically.

## 2022-01-12 ENCOUNTER — Telehealth: Payer: Self-pay

## 2022-01-12 NOTE — Telephone Encounter (Signed)
No answer/busy on follow up call. 

## 2022-01-13 ENCOUNTER — Ambulatory Visit: Payer: Medicaid Other | Admitting: Infectious Disease

## 2022-01-15 ENCOUNTER — Telehealth: Payer: Self-pay | Admitting: Internal Medicine

## 2022-01-15 NOTE — Telephone Encounter (Signed)
Pt called in and stated that he wanted Dr. Carlean Purl to be aware of that he did not think that his prep was sufficient for his Colonoscopy due to the place where he is living ( [place] ): Pt stated that they gave him apple sauce and pudding the day before: Pt wanted Dr. Carlean Purl to know that he is trying to be compliant and not compromise his care Pt stated that he " wanted to get that off his chest" and make Dr. Carlean Purl aware:

## 2022-03-08 ENCOUNTER — Telehealth: Payer: Self-pay | Admitting: Internal Medicine

## 2022-03-08 NOTE — Telephone Encounter (Signed)
Patient called states his prep might be done wrong, states they might gave him wrong food. Requesting a call back from a nurse. Please to advise.

## 2022-03-08 NOTE — Telephone Encounter (Signed)
Unable to reach patient at main number provided. Assisted living continues to transfer phone call to the wrong patient. Left message for patient on his cell number listed.

## 2022-03-08 NOTE — Telephone Encounter (Signed)
Patient called to follow up on message sent earlier. Looking at patients chart there is a previous message regarding the same thing on 8/4. Please advise.

## 2022-03-09 NOTE — Telephone Encounter (Signed)
Left message for patient to call back  

## 2022-03-09 NOTE — Telephone Encounter (Signed)
Patient returned your call.

## 2022-03-09 NOTE — Telephone Encounter (Signed)
Left message for patient to call back on number provided.

## 2022-03-09 NOTE — Telephone Encounter (Signed)
Patient returned call, stated you can reach him at [phone]. Please advise.

## 2022-03-09 NOTE — Telephone Encounter (Signed)
Patient returned your call. Please advise.  

## 2022-03-10 NOTE — Telephone Encounter (Signed)
Nurse at facility stated that she would give pt our number for pt to call back:  Left message on pt cell phone number also to call back at the office:

## 2022-03-10 NOTE — Telephone Encounter (Signed)
Spoke with [name]: [name] stated that he would like to discuss the results of his last procedure with Dr. Carlean Purl: [name] stated that the conditions where he is staying are not the best and is concerned about his diet: [name] was scheduled for an office visit  with Dr. Carlean Purl on 05/14/2022 at 10:30 AM: [name] mader aware: [name] verbalized understanding with all questions answered.

## 2022-03-10 NOTE — Telephone Encounter (Signed)
Left message for patient to call back  

## 2022-03-29 ENCOUNTER — Ambulatory Visit: Payer: Medicaid Other | Admitting: Infectious Disease

## 2022-04-01 ENCOUNTER — Telehealth: Payer: Self-pay | Admitting: Internal Medicine

## 2022-04-01 NOTE — Telephone Encounter (Signed)
Unable to reach him , left him a voice mail that I will try to reach him later.

## 2022-04-01 NOTE — Telephone Encounter (Signed)
Patient called states he would like to speak with you regarding his result before seeing Dr Carlean Purl on phone # [phone]

## 2022-04-01 NOTE — Telephone Encounter (Signed)
Unable to reach [name], left him another voicemail. I will try again tomorrow to reach him.

## 2022-04-02 NOTE — Telephone Encounter (Signed)
I spoke with [name] and he wanted to apologize for the way he was prepped for his colonoscopy in July. He said where he lives didn't help him to prep properly. He wants to move out of there.

## 2022-04-08 ENCOUNTER — Other Ambulatory Visit: Payer: Self-pay

## 2022-04-08 ENCOUNTER — Ambulatory Visit (INDEPENDENT_AMBULATORY_CARE_PROVIDER_SITE_OTHER): Payer: Medicaid Other | Admitting: Infectious Disease

## 2022-04-08 DIAGNOSIS — K7682 Hepatic encephalopathy: Secondary | ICD-10-CM | POA: Diagnosis present

## 2022-04-08 DIAGNOSIS — D61818 Other pancytopenia: Secondary | ICD-10-CM | POA: Diagnosis not present

## 2022-04-08 DIAGNOSIS — I10 Essential (primary) hypertension: Secondary | ICD-10-CM

## 2022-04-08 DIAGNOSIS — E785 Hyperlipidemia, unspecified: Secondary | ICD-10-CM | POA: Diagnosis not present

## 2022-04-08 DIAGNOSIS — B20 Human immunodeficiency virus [HIV] disease: Secondary | ICD-10-CM

## 2022-04-08 DIAGNOSIS — F1020 Alcohol dependence, uncomplicated: Secondary | ICD-10-CM

## 2022-04-08 MED ORDER — DESCOVY 200-25 MG PO TABS: 1.0000 | ORAL_TABLET | Freq: Every day | ORAL | 11 refills | Status: DC

## 2022-04-08 MED ORDER — PITAVASTATIN MAGNESIUM 4 MG PO TABS
4.0000 mg | ORAL_TABLET | Freq: Every day | ORAL | 11 refills | Status: DC
Start: 2022-04-08 — End: 2022-07-12

## 2022-04-08 MED ORDER — DORAVIRINE 100 MG PO TABS: 100.0000 mg | ORAL_TABLET | Freq: Every day | ORAL | 11 refills | Status: DC

## 2022-04-08 NOTE — Addendum Note (Signed)
Addended by: Caffie Pinto on: 04/08/2022 11:54 AM   Modules accepted: Orders
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
## 2022-04-09 LAB — T-HELPER CELLS (CD4) COUNT (NOT AT ARMC)
CD4 % Helper T Cell: 30 % — ABNORMAL LOW (ref 33–65)
CD4 T Cell Abs: 236 /uL — ABNORMAL LOW (ref 400–1790)

## 2022-04-10 LAB — LIPID PANEL
Cholesterol: 138 mg/dL (ref <200)
HDL: 62 mg/dL (ref >=40)
LDL Cholesterol (Calc): 63 mg/dL (calc)
Non-HDL Cholesterol (Calc): 76 mg/dL (calc) (ref <130)
Total CHOL/HDL Ratio: 2.2 (calc) (ref <5.0)
Triglycerides: 57 mg/dL (ref <150)

## 2022-04-10 LAB — CBC WITH DIFFERENTIAL/PLATELET
Absolute Monocytes: 409 cells/uL (ref 200–950)
Basophils Absolute: 51 cells/uL (ref 0–200)
Basophils Relative: 1.1 %
Eosinophils Absolute: 170 cells/uL (ref 15–500)
Eosinophils Relative: 3.7 %
HCT: 35.8 % — ABNORMAL LOW (ref 38.5–50.0)
Hemoglobin: 10.9 g/dL — ABNORMAL LOW (ref 13.2–17.1)
Lymphs Abs: 869 cells/uL (ref 850–3900)
MCH: 22.7 pg — ABNORMAL LOW (ref 27.0–33.0)
MCHC: 30.4 g/dL — ABNORMAL LOW (ref 32.0–36.0)
MCV: 74.6 fL — ABNORMAL LOW (ref 80.0–100.0)
MPV: 11.2 fL (ref 7.5–12.5)
Monocytes Relative: 8.9 %
Neutro Abs: 3100 cells/uL (ref 1500–7800)
Neutrophils Relative %: 67.4 %
Platelets: 103 10*3/uL — ABNORMAL LOW (ref 140–400)
RBC: 4.8 10*6/uL (ref 4.20–5.80)
RDW: 16.3 % — ABNORMAL HIGH (ref 11.0–15.0)
Total Lymphocyte: 18.9 %
WBC: 4.6 10*3/uL (ref 3.8–10.8)

## 2022-04-10 LAB — COMPLETE METABOLIC PANEL WITH GFR
AG Ratio: 1.9 (calc) (ref 1.0–2.5)
ALT: 22 U/L (ref 9–46)
AST: 25 U/L (ref 10–35)
Albumin: 4 g/dL (ref 3.6–5.1)
Alkaline phosphatase (APISO): 143 U/L (ref 35–144)
BUN: 14 mg/dL (ref 7–25)
CO2: 26 mmol/L (ref 20–32)
Calcium: 9 mg/dL (ref 8.6–10.3)
Chloride: 106 mmol/L (ref 98–110)
Creat: 1.04 mg/dL (ref 0.70–1.30)
Globulin: 2.1 g/dL (calc) (ref 1.9–3.7)
Glucose, Bld: 152 mg/dL — ABNORMAL HIGH (ref 65–99)
Potassium: 3.9 mmol/L (ref 3.5–5.3)
Sodium: 139 mmol/L (ref 135–146)
Total Bilirubin: 1.2 mg/dL (ref 0.2–1.2)
Total Protein: 6.1 g/dL (ref 6.1–8.1)
eGFR: 84 mL/min/{1.73_m2} (ref >=60)

## 2022-04-10 LAB — RPR: RPR Ser Ql: NONREACTIVE

## 2022-04-10 LAB — HIV-1 RNA QUANT-NO REFLEX-BLD
HIV 1 RNA Quant: <20 Copies/mL — ABNORMAL HIGH
HIV-1 RNA Quant, Log: <1.3 Log cps/mL — ABNORMAL HIGH

## 2022-04-10 LAB — AMMONIA: Ammonia: 53 umol/L (ref <=72)

## 2022-05-11 ENCOUNTER — Ambulatory Visit: Payer: Medicaid Other | Admitting: Infectious Disease

## 2022-05-12 ENCOUNTER — Ambulatory Visit (INDEPENDENT_AMBULATORY_CARE_PROVIDER_SITE_OTHER): Payer: Medicaid Other | Admitting: Infectious Disease

## 2022-05-12 ENCOUNTER — Other Ambulatory Visit: Payer: Self-pay

## 2022-05-12 DIAGNOSIS — B20 Human immunodeficiency virus [HIV] disease: Secondary | ICD-10-CM | POA: Diagnosis not present

## 2022-05-12 DIAGNOSIS — Z8659 Personal history of other mental and behavioral disorders: Secondary | ICD-10-CM

## 2022-05-12 DIAGNOSIS — F332 Major depressive disorder, recurrent severe without psychotic features: Secondary | ICD-10-CM

## 2022-05-12 DIAGNOSIS — K7031 Alcoholic cirrhosis of liver with ascites: Secondary | ICD-10-CM | POA: Diagnosis not present

## 2022-05-12 DIAGNOSIS — D61818 Other pancytopenia: Secondary | ICD-10-CM

## 2022-05-12 DIAGNOSIS — E785 Hyperlipidemia, unspecified: Secondary | ICD-10-CM

## 2022-05-12 DIAGNOSIS — I10 Essential (primary) hypertension: Secondary | ICD-10-CM
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
## 2022-05-14 ENCOUNTER — Encounter: Payer: Self-pay | Admitting: Internal Medicine

## 2022-05-14 ENCOUNTER — Ambulatory Visit (INDEPENDENT_AMBULATORY_CARE_PROVIDER_SITE_OTHER): Payer: Medicaid Other | Admitting: Internal Medicine

## 2022-05-14 ENCOUNTER — Other Ambulatory Visit (INDEPENDENT_AMBULATORY_CARE_PROVIDER_SITE_OTHER): Payer: Medicaid Other

## 2022-05-14 VITALS — BP 112/86 | HR 79 | Ht 72.0 in | Wt 333.0 lb

## 2022-05-14 DIAGNOSIS — D509 Iron deficiency anemia, unspecified: Secondary | ICD-10-CM

## 2022-05-14 DIAGNOSIS — R161 Splenomegaly, not elsewhere classified: Secondary | ICD-10-CM

## 2022-05-14 DIAGNOSIS — K766 Portal hypertension: Secondary | ICD-10-CM

## 2022-05-14 DIAGNOSIS — I8511 Secondary esophageal varices with bleeding: Secondary | ICD-10-CM

## 2022-05-14 DIAGNOSIS — K7682 Hepatic encephalopathy: Secondary | ICD-10-CM

## 2022-05-14 DIAGNOSIS — K7031 Alcoholic cirrhosis of liver with ascites: Secondary | ICD-10-CM

## 2022-05-14 DIAGNOSIS — K3189 Other diseases of stomach and duodenum: Secondary | ICD-10-CM

## 2022-05-14 LAB — CBC WITH DIFFERENTIAL/PLATELET
Basophils Absolute: 0.2 10*3/uL — ABNORMAL HIGH (ref 0.0–0.1)
Basophils Relative: 3.1 % — ABNORMAL HIGH (ref 0.0–3.0)
Eosinophils Absolute: 0.3 10*3/uL (ref 0.0–0.7)
Eosinophils Relative: 4.7 % (ref 0.0–5.0)
HCT: 37.1 % — ABNORMAL LOW (ref 39.0–52.0)
Hemoglobin: 11.5 g/dL — ABNORMAL LOW (ref 13.0–17.0)
Lymphocytes Relative: 17.4 % (ref 12.0–46.0)
Lymphs Abs: 1 10*3/uL (ref 0.7–4.0)
MCHC: 31.1 g/dL (ref 30.0–36.0)
MCV: 73.8 fl — ABNORMAL LOW (ref 78.0–100.0)
Monocytes Absolute: 0.6 10*3/uL (ref 0.1–1.0)
Monocytes Relative: 9.4 % (ref 3.0–12.0)
Neutro Abs: 3.9 10*3/uL (ref 1.4–7.7)
Neutrophils Relative %: 65.4 % (ref 43.0–77.0)
Platelets: 101 10*3/uL — ABNORMAL LOW (ref 150.0–400.0)
RBC: 5.03 Mil/uL (ref 4.22–5.81)
RDW: 19 % — ABNORMAL HIGH (ref 11.5–15.5)
WBC: 5.9 10*3/uL (ref 4.0–10.5)

## 2022-05-14 LAB — FERRITIN: Ferritin: 7 ng/mL — ABNORMAL LOW (ref 22.0–322.0)

## 2022-05-14 LAB — PROTIME-INR
INR: 1.3 ratio — ABNORMAL HIGH (ref 0.8–1.0)
Prothrombin Time: 13.8 s — ABNORMAL HIGH (ref 9.6–13.1)

## 2022-05-14 LAB — VITAMIN D 25 HYDROXY (VIT D DEFICIENCY, FRACTURES): VITD: 32.92 ng/mL (ref 30.00–100.00)
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
## 2022-05-14 NOTE — Patient Instructions (Signed)
If you are age 57 or younger, your body mass index should be between 19-25. Your Body mass index is 45.16 kg/m. If this is out of the aformentioned range listed, please consider follow up with your Primary Care Provider.  ________________________________________________________  The [facility] providers would like to encourage you to use [facility] to communicate with providers for non-urgent requests or questions.  Due to long hold times on the telephone, sending your provider a message by [facility] may be a faster and more efficient way to get a response.  Please allow 48 business hours for a response.  Please remember that this is for non-urgent requests.  _______________________________________________________  Your provider has requested that you go to the basement level for lab work before leaving today. Press "B" on the elevator. The lab is located at the first door on the left as you exit the elevator.  You have been scheduled for an abdominal ultrasound at [facility] on 05/19/2022 at [place]. Please arrive 30 minutes prior to your appointment for registration. Make certain not to have anything to eat or drink 6 hours prior to your appointment. Should you need to reschedule your appointment, please contact radiology at [phone]. This test typically takes about 30 minutes to perform.  Due to recent changes in healthcare laws, you may see the results of your imaging and laboratory studies on MyChart before your provider has had a chance to review them.  We understand that in some cases there may be results that are confusing or concerning to you. Not all laboratory results come back in the same time frame and the provider may be waiting for multiple results in order to interpret others.  Please give [place] 48 hours in order for your provider to thoroughly review all the results before contacting the office for clarification of your results.   I appreciate the opportunity to care for you. Silvano Rusk, MD [facility]

## 2022-05-17 LAB — AFP TUMOR MARKER: AFP-Tumor Marker: 2.9 ng/mL (ref <6.1)

## 2022-05-19 ENCOUNTER — Ambulatory Visit (HOSPITAL_BASED_OUTPATIENT_CLINIC_OR_DEPARTMENT_OTHER): Admission: RE | Admit: 2022-05-19 | Payer: Medicaid Other | Source: Ambulatory Visit

## 2022-05-20 ENCOUNTER — Telehealth: Payer: Self-pay

## 2022-05-20 NOTE — Telephone Encounter (Signed)
Message Received: Today Chnupa, Salvadore Dom, RN  Gillermina Hu, RN Patient needs 6 month follow up US. Orders need to be placed. Refer to last [place] result note.

## 2022-05-20 NOTE — Telephone Encounter (Signed)
Pt chart reviewed and noted documentation from recent office note on 05/14/2022 "Surveillance and screening abdominal ultrasound is ordered."

## 2022-06-29 NOTE — Progress Notes (Deleted)
   Subjective:    Patient ID: [name], male    DOB: Jan 27, 1966, 57 y.o.   MRN: [id number]  HPI    Review of Systems     Objective:   Physical Exam        Assessment & Plan:   HIV disease:  I will add order HIV viral load CD4 count CBC with differential CMP, RPR GC and chlamydia and I will continue  [name]'s Pifeltro and Descovy prescriptions  Hyperlipidemia: I will check a lipid pane and renew his    Alcoholic cirrhosis with esophageal varicess, and ascites, hepatic encephalopathy [facility]   Doing much better since stopped drinking and living at [facility]  Vaccine counseling: recommended annual flu, covid 19 updated vaccine, DTAP and shingrix

## 2022-06-30 ENCOUNTER — Ambulatory Visit: Payer: Medicaid Other | Admitting: Infectious Disease

## 2022-06-30 ENCOUNTER — Telehealth: Payer: Self-pay

## 2022-06-30 DIAGNOSIS — T7611XD Adult physical abuse, suspected, subsequent encounter: Secondary | ICD-10-CM

## 2022-06-30 DIAGNOSIS — E785 Hyperlipidemia, unspecified: Secondary | ICD-10-CM

## 2022-06-30 DIAGNOSIS — K7031 Alcoholic cirrhosis of liver with ascites: Secondary | ICD-10-CM

## 2022-06-30 DIAGNOSIS — K7682 Hepatic encephalopathy: Secondary | ICD-10-CM

## 2022-06-30 DIAGNOSIS — B20 Human immunodeficiency virus [HIV] disease: Secondary | ICD-10-CM

## 2022-06-30 NOTE — Telephone Encounter (Signed)
Called patient to reschedule missed appointment. No answer at home or mobile phone numbers. Continuous ring at home number; voicemailbox was full at mobile number. Unable to leave a message. Also attempted to call [facility] [phone]). No answer and no voicemail set up.  Binnie Kand, RN

## 2022-07-07 ENCOUNTER — Encounter: Payer: Self-pay | Admitting: Internal Medicine

## 2022-07-07 ENCOUNTER — Ambulatory Visit (INDEPENDENT_AMBULATORY_CARE_PROVIDER_SITE_OTHER): Payer: Medicaid Other | Admitting: Internal Medicine

## 2022-07-07 VITALS — BP 130/72 | HR 79 | Ht 72.0 in | Wt 330.0 lb

## 2022-07-07 DIAGNOSIS — E559 Vitamin D deficiency, unspecified: Secondary | ICD-10-CM

## 2022-07-07 DIAGNOSIS — K7031 Alcoholic cirrhosis of liver with ascites: Secondary | ICD-10-CM

## 2022-07-07 DIAGNOSIS — K3189 Other diseases of stomach and duodenum: Secondary | ICD-10-CM

## 2022-07-07 DIAGNOSIS — K766 Portal hypertension: Secondary | ICD-10-CM

## 2022-07-07 DIAGNOSIS — D509 Iron deficiency anemia, unspecified: Secondary | ICD-10-CM

## 2022-07-07 DIAGNOSIS — K7682 Hepatic encephalopathy: Secondary | ICD-10-CM

## 2022-07-07 DIAGNOSIS — I8511 Secondary esophageal varices with bleeding: Secondary | ICD-10-CM | POA: Diagnosis not present

## 2022-07-07 NOTE — Patient Instructions (Addendum)
We will obtain your medicine list for Dr Carlean Purl to review.  You need a Colonoscopy and EGD, most likely we will try to do this in April at the hospital with a double prep being used.  I appreciate the opportunity to care for you. Carl Rusk, MD, [facility]
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
## 2022-07-12 ENCOUNTER — Encounter: Payer: Self-pay | Admitting: Internal Medicine

## 2022-07-15 NOTE — Care Management (Signed)
Marcell's medicine list has been updated and Dr Carlean Purl has been made aware.

## 2022-07-19 ENCOUNTER — Telehealth: Payer: Self-pay | Admitting: Internal Medicine

## 2022-07-19 NOTE — Telephone Encounter (Signed)
Inbound call from patient, wishes to speak to PJ in regards to medication list. Patient wanted to go over medications with PJ. Did not disclose any further. Please advise.

## 2022-07-19 NOTE — Telephone Encounter (Signed)
[name]'s number just rings, no voice mail. I will try again later. I updated his medicine list with a nurse where he lives. They faxed over a current list and chart was updated.

## 2022-07-19 NOTE — Telephone Encounter (Signed)
I was unable to reach [place], will try again later.

## 2022-07-23 NOTE — Telephone Encounter (Signed)
Tried to reach [name] again today and it just rings.

## 2022-08-09 ENCOUNTER — Other Ambulatory Visit: Payer: Self-pay

## 2022-08-09 ENCOUNTER — Other Ambulatory Visit: Payer: Self-pay | Admitting: Internal Medicine

## 2022-08-09 ENCOUNTER — Telehealth: Payer: Self-pay | Admitting: Internal Medicine

## 2022-08-09 ENCOUNTER — Ambulatory Visit: Payer: Self-pay | Admitting: Infectious Disease

## 2022-08-09 ENCOUNTER — Other Ambulatory Visit: Payer: Medicaid Other

## 2022-08-09 DIAGNOSIS — D509 Iron deficiency anemia, unspecified: Secondary | ICD-10-CM

## 2022-08-09 DIAGNOSIS — B20 Human immunodeficiency virus [HIV] disease: Secondary | ICD-10-CM

## 2022-08-09 NOTE — Telephone Encounter (Signed)
I spoke to [name] about bringing [name] 1st at either 2:50pm or 3:10pm. They will have transportation call me back. I have booked him a hospital ECL at [facility] on 08/26/2022, arrive at 7:30AM for a 9:00AM start. Case # [id number], booked with Maudie Mercury. It will be a double prep.

## 2022-08-09 NOTE — Telephone Encounter (Signed)
I spoke with his RN Jarold Song at [facility] and she said he can come March 1st at 2:50pm. I told her about the [facility] date/time and we will give him instructions when he comes. She is going to let [place] know about these appointments.

## 2022-08-09 NOTE — Telephone Encounter (Signed)
Let's try to get [name] in to see me 3/1 (banding slot) and scheduled for EGD and colonoscopy 3/14 at [facility]  Dx iron deficiency anemia

## 2022-08-09 NOTE — Telephone Encounter (Signed)
Someone took the 2:50 time so I plugged him into the 3:10pm time on 08/13/2022.

## 2022-08-10 LAB — T-HELPER CELL (CD4) - (RCID CLINIC ONLY)
CD4 % Helper T Cell: 27 % — ABNORMAL LOW (ref 33–65)
CD4 T Cell Abs: 297 /uL — ABNORMAL LOW (ref 400–1790)

## 2022-08-12 LAB — COMPLETE METABOLIC PANEL WITH GFR
AG Ratio: 1.5 (calc) (ref 1.0–2.5)
ALT: 19 U/L (ref 9–46)
AST: 24 U/L (ref 10–35)
Albumin: 3.8 g/dL (ref 3.6–5.1)
Alkaline phosphatase (APISO): 141 U/L (ref 35–144)
BUN: 14 mg/dL (ref 7–25)
CO2: 26 mmol/L (ref 20–32)
Calcium: 8.7 mg/dL (ref 8.6–10.3)
Chloride: 105 mmol/L (ref 98–110)
Creat: 1.15 mg/dL (ref 0.70–1.30)
Globulin: 2.5 g/dL (calc) (ref 1.9–3.7)
Glucose, Bld: 73 mg/dL (ref 65–99)
Potassium: 4.3 mmol/L (ref 3.5–5.3)
Sodium: 140 mmol/L (ref 135–146)
Total Bilirubin: 1.1 mg/dL (ref 0.2–1.2)
Total Protein: 6.3 g/dL (ref 6.1–8.1)
eGFR: 74 mL/min/{1.73_m2} (ref >=60)

## 2022-08-12 LAB — CBC WITH DIFFERENTIAL/PLATELET
Absolute Monocytes: 571 cells/uL (ref 200–950)
Basophils Absolute: 31 cells/uL (ref 0–200)
Basophils Relative: 0.6 %
Eosinophils Absolute: 158 cells/uL (ref 15–500)
Eosinophils Relative: 3.1 %
HCT: 41.5 % (ref 38.5–50.0)
Hemoglobin: 13.4 g/dL (ref 13.2–17.1)
Lymphs Abs: 1153 cells/uL (ref 850–3900)
MCH: 26.4 pg — ABNORMAL LOW (ref 27.0–33.0)
MCHC: 32.3 g/dL (ref 32.0–36.0)
MCV: 81.9 fL (ref 80.0–100.0)
MPV: 11.1 fL (ref 7.5–12.5)
Monocytes Relative: 11.2 %
Neutro Abs: 3188 cells/uL (ref 1500–7800)
Neutrophils Relative %: 62.5 %
Platelets: 94 10*3/uL — ABNORMAL LOW (ref 140–400)
RBC: 5.07 10*6/uL (ref 4.20–5.80)
RDW: 17.5 % — ABNORMAL HIGH (ref 11.0–15.0)
Total Lymphocyte: 22.6 %
WBC: 5.1 10*3/uL (ref 3.8–10.8)

## 2022-08-12 LAB — HIV-1 RNA QUANT-NO REFLEX-BLD
HIV 1 RNA Quant: NOT DETECTED Copies/mL
HIV-1 RNA Quant, Log: NOT DETECTED Log cps/mL

## 2022-08-13 ENCOUNTER — Telehealth: Payer: Self-pay

## 2022-08-13 ENCOUNTER — Ambulatory Visit: Payer: Medicaid Other | Admitting: Internal Medicine

## 2022-08-13 NOTE — Telephone Encounter (Signed)
Recco fell and hurt his ankle and was also having transportation issues  and was unable to come to his appointment today. He is booked for a hospital procedure 08/26/2022. Please advise [name], thank you.

## 2022-08-13 NOTE — Telephone Encounter (Signed)
Cancel the March 14 procedures and reschedule him for next available office visit which will be early April

## 2022-08-16 NOTE — Telephone Encounter (Signed)
Hospital ECL has been cancelled. I called where he lives and the scheduler is not there yet today. I will call back later today to set up the appointment with Helene Kelp.

## 2022-08-16 NOTE — Telephone Encounter (Signed)
Appointment set up for 10/15/2022 at 8:30 with Helene Kelp where he lives. She will let him know.

## 2022-08-19 NOTE — Telephone Encounter (Signed)
Patient called and confirmed the appt.

## 2022-08-26 ENCOUNTER — Encounter (HOSPITAL_COMMUNITY): Payer: Self-pay

## 2022-08-26 ENCOUNTER — Ambulatory Visit (HOSPITAL_COMMUNITY): Admit: 2022-08-26 | Payer: Medicaid Other | Admitting: Internal Medicine

## 2022-08-26 SURGERY — COLONOSCOPY WITH PROPOFOL
Anesthesia: Monitor Anesthesia Care

## 2022-09-14 ENCOUNTER — Ambulatory Visit (INDEPENDENT_AMBULATORY_CARE_PROVIDER_SITE_OTHER): Payer: Medicaid Other | Admitting: Infectious Disease

## 2022-09-14 ENCOUNTER — Other Ambulatory Visit: Payer: Self-pay

## 2022-09-14 ENCOUNTER — Ambulatory Visit (INDEPENDENT_AMBULATORY_CARE_PROVIDER_SITE_OTHER): Payer: Medicaid Other

## 2022-09-14 ENCOUNTER — Encounter: Payer: Self-pay | Admitting: Infectious Disease

## 2022-09-14 VITALS — BP 140/84 | HR 83 | Temp 98.2°F | Ht 72.0 in | Wt 350.0 lb

## 2022-09-14 DIAGNOSIS — Z7185 Encounter for immunization safety counseling: Secondary | ICD-10-CM

## 2022-09-14 DIAGNOSIS — I10 Essential (primary) hypertension: Secondary | ICD-10-CM | POA: Diagnosis not present

## 2022-09-14 DIAGNOSIS — K7031 Alcoholic cirrhosis of liver with ascites: Secondary | ICD-10-CM | POA: Diagnosis not present

## 2022-09-14 DIAGNOSIS — Z23 Encounter for immunization: Secondary | ICD-10-CM

## 2022-09-14 DIAGNOSIS — I8510 Secondary esophageal varices without bleeding: Secondary | ICD-10-CM | POA: Diagnosis not present

## 2022-09-14 DIAGNOSIS — B20 Human immunodeficiency virus [HIV] disease: Secondary | ICD-10-CM

## 2022-09-14 DIAGNOSIS — B001 Herpesviral vesicular dermatitis: Secondary | ICD-10-CM

## 2022-09-14 DIAGNOSIS — E785 Hyperlipidemia, unspecified: Secondary | ICD-10-CM

## 2022-09-14 DIAGNOSIS — F331 Major depressive disorder, recurrent, moderate: Secondary | ICD-10-CM
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
## 2022-10-15 ENCOUNTER — Encounter: Payer: Self-pay | Admitting: Internal Medicine

## 2022-10-15 ENCOUNTER — Ambulatory Visit: Payer: Medicaid Other | Admitting: Internal Medicine

## 2022-10-15 ENCOUNTER — Ambulatory Visit (INDEPENDENT_AMBULATORY_CARE_PROVIDER_SITE_OTHER): Payer: Medicaid Other | Admitting: Internal Medicine

## 2022-10-15 VITALS — BP 102/58 | HR 82 | Ht 72.0 in | Wt 350.0 lb

## 2022-10-15 DIAGNOSIS — K7682 Hepatic encephalopathy: Secondary | ICD-10-CM | POA: Diagnosis not present

## 2022-10-15 DIAGNOSIS — K7031 Alcoholic cirrhosis of liver with ascites: Secondary | ICD-10-CM

## 2022-10-15 DIAGNOSIS — D509 Iron deficiency anemia, unspecified: Secondary | ICD-10-CM | POA: Diagnosis not present

## 2022-10-15 DIAGNOSIS — K3189 Other diseases of stomach and duodenum: Secondary | ICD-10-CM

## 2022-10-15 DIAGNOSIS — I8511 Secondary esophageal varices with bleeding: Secondary | ICD-10-CM

## 2022-10-15 DIAGNOSIS — K766 Portal hypertension: Secondary | ICD-10-CM

## 2022-10-15 DIAGNOSIS — R161 Splenomegaly, not elsewhere classified: Secondary | ICD-10-CM

## 2022-10-15 MED ORDER — PLENVU 140 G PO SOLR: 1.0000 | ORAL | 0 refills | Status: DC
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
## 2022-10-15 NOTE — Patient Instructions (Signed)
You have been scheduled for an abdominal ultrasound at [facility] on 10/22/2022 at 10:00AM. Please arrive 15 minutes prior to your appointment for registration. Make certain not to have anything to eat or drink 6 hours prior to your appointment. Should you need to reschedule your appointment, please contact radiology at [phone]. This test typically takes about 30 minutes to perform.   You have been scheduled for an endoscopy and colonoscopy. Please follow the written instructions given to you at your visit today. Please pick up your prep supplies at the pharmacy within the next 1-3 days. If you use inhalers (even only as needed), please bring them with you on the day of your procedure.  MAKE SURE AND DO THE BOWEL PURGE 10/31/2022.   I appreciate the opportunity to care for you. Stan Head, MD, [facility]
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
## 2022-10-19 DIAGNOSIS — M533 Sacrococcygeal disorders, not elsewhere classified: Secondary | ICD-10-CM | POA: Insufficient documentation

## 2022-10-19 HISTORY — DX: Sacrococcygeal disorders, not elsewhere classified: M53.3

## 2022-10-22 ENCOUNTER — Ambulatory Visit (HOSPITAL_BASED_OUTPATIENT_CLINIC_OR_DEPARTMENT_OTHER): Admission: RE | Admit: 2022-10-22 | Payer: Medicaid Other | Source: Ambulatory Visit

## 2022-10-26 ENCOUNTER — Encounter (HOSPITAL_COMMUNITY): Payer: Self-pay | Admitting: Internal Medicine

## 2022-10-26 NOTE — Progress Notes (Signed)
Attempted to obtain medical history via telephone, unable to reach at this time. HIPAA compliant voicemail message left requesting return call to pre surgical testing department. 

## 2022-10-29 ENCOUNTER — Telehealth: Payer: Self-pay | Admitting: Internal Medicine

## 2022-10-29 NOTE — Telephone Encounter (Signed)
Patient is returning your call.  

## 2022-10-29 NOTE — Telephone Encounter (Signed)
Patient requesting a call back to discuss prep instructions for procedure at [facility] on 05/21. Please advise, thank you.

## 2022-10-29 NOTE — Telephone Encounter (Signed)
Kierin had questions about the diet to follow for his upcoming procedure. He request I call the RN and tell her to give him a copy of the instructions which I did.

## 2022-10-29 NOTE — Telephone Encounter (Signed)
I left Fahd a detailed message to call me back.

## 2022-10-29 NOTE — Progress Notes (Signed)
Preop instructions for: [name]  Date of Birth:  24-May-1966 Date of Procedure:  11/02/22 Procedure:     Upper Endoscopy and Colonoscopy Surgeon: Dr. Leone Payor Facility contact:   [facility] Phone:  [phone] RN contact name/phone#:  Rosey Bath                    and Fax #: [phone]   Transportation contact phone#: Facility transportation  Time to arrive at [facility]: 2956 am   Report to: Admitting - Go through main entrance of hospital and tell front desk you are having a procedure done, and they will show how to get to admitting dept.   Do not eat solid food past midnight the night before your procedure.(To include any tube feedings-must be discontinued)  May have the following until 0645 am day of procedure  CLEAR LIQUID DIET Water Black Coffee (sugar ok, NO MILK/CREAM OR CREAMERS)  Tea (sugar ok, NO MILK/CREAM OR CREAMERS) regular and decaf                             Plain Jell-O (NO RED)                                           Fruit ices (not with fruit pulp, NO RED)                                     Popsicles (NO RED)                                                                  Juice: apple, WHITE grape, WHITE cranberry Sports drinks like Gatorade (NO RED)   Take these morning medications only with sips of water.(or give through gastrostomy or feeding tube):   Gabapentin, zocor, descovy, pifeltro, nexium  Note: No Insulin or Diabetic meds should be given or taken the morning of the procedure!   Please send day of procedure: current med list and meds last taken that day, confirm nothing by mouth status from what time, Patient Demographic info( to include DNR status, problem list, allergies)   Bring Insurance card and ID, can shower & use deodorant but nothing else on skin. Leave all jewelry and other valuables at place where living ( no metal or rings to be worn) Any questions prior to procedure call pre surg nurse Lyla Son  [phone]  Any questions DAY OF procedure, call ENDO [phone]   Sent from :[facility] Presurgical [facility]     [phone] [phone]

## 2022-11-02 ENCOUNTER — Encounter (HOSPITAL_COMMUNITY)
Admission: RE | Disposition: A | Discharge status: Skilled Nursing Facility | Payer: Self-pay | Source: Home / Self Care | Attending: Internal Medicine

## 2022-11-02 ENCOUNTER — Ambulatory Visit (HOSPITAL_BASED_OUTPATIENT_CLINIC_OR_DEPARTMENT_OTHER): Payer: Medicaid Other | Admitting: Anesthesiology

## 2022-11-02 ENCOUNTER — Encounter (HOSPITAL_COMMUNITY): Payer: Self-pay | Admitting: Internal Medicine

## 2022-11-02 ENCOUNTER — Ambulatory Visit (HOSPITAL_COMMUNITY)
Admission: RE | Admit: 2022-11-02 | Discharge: 2022-11-02 | Disposition: A | Discharge status: Skilled Nursing Facility | Payer: Medicaid Other | Attending: Internal Medicine | Admitting: Internal Medicine

## 2022-11-02 ENCOUNTER — Ambulatory Visit (HOSPITAL_COMMUNITY): Payer: Medicaid Other | Admitting: Anesthesiology

## 2022-11-02 ENCOUNTER — Other Ambulatory Visit: Payer: Self-pay

## 2022-11-02 DIAGNOSIS — K3189 Other diseases of stomach and duodenum: Secondary | ICD-10-CM | POA: Insufficient documentation

## 2022-11-02 DIAGNOSIS — K31819 Angiodysplasia of stomach and duodenum without bleeding: Secondary | ICD-10-CM | POA: Diagnosis not present

## 2022-11-02 DIAGNOSIS — D509 Iron deficiency anemia, unspecified: Secondary | ICD-10-CM | POA: Diagnosis present

## 2022-11-02 DIAGNOSIS — Z8711 Personal history of peptic ulcer disease: Secondary | ICD-10-CM | POA: Insufficient documentation

## 2022-11-02 DIAGNOSIS — I10 Essential (primary) hypertension: Secondary | ICD-10-CM | POA: Diagnosis not present

## 2022-11-02 DIAGNOSIS — Z6841 Body Mass Index (BMI) 40.0 and over, adult: Secondary | ICD-10-CM | POA: Diagnosis not present

## 2022-11-02 DIAGNOSIS — D589 Hereditary hemolytic anemia, unspecified: Secondary | ICD-10-CM

## 2022-11-02 DIAGNOSIS — K7031 Alcoholic cirrhosis of liver with ascites: Secondary | ICD-10-CM | POA: Diagnosis not present

## 2022-11-02 DIAGNOSIS — F419 Anxiety disorder, unspecified: Secondary | ICD-10-CM | POA: Diagnosis not present

## 2022-11-02 DIAGNOSIS — D759 Disease of blood and blood-forming organs, unspecified: Secondary | ICD-10-CM | POA: Diagnosis not present

## 2022-11-02 DIAGNOSIS — J45909 Unspecified asthma, uncomplicated: Secondary | ICD-10-CM

## 2022-11-02 DIAGNOSIS — I8500 Esophageal varices without bleeding: Secondary | ICD-10-CM | POA: Diagnosis not present

## 2022-11-02 DIAGNOSIS — K31811 Angiodysplasia of stomach and duodenum with bleeding: Secondary | ICD-10-CM | POA: Diagnosis not present

## 2022-11-02 DIAGNOSIS — Z87891 Personal history of nicotine dependence: Secondary | ICD-10-CM

## 2022-11-02 DIAGNOSIS — F32A Depression, unspecified: Secondary | ICD-10-CM | POA: Diagnosis not present

## 2022-11-02 DIAGNOSIS — K219 Gastro-esophageal reflux disease without esophagitis: Secondary | ICD-10-CM | POA: Insufficient documentation

## 2022-11-02 DIAGNOSIS — K766 Portal hypertension: Secondary | ICD-10-CM | POA: Diagnosis not present

## 2022-11-02 DIAGNOSIS — K7030 Alcoholic cirrhosis of liver without ascites: Secondary | ICD-10-CM | POA: Diagnosis not present

## 2022-11-02 DIAGNOSIS — I8511 Secondary esophageal varices with bleeding: Secondary | ICD-10-CM | POA: Diagnosis not present

## 2022-11-02 DIAGNOSIS — K7682 Hepatic encephalopathy: Secondary | ICD-10-CM

## 2022-11-02 HISTORY — PX: ESOPHAGOGASTRODUODENOSCOPY (EGD) WITH PROPOFOL: SHX5813

## 2022-11-02 HISTORY — PX: COLONOSCOPY WITH PROPOFOL: SHX5780

## 2022-11-02 LAB — CBC
HCT: 45.4 % (ref 39.0–52.0)
Hemoglobin: 14.5 g/dL (ref 13.0–17.0)
MCH: 29.2 pg (ref 26.0–34.0)
MCHC: 31.9 g/dL (ref 30.0–36.0)
MCV: 91.5 fL (ref 80.0–100.0)
Platelets: 85 10*3/uL — ABNORMAL LOW (ref 150–400)
RBC: 4.96 MIL/uL (ref 4.22–5.81)
RDW: 17.5 % — ABNORMAL HIGH (ref 11.5–15.5)
WBC: 4.6 10*3/uL (ref 4.0–10.5)
nRBC: 0 % (ref 0.0–0.2)

## 2022-11-02 LAB — FERRITIN: Ferritin: 22 ng/mL — ABNORMAL LOW (ref 24–336)

## 2022-11-02 SURGERY — ESOPHAGOGASTRODUODENOSCOPY (EGD) WITH PROPOFOL
Anesthesia: Monitor Anesthesia Care

## 2022-11-02 MED ORDER — MIDAZOLAM HCL 2 MG/2ML IJ SOLN
INTRAMUSCULAR | Status: AC
  Filled 2022-11-02: qty 2

## 2022-11-02 MED ORDER — MIDAZOLAM HCL 5 MG/5ML IJ SOLN
INTRAMUSCULAR | Status: DC | PRN
  Administered 2022-11-02: 2 mg via INTRAVENOUS

## 2022-11-02 MED ORDER — PROPOFOL 500 MG/50ML IV EMUL
INTRAVENOUS | Status: DC | PRN
  Administered 2022-11-02: 110 ug/kg/min via INTRAVENOUS

## 2022-11-02 MED ORDER — PHENYLEPHRINE HCL (PRESSORS) 10 MG/ML IV SOLN
INTRAVENOUS | Status: DC | PRN
  Administered 2022-11-02: 100 ug via INTRAVENOUS

## 2022-11-02 MED ORDER — LIDOCAINE HCL (PF) 2 % IJ SOLN
INTRAMUSCULAR | Status: DC | PRN
  Administered 2022-11-02: 100 mg via INTRADERMAL

## 2022-11-02 MED ORDER — SODIUM CHLORIDE 0.9 % IV SOLN: INTRAVENOUS | Status: DC

## 2022-11-02 MED ORDER — ONDANSETRON HCL 4 MG/2ML IJ SOLN
INTRAMUSCULAR | Status: DC | PRN
  Administered 2022-11-02: 4 mg via INTRAVENOUS

## 2022-11-02 MED ORDER — DEXAMETHASONE SODIUM PHOSPHATE 10 MG/ML IJ SOLN
INTRAMUSCULAR | Status: DC | PRN
  Administered 2022-11-02: 8 mg via INTRAVENOUS

## 2022-11-02 MED ORDER — PROPOFOL 1000 MG/100ML IV EMUL
INTRAVENOUS | Status: AC
  Filled 2022-11-02: qty 100

## 2022-11-02 MED ORDER — LACTATED RINGERS IV SOLN: INTRAVENOUS | Status: DC

## 2022-11-02 MED ORDER — PROPOFOL 10 MG/ML IV BOLUS
INTRAVENOUS | Status: DC | PRN
  Administered 2022-11-02: 80 mg via INTRAVENOUS

## 2022-11-02 SURGICAL SUPPLY — 25 items

## 2022-11-02 NOTE — Interval H&P Note (Signed)
History and Physical Interval Note:  11/02/2022 10:10 AM  [name]  has presented today for surgery, with the diagnosis of IDA, alcoholic cirrhosis, secondary esophageal varices with bleeding.  The various methods of treatment have been discussed with the patient and family. After consideration of risks, benefits and other options for treatment, the patient has consented to  Procedure(s): COLONOSCOPY WITH PROPOFOL (N/A) ESOPHAGOGASTRODUODENOSCOPY (EGD) WITH PROPOFOL (N/A) as a surgical intervention.  The patient's history has been reviewed, patient examined, no change in status, stable for surgery.  I have reviewed the patient's chart and labs.  Questions were answered to the patient's satisfaction.     Stan Head

## 2022-11-02 NOTE — Op Note (Signed)
[facility] Patient Name: [name] Procedure Date: 11/02/2022 MRN: [id number] Attending MD: Iva Boop , MD, [id number] Date of Birth: 1965-07-12 CSN: [id number] Age: 57 Admit Type: Outpatient Procedure:                Upper GI endoscopy Indications:              Iron deficiency anemia Providers:                Iva Boop, MD, Janae Sauce. Steele Berg, RN, Leanne Lovely, Technician Referring MD:              Medicines:                Monitored Anesthesia Care Complications:            No immediate complications. Estimated Blood Loss:     Estimated blood loss: none. Procedure:                Pre-Anesthesia Assessment:                           - Prior to the procedure, a History and Physical                            was performed, and patient medications and                            allergies were reviewed. The patient's tolerance of                            previous anesthesia was also reviewed. The risks                            and benefits of the procedure and the sedation                            options and risks were discussed with the patient.                            All questions were answered, and informed consent                            was obtained. Prior Anticoagulants: The patient has                            taken no anticoagulant or antiplatelet agents. ASA                            Grade Assessment: III - A patient with severe                            systemic disease. After reviewing the risks and  benefits, the patient was deemed in satisfactory                            condition to undergo the procedure.                           After obtaining informed consent, the endoscope was                            passed under direct vision. Throughout the                            procedure, the patient's blood pressure, pulse, and                            oxygen  saturations were monitored continuously. The                            [id number]-[id number] ([id number]) Olympus endoscope was introduced                            through the mouth, and advanced to the second part                            of duodenum. The upper GI endoscopy was                            accomplished without difficulty. The patient                            tolerated the procedure well. Scope In: Scope Out: Findings:      Grade I varices were found in the mid esophagus and in the distal       esophagus. They were 4 mm in largest diameter.      Portal hypertensive gastropathy was found in the entire examined       stomach. Focal radiofrequency ablation of gastric antral vascular       ectasia in the gastric antrum was performed. With the endoscope in       place, the position and extent of the abnormal mucosa and appropriate       anatomic landmarks were noted. The radiofrequency channel ablation       catheter was introduced through the endoscope working channel. The       endoscope with the ablation catheter was advanced to the areas of       abnormal mucosa. The endoscope with the channel ablation catheter was       positioned under direct visualization so that the catheter was placed in       contact with the surface of the abnormal mucosa. Energy was applied. The       ablation catheter was removed through the endoscope working channel. The       areas where abnormal mucosa had been ablated were examined. Whitish       changes of ablated mucosa were present. Estimated blood loss: none.      The examined duodenum was normal.      The cardia  and gastric fundus were normal on retroflexion.      The exam was otherwise without abnormality. Impression:               - Grade I esophageal varices.                           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                           -  Normal examined duodenum.                           - The examination was otherwise normal except                            slight retention of food vs medication material                            which was lavaged away. Should not have had solid                            food pre-colonoscopy if it was food.                           - No specimens collected. Moderate Sedation:      Not Applicable - Patient had care per Anesthesia. Recommendation:           - Patient has a contact number available for                            emergencies. The signs and symptoms of potential                            delayed complications were discussed with the                            patient. Return to normal activities tomorrow.                            Written discharge instructions were provided to the                            patient.                           - See the other procedure note for documentation of                            additional recommendations. Am checking CBC and                            ferritin today Procedure Code(s):        ---  Professional ---                           [phone], Esophagogastroduodenoscopy, flexible,                            transoral; with control of bleeding, any method Diagnosis Code(s):        --- Professional ---                           I85.00, Esophageal varices without bleeding                           K76.6, Portal hypertension                           K31.89, Other diseases of stomach and duodenum                           D50.9, Iron deficiency anemia, unspecified CPT copyright 2022 American Medical Association. All rights reserved. The codes documented in this report are preliminary and upon coder review may  be revised to meet current compliance requirements. Iva Boop, MD 11/02/2022 11:27:44 AM This report has been signed electronically. Number of Addenda: 0

## 2022-11-02 NOTE — Anesthesia Preprocedure Evaluation (Addendum)
Anesthesia Evaluation  Patient identified by MRN, date of birth, ID band Patient awake    Reviewed: Allergy & Precautions, NPO status , Patient's Chart, lab work & pertinent test results  Airway Mallampati: I  TM Distance: >3 FB Neck ROM: Full    Dental  (+) Teeth Intact, Dental Advisory Given   Pulmonary asthma , former smoker   Pulmonary exam normal breath sounds clear to auscultation       Cardiovascular hypertension, Pt. on medications Normal cardiovascular exam Rhythm:Regular Rate:Normal     Neuro/Psych  PSYCHIATRIC DISORDERS Anxiety Depression     Neuromuscular disease    GI/Hepatic PUD,GERD  Medicated,,(+) Cirrhosis   Esophageal Varices  substance abuse  alcohol use  Endo/Other    Morbid obesity  Renal/GU negative Renal ROS     Musculoskeletal negative musculoskeletal ROS (+)    Abdominal   Peds  Hematology  (+) Blood dyscrasia, anemia   Anesthesia Other Findings Day of surgery medications reviewed with the patient.  Reproductive/Obstetrics                             Anesthesia Physical Anesthesia Plan  ASA: 3  Anesthesia Plan: MAC   Post-op Pain Management:    Induction: Intravenous  PONV Risk Score and Plan: 1 and TIVA and Treatment may vary due to age or medical condition  Airway Management Planned: Natural Airway and Simple Face Mask  Additional Equipment:   Intra-op Plan:   Post-operative Plan:   Informed Consent: I have reviewed the patients History and Physical, chart, labs and discussed the procedure including the risks, benefits and alternatives for the proposed anesthesia with the patient or authorized representative who has indicated his/her understanding and acceptance.     Dental advisory given  Plan Discussed with: CRNA and Anesthesiologist  Anesthesia Plan Comments:         Anesthesia Quick Evaluation

## 2022-11-02 NOTE — Op Note (Signed)
[facility] Patient Name: [name] Procedure Date: 11/02/2022 MRN: [id number] Attending MD: Iva Boop , MD, [id number] Date of Birth: 1965-10-31 CSN: [id number] Age: 57 Admit Type: Outpatient Procedure:                Colonoscopy Indications:              Iron deficiency anemia Providers:                Iva Boop, MD, Janae Sauce. Steele Berg, RN, Leanne Lovely, Technician Referring MD:              Medicines:                Monitored Anesthesia Care Complications:            No immediate complications. Estimated Blood Loss:     Estimated blood loss: none. Procedure:                Pre-Anesthesia Assessment:                           - Prior to the procedure, a History and Physical                            was performed, and patient medications and                            allergies were reviewed. The patient's tolerance of                            previous anesthesia was also reviewed. The risks                            and benefits of the procedure and the sedation                            options and risks were discussed with the patient.                            All questions were answered, and informed consent                            was obtained. Prior Anticoagulants: The patient has                            taken no anticoagulant or antiplatelet agents. ASA                            Grade Assessment: III - A patient with severe                            systemic disease. After reviewing the risks and  benefits, the patient was deemed in satisfactory                            condition to undergo the procedure.                           After obtaining informed consent, the colonoscope                            was passed under direct vision. Throughout the                            procedure, the patient's blood pressure, pulse, and                            oxygen saturations  were monitored continuously. The                            [id number]-[id number] ([id number]) Olympus colonoscope was                            introduced through the anus with the intention of                            advancing to the cecum. The scope was advanced to                            the sigmoid colon before the procedure was aborted.                            Medications were given. The colonoscopy was                            performed with difficulty due to inadequate bowel                            prep. The patient tolerated the procedure well. The                            quality of the bowel preparation was poor. The                            colonoscopy was aborted due to poor bowel prep. Scope In: 10:57:26 AM Scope Out: 10:57:48 AM Total Procedure Duration: 0 hours 0 minutes 22 seconds  Findings:      The perianal and digital rectal examinations were normal.      Extensive amounts of semi-solid stool was found in the rectum and in the       sigmoid colon, precluding visualization. Impression:               - Preparation of the colon was poor.                           - The procedure was aborted due to poor bowel  prep.                           - Stool in the rectum and in the sigmoid colon.                           - No specimens collected. Moderate Sedation:      Not Applicable - Patient had care per Anesthesia. Recommendation:           - Patient has a contact number available for                            emergencies. The signs and symptoms of potential                            delayed complications were discussed with the                            patient. Return to normal activities tomorrow.                            Written discharge instructions were provided to the                            patient.                           - Resume previous diet.                           - Continue present medications.                           - No recommendation at  this time regarding repeat                            colonoscopy. Last year had a fair prep - he said                            today that he did not receive second half of                            Plenvu. Given findings at [facility] and difficulties with                            preparation for colonoscopy not inclined to repeat                            colonoscopy. Procedure Code(s):        --- Professional ---                           [phone], 53, Colonoscopy, flexible; diagnostic,                            including collection  of specimen(s) by brushing or                            washing, when performed (separate procedure) Diagnosis Code(s):        --- Professional ---                           D50.9, Iron deficiency anemia, unspecified CPT copyright 2022 American Medical Association. All rights reserved. The codes documented in this report are preliminary and upon coder review may  be revised to meet current compliance requirements. Iva Boop, MD 11/02/2022 11:32:02 AM This report has been signed electronically. Number of Addenda: 0

## 2022-11-02 NOTE — Discharge Instructions (Addendum)
I saw abnormal stomach lining that you are leaking blood from and I cauterized it.  The colonoscopy was not possible as you were not cleaned out again.   I am checking labs and will let you know results and plans.  I appreciate the opportunity to care for you. Iva Boop, MD, FACG  YOU HAD AN ENDOSCOPIC PROCEDURE TODAY: Refer to the procedure report and other information in the discharge instructions given to you for any specific questions about what was found during the examination. If this information does not answer your questions, please call Dr. Marvell Fuller office at [phone] to clarify.   YOU SHOULD EXPECT: Some feelings of bloating in the abdomen. Passage of more gas than usual. Walking can help get rid of the air that was put into your GI tract during the procedure and reduce the bloating. If you had a lower endoscopy (such as a colonoscopy or flexible sigmoidoscopy) you may notice spotting of blood in your stool or on the toilet paper. Some abdominal soreness may be present for a day or two, also.  DIET: Your first meal following the procedure should be a light meal and then it is ok to progress to your normal diet. A half-sandwich or bowl of soup is an example of a good first meal. Heavy or fried foods are harder to digest and may make you feel nauseous or bloated. Drink plenty of fluids but you should avoid alcoholic beverages for 24 hours.   ACTIVITY: Your care partner should take you home directly after the procedure. You should plan to take it easy, moving slowly for the rest of the day. You can resume normal activity the day after the procedure however YOU SHOULD NOT DRIVE, use power tools, machinery or perform tasks that involve climbing or major physical exertion for 24 hours (because of the sedation medicines used during the test).   SYMPTOMS TO REPORT IMMEDIATELY: A gastroenterologist can be reached at any hour. Please call [phone]  for any of the following symptoms:   Following lower endoscopy (colonoscopy, flexible sigmoidoscopy) Excessive amounts of blood in the stool  Significant tenderness, worsening of abdominal pains  Swelling of the abdomen that is new, acute  Fever of 100 or higher  Following upper endoscopy (EGD, EUS, ERCP, esophageal dilation) Vomiting of blood or coffee ground material  New, significant abdominal pain  New, significant chest pain or pain under the shoulder blades  Painful or persistently difficult swallowing  New shortness of breath  Black, tarry-looking or red, bloody stools  FOLLOW UP:  If any biopsies were taken you will be contacted by phone or by letter within the next 1-3 weeks. Call [phone]  if you have not heard about the biopsies in 3 weeks.  Please also call with any specific questions about appointments or follow up tests.

## 2022-11-02 NOTE — Transfer of Care (Signed)
Immediate Anesthesia Transfer of Care Note  Patient: [name]  Procedure(s) Performed: ESOPHAGOGASTRODUODENOSCOPY (EGD) WITH PROPOFOL (RADIOFREQUENCY) ABLATION  Patient Location: PACU and Endoscopy Unit  Anesthesia Type:MAC  Level of Consciousness: awake, alert , oriented, and patient cooperative  Airway & Oxygen Therapy: Patient Spontanous Breathing and Patient connected to face mask oxygen  Post-op Assessment: Report given to RN, Post -op Vital signs reviewed and stable, and Patient moving all extremities  Post vital signs: Reviewed and stable  Last Vitals:  Vitals Value Taken Time  BP    Temp    Pulse    Resp    SpO2      Last Pain:  Vitals:   11/02/22 0944  TempSrc: Temporal  PainSc: 8          Complications: No notable events documented.

## 2022-11-03 ENCOUNTER — Encounter (HOSPITAL_COMMUNITY): Payer: Self-pay | Admitting: Internal Medicine

## 2022-11-03 NOTE — Anesthesia Postprocedure Evaluation (Signed)
Anesthesia Post Note  Patient: [name]  Procedure(s) Performed: ESOPHAGOGASTRODUODENOSCOPY (EGD) WITH PROPOFOL (RADIOFREQUENCY) ABLATION     Patient location during evaluation: Endoscopy Anesthesia Type: MAC Level of consciousness: oriented, awake and alert and awake Pain management: pain level controlled Vital Signs Assessment: post-procedure vital signs reviewed and stable Respiratory status: spontaneous breathing, nonlabored ventilation, respiratory function stable and patient connected to nasal cannula oxygen Cardiovascular status: blood pressure returned to baseline and stable Postop Assessment: no headache, no backache and no apparent nausea or vomiting Anesthetic complications: no   No notable events documented.  Last Vitals:  Vitals:   11/02/22 1140 11/02/22 1141  BP:  123/70  Pulse: 76 74  Resp: 13 (!) 0  Temp:    SpO2: 98% 95%    Last Pain:  Vitals:   11/02/22 1141  TempSrc:   PainSc: 0-No pain                 Collene Schlichter

## 2023-01-07 ENCOUNTER — Telehealth: Payer: Self-pay

## 2023-01-07 NOTE — Telephone Encounter (Signed)
Patient called stating that he is having some dental pain and now has a rash on the side of his face. Patient was concerned it is a herpes outbreak and requested a RX for Zovirax. Patient is currently at [facility] in [facility]. I advised patient to inform the nurses there that he may need to see a provider to be evaluated. But I would also send a message back to the provider covering Dr.Van Dam.    Marcell Anger, CMA

## 2023-01-19 ENCOUNTER — Other Ambulatory Visit: Payer: Self-pay

## 2023-01-19 ENCOUNTER — Ambulatory Visit (INDEPENDENT_AMBULATORY_CARE_PROVIDER_SITE_OTHER): Payer: Medicaid Other | Admitting: Infectious Disease

## 2023-01-19 ENCOUNTER — Encounter: Payer: Self-pay | Admitting: Infectious Disease

## 2023-01-19 VITALS — BP 138/67 | HR 84 | Temp 97.7°F | Ht 72.0 in | Wt 360.0 lb

## 2023-01-19 DIAGNOSIS — B20 Human immunodeficiency virus [HIV] disease: Secondary | ICD-10-CM | POA: Diagnosis not present

## 2023-01-19 DIAGNOSIS — R0609 Other forms of dyspnea: Secondary | ICD-10-CM | POA: Diagnosis not present

## 2023-01-19 DIAGNOSIS — R635 Abnormal weight gain: Secondary | ICD-10-CM | POA: Diagnosis not present

## 2023-01-19 DIAGNOSIS — E785 Hyperlipidemia, unspecified: Secondary | ICD-10-CM

## 2023-01-19 DIAGNOSIS — F331 Major depressive disorder, recurrent, moderate: Secondary | ICD-10-CM

## 2023-01-19 DIAGNOSIS — K7031 Alcoholic cirrhosis of liver with ascites: Secondary | ICD-10-CM | POA: Diagnosis not present

## 2023-01-19 DIAGNOSIS — D61818 Other pancytopenia: Secondary | ICD-10-CM

## 2023-01-19 HISTORY — DX: Abnormal weight gain: R63.5

## 2023-01-19 HISTORY — DX: Other forms of dyspnea: R06.09

## 2023-01-19 LAB — CBC WITH DIFFERENTIAL/PLATELET
Absolute Monocytes: 494 cells/uL (ref 200–950)
Basophils Absolute: 31 cells/uL (ref 0–200)
Basophils Relative: 0.6 %
Eosinophils Absolute: 218 cells/uL (ref 15–500)
Eosinophils Relative: 4.2 %
HCT: 46.6 % (ref 38.5–50.0)
Hemoglobin: 15.9 g/dL (ref 13.2–17.1)
Lymphs Abs: 1352 cells/uL (ref 850–3900)
MCH: 31.9 pg (ref 27.0–33.0)
MCHC: 34.1 g/dL (ref 32.0–36.0)
MCV: 93.4 fL (ref 80.0–100.0)
MPV: 11.4 fL (ref 7.5–12.5)
Monocytes Relative: 9.5 %
Neutro Abs: 3104 cells/uL (ref 1500–7800)
Neutrophils Relative %: 59.7 %
Platelets: 84 10*3/uL — ABNORMAL LOW (ref 140–400)
RBC: 4.99 10*6/uL (ref 4.20–5.80)
RDW: 14.6 % (ref 11.0–15.0)
Total Lymphocyte: 26 %
WBC: 5.2 10*3/uL (ref 3.8–10.8)
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
## 2023-03-04 ENCOUNTER — Ambulatory Visit: Payer: Medicaid Other | Admitting: Internal Medicine

## 2023-03-08 DIAGNOSIS — K219 Gastro-esophageal reflux disease without esophagitis: Secondary | ICD-10-CM | POA: Insufficient documentation

## 2023-03-08 DIAGNOSIS — J45909 Unspecified asthma, uncomplicated: Secondary | ICD-10-CM | POA: Insufficient documentation

## 2023-03-08 DIAGNOSIS — D759 Disease of blood and blood-forming organs, unspecified: Secondary | ICD-10-CM | POA: Insufficient documentation

## 2023-03-08 DIAGNOSIS — S4990XA Unspecified injury of shoulder and upper arm, unspecified arm, initial encounter: Secondary | ICD-10-CM | POA: Insufficient documentation

## 2023-03-08 DIAGNOSIS — L089 Local infection of the skin and subcutaneous tissue, unspecified: Secondary | ICD-10-CM | POA: Insufficient documentation

## 2023-03-08 DIAGNOSIS — C4490 Unspecified malignant neoplasm of skin, unspecified: Secondary | ICD-10-CM | POA: Insufficient documentation

## 2023-03-08 DIAGNOSIS — F1020 Alcohol dependence, uncomplicated: Secondary | ICD-10-CM | POA: Insufficient documentation

## 2023-03-08 DIAGNOSIS — E669 Obesity, unspecified: Secondary | ICD-10-CM | POA: Insufficient documentation

## 2023-03-09 ENCOUNTER — Ambulatory Visit: Payer: Medicaid Other | Attending: Cardiology | Admitting: Cardiology

## 2023-03-31 ENCOUNTER — Telehealth: Payer: Self-pay

## 2023-03-31 NOTE — Telephone Encounter (Signed)
Patient called stating that his throat has been itching and scratchy. Concerned about esophagus varices. Patient wondering if he needs another endoscopy. Advised patient he would probably need to reach out to his GI doctor to follow up and I provided him with the phone number and will send a message to Dr.Van Dam as well.    Meria Crilly Lesli Albee, CMA

## 2023-04-01 NOTE — Telephone Encounter (Signed)
Patient aware.

## 2023-04-06 ENCOUNTER — Telehealth: Payer: Self-pay | Admitting: Internal Medicine

## 2023-04-06 ENCOUNTER — Telehealth: Payer: Self-pay

## 2023-04-06 NOTE — Telephone Encounter (Signed)
Pt stated that he has a tickle in his throat sometime that last for 5 minutes and then goes away. No difficulty swallowing or bleeding. Pt was notified that he does have an office visit that has been scheduled on 06/27/2023 at 9:30 am with Dr. Leone Payor  and this can be discussed at the time of the office visit for the Dr to evaluate.  Pt verbalized understanding with all questions answered.

## 2023-04-06 NOTE — Telephone Encounter (Signed)
Patient called stated he needs to speak with a nurse regarding how he can prep for a procedure, also stated he has like a tickle on the back of his throat. Please advise.

## 2023-04-06 NOTE — Telephone Encounter (Signed)
Patient called office requesting referral to Urology. States for the past two months he has noticed after he works out his groin feels "slimy" is also complaing of "fishy" smell. Is worried he might have UTI. Denies increase urination, burning, abdominal pain.  Patient is mainly concerned others around him have noticed the smell and would like work up. Was previously seeing a Insurance underwriter in [place], but does not remember the providers name. Does not want to go back to that provider if possible. Would like new referral.  Updated patient phone number for follow up.  [phone] Juanita Laster, RMA

## 2023-04-07 ENCOUNTER — Other Ambulatory Visit: Payer: Self-pay

## 2023-04-07 ENCOUNTER — Other Ambulatory Visit: Payer: Self-pay | Admitting: Infectious Disease

## 2023-04-07 MED ORDER — NYSTATIN 100000 UNIT/GM EX POWD: 1.0000 | Freq: Three times a day (TID) | CUTANEOUS | 5 refills | Status: DC

## 2023-04-07 NOTE — Telephone Encounter (Signed)
Spoke with patient and would like to know if we can call in prescription to [facility] who can fill this for patient. Will need prescription to fax to facility.  Juanita Laster, RMA

## 2023-04-07 NOTE — Telephone Encounter (Signed)
Spoke with Musu, RN  at SNF who provided fax number to pharmacy.  Will fax prescription.  F [phone] Juanita Laster, RMA

## 2023-04-08 ENCOUNTER — Other Ambulatory Visit: Payer: Self-pay

## 2023-04-08 MED ORDER — NYSTATIN 100000 UNIT/GM EX POWD: 1.0000 | Freq: Three times a day (TID) | CUTANEOUS | 5 refills | Status: AC

## 2023-04-25 DIAGNOSIS — M5410 Radiculopathy, site unspecified: Secondary | ICD-10-CM | POA: Insufficient documentation

## 2023-05-05 ENCOUNTER — Ambulatory Visit: Payer: Medicaid Other | Admitting: Cardiology

## 2023-05-24 ENCOUNTER — Other Ambulatory Visit (HOSPITAL_COMMUNITY)
Admission: RE | Admit: 2023-05-24 | Discharge: 2023-05-24 | Disposition: A | Discharge status: Home / Self Care | Payer: Medicaid Other | Source: Ambulatory Visit | Attending: Infectious Disease | Admitting: Infectious Disease

## 2023-05-24 ENCOUNTER — Ambulatory Visit (INDEPENDENT_AMBULATORY_CARE_PROVIDER_SITE_OTHER): Payer: Medicaid Other | Admitting: Infectious Disease

## 2023-05-24 ENCOUNTER — Other Ambulatory Visit: Payer: Self-pay

## 2023-05-24 VITALS — BP 137/82 | HR 71 | Temp 97.9°F | Ht 71.0 in | Wt 350.0 lb

## 2023-05-24 DIAGNOSIS — F331 Major depressive disorder, recurrent, moderate: Secondary | ICD-10-CM | POA: Insufficient documentation

## 2023-05-24 DIAGNOSIS — Z23 Encounter for immunization: Secondary | ICD-10-CM

## 2023-05-24 DIAGNOSIS — B20 Human immunodeficiency virus [HIV] disease: Secondary | ICD-10-CM | POA: Insufficient documentation

## 2023-05-24 DIAGNOSIS — M545 Low back pain, unspecified: Secondary | ICD-10-CM | POA: Diagnosis not present

## 2023-05-24 DIAGNOSIS — E785 Hyperlipidemia, unspecified: Secondary | ICD-10-CM

## 2023-05-24 DIAGNOSIS — D61818 Other pancytopenia: Secondary | ICD-10-CM

## 2023-05-24 DIAGNOSIS — E66813 Obesity, class 3: Secondary | ICD-10-CM

## 2023-05-24 DIAGNOSIS — R45851 Suicidal ideations: Secondary | ICD-10-CM

## 2023-05-24 DIAGNOSIS — I8511 Secondary esophageal varices with bleeding: Secondary | ICD-10-CM

## 2023-05-24 DIAGNOSIS — K7031 Alcoholic cirrhosis of liver with ascites: Secondary | ICD-10-CM

## 2023-05-24 DIAGNOSIS — G8929 Other chronic pain: Secondary | ICD-10-CM | POA: Diagnosis not present

## 2023-05-24 DIAGNOSIS — Z7185 Encounter for immunization safety counseling: Secondary | ICD-10-CM

## 2023-05-24 MED ORDER — ATORVASTATIN CALCIUM 10 MG PO TABS: 10.0000 mg | ORAL_TABLET | Freq: Every day | ORAL | 11 refills | Status: DC

## 2023-05-24 MED ORDER — DESCOVY 200-25 MG PO TABS: 1.0000 | ORAL_TABLET | Freq: Every day | ORAL | 11 refills | Status: DC

## 2023-05-24 MED ORDER — DORAVIRINE 100 MG PO TABS: 100.0000 mg | ORAL_TABLET | Freq: Every day | ORAL | 11 refills | Status: DC
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
## 2023-05-24 NOTE — Addendum Note (Signed)
Addended by: Linna Hoff D on: 05/24/2023 11:42 AM   Modules accepted: Orders

## 2023-05-25 LAB — URINE CYTOLOGY ANCILLARY ONLY
Chlamydia: NEGATIVE
Comment: NEGATIVE
Comment: NORMAL
Neisseria Gonorrhea: NEGATIVE

## 2023-05-25 LAB — T-HELPER CELLS (CD4) COUNT (NOT AT ARMC)
CD4 % Helper T Cell: 30 % — ABNORMAL LOW (ref 33–65)
CD4 T Cell Abs: 260 /uL — ABNORMAL LOW (ref 400–1790)

## 2023-05-27 LAB — LIPID PANEL
Cholesterol: 165 mg/dL (ref <200)
HDL: 87 mg/dL (ref >=40)
LDL Cholesterol (Calc): 63 mg/dL
Non-HDL Cholesterol (Calc): 78 mg/dL (ref <130)
Total CHOL/HDL Ratio: 1.9 (calc) (ref <5.0)
Triglycerides: 66 mg/dL (ref <150)

## 2023-05-27 LAB — COMPLETE METABOLIC PANEL WITH GFR
AG Ratio: 2 (calc) (ref 1.0–2.5)
ALT: 23 U/L (ref 9–46)
AST: 30 U/L (ref 10–35)
Albumin: 3.9 g/dL (ref 3.6–5.1)
Alkaline phosphatase (APISO): 88 U/L (ref 35–144)
BUN: 18 mg/dL (ref 7–25)
CO2: 27 mmol/L (ref 20–32)
Calcium: 9.2 mg/dL (ref 8.6–10.3)
Chloride: 103 mmol/L (ref 98–110)
Creat: 0.99 mg/dL (ref 0.70–1.30)
Globulin: 2 g/dL (ref 1.9–3.7)
Glucose, Bld: 89 mg/dL (ref 65–99)
Potassium: 4.9 mmol/L (ref 3.5–5.3)
Sodium: 139 mmol/L (ref 135–146)
Total Bilirubin: 1.5 mg/dL — ABNORMAL HIGH (ref 0.2–1.2)
Total Protein: 5.9 g/dL — ABNORMAL LOW (ref 6.1–8.1)
eGFR: 88 mL/min/{1.73_m2} (ref >=60)

## 2023-05-27 LAB — CBC WITH DIFFERENTIAL/PLATELET
Absolute Lymphocytes: 887 {cells}/uL (ref 850–3900)
Absolute Monocytes: 347 {cells}/uL (ref 200–950)
Basophils Absolute: 32 {cells}/uL (ref 0–200)
Basophils Relative: 0.7 %
Eosinophils Absolute: 189 {cells}/uL (ref 15–500)
Eosinophils Relative: 4.2 %
HCT: 47 % (ref 38.5–50.0)
Hemoglobin: 16.1 g/dL (ref 13.2–17.1)
MCH: 33.3 pg — ABNORMAL HIGH (ref 27.0–33.0)
MCHC: 34.3 g/dL (ref 32.0–36.0)
MCV: 97.1 fL (ref 80.0–100.0)
MPV: 11.1 fL (ref 7.5–12.5)
Monocytes Relative: 7.7 %
Neutro Abs: 3047 {cells}/uL (ref 1500–7800)
Neutrophils Relative %: 67.7 %
Platelets: 75 10*3/uL — ABNORMAL LOW (ref 140–400)
RBC: 4.84 10*6/uL (ref 4.20–5.80)
RDW: 12.7 % (ref 11.0–15.0)
Total Lymphocyte: 19.7 %
WBC: 4.5 10*3/uL (ref 3.8–10.8)

## 2023-05-27 LAB — CYTOLOGY - PAP
Adequacy: ABSENT
Comment: NEGATIVE
Diagnosis: UNDETERMINED — AB
High risk HPV: POSITIVE — AB

## 2023-05-27 LAB — HIV-1 RNA QUANT-NO REFLEX-BLD
HIV 1 RNA Quant: NOT DETECTED {copies}/mL
HIV-1 RNA Quant, Log: NOT DETECTED {Log}

## 2023-05-27 LAB — RPR: RPR Ser Ql: NONREACTIVE

## 2023-05-30 ENCOUNTER — Other Ambulatory Visit: Payer: Self-pay | Admitting: Infectious Disease

## 2023-05-30 ENCOUNTER — Telehealth: Payer: Self-pay

## 2023-05-30 DIAGNOSIS — R85610 Atypical squamous cells of undetermined significance on cytologic smear of anus (ASC-US): Secondary | ICD-10-CM

## 2023-05-30 NOTE — Telephone Encounter (Signed)
Called patient regarding results for anal pap. Understands that he is being referred to General Surgery for follow up. Provided patient with contact information for office to schedule appt. Juanita Laster, RMA

## 2023-05-30 NOTE — Telephone Encounter (Signed)
-----   Message from [place] sent at 05/30/2023 12:29 PM EST ----- ASCUS on anal pap. I put referral to Dr. Maisie Fus in Epic he is in a group home ----- Message ----- From: Interface, [facility] Results In Sent: 05/25/2023  12:34 AM EST To: Randall Hiss, MD

## 2023-06-27 ENCOUNTER — Ambulatory Visit: Payer: Medicaid Other | Admitting: Internal Medicine

## 2023-06-27 ENCOUNTER — Other Ambulatory Visit (INDEPENDENT_AMBULATORY_CARE_PROVIDER_SITE_OTHER): Payer: Medicaid Other

## 2023-06-27 ENCOUNTER — Encounter: Payer: Self-pay | Admitting: Internal Medicine

## 2023-06-27 VITALS — BP 130/62 | HR 92 | Ht 72.0 in | Wt 348.0 lb

## 2023-06-27 DIAGNOSIS — K7682 Hepatic encephalopathy: Secondary | ICD-10-CM

## 2023-06-27 DIAGNOSIS — K766 Portal hypertension: Secondary | ICD-10-CM | POA: Diagnosis not present

## 2023-06-27 DIAGNOSIS — I8511 Secondary esophageal varices with bleeding: Secondary | ICD-10-CM

## 2023-06-27 DIAGNOSIS — K31819 Angiodysplasia of stomach and duodenum without bleeding: Secondary | ICD-10-CM

## 2023-06-27 DIAGNOSIS — K7031 Alcoholic cirrhosis of liver with ascites: Secondary | ICD-10-CM

## 2023-06-27 DIAGNOSIS — K3189 Other diseases of stomach and duodenum: Secondary | ICD-10-CM

## 2023-06-27 DIAGNOSIS — D500 Iron deficiency anemia secondary to blood loss (chronic): Secondary | ICD-10-CM

## 2023-06-27 DIAGNOSIS — R161 Splenomegaly, not elsewhere classified: Secondary | ICD-10-CM

## 2023-06-27 DIAGNOSIS — K7290 Hepatic failure, unspecified without coma: Secondary | ICD-10-CM

## 2023-06-27 LAB — PROTIME-INR
INR: 1.2 {ratio} — ABNORMAL HIGH (ref 0.8–1.0)
Prothrombin Time: 12.7 s (ref 9.6–13.1)

## 2023-06-27 LAB — FERRITIN: Ferritin: 43.6 ng/mL (ref 22.0–322.0)
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
## 2023-06-27 NOTE — Patient Instructions (Addendum)
 Your provider has requested that you go to the basement level for lab work before leaving today. Press B on the elevator. The lab is located at the first door on the left as you exit the elevator.  Due to recent changes in healthcare laws, you may see the results of your imaging and laboratory studies on MyChart before your provider has had a chance to review them.  We understand that in some cases there may be results that are confusing or concerning to you. Not all laboratory results come back in the same time frame and the provider may be waiting for multiple results in order to interpret others.  Please give us  48 hours in order for your provider to thoroughly review all the results before contacting the office for clarification of your results.   You have been scheduled for an abdominal ultrasound at _______________________ on __________ at _________________. Please arrive 30 minutes prior to your appointment for registration. Make certain not to have anything to eat or drink 6 hours prior to your appointment. Should you need to reschedule your appointment, please contact radiology at [phone]. This test typically takes about 30 minutes to perform.  I appreciate the opportunity to care for you. Lupita Commander, MD, [facility]

## 2023-06-29 LAB — AFP TUMOR MARKER: AFP-Tumor Marker: 2.4 ng/mL (ref <6.1)

## 2023-07-12 ENCOUNTER — Telehealth: Payer: Self-pay

## 2023-07-12 NOTE — Telephone Encounter (Signed)
Called patient to see if he has been scheduled with general surgery for HRA, no answer and mailbox full.   Sandie Ano, RN

## 2023-07-12 NOTE — Telephone Encounter (Signed)
Patient returned call, reports he's scheduled with [facility] next week.   Sandie Ano, RN

## 2023-07-13 ENCOUNTER — Ambulatory Visit: Payer: Self-pay | Admitting: General Surgery

## 2023-07-13 NOTE — H&P (Signed)
PROVIDER:  Elenora Gamma, MD  MRN: [id number] DOB: 1966/01/14 DATE OF ENCOUNTER: 07/13/2023  Subjective   Chief Complaint: New Consultation ( Pap smear of anus with ASCUS)     History of Present Illness: [name] is a 58 y.o. male who is seen today as an office consultation at the request of Dr. Daiva Eves for evaluation of ASCUS.    58 y.o. M with HIV, alcoholic liver cirrhosis, chronic back pain who presents to the office after having an anal pap in Dec 24 that showed ASCUS with pos HR HPV.   Review of Systems: A complete review of systems was obtained from the patient.  I have reviewed this information and discussed as appropriate with the patient.  See HPI as well for other ROS.    Medical History: Past Medical History:  Diagnosis Date   Anemia    Depression    HIV -AIDS with opportunistic infection, Symptomatic (CMS/HHS-HCC) 1992   Hyperlipidemia    Systemic hypertension     Patient Active Problem List  Diagnosis   Alcoholic cirrhosis of liver with ascites (CMS/HHS-HCC)   HIV (human immunodeficiency virus infection) (CMS/HHS-HCC)   Hepatic encephalopathy (CMS/HHS-HCC)   Alcohol use disorder, severe, dependence (CMS/HHS-HCC)   Altered mental status, unspecified   Ascites   Constipation   DOE (dyspnea on exertion)   Gastroesophageal reflux disease with esophagitis   Intentional benzodiazepine overdose (CMS/HHS-HCC)   MDD (major depressive disorder), recurrent severe, without psychosis (CMS/HHS-HCC)   Varices, esophageal (CMS/HHS-HCC)    Past Surgical History:  Procedure Laterality Date   COLONOSCOPY  12/24/11   internal hemorrhoids, otherwise normal colonoscopy   EGD  09/04/13   3 columns small varices in the distal and middle third of the esophagus     Allergies  Allergen Reactions   Penicillin G Rash   Penicillins Rash    REACTION: hives    Current Outpatient Medications on File Prior to Visit  Medication Sig Dispense Refill    ACETAMINOPHEN/DIPHENHYDRAMINE (TYLENOL PM ORAL) Take 1 tablet by mouth once daily as needed.     acyclovir (ZOVIRAX) 400 MG tablet Take 400 mg by mouth 2 (two) times daily as needed.      ALPRAZolam (XANAX) 1 MG tablet Take 1 mg by mouth 2 (two) times daily as needed.      AMILoride (MIDAMOR) 5 MG tablet Take 10 mg by mouth once daily.      amitriptyline (ELAVIL) 50 MG tablet Take 100 mg by mouth nightly.      atorvastatin (LIPITOR) 10 MG tablet Take 10 mg by mouth once daily     budesonide-formoteroL (SYMBICORT) 160-4.5 mcg/actuation inhaler Inhale 2 inhalations into the lungs 2 (two) times daily     calcium carbonate (TUMS) 200 mg calcium (500 mg) chewable tablet Take 1 tablet by mouth     cholecalciferol (VITAMIN D3) 1000 unit tablet Take 2,000 Units by mouth once daily     diclofenac (VOLTAREN) 1 % topical gel Apply 2 g topically     diclofenac (VOLTAREN) 75 MG EC tablet Take 75 mg by mouth     dolutegravir (TIVICAY) 50 mg tablet Take 50 mg by mouth once daily.      emtricitabine-tenofovir (TRUVADA) 200-300 mg tablet Take by mouth once daily.      esomeprazole (NEXIUM) 40 MG DR capsule Take by mouth once daily.      FUROsemide (LASIX) 40 MG tablet TAKE 1 TABLET BY MOUTH TWICE DAILY     lactulose (  ENULOSE) 10 gram/15 mL oral solution Take 30 cc ( ML) 3-4 times daily.     multivitamin with B complex-vitamin C (FARBEE WITH C) tablet Take 1 tablet by mouth.     nicotine polacrilex (NICORETTE) 4 MG gum Take 4 mg by mouth     peg 400-propylene glycol (SYSTANE, PROPYLENE GLYCOL,) 0.4-0.3 % drops Apply 2 drops to eye     peg 400-propylene glycol, PF, (SYSTANE ULTRA) 0.4-0.3 % ophthalmic drops Apply to eye.     polyethylene glycol (MIRALAX) powder Take 17 g by mouth at bedtime as needed     potassium chloride (K-DUR,KLOR-CON) 20 MEQ ER tablet Take 2 tablets (40 mEq total) by mouth once daily. (Patient taking differently: Take 40 mEq by mouth 2 (two) times daily) 60 tablet 10   PROPYLENE GLYCOL  (SYSTANE BALANCE OPHTH) Apply 1 drop to eye as needed.     rifaximin (XIFAXAN) 550 mg tablet TAKE 1 TABLET BY MOUTH TWICE DAILY     simvastatin (ZOCOR) 10 MG tablet Take 10 mg by mouth once daily     thiamine (VITAMIN B-1) 50 MG tablet Take 1 tablet by mouth once daily.      zinc 50 mg Tab Take 50 mg by mouth once daily.     No current facility-administered medications on file prior to visit.    History reviewed. No pertinent family history.   Social History   Tobacco Use  Smoking Status Former   Current packs/day: 0.50   Average packs/day: 0.5 packs/day for 20.0 years (10.0 ttl pk-yrs)   Types: Cigarettes  Smokeless Tobacco Never     Social History   Socioeconomic History   Marital status: Unknown  Tobacco Use   Smoking status: Former    Current packs/day: 0.50    Average packs/day: 0.5 packs/day for 20.0 years (10.0 ttl pk-yrs)    Types: Cigarettes   Smokeless tobacco: Never  Substance and Sexual Activity   Alcohol use: No    Alcohol/week: 0.0 standard drinks of alcohol    Comment: previous alcohol abuse, quit 5 yrs ago, active in AA    Objective:    Vitals:   07/13/23 0915  BP: (!) 150/90  Pulse: (!) 112  Temp: 37.1 C (98.8 F)  SpO2: 92%  Weight: (!) 157.9 kg (348 lb 3.2 oz)  Height: 180.3 cm (5\' 11" )  PainSc: 0-No pain     Exam Gen: NAD CV: RRR Pulm: CTA Abd: soft  Labs, Imaging and Diagnostic Testing: HIV ND CD4 260  Assessment and Plan:  Diagnoses and all orders for this visit:  Pap smear of anus with ASCUS    58 y.o. M with HIV who presents to the office after anal Pap test showed ASCUS.  I have recommended HRA with possible biopsy and ablation.  Risks include bleeding, pain and recurrence.  All questions were answered.  Pt agrees to proceed.    Vanita Panda, MD Colon and Rectal Surgery [facility]

## 2023-07-18 ENCOUNTER — Ambulatory Visit (HOSPITAL_COMMUNITY): Admission: RE | Admit: 2023-07-18 | Payer: Medicaid Other | Source: Ambulatory Visit

## 2023-08-23 ENCOUNTER — Other Ambulatory Visit: Payer: Self-pay

## 2023-08-23 ENCOUNTER — Encounter (HOSPITAL_BASED_OUTPATIENT_CLINIC_OR_DEPARTMENT_OTHER): Payer: Self-pay | Admitting: General Surgery

## 2023-08-24 ENCOUNTER — Encounter (HOSPITAL_BASED_OUTPATIENT_CLINIC_OR_DEPARTMENT_OTHER)
Admission: RE | Admit: 2023-08-24 | Discharge: 2023-08-24 | Disposition: A | Discharge status: Home / Self Care | Source: Ambulatory Visit | Attending: General Surgery | Admitting: General Surgery

## 2023-08-24 DIAGNOSIS — K7682 Hepatic encephalopathy: Secondary | ICD-10-CM | POA: Insufficient documentation

## 2023-08-24 DIAGNOSIS — Z01818 Encounter for other preprocedural examination: Secondary | ICD-10-CM | POA: Diagnosis not present

## 2023-08-24 DIAGNOSIS — Z01812 Encounter for preprocedural laboratory examination: Secondary | ICD-10-CM | POA: Diagnosis present

## 2023-08-24 DIAGNOSIS — K7031 Alcoholic cirrhosis of liver with ascites: Secondary | ICD-10-CM | POA: Insufficient documentation

## 2023-08-24 DIAGNOSIS — Z01810 Encounter for preprocedural cardiovascular examination: Secondary | ICD-10-CM | POA: Diagnosis present

## 2023-08-24 LAB — COMPREHENSIVE METABOLIC PANEL
ALT: 25 U/L (ref 0–44)
AST: 35 U/L (ref 15–41)
Albumin: 3.7 g/dL (ref 3.5–5.0)
Alkaline Phosphatase: 90 U/L (ref 38–126)
Anion gap: 13 (ref 5–15)
BUN: 13 mg/dL (ref 6–20)
CO2: 23 mmol/L (ref 22–32)
Calcium: 9.3 mg/dL (ref 8.9–10.3)
Chloride: 103 mmol/L (ref 98–111)
Creatinine, Ser: 1.04 mg/dL (ref 0.61–1.24)
GFR, Estimated: >60 mL/min (ref >60)
Glucose, Bld: 88 mg/dL (ref 70–99)
Potassium: 4.3 mmol/L (ref 3.5–5.1)
Sodium: 139 mmol/L (ref 135–145)
Total Bilirubin: 1.8 mg/dL — ABNORMAL HIGH (ref 0.0–1.2)
Total Protein: 6.3 g/dL — ABNORMAL LOW (ref 6.5–8.1)

## 2023-08-26 ENCOUNTER — Ambulatory Visit (HOSPITAL_BASED_OUTPATIENT_CLINIC_OR_DEPARTMENT_OTHER): Payer: Self-pay | Admitting: Anesthesiology

## 2023-08-26 ENCOUNTER — Encounter (HOSPITAL_BASED_OUTPATIENT_CLINIC_OR_DEPARTMENT_OTHER)
Admission: RE | Disposition: A | Discharge status: Home / Self Care | Payer: Self-pay | Source: Home / Self Care | Attending: General Surgery

## 2023-08-26 ENCOUNTER — Other Ambulatory Visit: Payer: Self-pay

## 2023-08-26 ENCOUNTER — Ambulatory Visit (HOSPITAL_BASED_OUTPATIENT_CLINIC_OR_DEPARTMENT_OTHER)
Admission: RE | Admit: 2023-08-26 | Discharge: 2023-08-26 | Disposition: A | Discharge status: Home / Self Care | Payer: Medicaid Other | Attending: General Surgery | Admitting: General Surgery

## 2023-08-26 ENCOUNTER — Encounter (HOSPITAL_BASED_OUTPATIENT_CLINIC_OR_DEPARTMENT_OTHER): Payer: Self-pay | Admitting: General Surgery

## 2023-08-26 DIAGNOSIS — F32A Depression, unspecified: Secondary | ICD-10-CM | POA: Insufficient documentation

## 2023-08-26 DIAGNOSIS — D649 Anemia, unspecified: Secondary | ICD-10-CM | POA: Diagnosis not present

## 2023-08-26 DIAGNOSIS — Z7951 Long term (current) use of inhaled steroids: Secondary | ICD-10-CM | POA: Diagnosis not present

## 2023-08-26 DIAGNOSIS — J45909 Unspecified asthma, uncomplicated: Secondary | ICD-10-CM | POA: Insufficient documentation

## 2023-08-26 DIAGNOSIS — I10 Essential (primary) hypertension: Secondary | ICD-10-CM

## 2023-08-26 DIAGNOSIS — Z6841 Body Mass Index (BMI) 40.0 and over, adult: Secondary | ICD-10-CM | POA: Insufficient documentation

## 2023-08-26 DIAGNOSIS — I8510 Secondary esophageal varices without bleeding: Secondary | ICD-10-CM | POA: Insufficient documentation

## 2023-08-26 DIAGNOSIS — R0609 Other forms of dyspnea: Secondary | ICD-10-CM | POA: Insufficient documentation

## 2023-08-26 DIAGNOSIS — Z87891 Personal history of nicotine dependence: Secondary | ICD-10-CM | POA: Diagnosis not present

## 2023-08-26 DIAGNOSIS — K7031 Alcoholic cirrhosis of liver with ascites: Secondary | ICD-10-CM | POA: Diagnosis not present

## 2023-08-26 DIAGNOSIS — K219 Gastro-esophageal reflux disease without esophagitis: Secondary | ICD-10-CM | POA: Insufficient documentation

## 2023-08-26 DIAGNOSIS — Z21 Asymptomatic human immunodeficiency virus [HIV] infection status: Secondary | ICD-10-CM | POA: Insufficient documentation

## 2023-08-26 DIAGNOSIS — K641 Second degree hemorrhoids: Secondary | ICD-10-CM | POA: Diagnosis not present

## 2023-08-26 DIAGNOSIS — R85610 Atypical squamous cells of undetermined significance on cytologic smear of anus (ASC-US): Secondary | ICD-10-CM

## 2023-08-26 DIAGNOSIS — E66813 Obesity, class 3: Secondary | ICD-10-CM | POA: Insufficient documentation

## 2023-08-26 DIAGNOSIS — K7682 Hepatic encephalopathy: Secondary | ICD-10-CM

## 2023-08-26 DIAGNOSIS — F419 Anxiety disorder, unspecified: Secondary | ICD-10-CM | POA: Diagnosis not present

## 2023-08-26 DIAGNOSIS — Z79624 Long term (current) use of inhibitors of nucleotide synthesis: Secondary | ICD-10-CM | POA: Diagnosis not present

## 2023-08-26 DIAGNOSIS — Z79899 Other long term (current) drug therapy: Secondary | ICD-10-CM | POA: Diagnosis not present

## 2023-08-26 DIAGNOSIS — K6282 Dysplasia of anus: Secondary | ICD-10-CM | POA: Diagnosis present

## 2023-08-26 HISTORY — PX: RECTAL BIOPSY: SHX2303

## 2023-08-26 HISTORY — PX: HIGH RESOLUTION ANOSCOPY: SHX6345

## 2023-08-26 SURGERY — ANOSCOPY, HIGH RESOLUTION
Anesthesia: Monitor Anesthesia Care | Site: Rectum

## 2023-08-26 MED ORDER — BUPIVACAINE-EPINEPHRINE 0.5% -1:200000 IJ SOLN
INTRAMUSCULAR | Status: DC | PRN
  Administered 2023-08-26: 30 mL

## 2023-08-26 MED ORDER — FENTANYL CITRATE (PF) 100 MCG/2ML IJ SOLN: 25.0000 ug | INTRAMUSCULAR | Status: DC | PRN

## 2023-08-26 MED ORDER — ONDANSETRON HCL 4 MG/2ML IJ SOLN
INTRAMUSCULAR | Status: DC | PRN
  Administered 2023-08-26: 4 mg via INTRAVENOUS

## 2023-08-26 MED ORDER — FENTANYL CITRATE (PF) 100 MCG/2ML IJ SOLN
INTRAMUSCULAR | Status: DC | PRN
  Administered 2023-08-26 (×2): 25 ug via INTRAVENOUS

## 2023-08-26 MED ORDER — PROPOFOL 500 MG/50ML IV EMUL
INTRAVENOUS | Status: DC | PRN
  Administered 2023-08-26 (×2): 30 ug via INTRAVENOUS
  Administered 2023-08-26: 100 ug/kg/min via INTRAVENOUS

## 2023-08-26 MED ORDER — LIDOCAINE 2% (20 MG/ML) 5 ML SYRINGE
INTRAMUSCULAR | Status: DC | PRN
  Administered 2023-08-26: 60 mg via INTRAVENOUS

## 2023-08-26 MED ORDER — ACETAMINOPHEN 500 MG PO TABS
1000.0000 mg | ORAL_TABLET | Freq: Once | ORAL | Status: AC
  Administered 2023-08-26: 1000 mg via ORAL

## 2023-08-26 MED ORDER — LIDOCAINE 2% (20 MG/ML) 5 ML SYRINGE
INTRAMUSCULAR | Status: AC
  Filled 2023-08-26: qty 5

## 2023-08-26 MED ORDER — ACETAMINOPHEN 500 MG PO TABS
ORAL_TABLET | ORAL | Status: AC
  Filled 2023-08-26: qty 2

## 2023-08-26 MED ORDER — ONDANSETRON HCL 4 MG/2ML IJ SOLN
INTRAMUSCULAR | Status: AC
  Filled 2023-08-26: qty 2

## 2023-08-26 MED ORDER — LACTATED RINGERS IV SOLN: INTRAVENOUS | Status: DC

## 2023-08-26 MED ORDER — PROPOFOL 10 MG/ML IV BOLUS
INTRAVENOUS | Status: AC
  Filled 2023-08-26: qty 20

## 2023-08-26 MED ORDER — MIDAZOLAM HCL 2 MG/2ML IJ SOLN
INTRAMUSCULAR | Status: AC
  Filled 2023-08-26: qty 2

## 2023-08-26 MED ORDER — STERILE WATER FOR IRRIGATION IR SOLN
Status: DC | PRN
  Administered 2023-08-26: 1000 mL

## 2023-08-26 MED ORDER — SODIUM CHLORIDE 0.9% FLUSH: 3.0000 mL | Freq: Two times a day (BID) | INTRAVENOUS | Status: DC

## 2023-08-26 MED ORDER — ACETIC ACID 5 % SOLN
Status: DC | PRN
  Administered 2023-08-26: 1 via TOPICAL

## 2023-08-26 MED ORDER — FENTANYL CITRATE (PF) 100 MCG/2ML IJ SOLN
INTRAMUSCULAR | Status: AC
  Filled 2023-08-26: qty 2

## 2023-08-26 MED ORDER — MIDAZOLAM HCL 2 MG/2ML IJ SOLN
INTRAMUSCULAR | Status: DC | PRN
  Administered 2023-08-26: 2 mg via INTRAVENOUS

## 2023-08-26 SURGICAL SUPPLY — 34 items
BRIEF MESH DISP 2XL (UNDERPADS AND DIAPERS) ×1 IMPLANT
COVER BACK TABLE 60X90IN (DRAPES) ×1 IMPLANT
DRAPE HYSTEROSCOPY (MISCELLANEOUS) IMPLANT
ELECT REM PT RETURN 9FT ADLT (ELECTROSURGICAL) ×1 IMPLANT
ELECTRODE REM PT RTRN 9FT ADLT (ELECTROSURGICAL) ×1 IMPLANT
GAUZE 4X4 16PLY ~~LOC~~+RFID DBL (SPONGE) ×1 IMPLANT
GAUZE PAD ABD 8X10 STRL (GAUZE/BANDAGES/DRESSINGS) ×1 IMPLANT
GAUZE SPONGE 4X4 12PLY STRL (GAUZE/BANDAGES/DRESSINGS) IMPLANT
GLOVE BIO SURGEON STRL SZ 6.5 (GLOVE) ×1 IMPLANT
GLOVE BIOGEL PI IND STRL 7.5 (GLOVE) IMPLANT
GLOVE INDICATOR 6.5 STRL GRN (GLOVE) ×1 IMPLANT
GLOVE SURG SS PI 7.0 STRL IVOR (GLOVE) IMPLANT
GOWN STRL REUS W/TWL LRG LVL3 (GOWN DISPOSABLE) IMPLANT
GOWN STRL REUS W/TWL XL LVL3 (GOWN DISPOSABLE) ×1 IMPLANT
HIBICLENS CHG 4% 4OZ BTL (MISCELLANEOUS) IMPLANT
KIT TURNOVER KIT B (KITS) ×1 IMPLANT
NDL HYPO 22X1.5 SAFETY MO (MISCELLANEOUS) ×1 IMPLANT
NEEDLE HYPO 22X1.5 SAFETY MO (MISCELLANEOUS) ×1 IMPLANT
PACK BASIN DAY SURGERY FS (CUSTOM PROCEDURE TRAY) ×1 IMPLANT
PENCIL SMOKE EVACUATOR (MISCELLANEOUS) ×1 IMPLANT
SHEET MEDIUM DRAPE 40X70 STRL (DRAPES) IMPLANT
SLEEVE SCD COMPRESS KNEE MED (STOCKING) ×1 IMPLANT
SPIKE FLUID TRANSFER (MISCELLANEOUS) ×1 IMPLANT
SPONGE HEMORRHOID 8X3CM (HEMOSTASIS) IMPLANT
SPONGE SURGIFOAM ABS GEL 12-7 (HEMOSTASIS) IMPLANT
SUCTION TUBE FRAZIER 10FR DISP (SUCTIONS) IMPLANT
SUT CHROMIC 2 0 SH (SUTURE) IMPLANT
SYR BULB IRRIG 60ML STRL (SYRINGE) ×1 IMPLANT
SYR CONTROL 10ML LL (SYRINGE) ×1 IMPLANT
TOWEL GREEN STERILE FF (TOWEL DISPOSABLE) ×1 IMPLANT
TRAY DSU PREP LF (CUSTOM PROCEDURE TRAY) ×1 IMPLANT
TUBE CONNECTING 20X1/4 (TUBING) ×1 IMPLANT
WATER STERILE IRR 1000ML POUR (IV SOLUTION) IMPLANT
YANKAUER SUCT BULB TIP NO VENT (SUCTIONS) ×1 IMPLANT

## 2023-08-26 NOTE — Anesthesia Preprocedure Evaluation (Addendum)
 Anesthesia Evaluation  Patient identified by MRN, date of birth, ID band Patient awake    Reviewed: Allergy & Precautions, H&P , NPO status , Patient's Chart, lab work & pertinent test results  Airway Mallampati: II  TM Distance: >3 FB Neck ROM: Full    Dental no notable dental hx. (+) Teeth Intact, Dental Advisory Given   Pulmonary asthma , former smoker   Pulmonary exam normal breath sounds clear to auscultation       Cardiovascular hypertension, Pt. on medications + DOE   Rhythm:Regular Rate:Normal     Neuro/Psych   Anxiety Depression    negative neurological ROS     GI/Hepatic ,GERD  Medicated,,(+) Cirrhosis   Esophageal Varices  substance abuse  alcohol use  Endo/Other    Class 3 obesity  Renal/GU negative Renal ROS  negative genitourinary   Musculoskeletal   Abdominal   Peds  Hematology  (+) Blood dyscrasia, anemia , HIV  Anesthesia Other Findings   Reproductive/Obstetrics negative OB ROS                             Anesthesia Physical Anesthesia Plan  ASA: 3  Anesthesia Plan: MAC   Post-op Pain Management: Tylenol PO (pre-op)*   Induction: Intravenous  PONV Risk Score and Plan: 2 and Ondansetron, Propofol infusion and Midazolam  Airway Management Planned: Natural Airway and Simple Face Mask  Additional Equipment:   Intra-op Plan:   Post-operative Plan:   Informed Consent: I have reviewed the patients History and Physical, chart, labs and discussed the procedure including the risks, benefits and alternatives for the proposed anesthesia with the patient or authorized representative who has indicated his/her understanding and acceptance.     Dental advisory given  Plan Discussed with: CRNA  Anesthesia Plan Comments:        Anesthesia Quick Evaluation

## 2023-08-26 NOTE — H&P (Signed)
 PROVIDER:  Elenora Gamma, MD   MRN: [id number] DOB: August 27, 1965    Subjective    Chief Complaint: New Consultation ( Pap smear of anus with ASCUS)       History of Present Illness: [name] is a 58 y.o. male who is seen today as an office consultation at the request of Dr. Daiva Eves for evaluation of ASCUS.     58 y.o. M with HIV, alcoholic liver cirrhosis, chronic back pain who presents to the office after having an anal pap in Dec 24 that showed ASCUS with pos HR HPV.     Review of Systems: A complete review of systems was obtained from the patient.  I have reviewed this information and discussed as appropriate with the patient.  See HPI as well for other ROS.       Medical History:     Past Medical History:  Diagnosis Date   Anemia     Depression     HIV -AIDS with opportunistic infection, Symptomatic (CMS/HHS-HCC) 1992   Hyperlipidemia     Systemic hypertension           Patient Active Problem List  Diagnosis   Alcoholic cirrhosis of liver with ascites (CMS/HHS-HCC)   HIV (human immunodeficiency virus infection) (CMS/HHS-HCC)   Hepatic encephalopathy (CMS/HHS-HCC)   Alcohol use disorder, severe, dependence (CMS/HHS-HCC)   Altered mental status, unspecified   Ascites   Constipation   DOE (dyspnea on exertion)   Gastroesophageal reflux disease with esophagitis   Intentional benzodiazepine overdose (CMS/HHS-HCC)   MDD (major depressive disorder), recurrent severe, without psychosis (CMS/HHS-HCC)   Varices, esophageal (CMS/HHS-HCC)           Past Surgical History:  Procedure Laterality Date   COLONOSCOPY   12/24/11    internal hemorrhoids, otherwise normal colonoscopy   EGD   09/04/13    3 columns small varices in the distal and middle third of the esophagus           Allergies  Allergen Reactions   Penicillin G Rash   Penicillins Rash      REACTION: hives            Current Outpatient Medications on File Prior to Visit  Medication Sig  Dispense Refill   ACETAMINOPHEN/DIPHENHYDRAMINE (TYLENOL PM ORAL) Take 1 tablet by mouth once daily as needed.       acyclovir (ZOVIRAX) 400 MG tablet Take 400 mg by mouth 2 (two) times daily as needed.        ALPRAZolam (XANAX) 1 MG tablet Take 1 mg by mouth 2 (two) times daily as needed.        AMILoride (MIDAMOR) 5 MG tablet Take 10 mg by mouth once daily.        amitriptyline (ELAVIL) 50 MG tablet Take 100 mg by mouth nightly.        atorvastatin (LIPITOR) 10 MG tablet Take 10 mg by mouth once daily       budesonide-formoteroL (SYMBICORT) 160-4.5 mcg/actuation inhaler Inhale 2 inhalations into the lungs 2 (two) times daily       calcium carbonate (TUMS) 200 mg calcium (500 mg) chewable tablet Take 1 tablet by mouth       cholecalciferol (VITAMIN D3) 1000 unit tablet Take 2,000 Units by mouth once daily       diclofenac (VOLTAREN) 1 % topical gel Apply 2 g topically       diclofenac (VOLTAREN) 75 MG EC tablet Take 75 mg by mouth  dolutegravir (TIVICAY) 50 mg tablet Take 50 mg by mouth once daily.        emtricitabine-tenofovir (TRUVADA) 200-300 mg tablet Take by mouth once daily.        esomeprazole (NEXIUM) 40 MG DR capsule Take by mouth once daily.        FUROsemide (LASIX) 40 MG tablet TAKE 1 TABLET BY MOUTH TWICE DAILY       lactulose (ENULOSE) 10 gram/15 mL oral solution Take 30 cc ( ML) 3-4 times daily.       multivitamin with B complex-vitamin C (FARBEE WITH C) tablet Take 1 tablet by mouth.       nicotine polacrilex (NICORETTE) 4 MG gum Take 4 mg by mouth       peg 400-propylene glycol (SYSTANE, PROPYLENE GLYCOL,) 0.4-0.3 % drops Apply 2 drops to eye       peg 400-propylene glycol, PF, (SYSTANE ULTRA) 0.4-0.3 % ophthalmic drops Apply to eye.       polyethylene glycol (MIRALAX) powder Take 17 g by mouth at bedtime as needed       potassium chloride (K-DUR,KLOR-CON) 20 MEQ ER tablet Take 2 tablets (40 mEq total) by mouth once daily. (Patient taking differently: Take 40 mEq by  mouth 2 (two) times daily) 60 tablet 10   PROPYLENE GLYCOL (SYSTANE BALANCE OPHTH) Apply 1 drop to eye as needed.       rifaximin (XIFAXAN) 550 mg tablet TAKE 1 TABLET BY MOUTH TWICE DAILY       simvastatin (ZOCOR) 10 MG tablet Take 10 mg by mouth once daily       thiamine (VITAMIN B-1) 50 MG tablet Take 1 tablet by mouth once daily.        zinc 50 mg Tab Take 50 mg by mouth once daily.        No current facility-administered medications on file prior to visit.      History reviewed. No pertinent family history.    Social History        Tobacco Use  Smoking Status Former   Current packs/day: 0.50   Average packs/day: 0.5 packs/day for 20.0 years (10.0 ttl pk-yrs)   Types: Cigarettes  Smokeless Tobacco Never      Social History         Socioeconomic History   Marital status: Unknown  Tobacco Use   Smoking status: Former      Current packs/day: 0.50      Average packs/day: 0.5 packs/day for 20.0 years (10.0 ttl pk-yrs)      Types: Cigarettes   Smokeless tobacco: Never  Substance and Sexual Activity   Alcohol use: No      Alcohol/week: 0.0 standard drinks of alcohol      Comment: previous alcohol abuse, quit 5 yrs ago, active in AA      Objective:    Vitals:   08/26/23 0732  BP: (!) 143/80  Pulse: 82  Resp: 20  Temp: 97.9 F (36.6 C)  SpO2: 97%       Exam Gen: NAD CV: RRR Pulm: CTA Abd: soft   Labs, Imaging and Diagnostic Testing: HIV ND CD4 260   Assessment and Plan:  Diagnoses and all orders for this visit:   Pap smear of anus with ASCUS     58 y.o. M with HIV who presents to the office after anal Pap test showed ASCUS.  I have recommended HRA with possible biopsy and ablation.  Risks include bleeding, pain and recurrence.  All questions  were answered.  Pt agrees to proceed.     Vanita Panda, MD Colon and Rectal Surgery [facility]

## 2023-08-26 NOTE — Transfer of Care (Signed)
 Immediate Anesthesia Transfer of Care Note  Patient: [name]  Procedure(s) Performed: ANOSCOPY, HIGH RESOLUTION (Rectum) BIOPSY AND ABLATION (Rectum)  Patient Location: PACU  Anesthesia Type:MAC  Level of Consciousness: drowsy  Airway & Oxygen Therapy: Patient Spontanous Breathing and Patient connected to face mask oxygen  Post-op Assessment: Report given to RN and Post -op Vital signs reviewed and stable  Post vital signs: Reviewed and stable  Last Vitals:  Vitals Value Taken Time  BP 123/65 08/26/23 0913  Temp    Pulse 86 08/26/23 0914  Resp 13 08/26/23 0914  SpO2 97 % 08/26/23 0914  Vitals shown include unfiled device data.  Last Pain:  Vitals:   08/26/23 0743  TempSrc:   PainSc: 8       Patients Stated Pain Goal: 5 (08/26/23 0732)  Complications: No notable events documented.

## 2023-08-26 NOTE — Discharge Instructions (Addendum)
 Beginning the day after surgery:  You may sit in a tub of warm water 2-3 times a day to relieve discomfort.  Eat a regular diet high in fiber.  Avoid foods that give you constipation or diarrhea.  Avoid foods that are difficult to digest, such as seeds, nuts, corn or popcorn.  Do not go any longer than 2 days without a bowel movement.  You may take a dose of Milk of Magnesia if you become constipated.    Drink 6-8 glasses of water daily.  Walking is encouraged.  Avoid strenuous activity and heavy lifting for one month after surgery.    Call the office if you have any questions or concerns.  Call immediately if you develop:  Excessive rectal bleeding (more than a cup or passing large clots) Increased discomfort Fever greater than 100 F Difficulty urinating   Post Anesthesia Home Care Instructions  Activity: Get plenty of rest for the remainder of the day. A responsible individual must stay with you for 24 hours following the procedure.  For the next 24 hours, DO NOT: -Drive a car -Advertising copywriter -Drink alcoholic beverages -Take any medication unless instructed by your physician -Make any legal decisions or sign important papers.  Meals: Start with liquid foods such as gelatin or soup. Progress to regular foods as tolerated. Avoid greasy, spicy, heavy foods. If nausea and/or vomiting occur, drink only clear liquids until the nausea and/or vomiting subsides. Call your physician if vomiting continues.  Special Instructions/Symptoms: Your throat may feel dry or sore from the anesthesia or the breathing tube placed in your throat during surgery. If this causes discomfort, gargle with warm salt water. The discomfort should disappear within 24 hours.  If you had a scopolamine patch placed behind your ear for the management of post- operative nausea and/or vomiting:  1. The medication in the patch is effective for 72 hours, after which it should be removed.  Wrap patch in a tissue and  discard in the trash. Wash hands thoroughly with soap and water. 2. You may remove the patch earlier than 72 hours if you experience unpleasant side effects which may include dry mouth, dizziness or visual disturbances. 3. Avoid touching the patch. Wash your hands with soap and water after contact with the patch.    Post Anesthesia Home Care Instructions  Activity: Get plenty of rest for the remainder of the day. A responsible individual must stay with you for 24 hours following the procedure.  For the next 24 hours, DO NOT: -Drive a car -Advertising copywriter -Drink alcoholic beverages -Take any medication unless instructed by your physician -Make any legal decisions or sign important papers.  Meals: Start with liquid foods such as gelatin or soup. Progress to regular foods as tolerated. Avoid greasy, spicy, heavy foods. If nausea and/or vomiting occur, drink only clear liquids until the nausea and/or vomiting subsides. Call your physician if vomiting continues.  Special Instructions/Symptoms: Your throat may feel dry or sore from the anesthesia or the breathing tube placed in your throat during surgery. If this causes discomfort, gargle with warm salt water. The discomfort should disappear within 24 hours.  If you had a scopolamine patch placed behind your ear for the management of post- operative nausea and/or vomiting:  1. The medication in the patch is effective for 72 hours, after which it should be removed.  Wrap patch in a tissue and discard in the trash. Wash hands thoroughly with soap and water. 2. You may remove the patch  earlier than 72 hours if you experience unpleasant side effects which may include dry mouth, dizziness or visual disturbances. 3. Avoid touching the patch. Wash your hands with soap and water after contact with the patch.      No tylenol until 1:45

## 2023-08-26 NOTE — Anesthesia Postprocedure Evaluation (Signed)
 Anesthesia Post Note  Patient: MAYSON MCNEISH  Procedure(s) Performed: ANOSCOPY, HIGH RESOLUTION (Rectum) BIOPSY AND ABLATION (Rectum)     Patient location during evaluation: PACU Anesthesia Type: MAC Level of consciousness: awake and alert Pain management: pain level controlled Vital Signs Assessment: post-procedure vital signs reviewed and stable Respiratory status: spontaneous breathing, nonlabored ventilation and respiratory function stable Cardiovascular status: stable and blood pressure returned to baseline Postop Assessment: no apparent nausea or vomiting Anesthetic complications: no  No notable events documented.  Last Vitals:  Vitals:   08/26/23 0945 08/26/23 1000  BP: 134/73 (!) 149/88  Pulse: 77 78  Resp: 14 16  Temp:  36.8 C  SpO2: 97% 98%    Last Pain:  Vitals:   08/26/23 1000  TempSrc:   PainSc: 0-No pain                 Georgie Haque,W. EDMOND

## 2023-08-26 NOTE — Op Note (Signed)
 08/26/2023  9:02 AM  PATIENT:  [name]  58 y.o. male  Patient Care Team: Pcp, No as PCP - General Iva Boop, MD as Consulting Physician (Gastroenterology) Daiva Eves, Lisette Grinder, MD as Consulting Physician (Infectious Diseases) Earle Gell, MD as Referring Physician (Plastic Surgery)  PRE-OPERATIVE DIAGNOSIS:  PAP SMEAR OF ANUS WITH ATYPICAL SQUAMOUS CELLS OF UNDETERMINED SIGNIFICANCE  POST-OPERATIVE DIAGNOSIS:  PAP SMEAR OF ANUS WITH ATYPICAL SQUAMOUS CELLS OF UNDETERMINED SIGNIFICANCE  PROCEDURE:  ANOSCOPY, HIGH RESOLUTION BIOPSY AND ABLATION   Surgeon(s): Romie Levee, MD  ASSISTANT: none   ANESTHESIA:   local and MAC  EBL: 10ml  Total I/O In: -  Out: 10 [Blood:10]  SPECIMEN:  Source of Specimen:  R posterior anal canal  DISPOSITION OF SPECIMEN:  PATHOLOGY  COUNTS:  YES  PLAN OF CARE: Discharge to home after PACU  PATIENT DISPOSITION:  PACU - hemodynamically stable.  INDICATION: 58 y.o. M with HIV and ASCUS on recent anal Pap test  OR FINDINGS: Large grade 2 hemorrhoids, indiscrete lesion R posterior hemorrhoid  DESCRIPTION: The patient was identified in the preoperative holding area and taken to the OR where they were laid supine on the operating room table in lithotomy position. MAC anesthesia was smoothly induced.  The patient was then prepped and draped in the usual sterile fashion. A surgical timeout was performed indicating the correct patient, procedure, positioning and preoperative antibioitics. SCDs were noted to be in place and functioning prior to the operation.   After this was completed, a sponge was soaked in 5% acetic acid was placed over the perianal region. This was allowed to soak for 2 minutes. The sponge was removed and the perianal region was evaluated with a colposcope.  There were some diffuse punctate acetowhite lesions circumferentially around the anal canal.  The internal anal canal was evaluated via anoscopy with a  Hill-Ferguson anoscope.  There was one area indiscrete area of acetowhite staining over the R posterior hemorrhoid.  A biopsy was taken and the area was ablated.  No discrete lesions were noted elsewhere.  Visualization was difficult due to his large hemorrhoids.  After this was completed, hemostasis was achieved with electrocautery and the biopsy site was closed using a 2-0 chromic suture.   A sterile dressing was applied over this. The patient was then awakened from anesthesia and sent to the postanesthesia care unit in stable condition. All counts were correct operating room staff.   Vanita Panda, MD  Colorectal and General Surgery [facility]

## 2023-08-27 ENCOUNTER — Encounter (HOSPITAL_BASED_OUTPATIENT_CLINIC_OR_DEPARTMENT_OTHER): Payer: Self-pay | Admitting: General Surgery

## 2023-08-30 LAB — SURGICAL PATHOLOGY

## 2023-10-03 ENCOUNTER — Ambulatory Visit (INDEPENDENT_AMBULATORY_CARE_PROVIDER_SITE_OTHER): Payer: Medicaid Other | Admitting: Infectious Disease

## 2023-10-03 ENCOUNTER — Other Ambulatory Visit: Payer: Self-pay

## 2023-10-03 ENCOUNTER — Encounter: Payer: Self-pay | Admitting: Infectious Disease

## 2023-10-03 ENCOUNTER — Telehealth: Payer: Self-pay

## 2023-10-03 VITALS — BP 135/84 | HR 89 | Resp 16 | Ht 71.0 in | Wt 340.0 lb

## 2023-10-03 DIAGNOSIS — R85612 Low grade squamous intraepithelial lesion on cytologic smear of anus (LGSIL): Secondary | ICD-10-CM | POA: Diagnosis not present

## 2023-10-03 DIAGNOSIS — I10 Essential (primary) hypertension: Secondary | ICD-10-CM | POA: Diagnosis not present

## 2023-10-03 DIAGNOSIS — K7030 Alcoholic cirrhosis of liver without ascites: Secondary | ICD-10-CM

## 2023-10-03 DIAGNOSIS — F331 Major depressive disorder, recurrent, moderate: Secondary | ICD-10-CM

## 2023-10-03 DIAGNOSIS — F4522 Body dysmorphic disorder: Secondary | ICD-10-CM

## 2023-10-03 DIAGNOSIS — E785 Hyperlipidemia, unspecified: Secondary | ICD-10-CM | POA: Diagnosis not present

## 2023-10-03 DIAGNOSIS — Z113 Encounter for screening for infections with a predominantly sexual mode of transmission: Secondary | ICD-10-CM

## 2023-10-03 DIAGNOSIS — B20 Human immunodeficiency virus [HIV] disease: Secondary | ICD-10-CM

## 2023-10-03 MED ORDER — FLUCONAZOLE 100 MG PO TABS
100.0000 mg | ORAL_TABLET | Freq: Every day | ORAL | 0 refills | Status: AC
Start: 2023-10-03 — End: 2023-10-17
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
## 2023-10-03 NOTE — Telephone Encounter (Signed)
 Patient advised fluconazole  has been sent in for him.

## 2023-10-03 NOTE — Telephone Encounter (Signed)
 Patient is calling stating he forgot to mention that he feels he has thrush on his tongue. He states has white coating on his tongue and pain when eating unless the food is soft.  Please advise. Nakeshia Waldeck Adel Holt, CMA

## 2023-10-04 LAB — C. TRACHOMATIS/N. GONORRHOEAE RNA
C. trachomatis RNA, TMA: NOT DETECTED
N. gonorrhoeae RNA, TMA: NOT DETECTED

## 2023-10-06 LAB — CBC WITH DIFFERENTIAL/PLATELET
Absolute Lymphocytes: 742 {cells}/uL — ABNORMAL LOW (ref 850–3900)
Absolute Monocytes: 590 {cells}/uL (ref 200–950)
Basophils Absolute: 29 {cells}/uL (ref 0–200)
Basophils Relative: 0.4 %
Eosinophils Absolute: 58 {cells}/uL (ref 15–500)
Eosinophils Relative: 0.8 %
HCT: 50.3 % — ABNORMAL HIGH (ref 38.5–50.0)
Hemoglobin: 17.4 g/dL — ABNORMAL HIGH (ref 13.2–17.1)
MCH: 33.1 pg — ABNORMAL HIGH (ref 27.0–33.0)
MCHC: 34.6 g/dL (ref 32.0–36.0)
MCV: 95.8 fL (ref 80.0–100.0)
MPV: 11.1 fL (ref 7.5–12.5)
Monocytes Relative: 8.2 %
Neutro Abs: 5782 {cells}/uL (ref 1500–7800)
Neutrophils Relative %: 80.3 %
Platelets: 80 10*3/uL — ABNORMAL LOW (ref 140–400)
RBC: 5.25 10*6/uL (ref 4.20–5.80)
RDW: 13.8 % (ref 11.0–15.0)
Total Lymphocyte: 10.3 %
WBC: 7.2 10*3/uL (ref 3.8–10.8)

## 2023-10-06 LAB — T-HELPER CELLS (CD4) COUNT (NOT AT ARMC)
Absolute CD4: 221 {cells}/uL — ABNORMAL LOW (ref 490–1740)
CD4 T Helper %: 26 % — ABNORMAL LOW (ref 30–61)
Total lymphocyte count: 850 {cells}/uL (ref 850–3900)

## 2023-10-06 LAB — COMPLETE METABOLIC PANEL WITHOUT GFR
AG Ratio: 1.8 (calc) (ref 1.0–2.5)
ALT: 33 U/L (ref 9–46)
AST: 30 U/L (ref 10–35)
Albumin: 4 g/dL (ref 3.6–5.1)
Alkaline phosphatase (APISO): 111 U/L (ref 35–144)
BUN: 16 mg/dL (ref 7–25)
CO2: 27 mmol/L (ref 20–32)
Calcium: 9.3 mg/dL (ref 8.6–10.3)
Chloride: 102 mmol/L (ref 98–110)
Creat: 0.92 mg/dL (ref 0.70–1.30)
Globulin: 2.2 g/dL (ref 1.9–3.7)
Glucose, Bld: 97 mg/dL (ref 65–99)
Potassium: 4.4 mmol/L (ref 3.5–5.3)
Sodium: 138 mmol/L (ref 135–146)
Total Bilirubin: 2.1 mg/dL — ABNORMAL HIGH (ref 0.2–1.2)
Total Protein: 6.2 g/dL (ref 6.1–8.1)

## 2023-10-06 LAB — HIV-1 RNA QUANT-NO REFLEX-BLD
HIV 1 RNA Quant: <20 {copies}/mL — AB
HIV-1 RNA Quant, Log: <1.3 {Log_copies}/mL — AB

## 2023-10-06 LAB — LIPID PANEL
Cholesterol: 174 mg/dL (ref <200)
HDL: 86 mg/dL (ref >=40)
LDL Cholesterol (Calc): 73 mg/dL
Non-HDL Cholesterol (Calc): 88 mg/dL (ref <130)
Total CHOL/HDL Ratio: 2 (calc) (ref <5.0)
Triglycerides: 69 mg/dL (ref <150)

## 2023-10-06 LAB — RPR: RPR Ser Ql: NONREACTIVE

## 2023-10-13 ENCOUNTER — Telehealth: Payer: Self-pay

## 2023-10-13 NOTE — Telephone Encounter (Signed)
 Patient is requesting a referral for cardiology due to a family history of heart disease . [name], CMA

## 2023-11-04 NOTE — Progress Notes (Signed)
 The 10-year ASCVD risk score (Arnett DK, et al., 2019) is: 4.3%   Values used to calculate the score:     Age: 58 years     Sex: Male     Is Non-Hispanic African American: No     Diabetic: No     Tobacco smoker: No     Systolic Blood Pressure: 120 mmHg     Is BP treated: Yes     HDL Cholesterol: 86 mg/dL     Total Cholesterol: 174 mg/dL  Currently prescribed atorvastatin  10 mg.  Janyce Ellinger, BSN, RN

## 2023-11-10 ENCOUNTER — Telehealth: Payer: Self-pay | Admitting: Internal Medicine

## 2023-11-10 NOTE — Telephone Encounter (Signed)
 Patient called and stated that he was needing an appointment for a growth on his abdomin on his right side. Patient advise we had to call [facility] to schedule in appointment in order for him to have transportation. I called and left  a VM to schedule appointment for the patient. Please advise.

## 2023-12-22 ENCOUNTER — Ambulatory Visit: Admitting: Physician Assistant
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
## 2024-01-30 ENCOUNTER — Ambulatory Visit: Admitting: Infectious Disease

## 2024-01-30 DIAGNOSIS — F1020 Alcohol dependence, uncomplicated: Secondary | ICD-10-CM

## 2024-01-30 DIAGNOSIS — F331 Major depressive disorder, recurrent, moderate: Secondary | ICD-10-CM

## 2024-01-30 DIAGNOSIS — E785 Hyperlipidemia, unspecified: Secondary | ICD-10-CM

## 2024-01-30 DIAGNOSIS — B20 Human immunodeficiency virus [HIV] disease: Secondary | ICD-10-CM

## 2024-02-11 ENCOUNTER — Other Ambulatory Visit: Payer: Self-pay

## 2024-02-11 MED ORDER — HYDROCORTISONE 0.5 % EX CREA
1.0000 | TOPICAL_CREAM | Freq: Two times a day (BID) | CUTANEOUS | 0 refills | Status: AC
  Filled 2024-02-11: qty 56.8, 28d supply, fill #0

## 2024-02-14 ENCOUNTER — Other Ambulatory Visit: Payer: Self-pay

## 2024-02-15 ENCOUNTER — Other Ambulatory Visit: Payer: Self-pay

## 2024-02-20 ENCOUNTER — Other Ambulatory Visit: Payer: Self-pay

## 2024-02-21 ENCOUNTER — Other Ambulatory Visit: Payer: Self-pay

## 2024-05-04 ENCOUNTER — Other Ambulatory Visit (INDEPENDENT_AMBULATORY_CARE_PROVIDER_SITE_OTHER)

## 2024-05-04 ENCOUNTER — Ambulatory Visit (INDEPENDENT_AMBULATORY_CARE_PROVIDER_SITE_OTHER): Admitting: Internal Medicine

## 2024-05-04 ENCOUNTER — Encounter: Payer: Self-pay | Admitting: Internal Medicine

## 2024-05-04 VITALS — BP 130/80 | HR 69 | Ht 72.0 in | Wt 337.1 lb

## 2024-05-04 DIAGNOSIS — D500 Iron deficiency anemia secondary to blood loss (chronic): Secondary | ICD-10-CM

## 2024-05-04 DIAGNOSIS — K7682 Hepatic encephalopathy: Secondary | ICD-10-CM

## 2024-05-04 DIAGNOSIS — K7031 Alcoholic cirrhosis of liver with ascites: Secondary | ICD-10-CM

## 2024-05-04 DIAGNOSIS — K31819 Angiodysplasia of stomach and duodenum without bleeding: Secondary | ICD-10-CM

## 2024-05-04 DIAGNOSIS — L299 Pruritus, unspecified: Secondary | ICD-10-CM

## 2024-05-04 DIAGNOSIS — R161 Splenomegaly, not elsewhere classified: Secondary | ICD-10-CM | POA: Diagnosis not present

## 2024-05-04 DIAGNOSIS — I8510 Secondary esophageal varices without bleeding: Secondary | ICD-10-CM

## 2024-05-04 LAB — CBC WITH DIFFERENTIAL/PLATELET
Basophils Absolute: 0.1 K/uL (ref 0.0–0.1)
Basophils Relative: 1 % (ref 0.0–3.0)
Eosinophils Absolute: 0.6 K/uL (ref 0.0–0.7)
Eosinophils Relative: 8.9 % — ABNORMAL HIGH (ref 0.0–5.0)
HCT: 47.9 % (ref 39.0–52.0)
Hemoglobin: 16.4 g/dL (ref 13.0–17.0)
Lymphocytes Relative: 18 % (ref 12.0–46.0)
Lymphs Abs: 1.2 K/uL (ref 0.7–4.0)
MCHC: 34.2 g/dL (ref 30.0–36.0)
MCV: 96.2 fl (ref 78.0–100.0)
Monocytes Absolute: 0.7 K/uL (ref 0.1–1.0)
Monocytes Relative: 10 % (ref 3.0–12.0)
Neutro Abs: 4.2 K/uL (ref 1.4–7.7)
Neutrophils Relative %: 62.1 % (ref 43.0–77.0)
Platelets: 86 K/uL — ABNORMAL LOW (ref 150.0–400.0)
RBC: 4.98 Mil/uL (ref 4.22–5.81)
RDW: 14.8 % (ref 11.5–15.5)
WBC: 6.8 K/uL (ref 4.0–10.5)

## 2024-05-04 LAB — COMPREHENSIVE METABOLIC PANEL WITH GFR
ALT: 20 U/L (ref 0–53)
AST: 27 U/L (ref 0–37)
Albumin: 3.9 g/dL (ref 3.5–5.2)
Alkaline Phosphatase: 124 U/L — ABNORMAL HIGH (ref 39–117)
BUN: 11 mg/dL (ref 6–23)
CO2: 26 meq/L (ref 19–32)
Calcium: 9.3 mg/dL (ref 8.4–10.5)
Chloride: 102 meq/L (ref 96–112)
Creatinine, Ser: 0.89 mg/dL (ref 0.40–1.50)
GFR: 93.81 mL/min (ref >60.00)
Glucose, Bld: 85 mg/dL (ref 70–99)
Potassium: 4.1 meq/L (ref 3.5–5.1)
Sodium: 137 meq/L (ref 135–145)
Total Bilirubin: 2.3 mg/dL — ABNORMAL HIGH (ref 0.2–1.2)
Total Protein: 6.1 g/dL (ref 6.0–8.3)

## 2024-05-04 LAB — PROTIME-INR
INR: 1.3 ratio — ABNORMAL HIGH (ref 0.8–1.0)
Prothrombin Time: 13.3 s — ABNORMAL HIGH (ref 9.6–13.1)

## 2024-05-04 LAB — FERRITIN: Ferritin: 153.9 ng/mL (ref 22.0–322.0)
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
## 2024-05-04 NOTE — Patient Instructions (Signed)
 Your provider has requested that you go to the basement level for lab work before leaving today. Press B on the elevator. The lab is located at the first door on the left as you exit the elevator.  You have been scheduled for an abdominal ultrasound at [name] (1st floor of hospital) on 05/14/24 at 11:00am. Please arrive 30 minutes prior to your appointment for registration. Make certain not to have anything to eat or drink 6 hours prior to your appointment. Should you need to reschedule your appointment, please contact radiology at [phone]. This test typically takes about 30 minutes to perform.  You have been scheduled for an endoscopy. Please follow written instructions given to you at your visit today.  If you use inhalers (even only as needed), please bring them with you on the day of your procedure.  If you take any of the following medications, they will need to be adjusted prior to your procedure:   DO NOT TAKE 7 DAYS PRIOR TO TEST- Trulicity (dulaglutide) Ozempic, Wegovy (semaglutide) Mounjaro, Zepbound (tirzepatide) Bydureon Bcise (exanatide extended release)  DO NOT TAKE 1 DAY PRIOR TO YOUR TEST Rybelsus (semaglutide) Adlyxin (lixisenatide) Victoza (liraglutide) Byetta (exanatide) ___________________________________________________________________________  If your blood pressure at your visit was 140/90 or greater, please contact your primary care physician to follow up on this.  _______________________________________________________  If you are age 3 or older, your body mass index should be between 23-30. Your Body mass index is 45.72 kg/m. If this is out of the aforementioned range listed, please consider follow up with your Primary Care Provider.  If you are age 30 or younger, your body mass index should be between 19-25. Your Body mass index is 45.72 kg/m. If this is out of the aformentioned range listed, please consider follow up with your Primary  Care Provider.   ________________________________________________________  The [facility] providers would like to encourage you to use MYCHART to communicate with providers for non-urgent requests or questions.  Due to long hold times on the telephone, sending your provider a message by [name] may be a faster and more efficient way to get a response.  Please allow 48 business hours for a response.  Please remember that this is for non-urgent requests.  _______________________________________________________  [name] is using a team-based approach to care.  Your team is made up of your doctor and two to three APPS. Our APPS (Nurse Practitioners and Physician Assistants) work with your physician to ensure care continuity for you. They are fully qualified to address your health concerns and develop a treatment plan. They communicate directly with your gastroenterologist to care for you. Seeing the Advanced Practice Practitioners on your physician's team can help you by facilitating care more promptly, often allowing for earlier appointments, access to diagnostic testing, procedures, and other specialty referrals.

## 2024-05-07 ENCOUNTER — Ambulatory Visit: Payer: Self-pay | Admitting: Internal Medicine

## 2024-05-07 LAB — AFP TUMOR MARKER: AFP-Tumor Marker: 3.3 ng/mL (ref <6.1)

## 2024-05-12 DIAGNOSIS — D696 Thrombocytopenia, unspecified: Secondary | ICD-10-CM | POA: Insufficient documentation

## 2024-05-12 DIAGNOSIS — K7460 Unspecified cirrhosis of liver: Secondary | ICD-10-CM | POA: Insufficient documentation

## 2024-05-13 DIAGNOSIS — R17 Unspecified jaundice: Secondary | ICD-10-CM | POA: Insufficient documentation

## 2024-05-13 DIAGNOSIS — Z7409 Other reduced mobility: Secondary | ICD-10-CM | POA: Insufficient documentation

## 2024-05-14 ENCOUNTER — Ambulatory Visit (HOSPITAL_COMMUNITY): Admission: RE | Admit: 2024-05-14 | Source: Ambulatory Visit

## 2024-05-14 DIAGNOSIS — R21 Rash and other nonspecific skin eruption: Secondary | ICD-10-CM | POA: Insufficient documentation

## 2024-05-15 DIAGNOSIS — I872 Venous insufficiency (chronic) (peripheral): Secondary | ICD-10-CM | POA: Insufficient documentation

## 2024-05-15 DIAGNOSIS — M79661 Pain in right lower leg: Secondary | ICD-10-CM | POA: Insufficient documentation

## 2024-05-17 ENCOUNTER — Ambulatory Visit: Admitting: Infectious Disease

## 2024-05-19 DIAGNOSIS — R600 Localized edema: Secondary | ICD-10-CM | POA: Insufficient documentation

## 2024-05-30 ENCOUNTER — Ambulatory Visit (HOSPITAL_COMMUNITY)

## 2024-06-26 ENCOUNTER — Telehealth: Payer: Self-pay | Admitting: Gastroenterology

## 2024-06-26 NOTE — Telephone Encounter (Signed)
 Procedure:Endoscopy Procedure date: 07/05/24 Procedure location: [facility] Arrival Time: 8:15 am Spoke with the patient Y/N:  No, I left a detailed message on [phone] on 06/26/24 @ 2:45 pm for the patient to return call.   No, called [phone] on 06/27/24  and left a message for pt to call back and called [phone] and whoever answered the phone stated Marcey said he would call back, I could here him in the background. No, I left a detailed message on [phone] on 06/27/24 @ 3:45 pm for the patient to return call.    Any prep concerns? No Has the patient obtained the prep from the pharmacy ? No prep needed Do you have a care partner and transportation: ___ Any additional concerns? ___

## 2024-06-29 NOTE — Telephone Encounter (Signed)
 PJ see note regarding case that was set up while in office- he was given all information when at the visit.

## 2024-07-05 ENCOUNTER — Encounter (HOSPITAL_COMMUNITY): Admission: RE | Payer: Self-pay | Source: Home / Self Care

## 2024-07-05 ENCOUNTER — Ambulatory Visit (HOSPITAL_COMMUNITY): Admission: RE | Admit: 2024-07-05 | Source: Home / Self Care | Admitting: Internal Medicine

## 2024-07-05 ENCOUNTER — Telehealth: Payer: Self-pay

## 2024-07-05 SURGERY — EGD (ESOPHAGOGASTRODUODENOSCOPY)
Anesthesia: Monitor Anesthesia Care

## 2024-07-05 NOTE — Telephone Encounter (Signed)
 Patient called with concerns for rash on leg, chest, and abdomen that has progressively got worse. Is currently applying Eucerin cream on area with rash. Also c/o swelling of legs+ feet x 2 weeks.  Would like to discuss concerns at appointment on 1/29. Lorenda CHRISTELLA Code, RMA
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
## 2024-07-12 ENCOUNTER — Other Ambulatory Visit: Payer: Self-pay

## 2024-07-12 ENCOUNTER — Ambulatory Visit: Admitting: Infectious Disease

## 2024-07-12 VITALS — BP 143/84 | HR 108 | Temp 98.1°F

## 2024-07-12 DIAGNOSIS — K7682 Hepatic encephalopathy: Secondary | ICD-10-CM

## 2024-07-12 DIAGNOSIS — R600 Localized edema: Secondary | ICD-10-CM

## 2024-07-12 DIAGNOSIS — R85619 Unspecified abnormal cytological findings in specimens from anus: Secondary | ICD-10-CM

## 2024-07-12 DIAGNOSIS — D696 Thrombocytopenia, unspecified: Secondary | ICD-10-CM | POA: Diagnosis not present

## 2024-07-12 DIAGNOSIS — K7030 Alcoholic cirrhosis of liver without ascites: Secondary | ICD-10-CM | POA: Diagnosis not present

## 2024-07-12 DIAGNOSIS — I8510 Secondary esophageal varices without bleeding: Secondary | ICD-10-CM

## 2024-07-12 DIAGNOSIS — Z79899 Other long term (current) drug therapy: Secondary | ICD-10-CM

## 2024-07-12 DIAGNOSIS — R21 Rash and other nonspecific skin eruption: Secondary | ICD-10-CM

## 2024-07-12 DIAGNOSIS — I872 Venous insufficiency (chronic) (peripheral): Secondary | ICD-10-CM

## 2024-07-12 DIAGNOSIS — L270 Generalized skin eruption due to drugs and medicaments taken internally: Secondary | ICD-10-CM | POA: Diagnosis not present

## 2024-07-12 DIAGNOSIS — F04 Amnestic disorder due to known physiological condition: Secondary | ICD-10-CM

## 2024-07-12 DIAGNOSIS — R17 Unspecified jaundice: Secondary | ICD-10-CM

## 2024-07-12 DIAGNOSIS — E669 Obesity, unspecified: Secondary | ICD-10-CM

## 2024-07-12 DIAGNOSIS — B20 Human immunodeficiency virus [HIV] disease: Secondary | ICD-10-CM | POA: Diagnosis present

## 2024-07-12 MED ORDER — PREDNISONE 20 MG PO TABS: 40.0000 mg | ORAL_TABLET | Freq: Every day | ORAL | 0 refills | Status: AC

## 2024-07-12 MED ORDER — DESCOVY 200-25 MG PO TABS: 1.0000 | ORAL_TABLET | Freq: Every day | ORAL | 11 refills | Status: AC

## 2024-07-12 MED ORDER — ATORVASTATIN CALCIUM 10 MG PO TABS: 10.0000 mg | ORAL_TABLET | Freq: Every day | ORAL | 11 refills | Status: AC

## 2024-07-12 MED ORDER — DORAVIRINE 100 MG PO TABS: 100.0000 mg | ORAL_TABLET | Freq: Every day | ORAL | 11 refills | Status: AC

## 2024-07-12 NOTE — Addendum Note (Signed)
 Addended by: GRETEL TULLY HERO on: 07/12/2024 01:26 PM   Modules accepted: Orders

## 2024-07-17 LAB — COMPLETE METABOLIC PANEL WITHOUT GFR
AG Ratio: 1.6 (calc) (ref 1.0–2.5)
ALT: 20 U/L (ref 9–46)
AST: 31 U/L (ref 10–35)
Albumin: 3.5 g/dL — ABNORMAL LOW (ref 3.6–5.1)
Alkaline phosphatase (APISO): 121 U/L (ref 35–144)
BUN: 11 mg/dL (ref 7–25)
CO2: 27 mmol/L (ref 20–32)
Calcium: 9.1 mg/dL (ref 8.6–10.3)
Chloride: 100 mmol/L (ref 98–110)
Creat: 1.1 mg/dL (ref 0.70–1.30)
Globulin: 2.2 g/dL (ref 1.9–3.7)
Glucose, Bld: 114 mg/dL — ABNORMAL HIGH (ref 65–99)
Potassium: 4.2 mmol/L (ref 3.5–5.3)
Sodium: 135 mmol/L (ref 135–146)
Total Bilirubin: 2 mg/dL — ABNORMAL HIGH (ref 0.2–1.2)
Total Protein: 5.7 g/dL — ABNORMAL LOW (ref 6.1–8.1)

## 2024-07-17 LAB — CBC WITH DIFFERENTIAL/PLATELET
Absolute Lymphocytes: 913 {cells}/uL (ref 850–3900)
Absolute Monocytes: 803 {cells}/uL (ref 200–950)
Basophils Absolute: 51 {cells}/uL (ref 0–200)
Basophils Relative: 0.7 %
Eosinophils Absolute: 1132 {cells}/uL — ABNORMAL HIGH (ref 15–500)
Eosinophils Relative: 15.5 %
HCT: 43.5 % (ref 39.4–51.1)
Hemoglobin: 14.8 g/dL (ref 13.2–17.1)
MCH: 32.2 pg (ref 27.0–33.0)
MCHC: 34 g/dL (ref 31.6–35.4)
MCV: 94.6 fL (ref 81.4–101.7)
MPV: 11.1 fL (ref 7.5–12.5)
Monocytes Relative: 11 %
Neutro Abs: 4402 {cells}/uL (ref 1500–7800)
Neutrophils Relative %: 60.3 %
Platelets: 127 10*3/uL — ABNORMAL LOW (ref 140–400)
RBC: 4.6 Million/uL (ref 4.20–5.80)
RDW: 13 % (ref 11.0–15.0)
Total Lymphocyte: 12.5 %
WBC: 7.3 10*3/uL (ref 3.8–10.8)

## 2024-07-17 LAB — LIPID PANEL
Cholesterol: 116 mg/dL
HDL: 55 mg/dL
LDL Cholesterol (Calc): 43 mg/dL
Non-HDL Cholesterol (Calc): 61 mg/dL
Total CHOL/HDL Ratio: 2.1 (calc)
Triglycerides: 97 mg/dL

## 2024-07-17 LAB — HIV-1 RNA QUANT-NO REFLEX-BLD
HIV 1 RNA Quant: NOT DETECTED {copies}/mL
HIV-1 RNA Quant, Log: NOT DETECTED {Log_copies}/mL

## 2024-07-17 LAB — SYPHILIS: RPR W/REFLEX TO RPR TITER AND TREPONEMAL ANTIBODIES, TRADITIONAL SCREENING AND DIAGNOSIS ALGORITHM: RPR Ser Ql: NONREACTIVE

## 2024-07-20 ENCOUNTER — Ambulatory Visit: Admitting: Internal Medicine
